# Patient Record
Sex: Female | Born: 1970 | Race: Black or African American | Hispanic: No | Smoking: Former smoker
Health system: Southern US, Community
[De-identification: ages and names within clinical notes are randomized; demographics above are authoritative.]

## PROBLEM LIST (undated history)

## (undated) DIAGNOSIS — I499 Cardiac arrhythmia, unspecified: Secondary | ICD-10-CM

## (undated) DIAGNOSIS — Z1509 Genetic susceptibility to other malignant neoplasm: Secondary | ICD-10-CM

## (undated) DIAGNOSIS — F32A Depression, unspecified: Secondary | ICD-10-CM

## (undated) DIAGNOSIS — D509 Iron deficiency anemia, unspecified: Secondary | ICD-10-CM

## (undated) DIAGNOSIS — Z973 Presence of spectacles and contact lenses: Secondary | ICD-10-CM

## (undated) DIAGNOSIS — R0602 Shortness of breath: Secondary | ICD-10-CM

## (undated) DIAGNOSIS — R21 Rash and other nonspecific skin eruption: Secondary | ICD-10-CM

## (undated) DIAGNOSIS — Z9889 Other specified postprocedural states: Secondary | ICD-10-CM

## (undated) DIAGNOSIS — Z8619 Personal history of other infectious and parasitic diseases: Secondary | ICD-10-CM

## (undated) DIAGNOSIS — Z9221 Personal history of antineoplastic chemotherapy: Secondary | ICD-10-CM

## (undated) DIAGNOSIS — Z9289 Personal history of other medical treatment: Secondary | ICD-10-CM

## (undated) DIAGNOSIS — F329 Major depressive disorder, single episode, unspecified: Secondary | ICD-10-CM

## (undated) DIAGNOSIS — R079 Chest pain, unspecified: Secondary | ICD-10-CM

## (undated) DIAGNOSIS — K219 Gastro-esophageal reflux disease without esophagitis: Secondary | ICD-10-CM

## (undated) DIAGNOSIS — N809 Endometriosis, unspecified: Secondary | ICD-10-CM

## (undated) DIAGNOSIS — I82409 Acute embolism and thrombosis of unspecified deep veins of unspecified lower extremity: Secondary | ICD-10-CM

## (undated) DIAGNOSIS — D219 Benign neoplasm of connective and other soft tissue, unspecified: Secondary | ICD-10-CM

## (undated) DIAGNOSIS — C50919 Malignant neoplasm of unspecified site of unspecified female breast: Secondary | ICD-10-CM

## (undated) DIAGNOSIS — D649 Anemia, unspecified: Secondary | ICD-10-CM

## (undated) DIAGNOSIS — I10 Essential (primary) hypertension: Secondary | ICD-10-CM

## (undated) DIAGNOSIS — Z1501 Genetic susceptibility to malignant neoplasm of breast: Secondary | ICD-10-CM

## (undated) DIAGNOSIS — R112 Nausea with vomiting, unspecified: Secondary | ICD-10-CM

## (undated) HISTORY — PX: CARDIAC CATHETERIZATION: SHX172

## (undated) HISTORY — DX: Malignant neoplasm of unspecified site of unspecified female breast: C50.919

## (undated) HISTORY — DX: Genetic susceptibility to other malignant neoplasm: Z15.09

## (undated) HISTORY — DX: Iron deficiency anemia, unspecified: D50.9

## (undated) HISTORY — PX: TUBAL LIGATION: SHX77

## (undated) HISTORY — DX: Essential (primary) hypertension: I10

## (undated) HISTORY — DX: Genetic susceptibility to malignant neoplasm of breast: Z15.01

## (undated) HISTORY — PX: MASTECTOMY: SHX3

---

## 2008-12-03 HISTORY — PX: CERVICAL POLYPECTOMY: SHX88

## 2012-06-09 ENCOUNTER — Emergency Department (HOSPITAL_COMMUNITY)
Admission: EM | Admit: 2012-06-09 | Discharge: 2012-06-09 | Disposition: A | Discharge status: Home / Self Care | Payer: Self-pay | Attending: Emergency Medicine | Admitting: Emergency Medicine

## 2012-06-09 ENCOUNTER — Encounter (HOSPITAL_COMMUNITY): Payer: Self-pay | Admitting: *Deleted

## 2012-06-09 ENCOUNTER — Emergency Department (HOSPITAL_COMMUNITY): Payer: Self-pay

## 2012-06-09 DIAGNOSIS — Z111 Encounter for screening for respiratory tuberculosis: Secondary | ICD-10-CM | POA: Insufficient documentation

## 2012-06-09 NOTE — ED Notes (Signed)
She had had a pos tb test and her job in [place] told her to come here for a chest xray

## 2012-06-09 NOTE — ED Provider Notes (Signed)
History     CSN: [id number]  Arrival date & time 06/09/12  2158   First MD Initiated Contact with Patient 06/09/12 2337      Chief Complaint  Patient presents with  . needs chest xray     (Consider location/radiation/quality/duration/timing/severity/associated sxs/prior treatment) HPI Comments: Patient had a screening PPD placed at her job is read today as positive.  It is minimally red non-raised.  She has had no cough, no night sweats.  Has not traveled outside of the country, but they insisted she come to the emergency room tonight for chest x-ray to rule out tuberculosis  The history is provided by the patient.    History reviewed. No pertinent past medical history.  History reviewed. No pertinent past surgical history.  No family history on file.  History  Substance Use Topics  . Smoking status: Never Smoker   . Smokeless tobacco: Not on file  . Alcohol Use: No    OB History    Grav Para Term Preterm Abortions TAB SAB Ect Mult Living                  Review of Systems  Constitutional: Negative for fever and chills.  Respiratory: Negative for cough and shortness of breath.   Neurological: Negative for dizziness.    Allergies  Review of patient's allergies indicates no known allergies.  Home Medications  No current outpatient prescriptions on file.  BP 118/84  Pulse 70  Temp 98.1 F (36.7 C) (Oral)  Resp 18  SpO2 100%  LMP 05/28/2012  Physical Exam  Constitutional: She appears well-developed and well-nourished.  HENT:  Head: Normocephalic.  Eyes: Pupils are equal, round, and reactive to light.  Neck: Normal range of motion.  Cardiovascular: Normal rate.   Pulmonary/Chest: Effort normal.  Musculoskeletal: Normal range of motion.  Neurological: She is alert.  Skin: Skin is warm.       Minimally P., 1 cm, circular nonraised area at the site of the PPD test    ED Course  Procedures (including critical care time)   Labs Reviewed  PREGNANCY,  URINE  POCT PREGNANCY, URINE   Dg Chest 1 View  06/09/2012  *RADIOLOGY REPORT*  Clinical Data: Positive TB test  CHEST - 1 VIEW  Comparison: None.  Findings: Upper normal heart size.  Clear lungs.  No pleural effusion.  IMPRESSION: No active cardiopulmonary disease.  Original Report Authenticated By: Donavan Burnet, M.D.     1. Screening for tuberculosis       MDM   Chest x-ray to rule out tuberculosis with a minimally positive PPD per her employer        Arman Filter, NP 06/09/12 2342  Arman Filter, NP 06/09/12 2342

## 2012-06-10 NOTE — ED Provider Notes (Signed)
Medical screening examination/treatment/procedure(s) were performed by non-physician practitioner and as supervising physician I was immediately available for consultation/collaboration.    Evalina Tabak D Francisca Langenderfer, MD 06/10/12 0730 

## 2012-09-11 ENCOUNTER — Emergency Department (HOSPITAL_BASED_OUTPATIENT_CLINIC_OR_DEPARTMENT_OTHER)
Admission: EM | Admit: 2012-09-11 | Discharge: 2012-09-11 | Disposition: A | Discharge status: Home / Self Care | Payer: Self-pay | Attending: Emergency Medicine | Admitting: Emergency Medicine

## 2012-09-11 ENCOUNTER — Encounter (HOSPITAL_BASED_OUTPATIENT_CLINIC_OR_DEPARTMENT_OTHER): Payer: Self-pay | Admitting: *Deleted

## 2012-09-11 DIAGNOSIS — L02419 Cutaneous abscess of limb, unspecified: Secondary | ICD-10-CM | POA: Insufficient documentation

## 2012-09-11 DIAGNOSIS — L0291 Cutaneous abscess, unspecified: Secondary | ICD-10-CM

## 2012-09-11 MED ORDER — HYDROCODONE-ACETAMINOPHEN 5-325 MG PO TABS: 2.0000 | ORAL_TABLET | ORAL | Status: AC | PRN

## 2012-09-11 MED ORDER — DOXYCYCLINE HYCLATE 100 MG PO CAPS: 100.0000 mg | ORAL_CAPSULE | Freq: Two times a day (BID) | ORAL | Status: DC

## 2012-09-11 NOTE — ED Provider Notes (Signed)
Medical screening examination/treatment/procedure(s) were performed by non-physician practitioner and as supervising physician I was immediately available for consultation/collaboration.   Glynn Octave, MD 09/11/12 [phone]

## 2012-09-11 NOTE — ED Notes (Signed)
Abscess to her right upper anterior leg x 2 days. Red, swollen, painful and hot to touch.

## 2012-09-11 NOTE — ED Provider Notes (Signed)
History     CSN: [id number]  Arrival date & time 09/11/12  1825   First MD Initiated Contact with Patient 09/11/12 2024      Chief Complaint  Patient presents with  . Abscess    (Consider location/radiation/quality/duration/timing/severity/associated sxs/prior treatment) Patient is a 41 y.o. female presenting with rash. The history is provided by the patient. No language interpreter was used.  Rash  This is a new problem. The problem has not changed since onset.The rash is present on the right upper leg. The pain is moderate.  Pt complains of a red area to right upper leg.    History reviewed. No pertinent past medical history.  History reviewed. No pertinent past surgical history.  No family history on file.  History  Substance Use Topics  . Smoking status: Never Smoker   . Smokeless tobacco: Not on file  . Alcohol Use: No    OB History    Grav Para Term Preterm Abortions TAB SAB Ect Mult Living                  Review of Systems  Skin: Positive for rash.  All other systems reviewed and are negative.    Allergies  Review of patient's allergies indicates no known allergies.  Home Medications  No current outpatient prescriptions on file.  BP 146/79  Pulse 78  Temp 98.8 F (37.1 C) (Oral)  Resp 16  SpO2 99%  Physical Exam  Nursing note and vitals reviewed. Constitutional: She is oriented to person, place, and time. She appears well-developed and well-nourished.  Musculoskeletal: She exhibits tenderness.       3cm erythematous area right leg,  nonfluctuant  Neurological: She is alert and oriented to person, place, and time. She has normal reflexes.  Skin: There is erythema.  Psychiatric: She has a normal mood and affect.    ED Course  Procedures (including critical care time)  Labs Reviewed - No data to display No results found.   1. Abscess       MDM  Doxy cycline and hydrocodone        Lonia Skinner [place], [place] 09/11/12 2222

## 2013-12-20 ENCOUNTER — Emergency Department (HOSPITAL_BASED_OUTPATIENT_CLINIC_OR_DEPARTMENT_OTHER)
Admission: EM | Admit: 2013-12-20 | Discharge: 2013-12-20 | Disposition: A | Discharge status: Home / Self Care | Payer: Medicaid Other | Attending: Emergency Medicine | Admitting: Emergency Medicine

## 2013-12-20 ENCOUNTER — Encounter (HOSPITAL_BASED_OUTPATIENT_CLINIC_OR_DEPARTMENT_OTHER): Payer: Self-pay | Admitting: Emergency Medicine

## 2013-12-20 DIAGNOSIS — N6320 Unspecified lump in the left breast, unspecified quadrant: Secondary | ICD-10-CM

## 2013-12-20 DIAGNOSIS — N63 Unspecified lump in unspecified breast: Secondary | ICD-10-CM | POA: Insufficient documentation

## 2013-12-20 MED ORDER — OXYCODONE-ACETAMINOPHEN 5-325 MG PO TABS: 1.0000 | ORAL_TABLET | ORAL | Status: DC | PRN

## 2013-12-20 MED ORDER — CEPHALEXIN 500 MG PO CAPS: 500.0000 mg | ORAL_CAPSULE | Freq: Four times a day (QID) | ORAL | Status: DC

## 2013-12-20 NOTE — ED Notes (Addendum)
Patient here with left breast pain x 1 day. Reports that she can feel lump inside her breast. On assessment no redness or firmness noted

## 2013-12-20 NOTE — ED Provider Notes (Signed)
CSN: [id number]     Arrival date & time 12/20/13  1538 History   First MD Initiated Contact with Patient 12/20/13 1712     Chief Complaint  Patient presents with  . Breast Pain   (Consider location/radiation/quality/duration/timing/severity/associated sxs/prior Treatment) The history is provided by the patient.   Tamara [name] is a 43 y.o. female who presents to the ED with left breast pain that started last night. She felt the pain and then felt a knot. She also complains of pain in the left axilla. She has never had a mammogram. Patient's mother with hx of breast CA. Patient has no health problems. She denies fever or chills or any other problems.  History reviewed. No pertinent past medical history. Past Surgical History  Procedure Laterality Date  . Palups removed      2010   No family history on file. History  Substance Use Topics  . Smoking status: Never Smoker   . Smokeless tobacco: Not on file  . Alcohol Use: No   OB History   Grav Para Term Preterm Abortions TAB SAB Ect Mult Living                 Review of Systems Negative except as stated in HPI.   Allergies  Review of patient's allergies indicates no known allergies.  Home Medications   Current Outpatient Rx  Name  Route  Sig  Dispense  Refill  . cephALEXin (KEFLEX) 500 MG capsule   Oral   Take 1 capsule (500 mg total) by mouth 4 (four) times daily.   28 capsule   0   . oxyCODONE-acetaminophen (ROXICET) 5-325 MG per tablet   Oral   Take 1 tablet by mouth every 4 (four) hours as needed for severe pain.   16 tablet   0    BP 151/59  Pulse 86  Temp(Src) 99.4 F (37.4 C) (Oral)  Resp 18  Wt 195 lb (88.451 kg)  SpO2 100% Physical Exam  Nursing note and vitals reviewed. Constitutional: She is oriented to person, place, and time. She appears well-developed and well-nourished. No distress.  HENT:  Head: Normocephalic and atraumatic.  Eyes: EOM are normal.  Neck: Neck supple.  Cardiovascular:  Normal rate.   Pulmonary/Chest: Effort normal. Left breast exhibits mass and tenderness. Left breast exhibits no inverted nipple, no nipple discharge and no skin change.    4 cm x 2 cm tender, firm mass palpated in the left breast. Tenderness of left axilla. No erythema noted.  Abdominal: Soft. There is no tenderness.  Musculoskeletal: Normal range of motion.  Neurological: She is alert and oriented to person, place, and time. No cranial nerve deficit.  Skin: Skin is warm and dry.  Psychiatric: She has a normal mood and affect. Her behavior is normal.    ED Course  Procedures  MDM  43 y.o. female with left breast mass, axilla tenderness. Will have her call [facility] tomorrow to be seen in the acute care clinic and she will call [facility] for mammogram. Discussed with the patient clinical findings and plan of care and all questioned fully answered. Will start antibiotics in the event that the area is an abscess. She will return if any problems arise.    Medication List         cephALEXin 500 MG capsule  Commonly known as:  KEFLEX  Take 1 capsule (500 mg total) by mouth 4 (four) times daily.     oxyCODONE-acetaminophen 5-325 MG  per tablet  Commonly known as:  ROXICET  Take 1 tablet by mouth every 4 (four) hours as needed for severe pain.           Ashley Murrain, NP 12/21/13 1728

## 2013-12-20 NOTE — Discharge Instructions (Signed)
Call [facility] tomorrow. Tell them you were seen in the ED and need to come to their work in clinic. Take the medications as directed. Do not take the narcotic if you are driving as it will make you sleepy. Return here as needed.

## 2013-12-21 ENCOUNTER — Ambulatory Visit: Payer: Medicaid Other | Attending: Internal Medicine | Admitting: Internal Medicine

## 2013-12-21 ENCOUNTER — Encounter: Payer: Self-pay | Admitting: Internal Medicine

## 2013-12-21 VITALS — BP 148/84 | HR 85 | Temp 98.9°F | Resp 14 | Ht 65.0 in | Wt 195.6 lb

## 2013-12-21 DIAGNOSIS — N63 Unspecified lump in unspecified breast: Secondary | ICD-10-CM

## 2013-12-21 LAB — CBC WITH DIFFERENTIAL/PLATELET
BASOS ABS: 0 10*3/uL (ref 0.0–0.1)
BASOS PCT: 0 % (ref 0–1)
Eosinophils Absolute: 0.1 10*3/uL (ref 0.0–0.7)
Eosinophils Relative: 1 % (ref 0–5)
HEMATOCRIT: 27.5 % — AB (ref 36.0–46.0)
Hemoglobin: 8 g/dL — ABNORMAL LOW (ref 12.0–15.0)
LYMPHS PCT: 27 % (ref 12–46)
Lymphs Abs: 2.2 10*3/uL (ref 0.7–4.0)
MCH: 17.9 pg — ABNORMAL LOW (ref 26.0–34.0)
MCHC: 29.1 g/dL — AB (ref 30.0–36.0)
MCV: 61.7 fL — ABNORMAL LOW (ref 78.0–100.0)
Monocytes Absolute: 0.6 10*3/uL (ref 0.1–1.0)
Monocytes Relative: 7 % (ref 3–12)
NEUTROS ABS: 5.1 10*3/uL (ref 1.7–7.7)
NEUTROS PCT: 65 % (ref 43–77)
PLATELETS: 425 10*3/uL — AB (ref 150–400)
RBC: 4.46 MIL/uL (ref 3.87–5.11)
RDW: 19 % — AB (ref 11.5–15.5)
WBC: 8 10*3/uL (ref 4.0–10.5)

## 2013-12-21 NOTE — Progress Notes (Signed)
Patient ID: Tamara [name], female   DOB: 02-12-1971, 43 y.o.   MRN: [id number]   CC:  HPI: 43 year old female presents with a lump in the outer quadrant of her left breast. She has noted this for the last 2 months. States that it is painful, with pain radiating into the left axilla. She has a strong positive family history of breast cancer   No Known Allergies History reviewed. No pertinent past medical history. Current Outpatient Prescriptions on File Prior to Visit  Medication Sig Dispense Refill  . cephALEXin (KEFLEX) 500 MG capsule Take 1 capsule (500 mg total) by mouth 4 (four) times daily.  28 capsule  0  . oxyCODONE-acetaminophen (ROXICET) 5-325 MG per tablet Take 1 tablet by mouth every 4 (four) hours as needed for severe pain.  16 tablet  0   No current facility-administered medications on file prior to visit.   History reviewed. No pertinent family history. History   Social History  . Marital Status: Single    Spouse Name: N/A    Number of Children: N/A  . Years of Education: N/A   Occupational History  . Not on file.   Social History Main Topics  . Smoking status: Never Smoker   . Smokeless tobacco: Not on file  . Alcohol Use: No  . Drug Use: No  . Sexual Activity: Not on file   Other Topics Concern  . Not on file   Social History Narrative  . No narrative on file    Review of Systems  Constitutional: Negative for fever, chills, diaphoresis, activity change, appetite change and fatigue.  HENT: Negative for ear pain, nosebleeds, congestion, facial swelling, rhinorrhea, neck pain, neck stiffness and ear discharge.   Eyes: Negative for pain, discharge, redness, itching and visual disturbance.  Respiratory: Negative for cough, choking, chest tightness, shortness of breath, wheezing and stridor.   Cardiovascular: Negative for chest pain, palpitations and leg swelling.  Gastrointestinal: Negative for abdominal distention.  Genitourinary: Negative for dysuria,  urgency, frequency, hematuria, flank pain, decreased urine volume, difficulty urinating and dyspareunia.  Musculoskeletal: Negative for back pain, joint swelling, arthralgias and gait problem.  Neurological: Negative for dizziness, tremors, seizures, syncope, facial asymmetry, speech difficulty, weakness, light-headedness, numbness and headaches.  Hematological: Negative for adenopathy. Does not bruise/bleed easily.  Psychiatric/Behavioral: Negative for hallucinations, behavioral problems, confusion, dysphoric mood, decreased concentration and agitation.    Objective:   Filed Vitals:   12/21/13 1148  BP: 148/84  Pulse: 85  Temp: 98.9 F (37.2 C)  Resp: 14    Physical Exam  Constitutional: Appears well-developed and well-nourished. No distress.  HENT: Normocephalic. External right and left ear normal. Oropharynx is clear and moist.  Eyes: Conjunctivae and EOM are normal. PERRLA, no scleral icterus.  Neck: Normal ROM. Neck supple. No JVD. No tracheal deviation. No thyromegaly.  CVS: RRR, S1/S2 +, no murmurs, no gallops, no carotid bruit.  Pulmonary: Effort and breath sounds normal, no stridor, rhonchi, wheezes, rales.  Abdominal: Soft. BS +,  no distension, tenderness, rebound or guarding.  Musculoskeletal: Normal range of motion. No edema and no tenderness.  Lymphadenopathy: No lymphadenopathy noted, cervical, inguinal. Neuro: Alert. Normal reflexes, muscle tone coordination. No cranial nerve deficit. Skin: Skin is warm and dry. No rash noted. Not diaphoretic. No erythema. No pallor.  Psychiatric: Normal mood and affect. Behavior, judgment, thought content normal.   No results found for this basename: WBC, HGB, HCT, MCV, PLT   No results found for this basename: CREATININE, BUN, NA, K,  CL, CO2    No results found for this basename: HGBA1C   Lipid Panel  No results found for this basename: chol, trig, hdl, cholhdl, vldl, ldlcalc       Assessment and plan:   There are no  active problems to display for this patient.  Breast lump In the patient for a stat mammogram, surgical consultation CBC to rule out abscess Follow up in 2 weeks      The patient was given clear instructions to go to ER or return to medical center if symptoms don't improve, worsen or new problems develop. The patient verbalized understanding. The patient was told to call to get any lab results if not heard anything in the next week.

## 2013-12-21 NOTE — Progress Notes (Signed)
Pt is here to establish care and a breast exam. May have a lump on Lf breast. Wants to be checked out. Slight pain under Lf armpit and side of Lt breast.

## 2013-12-22 MED ORDER — FERROUS SULFATE 325 (65 FE) MG PO TABS: 325.0000 mg | ORAL_TABLET | Freq: Two times a day (BID) | ORAL | Status: DC

## 2013-12-22 NOTE — Addendum Note (Signed)
Addended by: Allyson Sabal MD, Ascencion Dike on: 12/22/2013 11:27 AM   Modules accepted: Orders

## 2013-12-22 NOTE — Addendum Note (Signed)
Addended by: Allyson Sabal MD, Ascencion Dike on: 12/22/2013 11:26 AM   Modules accepted: Orders

## 2013-12-23 ENCOUNTER — Other Ambulatory Visit: Payer: Self-pay | Admitting: Internal Medicine

## 2013-12-23 ENCOUNTER — Telehealth: Payer: Self-pay | Admitting: *Deleted

## 2013-12-23 DIAGNOSIS — N63 Unspecified lump in unspecified breast: Secondary | ICD-10-CM

## 2013-12-23 DIAGNOSIS — N644 Mastodynia: Secondary | ICD-10-CM

## 2013-12-23 NOTE — Telephone Encounter (Signed)
Pt is scheduled for lab work 12/24/13 at 9:15am.

## 2013-12-23 NOTE — Telephone Encounter (Signed)
Message copied by Roberts Bon, [place] R on Wed Dec 23, 2013  9:34 AM ------      Message from: Allyson Sabal MD, [facility]      Created: Tue Dec 22, 2013 11:28 AM       Notify patient of the patient is fairly anemic, she needs to return for lab work he immediately, lab work has been scheduled.      4 anemia the patient should start taking ferrous sulfate which has been called into clinic pharmacy ------

## 2013-12-24 ENCOUNTER — Ambulatory Visit: Payer: Medicaid Other | Attending: Internal Medicine

## 2013-12-24 DIAGNOSIS — R7989 Other specified abnormal findings of blood chemistry: Secondary | ICD-10-CM

## 2013-12-24 LAB — ANEMIA PANEL
ABS RETIC: 65.4 10*3/uL (ref 19.0–186.0)
FOLATE: 13.4 ng/mL
Iron: <10 ug/dL — ABNORMAL LOW (ref 42–145)
RBC.: 4.36 MIL/uL (ref 3.87–5.11)
RETIC CT PCT: 1.5 % (ref 0.4–2.3)
UIBC: 428 ug/dL — ABNORMAL HIGH (ref 125–400)
Vitamin B-12: 478 pg/mL (ref 211–911)

## 2013-12-26 ENCOUNTER — Telehealth: Payer: Self-pay | Admitting: Emergency Medicine

## 2013-12-26 NOTE — Telephone Encounter (Signed)
Left message for pt to call clinic for test results

## 2013-12-26 NOTE — Telephone Encounter (Signed)
Message copied by Ricci Barker on Sat Dec 26, 2013 10:05 AM ------      Message from: Tresa Garter      Created: Fri Dec 25, 2013  4:52 PM       Please inform patient that her anemia panel shows significant iron deficiency. She will need to be on iron supplement.      Please call in prescription for patient: Ferrous sulfate 325 mg tablet by mouth 2 times a day 180 tablets with 3 refills ------

## 2013-12-29 NOTE — ED Provider Notes (Signed)
History/physical exam/procedure(s) were performed by non-physician practitioner and as supervising physician I was immediately available for consultation/collaboration. I have reviewed all notes and am in agreement with care and plan.   Shaune Pollack, MD 12/29/13 [phone]

## 2013-12-30 ENCOUNTER — Other Ambulatory Visit: Payer: Self-pay

## 2014-01-04 ENCOUNTER — Other Ambulatory Visit (HOSPITAL_COMMUNITY): Payer: Self-pay | Admitting: *Deleted

## 2014-01-04 DIAGNOSIS — N6320 Unspecified lump in the left breast, unspecified quadrant: Secondary | ICD-10-CM

## 2014-01-05 ENCOUNTER — Ambulatory Visit (HOSPITAL_COMMUNITY): Payer: Self-pay

## 2014-01-08 ENCOUNTER — Other Ambulatory Visit: Payer: Self-pay

## 2014-01-12 ENCOUNTER — Encounter (HOSPITAL_COMMUNITY): Payer: Self-pay

## 2014-01-12 ENCOUNTER — Ambulatory Visit (HOSPITAL_COMMUNITY)
Admission: RE | Admit: 2014-01-12 | Discharge: 2014-01-12 | Disposition: A | Discharge status: Home / Self Care | Payer: Medicaid Other | Source: Ambulatory Visit | Attending: Obstetrics and Gynecology | Admitting: Obstetrics and Gynecology

## 2014-01-12 VITALS — BP 140/100 | Temp 98.6°F | Ht 65.5 in | Wt 192.9 lb

## 2014-01-12 DIAGNOSIS — Z1239 Encounter for other screening for malignant neoplasm of breast: Secondary | ICD-10-CM

## 2014-01-12 DIAGNOSIS — N6321 Unspecified lump in the left breast, upper outer quadrant: Secondary | ICD-10-CM | POA: Insufficient documentation

## 2014-01-12 HISTORY — DX: Endometriosis, unspecified: N80.9

## 2014-01-12 NOTE — Patient Instructions (Signed)
Taught Jacqulynn Shappell how to perform BSE and gave educational materials to take home. Patient did not need a Pap smear today due to last Pap smear was in August 2014 per patient. Let her know [facility] will cover Pap smears every 3 years unless has a history of abnormal Pap smears. Referred patient to the [facility] for diagnostic mammogram. Appointment scheduled for Monday, January 18, 2014 at 1415. Patient aware of appointment and will be there. Chellsea Beckers verbalized understanding.  Avaiah Stempel, Arvil Chaco, RN 1:56 PM

## 2014-01-12 NOTE — Progress Notes (Signed)
Complaints of left breast lump x several months that is painful at times. Patient rated pain at a 3 out of 10.  Pap Smear:  Pap smear not completed today. Last Pap smear was August 2014 at [place] and normal per patient. Per patient has no history of an abnormal Pap smear. No Pap smear results in EPIC.  Physical exam: Breasts Breasts symmetrical. No skin abnormalities bilateral breasts. No nipple retraction bilateral breasts. No nipple discharge bilateral breasts. No lymphadenopathy. No lumps palpated right breast. Palpated a lump within the left breast at 2 o'clock 4 cm from the areola. No complaints of pain or tenderness on exam. Referred patient to the [facility] for diagnostic mammogram. Appointment scheduled for Monday, January 18, 2014 at 1415.   Pelvic/Bimanual No Pap smear completed today since last Pap smear was August 2014 per patient. Pap smear not indicated per [facility] guidelines.

## 2014-01-18 ENCOUNTER — Ambulatory Visit
Admission: RE | Admit: 2014-01-18 | Discharge: 2014-01-18 | Disposition: A | Discharge status: Home / Self Care | Payer: Medicaid Other | Source: Ambulatory Visit | Attending: Obstetrics and Gynecology | Admitting: Obstetrics and Gynecology

## 2014-01-18 ENCOUNTER — Other Ambulatory Visit (HOSPITAL_COMMUNITY): Payer: Self-pay | Admitting: Obstetrics and Gynecology

## 2014-01-18 DIAGNOSIS — N6320 Unspecified lump in the left breast, unspecified quadrant: Secondary | ICD-10-CM

## 2014-01-19 ENCOUNTER — Ambulatory Visit: Payer: Self-pay | Admitting: Internal Medicine

## 2014-01-20 ENCOUNTER — Other Ambulatory Visit: Payer: Self-pay

## 2014-01-25 ENCOUNTER — Telehealth: Payer: Self-pay | Admitting: Emergency Medicine

## 2014-01-25 ENCOUNTER — Telehealth: Payer: Self-pay | Admitting: Internal Medicine

## 2014-01-25 NOTE — Telephone Encounter (Signed)
Spoke with pt regarding mammogram results. Pt was informed of finding at time of visit but had some concerns regarding left axillary lymph node. Pt informed to wait until U/S with biopsy imaging

## 2014-01-25 NOTE — Telephone Encounter (Signed)
Pt is calling today to get some advice about the results of her breast exam; pt is worried and would like to speak to a physician about her exam; please advise on pt cell

## 2014-01-28 ENCOUNTER — Other Ambulatory Visit: Payer: Self-pay

## 2014-01-28 ENCOUNTER — Telehealth: Payer: Self-pay

## 2014-01-28 NOTE — Telephone Encounter (Signed)
Called the patient to see if she would be able to make the appointment the morning or if needed to reschedule due to weather. Per patient she would be able to come in the morning. Explained were to come and what would occur.

## 2014-01-29 ENCOUNTER — Ambulatory Visit (HOSPITAL_BASED_OUTPATIENT_CLINIC_OR_DEPARTMENT_OTHER): Payer: Medicaid Other

## 2014-01-29 ENCOUNTER — Other Ambulatory Visit (HOSPITAL_COMMUNITY): Payer: Self-pay | Admitting: Obstetrics and Gynecology

## 2014-01-29 ENCOUNTER — Ambulatory Visit
Admission: RE | Admit: 2014-01-29 | Discharge: 2014-01-29 | Disposition: A | Discharge status: Home / Self Care | Payer: Medicaid Other | Source: Ambulatory Visit | Attending: Obstetrics and Gynecology | Admitting: Obstetrics and Gynecology

## 2014-01-29 ENCOUNTER — Other Ambulatory Visit: Payer: Medicaid Other

## 2014-01-29 ENCOUNTER — Ambulatory Visit
Admission: RE | Admit: 2014-01-29 | Discharge: 2014-01-29 | Disposition: A | Discharge status: Home / Self Care | Payer: No Typology Code available for payment source | Source: Ambulatory Visit | Attending: Obstetrics and Gynecology | Admitting: Obstetrics and Gynecology

## 2014-01-29 ENCOUNTER — Telehealth: Payer: Self-pay | Admitting: Internal Medicine

## 2014-01-29 ENCOUNTER — Other Ambulatory Visit: Payer: Self-pay

## 2014-01-29 ENCOUNTER — Telehealth: Payer: Self-pay

## 2014-01-29 VITALS — BP 150/108 | HR 63 | Temp 98.7°F | Resp 20 | Ht 64.0 in | Wt 193.9 lb

## 2014-01-29 DIAGNOSIS — N6320 Unspecified lump in the left breast, unspecified quadrant: Secondary | ICD-10-CM

## 2014-01-29 DIAGNOSIS — Z0000 Encounter for general adult medical examination without abnormal findings: Secondary | ICD-10-CM

## 2014-01-29 LAB — GLUCOSE (CC13): GLUCOSE: 90 mg/dL (ref 70–140)

## 2014-01-29 LAB — LIPID PANEL
Cholesterol: 165 mg/dL (ref 0–200)
HDL: 65 mg/dL (ref >39)
LDL Cholesterol: 87 mg/dL (ref 0–99)
TRIGLYCERIDES: 65 mg/dL (ref <150)
Total CHOL/HDL Ratio: 2.5 Ratio
VLDL: 13 mg/dL (ref 0–40)

## 2014-01-29 LAB — HEMOGLOBIN A1C
Hgb A1c MFr Bld: 5 % (ref <5.7)
Mean Plasma Glucose: 97 mg/dL (ref <117)

## 2014-01-29 NOTE — Patient Instructions (Signed)
Discussed health assessment with patient. Informed patient that she would need to be followed up for blood pressure. She will be called with results of lab work and we will then discussed any further follow up the patient needs. Patient will implement behavior modifications to help lower BP.  Patient voiced interest in working on Nutrition and Activity. WIll f/u at [facility] for Bp and [facility] for Biopsy.Patient verbalized understanding.

## 2014-01-29 NOTE — Telephone Encounter (Signed)
Called patient to inform her that she had an appointment at the [facility] today(2/27) at 2:30 pm for her guided biopsy. She stated that she was glad and would be there.

## 2014-01-29 NOTE — Progress Notes (Signed)
Patient is a new patient to the [facility] program and is currently a [facility] patient effective 01/12/2014.   Clinical Measurements: Patient is 5 ft. 4 inches, weight 193.9 lbs, waist circumference 32 inches, and hip circumference 47 inches.   Medical History: Patient has no history of high cholesterol. Patient does not have a history of hypertension or diabetes. Per patient no diagnosed history of coronary heart disease, heart attack, heart failure, stroke/TIA, vascular disease or congenital heart defects.   Blood Pressure, Self-measurement: Patient states has no reason to check Blood pressure. 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  Nutrition Assessment: Patient stated that she has plenty of fruits in the house but she may eat it once a week.The children eat fruit every day. Patient states she eats  2 servings of vegetables a day. Patient stated that loves string beans. She does not eat 2 or more ounces of whole grains daily. Patient doesn't eat two or more servings of fish weekly.  Patient states she does drink more than 36 ounces or 450 calories of beverages with added sugars weekly. Patient stated she does not watch her salt intake.   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 Per patient does not go to mall because does most of her shopping on line. Patient stated that can not climb stairs or inclines without getting short of breath. This has been going on for the past 2 years.  Smoking Status: Patient states that she quit smoking about 4 to 5 years ago. Per patient was not a heavy smoker. Per patient is not around people who smoke in closed in areas.  Quality of Life Assessment: In assessing patient's quality of life she stated that out of the past 30 days that she has felt her health is not good all of them. Patient also stated that in the past 30 days that her mental  health is not good including stress, depression and problems with emotions for 30 days. Patients states that is stress do to lump in breast. The biopsy has been postpone twice due to weather. Patient did state that out of the past 30 days she felt her physical or mental health had not kept her from doing her usual activities including self-care, work or recreation. Patient voiced a lot of concern of being anxious and stressed over biopsy having been cancelled twice dur to weather.  Plan: Lab work will be done today including a lipid panel, blood glucose, and Hgb A1C. Will call lab results when they are finished. Patient will returned for Health Coaching and BP check. Will set up appointment for Health Coaching on 02/01/14.Will work on behavior modifications that we discussed to lower BP. Will work on methods to reduce stress that we discussed. Will schedule for an appointment at [facility] concerning blood pressure.  Will call if has any questions or concerns

## 2014-01-29 NOTE — Telephone Encounter (Signed)
Called pt and left a voicemail for pt to call and schedule an appointment with [facility], concerning her BP

## 2014-02-01 ENCOUNTER — Telehealth: Payer: Self-pay

## 2014-02-01 DIAGNOSIS — C50919 Malignant neoplasm of unspecified site of unspecified female breast: Secondary | ICD-10-CM

## 2014-02-01 HISTORY — DX: Malignant neoplasm of unspecified site of unspecified female breast: C50.919

## 2014-02-01 NOTE — Telephone Encounter (Signed)
Called and left message for her to return phone call so that I could give lab results to her.

## 2014-02-01 NOTE — Telephone Encounter (Signed)
Tamara [name] called back. Informed her that her results were normal. Told her that [facility] was trying to reach concerning an appointment about her Blood Pressure.  PLAN: Call back about Health Coaching for nutrition and activity that patient stated wanted to do.

## 2014-02-02 ENCOUNTER — Other Ambulatory Visit (INDEPENDENT_AMBULATORY_CARE_PROVIDER_SITE_OTHER): Payer: Self-pay | Admitting: Surgery

## 2014-02-02 ENCOUNTER — Telehealth: Payer: Self-pay | Admitting: *Deleted

## 2014-02-02 DIAGNOSIS — C50412 Malignant neoplasm of upper-outer quadrant of left female breast: Secondary | ICD-10-CM | POA: Insufficient documentation

## 2014-02-02 DIAGNOSIS — C50919 Malignant neoplasm of unspecified site of unspecified female breast: Secondary | ICD-10-CM

## 2014-02-02 DIAGNOSIS — Z171 Estrogen receptor negative status [ER-]: Secondary | ICD-10-CM

## 2014-02-02 NOTE — Telephone Encounter (Signed)
Confirmed [facility] for 02/10/14 at 8am .  Instructions and contact information given.

## 2014-02-04 ENCOUNTER — Ambulatory Visit: Payer: Medicaid Other | Attending: Internal Medicine

## 2014-02-04 MED ORDER — HYDROCHLOROTHIAZIDE 25 MG PO TABS: 25.0000 mg | ORAL_TABLET | Freq: Every day | ORAL | Status: DC

## 2014-02-04 NOTE — Patient Instructions (Signed)
Pt instructed to take new prescribed HCTZ 25 mg daily and return in 1 mnth for repeat bp

## 2014-02-07 ENCOUNTER — Ambulatory Visit
Admission: RE | Admit: 2014-02-07 | Discharge: 2014-02-07 | Disposition: A | Discharge status: Home / Self Care | Payer: No Typology Code available for payment source | Source: Ambulatory Visit | Attending: Surgery | Admitting: Surgery

## 2014-02-07 DIAGNOSIS — C50919 Malignant neoplasm of unspecified site of unspecified female breast: Secondary | ICD-10-CM

## 2014-02-07 MED ORDER — GADOBENATE DIMEGLUMINE 529 MG/ML IV SOLN
18.0000 mL | Freq: Once | INTRAVENOUS | Status: AC | PRN
  Administered 2014-02-07: 18 mL via INTRAVENOUS

## 2014-02-08 ENCOUNTER — Other Ambulatory Visit (INDEPENDENT_AMBULATORY_CARE_PROVIDER_SITE_OTHER): Payer: Self-pay | Admitting: Surgery

## 2014-02-08 DIAGNOSIS — R928 Other abnormal and inconclusive findings on diagnostic imaging of breast: Secondary | ICD-10-CM

## 2014-02-10 ENCOUNTER — Ambulatory Visit (HOSPITAL_BASED_OUTPATIENT_CLINIC_OR_DEPARTMENT_OTHER): Payer: Medicaid Other | Admitting: Oncology

## 2014-02-10 ENCOUNTER — Ambulatory Visit (HOSPITAL_BASED_OUTPATIENT_CLINIC_OR_DEPARTMENT_OTHER): Payer: Medicaid Other | Admitting: Surgery

## 2014-02-10 ENCOUNTER — Encounter (INDEPENDENT_AMBULATORY_CARE_PROVIDER_SITE_OTHER): Payer: Self-pay

## 2014-02-10 ENCOUNTER — Encounter: Payer: Self-pay | Admitting: Oncology

## 2014-02-10 ENCOUNTER — Ambulatory Visit: Payer: Medicaid Other | Attending: Surgery | Admitting: Physical Therapy

## 2014-02-10 ENCOUNTER — Encounter: Payer: Self-pay | Admitting: Radiation Oncology

## 2014-02-10 ENCOUNTER — Ambulatory Visit: Payer: Medicaid Other

## 2014-02-10 ENCOUNTER — Ambulatory Visit
Admission: RE | Admit: 2014-02-10 | Discharge: 2014-02-10 | Disposition: A | Discharge status: Home / Self Care | Payer: PRIVATE HEALTH INSURANCE | Source: Ambulatory Visit | Attending: Radiation Oncology | Admitting: Radiation Oncology

## 2014-02-10 ENCOUNTER — Other Ambulatory Visit (HOSPITAL_BASED_OUTPATIENT_CLINIC_OR_DEPARTMENT_OTHER): Payer: Medicaid Other

## 2014-02-10 ENCOUNTER — Encounter (INDEPENDENT_AMBULATORY_CARE_PROVIDER_SITE_OTHER): Payer: Self-pay | Admitting: Surgery

## 2014-02-10 VITALS — BP 134/84 | HR 80 | Temp 98.5°F | Resp 18 | Ht 64.0 in | Wt 194.3 lb

## 2014-02-10 DIAGNOSIS — Z803 Family history of malignant neoplasm of breast: Secondary | ICD-10-CM

## 2014-02-10 DIAGNOSIS — C50419 Malignant neoplasm of upper-outer quadrant of unspecified female breast: Secondary | ICD-10-CM

## 2014-02-10 DIAGNOSIS — C50412 Malignant neoplasm of upper-outer quadrant of left female breast: Secondary | ICD-10-CM

## 2014-02-10 DIAGNOSIS — R293 Abnormal posture: Secondary | ICD-10-CM | POA: Insufficient documentation

## 2014-02-10 DIAGNOSIS — C50919 Malignant neoplasm of unspecified site of unspecified female breast: Secondary | ICD-10-CM | POA: Insufficient documentation

## 2014-02-10 DIAGNOSIS — Z171 Estrogen receptor negative status [ER-]: Secondary | ICD-10-CM

## 2014-02-10 DIAGNOSIS — C773 Secondary and unspecified malignant neoplasm of axilla and upper limb lymph nodes: Secondary | ICD-10-CM

## 2014-02-10 DIAGNOSIS — IMO0001 Reserved for inherently not codable concepts without codable children: Secondary | ICD-10-CM | POA: Insufficient documentation

## 2014-02-10 LAB — CBC WITH DIFFERENTIAL/PLATELET
BASO%: 1.7 % (ref 0.0–2.0)
Basophils Absolute: 0.2 10*3/uL — ABNORMAL HIGH (ref 0.0–0.1)
EOS%: 1.1 % (ref 0.0–7.0)
Eosinophils Absolute: 0.1 10*3/uL (ref 0.0–0.5)
HCT: 27.7 % — ABNORMAL LOW (ref 34.8–46.6)
HGB: 8.3 g/dL — ABNORMAL LOW (ref 11.6–15.9)
LYMPH%: 23.9 % (ref 14.0–49.7)
MCH: 18.1 pg — AB (ref 25.1–34.0)
MCHC: 29.8 g/dL — AB (ref 31.5–36.0)
MCV: 60.6 fL — AB (ref 79.5–101.0)
MONO#: 0.9 10*3/uL (ref 0.1–0.9)
MONO%: 9.8 % (ref 0.0–14.0)
NEUT#: 5.9 10*3/uL (ref 1.5–6.5)
NEUT%: 63.5 % (ref 38.4–76.8)
PLATELETS: 487 10*3/uL — AB (ref 145–400)
RBC: 4.57 10*6/uL (ref 3.70–5.45)
RDW: 19.8 % — ABNORMAL HIGH (ref 11.2–14.5)
WBC: 9.2 10*3/uL (ref 3.9–10.3)
lymph#: 2.2 10*3/uL (ref 0.9–3.3)

## 2014-02-10 LAB — COMPREHENSIVE METABOLIC PANEL (CC13)
ALT: 11 U/L (ref 0–55)
AST: 17 U/L (ref 5–34)
Albumin: 4 g/dL (ref 3.5–5.0)
Alkaline Phosphatase: 65 U/L (ref 40–150)
Anion Gap: 10 mEq/L (ref 3–11)
BILIRUBIN TOTAL: 0.23 mg/dL (ref 0.20–1.20)
BUN: 20.5 mg/dL (ref 7.0–26.0)
CO2: 22 mEq/L (ref 22–29)
CREATININE: 0.9 mg/dL (ref 0.6–1.1)
Calcium: 9.5 mg/dL (ref 8.4–10.4)
Chloride: 106 mEq/L (ref 98–109)
Glucose: 90 mg/dl (ref 70–140)
Potassium: 4 mEq/L (ref 3.5–5.1)
Sodium: 137 mEq/L (ref 136–145)
TOTAL PROTEIN: 7.9 g/dL (ref 6.4–8.3)

## 2014-02-10 NOTE — Patient Instructions (Signed)
Implanted Port Insertion An implanted port is a central line that has a round shape and is placed under the skin. It is used as a long-term IV access for:   Medicines, such as chemotherapy.   Fluids.   Liquid nutrition, such as total parenteral nutrition (TPN).   Blood samples.  LET YOUR HEALTH CARE PROVIDER KNOW ABOUT:  Allergies to food or medicine.   Medicines taken, including vitamins, herbs, eye drops, creams, and over-the-counter medicines.   Any allergies to heparin.  Use of steroids (by mouth or creams).   Previous problems with anesthetics or numbing medicines.   History of bleeding problems or blood clots.   Previous surgery.   Other health problems, including diabetes and kidney problems.   Possibility of pregnancy, if this applies. RISKS AND COMPLICATIONS  Damage to the blood vessel, bruising, or bleeding at the puncture site.   Infection.  Blood clot in the vessel that the port is in.  Breakdown of the skin over your port.  Very rarely a person may develop a condition called a pneumothorax, a collection of air in the chest that may cause one of the lungs to collapse. The placement of these catheters with the appropriate imaging guidance significantly decreases the risk of a pneumothorax.  BEFORE THE PROCEDURE   Your health care provider may want you to have blood tests. These tests can help tell how well your kidneys and liver are working. They can also show how well your blood clots.   If you take blood thinners (anticoagulant medicines), ask your health care provider when you should stop taking them.   Make arrangements for someone to drive you home. This is necessary if you have been sedated for your procedure.  PROCEDURE  Port insertion usually takes about 30 45 minutes.   An IV needle will be inserted in your arm. Medicine for pain and medicine to help relax you (sedative) will flow directly into your body through this needle.    You will lie on an exam table, and you will be connected to monitors to keep track of your heart rate, blood pressure, and breathing throughout the procedure.  An oxygen monitoring device may be attached to your finger. Oxygen will be given.   Everything is kept as germ free (sterile) as possible during the procedure. The skin near the point of the incision will be cleaned with antiseptic, and the area will be draped with sterile towels. The skin and deeper tissues over the port area will be made numb with a local anesthetic.  Two small cuts (incisions) will be made in the skin to insert the port. One will be made in the neck to obtain access to the vein where the catheter will lie.   Because the port reservoir is placed under the skin, a small skin incision is made in the upper chest, and a small pocket for the port is made under the skin. The catheter connected to the port tunnels to a large central vein in the chest. A small, raised area remains on your body at the site of the reservoir when the procedure is complete.  The port placement is done under imaging guidance to ensure the proper placement.  The reservoir has a silicone covering that can be punctured with a special needle.   The port is flushed with normal saline, and blood is drawn to make sure it is working properly.  There is nothing remaining outside the skin when the procedure is finished.     Incisions are held together by stitches, surgical glue, or a special tape.  AFTER THE PROCEDURE  You will stay in a recovery area until the anesthesia has worn off. Your blood pressure and pulse will be checked.  A final chest X-ray is taken to check the placement of the port and that there is no injury to your lung.  If there are no problems, you should be able to go home after the procedure.  Document Released: 09/09/2013 Document Reviewed: 07/27/2013 [employer] Patient Information 2014 [employer]  

## 2014-02-10 NOTE — Progress Notes (Signed)
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 female who is seen out of the courtesy of Dr. Erroll Luna as part of the multidisciplinary breast clinic. Patient presented recently with palpable mass in the upper-outer aspect of her left breast. She sought out medical attention. A digital diagnostic bilateral mammogram and ultrasound was performed which revealed an ill-defined mass located within the upper-outer quadrant of the left breast. On ultrasound a hypoechoic mass was noted in the left breast, 2:00 position 7 cm from the nipple. This measured 3.2 x 2.6 x 2.0 cm. Results noted to be a level I left axillary lymph node with focal cortical thickening and increased vascularity  corresponding to a palpable lymph node. This measured 2.1 cm in size.   the patient proceeded to undergo needle core biopsy of the lesion within the 2:00 position of the left breast, 7 cm from the nipple. 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 A third biopsy was performed of a left axillary lymph node which showed metastatic mammary carcinoma. The tumors showed estrogen and progesterone receptors to be negative. Ki- 67 was elevated at 100%. The. There was HER-2/neu amplification at 4.65. With this information the patient is now seen for evaluation.Marland Kitchen  PREVIOUS RADIATION THERAPY: No  PAST  MEDICAL HISTORY:  has a past medical history of Endometriosis and Hypertension.    PAST SURGICAL HISTORY: Past Surgical History  Procedure Laterality Date  . Palups removed      2010  . Cervical polypectomy    . Cesarean section      one previous  . Tubal ligation      FAMILY HISTORY: family history includes Breast cancer in her mother; Diabetes in her father.  SOCIAL HISTORY:  reports that she has never smoked. She has never used smokeless tobacco. She reports that she does not drink alcohol or use illicit drugs.  ALLERGIES: Review of patient's allergies indicates no known allergies.  MEDICATIONS:  Current Outpatient Prescriptions  Medication Sig Dispense Refill  . cephALEXin (KEFLEX) 500 MG capsule Take 1 capsule (500 mg total) by mouth 4 (four) times daily.  28 capsule  0  . ferrous sulfate 325 (65 FE) MG tablet Take 1 tablet (325 mg total) by mouth 2 (two) times daily with a meal.  60 tablet  3  . hydrochlorothiazide (HYDRODIURIL) 25 MG tablet Take 1 tablet (25 mg total) by mouth daily.  30 tablet  2  . oxyCODONE-acetaminophen (ROXICET) 5-325 MG per tablet Take 1 tablet by mouth every 4 (four) hours as needed for severe pain.  16 tablet  0   No current facility-administered medications for this encounter.    REVIEW OF SYSTEMS:  A 15 point review of systems is documented in the electronic medical record. This was obtained by the nursing staff. However, I reviewed this with the patient to discuss relevant findings and make appropriate changes.  The patient  palpated the area of concern on self examination. She denied any nipple discharge or bleeding prior to diagnosis.   PHYSICAL EXAM:  Vitals - 1 value per visit 5/62/5638  SYSTOLIC 937  DIASTOLIC 84  Pulse 80  Temperature 98.5  Respirations 18  Weight (lb) 194.3  Height 5' 4"   BMI 33.34  In general is a very pleasant young female in no acute distress. She is accompanied by her husband on evaluation today. Examination of  the neck and supraclavicular region reveals no evidence of adenopathy. The axillary areas are free of adenopathy. Examination of the right breast reveals no mass or nipple discharge. Examination of the left breast reveals some bruising in the upper-outer quadrant. There is a palpable mass in the 2:00 position estimated to be approximately 3.5 x 4 cm in size. No nipple discharge or bleeding is noted.  No nipple retraction.   ECOG = 0  0 - Asymptomatic (Fully active, able to carry on all predisease activities without restriction)   LABORATORY DATA:  Lab Results  Component Value Date   WBC 9.2 02/10/2014   HGB 8.3* 02/10/2014   HCT 27.7* 02/10/2014   MCV 60.6* 02/10/2014   PLT 487* 02/10/2014   Lab Results  Component Value Date   NA 137 02/10/2014   K 4.0 02/10/2014   CO2 22 02/10/2014   Lab Results  Component Value Date   ALT 11 02/10/2014   AST 17 02/10/2014   ALKPHOS 65 02/10/2014   BILITOT 0.23 02/10/2014     RADIOGRAPHY: Mr Breast Bilateral W Wo Contrast  02/08/2014   CLINICAL DATA:  Patient recently underwent ultrasound-guided core biopsies of masses in the left breast at 2 o'clock 7 cm from the nipple and at 2 o'clock 9 cm from the nipple. The patient also had a left axillary lymph node biopsied. 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 The lymph node was positive for metastatic mammary carcinoma.  EXAM: BILATERAL BREAST MRI WITH AND WITHOUT CONTRAST  LABS:  None.  TECHNIQUE: Multiplanar, multisequence MR images of both breasts were obtained prior to and following the intravenous administration of 29m of MultiHance.  THREE-DIMENSIONAL MR IMAGE RENDERING ON INDEPENDENT WORKSTATION:  Three-dimensional MR images were rendered by post-processing of the original MR data on an independent workstation. The three-dimensional MR images were interpreted, and findings are reported in the following complete MRI report for this study. Three  dimensional images were evaluated at the independent DynaCad workstation  COMPARISON:  Previous exams  FINDINGS: Breast composition: c:  Heterogeneous fibroglandular tissue  Background parenchymal enhancement: Moderate  Right breast: In the anterior third of the 9 o'clock subareolar region of the right breast there is an enhancing irregular 6 x 3 x 6 mm mass.  Left breast: There is an irregular enhancing mass in the posterior/ middle third of the upper-outer quadrant of the left breast measuring 2.8 x 2.5 x 3.1 cm. There an irregular enhancing mass in the posterior third of the upper-outer quadrant of the left breast measuring 1.3 x 0.8 x 0.8 cm. The smaller mass is located 2 cm superior and posterior to the larger mass. Both masses span an area of 5 cm. Signal void artifacts are seen in both masses from the biopsy clips.  Lymph nodes: In the right axilla several level 1 lymph nodes have slightly thickened cortices.  In the left axilla there are pathologically enlarged level 1 lymph nodes with the largest measuring 2.4  cm.  Ancillary findings:  None.  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  Enhancing mass in the 9 o'clock subareolar region of the right breast as well as mildly prominent right axillary adenopathy. Further evaluation with ultrasound is recommended. If the right subareolar mass is not seen sonographically MR guided core biopsy would be suggested.  RECOMMENDATION: Ultrasound of the right breast and right axilla is recommended. The patient will be recalled for the additional imaging.  Treatment planning of the patient's known left breast cancers and axillary metastasis is recommended.  BI-RADS CATEGORY  4: Suspicious abnormality - biopsy should be considered.   Electronically Signed   By: Lillia Mountain M.D.   On:  02/08/2014 11:48   Mm Digital Diagnostic Bilat  01/18/2014   CLINICAL DATA:  Patient notes a palpable left breast mass. There is a family history of breast cancer in her mother at age 39.  EXAM: DIGITAL DIAGNOSTIC  bilateral MAMMOGRAM WITH CAD  ULTRASOUND left BREAST  COMPARISON:  None  ACR Breast Density Category c: The breast tissue is heterogeneously dense, which may obscure small masses.  FINDINGS: There is an ill-defined mass located within the upper outer quadrant left breast. There is no distortion or worrisome calcification within either breast.  Mammographic images were processed with CAD.  On physical exam, there is a firm, mobile palpable mass located within the left breast 2 o'clock position 7 cm from the nipple which by physical examination measures approximately 3 cm in size. In addition, there is a palpable low left axillary (level 1) lymph node present which measures approximately 2 cm size.  Ultrasound is performed, showing an irregularly marginated hypoechoic mass with increased vascularity located within the left breast 2 o'clock position 7 cm from the nipple corresponding to the palpable and mammographic finding. This measures 3.2 x 2.6 x 2.0 cm in size. This is worrisome for invasive mammary carcinoma.  In addition, there is a low (level 1) left axillary lymph node present with focal cortical thickening and increased vascularity corresponding to the palpable lymph node. This measures 2.1 cm in size and is worrisome for a possible metastatic lymph node.  I discussed ultrasound-guided core biopsy of the left breast mass and abnormal appearing left axillary lymph node with the patient. This is scheduled for 01/20/2014.  IMPRESSION: 1. 3.2 cm irregular worrisome mass located within the left breast at 2 o'clock position 7 cm from the nipple corresponding to the mammographic and palpable finding. 2. Abnormal appearing level 1 left axillary lymph node.  RECOMMENDATION: Ultrasound-guided core biopsy  of the palpable left breast mass and abnormal appearing left axillary lymph node.  I have discussed the findings and recommendations with the patient. Results were also provided in writing at the conclusion of the visit. If applicable, a reminder letter will be sent to the patient regarding the next appointment.  BI-RADS CATEGORY  5: Highly suggestive of malignancy - appropriate action should be taken.   Electronically Signed   By: Luberta Robertson M.D.   On: 01/18/2014 15:11   Mm Digital Diagnostic Unilat L  01/29/2014   CLINICAL DATA:  Two biopsies were performed of suspicious masses in the left breast at 2 o'clock position today.  EXAM: POST-BIOPSY CLIP PLACEMENT LEFT DIAGNOSTIC MAMMOGRAM  COMPARISON:  Previous exams.  FINDINGS: Films are performed following ultrasound guided biopsy of an  approximately 3 cm mass left breast at 2 o'clock position 7 cm from the nipple and a smaller, 0.8 cm suspicious mass at 2 o'clock position approximately 9-10 cm from the nipple. A ribbon shaped biopsy clip is satisfactorily positioned within the larger palpable mass at 2 o'clock position. A coil shaped biopsy clip is satisfactorily positioned in the smaller mass at 2 o'clock position approximately 9-10 cm from the nipple. Both of these clips appear satisfactorily positioned on the post biopsy clip mammogram.  IMPRESSION: Satisfactory position of 2 biopsy clips in the suspicious masses in the 2 o'clock position of the left breast, as described above.  Final Assessment: Post Procedure Mammograms for Marker Placement   Electronically Signed   By: Curlene Dolphin M.D.   On: 01/29/2014 17:09   US Breast Ltd Uni Left Inc Axilla  01/18/2014   CLINICAL DATA:  Patient notes a palpable left breast mass. There is a family history of breast cancer in her mother at age 75.  EXAM: DIGITAL DIAGNOSTIC  bilateral MAMMOGRAM WITH CAD  ULTRASOUND left BREAST  COMPARISON:  None  ACR Breast Density Category c: The breast tissue is heterogeneously  dense, which may obscure small masses.  FINDINGS: There is an ill-defined mass located within the upper outer quadrant left breast. There is no distortion or worrisome calcification within either breast.  Mammographic images were processed with CAD.  On physical exam, there is a firm, mobile palpable mass located within the left breast 2 o'clock position 7 cm from the nipple which by physical examination measures approximately 3 cm in size. In addition, there is a palpable low left axillary (level 1) lymph node present which measures approximately 2 cm size.  Ultrasound is performed, showing an irregularly marginated hypoechoic mass with increased vascularity located within the left breast 2 o'clock position 7 cm from the nipple corresponding to the palpable and mammographic finding. This measures 3.2 x 2.6 x 2.0 cm in size. This is worrisome for invasive mammary carcinoma.  In addition, there is a low (level 1) left axillary lymph node present with focal cortical thickening and increased vascularity corresponding to the palpable lymph node. This measures 2.1 cm in size and is worrisome for a possible metastatic lymph node.  I discussed ultrasound-guided core biopsy of the left breast mass and abnormal appearing left axillary lymph node with the patient. This is scheduled for 01/20/2014.  IMPRESSION: 1. 3.2 cm irregular worrisome mass located within the left breast at 2 o'clock position 7 cm from the nipple corresponding to the mammographic and palpable finding. 2. Abnormal appearing level 1 left axillary lymph node.  RECOMMENDATION: Ultrasound-guided core biopsy of the palpable left breast mass and abnormal appearing left axillary lymph node.  I have discussed the findings and recommendations with the patient. Results were also provided in writing at the conclusion of the visit. If applicable, a reminder letter will be sent to the patient regarding the next appointment.  BI-RADS CATEGORY  5: Highly suggestive of  malignancy - appropriate action should be taken.   Electronically Signed   By: Luberta Robertson M.D.   On: 01/18/2014 15:11   [place] Lt Breast Bx W Loc Dev 1st Lesion Img Bx Spec US Guide  02/01/2014   ADDENDUM REPORT: 02/01/2014 15:56  ADDENDUM: Histologic evaluation demonstrates invasive ductal carcinoma and ductal carcinoma in situ involving the dominant mass at 2 o'clock 7 cm from the left nipple. This is concordant with the imaging findings. Results were discussed with the patient by telephone  at her request. She reports no complications from the procedure. Patient will be scheduled for evaluation in the [facility] on 02/10/2014.   Electronically Signed   By: Ulyess Blossom M.D.   On: 02/01/2014 15:56   02/01/2014   CLINICAL DATA:  43 year old patient with suspicious palpable mass left breast 2 o'clock position approximately 7 cm from the nipple.  EXAM: ULTRASOUND GUIDED LEFT BREAST CORE NEEDLE BIOPSY WITH VACUUM ASSIST  COMPARISON:  Previous exams.  PROCEDURE: I met with the patient and we discussed the procedure of ultrasound-guided biopsy, including benefits and alternatives. We discussed the high likelihood of a successful procedure. We discussed the risks of the procedure including infection, bleeding, tissue injury, clip migration, and inadequate sampling. Informed written consent was given. The usual time-out protocol was performed immediately prior to the procedure.  Using sterile technique and 2% Lidocaine as local anesthetic, under direct ultrasound visualization, a 12 gauge vacuum-assisted device was used to perform biopsy of a dominant palpable 2.8 cm mass at 2 o'clock position 7 cm from the nipple using a lateral to medial approach. At the conclusion of the procedure, a ribbon-shaped tissue marker clip was deployed into the biopsy cavity. Follow-up 2-view mammogram was performed and dictated separately.  IMPRESSION: Ultrasound-guided biopsy of palpable left breast  mass. No apparent complications.  Electronically Signed: By: Curlene Dolphin M.D. On: 01/29/2014 17:00   [place] Lt Breast Bx W Loc Dev Ea Add Lesion Img Bx Spec US Guide  02/01/2014   ADDENDUM REPORT: 02/01/2014 15:59  ADDENDUM: Histologic evaluation of the biopsied mass at 2 o'clock 9-10 cm from the left nipple demonstrates invasive ductal carcinoma and ductal carcinoma in situ. This is concordant with the imaging findings. Results were discussed with the patient by telephone at her request. She reports no complications from the procedure. Patient is scheduled to be evaluated in the [facility] on 02/10/2014.   Electronically Signed   By: Ulyess Blossom M.D.   On: 02/01/2014 15:59   02/01/2014   CLINICAL DATA:  43 year old patient recently discuss that 43 year old patient with suspicious left breast mass at 2 o'clock position approximately 7 cm from the nipple and suspicious left axillary lymph node. She presented for biopsies of both of these areas today. In preparation for the biopsy, a second hypoechoic suspicious mass (measuring 8 x 8 x 8 mm) is seen on ultrasound today in the left breast at 2 o'clock position approximately 9 cm to 10 from the nipple.  EXAM: ULTRASOUND GUIDED LEFT BREAST CORE NEEDLE BIOPSY WITH VACUUM ASSIST  COMPARISON:  Previous exams.  PROCEDURE: I met with the patient and we discussed the procedure of ultrasound-guided biopsy, including benefits and alternatives. We discussed the high likelihood of a successful procedure. We discussed the risks of the procedure including infection, bleeding, tissue injury, clip migration, and inadequate sampling. Informed written consent was given. The usual time-out protocol was performed immediately prior to the procedure.  Using sterile technique and 2% Lidocaine as local anesthetic, under direct ultrasound visualization, a 12 gauge vacuum-assisted device was used to perform biopsy of an 8 x 8 x 8 mm hypoechoic irregular  mass at 2 o'clock position left breast approximately 9-10 cm from the nipple using a lateral to medial approach, through the same incision used for the biopsy for the larger, palpable mass at 2 o'clock position left breast. At the conclusion of the procedure, a coil tissue marker clip was deployed into the biopsy cavity. Follow-up 2-view  mammogram was performed and dictated separately.  IMPRESSION: Ultrasound-guided biopsy of a second left breast mass (measuring 8 x 8 x 8 mm) at 2 o'clock position, 9-10 cm from the nipple. No apparent complications.  Electronically Signed: By: Curlene Dolphin M.D. On: 01/29/2014 17:06   [place] Lt Breast Bx W Loc Dev Ea Add Lesion Img Bx Spec US Guide  02/01/2014   ADDENDUM REPORT: 02/01/2014 15:57  ADDENDUM: Histologic evaluation demonstrates metastatic carcinoma involving the biopsied left axillary lymph node. This is concordant with the imaging findings. Results were discussed with the patient by telephone at her request. She reports no complications from the procedure. Patient will be scheduled for evaluation in the [facility] on 02/10/2014.   Electronically Signed   By: Ulyess Blossom M.D.   On: 02/01/2014 15:57   02/01/2014   CLINICAL DATA:  Suspicious Level 1 left axillary lymph node. Biopsy was recommended.  EXAM: ULTRASOUND GUIDED LEFT BREAST CORE NEEDLE BIOPSY  COMPARISON:  Previous exams.  FINDINGS: I met with the patient and we discussed the procedure of ultrasound-guided biopsy, including benefits and alternatives. We discussed the high likelihood of a successful procedure. We discussed the risks of the procedure, including infection, bleeding, tissue injury, and inadequate sampling. Informed written consent was given. The usual time-out protocol was performed immediately prior to the procedure.  Using sterile technique and 2% Lidocaine as local anesthetic, under direct ultrasound visualization, a 14 gauge spring-loaded device was used to  perform biopsy of a suspicious level 1 left axillary lymph node, with a hypoechoic thickened cortex using a lateral to medial approach.  IMPRESSION: Ultrasound guided biopsy of a left axillary lymph node. No apparent complications.  Electronically Signed: By: Curlene Dolphin M.D. On: 01/29/2014 17:10      IMPRESSION: Clinical stage II multifocal invasive ductal carcinoma of the left breast. Patient would appear to be a candidate for breast conservation therapy and she desires this treatment approach. With  family history of breast cancer and age of  the patient, she will proceed with genetic evaluation. With the questionable lesion in the right breast she will proceed with ultrasound. The patient would appear to be a good candidate for neoadjuvant treatment today with tumor shrinkage resulting in a better cosmetic outcome. This will also allow for completion of genetic studies so that a more informed decision about appropriate surgery can be made.  PLAN: Patient will be seen in the postoperative setting for further evaluation as to radiation therapy recommendations.  I spent 40 minutes minutes face to face with the patient and more than 50% of that time was spent in counseling and/or coordination of care.   Total time spent in the encounter was 65 minutes.    ------------------------------------------------  -----------------------------------  Blair Promise, PhD, MD

## 2014-02-10 NOTE — Progress Notes (Signed)
Checked in new pt that's in the [facility] program.  Pt is working with Christine's team to get approved for Medicaid.

## 2014-02-10 NOTE — Progress Notes (Signed)
Patient ID: Tamara [name], female   DOB: 1971-11-27, 43 y.o.   MRN: [id number]  No chief complaint on file.   HPI Tamara [name] is a 43 y.o. female.  Pt sent at request of Dr Curlene Dolphin for left breast cancer.  Two foci of cancer notes and positIve LN bx IDC 3.2 CM AND 8 MM respectively in different quadrants.  HPI  Past Medical History  Diagnosis Date  . Endometriosis   . Hypertension     Past Surgical History  Procedure Laterality Date  . Palups removed      2010  . Cervical polypectomy    . Cesarean section      one previous  . Tubal ligation      Family History  Problem Relation Age of Onset  . Breast cancer Mother   . Diabetes Father     Social History History  Substance Use Topics  . Smoking status: Never Smoker   . Smokeless tobacco: Never Used  . Alcohol Use: No    No Known Allergies  Current Outpatient Prescriptions  Medication Sig Dispense Refill  . cephALEXin (KEFLEX) 500 MG capsule Take 1 capsule (500 mg total) by mouth 4 (four) times daily.  28 capsule  0  . ferrous sulfate 325 (65 FE) MG tablet Take 1 tablet (325 mg total) by mouth 2 (two) times daily with a meal.  60 tablet  3  . hydrochlorothiazide (HYDRODIURIL) 25 MG tablet Take 1 tablet (25 mg total) by mouth daily.  30 tablet  2  . oxyCODONE-acetaminophen (ROXICET) 5-325 MG per tablet Take 1 tablet by mouth every 4 (four) hours as needed for severe pain.  16 tablet  0   No current facility-administered medications for this visit.    Review of Systems Review of Systems  Constitutional: Negative for fever, chills and unexpected weight change.  HENT: Negative for congestion, hearing loss, sore throat, trouble swallowing and voice change.   Eyes: Negative for visual disturbance.  Respiratory: Negative for cough and wheezing.   Cardiovascular: Negative for chest pain, palpitations and leg swelling.  Gastrointestinal: Negative for nausea, vomiting, abdominal pain, diarrhea, constipation, blood in  stool, abdominal distention and anal bleeding.  Genitourinary: Negative for hematuria, vaginal bleeding and difficulty urinating.  Musculoskeletal: Negative for arthralgias.  Skin: Negative for rash and wound.  Neurological: Negative for seizures, syncope and headaches.  Hematological: Negative for adenopathy. Does not bruise/bleed easily.  Psychiatric/Behavioral: Negative for confusion.    Last menstrual period 01/02/2014.  Physical Exam Physical Exam  Constitutional: She is oriented to person, place, and time. She appears well-developed and well-nourished.  HENT:  Head: Normocephalic and atraumatic.  Eyes: EOM are normal. Pupils are equal, round, and reactive to light.  Neck: Normal range of motion. Neck supple.  Cardiovascular: Normal rate and regular rhythm.   Pulmonary/Chest: Effort normal. Right breast exhibits no inverted nipple, no mass, no nipple discharge, no skin change and no tenderness. Left breast exhibits mass and tenderness. Left breast exhibits no inverted nipple, no nipple discharge and no skin change. Breasts are symmetrical.    Musculoskeletal: Normal range of motion.  Lymphadenopathy:    She has no cervical adenopathy.    She has no axillary adenopathy.  Neurological: She is alert and oriented to person, place, and time.  Skin: Skin is warm and dry.  Psychiatric: She has a normal mood and affect. Her behavior is normal. Judgment and thought content normal.    Data Reviewed CLINICAL DATA: Patient recently underwent ultrasound-guided  core  biopsies of masses in the left breast at 2 o'clock 7 cm from the  nipple and at 2 o'clock 9 cm from the nipple. The patient also had a  left axillary lymph node biopsied. 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 The lymph node was positive for metastatic mammary  carcinoma.  EXAM:  BILATERAL BREAST MRI WITH AND WITHOUT CONTRAST  LABS: None.  TECHNIQUE:    Multiplanar, multisequence MR images of both breasts were obtained  prior to and following the intravenous administration of 80ml of  MultiHance.  THREE-DIMENSIONAL MR IMAGE RENDERING ON INDEPENDENT WORKSTATION:  Three-dimensional MR images were rendered by post-processing of the  original MR data on an independent workstation. The  three-dimensional MR images were interpreted, and findings are  reported in the following complete MRI report for this study. Three  dimensional images were evaluated at the independent DynaCad  workstation  COMPARISON: Previous exams  FINDINGS:  Breast composition: c: Heterogeneous fibroglandular tissue  Background parenchymal enhancement: Moderate  Right breast: In the anterior third of the 9 o'clock subareolar  region of the right breast there is an enhancing irregular 6 x 3 x 6  mm mass.  Left breast: There is an irregular enhancing mass in the posterior/  middle third of the upper-outer quadrant of the left breast  measuring 2.8 x 2.5 x 3.1 cm. There an irregular enhancing mass in  the posterior third of the upper-outer quadrant of the left breast  measuring 1.3 x 0.8 x 0.8 cm. The smaller mass is located 2 cm  superior and posterior to the larger mass. Both masses span an area  of 5 cm. Signal void artifacts are seen in both masses from the  biopsy clips.  Lymph nodes: In the right axilla several level 1 lymph nodes have  slightly thickened cortices.  In the left axilla there are pathologically enlarged level 1 lymph  nodes with the largest measuring 2.4 cm.  Ancillary findings: None.  IMPRESSION:  Two enhancing masses in the upper-outer quadrant of the left breast  with enlarged axillary adenopathy corresponding with the patient's  known areas of invasive ductal carcinoma and DCIS as well as  axillary metastasis.  Enhancing mass in the 9 o'clock subareolar region of the right  breast as well as mildly prominent right axillary adenopathy.   Further evaluation with ultrasound is recommended. If the right  subareolar mass is not seen sonographically MR guided core biopsy  would be suggested.  RECOMMENDATION:  Ultrasound of the right breast and right axilla is recommended. The  patient will be recalled for the additional imaging.  Treatment planning of the patient's known left breast cancers and  axillary metastasis is recommended.  BI-RADS CATEGORY 4: Suspicious abnormality - biopsy should be ER-PR- her 2 neu positive  Assessment    Multicentric left breast cancer and positive LN axilla.  May be able to conserve breast.     Plan    Neoadjuvant chemotherapy.  Pt needs port and have discussed this with her.  Risk of PTX,  Hemothorax,  Organ injury,  Catheter migration and replacement discussed,The procedure has been discussed with the patient.  Alternative therapies have been discussed with the patient.  Operative risks include bleeding,  Infection,  Organ injury,  Nerve injury,  Blood vessel injury,  DVT,  Pulmonary embolism,  Death,  And possible reoperation.    .  The patient understands and agrees to  proceed.       Tamara [name] A. 02/10/2014, 11:12 AM

## 2014-02-11 ENCOUNTER — Ambulatory Visit (HOSPITAL_BASED_OUTPATIENT_CLINIC_OR_DEPARTMENT_OTHER): Payer: Medicaid Other | Admitting: Genetic Counselor

## 2014-02-11 ENCOUNTER — Other Ambulatory Visit: Payer: Medicaid Other

## 2014-02-11 ENCOUNTER — Encounter: Payer: Self-pay | Admitting: Genetic Counselor

## 2014-02-11 DIAGNOSIS — Z800 Family history of malignant neoplasm of digestive organs: Secondary | ICD-10-CM

## 2014-02-11 DIAGNOSIS — IMO0002 Reserved for concepts with insufficient information to code with codable children: Secondary | ICD-10-CM

## 2014-02-11 DIAGNOSIS — Z803 Family history of malignant neoplasm of breast: Secondary | ICD-10-CM

## 2014-02-11 DIAGNOSIS — C50412 Malignant neoplasm of upper-outer quadrant of left female breast: Secondary | ICD-10-CM

## 2014-02-11 DIAGNOSIS — Z8049 Family history of malignant neoplasm of other genital organs: Secondary | ICD-10-CM

## 2014-02-11 DIAGNOSIS — C50419 Malignant neoplasm of upper-outer quadrant of unspecified female breast: Secondary | ICD-10-CM

## 2014-02-11 NOTE — Progress Notes (Signed)
Dr.  Marcy Panning requested a consultation for genetic counseling and risk assessment for Tamara [name], a 43 y.o. female, for discussion of her personal history of breast cancer and family history of breast, uterine and pancreatic cancer.  She presents to clinic today to discuss the possibility of a genetic predisposition to cancer, and to further clarify her risks, as well as her family members' risks for cancer.   HISTORY OF PRESENT ILLNESS: In March 2015, at the age of 61, Tamara [name] was diagnosed with invasive ductal carcinoma of the breast. This will be treated with chemotherapy, surgery and radiation.  The tumor is ER-/PR-/Her2+.  She reports a diagnosis of endometriosis at age 12.  She reports that her mother had genetic testing and was negative.   Past Medical History  Diagnosis Date  . Endometriosis   . Hypertension   . Breast cancer 02/01/14    ER-/PR-/Her2+    Past Surgical History  Procedure Laterality Date  . Palups removed      2010  . Cervical polypectomy    . Cesarean section      one previous  . Tubal ligation      History   Social History  . Marital Status: Single    Spouse Name: N/A    Number of Children: 5  . Years of Education: N/A   Social History Main Topics  . Smoking status: Former Smoker -- 0.25 packs/day for 15 years  . Smokeless tobacco: Former Systems developer    Quit date: 02/11/2009  . Alcohol Use: No  . Drug Use: No  . Sexual Activity: Not Currently    Birth Control/ Protection: Surgical   Other Topics Concern  . None   Social History Narrative  . None    REPRODUCTIVE HISTORY AND PERSONAL RISK ASSESSMENT FACTORS: Menarche was at age 35.   premenopausal Uterus Intact: yes Ovaries Intact: yes [id number], first live birth at age 42  She has not previously undergone treatment for infertility.   Oral Contraceptive use: 0 years   She has not used HRT in the past.    FAMILY HISTORY:  We obtained a detailed, 4-generation family history.   Significant diagnoses are listed below: Family History  Problem Relation Age of Onset  . Breast cancer Mother 72  . Diabetes Father   . Pancreatic cancer Father 37  . Uterine cancer Maternal Aunt     dx in her 86s-50s  . Breast cancer Paternal Aunt     dx in her 54s  . Stroke Maternal Grandfather   . Breast cancer Paternal Aunt     dx in her 60s-50s  . Breast cancer Cousin     paternal cousin dx in her 63s-40s   Patient's maternal ancestors are of Caucasian, [place] and [place] descent, and paternal ancestors are of African American descent. There is no reported Ashkenazi Jewish ancestry. There is no known consanguinity.  GENETIC COUNSELING ASSESSMENT: Tamara [name] is a 43 y.o. female with a personal history of breast cancer and family history of breast, uterine, and pancreatic cancer which somewhat suggestive of a hereditary cancer syndrome and predisposition to cancer. We, therefore, discussed and recommended the following at today's visit.   DISCUSSION: We reviewed the characteristics, features and inheritance patterns of hereditary cancer syndromes. We also discussed genetic testing, including the appropriate family members to test, the process of testing, insurance coverage and turn-around-time for results. We reviewed hereditary cancer syndromes that involve the cancers in her family including BRCA, PALB2 and  PTEN mutations.  The patient does not have any insurance, and is waiting to apply for Medicaid.  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  We will apply for [facility]'s financial assistance program.  PLAN: After considering the risks, benefits, and limitations, Tamara [name] provided informed consent to pursue genetic testing and the blood sample will be sent to [facility] for analysis of the [facility]. We discussed the  implications of a positive, negative and/ or variant of uncertain significance genetic test result. Results should be available within approximately 3 weeks' time, at which point they will be disclosed by telephone to Tamara [name], as will any additional recommendations warranted by these results. Tamara [name] will receive a summary of her genetic counseling visit and a copy of her results once available. This information will also be available in Epic. We encouraged Tamara [name] to remain in contact with cancer genetics annually so that we can continuously update the family history and inform her of any changes in cancer genetics and testing that may be of benefit for her family. Tamara [name]'s questions were answered to her satisfaction today. Our contact information was provided should additional questions or concerns arise.  The patient was seen for a total of 60 minutes, greater than 50% of which was spent face-to-face counseling.  This note will also be sent to the referring provider via the electronic medical record. The patient will be supplied with a summary of this genetic counseling discussion as well as educational information on the discussed hereditary cancer syndromes following the conclusion of their visit.   Patient was discussed with Dr. Marcy Panning.   _______________________________________________________________________ For Office Staff:  Number of people involved in session: 1 Was an Intern/ student involved with case: no

## 2014-02-12 ENCOUNTER — Ambulatory Visit
Admission: RE | Admit: 2014-02-12 | Discharge: 2014-02-12 | Disposition: A | Discharge status: Home / Self Care | Payer: Medicaid Other | Source: Ambulatory Visit | Attending: Surgery | Admitting: Surgery

## 2014-02-12 DIAGNOSIS — R928 Other abnormal and inconclusive findings on diagnostic imaging of breast: Secondary | ICD-10-CM

## 2014-02-14 ENCOUNTER — Encounter: Payer: Self-pay | Admitting: Oncology

## 2014-02-14 MED ORDER — LIDOCAINE-PRILOCAINE 2.5-2.5 % EX CREA: TOPICAL_CREAM | CUTANEOUS | Status: DC | PRN

## 2014-02-14 NOTE — Progress Notes (Signed)
Tamara [name] [id number] 1971-05-23 43 y.o. 02/14/2014 9:41 PM  CC Dr. Gery Pray Dr. Erroll Luna  REASON FOR CONSULTATION:  43 year old female went diagnosis of left breast cancer. Patient is seen in the multidisciplinary breast clinic for discussion of treatment options.  STAGE:   Breast cancer of upper-outer quadrant of left female breast   Primary site: Breast (Left)   Staging method: AJCC 7th Edition   Clinical: Stage IIB (T2, N1, cM0)   Summary: Stage IIB (T2, N1, cM0)  REFERRING PHYSICIAN: Dr. Marcello Moores Cornett  HISTORY OF PRESENT ILLNESS:  Tamara [name] is a 43 y.o. female.  Otherwise very healthy female who palpated a left breast mass in the left upper quadrant. She went on to have a mammogram performed that showed at the 2:00 position 3.2 cm mass 7 cm from the nipple at the 2:00 position 8 mm mass approximately 9 cm from the mid nipple was noted. She was also noted to have a positive lymph node. The area between the 2 masses measuring approximately 4 cm. Patient went on to have a biopsy of both of these masses as well as the lymph node. 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 Lymph node was positive for metastatic disease. On MRI she was noted to have 2.8 and 1.3 cm masses lymph node was also positive. Also noted in the right breast was a 6 mm nodule. She is due to get an ultrasound-guided biopsy on the 13th of this. Patient's case was discussed at the multidisciplinary breast conference she is now seen in the clinic for discussion of treatment options. She does have a family history significant for mom with breast cancer at the age of 61. She is otherwise without any complaints. She is accompanied by her husband.   Past Medical History: Past Medical History  Diagnosis Date  . Endometriosis   . Hypertension   . Breast cancer 02/01/14    ER-/PR-/Her2+     Past Surgical History: Past Surgical History  Procedure Laterality Date  . Palups removed      2010  . Cervical polypectomy    . Cesarean section      one previous  . Tubal ligation      Family History: Family History  Problem Relation Age of Onset  . Breast cancer Mother 62  . Diabetes Father   . Pancreatic cancer Father 67  . Uterine cancer Maternal Aunt     dx in her 70s-50s  . Breast cancer Paternal Aunt     dx in her 55s  . Stroke Maternal Grandfather   . Breast cancer Paternal Aunt     dx in her 58s-50s  . Breast cancer Cousin     paternal cousin dx in her 57s-40s    Social History History  Substance Use Topics  . Smoking status: Former Smoker -- 0.25 packs/day for 15 years  . Smokeless tobacco: Former Systems developer    Quit date: 02/11/2009  . Alcohol Use: No    Allergies: No Known Allergies  Current Medications: Current Outpatient Prescriptions  Medication Sig Dispense Refill  . hydrochlorothiazide (HYDRODIURIL) 25 MG tablet Take 1 tablet (25 mg total) by mouth daily.  30 tablet  2  . cephALEXin (KEFLEX) 500 MG capsule Take 1 capsule (500 mg total) by mouth 4 (four) times daily.  28 capsule  0  . ferrous sulfate 325 (65 FE) MG tablet Take  1 tablet (325 mg total) by mouth 2 (two) times daily with a meal.  60 tablet  3  . oxyCODONE-acetaminophen (ROXICET) 5-325 MG per tablet Take 1 tablet by mouth every 4 (four) hours as needed for severe pain.  16 tablet  0   No current facility-administered medications for this visit.    OB/GYN History: Menarche at age 25 last menstrual period was October 25. First live birth at 74 she is G5 P5 she is currently not on any birth control pills  Fertility Discussion: Patient has completed her family Prior History of Cancer: No  Health Maintenance:  Colonoscopy no Bone Density no Last PAP smear 2013  ECOG PERFORMANCE STATUS: 0 - Asymptomatic  Genetic Counseling/testing: yes  REVIEW OF SYSTEMS:  Comprehensive 14  point review of systems was obtained and it is scant separately into the electronic medical record  PHYSICAL EXAMINATION: Blood pressure 134/84, pulse 80, temperature 98.5 F (36.9 C), temperature source Oral, resp. rate 18, height _0  (1.626 m), weight 194 lb 4.8 oz (88.134 kg), last menstrual period 01/02/2014.  General:  well-nourished in no acute distress.  Eyes:  no scleral icterus.  ENT:  There were no oropharyngeal lesions.  Neck was without thyromegaly.  Lymphatics:  Negative cervical, supraclavicular or axillary adenopathy.  Respiratory: lungs were clear bilaterally without wheezing or crackles.  Cardiovascular:  Regular rate and rhythm, S1/S2, without murmur, rub or gallop.  There was no pedal edema.  GI:  abdomen was soft, flat, nontender, nondistended, without organomegaly.  Muscoloskeletal:  no spinal tenderness of palpation of vertebral spine.  Skin exam was without echymosis, petichae.  Neuro exam was nonfocal.  Patient was able to get on and off exam table without assistance.  Gait was normal.  Patient was alerted and oriented.  Attention was good.   Language was appropriate.  Mood was normal without depression.  Speech was not pressured.  Thought content was not tangential.   Breasts: right breast normal without mass, skin or nipple changes or axillary nodes, left breast normal without mass, skin or nipple changes or axillary nodes, abnormal mass palpable  In the left breast.   STUDIES/RESULTS: Mr Breast Bilateral W Wo Contrast  02/08/2014   CLINICAL DATA:  Patient recently underwent ultrasound-guided core biopsies of masses in the left breast at 2 o'clock 7 cm from the nipple and at 2 o'clock 9 cm from the nipple. The patient also had a left axillary lymph node biopsied. 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 The lymph node was positive for metastatic mammary carcinoma.  EXAM: BILATERAL BREAST MRI WITH AND WITHOUT  CONTRAST  LABS:  None.  TECHNIQUE: Multiplanar, multisequence MR images of both breasts were obtained prior to and following the intravenous administration of 57m of MultiHance.  THREE-DIMENSIONAL MR IMAGE RENDERING ON INDEPENDENT WORKSTATION:  Three-dimensional MR images were rendered by post-processing of the original MR data on an independent workstation. The three-dimensional MR images were interpreted, and findings are reported in the following complete MRI report for this study. Three dimensional images were evaluated at the independent DynaCad workstation  COMPARISON:  Previous exams  FINDINGS: Breast composition: c:  Heterogeneous fibroglandular tissue  Background parenchymal enhancement: Moderate  Right breast: In the anterior third of the 9 o'clock subareolar region of the right breast there is an enhancing irregular 6 x 3 x 6 mm mass.  Left breast: There is an irregular enhancing mass in the posterior/ middle third  of the upper-outer quadrant of the left breast measuring 2.8 x 2.5 x 3.1 cm. There an irregular enhancing mass in the posterior third of the upper-outer quadrant of the left breast measuring 1.3 x 0.8 x 0.8 cm. The smaller mass is located 2 cm superior and posterior to the larger mass. Both masses span an area of 5 cm. Signal void artifacts are seen in both masses from the biopsy clips.  Lymph nodes: In the right axilla several level 1 lymph nodes have slightly thickened cortices.  In the left axilla there are pathologically enlarged level 1 lymph nodes with the largest measuring 2.4 cm.  Ancillary findings:  None.  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  Enhancing mass in the 9 o'clock subareolar region of the right breast as well as mildly prominent right axillary adenopathy. Further evaluation with  ultrasound is recommended. If the right subareolar mass is not seen sonographically MR guided core biopsy would be suggested.  RECOMMENDATION: Ultrasound of the right breast and right axilla is recommended. The patient will be recalled for the additional imaging.  Treatment planning of the patient's known left breast cancers and axillary metastasis is recommended.  BI-RADS CATEGORY  4: Suspicious abnormality - biopsy should be considered.   Electronically Signed   By: Lillia Mountain M.D.   On: 02/08/2014 11:48   Mm Digital Diagnostic Bilat  01/18/2014   CLINICAL DATA:  Patient notes a palpable left breast mass. There is a family history of breast cancer in her mother at age 51.  EXAM: DIGITAL DIAGNOSTIC  bilateral MAMMOGRAM WITH CAD  ULTRASOUND left BREAST  COMPARISON:  None  ACR Breast Density Category c: The breast tissue is heterogeneously dense, which may obscure small masses.  FINDINGS: There is an ill-defined mass located within the upper outer quadrant left breast. There is no distortion or worrisome calcification within either breast.  Mammographic images were processed with CAD.  On physical exam, there is a firm, mobile palpable mass located within the left breast 2 o'clock position 7 cm from the nipple which by physical examination measures approximately 3 cm in size. In addition, there is a palpable low left axillary (level 1) lymph node present which measures approximately 2 cm size.  Ultrasound is performed, showing an irregularly marginated hypoechoic mass with increased vascularity located within the left breast 2 o'clock position 7 cm from the nipple corresponding to the palpable and mammographic finding. This measures 3.2 x 2.6 x 2.0 cm in size. This is worrisome for invasive mammary carcinoma.  In addition, there is a low (level 1) left axillary lymph node present with focal cortical thickening and increased vascularity corresponding to the palpable lymph node. This measures 2.1 cm in size and is  worrisome for a possible metastatic lymph node.  I discussed ultrasound-guided core biopsy of the left breast mass and abnormal appearing left axillary lymph node with the patient. This is scheduled for 01/20/2014.  IMPRESSION: 1. 3.2 cm irregular worrisome mass located within the left breast at 2 o'clock position 7 cm from the nipple corresponding to the mammographic and palpable finding. 2. Abnormal appearing level 1 left axillary lymph node.  RECOMMENDATION: Ultrasound-guided core biopsy of the palpable left breast mass and abnormal appearing left axillary lymph node.  I have discussed the findings and recommendations with the patient. Results were also provided in  writing at the conclusion of the visit. If applicable, a reminder letter will be sent to the patient regarding the next appointment.  BI-RADS CATEGORY  5: Highly suggestive of malignancy - appropriate action should be taken.   Electronically Signed   By: Luberta Robertson M.D.   On: 01/18/2014 15:11   Mm Digital Diagnostic Unilat L  01/29/2014   CLINICAL DATA:  Two biopsies were performed of suspicious masses in the left breast at 2 o'clock position today.  EXAM: POST-BIOPSY CLIP PLACEMENT LEFT DIAGNOSTIC MAMMOGRAM  COMPARISON:  Previous exams.  FINDINGS: Films are performed following ultrasound guided biopsy of an approximately 3 cm mass left breast at 2 o'clock position 7 cm from the nipple and a smaller, 0.8 cm suspicious mass at 2 o'clock position approximately 9-10 cm from the nipple. A ribbon shaped biopsy clip is satisfactorily positioned within the larger palpable mass at 2 o'clock position. A coil shaped biopsy clip is satisfactorily positioned in the smaller mass at 2 o'clock position approximately 9-10 cm from the nipple. Both of these clips appear satisfactorily positioned on the post biopsy clip mammogram.  IMPRESSION: Satisfactory position of 2 biopsy clips in the suspicious masses in the 2 o'clock position of the left breast, as  described above.  Final Assessment: Post Procedure Mammograms for Marker Placement   Electronically Signed   By: Curlene Dolphin M.D.   On: 01/29/2014 17:09   US Breast Ltd Uni Left Inc Axilla  01/18/2014   CLINICAL DATA:  Patient notes a palpable left breast mass. There is a family history of breast cancer in her mother at age 57.  EXAM: DIGITAL DIAGNOSTIC  bilateral MAMMOGRAM WITH CAD  ULTRASOUND left BREAST  COMPARISON:  None  ACR Breast Density Category c: The breast tissue is heterogeneously dense, which may obscure small masses.  FINDINGS: There is an ill-defined mass located within the upper outer quadrant left breast. There is no distortion or worrisome calcification within either breast.  Mammographic images were processed with CAD.  On physical exam, there is a firm, mobile palpable mass located within the left breast 2 o'clock position 7 cm from the nipple which by physical examination measures approximately 3 cm in size. In addition, there is a palpable low left axillary (level 1) lymph node present which measures approximately 2 cm size.  Ultrasound is performed, showing an irregularly marginated hypoechoic mass with increased vascularity located within the left breast 2 o'clock position 7 cm from the nipple corresponding to the palpable and mammographic finding. This measures 3.2 x 2.6 x 2.0 cm in size. This is worrisome for invasive mammary carcinoma.  In addition, there is a low (level 1) left axillary lymph node present with focal cortical thickening and increased vascularity corresponding to the palpable lymph node. This measures 2.1 cm in size and is worrisome for a possible metastatic lymph node.  I discussed ultrasound-guided core biopsy of the left breast mass and abnormal appearing left axillary lymph node with the patient. This is scheduled for 01/20/2014.  IMPRESSION: 1. 3.2 cm irregular worrisome mass located within the left breast at 2 o'clock position 7 cm from the nipple corresponding  to the mammographic and palpable finding. 2. Abnormal appearing level 1 left axillary lymph node.  RECOMMENDATION: Ultrasound-guided core biopsy of the palpable left breast mass and abnormal appearing left axillary lymph node.  I have discussed the findings and recommendations with the patient. Results were also provided in writing at the conclusion of the visit. If applicable, a reminder  letter will be sent to the patient regarding the next appointment.  BI-RADS CATEGORY  5: Highly suggestive of malignancy - appropriate action should be taken.   Electronically Signed   By: Luberta Robertson M.D.   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 A left axillary lymph node was biopsied and showed metastatic disease. Patient had a recent MRI which showed a mass in the 9 o'clock region of the right breast and possible right axillary adenopathy. Further evaluation with ultrasound was recommended.  EXAM: ULTRASOUND OF THE RIGHT BREAST  COMPARISON:  With priors.  FINDINGS: On physical exam,I do not palpate a mass in the right breast.  Ultrasound is performed, showing no sonographic correlate to the mass seen with MR imaging in the 9 o'clock region of the right breast. Right axillary lymph nodes show normal fatty hila with no abnormal axillary adenopathy.  IMPRESSION: Suspicious right breast seen with MR imaging.  RECOMMENDATION: MR guided core biopsy of the right breast is recommended and will be scheduled at the patient's convenience.  I have discussed the findings and recommendations with the patient. Results were also provided in writing at the conclusion of the visit. If applicable, a reminder letter will be sent to the patient regarding the next appointment.  BI-RADS CATEGORY  4: Suspicious  abnormality - biopsy should be considered.   Electronically Signed   By: Lillia Mountain M.D.   On: 02/12/2014 10:29   [place] Lt Breast Bx W Loc Dev 1st Lesion Img Bx Spec US Guide  02/01/2014   ADDENDUM REPORT: 02/01/2014 15:56  ADDENDUM: Histologic evaluation demonstrates invasive ductal carcinoma and ductal carcinoma in situ involving the dominant mass at 2 o'clock 7 cm from the left nipple. This is concordant with the imaging findings. Results were discussed with the patient by telephone at her request. She reports no complications from the procedure. Patient will be scheduled for evaluation in the [facility] on 02/10/2014.   Electronically Signed   By: Ulyess Blossom M.D.   On: 02/01/2014 15:56   02/01/2014   CLINICAL DATA:  43 year old patient with suspicious palpable mass left breast 2 o'clock position approximately 7 cm from the nipple.  EXAM: ULTRASOUND GUIDED LEFT BREAST CORE NEEDLE BIOPSY WITH VACUUM ASSIST  COMPARISON:  Previous exams.  PROCEDURE: I met with the patient and we discussed the procedure of ultrasound-guided biopsy, including benefits and alternatives. We discussed the high likelihood of a successful procedure. We discussed the risks of the procedure including infection, bleeding, tissue injury, clip migration, and inadequate sampling. Informed written consent was given. The usual time-out protocol was performed immediately prior to the procedure.  Using sterile technique and 2% Lidocaine as local anesthetic, under direct ultrasound visualization, a 12 gauge vacuum-assisted device was used to perform biopsy of a dominant palpable 2.8 cm mass at 2 o'clock position 7 cm from the nipple using a lateral to medial approach. At the conclusion of the procedure, a ribbon-shaped tissue marker clip was deployed into the biopsy cavity. Follow-up 2-view mammogram was performed and dictated separately.  IMPRESSION: Ultrasound-guided biopsy of palpable left breast mass. No  apparent complications.  Electronically Signed: By: Curlene Dolphin M.D. On: 01/29/2014 17:00   [place] Lt Breast  Bx W Loc Dev Ea Add Lesion Img Bx Spec US Guide  02/01/2014   ADDENDUM REPORT: 02/01/2014 15:59  ADDENDUM: Histologic evaluation of the biopsied mass at 2 o'clock 9-10 cm from the left nipple demonstrates invasive ductal carcinoma and ductal carcinoma in situ. This is concordant with the imaging findings. Results were discussed with the patient by telephone at her request. She reports no complications from the procedure. Patient is scheduled to be evaluated in the [facility] on 02/10/2014.   Electronically Signed   By: Ulyess Blossom M.D.   On: 02/01/2014 15:59   02/01/2014   CLINICAL DATA:  43 year old patient recently discuss that 43 year old patient with suspicious left breast mass at 2 o'clock position approximately 7 cm from the nipple and suspicious left axillary lymph node. She presented for biopsies of both of these areas today. In preparation for the biopsy, a second hypoechoic suspicious mass (measuring 8 x 8 x 8 mm) is seen on ultrasound today in the left breast at 2 o'clock position approximately 9 cm to 10 from the nipple.  EXAM: ULTRASOUND GUIDED LEFT BREAST CORE NEEDLE BIOPSY WITH VACUUM ASSIST  COMPARISON:  Previous exams.  PROCEDURE: I met with the patient and we discussed the procedure of ultrasound-guided biopsy, including benefits and alternatives. We discussed the high likelihood of a successful procedure. We discussed the risks of the procedure including infection, bleeding, tissue injury, clip migration, and inadequate sampling. Informed written consent was given. The usual time-out protocol was performed immediately prior to the procedure.  Using sterile technique and 2% Lidocaine as local anesthetic, under direct ultrasound visualization, a 12 gauge vacuum-assisted device was used to perform biopsy of an 8 x 8 x 8 mm hypoechoic irregular mass at 2  o'clock position left breast approximately 9-10 cm from the nipple using a lateral to medial approach, through the same incision used for the biopsy for the larger, palpable mass at 2 o'clock position left breast. At the conclusion of the procedure, a coil tissue marker clip was deployed into the biopsy cavity. Follow-up 2-view mammogram was performed and dictated separately.  IMPRESSION: Ultrasound-guided biopsy of a second left breast mass (measuring 8 x 8 x 8 mm) at 2 o'clock position, 9-10 cm from the nipple. No apparent complications.  Electronically Signed: By: Curlene Dolphin M.D. On: 01/29/2014 17:06   [place] Lt Breast Bx W Loc Dev Ea Add Lesion Img Bx Spec US Guide  02/01/2014   ADDENDUM REPORT: 02/01/2014 15:57  ADDENDUM: Histologic evaluation demonstrates metastatic carcinoma involving the biopsied left axillary lymph node. This is concordant with the imaging findings. Results were discussed with the patient by telephone at her request. She reports no complications from the procedure. Patient will be scheduled for evaluation in the [facility] on 02/10/2014.   Electronically Signed   By: Ulyess Blossom M.D.   On: 02/01/2014 15:57   02/01/2014   CLINICAL DATA:  Suspicious Level 1 left axillary lymph node. Biopsy was recommended.  EXAM: ULTRASOUND GUIDED LEFT BREAST CORE NEEDLE BIOPSY  COMPARISON:  Previous exams.  FINDINGS: I met with the patient and we discussed the procedure of ultrasound-guided biopsy, including benefits and alternatives. We discussed the high likelihood of a successful procedure. We discussed the risks of the procedure, including infection, bleeding, tissue injury, and inadequate sampling. Informed written consent was given. The usual time-out protocol was performed immediately prior to the procedure.  Using sterile technique and 2% Lidocaine as local anesthetic, under direct  ultrasound visualization, a 14 gauge spring-loaded device was used to perform  biopsy of a suspicious level 1 left axillary lymph node, with a hypoechoic thickened cortex using a lateral to medial approach.  IMPRESSION: Ultrasound guided biopsy of a left axillary lymph node. No apparent complications.  Electronically Signed: By: Curlene Dolphin M.D. On: 01/29/2014 17:10     LABS:    Chemistry      Component Value Date/Time   NA 137 02/10/2014 0812   K 4.0 02/10/2014 0812   CO2 22 02/10/2014 0812   BUN 20.5 02/10/2014 0812   CREATININE 0.9 02/10/2014 0812      Component Value Date/Time   CALCIUM 9.5 02/10/2014 0812   ALKPHOS 65 02/10/2014 0812   AST 17 02/10/2014 0812   ALT 11 02/10/2014 0812   BILITOT 0.23 02/10/2014 0812      Lab Results  Component Value Date   WBC 9.2 02/10/2014   HGB 8.3* 02/10/2014   HCT 27.7* 02/10/2014   MCV 60.6* 02/10/2014   PLT 487* 02/10/2014       ASSESSMENT/PLAN    43 year old female with  #1 stage II (T2 N1) invasive ductal carcinoma of the left breast found on self breast examination and mammogram. Patient has had biopsies performed the tumor was invasive ductal carcinoma grade 2 through 3, ER negative PR negative HER-2/neu positive with an elevated proliferation marker Ki-67. On MRI she was found to have another mass in the right breast which will be biopsied under ultrasound guidance on Friday the 13th.  #2 We spent the better part of today's hour-long appointment discussing the biology of breast cancer in general, and the specifics of the patient's tumor in particular.Patient and I went over her pathology in detail today. We discussed the significance of the HER-2/neu receptor as well as the estrogen and progesterone receptors. We discussed her treatment options. Patient is a candidate for chemotherapy along with anti-HER-2 therapy such as Herceptin/perjeta to be given neoadjuvantly in her case.we discussed the rationale as well as risks and benefits of chemotherapy as well as anti-HER-2 therapy. We discussed the neoadjuvant antiHer2  approach to treatment with chemotherapy and anti-HER-2 therapy. We discussed treatment with chemotherapy consisting of Taxotere and carboplatinum/Herceptin/perjeta to be given every 3 weeks for a total of 6 cycles. Once patient completes chemotherapy she will receive Herceptin every 3 weeks to finish out 1 year of Herceptin.   #3 we discussed genetic counseling and testing due to patient's family history of breast cancer in her mother and patient young age . We discussed the rationale for testing as well as implications of testing to her as well as her offspring spent other family members.   #4 we discussed the need for proceeding with a Port-A-Cath placement. We also discussed chemotherapy education class, echocardiogram to monitor patient's heart function throughout the HER-2 therapy. We also discussed role of cardiology in monitoring her heart function   #5 we discussed staging scans to determine any distant systemic disease. We will plan to do PET/CT   #6 I will plan on seeing the patient back after she has had her port placed, chemotherapy class, and echocardiogram.     Thank you so much for allowing me to participate in the care of Tamara [name]. I will continue to follow up the patient with you and assist in her care.  All questions were answered. The patient knows to call the clinic with any problems, questions or concerns. We can certainly see the patient much sooner  if necessary.  I spent 55 minutes counseling the patient face to face. The total time spent in the appointment was 60 minutes.  Marcy Panning, MD Medical/Oncology [facility] [phone] (beeper) [phone] (Office)

## 2014-02-15 ENCOUNTER — Telehealth: Payer: Self-pay | Admitting: Oncology

## 2014-02-15 ENCOUNTER — Other Ambulatory Visit (INDEPENDENT_AMBULATORY_CARE_PROVIDER_SITE_OTHER): Payer: Self-pay | Admitting: Surgery

## 2014-02-15 ENCOUNTER — Other Ambulatory Visit: Payer: Self-pay | Admitting: *Deleted

## 2014-02-15 ENCOUNTER — Encounter: Payer: Self-pay | Admitting: *Deleted

## 2014-02-15 DIAGNOSIS — R928 Other abnormal and inconclusive findings on diagnostic imaging of breast: Secondary | ICD-10-CM

## 2014-02-15 DIAGNOSIS — C50919 Malignant neoplasm of unspecified site of unspecified female breast: Secondary | ICD-10-CM

## 2014-02-15 NOTE — Telephone Encounter (Signed)
, °

## 2014-02-15 NOTE — Progress Notes (Signed)
[place] Psychosocial Distress Screening Clinical Social Work  Patient completed distress screening protocol, and scored a 2 on the Psychosocial Distress Thermometer which indicates mild distress. Clinical Social Worker met with pt and significant other in [facility] to assess for distress and other psychosocial needs.  Pt stated she felt comfortable with her treatment plan and did not express any concerns at this time.  CSW informed pt of the support team and support services at [facility].  Pt expressed interest in support programs and was agreeable to an [place] referral.  CSW encouraged pt to call with any additional questions or concerns.    Johnnye Lana, MSW, [place] Worker [facility] [phone]

## 2014-02-16 ENCOUNTER — Telehealth: Payer: Self-pay | Admitting: *Deleted

## 2014-02-16 ENCOUNTER — Other Ambulatory Visit (HOSPITAL_COMMUNITY): Payer: Self-pay | Admitting: Surgery

## 2014-02-16 ENCOUNTER — Other Ambulatory Visit: Payer: Self-pay | Admitting: Oncology

## 2014-02-16 ENCOUNTER — Encounter (HOSPITAL_COMMUNITY): Payer: Self-pay | Admitting: Pharmacy Technician

## 2014-02-16 ENCOUNTER — Encounter: Payer: Self-pay | Admitting: *Deleted

## 2014-02-16 ENCOUNTER — Other Ambulatory Visit: Payer: Medicaid Other

## 2014-02-16 DIAGNOSIS — C50412 Malignant neoplasm of upper-outer quadrant of left female breast: Secondary | ICD-10-CM

## 2014-02-16 MED ORDER — LORAZEPAM 0.5 MG PO TABS: 0.5000 mg | ORAL_TABLET | Freq: Four times a day (QID) | ORAL | Status: DC | PRN

## 2014-02-16 MED ORDER — DEXAMETHASONE 4 MG PO TABS: 8.0000 mg | ORAL_TABLET | Freq: Two times a day (BID) | ORAL | Status: DC

## 2014-02-16 MED ORDER — ONDANSETRON HCL 8 MG PO TABS: 8.0000 mg | ORAL_TABLET | Freq: Two times a day (BID) | ORAL | Status: DC

## 2014-02-16 MED ORDER — PROCHLORPERAZINE MALEATE 10 MG PO TABS: 10.0000 mg | ORAL_TABLET | Freq: Four times a day (QID) | ORAL | Status: DC | PRN

## 2014-02-16 NOTE — [facility] Oncology Navigator Note (Signed)
Spoke with patient concerning [place] on 02/10/14.  Patient denies questions or concerns regarding diagnosis or treatment care plan.  Patient reports that she attended her chemo education class this morning and had her questions answered there.  She also received a copy of her appointment schedule.  We confirmed her appointments for biopsy, port placement, CT/PET, ECHO and f/u appointment with Dr. Humphrey Rolls.  I encouraged her to call with any needs.  Patient verbalized understanding.

## 2014-02-16 NOTE — Telephone Encounter (Signed)
Per staff message and POF I have scheduled appts.  JMW  

## 2014-02-18 ENCOUNTER — Encounter (HOSPITAL_COMMUNITY)
Admission: RE | Admit: 2014-02-18 | Discharge: 2014-02-18 | Disposition: A | Discharge status: Home / Self Care | Payer: Medicaid Other | Source: Ambulatory Visit | Attending: Surgery | Admitting: Surgery

## 2014-02-18 ENCOUNTER — Ambulatory Visit (HOSPITAL_COMMUNITY)
Admission: RE | Admit: 2014-02-18 | Discharge: 2014-02-18 | Disposition: A | Discharge status: Home / Self Care | Payer: Medicaid Other | Source: Ambulatory Visit | Attending: Surgery | Admitting: Surgery

## 2014-02-18 ENCOUNTER — Encounter: Payer: Self-pay | Admitting: Oncology

## 2014-02-18 ENCOUNTER — Encounter (HOSPITAL_BASED_OUTPATIENT_CLINIC_OR_DEPARTMENT_OTHER): Payer: Self-pay | Admitting: *Deleted

## 2014-02-18 ENCOUNTER — Encounter (HOSPITAL_COMMUNITY): Payer: Self-pay

## 2014-02-18 DIAGNOSIS — Z01812 Encounter for preprocedural laboratory examination: Secondary | ICD-10-CM | POA: Insufficient documentation

## 2014-02-18 DIAGNOSIS — Z01818 Encounter for other preprocedural examination: Secondary | ICD-10-CM | POA: Insufficient documentation

## 2014-02-18 DIAGNOSIS — Z01810 Encounter for preprocedural cardiovascular examination: Secondary | ICD-10-CM | POA: Insufficient documentation

## 2014-02-18 HISTORY — DX: Rash and other nonspecific skin eruption: R21

## 2014-02-18 HISTORY — DX: Anemia, unspecified: D64.9

## 2014-02-18 LAB — BASIC METABOLIC PANEL
BUN: 13 mg/dL (ref 6–23)
CALCIUM: 9.4 mg/dL (ref 8.4–10.5)
CO2: 26 mEq/L (ref 19–32)
Chloride: 99 mEq/L (ref 96–112)
Creatinine, Ser: 0.87 mg/dL (ref 0.50–1.10)
GFR, EST NON AFRICAN AMERICAN: 82 mL/min — AB (ref >90)
GLUCOSE: 78 mg/dL (ref 70–99)
POTASSIUM: 4.2 meq/L (ref 3.7–5.3)
Sodium: 139 mEq/L (ref 137–147)

## 2014-02-18 LAB — CBC
HCT: 29.3 % — ABNORMAL LOW (ref 36.0–46.0)
HEMOGLOBIN: 8.4 g/dL — AB (ref 12.0–15.0)
MCH: 18.1 pg — ABNORMAL LOW (ref 26.0–34.0)
MCHC: 28.7 g/dL — AB (ref 30.0–36.0)
MCV: 63 fL — ABNORMAL LOW (ref 78.0–100.0)
Platelets: 503 10*3/uL — ABNORMAL HIGH (ref 150–400)
RBC: 4.65 MIL/uL (ref 3.87–5.11)
RDW: 19 % — ABNORMAL HIGH (ref 11.5–15.5)
WBC: 7.9 10*3/uL (ref 4.0–10.5)

## 2014-02-18 LAB — HCG, SERUM, QUALITATIVE: Preg, Serum: NEGATIVE

## 2014-02-18 NOTE — Progress Notes (Signed)
Pt went to [facility] today for preop-labs ekg-surgery r/s here-all preop reviewed

## 2014-02-18 NOTE — Patient Instructions (Addendum)
Emie Sommerfeld  02/18/2014                           YOUR PROCEDURE IS SCHEDULED ON:  02/23/14               PLEASE REPORT TO SHORT STAY CENTER AT : 8:30 am               CALL THIS NUMBER IF ANY PROBLEMS THE DAY OF SURGERY :               [phone]                                REMEMBER:   Do not eat food or drink liquids AFTER MIDNIGHT   May have clear liquids UNTIL 6 HOURS BEFORE SURGERY (5:00)               Take these medicines the morning of surgery with A SIP OF WATER: none   Do not wear jewelry, make-up   Do not wear lotions, powders, or perfumes.   Do not shave legs or underarms 12 hrs. before surgery (men may shave face)  Do not bring valuables to the hospital.  Contacts, dentures or bridgework may not be worn into surgery.  Leave suitcase in the car. After surgery it may be brought to your room.  For patients admitted to the hospital more than one night, checkout time is            11:00 AM                                                       The day of discharge.   Patients discharged the day of surgery will not be allowed to drive home.            If going home same day of surgery, must have someone stay with you              FIRST 24 hrs at home and arrange for some one to drive you              home from hospital.    Special Instructions             Please read over the following fact sheets that you were given:               1. [place]                2. DISCONTINUE ASPIRIN AND HERBAL MEDS 5 DAYS PREOP                                                X_____________________________________________________________________        Failure to follow these instructions may result in cancellation of your surgery

## 2014-02-18 NOTE — Progress Notes (Signed)
DSS per [facility] the case for patient has been closed and she needs to call them. I will call the patient to advise to call

## 2014-02-19 ENCOUNTER — Encounter (HOSPITAL_COMMUNITY)
Admission: RE | Admit: 2014-02-19 | Discharge: 2014-02-19 | Disposition: A | Discharge status: Home / Self Care | Payer: Medicaid Other | Source: Ambulatory Visit | Attending: Oncology | Admitting: Oncology

## 2014-02-19 ENCOUNTER — Encounter (HOSPITAL_COMMUNITY): Payer: Self-pay

## 2014-02-19 ENCOUNTER — Ambulatory Visit (HOSPITAL_BASED_OUTPATIENT_CLINIC_OR_DEPARTMENT_OTHER)
Admission: RE | Admit: 2014-02-19 | Discharge: 2014-02-19 | Disposition: A | Discharge status: Home / Self Care | Payer: Medicaid Other | Source: Ambulatory Visit | Attending: Internal Medicine | Admitting: Internal Medicine

## 2014-02-19 ENCOUNTER — Ambulatory Visit (HOSPITAL_COMMUNITY)
Admission: RE | Admit: 2014-02-19 | Discharge: 2014-02-19 | Disposition: A | Discharge status: Home / Self Care | Payer: Medicaid Other | Source: Ambulatory Visit | Attending: Internal Medicine | Admitting: Internal Medicine

## 2014-02-19 ENCOUNTER — Ambulatory Visit (HOSPITAL_COMMUNITY)
Admission: RE | Admit: 2014-02-19 | Discharge: 2014-02-19 | Disposition: A | Discharge status: Home / Self Care | Payer: Medicaid Other | Source: Ambulatory Visit | Attending: Oncology | Admitting: Oncology

## 2014-02-19 VITALS — BP 106/92 | HR 71 | Ht 65.0 in | Wt 190.0 lb

## 2014-02-19 DIAGNOSIS — R599 Enlarged lymph nodes, unspecified: Secondary | ICD-10-CM | POA: Insufficient documentation

## 2014-02-19 DIAGNOSIS — Z800 Family history of malignant neoplasm of digestive organs: Secondary | ICD-10-CM | POA: Insufficient documentation

## 2014-02-19 DIAGNOSIS — C50419 Malignant neoplasm of upper-outer quadrant of unspecified female breast: Secondary | ICD-10-CM

## 2014-02-19 DIAGNOSIS — C50919 Malignant neoplasm of unspecified site of unspecified female breast: Secondary | ICD-10-CM

## 2014-02-19 DIAGNOSIS — C50412 Malignant neoplasm of upper-outer quadrant of left female breast: Secondary | ICD-10-CM

## 2014-02-19 DIAGNOSIS — I10 Essential (primary) hypertension: Secondary | ICD-10-CM | POA: Insufficient documentation

## 2014-02-19 DIAGNOSIS — Z79899 Other long term (current) drug therapy: Secondary | ICD-10-CM | POA: Insufficient documentation

## 2014-02-19 DIAGNOSIS — D259 Leiomyoma of uterus, unspecified: Secondary | ICD-10-CM | POA: Insufficient documentation

## 2014-02-19 DIAGNOSIS — R911 Solitary pulmonary nodule: Secondary | ICD-10-CM | POA: Insufficient documentation

## 2014-02-19 DIAGNOSIS — Z803 Family history of malignant neoplasm of breast: Secondary | ICD-10-CM | POA: Insufficient documentation

## 2014-02-19 DIAGNOSIS — Z87891 Personal history of nicotine dependence: Secondary | ICD-10-CM | POA: Insufficient documentation

## 2014-02-19 DIAGNOSIS — I509 Heart failure, unspecified: Secondary | ICD-10-CM | POA: Insufficient documentation

## 2014-02-19 DIAGNOSIS — Z171 Estrogen receptor negative status [ER-]: Secondary | ICD-10-CM | POA: Insufficient documentation

## 2014-02-19 DIAGNOSIS — I517 Cardiomegaly: Secondary | ICD-10-CM

## 2014-02-19 DIAGNOSIS — D649 Anemia, unspecified: Secondary | ICD-10-CM | POA: Insufficient documentation

## 2014-02-19 DIAGNOSIS — Z01818 Encounter for other preprocedural examination: Secondary | ICD-10-CM | POA: Insufficient documentation

## 2014-02-19 DIAGNOSIS — C773 Secondary and unspecified malignant neoplasm of axilla and upper limb lymph nodes: Secondary | ICD-10-CM | POA: Insufficient documentation

## 2014-02-19 DIAGNOSIS — Z01810 Encounter for preprocedural cardiovascular examination: Secondary | ICD-10-CM | POA: Insufficient documentation

## 2014-02-19 LAB — GLUCOSE, CAPILLARY: Glucose-Capillary: 86 mg/dL (ref 70–99)

## 2014-02-19 MED ORDER — FLUDEOXYGLUCOSE F - 18 (FDG) INJECTION
9.3000 | Freq: Once | INTRAVENOUS | Status: AC | PRN
  Administered 2014-02-19: 9.3 via INTRAVENOUS

## 2014-02-19 MED ORDER — IOHEXOL 300 MG/ML  SOLN
100.0000 mL | Freq: Once | INTRAMUSCULAR | Status: AC | PRN
  Administered 2014-02-19: 100 mL via INTRAVENOUS

## 2014-02-19 NOTE — Progress Notes (Signed)
Patient ID: Tamara [name], female   DOB: [birth year], 43 y.o.   MRN: [id number] Referring Physician: Dr. Humphrey Rolls Primary Care: [place] Primary Cardiologist: N/A  HPI: 43 yo with a history of HTN and chronic anemia. She was recently diagnosed with L breast cancer. 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 Lymph node was positive for metastatic disease.  Will be treated with Taxotere and carboplatinum/Herceptin/perjeta to be given every 3 weeks for a total of 6 cycles. Once patient completes chemotherapy she will receive Herceptin every 3 weeks to finish out 1 year of Herceptin  ECHO 02/19/2014: EF 45-50%, lat s ' 9.75, GS -16.7%  SH: Does not smoke or drink FH: Mother diagnosed breast CA at age 22, living        2 paternal aunts - breast cancer (1 deceased and 1 living)        Father- Deceased at age 46 of cancer not sure what kind        - Has 41 yo, 41 yo, 41 yo, 41 yo and 41 yo (3 boys and 2 girls)  Review of Systems: [y] = yes, [ ] = no   General: Weight gain [ N]; Weight loss [ ]; Anorexia [ ]; Fatigue [ ]; Fever [ ]; Chills [ ]; Weakness [ ]  Cardiac: Chest pain/pressure Aqua.Slicker ]; Resting SOB Aqua.Slicker ]; Exertional SOB [ N]; Cardell Peach ]; Pedal Edema Aqua.Slicker ]; Palpitations [ ]; Syncope [ ]; Presyncope [ ]; Paroxysmal nocturnal dyspnea[ ]  Pulmonary: Cough [ ]; Wheezing[ ]; Hemoptysis[ ]; Sputum [ ]; Snoring [ ]  GI: Vomiting[ ]; Dysphagia[ ]; Melena[ ]; Hematochezia [ ]; Heartburn[ ]; Abdominal pain [ ]; Constipation [ ]; Diarrhea [ ]; BRBPR [ ]  GU: Hematuria[ ]; Dysuria [ ]; Nocturia[ ]  Vascular: Pain in legs with walking [ ]; Pain in feet with lying flat [ ]; Non-healing sores [ ]; Stroke [ ]; TIA [ ]; Slurred speech [ ];  Neuro: Headaches[ ]; Vertigo[ ]; Seizures[ ]; Paresthesias[ ];Blurred vision [ ]; Diplopia [ ]; Vision changes [ ]  Ortho/Skin: Arthritis [ ]; Joint  pain Aqua.Slicker ]; Muscle pain [ ]; Joint swelling [ ]; Back Pain [ ]; Rash [ ]  Psych: Depression[ N]; Anxiety[ ]  Heme: Bleeding problems [ ]; Clotting disorders [ ]; Anemia [ ]  Endocrine: Diabetes [ ]; Thyroid dysfunction[ ]   Past Medical History  Diagnosis Date  . Endometriosis   . Hypertension   . 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 Rash     fine rash on abd  . Anemia   . Wears glasses     Current Outpatient Prescriptions  Medication Sig Dispense Refill  . dexamethasone (DECADRON) 4 MG tablet Take 2 tablets (8 mg total) by mouth 2 (two) times daily. Start the day before Taxotere. Then again the day after chemo for 3 days.  30 tablet  1  . hydrochlorothiazide (HYDRODIURIL) 25 MG tablet Take 25 mg by mouth every morning.      . lidocaine-prilocaine (EMLA) cream Apply topically as needed.  30 g  6  . LORazepam (ATIVAN) 0.5 MG tablet Take 1 tablet (0.5 mg total) by mouth every 6 (six) hours as needed (Nausea or vomiting).  30 tablet  0  . ondansetron (ZOFRAN) 8 MG tablet Take 1 tablet (8 mg total) by mouth  2 (two) times daily. Start the day after chemo for 3 days. Then take as needed for nausea or vomiting.  30 tablet  1  . prochlorperazine (COMPAZINE) 10 MG tablet Take 1 tablet (10 mg total) by mouth every 6 (six) hours as needed (Nausea or vomiting).  30 tablet  1   No current facility-administered medications for this encounter.    No Known Allergies  History   Social History  . Marital Status: Legally Separated    Spouse Name: N/A    Number of Children: 5  . Years of Education: N/A   Occupational History  . Not on file.   Social History Main Topics  . Smoking status: Former Smoker -- 0.25 packs/day for 15 years    Quit date: 02/18/2009  . Smokeless tobacco: Former Systems developer  . Alcohol Use: Yes     Comment: occasional  . Drug Use: No  . Sexual Activity: Not Currently    Birth Control/ Protection: Surgical   Other Topics Concern  . Not on file   Social History  Narrative  . No narrative on file    Family History  Problem Relation Age of Onset  . Breast cancer Mother 83  . Diabetes Father   . Pancreatic cancer Father 71  . Uterine cancer Maternal Aunt     dx in her 72s-50s  . Breast cancer Paternal Aunt     dx in her 41s  . Stroke Maternal Grandfather   . Breast cancer Paternal Aunt     dx in her 71s-50s  . Breast cancer Cousin     paternal cousin dx in her 44s-40s     Filed Vitals:   02/19/14 1029  BP: 106/92  Pulse: 71  Height: 5' 5" (1.651 m)  Weight: 190 lb (86.183 kg)  SpO2: 100%   PHYSICAL EXAM: General:  Well appearing. No respiratory difficulty HEENT: normal Neck: supple. no JVD. Carotids 2+ bilat; no bruits. No lymphadenopathy or thryomegaly appreciated. Cor: PMI nondisplaced. Regular rate & rhythm. No rubs, gallops or murmurs. Lungs: clear Abdomen: soft, nontender, nondistended. No hepatosplenomegaly. No bruits or masses. Good bowel sounds. Extremities: no cyanosis, clubbing, rash, edema Neuro: alert & oriented x 3, cranial nerves grossly intact. moves all 4 extremities w/o difficulty. Affect pleasant.  ASSESSMENT & PLAN:  1) Breast Cancer L: HER-2/neu positive with a proliferation marker Ki-67 79%. - First visit to the HF clinic. Discussed the role of the Heart Failure Clinic and that we will be monitoring her ECHOs every 3 months. We talked about the risks and that there is about 10% of ppl that their heart is affected from Herceptin and perjeta. Discussed symptoms of HF and to call the office if she notices any SOB, orthopnea, or weight gain.  - Dr. Haroldine Laws reviewed ECHO and EF 45-50%, lateral s' 9.75 and GS= -16.7% - Continue Herceptin/perjeta and repeat ECHO in 3 months. 2) HTN - Newly diagnosed. Stable, continue HCTZ.   Junie Bame B NP-C 11:21 AM  Patient seen and examined with Junie Bame, NP. We discussed all aspects of the encounter. I agree with the assessment and plan as stated above. Echo  reviewed personally. Baseline EF on low normal end ~50% (prior to chemo). All other parameters in normal range. Can proceed with chemo/herceptin/perjeta. Will follow EF closely. Discussed role of [facility] at length.   Benay Spice 10:38 PM

## 2014-02-19 NOTE — Progress Notes (Signed)
  Echocardiogram 2D Echocardiogram has been performed.  [place], [place] 02/19/2014, 10:22 AM

## 2014-02-19 NOTE — Patient Instructions (Signed)
Will repeat ECHO in 3 months.  Call any issues.  Do the following things EVERYDAY: 1) Weigh yourself in the morning before breakfast. Write it down and keep it in a log. 2) Take your medicines as prescribed 3) Eat low salt foods-Limit salt (sodium) to 2000 mg per day.  4) Stay as active as you can everyday 5) Limit all fluids for the day to less than 2 liters

## 2014-02-22 ENCOUNTER — Encounter: Payer: Self-pay | Admitting: Genetic Counselor

## 2014-02-22 ENCOUNTER — Ambulatory Visit
Admission: RE | Admit: 2014-02-22 | Discharge: 2014-02-22 | Disposition: A | Discharge status: Home / Self Care | Payer: Medicaid Other | Source: Ambulatory Visit | Attending: Surgery | Admitting: Surgery

## 2014-02-22 DIAGNOSIS — Z1509 Genetic susceptibility to other malignant neoplasm: Secondary | ICD-10-CM

## 2014-02-22 DIAGNOSIS — Z1501 Genetic susceptibility to malignant neoplasm of breast: Secondary | ICD-10-CM | POA: Insufficient documentation

## 2014-02-22 DIAGNOSIS — R928 Other abnormal and inconclusive findings on diagnostic imaging of breast: Secondary | ICD-10-CM

## 2014-02-22 MED ORDER — GADOBENATE DIMEGLUMINE 529 MG/ML IV SOLN
18.0000 mL | Freq: Once | INTRAVENOUS | Status: AC | PRN
  Administered 2014-02-22: 18 mL via INTRAVENOUS

## 2014-02-23 ENCOUNTER — Telehealth: Payer: Self-pay | Admitting: *Deleted

## 2014-02-23 ENCOUNTER — Ambulatory Visit (HOSPITAL_BASED_OUTPATIENT_CLINIC_OR_DEPARTMENT_OTHER): Payer: Medicaid Other | Admitting: Anesthesiology

## 2014-02-23 ENCOUNTER — Encounter (HOSPITAL_BASED_OUTPATIENT_CLINIC_OR_DEPARTMENT_OTHER): Payer: Self-pay | Admitting: *Deleted

## 2014-02-23 ENCOUNTER — Ambulatory Visit (HOSPITAL_COMMUNITY): Admission: RE | Admit: 2014-02-23 | Payer: PRIVATE HEALTH INSURANCE | Source: Ambulatory Visit | Admitting: Surgery

## 2014-02-23 ENCOUNTER — Encounter (HOSPITAL_BASED_OUTPATIENT_CLINIC_OR_DEPARTMENT_OTHER)
Admission: RE | Disposition: A | Discharge status: Home / Self Care | Payer: Self-pay | Source: Ambulatory Visit | Attending: Surgery

## 2014-02-23 ENCOUNTER — Encounter (HOSPITAL_BASED_OUTPATIENT_CLINIC_OR_DEPARTMENT_OTHER): Payer: Medicaid Other | Admitting: Anesthesiology

## 2014-02-23 ENCOUNTER — Ambulatory Visit (HOSPITAL_COMMUNITY): Payer: Medicaid Other

## 2014-02-23 ENCOUNTER — Telehealth: Payer: Self-pay | Admitting: Genetic Counselor

## 2014-02-23 ENCOUNTER — Ambulatory Visit (HOSPITAL_BASED_OUTPATIENT_CLINIC_OR_DEPARTMENT_OTHER)
Admission: RE | Admit: 2014-02-23 | Discharge: 2014-02-23 | Disposition: A | Discharge status: Home / Self Care | Payer: Medicaid Other | Source: Ambulatory Visit | Attending: Surgery | Admitting: Surgery

## 2014-02-23 ENCOUNTER — Encounter (HOSPITAL_COMMUNITY): Admission: RE | Payer: Self-pay | Source: Ambulatory Visit

## 2014-02-23 DIAGNOSIS — Z1509 Genetic susceptibility to other malignant neoplasm: Secondary | ICD-10-CM

## 2014-02-23 DIAGNOSIS — N6321 Unspecified lump in the left breast, upper outer quadrant: Secondary | ICD-10-CM

## 2014-02-23 DIAGNOSIS — C50919 Malignant neoplasm of unspecified site of unspecified female breast: Secondary | ICD-10-CM

## 2014-02-23 DIAGNOSIS — C50412 Malignant neoplasm of upper-outer quadrant of left female breast: Secondary | ICD-10-CM

## 2014-02-23 DIAGNOSIS — Z1501 Genetic susceptibility to malignant neoplasm of breast: Secondary | ICD-10-CM

## 2014-02-23 DIAGNOSIS — N809 Endometriosis, unspecified: Secondary | ICD-10-CM | POA: Insufficient documentation

## 2014-02-23 DIAGNOSIS — D649 Anemia, unspecified: Secondary | ICD-10-CM | POA: Insufficient documentation

## 2014-02-23 DIAGNOSIS — Z87891 Personal history of nicotine dependence: Secondary | ICD-10-CM | POA: Insufficient documentation

## 2014-02-23 DIAGNOSIS — C50419 Malignant neoplasm of upper-outer quadrant of unspecified female breast: Secondary | ICD-10-CM | POA: Insufficient documentation

## 2014-02-23 DIAGNOSIS — I10 Essential (primary) hypertension: Secondary | ICD-10-CM | POA: Insufficient documentation

## 2014-02-23 HISTORY — PX: PORTACATH PLACEMENT: SHX2246

## 2014-02-23 HISTORY — DX: Presence of spectacles and contact lenses: Z97.3

## 2014-02-23 LAB — POCT HEMOGLOBIN-HEMACUE: HEMOGLOBIN: 7.8 g/dL — AB (ref 12.0–15.0)

## 2014-02-23 SURGERY — INSERTION, TUNNELED CENTRAL VENOUS DEVICE, WITH PORT
Anesthesia: General

## 2014-02-23 SURGERY — INSERTION, TUNNELED CENTRAL VENOUS DEVICE, WITH PORT
Anesthesia: General | Site: Chest | Laterality: Right

## 2014-02-23 MED ORDER — HEPARIN SOD (PORK) LOCK FLUSH 100 UNIT/ML IV SOLN
INTRAVENOUS | Status: DC | PRN
  Administered 2014-02-23: 400 [IU] via INTRAVENOUS

## 2014-02-23 MED ORDER — HYDROMORPHONE HCL PF 1 MG/ML IJ SOLN
INTRAMUSCULAR | Status: AC
  Filled 2014-02-23: qty 1

## 2014-02-23 MED ORDER — CEFAZOLIN SODIUM-DEXTROSE 2-3 GM-% IV SOLR
2.0000 g | INTRAVENOUS | Status: AC
  Administered 2014-02-23: 2 g via INTRAVENOUS

## 2014-02-23 MED ORDER — HEPARIN SOD (PORK) LOCK FLUSH 100 UNIT/ML IV SOLN
INTRAVENOUS | Status: AC
Start: 2014-02-23 — End: 2014-02-23
  Filled 2014-02-23: qty 5

## 2014-02-23 MED ORDER — OXYCODONE HCL 5 MG PO TABS
ORAL_TABLET | ORAL | Status: AC
  Filled 2014-02-23: qty 1

## 2014-02-23 MED ORDER — BUPIVACAINE-EPINEPHRINE PF 0.25-1:200000 % IJ SOLN
INTRAMUSCULAR | Status: AC
  Filled 2014-02-23: qty 30

## 2014-02-23 MED ORDER — MIDAZOLAM HCL 5 MG/5ML IJ SOLN
INTRAMUSCULAR | Status: DC | PRN
  Administered 2014-02-23: 2 mg via INTRAVENOUS

## 2014-02-23 MED ORDER — MIDAZOLAM HCL 2 MG/2ML IJ SOLN
INTRAMUSCULAR | Status: AC
  Filled 2014-02-23: qty 2

## 2014-02-23 MED ORDER — CHLORHEXIDINE GLUCONATE 4 % EX LIQD: 1.0000 "application " | Freq: Once | CUTANEOUS | Status: DC

## 2014-02-23 MED ORDER — HEPARIN (PORCINE) IN NACL 2-0.9 UNIT/ML-% IJ SOLN
INTRAMUSCULAR | Status: DC | PRN
  Administered 2014-02-23: 500 mL via INTRAVENOUS

## 2014-02-23 MED ORDER — OXYCODONE HCL 5 MG PO TABS
5.0000 mg | ORAL_TABLET | Freq: Once | ORAL | Status: AC | PRN
  Administered 2014-02-23: 5 mg via ORAL

## 2014-02-23 MED ORDER — FENTANYL CITRATE 0.05 MG/ML IJ SOLN
INTRAMUSCULAR | Status: AC
  Filled 2014-02-23: qty 2

## 2014-02-23 MED ORDER — HYDROMORPHONE HCL PF 1 MG/ML IJ SOLN
0.2500 mg | INTRAMUSCULAR | Status: DC | PRN
  Administered 2014-02-23 (×2): 0.5 mg via INTRAVENOUS

## 2014-02-23 MED ORDER — OXYCODONE HCL 5 MG/5ML PO SOLN: 5.0000 mg | Freq: Once | ORAL | Status: AC | PRN

## 2014-02-23 MED ORDER — ONDANSETRON HCL 4 MG/2ML IJ SOLN
INTRAMUSCULAR | Status: DC | PRN
Start: 2014-02-23 — End: 2014-02-23
  Administered 2014-02-23: 4 mg via INTRAVENOUS

## 2014-02-23 MED ORDER — BUPIVACAINE-EPINEPHRINE 0.25% -1:200000 IJ SOLN
INTRAMUSCULAR | Status: DC | PRN
  Administered 2014-02-23: 8 mL

## 2014-02-23 MED ORDER — DEXAMETHASONE SODIUM PHOSPHATE 4 MG/ML IJ SOLN
INTRAMUSCULAR | Status: DC | PRN
  Administered 2014-02-23: 10 mg via INTRAVENOUS

## 2014-02-23 MED ORDER — OXYCODONE-ACETAMINOPHEN 5-325 MG PO TABS: 1.0000 | ORAL_TABLET | ORAL | Status: DC | PRN

## 2014-02-23 MED ORDER — LACTATED RINGERS IV SOLN
INTRAVENOUS | Status: DC
  Administered 2014-02-23 (×2): via INTRAVENOUS

## 2014-02-23 MED ORDER — HEPARIN (PORCINE) IN NACL 2-0.9 UNIT/ML-% IJ SOLN
INTRAMUSCULAR | Status: AC
  Filled 2014-02-23: qty 500

## 2014-02-23 MED ORDER — FENTANYL CITRATE 0.05 MG/ML IJ SOLN
INTRAMUSCULAR | Status: DC | PRN
  Administered 2014-02-23: 50 ug via INTRAVENOUS
  Administered 2014-02-23: 100 ug via INTRAVENOUS
  Administered 2014-02-23: 50 ug via INTRAVENOUS

## 2014-02-23 MED ORDER — CEFAZOLIN SODIUM-DEXTROSE 2-3 GM-% IV SOLR
INTRAVENOUS | Status: AC
  Filled 2014-02-23: qty 50

## 2014-02-23 MED ORDER — PROPOFOL 10 MG/ML IV BOLUS
INTRAVENOUS | Status: DC | PRN
  Administered 2014-02-23: 200 mg via INTRAVENOUS

## 2014-02-23 MED ORDER — PROMETHAZINE HCL 25 MG/ML IJ SOLN: 6.2500 mg | INTRAMUSCULAR | Status: DC | PRN

## 2014-02-23 MED ORDER — LIDOCAINE HCL (CARDIAC) 20 MG/ML IV SOLN
INTRAVENOUS | Status: DC | PRN
  Administered 2014-02-23: 100 mg via INTRAVENOUS

## 2014-02-23 SURGICAL SUPPLY — 60 items
BAG DECANTER FOR FLEXI CONT (MISCELLANEOUS) ×3 IMPLANT
BENZOIN TINCTURE PRP APPL 2/3 (GAUZE/BANDAGES/DRESSINGS) IMPLANT
BLADE SURG 11 STRL SS (BLADE) ×3 IMPLANT
BLADE SURG 15 STRL LF DISP TIS (BLADE) ×1 IMPLANT
BLADE SURG 15 STRL SS (BLADE) ×2
CANISTER SUCT 1200ML W/VALVE (MISCELLANEOUS) IMPLANT
CHLORAPREP W/TINT 26ML (MISCELLANEOUS) ×3 IMPLANT
CLEANER CAUTERY TIP 5X5 PAD (MISCELLANEOUS) ×1 IMPLANT
CLOSURE WOUND 1/2 X4 (GAUZE/BANDAGES/DRESSINGS)
COVER MAYO STAND STRL (DRAPES) ×3 IMPLANT
COVER TABLE BACK 60X90 (DRAPES) ×3 IMPLANT
DECANTER SPIKE VIAL GLASS SM (MISCELLANEOUS) IMPLANT
DERMABOND ADVANCED (GAUZE/BANDAGES/DRESSINGS) ×2
DERMABOND ADVANCED .7 DNX12 (GAUZE/BANDAGES/DRESSINGS) ×1 IMPLANT
DRAPE C-ARM 42X72 X-RAY (DRAPES) ×3 IMPLANT
DRAPE LAPAROSCOPIC ABDOMINAL (DRAPES) ×3 IMPLANT
DRAPE UTILITY XL STRL (DRAPES) ×3 IMPLANT
DRSG TEGADERM 2-3/8X2-3/4 SM (GAUZE/BANDAGES/DRESSINGS) IMPLANT
ELECT REM PT RETURN 9FT ADLT (ELECTROSURGICAL) ×3
ELECTRODE REM PT RTRN 9FT ADLT (ELECTROSURGICAL) ×1 IMPLANT
GLOVE BIOGEL M 7.0 STRL (GLOVE) ×6 IMPLANT
GLOVE BIOGEL PI IND STRL 7.5 (GLOVE) ×3 IMPLANT
GLOVE BIOGEL PI IND STRL 8 (GLOVE) ×1 IMPLANT
GLOVE BIOGEL PI INDICATOR 7.5 (GLOVE) ×6
GLOVE BIOGEL PI INDICATOR 8 (GLOVE) ×2
GLOVE ECLIPSE 6.5 STRL STRAW (GLOVE) ×3 IMPLANT
GLOVE ECLIPSE 8.0 STRL XLNG CF (GLOVE) ×3 IMPLANT
GOWN STRL REUS W/ TWL LRG LVL3 (GOWN DISPOSABLE) ×4 IMPLANT
GOWN STRL REUS W/TWL LRG LVL3 (GOWN DISPOSABLE) ×8
IV KIT MINILOC 20X1 SAFETY (NEEDLE) IMPLANT
KIT PORT POWER 8FR ISP CVUE (Catheter) ×6 IMPLANT
KIT PROBE COVER (MISCELLANEOUS) ×3 IMPLANT
NDL SAFETY ECLIPSE 18X1.5 (NEEDLE) IMPLANT
NEEDLE HYPO 18GX1.5 SHARP (NEEDLE)
NEEDLE HYPO 22GX1.5 SAFETY (NEEDLE) IMPLANT
NEEDLE HYPO 25X1 1.5 SAFETY (NEEDLE) ×3 IMPLANT
NEEDLE SPNL 22GX3.5 QUINCKE BK (NEEDLE) IMPLANT
PACK BASIN DAY SURGERY FS (CUSTOM PROCEDURE TRAY) ×3 IMPLANT
PAD CLEANER CAUTERY TIP 5X5 (MISCELLANEOUS) ×2
PENCIL BUTTON HOLSTER BLD 10FT (ELECTRODE) ×3 IMPLANT
SET SHEATH INTRODUCER 10FR (MISCELLANEOUS) IMPLANT
SHEATH COOK PEEL AWAY SET 9F (SHEATH) IMPLANT
SLEEVE SCD COMPRESS KNEE MED (MISCELLANEOUS) ×3 IMPLANT
SPONGE GAUZE 4X4 12PLY STER LF (GAUZE/BANDAGES/DRESSINGS) IMPLANT
SPONGE LAP 4X18 X RAY DECT (DISPOSABLE) IMPLANT
STRIP CLOSURE SKIN 1/2X4 (GAUZE/BANDAGES/DRESSINGS) IMPLANT
SUT MON AB 4-0 PC3 18 (SUTURE) ×3 IMPLANT
SUT PROLENE 2 0 CT2 30 (SUTURE) IMPLANT
SUT PROLENE 2 0 SH DA (SUTURE) ×3 IMPLANT
SUT SILK 2 0 TIES 17X18 (SUTURE)
SUT SILK 2-0 18XBRD TIE BLK (SUTURE) IMPLANT
SUT VIC AB 3-0 SH 27 (SUTURE) ×2
SUT VIC AB 3-0 SH 27X BRD (SUTURE) ×1 IMPLANT
SYR 5ML LUER SLIP (SYRINGE) ×3 IMPLANT
SYR CONTROL 10ML LL (SYRINGE) ×3 IMPLANT
TOWEL OR 17X24 6PK STRL BLUE (TOWEL DISPOSABLE) ×3 IMPLANT
TOWEL OR NON WOVEN STRL DISP B (DISPOSABLE) IMPLANT
TUBE CONNECTING 20'X1/4 (TUBING)
TUBE CONNECTING 20X1/4 (TUBING) IMPLANT
YANKAUER SUCT BULB TIP NO VENT (SUCTIONS) IMPLANT

## 2014-02-23 NOTE — Brief Op Note (Signed)
02/23/2014  12:21 PM  PATIENT:  Tamara [name]  42 y.o. female  PRE-OPERATIVE DIAGNOSIS:  breast cancer  POST-OPERATIVE DIAGNOSIS:  breast cancer  PROCEDURE:  Procedure(s): INSERTION PORT-A-CATH (Right)  SURGEON:  Surgeon(s) and Role:    * Errick Salts A. Jenasis Straley, MD - Primary     ASSISTANTS: none   ANESTHESIA:   local and general  EBL:  Total I/O In: 1000 [I.V.:1000] Out: -   BLOOD ADMINISTERED:none  DRAINS: none   LOCAL MEDICATIONS USED:  BUPIVICAINE   SPECIMEN:  No Specimen  DISPOSITION OF SPECIMEN:  N/A  COUNTS:  YES  TOURNIQUET:  * No tourniquets in log *  DICTATION: .Other Dictation: Dictation Number  [id number]  PLAN OF CARE: Discharge to home after PACU  PATIENT DISPOSITION:  PACU - hemodynamically stable.   Delay start of Pharmacological VTE agent (>24hrs) due to surgical blood loss or risk of bleeding: not applicable

## 2014-02-23 NOTE — Anesthesia Preprocedure Evaluation (Addendum)
Anesthesia Evaluation  Patient identified by MRN, date of birth, ID band Patient awake    Reviewed: Allergy & Precautions, H&P , NPO status , Patient's Chart, lab work & pertinent test results  Airway Mallampati: II TM Distance: >3 FB Neck ROM: full    Dental  (+) Teeth Intact, Dental Advidsory Given   Pulmonary neg pulmonary ROS, former smoker,  breath sounds clear to auscultation        Cardiovascular hypertension, negative cardio ROS  Rhythm:regular Rate:Normal     Neuro/Psych negative neurological ROS  negative psych ROS   GI/Hepatic negative GI ROS, Neg liver ROS,   Endo/Other  negative endocrine ROS  Renal/GU negative Renal ROS     Musculoskeletal   Abdominal   Peds  Hematology  (+) anemia ,   Anesthesia Other Findings   Reproductive/Obstetrics negative OB ROS                          Anesthesia Physical Anesthesia Plan  ASA: II  Anesthesia Plan: General LMA   Post-op Pain Management:    Induction:   Airway Management Planned:   Additional Equipment:   Intra-op Plan:   Post-operative Plan:   Informed Consent: I have reviewed the patients History and Physical, chart, labs and discussed the procedure including the risks, benefits and alternatives for the proposed anesthesia with the patient or authorized representative who has indicated his/her understanding and acceptance.   Dental Advisory Given  Plan Discussed with: Anesthesiologist, CRNA and Surgeon  Anesthesia Plan Comments:         Anesthesia Quick Evaluation

## 2014-02-23 NOTE — Telephone Encounter (Signed)
Left message that we had her test results and to please call back.

## 2014-02-23 NOTE — Interval H&P Note (Signed)
History and Physical Interval Note:  02/23/2014 10:38 AM  Tamara [name]  has presented today for surgery, with the diagnosis of breast cancer  The various methods of treatment have been discussed with the patient and family. After consideration of risks, benefits and other options for treatment, the patient has consented to  Procedure(s): INSERTION PORT-A-CATH (N/A) as a surgical intervention .  [name] history has been reviewed, patient examined, no change in status, stable for surgery.  I have reviewed the patient's chart and labs.  Questions were answered to the patient's satisfaction.     Tamara [name] A.

## 2014-02-23 NOTE — Telephone Encounter (Signed)
Message copied by Amelia Jo I on Tue Feb 23, 2014  1:00 PM ------      Message from: Deatra Robinson      Created: Mon Feb 22, 2014  8:08 AM       Scans: no evidence of distant cancer ------

## 2014-02-23 NOTE — Discharge Instructions (Signed)
Implanted Port Home Guide °An implanted port is a type of central line that is placed under the skin. Central lines are used to provide IV access when treatment or nutrition needs to be given through a person's veins. Implanted ports are used for long-term IV access. An implanted port may be placed because:  °· You need IV medicine that would be irritating to the small veins in your hands or arms.   °· You need long-term IV medicines, such as antibiotics.   °· You need IV nutrition for a long period.   °· You need frequent blood draws for lab tests.   °· You need dialysis.   °Implanted ports are usually placed in the chest area, but they can also be placed in the upper arm, the abdomen, or the leg. An implanted port has two main parts:  °· Reservoir. The reservoir is round and will appear as a small, raised area under your skin. The reservoir is the part where a needle is inserted to give medicines or draw blood.   °· Catheter. The catheter is a thin, flexible tube that extends from the reservoir. The catheter is placed into a large vein. Medicine that is inserted into the reservoir goes into the catheter and then into the vein.   °HOW WILL I CARE FOR MY INCISION SITE? °Do not get the incision site wet. Bathe or shower as directed by your health care provider.  °HOW IS MY PORT ACCESSED? °Special steps must be taken to access the port:  °· Before the port is accessed, a numbing cream can be placed on the skin. This helps numb the skin over the port site.   °· Your health care provider uses a sterile technique to access the port. °· Your health care provider must put on a mask and sterile gloves. °· The skin over your port is cleaned carefully with an antiseptic and allowed to dry. °· The port is gently pinched between sterile gloves, and a needle is inserted into the port. °· Only "non-coring" port needles should be used to access the port. Once the port is accessed, a blood return should be checked. This helps  ensure that the port is in the vein and is not clogged.   °· If your port needs to remain accessed for a constant infusion, a clear (transparent) bandage will be placed over the needle site. The bandage and needle will need to be changed every week, or as directed by your health care provider.   °· Keep the bandage covering the needle clean and dry. Do not get it wet. Follow your health care provider's instructions on how to take a shower or bath while the port is accessed.   °· If your port does not need to stay accessed, no bandage is needed over the port.   °WHAT IS FLUSHING? °Flushing helps keep the port from getting clogged. Follow your health care provider's instructions on how and when to flush the port. Ports are usually flushed with saline solution or a medicine called heparin. The need for flushing will depend on how the port is used.  °· If the port is used for intermittent medicines or blood draws, the port will need to be flushed:   °· After medicines have been given.   °· After blood has been drawn.   °· As part of routine maintenance.   °· If a constant infusion is running, the port may not need to be flushed.   °HOW LONG WILL MY PORT STAY IMPLANTED? °The port can stay in for as long as your health care   provider thinks it is needed. When it is time for the port to come out, surgery will be done to remove it. The procedure is similar to the one performed when the port was put in.  °WHEN SHOULD I SEEK IMMEDIATE MEDICAL CARE? °When you have an implanted port, you should seek immediate medical care if:  °· You notice a bad smell coming from the incision site.   °· You have swelling, redness, or drainage at the incision site.   °· You have more swelling or pain at the port site or the surrounding area.   °· You have a fever that is not controlled with medicine. °Document Released: 11/19/2005 Document Revised: 09/09/2013 Document Reviewed: 07/27/2013 °Exit[employer]® Patient Information ©2014 [employer]  [employer]. ° °Post Anesthesia Home Care Instructions ° °Activity: °Get plenty of rest for the remainder of the day. A responsible adult should stay with you for 24 hours following the procedure.  °For the next 24 hours, DO NOT: °-Drive a car °-Operate machinery °-Drink alcoholic beverages °-Take any medication unless instructed by your physician °-Make any legal decisions or sign important papers. ° °Meals: °Start with liquid foods such as gelatin or soup. Progress to regular foods as tolerated. Avoid greasy, spicy, heavy foods. If nausea and/or vomiting occur, drink only clear liquids until the nausea and/or vomiting subsides. Call your physician if vomiting continues. ° °Special Instructions/Symptoms: °Your throat may feel dry or sore from the anesthesia or the breathing tube placed in your throat during surgery. If this causes discomfort, gargle with warm salt water. The discomfort should disappear within 24 hours. ° °

## 2014-02-23 NOTE — Transfer of Care (Signed)
Immediate Anesthesia Transfer of Care Note  Patient: Tamara [name]  Procedure(s) Performed: Procedure(s): INSERTION PORT-A-CATH (Right)  Patient Location: PACU  Anesthesia Type:General  Level of Consciousness: awake  Airway & Oxygen Therapy: Patient Spontanous Breathing and Patient connected to face mask oxygen  Post-op Assessment: Report given to PACU RN and Post -op Vital signs reviewed and stable  Post vital signs: Reviewed and stable  Complications: No apparent anesthesia complications

## 2014-02-23 NOTE — Anesthesia Postprocedure Evaluation (Signed)
  Anesthesia Post-op Note  Patient: Tamara [name]  Procedure(s) Performed: Procedure(s): INSERTION PORT-A-CATH (Right)  Patient Location: PACU  Anesthesia Type:General  Level of Consciousness: awake and alert   Airway and Oxygen Therapy: Patient Spontanous Breathing  Post-op Pain: mild  Post-op Assessment: Post-op Vital signs reviewed, Patient's Cardiovascular Status Stable and Respiratory Function Stable  Post-op Vital Signs: Reviewed  Filed Vitals:   02/23/14 1330  BP: 118/70  Pulse: 69  Temp:   Resp: 20    Complications: No apparent anesthesia complications

## 2014-02-23 NOTE — H&P (View-Only) (Signed)
Patient ID: Tamara [name], female   DOB: 1971-11-27, 43 y.o.   MRN: [id number]  No chief complaint on file.   HPI Tamara [name] is a 43 y.o. female.  Pt sent at request of Dr Curlene Dolphin for left breast cancer.  Two foci of cancer notes and positIve LN bx IDC 3.2 CM AND 8 MM respectively in different quadrants.  HPI  Past Medical History  Diagnosis Date  . Endometriosis   . Hypertension     Past Surgical History  Procedure Laterality Date  . Palups removed      2010  . Cervical polypectomy    . Cesarean section      one previous  . Tubal ligation      Family History  Problem Relation Age of Onset  . Breast cancer Mother   . Diabetes Father     Social History History  Substance Use Topics  . Smoking status: Never Smoker   . Smokeless tobacco: Never Used  . Alcohol Use: No    No Known Allergies  Current Outpatient Prescriptions  Medication Sig Dispense Refill  . cephALEXin (KEFLEX) 500 MG capsule Take 1 capsule (500 mg total) by mouth 4 (four) times daily.  28 capsule  0  . ferrous sulfate 325 (65 FE) MG tablet Take 1 tablet (325 mg total) by mouth 2 (two) times daily with a meal.  60 tablet  3  . hydrochlorothiazide (HYDRODIURIL) 25 MG tablet Take 1 tablet (25 mg total) by mouth daily.  30 tablet  2  . oxyCODONE-acetaminophen (ROXICET) 5-325 MG per tablet Take 1 tablet by mouth every 4 (four) hours as needed for severe pain.  16 tablet  0   No current facility-administered medications for this visit.    Review of Systems Review of Systems  Constitutional: Negative for fever, chills and unexpected weight change.  HENT: Negative for congestion, hearing loss, sore throat, trouble swallowing and voice change.   Eyes: Negative for visual disturbance.  Respiratory: Negative for cough and wheezing.   Cardiovascular: Negative for chest pain, palpitations and leg swelling.  Gastrointestinal: Negative for nausea, vomiting, abdominal pain, diarrhea, constipation, blood in  stool, abdominal distention and anal bleeding.  Genitourinary: Negative for hematuria, vaginal bleeding and difficulty urinating.  Musculoskeletal: Negative for arthralgias.  Skin: Negative for rash and wound.  Neurological: Negative for seizures, syncope and headaches.  Hematological: Negative for adenopathy. Does not bruise/bleed easily.  Psychiatric/Behavioral: Negative for confusion.    Last menstrual period 01/02/2014.  Physical Exam Physical Exam  Constitutional: She is oriented to person, place, and time. She appears well-developed and well-nourished.  HENT:  Head: Normocephalic and atraumatic.  Eyes: EOM are normal. Pupils are equal, round, and reactive to light.  Neck: Normal range of motion. Neck supple.  Cardiovascular: Normal rate and regular rhythm.   Pulmonary/Chest: Effort normal. Right breast exhibits no inverted nipple, no mass, no nipple discharge, no skin change and no tenderness. Left breast exhibits mass and tenderness. Left breast exhibits no inverted nipple, no nipple discharge and no skin change. Breasts are symmetrical.    Musculoskeletal: Normal range of motion.  Lymphadenopathy:    She has no cervical adenopathy.    She has no axillary adenopathy.  Neurological: She is alert and oriented to person, place, and time.  Skin: Skin is warm and dry.  Psychiatric: She has a normal mood and affect. Her behavior is normal. Judgment and thought content normal.    Data Reviewed CLINICAL DATA: Patient recently underwent ultrasound-guided  core  biopsies of masses in the left breast at 2 o'clock 7 cm from the  nipple and at 2 o'clock 9 cm from the nipple. The patient also had a  left axillary lymph node biopsied. 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 The lymph node was positive for metastatic mammary  carcinoma.  EXAM:  BILATERAL BREAST MRI WITH AND WITHOUT CONTRAST  LABS: None.  TECHNIQUE:    Multiplanar, multisequence MR images of both breasts were obtained  prior to and following the intravenous administration of 5ml of  MultiHance.  THREE-DIMENSIONAL MR IMAGE RENDERING ON INDEPENDENT WORKSTATION:  Three-dimensional MR images were rendered by post-processing of the  original MR data on an independent workstation. The  three-dimensional MR images were interpreted, and findings are  reported in the following complete MRI report for this study. Three  dimensional images were evaluated at the independent [employer]  workstation  COMPARISON: Previous exams  FINDINGS:  Breast composition: c: Heterogeneous fibroglandular tissue  Background parenchymal enhancement: Moderate  Right breast: In the anterior third of the 9 o'clock subareolar  region of the right breast there is an enhancing irregular 6 x 3 x 6  mm mass.  Left breast: There is an irregular enhancing mass in the posterior/  middle third of the upper-outer quadrant of the left breast  measuring 2.8 x 2.5 x 3.1 cm. There an irregular enhancing mass in  the posterior third of the upper-outer quadrant of the left breast  measuring 1.3 x 0.8 x 0.8 cm. The smaller mass is located 2 cm  superior and posterior to the larger mass. Both masses span an area  of 5 cm. Signal void artifacts are seen in both masses from the  biopsy clips.  Lymph nodes: In the right axilla several level 1 lymph nodes have  slightly thickened cortices.  In the left axilla there are pathologically enlarged level 1 lymph  nodes with the largest measuring 2.4 cm.  Ancillary findings: None.  IMPRESSION:  Two enhancing masses in the upper-outer quadrant of the left breast  with enlarged axillary adenopathy corresponding with the patient's  known areas of invasive ductal carcinoma and DCIS as well as  axillary metastasis.  Enhancing mass in the 9 o'clock subareolar region of the right  breast as well as mildly prominent right axillary adenopathy.   Further evaluation with ultrasound is recommended. If the right  subareolar mass is not seen sonographically MR guided core biopsy  would be suggested.  RECOMMENDATION:  Ultrasound of the right breast and right axilla is recommended. The  patient will be recalled for the additional imaging.  Treatment planning of the patient's known left breast cancers and  axillary metastasis is recommended.  BI-RADS CATEGORY 4: Suspicious abnormality - biopsy should be ER-PR- her 2 neu positive  Assessment    Multicentric left breast cancer and positive LN axilla.  May be able to conserve breast.     Plan    Neoadjuvant chemotherapy.  Pt needs port and have discussed this with her.  Risk of PTX,  Hemothorax,  Organ injury,  Catheter migration and replacement discussed,The procedure has been discussed with the patient.  Alternative therapies have been discussed with the patient.  Operative risks include bleeding,  Infection,  Organ injury,  Nerve injury,  Blood vessel injury,  DVT,  Pulmonary embolism,  Death,  And possible reoperation.    .  The patient understands and agrees to  proceed.       Ski Polich A. 02/10/2014, 11:12 AM

## 2014-02-24 ENCOUNTER — Encounter (HOSPITAL_BASED_OUTPATIENT_CLINIC_OR_DEPARTMENT_OTHER): Payer: Self-pay | Admitting: Surgery

## 2014-02-25 ENCOUNTER — Encounter: Payer: Self-pay | Admitting: Oncology

## 2014-02-25 ENCOUNTER — Ambulatory Visit (HOSPITAL_BASED_OUTPATIENT_CLINIC_OR_DEPARTMENT_OTHER): Payer: Medicaid Other | Admitting: Oncology

## 2014-02-25 ENCOUNTER — Telehealth: Payer: Self-pay | Admitting: *Deleted

## 2014-02-25 ENCOUNTER — Other Ambulatory Visit (HOSPITAL_BASED_OUTPATIENT_CLINIC_OR_DEPARTMENT_OTHER): Payer: Medicaid Other

## 2014-02-25 ENCOUNTER — Telehealth: Payer: Self-pay

## 2014-02-25 ENCOUNTER — Telehealth: Payer: Self-pay | Admitting: Oncology

## 2014-02-25 ENCOUNTER — Ambulatory Visit (HOSPITAL_BASED_OUTPATIENT_CLINIC_OR_DEPARTMENT_OTHER): Payer: Medicaid Other

## 2014-02-25 VITALS — BP 120/80 | HR 62 | Temp 98.1°F | Resp 16

## 2014-02-25 VITALS — BP 120/77 | HR 74 | Temp 98.6°F | Resp 18 | Ht 65.0 in | Wt 192.4 lb

## 2014-02-25 DIAGNOSIS — C50412 Malignant neoplasm of upper-outer quadrant of left female breast: Secondary | ICD-10-CM

## 2014-02-25 DIAGNOSIS — C50419 Malignant neoplasm of upper-outer quadrant of unspecified female breast: Secondary | ICD-10-CM

## 2014-02-25 DIAGNOSIS — Z5111 Encounter for antineoplastic chemotherapy: Secondary | ICD-10-CM

## 2014-02-25 DIAGNOSIS — Z5112 Encounter for antineoplastic immunotherapy: Secondary | ICD-10-CM

## 2014-02-25 DIAGNOSIS — D509 Iron deficiency anemia, unspecified: Secondary | ICD-10-CM

## 2014-02-25 DIAGNOSIS — Z1501 Genetic susceptibility to malignant neoplasm of breast: Secondary | ICD-10-CM

## 2014-02-25 DIAGNOSIS — Z170 Estrogen receptor positive status [ER+]: Secondary | ICD-10-CM

## 2014-02-25 DIAGNOSIS — Z1509 Genetic susceptibility to other malignant neoplasm: Principal | ICD-10-CM

## 2014-02-25 HISTORY — DX: Iron deficiency anemia, unspecified: D50.9

## 2014-02-25 LAB — COMPREHENSIVE METABOLIC PANEL (CC13)
ALK PHOS: 59 U/L (ref 40–150)
ALT: 11 U/L (ref 0–55)
AST: 14 U/L (ref 5–34)
Albumin: 3.6 g/dL (ref 3.5–5.0)
Anion Gap: 10 mEq/L (ref 3–11)
BUN: 13.1 mg/dL (ref 7.0–26.0)
CO2: 21 mEq/L — ABNORMAL LOW (ref 22–29)
Calcium: 9.3 mg/dL (ref 8.4–10.4)
Chloride: 111 mEq/L — ABNORMAL HIGH (ref 98–109)
Creatinine: 0.8 mg/dL (ref 0.6–1.1)
GLUCOSE: 98 mg/dL (ref 70–140)
Potassium: 3.5 mEq/L (ref 3.5–5.1)
Sodium: 142 mEq/L (ref 136–145)
Total Protein: 7.1 g/dL (ref 6.4–8.3)

## 2014-02-25 LAB — CBC WITH DIFFERENTIAL/PLATELET
BASO%: 0.3 % (ref 0.0–2.0)
BASOS ABS: 0 10*3/uL (ref 0.0–0.1)
EOS%: 0.1 % (ref 0.0–7.0)
Eosinophils Absolute: 0 10*3/uL (ref 0.0–0.5)
HCT: 25.6 % — ABNORMAL LOW (ref 34.8–46.6)
HGB: 7.3 g/dL — ABNORMAL LOW (ref 11.6–15.9)
LYMPH%: 24.8 % (ref 14.0–49.7)
MCH: 18 pg — AB (ref 25.1–34.0)
MCHC: 28.5 g/dL — ABNORMAL LOW (ref 31.5–36.0)
MCV: 63.1 fL — AB (ref 79.5–101.0)
MONO#: 1.2 10*3/uL — ABNORMAL HIGH (ref 0.1–0.9)
MONO%: 10.3 % (ref 0.0–14.0)
NEUT#: 7.4 10*3/uL — ABNORMAL HIGH (ref 1.5–6.5)
NEUT%: 64.5 % (ref 38.4–76.8)
NRBC: 0 % (ref 0–0)
Platelets: 515 10*3/uL — ABNORMAL HIGH (ref 145–400)
RBC: 4.06 10*6/uL (ref 3.70–5.45)
RDW: 19.5 % — AB (ref 11.2–14.5)
WBC: 11.4 10*3/uL — ABNORMAL HIGH (ref 3.9–10.3)
lymph#: 2.8 10*3/uL (ref 0.9–3.3)

## 2014-02-25 MED ORDER — TRASTUZUMAB CHEMO INJECTION 440 MG
8.0000 mg/kg | Freq: Once | INTRAVENOUS | Status: AC
  Administered 2014-02-25: 714 mg via INTRAVENOUS
  Filled 2014-02-25: qty 34

## 2014-02-25 MED ORDER — SODIUM CHLORIDE 0.9 % IV SOLN
Freq: Once | INTRAVENOUS | Status: AC
  Administered 2014-02-25: 12:00:00 via INTRAVENOUS

## 2014-02-25 MED ORDER — DIPHENHYDRAMINE HCL 25 MG PO CAPS
50.0000 mg | ORAL_CAPSULE | Freq: Once | ORAL | Status: AC
  Administered 2014-02-25: 50 mg via ORAL

## 2014-02-25 MED ORDER — SODIUM CHLORIDE 0.9 % IV SOLN
840.0000 mg | Freq: Once | INTRAVENOUS | Status: AC
  Administered 2014-02-25: 840 mg via INTRAVENOUS
  Filled 2014-02-25: qty 28

## 2014-02-25 MED ORDER — DEXAMETHASONE SODIUM PHOSPHATE 20 MG/5ML IJ SOLN
20.0000 mg | Freq: Once | INTRAMUSCULAR | Status: AC
  Administered 2014-02-25: 20 mg via INTRAVENOUS

## 2014-02-25 MED ORDER — SODIUM CHLORIDE 0.9 % IV SOLN
75.0000 mg/m2 | Freq: Once | INTRAVENOUS | Status: AC
  Administered 2014-02-25: 150 mg via INTRAVENOUS
  Filled 2014-02-25: qty 15

## 2014-02-25 MED ORDER — ACETAMINOPHEN 325 MG PO TABS
ORAL_TABLET | ORAL | Status: AC
  Filled 2014-02-25: qty 2

## 2014-02-25 MED ORDER — ONDANSETRON 16 MG/50ML IVPB (CHCC)
16.0000 mg | Freq: Once | INTRAVENOUS | Status: AC
  Administered 2014-02-25: 16 mg via INTRAVENOUS

## 2014-02-25 MED ORDER — ONDANSETRON 16 MG/50ML IVPB (CHCC)
INTRAVENOUS | Status: AC
  Filled 2014-02-25: qty 16

## 2014-02-25 MED ORDER — ACETAMINOPHEN 325 MG PO TABS
650.0000 mg | ORAL_TABLET | Freq: Once | ORAL | Status: AC
  Administered 2014-02-25: 650 mg via ORAL

## 2014-02-25 MED ORDER — HEPARIN SOD (PORK) LOCK FLUSH 100 UNIT/ML IV SOLN
500.0000 [IU] | Freq: Once | INTRAVENOUS | Status: AC | PRN
  Administered 2014-02-25: 500 [IU]
  Filled 2014-02-25: qty 5

## 2014-02-25 MED ORDER — DIPHENHYDRAMINE HCL 25 MG PO CAPS
ORAL_CAPSULE | ORAL | Status: AC
  Filled 2014-02-25: qty 2

## 2014-02-25 MED ORDER — CARBOPLATIN CHEMO INJECTION 600 MG/60ML
843.6000 mg | Freq: Once | INTRAVENOUS | Status: AC
  Administered 2014-02-25: 840 mg via INTRAVENOUS
  Filled 2014-02-25: qty 84

## 2014-02-25 MED ORDER — DEXAMETHASONE SODIUM PHOSPHATE 20 MG/5ML IJ SOLN
INTRAMUSCULAR | Status: AC
  Filled 2014-02-25: qty 5

## 2014-02-25 MED ORDER — SODIUM CHLORIDE 0.9 % IJ SOLN
10.0000 mL | INTRAMUSCULAR | Status: DC | PRN
  Administered 2014-02-25: 10 mL
  Filled 2014-02-25: qty 10

## 2014-02-25 NOTE — Patient Instructions (Signed)
[facility] Discharge Instructions for Patients Receiving Chemotherapy  Today you received the following chemotherapy agents herceptin/perjeta/taxotere/carboplatin  To help prevent nausea and vomiting after your treatment, we encourage you to take your nausea medication as directed.     If you develop nausea and vomiting that is not controlled by your nausea medication, call the clinic.   BELOW ARE SYMPTOMS THAT SHOULD BE REPORTED IMMEDIATELY:  *FEVER GREATER THAN 100.5 F  *CHILLS WITH OR WITHOUT FEVER  NAUSEA AND VOMITING THAT IS NOT CONTROLLED WITH YOUR NAUSEA MEDICATION  *UNUSUAL SHORTNESS OF BREATH  *UNUSUAL BRUISING OR BLEEDING  TENDERNESS IN MOUTH AND THROAT WITH OR WITHOUT PRESENCE OF ULCERS  *URINARY PROBLEMS  *BOWEL PROBLEMS  UNUSUAL RASH Items with * indicate a potential emergency and should be followed up as soon as possible.  Feel free to call the clinic you have any questions or concerns. The clinic phone number is [phone].  Trastuzumab injection for infusion What is this medicine? TRASTUZUMAB (tras TOO zoo mab) is a monoclonal antibody. It targets a protein called HER2. This protein is found in some stomach and breast cancers. This medicine can stop cancer cell growth. This medicine may be used with other cancer treatments. This medicine may be used for other purposes; ask your health care provider or pharmacist if you have questions. COMMON BRAND NAME(S): Herceptin What should I tell my health care provider before I take this medicine? They need to know if you have any of these conditions: -heart disease -heart failure -infection (especially a virus infection such as chickenpox, cold sores, or herpes) -lung or breathing disease, like asthma -recent or ongoing radiation therapy -an unusual or allergic reaction to trastuzumab, benzyl alcohol, or other medications, foods, dyes, or preservatives -pregnant or trying to get  pregnant -breast-feeding How should I use this medicine? This drug is given as an infusion into a vein. It is administered in a hospital or clinic by a specially trained health care professional. Talk to your pediatrician regarding the use of this medicine in children. This medicine is not approved for use in children. Overdosage: If you think you have taken too much of this medicine contact a poison control center or emergency room at once. NOTE: This medicine is only for you. Do not share this medicine with others. What if I miss a dose? It is important not to miss a dose. Call your doctor or health care professional if you are unable to keep an appointment. What may interact with this medicine? -cyclophosphamide -doxorubicin -warfarin This list may not describe all possible interactions. Give your health care provider a list of all the medicines, herbs, non-prescription drugs, or dietary supplements you use. Also tell them if you smoke, drink alcohol, or use illegal drugs. Some items may interact with your medicine. What should I watch for while using this medicine? Visit your doctor for checks on your progress. Report any side effects. Continue your course of treatment even though you feel ill unless your doctor tells you to stop. Call your doctor or health care professional for advice if you get a fever, chills or sore throat, or other symptoms of a cold or flu. Do not treat yourself. Try to avoid being around people who are sick. You may experience fever, chills and shaking during your first infusion. These effects are usually mild and can be treated with other medicines. Report any side effects during the infusion to your health care professional. Fever and chills usually do not happen  with later infusions. What side effects may I notice from receiving this medicine? Side effects that you should report to your doctor or other health care professional as soon as possible: -breathing  difficulties -chest pain or palpitations -cough -dizziness or fainting -fever or chills, sore throat -skin rash, itching or hives -swelling of the legs or ankles -unusually weak or tired Side effects that usually do not require medical attention (report to your doctor or other health care professional if they continue or are bothersome): -loss of appetite -headache -muscle aches -nausea This list may not describe all possible side effects. Call your doctor for medical advice about side effects. You may report side effects to FDA at 1-800-FDA-1088. Where should I keep my medicine? This drug is given in a hospital or clinic and will not be stored at home. NOTE: This sheet is a summary. It may not cover all possible information. If you have questions about this medicine, talk to your doctor, pharmacist, or health care provider.  2014, [employer] Standard. (2009-09-23 13:43:15)  Pertuzumab injection What is this medicine? PERTUZUMAB (per TOOZ ue mab) is a monoclonal antibody that targets a protein called HER2. HER2 is found in some breast cancers. This medicine can stop cancer cell growth. This medicine is used with other cancer treatments. This medicine may be used for other purposes; ask your health care provider or pharmacist if you have questions. COMMON BRAND NAME(S): PERJETA What should I tell my health care provider before I take this medicine? They need to know if you have any of these conditions: -heart disease -heart failure -high blood pressure -history of irregular heart beat -recent or ongoing radiation therapy -an unusual or allergic reaction to pertuzumab, other medicines, foods, dyes, or preservatives -pregnant or trying to get pregnant -breast-feeding How should I use this medicine? This medicine is for infusion into a vein. It is given by a health care professional in a hospital or clinic setting. Talk to your pediatrician regarding the use of this medicine in  children. Special care may be needed. Overdosage: If you think you've taken too much of this medicine contact a poison control center or emergency room at once. Overdosage: If you think you have taken too much of this medicine contact a poison control center or emergency room at once. NOTE: This medicine is only for you. Do not share this medicine with others. What if I miss a dose? It is important not to miss your dose. Call your doctor or health care professional if you are unable to keep an appointment. What may interact with this medicine? Interactions are not expected. Give your health care provider a list of all the medicines, herbs, non-prescription drugs, or dietary supplements you use. Also tell them if you smoke, drink alcohol, or use illegal drugs. Some items may interact with your medicine. This list may not describe all possible interactions. Give your health care provider a list of all the medicines, herbs, non-prescription drugs, or dietary supplements you use. Also tell them if you smoke, drink alcohol, or use illegal drugs. Some items may interact with your medicine. What should I watch for while using this medicine? Your condition will be monitored carefully while you are receiving this medicine. Report any side effects. Continue your course of treatment even though you feel ill unless your doctor tells you to stop. Do not become pregnant while taking this medicine. Women should inform their doctor if they wish to become pregnant or think they might be pregnant. There is  a potential for serious side effects to an unborn child. Talk to your health care professional or pharmacist for more information. Do not breast-feed an infant while taking this medicine. Call your doctor or health care professional for advice if you get a fever, chills or sore throat, or other symptoms of a cold or flu. Do not treat yourself. Try to avoid being around people who are sick. You may experience fever,  chills, and headache during the infusion. Report any side effects during the infusion to your health care professional. What side effects may I notice from receiving this medicine? Side effects that you should report to your doctor or health care professional as soon as possible: -breathing problems -chest pain or palpitations -dizziness -feeling faint or lightheaded -fever or chills -skin rash, itching or hives -sore throat -swelling of the face, lips, or tongue -swelling of the legs or ankles -unusually weak or tired  Side effects that usually do not require medical attention (Report these to your doctor or health care professional if they continue or are bothersome.): -diarrhea -hair loss -nausea, vomiting -tiredness This list may not describe all possible side effects. Call your doctor for medical advice about side effects. You may report side effects to FDA at 1-800-FDA-1088. Where should I keep my medicine? This drug is given in a hospital or clinic and will not be stored at home. NOTE: This sheet is a summary. It may not cover all possible information. If you have questions about this medicine, talk to your doctor, pharmacist, or health care provider.  2014, Else[employer]/Gold Standard. (2012-09-17 16:54:15)  Docetaxel injection What is this medicine? DOCETAXEL (doe se TAX el) is a chemotherapy drug. It targets fast dividing cells, like cancer cells, and causes these cells to die. This medicine is used to treat many types of cancers like breast cancer, certain stomach cancers, head and neck cancer, lung cancer, and prostate cancer. This medicine may be used for other purposes; ask your health care provider or pharmacist if you have questions. COMMON BRAND NAME(S): Docefrez , Taxotere What should I tell my health care provider before I take this medicine? They need to know if you have any of these conditions: -infection (especially a virus infection such as chickenpox, cold sores, or  herpes) -liver disease -low blood counts, like low white cell, platelet, or red cell counts -an unusual or allergic reaction to docetaxel, polysorbate 80, other chemotherapy agents, other medicines, foods, dyes, or preservatives -pregnant or trying to get pregnant -breast-feeding How should I use this medicine? This drug is given as an infusion into a vein. It is administered in a hospital or clinic by a specially trained health care professional. Talk to your pediatrician regarding the use of this medicine in children. Special care may be needed. Overdosage: If you think you have taken too much of this medicine contact a poison control center or emergency room at once. NOTE: This medicine is only for you. Do not share this medicine with others. What if I miss a dose? It is important not to miss your dose. Call your doctor or health care professional if you are unable to keep an appointment. What may interact with this medicine? -cyclosporine -erythromycin -ketoconazole -medicines to increase blood counts like filgrastim, pegfilgrastim, sargramostim -vaccines Talk to your doctor or health care professional before taking any of these medicines: -acetaminophen -aspirin -ibuprofen -ketoprofen -naproxen This list may not describe all possible interactions. Give your health care provider a list of all the medicines, herbs, non-prescription  drugs, or dietary supplements you use. Also tell them if you smoke, drink alcohol, or use illegal drugs. Some items may interact with your medicine. What should I watch for while using this medicine? Your condition will be monitored carefully while you are receiving this medicine. You will need important blood work done while you are taking this medicine. This drug may make you feel generally unwell. This is not uncommon, as chemotherapy can affect healthy cells as well as cancer cells. Report any side effects. Continue your course of treatment even though  you feel ill unless your doctor tells you to stop. In some cases, you may be given additional medicines to help with side effects. Follow all directions for their use. Call your doctor or health care professional for advice if you get a fever, chills or sore throat, or other symptoms of a cold or flu. Do not treat yourself. This drug decreases your body's ability to fight infections. Try to avoid being around people who are sick. This medicine may increase your risk to bruise or bleed. Call your doctor or health care professional if you notice any unusual bleeding. Be careful brushing and flossing your teeth or using a toothpick because you may get an infection or bleed more easily. If you have any dental work done, tell your dentist you are receiving this medicine. Avoid taking products that contain aspirin, acetaminophen, ibuprofen, naproxen, or ketoprofen unless instructed by your doctor. These medicines may hide a fever. Do not become pregnant while taking this medicine. Women should inform their doctor if they wish to become pregnant or think they might be pregnant. There is a potential for serious side effects to an unborn child. Talk to your health care professional or pharmacist for more information. Do not breast-feed an infant while taking this medicine. What side effects may I notice from receiving this medicine? Side effects that you should report to your doctor or health care professional as soon as possible: -allergic reactions like skin rash, itching or hives, swelling of the face, lips, or tongue -low blood counts - This drug may decrease the number of white blood cells, red blood cells and platelets. You may be at increased risk for infections and bleeding. -signs of infection - fever or chills, cough, sore throat, pain or difficulty passing urine -signs of decreased platelets or bleeding - bruising, pinpoint red spots on the skin, black, tarry stools, nosebleeds -signs of decreased red  blood cells - unusually weak or tired, fainting spells, lightheadedness -breathing problems -fast or irregular heartbeat -low blood pressure -mouth sores -nausea and vomiting -pain, swelling, redness or irritation at the injection site -pain, tingling, numbness in the hands or feet -swelling of the ankle, feet, hands -weight gain Side effects that usually do not require medical attention (report to your prescriber or health care professional if they continue or are bothersome): -bone pain -complete hair loss including hair on your head, underarms, pubic hair, eyebrows, and eyelashes -diarrhea -excessive tearing -changes in the color of fingernails -loosening of the fingernails -nausea -muscle pain -red flush to skin -sweating -weak or tired This list may not describe all possible side effects. Call your doctor for medical advice about side effects. You may report side effects to FDA at 1-800-FDA-1088. Where should I keep my medicine? This drug is given in a hospital or clinic and will not be stored at home. NOTE: This sheet is a summary. It may not cover all possible information. If you have questions about this medicine,  talk to your doctor, pharmacist, or health care provider.  2014, [employer] Standard. (2008-11-01 11:52:10)  Carboplatin injection What is this medicine? CARBOPLATIN (KAR boe pla tin) is a chemotherapy drug. It targets fast dividing cells, like cancer cells, and causes these cells to die. This medicine is used to treat ovarian cancer and many other cancers. This medicine may be used for other purposes; ask your health care provider or pharmacist if you have questions. COMMON BRAND NAME(S): Paraplatin What should I tell my health care provider before I take this medicine? They need to know if you have any of these conditions: -blood disorders -hearing problems -kidney disease -recent or ongoing radiation therapy -an unusual or allergic reaction to  carboplatin, cisplatin, other chemotherapy, other medicines, foods, dyes, or preservatives -pregnant or trying to get pregnant -breast-feeding How should I use this medicine? This drug is usually given as an infusion into a vein. It is administered in a hospital or clinic by a specially trained health care professional. Talk to your pediatrician regarding the use of this medicine in children. Special care may be needed. Overdosage: If you think you have taken too much of this medicine contact a poison control center or emergency room at once. NOTE: This medicine is only for you. Do not share this medicine with others. What if I miss a dose? It is important not to miss a dose. Call your doctor or health care professional if you are unable to keep an appointment. What may interact with this medicine? -medicines for seizures -medicines to increase blood counts like filgrastim, pegfilgrastim, sargramostim -some antibiotics like amikacin, gentamicin, neomycin, streptomycin, tobramycin -vaccines Talk to your doctor or health care professional before taking any of these medicines: -acetaminophen -aspirin -ibuprofen -ketoprofen -naproxen This list may not describe all possible interactions. Give your health care provider a list of all the medicines, herbs, non-prescription drugs, or dietary supplements you use. Also tell them if you smoke, drink alcohol, or use illegal drugs. Some items may interact with your medicine. What should I watch for while using this medicine? Your condition will be monitored carefully while you are receiving this medicine. You will need important blood work done while you are taking this medicine. This drug may make you feel generally unwell. This is not uncommon, as chemotherapy can affect healthy cells as well as cancer cells. Report any side effects. Continue your course of treatment even though you feel ill unless your doctor tells you to stop. In some cases, you may  be given additional medicines to help with side effects. Follow all directions for their use. Call your doctor or health care professional for advice if you get a fever, chills or sore throat, or other symptoms of a cold or flu. Do not treat yourself. This drug decreases your body's ability to fight infections. Try to avoid being around people who are sick. This medicine may increase your risk to bruise or bleed. Call your doctor or health care professional if you notice any unusual bleeding. Be careful brushing and flossing your teeth or using a toothpick because you may get an infection or bleed more easily. If you have any dental work done, tell your dentist you are receiving this medicine. Avoid taking products that contain aspirin, acetaminophen, ibuprofen, naproxen, or ketoprofen unless instructed by your doctor. These medicines may hide a fever. Do not become pregnant while taking this medicine. Women should inform their doctor if they wish to become pregnant or think they might be pregnant. There is  a potential for serious side effects to an unborn child. Talk to your health care professional or pharmacist for more information. Do not breast-feed an infant while taking this medicine. What side effects may I notice from receiving this medicine? Side effects that you should report to your doctor or health care professional as soon as possible: -allergic reactions like skin rash, itching or hives, swelling of the face, lips, or tongue -signs of infection - fever or chills, cough, sore throat, pain or difficulty passing urine -signs of decreased platelets or bleeding - bruising, pinpoint red spots on the skin, black, tarry stools, nosebleeds -signs of decreased red blood cells - unusually weak or tired, fainting spells, lightheadedness -breathing problems -changes in hearing -changes in vision -chest pain -high blood pressure -low blood counts - This drug may decrease the number of white blood  cells, red blood cells and platelets. You may be at increased risk for infections and bleeding. -nausea and vomiting -pain, swelling, redness or irritation at the injection site -pain, tingling, numbness in the hands or feet -problems with balance, talking, walking -trouble passing urine or change in the amount of urine Side effects that usually do not require medical attention (report to your doctor or health care professional if they continue or are bothersome): -hair loss -loss of appetite -metallic taste in the mouth or changes in taste This list may not describe all possible side effects. Call your doctor for medical advice about side effects. You may report side effects to FDA at 1-800-FDA-1088. Where should I keep my medicine? This drug is given in a hospital or clinic and will not be stored at home. NOTE: This sheet is a summary. It may not cover all possible information. If you have questions about this medicine, talk to your doctor, pharmacist, or health care provider.  2014, [employer] Standard. (2008-02-24 14:38:05)

## 2014-02-25 NOTE — Progress Notes (Signed)
[facility] OFFICE PROGRESS NOTE  Patient Care Team: Provider Default, MD as PCP - General Dr. Gery Pray  Dr. Erroll Luna  DIAGNOSIS: 43 year old female with diagnosis of left breast cancer.   STAGE:  Breast cancer of upper-outer quadrant of left female breast  Primary site: Breast (Left)  Staging method: AJCC 7th Edition  Clinical: Stage IIB (T2, N1, cM0)  Summary: Stage IIB (T2, N1, cM0)   SUMMARY OF ONCOLOGIC HISTORY: #1 patient palpated a left breast mass in the upper outer quadrant. Mammogram showed a 3.2 cm mass in the 2:00 position 7 cm from the nipple. There was also noted to be an 8 mm mass 8 cm from the nipple in the ipsilateral breast. She was also noted to have positive lymph nodes. She had a biopsy of both masses performed as well as the lymph node. 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 MRI confirmed 2.8 and 1.3 cm mass is as well as lymph node. On the right he was noted to have a 6 mm nodule. This has been biopsied and is negative.  #2 staging scans: PET scan  1. Hypermetabolic left breast mass consistent with known neoplasm.  There are also FDG positive left axillary lymph nodes.  2. Benign-appearing brown fat activity and muscular activity in the  neck and chest but no findings for metastatic disease involving the  neck, chest, abdomen or pelvis.  3. No findings for metastatic bone disease.  4. Moderate FDG activity in the endometrial canal is likely due to  secretory phase of ovulation or menses. No mass is seen.  5. Uterine fibroids  CT SCAN: 1. 3 cm left breast mass corresponding to the patient's known  neoplasm. Enlarged left axillary lymph nodes are positive on the PET  scan.  2. No CT findings for metastatic disease involving the chest,  abdomen or pelvis and no evidence of  osseous metastatic disease.  3. Mildly enlarged fibroid uterus.  #3 genetic testing: Patient is a BRCA1 mutation carrier  #4 echocardiogram:  02/19/2014: Left ventricular ejection fraction 45-50%. Patient was seen by cardiology and they have cleared her for chemotherapy with anti-HER-2-based regimen  #5 neoadjuvant chemotherapy: Patient recommended Taxotere carboplatinum Herceptin and perjeta every 3 weeks for a total of 6 cycles.   INTERVAL HISTORY: Tamara [name] 43 y.o. female returns for Followup visit prior to her chemotherapy. Clinically she seems to be doing well. Patient and I discussed extensively today her genetic testing results. We discussed the implications of this to her personally as well as her family. Overall she is feeling well. She is ready to get her chemotherapy started. She denies any headaches double vision blurring of vision fevers chills night sweats no shortness of breath no chest pains palpitations. Patient is anemic. She has had anemia lifelong. Today her hemoglobin was 7.3. We discussed this as well there may be a need for blood transfusion eventually. Certainly we could try to give her IV iron to see if she will have an improvement in her anemia. Remainder of the 10 point review of systems is negative and as below  I have reviewed the past medical history, past surgical history, social history and family history with the patient and they are unchanged from previous note.  ALLERGIES:  has No Known Allergies.  MEDICATIONS:  Current Outpatient Prescriptions  Medication Sig Dispense Refill  . dexamethasone (  DECADRON) 4 MG tablet Take 2 tablets (8 mg total) by mouth 2 (two) times daily. Start the day before Taxotere. Then again the day after chemo for 3 days.  30 tablet  1  . hydrochlorothiazide (HYDRODIURIL) 25 MG tablet Take 25 mg by mouth every morning.      . lidocaine-prilocaine (EMLA) cream Apply topically as needed.  30 g  6  . LORazepam (ATIVAN) 0.5 MG tablet  Take 1 tablet (0.5 mg total) by mouth every 6 (six) hours as needed (Nausea or vomiting).  30 tablet  0  . ondansetron (ZOFRAN) 8 MG tablet Take 1 tablet (8 mg total) by mouth 2 (two) times daily. Start the day after chemo for 3 days. Then take as needed for nausea or vomiting.  30 tablet  1  . oxyCODONE-acetaminophen (ROXICET) 5-325 MG per tablet Take 1 tablet by mouth every 4 (four) hours as needed.  30 tablet  0  . prochlorperazine (COMPAZINE) 10 MG tablet Take 1 tablet (10 mg total) by mouth every 6 (six) hours as needed (Nausea or vomiting).  30 tablet  1   No current facility-administered medications for this visit.    REVIEW OF SYSTEMS:   Constitutional: Denies fevers, chills or abnormal weight loss Eyes: Denies blurriness of vision Ears, nose, mouth, throat, and face: Denies mucositis or sore throat Respiratory: Denies cough, dyspnea or wheezes Cardiovascular: Denies palpitation, chest discomfort or lower extremity swelling Gastrointestinal:  Denies nausea, heartburn or change in bowel habits Skin: Denies abnormal skin rashes Lymphatics: Denies new lymphadenopathy or easy bruising Neurological:Denies numbness, tingling or new weaknesses Behavioral/Psych: Mood is stable, no new changes  All other systems were reviewed with the patient and are negative.  PHYSICAL EXAMINATION: ECOG PERFORMANCE STATUS: 0 - Asymptomatic  Filed Vitals:   02/25/14 0941  BP: 120/77  Pulse: 74  Temp: 98.6 F (37 C)  Resp: 18   Filed Weights   02/25/14 0941  Weight: 192 lb 6.4 oz (87.272 kg)    GENERAL:alert, no distress and comfortable SKIN: skin color, texture, turgor are normal, no rashes or significant lesions EYES: normal, Conjunctiva are pink and non-injected, sclera clear OROPHARYNX:no exudate, no erythema and lips, buccal mucosa, and tongue normal  NECK: supple, thyroid normal size, non-tender, without nodularity LYMPH:  no palpable lymphadenopathy in the cervical, axillary or  inguinal LUNGS: clear to auscultation and percussion with normal breathing effort HEART: regular rate & rhythm and no murmurs and no lower extremity edema ABDOMEN:abdomen soft, non-tender and normal bowel sounds Musculoskeletal:no cyanosis of digits and no clubbing  NEURO: alert & oriented x 3 with fluent speech, no focal motor/sensory deficits Breast examination: Palpable mass in the left breast upper outer quadrant measuring about 2-3 cm, right breast no masses or nipple discharge   LABORATORY DATA:  I have reviewed the data as listed    Component Value Date/Time   NA 139 02/18/2014 0910   NA 137 02/10/2014 0812   K 4.2 02/18/2014 0910   K 4.0 02/10/2014 0812   CL 99 02/18/2014 0910   CO2 26 02/18/2014 0910   CO2 22 02/10/2014 0812   GLUCOSE 78 02/18/2014 0910   GLUCOSE 90 02/10/2014 0812   BUN 13 02/18/2014 0910   BUN 20.5 02/10/2014 0812   CREATININE 0.87 02/18/2014 0910   CREATININE 0.9 02/10/2014 0812   CALCIUM 9.4 02/18/2014 0910   CALCIUM 9.5 02/10/2014 0812   PROT 7.9 02/10/2014 0812   ALBUMIN 4.0 02/10/2014 0812   AST 17 02/10/2014 7893  ALT 11 02/10/2014 0812   ALKPHOS 65 02/10/2014 0812   BILITOT 0.23 02/10/2014 0812   GFRNONAA 82* 02/18/2014 0910   GFRAA >90 02/18/2014 0910    No results found for this basename: SPEP, UPEP,  kappa and lambda light chains    Lab Results  Component Value Date   WBC 11.4* 02/25/2014   NEUTROABS 7.4* 02/25/2014   HGB 7.3* 02/25/2014   HCT 25.6* 02/25/2014   MCV 63.1* 02/25/2014   PLT 515* 02/25/2014      Chemistry      Component Value Date/Time   NA 139 02/18/2014 0910   NA 137 02/10/2014 0812   K 4.2 02/18/2014 0910   K 4.0 02/10/2014 0812   CL 99 02/18/2014 0910   CO2 26 02/18/2014 0910   CO2 22 02/10/2014 0812   BUN 13 02/18/2014 0910   BUN 20.5 02/10/2014 0812   CREATININE 0.87 02/18/2014 0910   CREATININE 0.9 02/10/2014 0812      Component Value Date/Time   CALCIUM 9.4 02/18/2014 0910   CALCIUM 9.5 02/10/2014 0812   ALKPHOS 65 02/10/2014 0812    AST 17 02/10/2014 0812   ALT 11 02/10/2014 0812   BILITOT 0.23 02/10/2014 0812       RADIOGRAPHIC STUDIES: I have personally reviewed the radiological images as listed and agreed with the findings in the report. Dg Chest Port 1 View  02/23/2014   CLINICAL DATA:  Power poor placement.  EXAM: PORTABLE CHEST - 1 VIEW  COMPARISON:  CT CHEST W/CM dated 02/19/2014; DG CHEST 2 VIEW dated 02/18/2014  FINDINGS: A PowerPort noted with tip projected over the cavoatrial junction. Mediastinum and hilar structures are normal. Mild cardiomegaly and pulmonary vascular prominence noted. No focal infiltrate. No pleural effusion or pneumothorax. No acute bony abnormality .  IMPRESSION: 1. Power port noted in good anatomic position. 2. Cardiomegaly with mild pulmonary vascular prominence. No pulmonary edema or focal pulmonary infiltrate.   Electronically Signed   By: Marcello Moores  Register   On: 02/23/2014 13:11   Dg Fluoro Guide Cv Line-no Report  02/23/2014   CLINICAL DATA: port insertion   FLOURO GUIDE CV LINE  Fluoroscopy was utilized by the requesting physician.  No radiographic  interpretation.       ASSESSMENT & PLAN:  43 year old female with  #1 stage II (T2 N1) invasive ductal carcinoma of the left breast found on self breast examination and mammogram. Patient has had biopsies performed the tumor was invasive ductal carcinoma grade 2 through 3, ER negative PR negative HER-2/neu positive with an elevated proliferation marker Ki-67. She has had a biopsy of the right breast performed that was negative for malignancy. Patient was recommended neoadjuvant chemotherapy consisting of TCH/.P. she has had her chemotherapy class, echocardiogram, Port-A-Cath placement, she has all of her anti-emetics. She will proceed with cycle 1 day 1 of TCH/P. She understands risks benefits side effects complications of the treatment.  #2 genetic testing: Patient is mutation carrier for BRCA1 gene, we discussed implications including  possibility of having bilateral mastectomies and bilateral salpingo-oophorectomies. She would like to be seen by a plastic surgeon and I have referred her to Dr. Theodoro Kos. Eventually we will refer her to a gynecologist.  #3 anemia: Patient has a lifelong history of being anemic, iron deficient. She has not been taking her oral iron of late. I have recommended giving her infusional iron tomorrow when she comes in for her Neulasta injection. Certainly if she becomes symptomatic from her anemia she may  need blood transfusions. We will decide this on an as-needed basis.  #4 followup: Patient will be seen back in one week for chemotherapy toxicity check, provider visit, and blood work. She is instructed to call us with any problems questions or concerns.   No orders of the defined types were placed in this encounter.   All questions were answered. The patient knows to call the clinic with any problems, questions or concerns. No barriers to learning was detected. I spent 30 minutes counseling the patient face to face. The total time spent in the appointment was 40 minutes and more than 50% was on counseling and review of test results and coordination of care.     Marcy Panning, MD 02/25/2014 10:29 AM

## 2014-02-25 NOTE — Telephone Encounter (Signed)
Per note below, let patient know while she was in the office for follow up visit, that her PET scan showed no evidence of distant cancer.  Pt voiced understanding.

## 2014-02-25 NOTE — Telephone Encounter (Signed)
Per staff phone call and POF I have schedueld appts.  JMW  

## 2014-02-25 NOTE — Progress Notes (Signed)
Ok to treat (Hgb 7.3) per Dr. Humphrey Rolls.  Pt to receive feraheme tomorrow.

## 2014-02-25 NOTE — Telephone Encounter (Signed)
, °

## 2014-02-25 NOTE — Patient Instructions (Signed)
Proceed with chemotherapy today.  IV iron for severe anemia.  We will refer you to Dr. Theodoro Kos to discuss reconstructive surgery.  We discussed your genetic test. You do carry the BRCA1 gene mutation. We discussed implications of this to your family as well as yourself.

## 2014-02-25 NOTE — Telephone Encounter (Signed)
Message copied by Prentiss Bells on Thu Feb 25, 2014  9:47 AM ------      Message from: Deatra Robinson      Created: Mon Feb 22, 2014  8:08 AM       Scans: no evidence of distant cancer ------

## 2014-02-26 ENCOUNTER — Telehealth: Payer: Self-pay | Admitting: *Deleted

## 2014-02-26 ENCOUNTER — Ambulatory Visit (HOSPITAL_BASED_OUTPATIENT_CLINIC_OR_DEPARTMENT_OTHER): Payer: Medicaid Other

## 2014-02-26 ENCOUNTER — Ambulatory Visit: Payer: Self-pay

## 2014-02-26 VITALS — BP 131/73 | HR 57 | Temp 97.2°F | Resp 16

## 2014-02-26 DIAGNOSIS — Z5189 Encounter for other specified aftercare: Secondary | ICD-10-CM

## 2014-02-26 DIAGNOSIS — C50419 Malignant neoplasm of upper-outer quadrant of unspecified female breast: Secondary | ICD-10-CM

## 2014-02-26 DIAGNOSIS — C50412 Malignant neoplasm of upper-outer quadrant of left female breast: Secondary | ICD-10-CM

## 2014-02-26 DIAGNOSIS — D509 Iron deficiency anemia, unspecified: Secondary | ICD-10-CM

## 2014-02-26 MED ORDER — PEGFILGRASTIM INJECTION 6 MG/0.6ML
6.0000 mg | Freq: Once | SUBCUTANEOUS | Status: AC
  Administered 2014-02-26: 6 mg via SUBCUTANEOUS
  Filled 2014-02-26: qty 0.6

## 2014-02-26 MED ORDER — SODIUM CHLORIDE 0.9 % IV SOLN
1020.0000 mg | Freq: Once | INTRAVENOUS | Status: AC
  Administered 2014-02-26: 1020 mg via INTRAVENOUS
  Filled 2014-02-26: qty 34

## 2014-02-26 MED ORDER — SODIUM CHLORIDE 0.9 % IJ SOLN
10.0000 mL | INTRAMUSCULAR | Status: DC | PRN
  Administered 2014-02-26: 10 mL
  Filled 2014-02-26: qty 10

## 2014-02-26 MED ORDER — SODIUM CHLORIDE 0.9 % IV SOLN
Freq: Once | INTRAVENOUS | Status: AC
  Administered 2014-02-26: 09:00:00 via INTRAVENOUS

## 2014-02-26 MED ORDER — HEPARIN SOD (PORK) LOCK FLUSH 100 UNIT/ML IV SOLN
500.0000 [IU] | Freq: Once | INTRAVENOUS | Status: AC | PRN
  Administered 2014-02-26: 500 [IU]
  Filled 2014-02-26: qty 5

## 2014-02-26 NOTE — Telephone Encounter (Signed)
Patient in chemo area today for IV Iron. She states she feels ok, however, she has had 2 loose stools since yesterday. No nausea, vomiting or fever. Drinking and eating well, encouraged to take Immodium, 2 tabs with each loose stool, maximum of 8 tabs. If this does not solve her diarrhea, she needs to call us back, we may need to give her something stronger and set her up for IVF. Patient verbalized understanding.

## 2014-02-26 NOTE — Telephone Encounter (Signed)
Message copied by Verlon Setting on Fri Feb 26, 2014  4:19 PM ------      Message from: Arty Baumgartner      Created: Thu Feb 25, 2014  4:40 PM      Regarding: First time chemo follow up       First time herceptin/perjeta/taxotere/carboplatin.  [phone]  Dr Humphrey Rolls ------

## 2014-02-26 NOTE — Progress Notes (Signed)
Pt had first dose TCH/perjeta yesterday 02/25/14. At home last evening she reports 2 episodes of diarrhea and nausea. She was still able to eat dinner last night and breakfast today without emesis. Pt took anti-emetic at home this morning and nausea has resolved. She has not had diarrhea yet today but feels like she could at any time. Pt also states when she previously took oral iron she would experience diarrhea. Per discussion with Dr. Laurelyn Sickle desk RN, pt instructed to begin imodium if diarrhea occurs today, 2 per each loose stool and not to exceed 8 in 24 hours. Pt encouraged to increase PO intake and to call [facility] over the weekend if she cannot keep fluids in. Pt verbalizes understanding.   Pt tolerated first dose feraheme and 30 minute monitor without issues or complaints. VSS, Neulasta given per orders, and patient discharged in stable condition.

## 2014-02-26 NOTE — Op Note (Signed)
Tamara [name], Tamara [name]                ACCOUNT NO.:  [id number]  MEDICAL RECORD NO.:  [id number]  LOCATION:                                 FACILITY:  PHYSICIAN:  Maurine Mowbray A. Ibeth Fahmy, M.D.DATE OF BIRTH:  1971/02/01  DATE OF PROCEDURE:  02/23/2014 DATE OF DISCHARGE:  02/23/2014                              OPERATIVE REPORT   PREOPERATIVE DIAGNOSES:  Poor venous access and stage IIB breast cancer in need of chemotherapy.  POSTOPERATIVE DIAGNOSES:  Poor venous access and stage IIB breast cancer in need of chemotherapy.  PROCEDURE: 1. Attempted placement of right subclavian Port-A-Cath 2. Placement of right internal jugular 8-French clear view Port-A-Cath     with fluoroscopic guidance and ultrasound-guidance.  SURGEON:  Marcello Moores A. Milliana Reddoch, M.D.  ANESTHESIA:  LMA with 0.25% Sensorcaine, local.  ESTIMATED BLOOD LOSS:  50 mL.  IV FLUIDS:  Approximately 600 mL of saline.  DRAINS:  None.  INDICATIONS FOR PROCEDURE:  The patient is a pleasant 43 year old female with breast cancer.  She requires neoadjuvant chemotherapy and presents for vascular access for that.  Risk, benefits, and alternatives of the Port-A-Cath were discussed.  Risk of bleeding, infection, pneumothorax, hemothorax, injury to the neck structures, injury to mediastinal structures, cardiac perforation, cardiac tamponade, and other operations it may be necessary to correct these were discussed.  Alternatives to Port-A-Cath were discussed.  She wished to proceed.  DESCRIPTION OF PROCEDURE:  The patient was met in the holding area. Questions were answered.  She was taken back to the operating room where she was placed supine on the operating room table.  Her upper chest and neck regions were prepped and draped in sterile fashion after LMA anesthesia was initiated.  Time-out was done.  She was placed in Trendelenburg.  The right subclavian vein was easily accessed by a needle and the return of dark nonpulsatile blood was  noted.  Wire was led through this with fluoroscopic guidance, and tracked down the superior vena cava into the right ventricle.  The incision was made below that, and a pocket was created bluntly with my finger.  A stab incision was made at the wire exit site.  We then placed the patient back in Trendelenburg at this point.  The clear view 8-French port was brought on the field.  It was attached and flushed.  It was tunneled from the lower incision to the wire exit site.  It was trimmed to approximately 15 cm from the skin.  She has placed back in Trendelenburg with fluoroscopic guidance.  The dilator introducer complex was advanced.  The portion, we could not get it to make the turn at the junction of the right subclavian vein to the superior vena cava.  It was going across the innominate.  After multiple attempts, I felt that a better approach would be for right internal jugular with a straight shot down.  I did try to pull the introducer back and PD catheter under fluoroscopic guidance down it.  After multiple attempts, it tracked across innominate vein.  I then placed a wire through the catheter and tried to pull the catheter back and advanced a wire down the superior vena cava and  could not be set at this point in time.  At this point, we abandoned the right subclavian approach and decided the ultrasound to locate the right internal jugular vein.  Using ultrasound guidance the right internal jugular vein was easily identified.  A needle was placed in it and dark nonpulsatile blood drew back.  Wire was fed through easily.  Of note, we used the new Port-A- Cath at this point of case.  We then brought the port onto the field and attached to it and tunneled if from the lower incision to the neck incision we made.  With the patient in Trendelenburg, I was unable to advance the dilator introducer complex easily.  We pulled out the wire and the introducer and the dilator and the introducer  in place in the neck.  We then put the tip of the catheter through this and peeled away the sheath without difficulty.  Under fluoroscopic guidance, the tip was in the junction of the SVC and right atrium.  I drew back dark nonpulsatile blood.  I flushed easily.  We secured the port to the chest wall with 2-0 Prolene.  We then closed all 3 incisions with 3-0 Vicryl, and 4-0 Monocryl.  Dermabond was applied to each.  We then put 5 mL of 100 units/mL of heparinized flush into the port  at this point in time. Dermabond used for skin incisions.  All final counts of sponge, needle, and instruments found to be correct at this portion of the case.  The patient was awoke in satisfactory condition.  All final counts were found to be correct.  EBL 50 mL.  Chest x-ray will be obtained in the recovery room.  No obvious pneumothorax or hemothorax by fluoroscopic views.     Tamara [name], M.D.   ______________________________ Tamara [name], M.D.    TAC/MEDQ  D:  02/23/2014  T:  02/23/2014  Job:  [id number]

## 2014-02-26 NOTE — Patient Instructions (Signed)
Pegfilgrastim injection What is this medicine? PEGFILGRASTIM (peg fil GRA stim) helps the body make more white blood cells. It is used to prevent infection in people with low amounts of white blood cells following cancer treatment. This medicine may be used for other purposes; ask your health care provider or pharmacist if you have questions. COMMON BRAND NAME(S): Neulasta What should I tell my health care provider before I take this medicine? They need to know if you have any of these conditions: -sickle cell disease -an unusual or allergic reaction to pegfilgrastim, filgrastim, E.coli protein, other medicines, foods, dyes, or preservatives -pregnant or trying to get pregnant -breast-feeding How should I use this medicine? This medicine is for injection under the skin. It is usually given by a health care professional in a hospital or clinic setting. If you get this medicine at home, you will be taught how to prepare and give this medicine. Do not shake this medicine. Use exactly as directed. Take your medicine at regular intervals. Do not take your medicine more often than directed. It is important that you put your used needles and syringes in a special sharps container. Do not put them in a trash can. If you do not have a sharps container, call your pharmacist or healthcare provider to get one. Talk to your pediatrician regarding the use of this medicine in children. While this drug may be prescribed for children who weigh more than 45 kg for selected conditions, precautions do apply Overdosage: If you think you have taken too much of this medicine contact a poison control center or emergency room at once. NOTE: This medicine is only for you. Do not share this medicine with others. What if I miss a dose? If you miss a dose, take it as soon as you can. If it is almost time for your next dose, take only that dose. Do not take double or extra doses. What may interact with this  medicine? -lithium -medicines for growth therapy This list may not describe all possible interactions. Give your health care provider a list of all the medicines, herbs, non-prescription drugs, or dietary supplements you use. Also tell them if you smoke, drink alcohol, or use illegal drugs. Some items may interact with your medicine. What should I watch for while using this medicine? Visit your doctor for regular check ups. You will need important blood work done while you are taking this medicine. What side effects may I notice from receiving this medicine? Side effects that you should report to your doctor or health care professional as soon as possible: -allergic reactions like skin rash, itching or hives, swelling of the face, lips, or tongue -breathing problems -fever -pain, redness, or swelling where injected -shoulder pain -stomach or side pain Side effects that usually do not require medical attention (report to your doctor or health care professional if they continue or are bothersome): -aches, pains -headache -loss of appetite -nausea, vomiting -unusually tired This list may not describe all possible side effects. Call your doctor for medical advice about side effects. You may report side effects to FDA at 1-800-FDA-1088. Where should I keep my medicine? Keep out of the reach of children. Store in a refrigerator between 2 and 8 degrees C (36 and 46 degrees F). Do not freeze. Keep in carton to protect from light. Throw away this medicine if it is left out of the refrigerator for more than 48 hours. Throw away any unused medicine after the expiration date. NOTE: This sheet is  a summary. It may not cover all possible information. If you have questions about this medicine, talk to your doctor, pharmacist, or health care provider.  2014, Elsevier/Gold Standard. (2008-06-21 15:41:44) Ferumoxytol injection What is this medicine? FERUMOXYTOL is an iron complex. Iron is used to make  healthy red blood cells, which carry oxygen and nutrients throughout the body. This medicine is used to treat iron deficiency anemia in people with chronic kidney disease. This medicine may be used for other purposes; ask your health care provider or pharmacist if you have questions. COMMON BRAND NAME(S): Feraheme  What should I tell my health care provider before I take this medicine? They need to know if you have any of these conditions: -anemia not caused by low iron levels -high levels of iron in the blood -magnetic resonance imaging (MRI) test scheduled -an unusual or allergic reaction to iron, other medicines, foods, dyes, or preservatives -pregnant or trying to get pregnant -breast-feeding How should I use this medicine? This medicine is for injection into a vein. It is given by a health care professional in a hospital or clinic setting. Talk to your pediatrician regarding the use of this medicine in children. Special care may be needed. Overdosage: If you think you've taken too much of this medicine contact a poison control center or emergency room at once. Overdosage: If you think you have taken too much of this medicine contact a poison control center or emergency room at once. NOTE: This medicine is only for you. Do not share this medicine with others. What if I miss a dose? It is important not to miss your dose. Call your doctor or health care professional if you are unable to keep an appointment. What may interact with this medicine? This medicine may interact with the following medications: -other iron products This list may not describe all possible interactions. Give your health care provider a list of all the medicines, herbs, non-prescription drugs, or dietary supplements you use. Also tell them if you smoke, drink alcohol, or use illegal drugs. Some items may interact with your medicine. What should I watch for while using this medicine? Visit your doctor or healthcare  professional regularly. Tell your doctor or healthcare professional if your symptoms do not start to get better or if they get worse. You may need blood work done while you are taking this medicine. You may need to follow a special diet. Talk to your doctor. Foods that contain iron include: whole grains/cereals, dried fruits, beans, or peas, leafy green vegetables, and organ meats (liver, kidney). What side effects may I notice from receiving this medicine? Side effects that you should report to your doctor or health care professional as soon as possible: -allergic reactions like skin rash, itching or hives, swelling of the face, lips, or tongue -breathing problems -changes in blood pressure -feeling faint or lightheaded, falls -fever or chills -flushing, sweating, or hot feelings -swelling of the ankles or feet Side effects that usually do not require medical attention (Report these to your doctor or health care professional if they continue or are bothersome.): -diarrhea -headache -nausea, vomiting -stomach pain This list may not describe all possible side effects. Call your doctor for medical advice about side effects. You may report side effects to FDA at 1-800-FDA-1088. Where should I keep my medicine? This drug is given in a hospital or clinic and will not be stored at home. NOTE: This sheet is a summary. It may not cover all possible information. If you have  questions about this medicine, talk to your doctor, pharmacist, or health care provider.  2014, Elsevier/Gold Standard. (2012-07-04 15:23:36)

## 2014-03-01 ENCOUNTER — Telehealth: Payer: Self-pay | Admitting: Genetic Counselor

## 2014-03-01 ENCOUNTER — Encounter: Payer: Self-pay | Admitting: *Deleted

## 2014-03-01 ENCOUNTER — Telehealth: Payer: Self-pay | Admitting: *Deleted

## 2014-03-01 NOTE — Telephone Encounter (Signed)
The plan you have outlined is good.  Please follow up with the patient in the next 24 hours to see how she is doing.

## 2014-03-01 NOTE — Telephone Encounter (Signed)
   Provider input needed: Nausea, constipation   Reason for call: "I am still nauseated despite taking the medicines and vomited twice"   Gastrointestinal: positive for constipation, nausea and vomiting   ALLERGIES:  has No Known Allergies.  Patient last received chemotherapy/ treatment on 02-25-2014 1st carboplatin, herceptin, perjetta, taxotere  Patient was last seen in the office on 02/25/2014  Next appt is 03/04/2014  Is patient having fevers greater than 100.5?  no, Reports temperature = 100.1 on Friday and Saturday.  Not able to check at this time due to being at dental appointment with her daughter.   Is patient having uncontrolled pain, or new pain? yes, "Pain to back feels like I've been stabbed five times since injection on Friday.  script for oxycodone has not been picked up yet."    Is patient having new back pain that changes with position (worsens or eases when laying down?)  no   Is patient able to eat and drink? yes, "small portions twice a day and I drink two, 16.9 oz bottled waters.    Is patient able to pass stool without difficulty?   yes, "two diarrhea stools on Thursday's treatment day and no bowel movements since."     Is patient having uncontrolled nausea?  yes, "I just can't sem to shake the nausea and the lorazepam makes me feel more nauseated."    patient calls 03/01/2014 with complaint of  Gastrointestinal: positive for constipation, nausea, vomiting and has taken zofran for three days as ordered.  has not tried compazine and  the lorazepam makes her more nauseated.  denies use of laxitives.  Ate cereal and banana today and emesis looked like what she ate for breakfast.   Summary Based on the above information advised patient to  Try compazine thirty minutes before meals and it is okay to try all three medicines as ordered to relieve nausea if needed.  Increase water intake, try a laxative as constipation can make you nauseated, this nurse will notify providers  and contact her at [phone] with any orders or further instructions.   Winston-Spruiell, Jerol Rufener  03/01/2014, 10:21 AM   Background Info  Tamara [name]   DOB: [birth year]   MR#: [id number]   CSN#   [id number] 03/01/2014

## 2014-03-01 NOTE — Telephone Encounter (Signed)
Revealed that a BRCA1 mutation was found.  She will come in for genetic counseling on 4/1 at 1 PM.

## 2014-03-01 NOTE — Telephone Encounter (Signed)
Called patient who reports she has taken the Compazine and "I feel way better than I did this morning.  I still feel nausea but not as bad."  Will call tomorrow to check on her.

## 2014-03-02 NOTE — Telephone Encounter (Signed)
Called patient who reports her "nausea is greatly improved.  I also have had three bowels movements which really relieved me".  Thanked me for calling and she is doing well.  Denies any further questions, symptoms or complaints.

## 2014-03-03 ENCOUNTER — Encounter: Payer: Medicaid Other | Admitting: Genetic Counselor

## 2014-03-04 ENCOUNTER — Ambulatory Visit (HOSPITAL_BASED_OUTPATIENT_CLINIC_OR_DEPARTMENT_OTHER): Payer: Medicaid Other | Admitting: Adult Health

## 2014-03-04 ENCOUNTER — Ambulatory Visit: Payer: Medicaid Other

## 2014-03-04 ENCOUNTER — Telehealth: Payer: Self-pay | Admitting: Adult Health

## 2014-03-04 ENCOUNTER — Other Ambulatory Visit (HOSPITAL_BASED_OUTPATIENT_CLINIC_OR_DEPARTMENT_OTHER): Payer: Medicaid Other

## 2014-03-04 ENCOUNTER — Ambulatory Visit (HOSPITAL_COMMUNITY)
Admission: RE | Admit: 2014-03-04 | Discharge: 2014-03-04 | Disposition: A | Discharge status: Home / Self Care | Payer: Medicaid Other | Source: Ambulatory Visit | Attending: Oncology | Admitting: Oncology

## 2014-03-04 ENCOUNTER — Encounter: Payer: Self-pay | Admitting: Adult Health

## 2014-03-04 VITALS — BP 126/80 | HR 92 | Temp 98.3°F | Resp 18 | Ht 65.0 in | Wt 195.6 lb

## 2014-03-04 DIAGNOSIS — R110 Nausea: Secondary | ICD-10-CM

## 2014-03-04 DIAGNOSIS — R112 Nausea with vomiting, unspecified: Secondary | ICD-10-CM

## 2014-03-04 DIAGNOSIS — D509 Iron deficiency anemia, unspecified: Secondary | ICD-10-CM | POA: Insufficient documentation

## 2014-03-04 DIAGNOSIS — C50412 Malignant neoplasm of upper-outer quadrant of left female breast: Secondary | ICD-10-CM

## 2014-03-04 DIAGNOSIS — J029 Acute pharyngitis, unspecified: Secondary | ICD-10-CM

## 2014-03-04 DIAGNOSIS — D630 Anemia in neoplastic disease: Secondary | ICD-10-CM | POA: Insufficient documentation

## 2014-03-04 DIAGNOSIS — C773 Secondary and unspecified malignant neoplasm of axilla and upper limb lymph nodes: Secondary | ICD-10-CM

## 2014-03-04 DIAGNOSIS — D649 Anemia, unspecified: Secondary | ICD-10-CM

## 2014-03-04 DIAGNOSIS — R42 Dizziness and giddiness: Secondary | ICD-10-CM

## 2014-03-04 DIAGNOSIS — C50419 Malignant neoplasm of upper-outer quadrant of unspecified female breast: Secondary | ICD-10-CM

## 2014-03-04 DIAGNOSIS — Z170 Estrogen receptor positive status [ER+]: Secondary | ICD-10-CM

## 2014-03-04 DIAGNOSIS — R0989 Other specified symptoms and signs involving the circulatory and respiratory systems: Secondary | ICD-10-CM

## 2014-03-04 DIAGNOSIS — R5381 Other malaise: Secondary | ICD-10-CM

## 2014-03-04 DIAGNOSIS — E860 Dehydration: Secondary | ICD-10-CM

## 2014-03-04 DIAGNOSIS — R5383 Other fatigue: Secondary | ICD-10-CM

## 2014-03-04 DIAGNOSIS — R0609 Other forms of dyspnea: Secondary | ICD-10-CM

## 2014-03-04 LAB — CBC WITH DIFFERENTIAL/PLATELET
BASO%: 0.5 % (ref 0.0–2.0)
Basophils Absolute: 0.2 10*3/uL — ABNORMAL HIGH (ref 0.0–0.1)
EOS ABS: 0.1 10*3/uL (ref 0.0–0.5)
EOS%: 0.3 % (ref 0.0–7.0)
HEMATOCRIT: 24 % — AB (ref 34.8–46.6)
LYMPH%: 13.7 % — ABNORMAL LOW (ref 14.0–49.7)
MCH: 18 pg — ABNORMAL LOW (ref 25.1–34.0)
MCV: 61.7 fL — ABNORMAL LOW (ref 79.5–101.0)
MONO#: 1.7 10*3/uL — AB (ref 0.1–0.9)
MONO%: 5.3 % (ref 0.0–14.0)
NEUT#: 25.6 10*3/uL — ABNORMAL HIGH (ref 1.5–6.5)
NEUT%: 80.2 % — AB (ref 38.4–76.8)
PLATELETS: 438 10*3/uL — AB (ref 145–400)
RBC: 3.89 10*6/uL (ref 3.70–5.45)
RDW: 19.9 % — ABNORMAL HIGH (ref 11.2–14.5)
WBC: 31.9 10*3/uL — AB (ref 3.9–10.3)
lymph#: 4.4 10*3/uL — ABNORMAL HIGH (ref 0.9–3.3)

## 2014-03-04 LAB — COMPREHENSIVE METABOLIC PANEL (CC13)
ALT: 29 U/L (ref 0–55)
ANION GAP: 10 meq/L (ref 3–11)
AST: 25 U/L (ref 5–34)
Albumin: 3.2 g/dL — ABNORMAL LOW (ref 3.5–5.0)
Alkaline Phosphatase: 141 U/L (ref 40–150)
BUN: 12 mg/dL (ref 7.0–26.0)
CALCIUM: 9.3 mg/dL (ref 8.4–10.4)
CO2: 24 meq/L (ref 22–29)
CREATININE: 0.8 mg/dL (ref 0.6–1.1)
Chloride: 107 mEq/L (ref 98–109)
GLUCOSE: 108 mg/dL (ref 70–140)
Potassium: 4.7 mEq/L (ref 3.5–5.1)
Sodium: 141 mEq/L (ref 136–145)
Total Bilirubin: <0.2 mg/dL (ref 0.20–1.20)
Total Protein: 6.3 g/dL — ABNORMAL LOW (ref 6.4–8.3)

## 2014-03-04 LAB — ABO/RH: ABO/RH(D): O NEG

## 2014-03-04 MED ORDER — SODIUM CHLORIDE 0.9 % IV SOLN: INTRAVENOUS | Status: DC

## 2014-03-04 MED ORDER — ONDANSETRON 16 MG/50ML IVPB (CHCC): 16.0000 mg | Freq: Once | INTRAVENOUS | Status: DC

## 2014-03-04 MED ORDER — FERROUS SULFATE 325 (65 FE) MG PO TBEC: 325.0000 mg | DELAYED_RELEASE_TABLET | Freq: Three times a day (TID) | ORAL | Status: DC

## 2014-03-04 NOTE — Patient Instructions (Signed)
Blood Transfusion Information WHAT IS A BLOOD TRANSFUSION? A transfusion is the replacement of blood or some of its parts. Blood is made up of multiple cells which provide different functions.  Red blood cells carry oxygen and are used for blood loss replacement.  White blood cells fight against infection.  Platelets control bleeding.  Plasma helps clot blood.  Other blood products are available for specialized needs, such as hemophilia or other clotting disorders. BEFORE THE TRANSFUSION  Who gives blood for transfusions?   You may be able to donate blood to be used at a later date on yourself (autologous donation).  Relatives can be asked to donate blood. This is generally not any safer than if you have received blood from a stranger. The same precautions are taken to ensure safety when a relative's blood is donated.  Healthy volunteers who are fully evaluated to make sure their blood is safe. This is blood bank blood. Transfusion therapy is the safest it has ever been in the practice of medicine. Before blood is taken from a donor, a complete history is taken to make sure that person has no history of diseases nor engages in risky social behavior (examples are intravenous drug use or sexual activity with multiple partners). The donor's travel history is screened to minimize risk of transmitting infections, such as malaria. The donated blood is tested for signs of infectious diseases, such as HIV and hepatitis. The blood is then tested to be sure it is compatible with you in order to minimize the chance of a transfusion reaction. If you or a relative donates blood, this is often done in anticipation of surgery and is not appropriate for emergency situations. It takes many days to process the donated blood. RISKS AND COMPLICATIONS Although transfusion therapy is very safe and saves many lives, the main dangers of transfusion include:   Getting an infectious disease.  Developing a  transfusion reaction. This is an allergic reaction to something in the blood you were given. Every precaution is taken to prevent this. The decision to have a blood transfusion has been considered carefully by your caregiver before blood is given. Blood is not given unless the benefits outweigh the risks. AFTER THE TRANSFUSION  Right after receiving a blood transfusion, you will usually feel much better and more energetic. This is especially true if your red blood cells have gotten low (anemic). The transfusion raises the level of the red blood cells which carry oxygen, and this usually causes an energy increase.  The nurse administering the transfusion will monitor you carefully for complications. HOME CARE INSTRUCTIONS  No special instructions are needed after a transfusion. You may find your energy is better. Speak with your caregiver about any limitations on activity for underlying diseases you may have. SEEK MEDICAL CARE IF:   Your condition is not improving after your transfusion.  You develop redness or irritation at the intravenous (IV) site. SEEK IMMEDIATE MEDICAL CARE IF:  Any of the following symptoms occur over the next 12 hours:  Shaking chills.  You have a temperature by mouth above 102 F (38.9 C), not controlled by medicine.  Chest, back, or muscle pain.  People around you feel you are not acting correctly or are confused.  Shortness of breath or difficulty breathing.  Dizziness and fainting.  You get a rash or develop hives.  You have a decrease in urine output.  Your urine turns a dark color or changes to pink, red, or brown. Any of the following   symptoms occur over the next 10 days:  You have a temperature by mouth above 102 F (38.9 C), not controlled by medicine.  Shortness of breath.  Weakness after normal activity.  The white part of the eye turns yellow (jaundice).  You have a decrease in the amount of urine or are urinating less often.  Your  urine turns a dark color or changes to pink, red, or brown. Document Released: 11/16/2000 Document Revised: 02/11/2012 Document Reviewed: 07/05/2008 [facility] Patient Information 2014 [place]. Iron-Rich Diet An iron-rich diet contains foods that are good sources of iron. Iron is an important mineral that helps your body produce hemoglobin. Hemoglobin is a protein in red blood cells that carries oxygen to the body's tissues. Sometimes, the iron level in your blood can be low. This may be caused by:  A lack of iron in your diet.  Blood loss.  Times of growth, such as during pregnancy or during a child's growth and development. Low levels of iron can cause a decrease in the number of red blood cells. This can result in iron deficiency anemia. Iron deficiency anemia symptoms include:  Tiredness.  Weakness.  Irritability.  Increased chance of infection. Here are some recommendations for daily iron intake:  Males older than 41 years of age need 8 mg of iron per day.  Women ages 53 to 73 need 18 mg of iron per day.  Pregnant women need 27 mg of iron per day, and women who are over 21 years of age and breastfeeding need 9 mg of iron per day.  Women over the age of 110 need 8 mg of iron per day. SOURCES OF IRON There are 2 types of iron that are found in food: heme iron and nonheme iron. Heme iron is absorbed by the body better than nonheme iron. Heme iron is found in meat, poultry, and fish. Nonheme iron is found in grains, beans, and vegetables. Heme Iron Sources Food / Iron (mg)  Chicken liver, 3 oz (85 g)/ 10 mg  Beef liver, 3 oz (85 g)/ 5.5 mg  Oysters, 3 oz (85 g)/ 8 mg  Beef, 3 oz (85 g)/ 2 to 3 mg  Shrimp, 3 oz (85 g)/ 2.8 mg  [place], 3 oz (85 g)/ 2 mg  Chicken, 3 oz (85 g) / 1 mg  Fish (tuna, halibut), 3 oz (85 g)/ 1 mg  Pork, 3 oz (85 g)/ 0.9 mg Nonheme Iron Sources Food / Iron (mg)  Ready-to-eat breakfast cereal, iron-fortified / 3.9 to 7 mg  Tofu,   cup / 3.4 mg  Kidney beans,  cup / 2.6 mg  Baked potato with skin / 2.7 mg  Asparagus,  cup / 2.2 mg  Avocado / 2 mg  Dried peaches,  cup / 1.6 mg  Raisins,  cup / 1.5 mg  Soy milk, 1 cup / 1.5 mg  Whole-wheat bread, 1 slice / 1.2 mg  Spinach, 1 cup / 0.8 mg  Broccoli,  cup / 0.6 mg IRON ABSORPTION Certain foods can decrease the body's absorption of iron. Try to avoid these foods and beverages while eating meals with iron-containing foods:  Coffee.  Tea.  Fiber.  Soy. Foods containing vitamin C can help increase the amount of iron your body absorbs from iron sources, especially from nonheme sources. Eat foods with vitamin C along with iron-containing foods to increase your iron absorption. Foods that are high in vitamin C include many fruits and vegetables. Some good sources are:  Fresh  orange juice.  Oranges.  Strawberries.  Mangoes.  Grapefruit.  Red bell peppers.  Green bell peppers.  Broccoli.  Potatoes with skin.  Tomato juice. Document Released: 07/03/2005 Document Revised: 02/11/2012 Document Reviewed: 05/10/2011 [facility] Patient Information 2014 [place], [place].

## 2014-03-04 NOTE — Progress Notes (Signed)
Hematology and Oncology Follow Up Visit  Tamara [name] [id number] [birth year] 43 y.o. 03/05/2014 9:10 AM     Principle Diagnosis:Tamara [name] 43 y.o. 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     Prior Therapy:  #1 The patient palpated a left breast mass in the upper outer quadrant. Mammogram showed a 3.2 cm mass in the 2:00 position 7 cm from the nipple. There was also noted to be an 8 mm mass 8 cm from the nipple in the ipsilateral breast. She was also noted to have positive lymph nodes. She had a biopsy of both masses performed as well as the lymph node. 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 MRI confirmed 2.8 and 1.3 cm mass is as well as lymph node. On the right he was noted to have a 6 mm nodule. This has been biopsied and is negative.   #2 staging scans: PET scan  1. Hypermetabolic left breast mass consistent with known neoplasm.  There are also FDG positive left axillary lymph nodes.  2. Benign-appearing brown fat activity and muscular activity in the  neck and chest but no findings for metastatic disease involving the  neck, chest, abdomen or pelvis.  3. No findings for metastatic bone disease.  4. Moderate FDG activity in the endometrial canal is likely due to  secretory phase of ovulation or menses. No mass is seen.  5. Uterine fibroids  CT SCAN:  1. 3 cm left breast mass corresponding to the patient's known  neoplasm. Enlarged left axillary lymph nodes are positive on the PET  scan.  2. No CT findings for metastatic disease involving the chest,  abdomen or pelvis and no evidence of osseous metastatic disease.  3. Mildly enlarged fibroid uterus.   #3 genetic testing: Patient is a BRCA1 mutation carrier   #4 echocardiogram:  02/19/2014: Left ventricular ejection  fraction 45-50%. Patient was seen by cardiology and they have cleared her for chemotherapy with anti-HER-2-based regimen   #5 neoadjuvant chemotherapy: Patient recommended Taxotere carboplatinum Herceptin and perjeta every 3 weeks for a total of 6 cycles. She started this therapy on 02/25/14.   Current therapy:  Taxotere/Carboplatin/Herceptin/Perjeta cycle 1 day 8  Interim History: Tamara [name] 43 y.o. 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  She is receiving Taxotere/Carboplatin/Herceptin/Perjeta in the neoadjuvant setting.  She is here for f/u after receiving this therapy last week.  She received Neulasta on day 2.  She is doing moderately well today.  She is experiencing significant nausea.  She vomited twice on Monday, but hasn't since.  After having a bowel movement she did feel slightly better.  Her appetite is decreased, but she is eating.  Her fluid intake is not very good right now.  She is drinking 3 glasses of water a day.  Approximately 32 ounces of water per day.  She is anemic today.  She is dizzy.  She received IV iron last week.  She has increased DOE and fatigue.  She has a sore throat.  She denies any nasal congestion or post nasal drip, she denies any mouth sores or ulcerations.  Otherwise, she denies fevers, chills, constipation, diarrhea, numbness, tingling, fingernail/toenail changes, or any other concerns.  Otherwise, a 10 point ROS is neg.    Medications:  Current Outpatient Prescriptions  Medication Sig Dispense Refill  .  hydrochlorothiazide (HYDRODIURIL) 25 MG tablet Take 25 mg by mouth every morning.      . lidocaine-prilocaine (EMLA) cream Apply topically as needed.  30 g  6  . LORazepam (ATIVAN) 0.5 MG tablet Take 1 tablet (0.5 mg total) by mouth every 6 (six) hours as needed (Nausea or vomiting).  30 tablet  0  . ondansetron (ZOFRAN) 8 MG tablet Take 1 tablet (8 mg total) by mouth 2 (two) times daily. Start the day after chemo  for 3 days. Then take as needed for nausea or vomiting.  30 tablet  1  . oxyCODONE-acetaminophen (ROXICET) 5-325 MG per tablet Take 1 tablet by mouth every 4 (four) hours as needed.  30 tablet  0  . prochlorperazine (COMPAZINE) 10 MG tablet Take 1 tablet (10 mg total) by mouth every 6 (six) hours as needed (Nausea or vomiting).  30 tablet  1  . dexamethasone (DECADRON) 4 MG tablet Take 2 tablets (8 mg total) by mouth 2 (two) times daily. Start the day before Taxotere. Then again the day after chemo for 3 days.  30 tablet  1  . ferrous sulfate 325 (65 FE) MG EC tablet Take 1 tablet (325 mg total) by mouth 3 (three) times daily with meals.  90 tablet  3   No current facility-administered medications for this visit.     Allergies: No Known Allergies  Medical History: Past Medical History  Diagnosis Date  . Endometriosis   . Hypertension   . Rash     fine rash on abd  . Anemia   . Wears glasses   . 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 Iron deficiency anemia, unspecified 02/25/2014    Surgical History:  Past Surgical History  Procedure Laterality Date  . Cervical polypectomy  2010  . Cesarean section      one previous  . Tubal ligation    . Portacath placement Right 02/23/2014    Procedure: INSERTION PORT-A-CATH;  Surgeon: Joyice Faster. Cornett, MD;  Location: [facility];  Service: General;  Laterality: Right;     Review of Systems: A 10 point review of systems was conducted and is otherwise negative except for what is noted above.     Physical Exam: Blood pressure 126/80, pulse 92, temperature 98.3 F (36.8 C), temperature source Oral, resp. rate 18, height 5' 5"  (1.651 m), weight 195 lb 9.6 oz (88.724 kg), last menstrual period 02/19/2014. GENERAL: Patient is a well appearing female in no acute distress HEENT:  Sclerae anicteric.  Oropharynx clear and moist. No ulcerations or evidence of oropharyngeal candidiasis. Neck is supple.  NODES:  No cervical,  supraclavicular, or axillary lymphadenopathy palpated.  BREAST EXAM: small mass in left breast LUNGS:  Clear to auscultation bilaterally.  No wheezes or rhonchi. HEART:  Regular rate and rhythm. No murmur appreciated. ABDOMEN:  Soft, nontender.  Positive, normoactive bowel sounds. No organomegaly palpated. MSK:  No focal spinal tenderness to palpation. Full range of motion bilaterally in the upper extremities. EXTREMITIES:  No peripheral edema.   SKIN:  Clear with no obvious rashes or skin changes. No nail dyscrasia. NEURO:  Nonfocal. Well oriented.  Appropriate affect. ECOG PERFORMANCE STATUS: 1 - Symptomatic but completely ambulatory   Lab Results: Lab Results  Component Value Date   WBC 31.9* 03/04/2014   HGB 7.0 Repeated and Verified* 03/04/2014   HCT 24.0* 03/04/2014   MCV 61.7* 03/04/2014   PLT 438* 03/04/2014     Chemistry  Component Value Date/Time   NA 141 03/04/2014 0810   NA 139 02/18/2014 0910   K 4.7 03/04/2014 0810   K 4.2 02/18/2014 0910   CL 99 02/18/2014 0910   CO2 24 03/04/2014 0810   CO2 26 02/18/2014 0910   BUN 12.0 03/04/2014 0810   BUN 13 02/18/2014 0910   CREATININE 0.8 03/04/2014 0810   CREATININE 0.87 02/18/2014 0910      Component Value Date/Time   CALCIUM 9.3 03/04/2014 0810   CALCIUM 9.4 02/18/2014 0910   ALKPHOS 141 03/04/2014 0810   AST 25 03/04/2014 0810   ALT 29 03/04/2014 0810   BILITOT <0.20 03/04/2014 0810     Assessment and Plan: Tamara [name] 43 y.o. female with  1. Stage II HER-2/neu positive invasive ductal carcinoma of the left breast:  Patient is s/p her first cycle of Taxotere/Carboplatin/Herceptin/Perjeta.  She tolerated it relatively well.  She is subsequently nauseated and dehydrated from treatment.  Her CBC is stable.  I reviewed this with her in detail.  A CMP is pending.  2.  Nausea/Vomiting.  The patient will continue taking her prescribed anti-emetics for nausea.  She will return tomorrow for IV nausea medication.  3.  Dehydration: I encouraged the  patient to increase her PO intake.  She couldn't stay today for IV fluids, she will receive some tomorrow.    4.  Iron deficiency anemia.  Patient is s/p IV feraheme last week. Her hemoglobin is down to 7 and she is overtly symptomatic.  I counseled her on receiving a blood transfusion.  She is agreeable and will return tomorrow for 2 units of PRBCs.    The patient will return tomorrow for 2 units of PRBCs, IV hydration, and IV anti-emetics.  She will return in 2 weeks for labs, evaluation, and cycle 2 of Taxotere/Carboplatin/Herceptin/Perjeta.  She knows to call us in the interim should she have any questions or concerns.  We can certainly see her sooner if needed.   I spent 25 minutes counseling the patient face to face.  The total time spent in the appointment was 30 minutes.  Minette Headland, [place] [phone] 03/05/2014 9:10 AM

## 2014-03-05 ENCOUNTER — Ambulatory Visit (HOSPITAL_BASED_OUTPATIENT_CLINIC_OR_DEPARTMENT_OTHER): Payer: Medicaid Other

## 2014-03-05 ENCOUNTER — Telehealth: Payer: Self-pay | Admitting: *Deleted

## 2014-03-05 VITALS — BP 142/90 | HR 80 | Temp 98.2°F | Resp 20

## 2014-03-05 DIAGNOSIS — D509 Iron deficiency anemia, unspecified: Secondary | ICD-10-CM

## 2014-03-05 DIAGNOSIS — D649 Anemia, unspecified: Secondary | ICD-10-CM

## 2014-03-05 LAB — PREPARE RBC (CROSSMATCH)

## 2014-03-05 MED ORDER — ACETAMINOPHEN 325 MG PO TABS
650.0000 mg | ORAL_TABLET | Freq: Once | ORAL | Status: AC
  Administered 2014-03-05: 650 mg via ORAL

## 2014-03-05 MED ORDER — HEPARIN SOD (PORK) LOCK FLUSH 100 UNIT/ML IV SOLN
500.0000 [IU] | Freq: Every day | INTRAVENOUS | Status: DC | PRN
  Filled 2014-03-05: qty 5

## 2014-03-05 MED ORDER — DIPHENHYDRAMINE HCL 25 MG PO CAPS
25.0000 mg | ORAL_CAPSULE | Freq: Once | ORAL | Status: AC
  Administered 2014-03-05: 25 mg via ORAL

## 2014-03-05 MED ORDER — DIPHENHYDRAMINE HCL 25 MG PO CAPS
ORAL_CAPSULE | ORAL | Status: AC
  Filled 2014-03-05: qty 1

## 2014-03-05 MED ORDER — ACETAMINOPHEN 325 MG PO TABS
ORAL_TABLET | ORAL | Status: AC
  Filled 2014-03-05: qty 2

## 2014-03-05 MED ORDER — SODIUM CHLORIDE 0.9 % IJ SOLN
10.0000 mL | INTRAMUSCULAR | Status: DC | PRN
  Filled 2014-03-05: qty 10

## 2014-03-05 MED ORDER — SODIUM CHLORIDE 0.9 % IV SOLN
250.0000 mL | Freq: Once | INTRAVENOUS | Status: AC
  Administered 2014-03-05: 250 mL via INTRAVENOUS

## 2014-03-05 NOTE — Telephone Encounter (Signed)
Per 4/3 POF I have scheduled appt

## 2014-03-05 NOTE — Telephone Encounter (Signed)
Left message for pt to return my call so I can schedule a genetic appt.  

## 2014-03-05 NOTE — Patient Instructions (Signed)
Blood Transfusion Information WHAT IS A BLOOD TRANSFUSION? A transfusion is the replacement of blood or some of its parts. Blood is made up of multiple cells which provide different functions.  Red blood cells carry oxygen and are used for blood loss replacement.  White blood cells fight against infection.  Platelets control bleeding.  Plasma helps clot blood.  Other blood products are available for specialized needs, such as hemophilia or other clotting disorders. BEFORE THE TRANSFUSION  Who gives blood for transfusions?   You may be able to donate blood to be used at a later date on yourself (autologous donation).  Relatives can be asked to donate blood. This is generally not any safer than if you have received blood from a stranger. The same precautions are taken to ensure safety when a relative's blood is donated.  Healthy volunteers who are fully evaluated to make sure their blood is safe. This is blood bank blood. Transfusion therapy is the safest it has ever been in the practice of medicine. Before blood is taken from a donor, a complete history is taken to make sure that person has no history of diseases nor engages in risky social behavior (examples are intravenous drug use or sexual activity with multiple partners). The donor's travel history is screened to minimize risk of transmitting infections, such as malaria. The donated blood is tested for signs of infectious diseases, such as HIV and hepatitis. The blood is then tested to be sure it is compatible with you in order to minimize the chance of a transfusion reaction. If you or a relative donates blood, this is often done in anticipation of surgery and is not appropriate for emergency situations. It takes many days to process the donated blood. RISKS AND COMPLICATIONS Although transfusion therapy is very safe and saves many lives, the main dangers of transfusion include:   Getting an infectious disease.  Developing a  transfusion reaction. This is an allergic reaction to something in the blood you were given. Every precaution is taken to prevent this. The decision to have a blood transfusion has been considered carefully by your caregiver before blood is given. Blood is not given unless the benefits outweigh the risks. AFTER THE TRANSFUSION  Right after receiving a blood transfusion, you will usually feel much better and more energetic. This is especially true if your red blood cells have gotten low (anemic). The transfusion raises the level of the red blood cells which carry oxygen, and this usually causes an energy increase.  The nurse administering the transfusion will monitor you carefully for complications. HOME CARE INSTRUCTIONS  No special instructions are needed after a transfusion. You may find your energy is better. Speak with your caregiver about any limitations on activity for underlying diseases you may have. SEEK MEDICAL CARE IF:   Your condition is not improving after your transfusion.  You develop redness or irritation at the intravenous (IV) site. SEEK IMMEDIATE MEDICAL CARE IF:  Any of the following symptoms occur over the next 12 hours:  Shaking chills.  You have a temperature by mouth above 102 F (38.9 C), not controlled by medicine.  Chest, back, or muscle pain.  People around you feel you are not acting correctly or are confused.  Shortness of breath or difficulty breathing.  Dizziness and fainting.  You get a rash or develop hives.  You have a decrease in urine output.  Your urine turns a dark color or changes to pink, red, or brown. Any of the following   symptoms occur over the next 10 days:  You have a temperature by mouth above 102 F (38.9 C), not controlled by medicine.  Shortness of breath.  Weakness after normal activity.  The white part of the eye turns yellow (jaundice).  You have a decrease in the amount of urine or are urinating less often.  Your  urine turns a dark color or changes to pink, red, or brown. Document Released: 11/16/2000 Document Revised: 02/11/2012 Document Reviewed: 07/05/2008 ExitCare Patient Information 2014 [employer].  

## 2014-03-05 NOTE — Progress Notes (Signed)
Pt here for blood transfusion.  Became quite upset for not knowing to apply Emla cream to portacath.  Offered for ice glove applying to port prior to accessing.  Pt refused and wished to have peripheral IV stick in right arm today for blood transfusion. Pt wished to receive IVF tomorrow 03/06/14.  OK with Ria Comment, NP to give IVF 03/06/14 instead of today since pt receiving 2 units of blood today.  Instructed pt to apply Emla cream to portacath prior to procedure in the am.

## 2014-03-06 ENCOUNTER — Ambulatory Visit (HOSPITAL_BASED_OUTPATIENT_CLINIC_OR_DEPARTMENT_OTHER): Payer: Medicaid Other

## 2014-03-06 VITALS — BP 124/84 | HR 72 | Temp 98.2°F

## 2014-03-06 DIAGNOSIS — D649 Anemia, unspecified: Secondary | ICD-10-CM

## 2014-03-06 DIAGNOSIS — C773 Secondary and unspecified malignant neoplasm of axilla and upper limb lymph nodes: Secondary | ICD-10-CM

## 2014-03-06 DIAGNOSIS — R110 Nausea: Secondary | ICD-10-CM

## 2014-03-06 DIAGNOSIS — C50419 Malignant neoplasm of upper-outer quadrant of unspecified female breast: Secondary | ICD-10-CM

## 2014-03-06 MED ORDER — ONDANSETRON 16 MG/50ML IVPB (CHCC)
16.0000 mg | Freq: Once | INTRAVENOUS | Status: AC
  Administered 2014-03-06: 16 mg via INTRAVENOUS

## 2014-03-06 MED ORDER — HEPARIN SOD (PORK) LOCK FLUSH 100 UNIT/ML IV SOLN
500.0000 [IU] | Freq: Once | INTRAVENOUS | Status: AC
  Administered 2014-03-06: 500 [IU] via INTRAVENOUS
  Filled 2014-03-06: qty 5

## 2014-03-06 MED ORDER — SODIUM CHLORIDE 0.9 % IV SOLN
INTRAVENOUS | Status: DC
  Administered 2014-03-06: 09:00:00 via INTRAVENOUS

## 2014-03-06 MED ORDER — SODIUM CHLORIDE 0.9 % IJ SOLN
10.0000 mL | INTRAMUSCULAR | Status: DC | PRN
  Administered 2014-03-06: 10 mL via INTRAVENOUS
  Filled 2014-03-06: qty 10

## 2014-03-06 NOTE — Patient Instructions (Signed)
Dehydration, Adult Dehydration is when you lose more fluids from the body than you take in. Vital organs like the kidneys, brain, and heart cannot function without a proper amount of fluids and salt. Any loss of fluids from the body can cause dehydration.  CAUSES   Vomiting.  Diarrhea.  Excessive sweating.  Excessive urine output.  Fever. SYMPTOMS  Mild dehydration  Thirst.  Dry lips.  Slightly dry mouth. Moderate dehydration  Very dry mouth.  Sunken eyes.  Skin does not bounce back quickly when lightly pinched and released.  Dark urine and decreased urine production.  Decreased tear production.  Headache. Severe dehydration  Very dry mouth.  Extreme thirst.  Rapid, weak pulse (more than 100 beats per minute at rest).  Cold hands and feet.  Not able to sweat in spite of heat and temperature.  Rapid breathing.  Blue lips.  Confusion and lethargy.  Difficulty being awakened.  Minimal urine production.  No tears. DIAGNOSIS  Your caregiver will diagnose dehydration based on your symptoms and your exam. Blood and urine tests will help confirm the diagnosis. The diagnostic evaluation should also identify the cause of dehydration. TREATMENT  Treatment of mild or moderate dehydration can often be done at home by increasing the amount of fluids that you drink. It is best to drink small amounts of fluid more often. Drinking too much at one time can make vomiting worse. Refer to the home care instructions below. Severe dehydration needs to be treated at the hospital where you will probably be given intravenous (IV) fluids that contain water and electrolytes. HOME CARE INSTRUCTIONS   Ask your caregiver about specific rehydration instructions.  Drink enough fluids to keep your urine clear or pale yellow.  Drink small amounts frequently if you have nausea and vomiting.  Eat as you normally do.  Avoid:  Foods or drinks high in sugar.  Carbonated  drinks.  Juice.  Extremely hot or cold fluids.  Drinks with caffeine.  Fatty, greasy foods.  Alcohol.  Tobacco.  Overeating.  Gelatin desserts.  Wash your hands well to avoid spreading bacteria and viruses.  Only take over-the-counter or prescription medicines for pain, discomfort, or fever as directed by your caregiver.  Ask your caregiver if you should continue all prescribed and over-the-counter medicines.  Keep all follow-up appointments with your caregiver. SEEK MEDICAL CARE IF:  You have abdominal pain and it increases or stays in one area (localizes).  You have a rash, stiff neck, or severe headache.  You are irritable, sleepy, or difficult to awaken.  You are weak, dizzy, or extremely thirsty. SEEK IMMEDIATE MEDICAL CARE IF:   You are unable to keep fluids down or you get worse despite treatment.  You have frequent episodes of vomiting or diarrhea.  You have blood or green matter (bile) in your vomit.  You have blood in your stool or your stool looks black and tarry.  You have not urinated in 6 to 8 hours, or you have only urinated a small amount of very dark urine.  You have a fever.  You faint. MAKE SURE YOU:   Understand these instructions.  Will watch your condition.  Will get help right away if you are not doing well or get worse. Document Released: 11/19/2005 Document Revised: 02/11/2012 Document Reviewed: 07/09/2011 ExitCare Patient Information 2014 [employer].  

## 2014-03-08 ENCOUNTER — Ambulatory Visit: Payer: Medicaid Other | Attending: Internal Medicine

## 2014-03-08 ENCOUNTER — Other Ambulatory Visit: Payer: Self-pay

## 2014-03-08 ENCOUNTER — Emergency Department (HOSPITAL_COMMUNITY): Payer: Medicaid Other

## 2014-03-08 ENCOUNTER — Emergency Department (HOSPITAL_COMMUNITY)
Admission: EM | Admit: 2014-03-08 | Discharge: 2014-03-08 | Disposition: A | Discharge status: Home / Self Care | Payer: Medicaid Other | Attending: Emergency Medicine | Admitting: Emergency Medicine

## 2014-03-08 ENCOUNTER — Telehealth: Payer: Self-pay | Admitting: *Deleted

## 2014-03-08 ENCOUNTER — Encounter (HOSPITAL_COMMUNITY): Payer: Self-pay | Admitting: Emergency Medicine

## 2014-03-08 DIAGNOSIS — M542 Cervicalgia: Secondary | ICD-10-CM | POA: Insufficient documentation

## 2014-03-08 DIAGNOSIS — D509 Iron deficiency anemia, unspecified: Secondary | ICD-10-CM | POA: Insufficient documentation

## 2014-03-08 DIAGNOSIS — Z87891 Personal history of nicotine dependence: Secondary | ICD-10-CM | POA: Insufficient documentation

## 2014-03-08 DIAGNOSIS — Z853 Personal history of malignant neoplasm of breast: Secondary | ICD-10-CM | POA: Insufficient documentation

## 2014-03-08 DIAGNOSIS — IMO0002 Reserved for concepts with insufficient information to code with codable children: Secondary | ICD-10-CM | POA: Insufficient documentation

## 2014-03-08 DIAGNOSIS — G8928 Other chronic postprocedural pain: Secondary | ICD-10-CM | POA: Insufficient documentation

## 2014-03-08 DIAGNOSIS — I10 Essential (primary) hypertension: Secondary | ICD-10-CM | POA: Insufficient documentation

## 2014-03-08 DIAGNOSIS — Z9089 Acquired absence of other organs: Secondary | ICD-10-CM | POA: Insufficient documentation

## 2014-03-08 DIAGNOSIS — Z8742 Personal history of other diseases of the female genital tract: Secondary | ICD-10-CM | POA: Insufficient documentation

## 2014-03-08 LAB — TYPE AND SCREEN
ABO/RH(D): O NEG
Antibody Screen: NEGATIVE
Unit division: 0
Unit division: 0

## 2014-03-08 MED ORDER — IBUPROFEN 400 MG PO TABS
600.0000 mg | ORAL_TABLET | Freq: Once | ORAL | Status: AC
  Administered 2014-03-08: 600 mg via ORAL
  Filled 2014-03-08 (×2): qty 1

## 2014-03-08 MED ORDER — OXYCODONE-ACETAMINOPHEN 5-325 MG PO TABS
2.0000 | ORAL_TABLET | Freq: Once | ORAL | Status: AC
  Administered 2014-03-08: 2 via ORAL
  Filled 2014-03-08: qty 2

## 2014-03-08 NOTE — ED Provider Notes (Signed)
CSN: [id number]     Arrival date & time 03/08/14  1031 History   First MD Initiated Contact with Patient 03/08/14 1048     Chief Complaint  Patient presents with  . Vascular Access Problem     (Consider location/radiation/quality/duration/timing/severity/associated sxs/prior Treatment) HPI  42yF with R neck pain at site of recent port placement. Procedure done 3/24. Initially attempted R subclavian but device kept tracking across innominate and converted to R IJ. Has been doing well and device has been subsequently accessed w/o incident. Yesterday was at church and reports being quite animated (shouting and moving around a lot) during service. Discomfort noted shortly later and has increased. No fever or chills. No drainage. No other acute complaints.   Past Medical History  Diagnosis Date  . Endometriosis   . Hypertension   . Rash     fine rash on abd  . Anemia   . Wears glasses   . 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 Iron deficiency anemia, unspecified 02/25/2014   Past Surgical History  Procedure Laterality Date  . Cervical polypectomy  2010  . Cesarean section      one previous  . Tubal ligation    . Portacath placement Right 02/23/2014    Procedure: INSERTION PORT-A-CATH;  Surgeon: Joyice Faster. Cornett, MD;  Location: [facility];  Service: General;  Laterality: Right;   Family History  Problem Relation Age of Onset  . Breast cancer Mother 25  . Diabetes Father   . Pancreatic cancer Father 43  . Uterine cancer Maternal Aunt     dx in her 21s-50s  . Breast cancer Paternal Aunt     dx in her 13s  . Stroke Maternal Grandfather   . Breast cancer Paternal Aunt     dx in her 24s-50s  . Breast cancer Cousin     paternal cousin dx in her 68s-40s   History  Substance Use Topics  . Smoking status: Former Smoker -- 0.25 packs/day for 15 years    Quit date: 02/18/2009  . Smokeless tobacco: Former Systems developer  . Alcohol Use: Yes     Comment: occasional   OB  History   Grav Para Term Preterm Abortions TAB SAB Ect Mult Living   5 5 5       5      Review of Systems  All systems reviewed and negative, other than as noted in HPI.   Allergies  Review of patient's allergies indicates no known allergies.  Home Medications   Current Outpatient Rx  Name  Route  Sig  Dispense  Refill  . dexamethasone (DECADRON) 4 MG tablet   Oral   Take 2 tablets (8 mg total) by mouth 2 (two) times daily. Start the day before Taxotere. Then again the day after chemo for 3 days.   30 tablet   1   . ferrous sulfate 325 (65 FE) MG EC tablet   Oral   Take 1 tablet (325 mg total) by mouth 3 (three) times daily with meals.   90 tablet   3   . lidocaine-prilocaine (EMLA) cream   Topical   Apply topically as needed.   30 g   6   . LORazepam (ATIVAN) 0.5 MG tablet   Oral   Take 1 tablet (0.5 mg total) by mouth every 6 (six) hours as needed (Nausea or vomiting).   30 tablet   0   . ondansetron (ZOFRAN) 8 MG tablet   Oral  Take 1 tablet (8 mg total) by mouth 2 (two) times daily. Start the day after chemo for 3 days. Then take as needed for nausea or vomiting.   30 tablet   1   . oxyCODONE-acetaminophen (PERCOCET/ROXICET) 5-325 MG per tablet   Oral   Take by mouth every 4 (four) hours as needed for severe pain.         Marland Kitchen prochlorperazine (COMPAZINE) 10 MG tablet   Oral   Take 1 tablet (10 mg total) by mouth every 6 (six) hours as needed (Nausea or vomiting).   30 tablet   1    BP 129/89  Pulse 76  Temp(Src) 98 F (36.7 C) (Oral)  Resp 20  Ht 5' 5"  (1.651 m)  Wt 195 lb (88.451 kg)  BMI 32.45 kg/m2  SpO2 97%  LMP 02/19/2014 Physical Exam  Nursing note and vitals reviewed. Constitutional: She appears well-developed and well-nourished. No distress.  HENT:  Head: Normocephalic.  R neck and R chest sites appearing to being healing w/o complication.  Some localized tenderness. No concerning skin changes or drainage.   Eyes: Conjunctivae are  normal. Right eye exhibits no discharge. Left eye exhibits no discharge.  Neck: Neck supple.  Cardiovascular: Normal rate, regular rhythm and normal heart sounds.  Exam reveals no gallop and no friction rub.   No murmur heard. Pulmonary/Chest: Effort normal and breath sounds normal. No respiratory distress.  Abdominal: Soft. She exhibits no distension. There is no tenderness.  Musculoskeletal: She exhibits no edema and no tenderness.  Neurological: She is alert.  Skin: Skin is warm and dry.  Psychiatric: She has a normal mood and affect. Her behavior is normal. Thought content normal.    ED Course  Procedures (including critical care time) Labs Review Labs Reviewed - No data to display Imaging Review Dg Chest 2 View  03/08/2014   CLINICAL DATA:  Port-A-Cath placement  EXAM: CHEST  2 VIEW  COMPARISON:  02/23/2014  FINDINGS: Cardiomediastinal silhouette is stable. Right IJ Port-A-Cath with tip in distal SVC. No pneumothorax. No acute infiltrate or pulmonary edema. Bony thorax is unremarkable.  IMPRESSION: No active cardiopulmonary disease.  Right IJ Port-A-Cath in place.   Electronically Signed   By: Lahoma Crocker M.D.   On: 03/08/2014 12:43     EKG Interpretation None      MDM   Final diagnoses:  Neck pain on right side    42yF with pain at site of recently placed R IJ portacath. My suspicion is that pt "over dit it" recently post-op from procedure. Externally the site looks appropriate with timing of recent procedure.  Incisions appear to be healing well. Placement looks fine on XR. Pt reports has appointment with plastic surgery tomorrow. Can be reassessed then. Additionally needs to make appointment with [facility] if having persistent issues. Emergent return precautions discussed.     Virgel Manifold, MD 03/08/14 [phone]

## 2014-03-08 NOTE — ED Notes (Signed)
Patient states area at port-a-cath site on the right upper chest is tender to touch and swollen.  Last accessed on Saturday and was given IV fluids and Zofran.

## 2014-03-08 NOTE — ED Notes (Signed)
Patient transported to X-ray 

## 2014-03-08 NOTE — ED Notes (Signed)
Pt had a portacath placed on 02/23/14 but noticed last night after going to church the site started swelling. Has not had any problems with it since the placement.

## 2014-03-08 NOTE — Telephone Encounter (Signed)
Left message for a return phone call to schedule patient to discuss genetic results with genetic counselor.  Awaiting patient response.

## 2014-03-09 ENCOUNTER — Encounter (INDEPENDENT_AMBULATORY_CARE_PROVIDER_SITE_OTHER): Payer: Self-pay | Admitting: General Surgery

## 2014-03-09 ENCOUNTER — Telehealth: Payer: Self-pay | Admitting: Oncology

## 2014-03-09 ENCOUNTER — Other Ambulatory Visit: Payer: Self-pay

## 2014-03-09 ENCOUNTER — Ambulatory Visit (INDEPENDENT_AMBULATORY_CARE_PROVIDER_SITE_OTHER): Payer: Medicaid Other | Admitting: General Surgery

## 2014-03-09 VITALS — BP 141/98 | HR 84 | Temp 97.2°F | Resp 16 | Ht 65.0 in | Wt 196.0 lb

## 2014-03-09 DIAGNOSIS — T80212A Local infection due to central venous catheter, initial encounter: Secondary | ICD-10-CM | POA: Insufficient documentation

## 2014-03-09 MED ORDER — DOXYCYCLINE HYCLATE 100 MG PO TABS: 100.0000 mg | ORAL_TABLET | Freq: Two times a day (BID) | ORAL | Status: DC

## 2014-03-09 NOTE — Assessment & Plan Note (Signed)
Patient does appear to have a mild infection at the incision of the Port-A-Cath. It is tender all along the path of the catheter tubing.  We will try one week of oral antibiotics.  She will followup with Dr. Brantley Stage next week.

## 2014-03-09 NOTE — Progress Notes (Signed)
HISTORY: Patient is a 43 year old female with breast cancer who is getting chemotherapy for breast cancer. She got her first chemotherapy around a week or 2 ago. The next one is due on the 16th of this month. At church services this weekend, she felt significant pain at the Port-A-Cath site. It has also started to have some swelling. She denies fevers or chills.    EXAM: General:  Alert and oriented.  Incision:  There is no significant cellulitis other than right at the incision. The incision is red.  There is no drainage.  The port site is swollen and tender. There is no fluctuance at the incision.      ASSESSMENT AND PLAN:   Local infection due to port-a-cath Patient does appear to have a mild infection at the incision of the Port-A-Cath. It is tender all along the path of the catheter tubing.  We will try one week of oral antibiotics.  She will followup with Dr. Brantley Stage next week.      Milus Height, MD Surgical Oncology, [facility]  Angelica Chessman, MD No ref. provider found

## 2014-03-09 NOTE — Progress Notes (Signed)
Report rcvd from [facility] Dr. Migdalia Dk dtd 03/09/14.  Provided to [place].

## 2014-03-09 NOTE — Progress Notes (Signed)
Per [place] - move pt to 12:15 appt on 4/16.  LC out of office in am.

## 2014-03-09 NOTE — Patient Instructions (Signed)
Take antibiotics twice daily.    Follow up in 1 week with Dr. Brantley Stage or myself.

## 2014-03-09 NOTE — Telephone Encounter (Signed)
S/w the pt and she is aware of her appt time on 03/18/2014.

## 2014-03-10 ENCOUNTER — Telehealth: Payer: Self-pay | Admitting: *Deleted

## 2014-03-10 NOTE — Telephone Encounter (Signed)
I have adjusted 4/16 appt

## 2014-03-11 ENCOUNTER — Encounter: Payer: Self-pay | Admitting: *Deleted

## 2014-03-11 NOTE — Progress Notes (Unsigned)
Today pt was provided with a letter for her mother Mat Carne to be excused from work when she comes to care for her daughter through treatment every three weeks thru the month of July. Signed by Charlestine Massed, NP.

## 2014-03-12 ENCOUNTER — Telehealth: Payer: Self-pay | Admitting: *Deleted

## 2014-03-12 NOTE — Telephone Encounter (Signed)
Called and spoke with patient and confirmed genetic appointment for 04/14/14 at 11am.

## 2014-03-15 ENCOUNTER — Ambulatory Visit (INDEPENDENT_AMBULATORY_CARE_PROVIDER_SITE_OTHER): Payer: Medicaid Other | Admitting: Surgery

## 2014-03-15 ENCOUNTER — Encounter (INDEPENDENT_AMBULATORY_CARE_PROVIDER_SITE_OTHER): Payer: Self-pay | Admitting: Surgery

## 2014-03-15 VITALS — BP 124/86 | HR 78 | Temp 97.9°F | Resp 16 | Wt 192.6 lb

## 2014-03-15 DIAGNOSIS — T80212A Local infection due to central venous catheter, initial encounter: Secondary | ICD-10-CM

## 2014-03-15 NOTE — Patient Instructions (Signed)
Keep neosporin over incision.  Looks well at this point but will need to follow closely.

## 2014-03-15 NOTE — Progress Notes (Signed)
Patient returns after Port-A-Cath placement weeks ago. She was  seen last week due to  redness and swelling around the port site. He has improved on antibiotics. Her pain is much better.  Exam: Port site intact with minimal separation medial incision of the millimeter. No redness or hematoma. No redness along catheter site.  Impression: Stage II breast cancer status post insertion of Port-A-Cath superficial infection treated with antibiotics   Plan: Improved. May begin chemotherapy this week. Return in 3 weeks. Instructions for close monitoring given the patient if she has excessive redness, drainage, fever or chills during therapy.

## 2014-03-18 ENCOUNTER — Encounter: Payer: Self-pay | Admitting: Oncology

## 2014-03-18 ENCOUNTER — Other Ambulatory Visit: Payer: Self-pay

## 2014-03-18 ENCOUNTER — Other Ambulatory Visit (HOSPITAL_BASED_OUTPATIENT_CLINIC_OR_DEPARTMENT_OTHER): Payer: Medicaid Other

## 2014-03-18 ENCOUNTER — Ambulatory Visit (HOSPITAL_BASED_OUTPATIENT_CLINIC_OR_DEPARTMENT_OTHER): Payer: Medicaid Other

## 2014-03-18 ENCOUNTER — Ambulatory Visit: Payer: Medicaid Other | Admitting: Adult Health

## 2014-03-18 ENCOUNTER — Ambulatory Visit (HOSPITAL_BASED_OUTPATIENT_CLINIC_OR_DEPARTMENT_OTHER): Payer: Medicaid Other | Admitting: Adult Health

## 2014-03-18 VITALS — BP 138/79 | HR 67 | Temp 98.0°F | Resp 18 | Ht 65.0 in | Wt 193.8 lb

## 2014-03-18 DIAGNOSIS — C773 Secondary and unspecified malignant neoplasm of axilla and upper limb lymph nodes: Secondary | ICD-10-CM

## 2014-03-18 DIAGNOSIS — C50412 Malignant neoplasm of upper-outer quadrant of left female breast: Secondary | ICD-10-CM

## 2014-03-18 DIAGNOSIS — Z170 Estrogen receptor positive status [ER+]: Secondary | ICD-10-CM

## 2014-03-18 DIAGNOSIS — C50419 Malignant neoplasm of upper-outer quadrant of unspecified female breast: Secondary | ICD-10-CM

## 2014-03-18 DIAGNOSIS — Z5111 Encounter for antineoplastic chemotherapy: Secondary | ICD-10-CM

## 2014-03-18 DIAGNOSIS — Z5112 Encounter for antineoplastic immunotherapy: Secondary | ICD-10-CM

## 2014-03-18 DIAGNOSIS — D509 Iron deficiency anemia, unspecified: Secondary | ICD-10-CM

## 2014-03-18 LAB — CBC WITH DIFFERENTIAL/PLATELET
BASO%: 0.5 % (ref 0.0–2.0)
Basophils Absolute: 0.1 10*3/uL (ref 0.0–0.1)
EOS ABS: 0 10*3/uL (ref 0.0–0.5)
EOS%: 0.1 % (ref 0.0–7.0)
HEMATOCRIT: 33.7 % — AB (ref 34.8–46.6)
HGB: 10.5 g/dL — ABNORMAL LOW (ref 11.6–15.9)
LYMPH%: 14.1 % (ref 14.0–49.7)
MCH: 22.9 pg — ABNORMAL LOW (ref 25.1–34.0)
MCHC: 31.1 g/dL — AB (ref 31.5–36.0)
MCV: 73.8 fL — ABNORMAL LOW (ref 79.5–101.0)
MONO#: 0.9 10*3/uL (ref 0.1–0.9)
MONO%: 6.7 % (ref 0.0–14.0)
NEUT%: 78.6 % — AB (ref 38.4–76.8)
NEUTROS ABS: 10.4 10*3/uL — AB (ref 1.5–6.5)
Platelets: 374 10*3/uL (ref 145–400)
RBC: 4.56 10*6/uL (ref 3.70–5.45)
RDW: 34.8 % — ABNORMAL HIGH (ref 11.2–14.5)
WBC: 13.3 10*3/uL — ABNORMAL HIGH (ref 3.9–10.3)
lymph#: 1.9 10*3/uL (ref 0.9–3.3)

## 2014-03-18 LAB — COMPREHENSIVE METABOLIC PANEL (CC13)
ALBUMIN: 3.9 g/dL (ref 3.5–5.0)
ALK PHOS: 77 U/L (ref 40–150)
ALT: 18 U/L (ref 0–55)
AST: 15 U/L (ref 5–34)
Anion Gap: 9 mEq/L (ref 3–11)
BUN: 13.9 mg/dL (ref 7.0–26.0)
CO2: 25 mEq/L (ref 22–29)
CREATININE: 0.8 mg/dL (ref 0.6–1.1)
Calcium: 10 mg/dL (ref 8.4–10.4)
Chloride: 107 mEq/L (ref 98–109)
Glucose: 99 mg/dl (ref 70–140)
POTASSIUM: 3.7 meq/L (ref 3.5–5.1)
Sodium: 140 mEq/L (ref 136–145)
Total Bilirubin: 0.35 mg/dL (ref 0.20–1.20)
Total Protein: 7.4 g/dL (ref 6.4–8.3)

## 2014-03-18 LAB — IRON AND TIBC CHCC
%SAT: 42 % (ref 21–57)
Iron: 120 ug/dL (ref 41–142)
TIBC: 290 ug/dL (ref 236–444)
UIBC: 170 ug/dL (ref 120–384)

## 2014-03-18 LAB — FERRITIN CHCC: Ferritin: 348 ng/ml — ABNORMAL HIGH (ref 9–269)

## 2014-03-18 MED ORDER — ONDANSETRON 16 MG/50ML IVPB (CHCC)
16.0000 mg | Freq: Once | INTRAVENOUS | Status: AC
  Administered 2014-03-18: 16 mg via INTRAVENOUS

## 2014-03-18 MED ORDER — DOCETAXEL CHEMO INJECTION 160 MG/16ML: 75.0000 mg/m2 | Freq: Once | INTRAVENOUS | Status: DC

## 2014-03-18 MED ORDER — HEPARIN SOD (PORK) LOCK FLUSH 100 UNIT/ML IV SOLN
500.0000 [IU] | Freq: Once | INTRAVENOUS | Status: AC | PRN
  Administered 2014-03-18: 500 [IU]
  Filled 2014-03-18: qty 5

## 2014-03-18 MED ORDER — DEXAMETHASONE SODIUM PHOSPHATE 20 MG/5ML IJ SOLN
20.0000 mg | Freq: Once | INTRAMUSCULAR | Status: AC
  Administered 2014-03-18: 20 mg via INTRAVENOUS

## 2014-03-18 MED ORDER — PERTUZUMAB CHEMO INJECTION 420 MG/14ML
420.0000 mg | Freq: Once | INTRAVENOUS | Status: AC
  Administered 2014-03-18: 420 mg via INTRAVENOUS
  Filled 2014-03-18: qty 14

## 2014-03-18 MED ORDER — SODIUM CHLORIDE 0.9 % IV SOLN
843.6000 mg | Freq: Once | INTRAVENOUS | Status: AC
  Administered 2014-03-18: 840 mg via INTRAVENOUS
  Filled 2014-03-18: qty 84

## 2014-03-18 MED ORDER — TRASTUZUMAB CHEMO INJECTION 440 MG
6.0000 mg/kg | Freq: Once | INTRAVENOUS | Status: AC
  Administered 2014-03-18: 525 mg via INTRAVENOUS
  Filled 2014-03-18: qty 25

## 2014-03-18 MED ORDER — DEXAMETHASONE SODIUM PHOSPHATE 20 MG/5ML IJ SOLN
INTRAMUSCULAR | Status: AC
  Filled 2014-03-18: qty 5

## 2014-03-18 MED ORDER — SODIUM CHLORIDE 0.9 % IV SOLN
Freq: Once | INTRAVENOUS | Status: AC
  Administered 2014-03-18: 14:00:00 via INTRAVENOUS

## 2014-03-18 MED ORDER — DIPHENHYDRAMINE HCL 25 MG PO CAPS
50.0000 mg | ORAL_CAPSULE | Freq: Once | ORAL | Status: AC
  Administered 2014-03-18: 50 mg via ORAL

## 2014-03-18 MED ORDER — ONDANSETRON 16 MG/50ML IVPB (CHCC)
INTRAVENOUS | Status: AC
  Filled 2014-03-18: qty 16

## 2014-03-18 MED ORDER — SODIUM CHLORIDE 0.9 % IV SOLN
75.0000 mg/m2 | Freq: Once | INTRAVENOUS | Status: AC
  Administered 2014-03-18: 150 mg via INTRAVENOUS
  Filled 2014-03-18: qty 15

## 2014-03-18 MED ORDER — SODIUM CHLORIDE 0.9 % IJ SOLN
10.0000 mL | INTRAMUSCULAR | Status: DC | PRN
  Administered 2014-03-18: 10 mL
  Filled 2014-03-18: qty 10

## 2014-03-18 MED ORDER — ACETAMINOPHEN 325 MG PO TABS
650.0000 mg | ORAL_TABLET | Freq: Once | ORAL | Status: AC
  Administered 2014-03-18: 650 mg via ORAL

## 2014-03-18 MED ORDER — ACETAMINOPHEN 325 MG PO TABS
ORAL_TABLET | ORAL | Status: AC
  Filled 2014-03-18: qty 2

## 2014-03-18 MED ORDER — DIPHENHYDRAMINE HCL 25 MG PO CAPS
ORAL_CAPSULE | ORAL | Status: AC
  Filled 2014-03-18: qty 2

## 2014-03-18 NOTE — Patient Instructions (Signed)
[facility] Discharge Instructions for Patients Receiving Chemotherapy  Today you received the following chemotherapy agents : Herceptin, Perjeta, Taxotere & Carboplatin  To help prevent nausea and vomiting after your treatment, we encourage you to take your nausea medications as directed:   Decadron 8 mg twice daily X 3 days (start tomorrow)  Zofran 8 mg twice daily X 3 days (start tomorrow), then every 12 hours as needed  Ativan 0.5 mg every 6 hours as needed  Compazine 10 mg every 6 hours as needed   If you develop nausea and vomiting that is not controlled by your nausea medication, call the clinic.   Remember to pick up your iron supplement tomorrow from [facility] and take twice daily.  BELOW ARE SYMPTOMS THAT SHOULD BE REPORTED IMMEDIATELY:  *FEVER GREATER THAN 100.5 F  *CHILLS WITH OR WITHOUT FEVER  NAUSEA AND VOMITING THAT IS NOT CONTROLLED WITH YOUR NAUSEA MEDICATION  *UNUSUAL SHORTNESS OF BREATH  *UNUSUAL BRUISING OR BLEEDING  TENDERNESS IN MOUTH AND THROAT WITH OR WITHOUT PRESENCE OF ULCERS  *URINARY PROBLEMS  *BOWEL PROBLEMS  UNUSUAL RASH Items with * indicate a potential emergency and should be followed up as soon as possible.  Feel free to call the clinic should you have any questions or concerns. The clinic phone number is [phone].  It has been a pleasure to serve you today!

## 2014-03-18 NOTE — Progress Notes (Signed)
Hematology and Oncology Follow Up Visit  Tamara [name] [id number] March 29, 1971 43 y.o. 03/20/2014 9:31 AM     Principle Diagnosis:Tamara [name] 43 y.o. 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     Prior Therapy:  #1 The patient palpated a left breast mass in the upper outer quadrant. Mammogram showed a 3.2 cm mass in the 2:00 position 7 cm from the nipple. There was also noted to be an 8 mm mass 8 cm from the nipple in the ipsilateral breast. She was also noted to have positive lymph nodes. She had a biopsy of both masses performed as well as the lymph node. 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 MRI confirmed 2.8 and 1.3 cm mass is as well as lymph node. On the right he was noted to have a 6 mm nodule. This has been biopsied and is negative.   #2 staging scans: PET scan  1. Hypermetabolic left breast mass consistent with known neoplasm.  There are also FDG positive left axillary lymph nodes.  2. Benign-appearing brown fat activity and muscular activity in the  neck and chest but no findings for metastatic disease involving the  neck, chest, abdomen or pelvis.  3. No findings for metastatic bone disease.  4. Moderate FDG activity in the endometrial canal is likely due to  secretory phase of ovulation or menses. No mass is seen.  5. Uterine fibroids  CT SCAN:  1. 3 cm left breast mass corresponding to the patient's known  neoplasm. Enlarged left axillary lymph nodes are positive on the PET  scan.  2. No CT findings for metastatic disease involving the chest,  abdomen or pelvis and no evidence of osseous metastatic disease.  3. Mildly enlarged fibroid uterus.   #3 genetic testing: Patient is a BRCA1 mutation carrier   #4 echocardiogram:  02/19/2014: Left ventricular ejection  fraction 45-50%. Patient was seen by cardiology and they have cleared her for chemotherapy with anti-HER-2-based regimen   #5 neoadjuvant chemotherapy: Patient recommended Taxotere carboplatinum Herceptin and perjeta every 3 weeks for a total of 6 cycles. She started this therapy on 02/25/14.   Current therapy:  Neoadjuvant Taxotere, Carboplatin, Herceptin and Perjeta cycle 2 day 1  Interim History: Tamara [name] 43 y.o. 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  She is doing well today.  She did undergo a blood transfusion due to significant anemia and tolerated it well.  She is feeling much improved since then.  She also had a port infection and was prescribed Doxycycline x 1 week.  She took all of the antibiotics and was re-evaluated by Dr. Brantley Stage on 03/15/14 and was cleared to proceed with chemotherapy today.  Otherwise, she is doing well and a 10 point ROS is negative.   Medications:  Current Outpatient Prescriptions  Medication Sig Dispense Refill  . dexamethasone (DECADRON) 4 MG tablet Take 2 tablets (8 mg total) by mouth 2 (two) times daily. Start the day before Taxotere. Then again the day after chemo for 3 days.  30 tablet  1  . ferrous sulfate 325 (65 FE) MG EC tablet Take 1 tablet (325 mg total) by mouth 3 (three) times daily with meals.  90 tablet  3  . lidocaine-prilocaine (EMLA) cream Apply topically as needed.  30 g  6  . LORazepam (ATIVAN)  0.5 MG tablet Take 1 tablet (0.5 mg total) by mouth every 6 (six) hours as needed (Nausea or vomiting).  30 tablet  0  . ondansetron (ZOFRAN) 8 MG tablet Take 1 tablet (8 mg total) by mouth 2 (two) times daily. Start the day after chemo for 3 days. Then take as needed for nausea or vomiting.  30 tablet  1  . oxyCODONE-acetaminophen (PERCOCET/ROXICET) 5-325 MG per tablet Take by mouth every 4 (four) hours as needed for severe pain.      Marland Kitchen prochlorperazine (COMPAZINE) 10 MG tablet Take 1 tablet (10 mg  total) by mouth every 6 (six) hours as needed (Nausea or vomiting).  30 tablet  1   No current facility-administered medications for this visit.     Allergies: No Known Allergies  Medical History: Past Medical History  Diagnosis Date  . Endometriosis   . Hypertension   . Rash     fine rash on abd  . Anemia   . Wears glasses   . 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 Iron deficiency anemia, unspecified 02/25/2014    Surgical History:  Past Surgical History  Procedure Laterality Date  . Cervical polypectomy  2010  . Cesarean section      one previous  . Tubal ligation    . Portacath placement Right 02/23/2014    Procedure: INSERTION PORT-A-CATH;  Surgeon: Joyice Faster. Cornett, MD;  Location: [place];  Service: General;  Laterality: Right;     Review of Systems: A 10 point review of systems was conducted and is otherwise negative except for what is noted above.     Physical Exam: Blood pressure 138/79, pulse 67, temperature 98 F (36.7 C), temperature source Oral, resp. rate 18, height 5' 5"  (1.651 m), weight 193 lb 12.8 oz (87.907 kg), last menstrual period 02/19/2014. GENERAL: Patient is a well appearing female in no acute distress HEENT:  Sclerae anicteric.  Oropharynx clear and moist. No ulcerations or evidence of oropharyngeal candidiasis. Neck is supple.  NODES:  No cervical, supraclavicular, or axillary lymphadenopathy palpated.  BREAST EXAM:  Deferred. LUNGS:  Clear to auscultation bilaterally.  No wheezes or rhonchi. HEART:  Regular rate and rhythm. No murmur appreciated. ABDOMEN:  Soft, nontender.  Positive, normoactive bowel sounds. No organomegaly palpated. MSK:  No focal spinal tenderness to palpation. Full range of motion bilaterally in the upper extremities. EXTREMITIES:  No peripheral edema.   SKIN:  Clear with no obvious rashes or skin changes. No nail dyscrasia. NEURO:  Nonfocal. Well oriented.  Appropriate affect. ECOG PERFORMANCE  STATUS: 1 - Symptomatic but completely ambulatory   Lab Results: Lab Results  Component Value Date   WBC 13.3* 03/18/2014   HGB 10.5* 03/18/2014   HCT 33.7* 03/18/2014   MCV 73.8* 03/18/2014   PLT 374 03/18/2014     Chemistry      Component Value Date/Time   NA 140 03/18/2014 1200   NA 139 02/18/2014 0910   K 3.7 03/18/2014 1200   K 4.2 02/18/2014 0910   CL 99 02/18/2014 0910   CO2 25 03/18/2014 1200   CO2 26 02/18/2014 0910   BUN 13.9 03/18/2014 1200   BUN 13 02/18/2014 0910   CREATININE 0.8 03/18/2014 1200   CREATININE 0.87 02/18/2014 0910      Component Value Date/Time   CALCIUM 10.0 03/18/2014 1200   CALCIUM 9.4 02/18/2014 0910   ALKPHOS 77 03/18/2014 1200   AST 15 03/18/2014 1200   ALT 18  03/18/2014 1200   BILITOT 0.35 03/18/2014 1200      Assessment and Plan: Lynnda Wiersma 43 y.o. female with  1. Stage II HER-2/neu positivie invasive ductal carcinoma of the left breast.  She is currently undergoing neoadjuvant chemotherapy with Taxotere, Carboplatin, Herceptin and Perjeta.  Her labs are stable, I will reviewed this with her in detail.  She will proceed with cycle 2 day 1 of chemotherapy today.    2. Recent port infection.  Patient seen by Dr. Barry Dienes following an ED visit on 03/08/14 for port pain.  She had a port infection and was prescribed antibiotics.  She had f/u with Dr. Brantley Stage on 03/15/14 and her port infection had improved and she was cleared to proceed with chemotherapy.   3. Cardiac.  Patient did undergo an echocardiogram on 02/19/14 that demonstrated a LVEF of 45-50%.  She was seen by Dr. Haroldine Laws on 02/19/14 and was cleared to proceed with Herceptin/Perjeta treatment.  A 3 month f/u echocardiogram was recommended.    4. Iron deficiency anemia.  Patient is s/p parenteral iron on 02/26/14 and a blood transfusion on 03/05/14.  The patient will return in one week for labs and evaluation for chemotoxicities.  She knows to call us in the interim for any questions or concerns.  We can  certainly see her sooner if needed.  I spent 25 minutes counseling the patient face to face.  The total time spent in the appointment was 30 minutes.  Minette Headland, [place] [phone] 03/20/2014 9:31 AM

## 2014-03-19 ENCOUNTER — Ambulatory Visit (HOSPITAL_BASED_OUTPATIENT_CLINIC_OR_DEPARTMENT_OTHER): Payer: Medicaid Other

## 2014-03-19 ENCOUNTER — Telehealth: Payer: Self-pay | Admitting: *Deleted

## 2014-03-19 VITALS — BP 134/63 | HR 71 | Temp 98.0°F

## 2014-03-19 DIAGNOSIS — C773 Secondary and unspecified malignant neoplasm of axilla and upper limb lymph nodes: Secondary | ICD-10-CM

## 2014-03-19 DIAGNOSIS — C50419 Malignant neoplasm of upper-outer quadrant of unspecified female breast: Secondary | ICD-10-CM

## 2014-03-19 DIAGNOSIS — Z5189 Encounter for other specified aftercare: Secondary | ICD-10-CM

## 2014-03-19 DIAGNOSIS — C50412 Malignant neoplasm of upper-outer quadrant of left female breast: Secondary | ICD-10-CM

## 2014-03-19 MED ORDER — PEGFILGRASTIM INJECTION 6 MG/0.6ML
6.0000 mg | Freq: Once | SUBCUTANEOUS | Status: AC
  Administered 2014-03-19: 6 mg via SUBCUTANEOUS
  Filled 2014-03-19: qty 0.6

## 2014-03-19 NOTE — Telephone Encounter (Signed)
Message copied by Harmon Pier on Fri Mar 19, 2014  4:28 PM ------      Message from: Minette Headland      Created: Fri Mar 19, 2014  5:56 AM       Please call patient with iron levels.  They are much improved!            LC       ----- Message -----         From: [facility]         Sent: 03/18/2014   1:25 PM           To: Minette Headland, NP                   ------

## 2014-03-19 NOTE — Telephone Encounter (Signed)
Called to inform pt of iron level. Pt was very pleased with her numbers. Pt also informed me she has picked up Rx of ferrous sulfate from pharmacy today. Pt verbalized understanding. Message to be forwarded to Charlestine Massed, NP.

## 2014-03-20 ENCOUNTER — Encounter: Payer: Self-pay | Admitting: Adult Health

## 2014-03-25 ENCOUNTER — Encounter (INDEPENDENT_AMBULATORY_CARE_PROVIDER_SITE_OTHER): Payer: Self-pay | Admitting: General Surgery

## 2014-03-25 ENCOUNTER — Other Ambulatory Visit (HOSPITAL_BASED_OUTPATIENT_CLINIC_OR_DEPARTMENT_OTHER): Payer: Medicaid Other

## 2014-03-25 ENCOUNTER — Telehealth: Payer: Self-pay | Admitting: Oncology

## 2014-03-25 ENCOUNTER — Ambulatory Visit (HOSPITAL_BASED_OUTPATIENT_CLINIC_OR_DEPARTMENT_OTHER): Payer: Medicaid Other | Admitting: Adult Health

## 2014-03-25 ENCOUNTER — Ambulatory Visit (INDEPENDENT_AMBULATORY_CARE_PROVIDER_SITE_OTHER): Payer: Medicaid Other | Admitting: General Surgery

## 2014-03-25 ENCOUNTER — Encounter: Payer: Self-pay | Admitting: Adult Health

## 2014-03-25 VITALS — BP 132/90 | HR 78 | Temp 97.6°F | Resp 14 | Ht 65.0 in | Wt 198.0 lb

## 2014-03-25 VITALS — BP 128/75 | HR 92 | Temp 98.5°F | Resp 18 | Ht 65.0 in | Wt 196.3 lb

## 2014-03-25 DIAGNOSIS — T80219A Unspecified infection due to central venous catheter, initial encounter: Secondary | ICD-10-CM

## 2014-03-25 DIAGNOSIS — T80212A Local infection due to central venous catheter, initial encounter: Secondary | ICD-10-CM

## 2014-03-25 DIAGNOSIS — C50412 Malignant neoplasm of upper-outer quadrant of left female breast: Secondary | ICD-10-CM

## 2014-03-25 DIAGNOSIS — C773 Secondary and unspecified malignant neoplasm of axilla and upper limb lymph nodes: Secondary | ICD-10-CM

## 2014-03-25 DIAGNOSIS — T80218A Other infection due to central venous catheter, initial encounter: Secondary | ICD-10-CM

## 2014-03-25 DIAGNOSIS — C50419 Malignant neoplasm of upper-outer quadrant of unspecified female breast: Secondary | ICD-10-CM

## 2014-03-25 DIAGNOSIS — R110 Nausea: Secondary | ICD-10-CM

## 2014-03-25 LAB — COMPREHENSIVE METABOLIC PANEL (CC13)
ALK PHOS: 98 U/L (ref 40–150)
ALT: 17 U/L (ref 0–55)
AST: 15 U/L (ref 5–34)
Albumin: 3.5 g/dL (ref 3.5–5.0)
Anion Gap: 8 mEq/L (ref 3–11)
BUN: 15.8 mg/dL (ref 7.0–26.0)
CO2: 28 mEq/L (ref 22–29)
Calcium: 9.5 mg/dL (ref 8.4–10.4)
Chloride: 106 mEq/L (ref 98–109)
Creatinine: 0.9 mg/dL (ref 0.6–1.1)
GLUCOSE: 99 mg/dL (ref 70–140)
Potassium: 3.9 mEq/L (ref 3.5–5.1)
Sodium: 142 mEq/L (ref 136–145)
Total Bilirubin: <0.2 mg/dL (ref 0.20–1.20)
Total Protein: 6.4 g/dL (ref 6.4–8.3)

## 2014-03-25 LAB — CBC WITH DIFFERENTIAL/PLATELET
BASO%: 0.3 % (ref 0.0–2.0)
BASOS ABS: 0 10*3/uL (ref 0.0–0.1)
EOS%: 1.1 % (ref 0.0–7.0)
Eosinophils Absolute: 0.1 10*3/uL (ref 0.0–0.5)
HCT: 30.4 % — ABNORMAL LOW (ref 34.8–46.6)
HEMOGLOBIN: 9.6 g/dL — AB (ref 11.6–15.9)
LYMPH%: 21.9 % (ref 14.0–49.7)
MCH: 23.9 pg — ABNORMAL LOW (ref 25.1–34.0)
MCHC: 31.4 g/dL — ABNORMAL LOW (ref 31.5–36.0)
MCV: 75.9 fL — AB (ref 79.5–101.0)
MONO#: 0.6 10*3/uL (ref 0.1–0.9)
MONO%: 5.9 % (ref 0.0–14.0)
NEUT%: 70.8 % (ref 38.4–76.8)
NEUTROS ABS: 7.1 10*3/uL — AB (ref 1.5–6.5)
PLATELETS: 157 10*3/uL (ref 145–400)
RBC: 4.01 10*6/uL (ref 3.70–5.45)
RDW: 34.4 % — ABNORMAL HIGH (ref 11.2–14.5)
WBC: 10 10*3/uL (ref 3.9–10.3)
lymph#: 2.2 10*3/uL (ref 0.9–3.3)

## 2014-03-25 MED ORDER — PROMETHAZINE HCL 25 MG PO TABS: 25.0000 mg | ORAL_TABLET | Freq: Four times a day (QID) | ORAL | Status: DC | PRN

## 2014-03-25 MED ORDER — DOXYCYCLINE HYCLATE 100 MG PO TABS: 100.0000 mg | ORAL_TABLET | Freq: Two times a day (BID) | ORAL | Status: DC

## 2014-03-25 NOTE — Patient Instructions (Signed)
Begin doxycycline today as prescribed

## 2014-03-25 NOTE — Progress Notes (Signed)
Subjective:     Patient ID: Tamara [name], female   DOB: [birth year], 43 y.o.   MRN: [id number]  HPI  She is a patient of Dr. Brantley Stage. She is undergoing chemotherapy for breast cancer. She has had some issues with her Port-A-Cath. She developed some redness after dancing at church. She was prescribed doxycycline today by oncology which he has not filled that yet. She comes to urgent office. Review of Systems     Objective:   Physical Exam Port-A-Cath site has mild erythema and some chronic skin color changes. There is no drainage. No fluctuance is present.    Assessment:     Mild site infection of Port-A-Cath    Plan:     Doxycycline as prescribed. She has an appointment to see Dr. Brantley Stage on May 4. She will followup then and depending on how she is doing, hopefully will be able to resume chemotherapy on May 7.

## 2014-03-25 NOTE — Progress Notes (Signed)
Hematology and Oncology Follow Up Visit  Tamara [name] [id number] 08-May-1971 43 y.o. 03/25/2014 9:46 AM     Principle Diagnosis:Tamara [name] 43 y.o. 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     Prior Therapy:  #1 The patient palpated a left breast mass in the upper outer quadrant. Mammogram showed a 3.2 cm mass in the 2:00 position 7 cm from the nipple. There was also noted to be an 8 mm mass 8 cm from the nipple in the ipsilateral breast. She was also noted to have positive lymph nodes. She had a biopsy of both masses performed as well as the lymph node. 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 MRI confirmed 2.8 and 1.3 cm mass is as well as lymph node. On the right he was noted to have a 6 mm nodule. This has been biopsied and is negative.   #2 staging scans: PET scan  1. Hypermetabolic left breast mass consistent with known neoplasm.  There are also FDG positive left axillary lymph nodes.  2. Benign-appearing brown fat activity and muscular activity in the  neck and chest but no findings for metastatic disease involving the  neck, chest, abdomen or pelvis.  3. No findings for metastatic bone disease.  4. Moderate FDG activity in the endometrial canal is likely due to  secretory phase of ovulation or menses. No mass is seen.  5. Uterine fibroids  CT SCAN:  1. 3 cm left breast mass corresponding to the patient's known  neoplasm. Enlarged left axillary lymph nodes are positive on the PET  scan.  2. No CT findings for metastatic disease involving the chest,  abdomen or pelvis and no evidence of osseous metastatic disease.  3. Mildly enlarged fibroid uterus.   #3 genetic testing: Patient is a BRCA1 mutation carrier   #4 echocardiogram:  02/19/2014: Left ventricular ejection  fraction 45-50%. Patient was seen by cardiology and they have cleared her for chemotherapy with anti-HER-2-based regimen   #5 neoadjuvant chemotherapy: Patient recommended Taxotere carboplatinum Herceptin and perjeta every 3 weeks for a total of 6 cycles. She started this therapy on 02/25/14   Current therapy:  Neoadjuvant Taxotere, Carboplatin, Herceptin, Perjeta cycle 2 day 8   Interim History: Tamara [name] 43 y.o. female with stage II, HER-2/neu positive, invasive ductal carcinoma of the left breast.  She is here for evaluation after receiving cycle 2 of 6 planned treatments of Taxotere, Carboplatin, Herceptin, and Perjeta.  She receives this treatment on day 1 of a 21 day cycle with Neulasta support given on day 2.    She is cycle 2 day 8 of treatment.  She is feeling nauseated today.  This has been going on for one week.  She is taking Zofran and Compazine and continues to feel queezy.  She wants to know if there is anything else she can take.  She was at church and dancing, and noticed that her port became red, tender, warm, and swollen.  She tells me that it is not as bad as it previously was.  She denies fever, chills, vomiting, constipation, diarrhea, numbness/tingling, nail changes, skin changes, mouth pain/ulcerations, or any further concerns.    Medications:  Current Outpatient Prescriptions  Medication Sig Dispense Refill  . ferrous sulfate 325 (65 FE) MG EC tablet Take 1 tablet (325 mg total) by mouth 3 (three)  times daily with meals.  90 tablet  3  . lidocaine-prilocaine (EMLA) cream Apply topically as needed.  30 g  6  . LORazepam (ATIVAN) 0.5 MG tablet Take 1 tablet (0.5 mg total) by mouth every 6 (six) hours as needed (Nausea or vomiting).  30 tablet  0  . ondansetron (ZOFRAN) 8 MG tablet Take 1 tablet (8 mg total) by mouth 2 (two) times daily. Start the day after chemo for 3 days. Then take as needed for nausea or vomiting.  30 tablet  1  . oxyCODONE-acetaminophen  (PERCOCET/ROXICET) 5-325 MG per tablet Take by mouth every 4 (four) hours as needed for severe pain.      Marland Kitchen prochlorperazine (COMPAZINE) 10 MG tablet Take 1 tablet (10 mg total) by mouth every 6 (six) hours as needed (Nausea or vomiting).  30 tablet  1  . dexamethasone (DECADRON) 4 MG tablet Take 2 tablets (8 mg total) by mouth 2 (two) times daily. Start the day before Taxotere. Then again the day after chemo for 3 days.  30 tablet  1   No current facility-administered medications for this visit.     Allergies: No Known Allergies  Medical History: Past Medical History  Diagnosis Date  . Endometriosis   . Hypertension   . Rash     fine rash on abd  . Anemia   . Wears glasses   . 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 Iron deficiency anemia, unspecified 02/25/2014    Surgical History:  Past Surgical History  Procedure Laterality Date  . Cervical polypectomy  2010  . Cesarean section      one previous  . Tubal ligation    . Portacath placement Right 02/23/2014    Procedure: INSERTION PORT-A-CATH;  Surgeon: Joyice Faster. Cornett, MD;  Location: [place];  Service: General;  Laterality: Right;     Review of Systems: A 10 point review of systems was conducted and is otherwise negative except for what is noted above.     Physical Exam: Blood pressure 128/75, pulse 92, temperature 98.5 F (36.9 C), temperature source Oral, resp. rate 18, height _0  (1.651 m), weight 196 lb 4.8 oz (89.041 kg). GENERAL: Patient is a well appearing female in no acute distress HEENT:  Sclerae anicteric.  Oropharynx clear and moist. No ulcerations or evidence of oropharyngeal candidiasis. Neck is supple.  NODES:  No cervical, supraclavicular, or axillary lymphadenopathy palpated.  BREAST EXAM:  Left breast mass about 2cm, soft LUNGS:  Clear to auscultation bilaterally.  No wheezes or rhonchi. HEART:  Regular rate and rhythm. No murmur appreciated. ABDOMEN:  Soft, nontender.   Positive, normoactive bowel sounds. No organomegaly palpated. MSK:  No focal spinal tenderness to palpation. Full range of motion bilaterally in the upper extremities. EXTREMITIES:  No peripheral edema.   SKIN:  Clear with no obvious rashes or skin changes. No nail dyscrasia. Right chest wall port is erythematous, tender, warm and slightly swollen.   NEURO:  Nonfocal. Well oriented.  Appropriate affect. ECOG PERFORMANCE STATUS: 1 - Symptomatic but completely ambulatory   Lab Results: Lab Results  Component Value Date   WBC 10.0 03/25/2014   HGB 9.6* 03/25/2014   HCT 30.4* 03/25/2014   MCV 75.9* 03/25/2014   PLT 157 03/25/2014     Chemistry      Component Value Date/Time   NA 140 03/18/2014 1200   NA 139 02/18/2014 0910   K 3.7 03/18/2014 1200   K 4.2 02/18/2014  0910   CL 99 02/18/2014 0910   CO2 25 03/18/2014 1200   CO2 26 02/18/2014 0910   BUN 13.9 03/18/2014 1200   BUN 13 02/18/2014 0910   CREATININE 0.8 03/18/2014 1200   CREATININE 0.87 02/18/2014 0910      Component Value Date/Time   CALCIUM 10.0 03/18/2014 1200   CALCIUM 9.4 02/18/2014 0910   ALKPHOS 77 03/18/2014 1200   AST 15 03/18/2014 1200   ALT 18 03/18/2014 1200   BILITOT 0.35 03/18/2014 1200      Assessment and Plan: Tamara [name] 43 y.o. female with  1. Stage II HER-2/neu positive invasive ductal carcinoma of the left breast.  Please see prior history above.  Patient has received her second cycle of Taxotere, Carboplatin, Herceptin, Perjeta.  She tolerated it moderately well.  Her labs are stable.  I reviewed them with her in detail.    2. Nausea:  The patient has been nauseated.  She has not been taking Zofran if needed since the third day following treatment, she has only been taking Compazine PRN.  I recommended she take Zofran every 8 hours PRN. I also prescribed Phenergan PRN for her nausea.  3. Port infection. [name] port appears infected again.  I prescribed Doxycycline BID and we called [facility] and got her an  appointment for evaluation by surgery today.    The patient will return in 2 weeks for labs, evaluation, and cycle 3 of Taxotere, Carboplatin, Herceptin, and Perjeta.  She knows to call us in the interim for any questions or concerns.  We can certainly see her sooner if needed.  I spent 25 minutes counseling the patient face to face.  The total time spent in the appointment was 30 minutes.  Minette Headland, [place] [phone] 03/25/2014 9:46 AM

## 2014-03-25 NOTE — Patient Instructions (Signed)
Doxycycline tablets or capsules What is this medicine? DOXYCYCLINE (dox i SYE kleen) is a tetracycline antibiotic. It kills certain bacteria or stops their growth. It is used to treat many kinds of infections, like dental, skin, respiratory, and urinary tract infections. It also treats acne, Lyme disease, malaria, and certain sexually transmitted infections. This medicine may be used for other purposes; ask your health care provider or pharmacist if you have questions. COMMON BRAND NAME(S): Adoxa CK, Adoxa Pak, Adoxa TT, Adoxa, Alodox, Avidoxy, Doxal, Monodox, Morgidox 1x Kit, Morgidox 1x, Morgidox 2x , Morgidox 2x Kit, Ocudox , Vibra-Tabs, Vibramycin What should I tell my health care provider before I take this medicine? They need to know if you have any of these conditions: -liver disease -long exposure to sunlight like working outdoors -stomach problems like colitis -an unusual or allergic reaction to doxycycline, tetracycline antibiotics, other medicines, foods, dyes, or preservatives -pregnant or trying to get pregnant -breast-feeding How should I use this medicine? Take this medicine by mouth with a full glass of water. Follow the directions on the prescription label. It is best to take this medicine without food, but if it upsets your stomach take it with food. Take your medicine at regular intervals. Do not take your medicine more often than directed. Take all of your medicine as directed even if you think you are better. Do not skip doses or stop your medicine early. Talk to your pediatrician regarding the use of this medicine in children. Special care may be needed. While this drug may be prescribed for children as young as 37 years old for selected conditions, precautions do apply. Overdosage: If you think you have taken too much of this medicine contact a poison control center or emergency room at once. NOTE: This medicine is only for you. Do not share this medicine with others. What if  I miss a dose? If you miss a dose, take it as soon as you can. If it is almost time for your next dose, take only that dose. Do not take double or extra doses. What may interact with this medicine? -antacids -barbiturates -birth control pills -bismuth subsalicylate -carbamazepine -methoxyflurane -other antibiotics -phenytoin -vitamins that contain iron -warfarin This list may not describe all possible interactions. Give your health care provider a list of all the medicines, herbs, non-prescription drugs, or dietary supplements you use. Also tell them if you smoke, drink alcohol, or use illegal drugs. Some items may interact with your medicine. What should I watch for while using this medicine? Tell your doctor or health care professional if your symptoms do not improve. Do not treat diarrhea with over the counter products. Contact your doctor if you have diarrhea that lasts more than 2 days or if it is severe and watery. Do not take this medicine just before going to bed. It may not dissolve properly when you lay down and can cause pain in your throat. Drink plenty of fluids while taking this medicine to also help reduce irritation in your throat. This medicine can make you more sensitive to the sun. Keep out of the sun. If you cannot avoid being in the sun, wear protective clothing and use sunscreen. Do not use sun lamps or tanning beds/booths. Birth control pills may not work properly while you are taking this medicine. Talk to your doctor about using an extra method of birth control. If you are being treated for a sexually transmitted infection, avoid sexual contact until you have finished your treatment. Your sexual partner  may also need treatment. Avoid antacids, aluminum, calcium, magnesium, and iron products for 4 hours before and 2 hours after taking a dose of this medicine. If you are using this medicine to prevent malaria, you should still protect yourself from contact with mosquitos.  Stay in screened-in areas, use mosquito nets, keep your body covered, and use an insect repellent. What side effects may I notice from receiving this medicine? Side effects that you should report to your doctor or health care professional as soon as possible: -allergic reactions like skin rash, itching or hives, swelling of the face, lips, or tongue -difficulty breathing -fever -itching in the rectal or genital area -pain on swallowing -redness, blistering, peeling or loosening of the skin, including inside the mouth -severe stomach pain or cramps -unusual bleeding or bruising -unusually weak or tired -yellowing of the eyes or skin Side effects that usually do not require medical attention (report to your doctor or health care professional if they continue or are bothersome): -diarrhea -loss of appetite -nausea, vomiting This list may not describe all possible side effects. Call your doctor for medical advice about side effects. You may report side effects to FDA at 1-800-FDA-1088. Where should I keep my medicine? Keep out of the reach of children. Store at room temperature, below 30 degrees C (86 degrees F). Protect from light. Keep container tightly closed. Throw away any unused medicine after the expiration date. Taking this medicine after the expiration date can make you seriously ill. NOTE: This sheet is a summary. It may not cover all possible information. If you have questions about this medicine, talk to your doctor, pharmacist, or health care provider.  2014, Elsevier/Gold Standard. (2008-03-09 16:53:02) Promethazine tablets What is this medicine? PROMETHAZINE (proe METH a zeen) is an antihistamine. It is used to treat allergic reactions and to treat or prevent nausea and vomiting from illness or motion sickness. It is also used to make you sleep before surgery, and to help treat pain or nausea after surgery. This medicine may be used for other purposes; ask your health care  provider or pharmacist if you have questions. COMMON BRAND NAME(S): Phenergan What should I tell my health care provider before I take this medicine? They need to know if you have any of these conditions: -glaucoma -high blood pressure or heart disease -kidney disease -liver disease -lung or breathing disease, like asthma -prostate trouble -pain or difficulty passing urine -seizures -an unusual or allergic reaction to promethazine or phenothiazines, other medicines, foods, dyes, or preservatives -pregnant or trying to get pregnant -breast-feeding How should I use this medicine? Take this medicine by mouth with a glass of water. Follow the directions on the prescription label. Take your doses at regular intervals. Do not take your medicine more often than directed. Talk to your pediatrician regarding the use of this medicine in children. Special care may be needed. This medicine should not be given to infants and children younger than 9 years old. Overdosage: If you think you have taken too much of this medicine contact a poison control center or emergency room at once. NOTE: This medicine is only for you. Do not share this medicine with others. What if I miss a dose? If you miss a dose, take it as soon as you can. If it is almost time for your next dose, take only that dose. Do not take double or extra doses. What may interact with this medicine? Do not take this medicine with any of the following medications: -cisapride -  dofetilide -dronedarone -MAOIs like Carbex, Eldepryl, Marplan, Nardil, Parnate -pimozide -quinidine, including dextromethorphan; quinidine -thioridazine -ziprasidone  This medicine may also interact with the following medications: -certain medicines for depression, anxiety, or psychotic disturbances -certain medicines for anxiety or sleep -certain medicines for seizures like carbamazepine, phenobarbital, phenytoin -certain medicines for movement abnormalities as  in Parkinson's disease, or for gastrointestinal problems -epinephrine -medicines for allergies or colds -muscle relaxants -narcotic medicines for pain -other medicines that prolong the QT interval (cause an abnormal heart rhythm) -tramadol -trimethobenzamide This list may not describe all possible interactions. Give your health care provider a list of all the medicines, herbs, non-prescription drugs, or dietary supplements you use. Also tell them if you smoke, drink alcohol, or use illegal drugs. Some items may interact with your medicine. What should I watch for while using this medicine? Tell your doctor or health care professional if your symptoms do not start to get better in 1 to 2 days. You may get drowsy or dizzy. Do not drive, use machinery, or do anything that needs mental alertness until you know how this medicine affects you. To reduce the risk of dizzy or fainting spells, do not stand or sit up quickly, especially if you are an older patient. Alcohol may increase dizziness and drowsiness. Avoid alcoholic drinks. Your mouth may get dry. Chewing sugarless gum or sucking hard candy, and drinking plenty of water may help. Contact your doctor if the problem does not go away or is severe. This medicine may cause dry eyes and blurred vision. If you wear contact lenses you may feel some discomfort. Lubricating drops may help. See your eye doctor if the problem does not go away or is severe. This medicine can make you more sensitive to the sun. Keep out of the sun. If you cannot avoid being in the sun, wear protective clothing and use sunscreen. Do not use sun lamps or tanning beds/booths. If you are diabetic, check your blood-sugar levels regularly. What side effects may I notice from receiving this medicine? Side effects that you should report to your doctor or health care professional as soon as possible: -blurred vision -irregular heartbeat, palpitations or chest pain -muscle or facial  twitches -pain or difficulty passing urine -seizures -skin rash -slowed or shallow breathing -unusual bleeding or bruising -yellowing of the eyes or skin Side effects that usually do not require medical attention (report to your doctor or health care professional if they continue or are bothersome): -headache -nightmares, agitation, nervousness, excitability, not able to sleep (these are more likely in children) -stuffy nose This list may not describe all possible side effects. Call your doctor for medical advice about side effects. You may report side effects to FDA at 1-800-FDA-1088. Where should I keep my medicine? Keep out of the reach of children. Store at room temperature, between 20 and 25 degrees C (68 and 77 degrees F). Protect from light. Throw away any unused medicine after the expiration date. NOTE: This sheet is a summary. It may not cover all possible information. If you have questions about this medicine, talk to your doctor, pharmacist, or health care provider.  2014, Elsevier/Gold Standard. (2013-07-21 15:04:46) Dehydration, Adult Dehydration is when you lose more fluids from the body than you take in. Vital organs like the kidneys, brain, and heart cannot function without a proper amount of fluids and salt. Any loss of fluids from the body can cause dehydration.  CAUSES   Vomiting.  Diarrhea.  Excessive sweating.  Excessive urine output.  Fever. SYMPTOMS  Mild dehydration  Thirst.  Dry lips.  Slightly dry mouth. Moderate dehydration  Very dry mouth.  Sunken eyes.  Skin does not bounce back quickly when lightly pinched and released.  Dark urine and decreased urine production.  Decreased tear production.  Headache. Severe dehydration  Very dry mouth.  Extreme thirst.  Rapid, weak pulse (more than 100 beats per minute at rest).  Cold hands and feet.  Not able to sweat in spite of heat and temperature.  Rapid breathing.  Blue  lips.  Confusion and lethargy.  Difficulty being awakened.  Minimal urine production.  No tears. DIAGNOSIS  Your caregiver will diagnose dehydration based on your symptoms and your exam. Blood and urine tests will help confirm the diagnosis. The diagnostic evaluation should also identify the cause of dehydration. TREATMENT  Treatment of mild or moderate dehydration can often be done at home by increasing the amount of fluids that you drink. It is best to drink small amounts of fluid more often. Drinking too much at one time can make vomiting worse. Refer to the home care instructions below. Severe dehydration needs to be treated at the hospital where you will probably be given intravenous (IV) fluids that contain water and electrolytes. HOME CARE INSTRUCTIONS   Ask your caregiver about specific rehydration instructions.  Drink enough fluids to keep your urine clear or pale yellow.  Drink small amounts frequently if you have nausea and vomiting.  Eat as you normally do.  Avoid:  Foods or drinks high in sugar.  Carbonated drinks.  Juice.  Extremely hot or cold fluids.  Drinks with caffeine.  Fatty, greasy foods.  Alcohol.  Tobacco.  Overeating.  Gelatin desserts.  Wash your hands well to avoid spreading bacteria and viruses.  Only take over-the-counter or prescription medicines for pain, discomfort, or fever as directed by your caregiver.  Ask your caregiver if you should continue all prescribed and over-the-counter medicines.  Keep all follow-up appointments with your caregiver. SEEK MEDICAL CARE IF:  You have abdominal pain and it increases or stays in one area (localizes).  You have a rash, stiff neck, or severe headache.  You are irritable, sleepy, or difficult to awaken.  You are weak, dizzy, or extremely thirsty. SEEK IMMEDIATE MEDICAL CARE IF:   You are unable to keep fluids down or you get worse despite treatment.  You have frequent episodes of  vomiting or diarrhea.  You have blood or green matter (bile) in your vomit.  You have blood in your stool or your stool looks black and tarry.  You have not urinated in 6 to 8 hours, or you have only urinated a small amount of very dark urine.  You have a fever.  You faint. MAKE SURE YOU:   Understand these instructions.  Will watch your condition.  Will get help right away if you are not doing well or get worse. Document Released: 11/19/2005 Document Revised: 02/11/2012 Document Reviewed: 07/09/2011 [facility] Patient Information 2014 [place], [place].

## 2014-03-25 NOTE — Telephone Encounter (Signed)
gv pt appt schedule for may thru july °

## 2014-04-05 ENCOUNTER — Encounter (INDEPENDENT_AMBULATORY_CARE_PROVIDER_SITE_OTHER): Payer: Medicaid Other | Admitting: General Surgery

## 2014-04-05 ENCOUNTER — Encounter (INDEPENDENT_AMBULATORY_CARE_PROVIDER_SITE_OTHER): Payer: Self-pay | Admitting: Surgery

## 2014-04-05 ENCOUNTER — Ambulatory Visit (INDEPENDENT_AMBULATORY_CARE_PROVIDER_SITE_OTHER): Payer: Medicaid Other | Admitting: Surgery

## 2014-04-05 VITALS — BP 126/80 | HR 80 | Temp 98.4°F | Ht 65.0 in | Wt 187.0 lb

## 2014-04-05 DIAGNOSIS — T80212A Local infection due to central venous catheter, initial encounter: Secondary | ICD-10-CM

## 2014-04-05 MED ORDER — DOXYCYCLINE HYCLATE 50 MG PO CAPS: 100.0000 mg | ORAL_CAPSULE | Freq: Two times a day (BID) | ORAL | Status: DC

## 2014-04-05 NOTE — Progress Notes (Signed)
Patient returns after Port-A-Cath placement weeks ago. She was  seen last week due to  redness and swelling around the port site. Shee has improved on antibiotics. Her pain is much better.  Exam: Port site intact with minimal separation medial incision of the millimeter. No redness or hematoma. No redness along catheter site. Wound is healing slowly.   Impression: Stage II breast cancer status post insertion of Port-A-Cath superficial infection treated with antibiotics   Plan: Improved. Port not ready  For use but should be in 3 weeks.  May take treatment peripherally this week and I will recheck in 2 weeks.  Keep her on abx for 2 more weeks.

## 2014-04-05 NOTE — Patient Instructions (Signed)
Take doxycycline for 2 more weeks.  Return 2 weeks.  May be able to use in three weeks but should use vein in arm this week.

## 2014-04-06 ENCOUNTER — Encounter: Payer: Self-pay | Admitting: *Deleted

## 2014-04-08 ENCOUNTER — Other Ambulatory Visit (HOSPITAL_BASED_OUTPATIENT_CLINIC_OR_DEPARTMENT_OTHER): Payer: Medicaid Other

## 2014-04-08 ENCOUNTER — Ambulatory Visit (HOSPITAL_BASED_OUTPATIENT_CLINIC_OR_DEPARTMENT_OTHER): Payer: Medicaid Other

## 2014-04-08 ENCOUNTER — Other Ambulatory Visit: Payer: Self-pay | Admitting: Oncology

## 2014-04-08 ENCOUNTER — Ambulatory Visit (HOSPITAL_BASED_OUTPATIENT_CLINIC_OR_DEPARTMENT_OTHER): Payer: Medicaid Other | Admitting: Adult Health

## 2014-04-08 ENCOUNTER — Encounter: Payer: Self-pay | Admitting: Adult Health

## 2014-04-08 VITALS — BP 143/88 | HR 88 | Temp 98.8°F | Resp 18 | Ht 65.0 in | Wt 186.1 lb

## 2014-04-08 DIAGNOSIS — C50419 Malignant neoplasm of upper-outer quadrant of unspecified female breast: Secondary | ICD-10-CM

## 2014-04-08 DIAGNOSIS — C773 Secondary and unspecified malignant neoplasm of axilla and upper limb lymph nodes: Secondary | ICD-10-CM

## 2014-04-08 DIAGNOSIS — C50412 Malignant neoplasm of upper-outer quadrant of left female breast: Secondary | ICD-10-CM

## 2014-04-08 DIAGNOSIS — R110 Nausea: Secondary | ICD-10-CM

## 2014-04-08 DIAGNOSIS — Z5112 Encounter for antineoplastic immunotherapy: Secondary | ICD-10-CM

## 2014-04-08 DIAGNOSIS — Z5111 Encounter for antineoplastic chemotherapy: Secondary | ICD-10-CM

## 2014-04-08 LAB — COMPREHENSIVE METABOLIC PANEL (CC13)
ALT: 20 U/L (ref 0–55)
ANION GAP: 11 meq/L (ref 3–11)
AST: 13 U/L (ref 5–34)
Albumin: 3.4 g/dL — ABNORMAL LOW (ref 3.5–5.0)
Alkaline Phosphatase: 80 U/L (ref 40–150)
BUN: 15 mg/dL (ref 7.0–26.0)
CALCIUM: 10.3 mg/dL (ref 8.4–10.4)
CHLORIDE: 110 meq/L — AB (ref 98–109)
CO2: 21 mEq/L — ABNORMAL LOW (ref 22–29)
Creatinine: 0.9 mg/dL (ref 0.6–1.1)
Glucose: 129 mg/dl (ref 70–140)
Potassium: 3.9 mEq/L (ref 3.5–5.1)
SODIUM: 143 meq/L (ref 136–145)
TOTAL PROTEIN: 7.6 g/dL (ref 6.4–8.3)
Total Bilirubin: 0.21 mg/dL (ref 0.20–1.20)

## 2014-04-08 LAB — CBC WITH DIFFERENTIAL/PLATELET
BASO%: 0 % (ref 0.0–2.0)
Basophils Absolute: 0 10*3/uL (ref 0.0–0.1)
EOS%: 0 % (ref 0.0–7.0)
Eosinophils Absolute: 0 10*3/uL (ref 0.0–0.5)
HCT: 29.8 % — ABNORMAL LOW (ref 34.8–46.6)
HEMOGLOBIN: 9.4 g/dL — AB (ref 11.6–15.9)
LYMPH#: 1.4 10*3/uL (ref 0.9–3.3)
LYMPH%: 22.5 % (ref 14.0–49.7)
MCH: 25.2 pg (ref 25.1–34.0)
MCHC: 31.5 g/dL (ref 31.5–36.0)
MCV: 79.9 fL (ref 79.5–101.0)
MONO#: 0.3 10*3/uL (ref 0.1–0.9)
MONO%: 5.6 % (ref 0.0–14.0)
NEUT#: 4.4 10*3/uL (ref 1.5–6.5)
NEUT%: 71.9 % (ref 38.4–76.8)
Platelets: 407 10*3/uL — ABNORMAL HIGH (ref 145–400)
RBC: 3.73 10*6/uL (ref 3.70–5.45)
WBC: 6.1 10*3/uL (ref 3.9–10.3)

## 2014-04-08 MED ORDER — DOCETAXEL CHEMO INJECTION 160 MG/16ML
75.0000 mg/m2 | Freq: Once | INTRAVENOUS | Status: AC
  Administered 2014-04-08: 150 mg via INTRAVENOUS
  Filled 2014-04-08: qty 15

## 2014-04-08 MED ORDER — SODIUM CHLORIDE 0.9 % IV SOLN
150.0000 mg | Freq: Once | INTRAVENOUS | Status: AC
  Administered 2014-04-08: 150 mg via INTRAVENOUS
  Filled 2014-04-08: qty 5

## 2014-04-08 MED ORDER — SODIUM CHLORIDE 0.9 % IV SOLN
6.0000 mg/kg | Freq: Once | INTRAVENOUS | Status: AC
  Administered 2014-04-08: 525 mg via INTRAVENOUS
  Filled 2014-04-08: qty 25

## 2014-04-08 MED ORDER — DEXAMETHASONE SODIUM PHOSPHATE 20 MG/5ML IJ SOLN
12.0000 mg | Freq: Once | INTRAMUSCULAR | Status: AC
  Administered 2014-04-08: 12 mg via INTRAVENOUS

## 2014-04-08 MED ORDER — PALONOSETRON HCL INJECTION 0.25 MG/5ML
INTRAVENOUS | Status: AC
  Filled 2014-04-08: qty 5

## 2014-04-08 MED ORDER — ACETAMINOPHEN 325 MG PO TABS
650.0000 mg | ORAL_TABLET | Freq: Once | ORAL | Status: AC
  Administered 2014-04-08: 650 mg via ORAL

## 2014-04-08 MED ORDER — SODIUM CHLORIDE 0.9 % IV SOLN
420.0000 mg | Freq: Once | INTRAVENOUS | Status: AC
  Administered 2014-04-08: 420 mg via INTRAVENOUS
  Filled 2014-04-08: qty 14

## 2014-04-08 MED ORDER — DEXAMETHASONE SODIUM PHOSPHATE 20 MG/5ML IJ SOLN
INTRAMUSCULAR | Status: AC
  Filled 2014-04-08: qty 5

## 2014-04-08 MED ORDER — SODIUM CHLORIDE 0.9 % IV SOLN
Freq: Once | INTRAVENOUS | Status: AC
  Administered 2014-04-08: 11:00:00 via INTRAVENOUS

## 2014-04-08 MED ORDER — PALONOSETRON HCL INJECTION 0.25 MG/5ML
0.2500 mg | Freq: Once | INTRAVENOUS | Status: AC
  Administered 2014-04-08: 0.25 mg via INTRAVENOUS

## 2014-04-08 MED ORDER — DIPHENHYDRAMINE HCL 25 MG PO CAPS
ORAL_CAPSULE | ORAL | Status: AC
  Filled 2014-04-08: qty 2

## 2014-04-08 MED ORDER — ACETAMINOPHEN 325 MG PO TABS
ORAL_TABLET | ORAL | Status: AC
  Filled 2014-04-08: qty 2

## 2014-04-08 MED ORDER — SODIUM CHLORIDE 0.9 % IV SOLN
843.6000 mg | Freq: Once | INTRAVENOUS | Status: AC
  Administered 2014-04-08: 840 mg via INTRAVENOUS
  Filled 2014-04-08: qty 84

## 2014-04-08 MED ORDER — DIPHENHYDRAMINE HCL 25 MG PO CAPS
50.0000 mg | ORAL_CAPSULE | Freq: Once | ORAL | Status: AC
  Administered 2014-04-08: 50 mg via ORAL

## 2014-04-08 NOTE — Progress Notes (Signed)
Hematology and Oncology Follow Up Visit  [name] [id number] 01-Dec-1971 43 y.o. 04/08/2014 10:06 AM     Principle Diagnosis:Tamara [name] 43 y.o. 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     Prior Therapy:  #1 The patient palpated a left breast mass in the upper outer quadrant. Mammogram showed a 3.2 cm mass in the 2:00 position 7 cm from the nipple. There was also noted to be an 8 mm mass 8 cm from the nipple in the ipsilateral breast. She was also noted to have positive lymph nodes. She had a biopsy of both masses performed as well as the lymph node. 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 MRI confirmed 2.8 and 1.3 cm mass is as well as lymph node. On the right he was noted to have a 6 mm nodule. This has been biopsied and is negative.   #2 staging scans: PET scan  1. Hypermetabolic left breast mass consistent with known neoplasm.  There are also FDG positive left axillary lymph nodes.  2. Benign-appearing brown fat activity and muscular activity in the  neck and chest but no findings for metastatic disease involving the  neck, chest, abdomen or pelvis.  3. No findings for metastatic bone disease.  4. Moderate FDG activity in the endometrial canal is likely due to  secretory phase of ovulation or menses. No mass is seen.  5. Uterine fibroids  CT SCAN:  1. 3 cm left breast mass corresponding to the patient's known  neoplasm. Enlarged left axillary lymph nodes are positive on the PET  scan.  2. No CT findings for metastatic disease involving the chest,  abdomen or pelvis and no evidence of osseous metastatic disease.  3. Mildly enlarged fibroid uterus.   #3 genetic testing: Patient is a BRCA1 mutation carrier   #4 echocardiogram:  02/19/2014: Left ventricular ejection  fraction 45-50%. Patient was seen by cardiology and they have cleared her for chemotherapy with anti-HER-2-based regimen   #5 neoadjuvant chemotherapy: Patient recommended Taxotere carboplatinum Herceptin and perjeta every 3 weeks for a total of 6 cycles. She started this therapy on 02/25/14.   Current therapy:  Neoadjuvant Taxotere, Carboplatin, Herceptin, Perjeta cycle 3 day 1   Interim History: Tamara [name] 43 y.o. female with stage II, HER-2/neu positive, invasive ductal carcinoma of the left breast.  She is here for evaluation prior to receiving cycle 3 of 6 planned treatments of Taxotere, Carboplatin, Herceptin, and Perjeta.  She receives this treatment on day 1 of a 21 day cycle with Neulasta support given on day 2.    She is cycle 3 day 1 of treatment.  She was seen last a couple of weeks ago and was having difficulty with her port separating.  She was evaluated by Dr. Brantley Stage who prescribed Doxycycline and recommended she not use the port yet.  She will f/u with Dr. Brantley Stage on May, 26.  She is doing well today.  She did have the experience of feeling very hot and didn't have a thermometer.  She had a low grade fever of 99.9 and had caught a cold from her son.  She does have some nausea following treatment and it is so severe that she has difficulty doing activities she enjoys.  She is requesting a change in her nausea medication.  The phenergan she took after her last  appointment has helped.  She is improving and only has mild sniffles since then.  Otherwise she denies fevers, chills, nausea, vomiting, constipation, diarrhea, numbness/tingling, nail changes, mucositis or any further concerns.     Medications:  Current Outpatient Prescriptions  Medication Sig Dispense Refill  . dexamethasone (DECADRON) 4 MG tablet Take 2 tablets (8 mg total) by mouth 2 (two) times daily. Start the day before Taxotere. Then again the day after chemo for 3 days.  30 tablet  1  . doxycycline (VIBRAMYCIN) 50 MG  capsule Take 2 capsules (100 mg total) by mouth 2 (two) times daily.  56 capsule  0  . ferrous sulfate 325 (65 FE) MG EC tablet Take 1 tablet (325 mg total) by mouth 3 (three) times daily with meals.  90 tablet  3  . lidocaine-prilocaine (EMLA) cream Apply topically as needed.  30 g  6  . LORazepam (ATIVAN) 0.5 MG tablet Take 1 tablet (0.5 mg total) by mouth every 6 (six) hours as needed (Nausea or vomiting).  30 tablet  0  . ondansetron (ZOFRAN) 8 MG tablet Take 1 tablet (8 mg total) by mouth 2 (two) times daily. Start the day after chemo for 3 days. Then take as needed for nausea or vomiting.  30 tablet  1  . oxyCODONE-acetaminophen (PERCOCET/ROXICET) 5-325 MG per tablet Take by mouth every 4 (four) hours as needed for severe pain.      Marland Kitchen prochlorperazine (COMPAZINE) 10 MG tablet Take 1 tablet (10 mg total) by mouth every 6 (six) hours as needed (Nausea or vomiting).  30 tablet  1  . promethazine (PHENERGAN) 25 MG tablet Take 1 tablet (25 mg total) by mouth every 6 (six) hours as needed for nausea or vomiting.  30 tablet  0   No current facility-administered medications for this visit.     Allergies: No Known Allergies  Medical History: Past Medical History  Diagnosis Date  . Endometriosis   . Hypertension   . Rash     fine rash on abd  . Anemia   . Wears glasses   . 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 Iron deficiency anemia, unspecified 02/25/2014    Surgical History:  Past Surgical History  Procedure Laterality Date  . Cervical polypectomy  2010  . Cesarean section      one previous  . Tubal ligation    . Portacath placement Right 02/23/2014    Procedure: INSERTION PORT-A-CATH;  Surgeon: Joyice Faster. Cornett, MD;  Location: [facility];  Service: General;  Laterality: Right;     Review of Systems: A 10 point review of systems was conducted and is otherwise negative except for what is noted above.     Physical Exam: Blood pressure 143/88, pulse 88,  temperature 98.8 F (37.1 C), temperature source Oral, resp. rate 18, height 5' 5"  (1.651 m), weight 186 lb 1.6 oz (84.414 kg). GENERAL: Patient is a well appearing female in no acute distress HEENT:  Sclerae anicteric.  Oropharynx clear and moist. No ulcerations or evidence of oropharyngeal candidiasis. Neck is supple.  NODES:  No cervical, supraclavicular, or axillary lymphadenopathy palpated.  BREAST EXAM:  Left breast mass about 1cm, soft LUNGS:  Clear to auscultation bilaterally.  No wheezes or rhonchi. HEART:  Regular rate and rhythm. No murmur appreciated. ABDOMEN:  Soft, nontender.  Positive, normoactive bowel sounds. No organomegaly palpated. MSK:  No focal spinal tenderness to palpation. Full range of motion bilaterally in the upper extremities. EXTREMITIES:  No peripheral edema.   SKIN:  Clear with no obvious rashes or skin changes. No nail dyscrasia. Right chest wall port is erythematous, tender, warm and slightly swollen.   NEURO:  Nonfocal. Well oriented.  Appropriate affect. ECOG PERFORMANCE STATUS: 1 - Symptomatic but completely ambulatory   Lab Results: Lab Results  Component Value Date   WBC 6.1 04/08/2014   HGB 9.4* 04/08/2014   HCT 29.8* 04/08/2014   MCV 79.9 04/08/2014   PLT 407* 04/08/2014     Chemistry      Component Value Date/Time   NA 143 04/08/2014 0846   NA 139 02/18/2014 0910   K 3.9 04/08/2014 0846   K 4.2 02/18/2014 0910   CL 99 02/18/2014 0910   CO2 21* 04/08/2014 0846   CO2 26 02/18/2014 0910   BUN 15.0 04/08/2014 0846   BUN 13 02/18/2014 0910   CREATININE 0.9 04/08/2014 0846   CREATININE 0.87 02/18/2014 0910      Component Value Date/Time   CALCIUM 10.3 04/08/2014 0846   CALCIUM 9.4 02/18/2014 0910   ALKPHOS 80 04/08/2014 0846   AST 13 04/08/2014 0846   ALT 20 04/08/2014 0846   BILITOT 0.21 04/08/2014 0846      Assessment and Plan: [name] 43 y.o. female with  1. Stage II HER-2/neu positive invasive ductal carcinoma of the left breast.  Please see prior history  above.  Her labs are stable today.  I reviewed them with her in detail.  She will proceed with chemotherapy today.    2. Nausea:  Due to the severity and impact on her quality of life, I ordered Fosaprepitant and Aloxi as premedications for her chemotherapy regimen today.    3. Port infection. [name] port skin separation is being managed by Dr. Brantley Stage.  She is taking Doxycycline BID per his direction and he has recommended we not use the port for the time being.  She has f/u with him on 04/27/14.    The patient will return tomorrow for Neulasta and in one week for labs and evaluation of chemotoxicities.  She knows to call us in the interim for any questions or concerns.  We can certainly see her sooner if needed.  I spent 25 minutes counseling the patient face to face.  The total time spent in the appointment was 30 minutes.  Minette Headland, [place] [phone] 04/08/2014 10:06 AM

## 2014-04-08 NOTE — Progress Notes (Signed)
Dr. Jana Hakim called to sign orders for this patient who was seen by Ria Comment, NP today. Per MD, OK to treat but patient will need echo before next treatment. MD to ask NP to set this up.

## 2014-04-08 NOTE — Patient Instructions (Signed)
Take compazine or phenergan every 6 hours as needed for nausea for the first three days following chemotherapy.  Then take Zofran twice a day for nausea.         Palonosetron Injection What is this medicine? PALONOSETRON (pal oh NOE se tron) is used to prevent nausea and vomiting caused by chemotherapy. It also helps prevent delayed nausea and vomiting that may occur a few days after your treatment. This medicine may be used for other purposes; ask your health care provider or pharmacist if you have questions. COMMON BRAND NAME(S): Aloxi What should I tell my health care provider before I take this medicine? They need to know if you have any of these conditions: -irregular heart rhythm -an unusual or allergic reaction to palonosetron, dolasetron, granisetron, ondansetron, other medicines, foods, dyes, or preservatives -pregnant or trying to get pregnant -breast-feeding How should I use this medicine? This medicine is for infusion into a vein. It is given by a health care professional in a hospital or clinic setting. Talk to your pediatrician regarding the use of this medicine in children. While this drug may be prescribed for children as young as 1 month for selected conditions, precautions do apply. Overdosage: If you think you have taken too much of this medicine contact a poison control center or emergency room at once. NOTE: This medicine is only for you. Do not share this medicine with others. What if I miss a dose? This does not apply. What may interact with this medicine? Do not take this medicine with any of the following medications: -certain medicines for fungal infections like fluconazole, itraconazole, ketoconazole, posaconazole, voriconazole -cisapride -dofetlide -dronedarone -droperidol -pimozide -thioridazine -ziprasidone  This medicine may also interact with the following medications: -certain medicines for depression, anxiety, or psychotic  disturbances -fentanyl -linezolid -MAOIs like Carbex, Eldepryl, Marplan, Nardil, and Parnate -methylene blue (injected into a vein) -other medicines that prolong the QT interval (cause an abnormal heart rhythm) -tramadol This list may not describe all possible interactions. Give your health care provider a list of all the medicines, herbs, non-prescription drugs, or dietary supplements you use. Also tell them if you smoke, drink alcohol, or use illegal drugs. Some items may interact with your medicine. What should I watch for while using this medicine? Your condition will be monitored carefully while you are receiving this medicine. What side effects may I notice from receiving this medicine? Side effects that you should report to your doctor or health care professional as soon as possible: -allergic reactions like skin rash, itching or hives, swelling of the face, lips, or tongue -breathing problems -confusion -dizziness -fast or irregular heartbeat -feeling faint or lightheaded, falls -fever and chills -loss of balance or coordination -seizures -sweating -swelling of the hands and feet -tightness in the chest -tremors -unusually weak or tired Side effects that usually do not require medical attention (report to your doctor or health care professional if they continue or are bothersome): -constipation or diarrhea -headache This list may not describe all possible side effects. Call your doctor for medical advice about side effects. You may report side effects to FDA at 1-800-FDA-1088. Where should I keep my medicine? This drug is given in a hospital or clinic and will not be stored at home. NOTE: This sheet is a summary. It may not cover all possible information. If you have questions about this medicine, talk to your doctor, pharmacist, or health care provider.  2014, Elsevier/Gold Standard. (2013-08-26 16:29:13) Fosaprepitant injection What is this medicine? FOSAPREPITANT (  fos  ap RE pi tant) is used together with other medicines to prevent nausea and vomiting caused by cancer treatment (chemotherapy). This medicine may be used for other purposes; ask your health care provider or pharmacist if you have questions. COMMON BRAND NAME(S): Emend What should I tell my health care provider before I take this medicine? They need to know if you have any of these conditions: -liver disease -an unusual or allergic reaction to fosaprepitant, aprepitant, medicines, foods, dyes, or preservatives -pregnant or trying to get pregnant -breast-feeding How should I use this medicine? This medicine is for injection into a vein. It is given by a health care professional in a hospital or clinic setting. Talk to your pediatrician regarding the use of this medicine in children. Special care may be needed. Overdosage: If you think you have taken too much of this medicine contact a poison control center or emergency room at once. NOTE: This medicine is only for you. Do not share this medicine with others. What if I miss a dose? This does not apply. What may interact with this medicine? Do not take this medicine with any of these medicines: -cisapride -pimozide -ranolazine This medicine may also interact with the following medications: -diltiazem -female hormones, like estrogens or progestins and birth control pills -medicines for fungal infections like ketoconazole and itraconazole -medicines for HIV -medicines for seizures or to control epilepsy like carbamazepine or phenytoin -medicines used for sleep or anxiety disorders like alprazolam, diazepam, or midazolam -nefazodone -paroxetine -rifampin -some chemotherapy medications like etoposide, ifosfamide, vinblastine, vincristine -some antibiotics like clarithromycin, erythromycin, troleandomycin -steroid medicines like dexamethasone or methylprednisolone -tolbutamide -warfarin This list may not describe all possible interactions.  Give your health care provider a list of all the medicines, herbs, non-prescription drugs, or dietary supplements you use. Also tell them if you smoke, drink alcohol, or use illegal drugs. Some items may interact with your medicine. What should I watch for while using this medicine? Do not take this medicine if you already have nausea and vomiting. Ask your health care provider what to do if you already have nausea. Birth control pills may not work properly while you are taking this medicine. Talk to your doctor about using an extra method of birth control. This medicine should not be used continuously for a long time. Visit your doctor or health care professional for regular check-ups. This medicine may change your liver function blood test results. What side effects may I notice from receiving this medicine? Side effects that you should report to your doctor or health care professional as soon as possible: -allergic reactions like skin rash, itching or hives, swelling of the face, lips, or tongue -breathing problems -changes in heart rhythm -high or low blood pressure -pain, redness, or irritation at site where injected -rectal bleeding -serious dizziness or disorientation, confusion -sharp or severe stomach pain -sharp pain in your leg Side effects that usually do not require medical attention (report to your doctor or health care professional if they continue or are bothersome): -constipation or diarrhea -hair loss -headache -hiccups -loss of appetite -nausea -upset stomach -tiredness This list may not describe all possible side effects. Call your doctor for medical advice about side effects. You may report side effects to FDA at 1-800-FDA-1088. Where should I keep my medicine? This drug is given in a hospital or clinic and will not be stored at home. NOTE: This sheet is a summary. It may not cover all possible information. If you have questions about this  medicine, talk to your  doctor, pharmacist, or health care provider.  2014, Elsevier/Gold Standard. (2009-11-01 12:46:13)

## 2014-04-08 NOTE — Patient Instructions (Addendum)
[facility] Discharge Instructions for Patients Receiving Chemotherapy  Today you received the following chemotherapy agents: Herceptin, Perjeta, Taxotere, Carboplatin   To help prevent nausea and vomiting after your treatment, we encourage you to take your nausea medication as prescribed. You received Decadron, Emend, and Aloxi in the infusion room. These meds are to help control your delayed nausea. Take Compazine and Phenergan as needed for nausea, and begin taking zofran in 3 days as needed for nausea.    If you develop nausea and vomiting that is not controlled by your nausea medication, call the clinic.   BELOW ARE SYMPTOMS THAT SHOULD BE REPORTED IMMEDIATELY:  *FEVER GREATER THAN 100.5 F  *CHILLS WITH OR WITHOUT FEVER  NAUSEA AND VOMITING THAT IS NOT CONTROLLED WITH YOUR NAUSEA MEDICATION  *UNUSUAL SHORTNESS OF BREATH  *UNUSUAL BRUISING OR BLEEDING  TENDERNESS IN MOUTH AND THROAT WITH OR WITHOUT PRESENCE OF ULCERS  *URINARY PROBLEMS  *BOWEL PROBLEMS  UNUSUAL RASH Items with * indicate a potential emergency and should be followed up as soon as possible.  Feel free to call the clinic you have any questions or concerns. The clinic phone number is [phone].

## 2014-04-09 ENCOUNTER — Ambulatory Visit (HOSPITAL_BASED_OUTPATIENT_CLINIC_OR_DEPARTMENT_OTHER): Payer: Medicaid Other

## 2014-04-09 VITALS — BP 120/69 | HR 75 | Temp 98.0°F

## 2014-04-09 DIAGNOSIS — Z5189 Encounter for other specified aftercare: Secondary | ICD-10-CM

## 2014-04-09 DIAGNOSIS — C773 Secondary and unspecified malignant neoplasm of axilla and upper limb lymph nodes: Secondary | ICD-10-CM

## 2014-04-09 DIAGNOSIS — C50419 Malignant neoplasm of upper-outer quadrant of unspecified female breast: Secondary | ICD-10-CM

## 2014-04-09 DIAGNOSIS — C50412 Malignant neoplasm of upper-outer quadrant of left female breast: Secondary | ICD-10-CM

## 2014-04-09 MED ORDER — PEGFILGRASTIM INJECTION 6 MG/0.6ML
6.0000 mg | Freq: Once | SUBCUTANEOUS | Status: AC
  Administered 2014-04-09: 6 mg via SUBCUTANEOUS
  Filled 2014-04-09: qty 0.6

## 2014-04-13 ENCOUNTER — Emergency Department (HOSPITAL_COMMUNITY): Payer: Medicaid Other

## 2014-04-13 ENCOUNTER — Telehealth: Payer: Self-pay | Admitting: Adult Health

## 2014-04-13 ENCOUNTER — Emergency Department (HOSPITAL_COMMUNITY)
Admission: EM | Admit: 2014-04-13 | Discharge: 2014-04-13 | Disposition: A | Discharge status: Home / Self Care | Payer: Medicaid Other | Attending: Emergency Medicine | Admitting: Emergency Medicine

## 2014-04-13 ENCOUNTER — Telehealth: Payer: Self-pay | Admitting: *Deleted

## 2014-04-13 ENCOUNTER — Encounter (HOSPITAL_COMMUNITY): Payer: Self-pay | Admitting: Emergency Medicine

## 2014-04-13 DIAGNOSIS — J159 Unspecified bacterial pneumonia: Secondary | ICD-10-CM | POA: Insufficient documentation

## 2014-04-13 DIAGNOSIS — IMO0002 Reserved for concepts with insufficient information to code with codable children: Secondary | ICD-10-CM | POA: Insufficient documentation

## 2014-04-13 DIAGNOSIS — J189 Pneumonia, unspecified organism: Secondary | ICD-10-CM

## 2014-04-13 DIAGNOSIS — Z789 Other specified health status: Secondary | ICD-10-CM | POA: Insufficient documentation

## 2014-04-13 DIAGNOSIS — Z3202 Encounter for pregnancy test, result negative: Secondary | ICD-10-CM | POA: Insufficient documentation

## 2014-04-13 DIAGNOSIS — R002 Palpitations: Secondary | ICD-10-CM | POA: Insufficient documentation

## 2014-04-13 DIAGNOSIS — Z8742 Personal history of other diseases of the female genital tract: Secondary | ICD-10-CM | POA: Insufficient documentation

## 2014-04-13 DIAGNOSIS — Z87891 Personal history of nicotine dependence: Secondary | ICD-10-CM | POA: Insufficient documentation

## 2014-04-13 DIAGNOSIS — Z79899 Other long term (current) drug therapy: Secondary | ICD-10-CM | POA: Insufficient documentation

## 2014-04-13 DIAGNOSIS — Z853 Personal history of malignant neoplasm of breast: Secondary | ICD-10-CM | POA: Insufficient documentation

## 2014-04-13 DIAGNOSIS — I10 Essential (primary) hypertension: Secondary | ICD-10-CM | POA: Insufficient documentation

## 2014-04-13 DIAGNOSIS — R110 Nausea: Secondary | ICD-10-CM | POA: Insufficient documentation

## 2014-04-13 DIAGNOSIS — D509 Iron deficiency anemia, unspecified: Secondary | ICD-10-CM | POA: Insufficient documentation

## 2014-04-13 DIAGNOSIS — Z792 Long term (current) use of antibiotics: Secondary | ICD-10-CM | POA: Insufficient documentation

## 2014-04-13 LAB — COMPREHENSIVE METABOLIC PANEL
ALT: 17 U/L (ref 0–35)
AST: 16 U/L (ref 0–37)
Albumin: 3.1 g/dL — ABNORMAL LOW (ref 3.5–5.2)
Alkaline Phosphatase: 147 U/L — ABNORMAL HIGH (ref 39–117)
BUN: 21 mg/dL (ref 6–23)
CO2: 28 mEq/L (ref 19–32)
Calcium: 9.1 mg/dL (ref 8.4–10.5)
Chloride: 103 mEq/L (ref 96–112)
Creatinine, Ser: 0.88 mg/dL (ref 0.50–1.10)
GFR calc Af Amer: >90 mL/min (ref >90)
GFR calc non Af Amer: 81 mL/min — ABNORMAL LOW (ref >90)
Glucose, Bld: 107 mg/dL — ABNORMAL HIGH (ref 70–99)
Potassium: 4.1 mEq/L (ref 3.7–5.3)
Sodium: 142 mEq/L (ref 137–147)
Total Bilirubin: <0.2 mg/dL — ABNORMAL LOW (ref 0.3–1.2)
Total Protein: 6.3 g/dL (ref 6.0–8.3)

## 2014-04-13 LAB — CBC WITH DIFFERENTIAL/PLATELET
Basophils Absolute: 0 10*3/uL (ref 0.0–0.1)
Basophils Relative: 0 % (ref 0–1)
Eosinophils Absolute: 0 10*3/uL (ref 0.0–0.7)
Eosinophils Relative: 0 % (ref 0–5)
HCT: 30.2 % — ABNORMAL LOW (ref 36.0–46.0)
Hemoglobin: 9.8 g/dL — ABNORMAL LOW (ref 12.0–15.0)
Lymphocytes Relative: 27 % (ref 12–46)
Lymphs Abs: 3.6 10*3/uL (ref 0.7–4.0)
MCH: 26.6 pg (ref 26.0–34.0)
MCHC: 32.5 g/dL (ref 30.0–36.0)
MCV: 82.1 fL (ref 78.0–100.0)
Monocytes Absolute: 0.8 10*3/uL (ref 0.1–1.0)
Monocytes Relative: 6 % (ref 3–12)
Neutro Abs: 8.9 10*3/uL — ABNORMAL HIGH (ref 1.7–7.7)
Neutrophils Relative %: 67 % (ref 43–77)
Platelets: 358 10*3/uL (ref 150–400)
RBC: 3.68 MIL/uL — ABNORMAL LOW (ref 3.87–5.11)
WBC: 13.3 10*3/uL — ABNORMAL HIGH (ref 4.0–10.5)

## 2014-04-13 LAB — URINALYSIS, ROUTINE W REFLEX MICROSCOPIC
Bilirubin Urine: NEGATIVE
Glucose, UA: NEGATIVE mg/dL
Ketones, ur: NEGATIVE mg/dL
Nitrite: NEGATIVE
Protein, ur: NEGATIVE mg/dL
Specific Gravity, Urine: 1.016 (ref 1.005–1.030)
Urobilinogen, UA: 1 mg/dL (ref 0.0–1.0)
pH: 8.5 — ABNORMAL HIGH (ref 5.0–8.0)

## 2014-04-13 LAB — PREGNANCY, URINE: Preg Test, Ur: NEGATIVE

## 2014-04-13 LAB — URINE MICROSCOPIC-ADD ON

## 2014-04-13 MED ORDER — SODIUM CHLORIDE 0.9 % IV BOLUS (SEPSIS)
1000.0000 mL | Freq: Once | INTRAVENOUS | Status: AC
  Administered 2014-04-13: 1000 mL via INTRAVENOUS

## 2014-04-13 MED ORDER — LEVOFLOXACIN 750 MG PO TABS: 750.0000 mg | ORAL_TABLET | Freq: Every day | ORAL | Status: DC

## 2014-04-13 MED ORDER — IOHEXOL 350 MG/ML SOLN
100.0000 mL | Freq: Once | INTRAVENOUS | Status: AC | PRN
  Administered 2014-04-13: 100 mL via INTRAVENOUS

## 2014-04-13 MED ORDER — PROMETHAZINE HCL 25 MG/ML IJ SOLN
12.5000 mg | Freq: Once | INTRAMUSCULAR | Status: AC
  Administered 2014-04-13: 12.5 mg via INTRAVENOUS
  Filled 2014-04-13: qty 1

## 2014-04-13 MED ORDER — LEVOFLOXACIN IN D5W 750 MG/150ML IV SOLN
750.0000 mg | Freq: Once | INTRAVENOUS | Status: AC
  Administered 2014-04-13: 750 mg via INTRAVENOUS
  Filled 2014-04-13: qty 150

## 2014-04-13 NOTE — Discharge Instructions (Signed)
REturn here as needed. Follow up with your doctors. Increase your fluid intake.

## 2014-04-13 NOTE — ED Notes (Signed)
Pt 100% on RA while ambulating in hallway

## 2014-04-13 NOTE — ED Provider Notes (Signed)
CSN: [id number]     Arrival date & time 04/13/14  1447 History   First MD Initiated Contact with Patient 04/13/14 1512     Chief Complaint  Patient presents with  . Shortness of Breath  . Palpitations  . Nausea     (Consider location/radiation/quality/duration/timing/severity/associated sxs/prior Treatment) HPI Patient presents to the emergency department with cough, shortness of breath, and palpitations.  The patient, states, that she's had palpitations since she started the chemotherapy.  Patient, states, that she has had some mild discomfort in her chest at times, but not consistently.  The patient, states, that she has not had any dizziness, headache, blurred vision, back pain, neck pain, fever, abdominal pain, nausea, vomiting, diarrhea, dysuria, sore throat, rash, or syncope.  The patient, states nothing seems to make her condition, better or worse.  Patient, states she only took her prescribed medications prior to arrival Past Medical History  Diagnosis Date  . Endometriosis   . Hypertension   . Rash     fine rash on abd  . Anemia   . Wears glasses   . 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 Iron deficiency anemia, unspecified 02/25/2014   Past Surgical History  Procedure Laterality Date  . Cervical polypectomy  2010  . Cesarean section      one previous  . Tubal ligation    . Portacath placement Right 02/23/2014    Procedure: INSERTION PORT-A-CATH;  Surgeon: Joyice Faster. Cornett, MD;  Location: [facility];  Service: General;  Laterality: Right;   Family History  Problem Relation Age of Onset  . Breast cancer Mother 80  . Diabetes Father   . Pancreatic cancer Father 37  . Uterine cancer Maternal Aunt     dx in her 69s-50s  . Breast cancer Paternal Aunt     dx in her 75s  . Stroke Maternal Grandfather   . Breast cancer Paternal Aunt     dx in her 55s-50s  . Breast cancer Cousin     paternal cousin dx in her 50s-40s   History  Substance Use Topics   . Smoking status: Former Smoker -- 0.25 packs/day for 15 years    Quit date: 02/18/2009  . Smokeless tobacco: Former Systems developer  . Alcohol Use: Yes     Comment: occasional   OB History   Grav Para Term Preterm Abortions TAB SAB Ect Mult Living   5 5 5       5      Review of Systems  All other systems negative except as documented in the HPI. All pertinent positives and negatives as reviewed in the HPI.  Allergies  Review of patient's allergies indicates no known allergies.  Home Medications   Prior to Admission medications   Medication Sig Start Date End Date Taking? Authorizing Provider  dexamethasone (DECADRON) 4 MG tablet Take 2 tablets (8 mg total) by mouth 2 (two) times daily. Start the day before Taxotere. Then again the day after chemo for 3 days. 02/16/14  Yes Deatra Robinson, MD  doxycycline (VIBRAMYCIN) 50 MG capsule Take 2 capsules (100 mg total) by mouth 2 (two) times daily. 04/05/14  Yes Thomas A. Cornett, MD  ferrous sulfate 325 (65 FE) MG EC tablet Take 1 tablet (325 mg total) by mouth 3 (three) times daily with meals. 03/04/14  Yes Minette Headland, NP  lidocaine-prilocaine (EMLA) cream Apply 1 application topically as needed (For port-a-cath.).   Yes Historical Provider, MD  loratadine (CLARITIN)  10 MG tablet Take 10 mg by mouth daily as needed (For inflammation after receiving her injections during chemo.).    Yes Historical Provider, MD  LORazepam (ATIVAN) 0.5 MG tablet Take 1 tablet (0.5 mg total) by mouth every 6 (six) hours as needed (Nausea or vomiting). 02/16/14  Yes Deatra Robinson, MD  ondansetron (ZOFRAN) 8 MG tablet Take 1 tablet (8 mg total) by mouth 2 (two) times daily. Start the day after chemo for 3 days. Then take as needed for nausea or vomiting. 02/16/14  Yes Deatra Robinson, MD  oxyCODONE-acetaminophen (PERCOCET/ROXICET) 5-325 MG per tablet Take 1 tablet by mouth every 4 (four) hours as needed for severe pain.    Yes Historical Provider, MD  PRESCRIPTION  MEDICATION She receives her chemo treatments at the [facility]. Her oncologist is Dr. Humphrey Rolls. She received her last chemo treatments of Taxotere 149m Injection, Carboplatin 8477m Herceptin 52512mnd Perjeta 420m65m 04/08/14. She also received Neulasta 6mg 51mection on 04/09/14.   Yes Historical Provider, MD  prochlorperazine (COMPAZINE) 10 MG tablet Take 1 tablet (10 mg total) by mouth every 6 (six) hours as needed (Nausea or vomiting). 02/16/14  Yes KalsoDeatra Robinson promethazine (PHENERGAN) 25 MG tablet Take 1 tablet (25 mg total) by mouth every 6 (six) hours as needed for nausea or vomiting. 03/25/14  Yes LindsMinette Headland  BP 133/57  Pulse 55  Temp(Src) 98.4 F (36.9 C)  Resp 16  SpO2 99%  LMP 04/04/2014 Physical Exam  Nursing note and vitals reviewed. Constitutional: She is oriented to person, place, and time. She appears well-developed and well-nourished. No distress.  HENT:  Head: Normocephalic and atraumatic.  Mouth/Throat: Oropharynx is clear and moist.  Eyes: Pupils are equal, round, and reactive to light.  Neck: Normal range of motion. Neck supple.  Cardiovascular: Normal rate, regular rhythm and normal heart sounds.  Exam reveals no gallop and no friction rub.   No murmur heard. Pulmonary/Chest: Effort normal and breath sounds normal. No respiratory distress.  Neurological: She is alert and oriented to person, place, and time. She exhibits normal muscle tone. Coordination normal.  Skin: Skin is warm and dry. No rash noted. No erythema.    ED Course  Procedures (including critical care time) Labs Review Labs Reviewed  CBC WITH DIFFERENTIAL - Abnormal; Notable for the following:    WBC 13.3 (*)    RBC 3.68 (*)    Hemoglobin 9.8 (*)    HCT 30.2 (*)    Neutro Abs 8.9 (*)    All other components within normal limits  COMPREHENSIVE METABOLIC PANEL - Abnormal; Notable for the following:    Glucose, Bld 107 (*)    Albumin 3.1 (*)    Alkaline  Phosphatase 147 (*)    Total Bilirubin <0.2 (*)    GFR calc non Af Amer 81 (*)    All other components within normal limits  URINALYSIS, ROUTINE W REFLEX MICROSCOPIC - Abnormal; Notable for the following:    APPearance CLOUDY (*)    pH 8.5 (*)    Hgb urine dipstick LARGE (*)    Leukocytes, UA MODERATE (*)    All other components within normal limits  URINE MICROSCOPIC-ADD ON - Abnormal; Notable for the following:    Squamous Epithelial / LPF FEW (*)    Bacteria, UA FEW (*)    All other components within normal limits  URINE CULTURE  PREGNANCY, URINE    Imaging Review Dg  Chest 2 View  04/13/2014   CLINICAL DATA:  SOB  EXAM: CHEST  2 VIEW  COMPARISON:  DG CHEST 2 VIEW dated 03/08/2014  FINDINGS: Low lung volumes. Left-sided port catheter tip superior vena caval right atrial junction. Lungs are clear. No acute osseus abnormalities.  IMPRESSION: No active cardiopulmonary disease.   Electronically Signed   By: Margaree Mackintosh M.D.   On: 04/13/2014 16:08   Ct Angio Chest Pe W/cm &/or Wo Cm  04/13/2014   CLINICAL DATA:  Shortness of breath, tachycardia, history of breast cancer  EXAM: CT ANGIOGRAPHY CHEST WITH CONTRAST  TECHNIQUE: Multidetector CT imaging of the chest was performed using the standard protocol during bolus administration of intravenous contrast. Multiplanar CT image reconstructions and MIPs were obtained to evaluate the vascular anatomy.  CONTRAST:  113m OMNIPAQUE IOHEXOL 350 MG/ML SOLN  COMPARISON:  CT chest dated 02/19/2014  FINDINGS: No evidence of pulmonary embolism.  Mild branching patchy opacity at the right lung base (series 11/ images 60 and 63), suspicious for very mild pneumonia. 9 x 4 mm left lower lobe nodule (series 11/ image 40), previously 7 x 4 mm, although not favored to reflect a metastasis due to the appearance/configuration. No pleural effusion or pneumothorax.  The heart is mildly enlarged.  No pericardial effusion.  Right chest port terminating at the cavoatrial  junction.  Near-complete resolution of prior lateral left breast mass and left axillary lymphadenopathy. Residual 6 mm short axis left axillary node (series 4/ image 20).  Review of the MIP images confirms the above findings.  IMPRESSION: No evidence of pulmonary embolism.  Mild patchy opacity at the right lung base, suspicious for very mild pneumonia.  Near-complete resolution of prior lateral left breast mass and left axillary lymphadenopathy.  9 x 4 mm left lower lobe nodule, possibly mildly increased, although not favored to reflect a metastasis due to the appearance/configuration. Attention on follow-up is suggested.   Electronically Signed   By: SJulian HyM.D.   On: 04/13/2014 18:10     Patient is feeling improved following IV fluids.  She was given IV Levaquin due to the fact that she has a suspicious mild area of infiltrate on her chest CT the patient was ambulated with pulse oximetry in place, and she maintained her oxygen saturations in the upper 90s.  The patient will be advised followup with her primary care Dr. told to return here as needed.  To be treated on an outpatient basis with Levaquin.   CBrent General PA-C 04/13/14 2024

## 2014-04-13 NOTE — Telephone Encounter (Signed)
Received voice message from patient stating,"it's hard for me to breathe, and I'm really nauseated." This RN spoke with patient and advised patient to hang up with me and call 911. Patient's daughter is with her. Patient verbalized understanding of calling 911.

## 2014-04-13 NOTE — Telephone Encounter (Signed)
, °

## 2014-04-13 NOTE — ED Notes (Addendum)
Pt states she has felt short of breath since 6AM this morning. Pt states began feeling heart palpitations yesterday and continues to feel them today. Pt has hx of breast cancer, diagnosed in March of this year. Pt is on chemo, which she gets every 3 weeks, the last time was on Thursday. Pt states she was told the chemo she takes can affect her heart and has an echocardiogram scheduled. Pt has infected port, is on doxycycline, which she states she has not taken today. Pt states she also feels nausea, denies emesis. Pt denies pain.

## 2014-04-13 NOTE — ED Notes (Signed)
Bed: XN17 Expected date:  Expected time:  Means of arrival:  Comments: EMS cancer pt

## 2014-04-14 ENCOUNTER — Ambulatory Visit (HOSPITAL_BASED_OUTPATIENT_CLINIC_OR_DEPARTMENT_OTHER): Payer: Medicaid Other | Admitting: Genetic Counselor

## 2014-04-14 ENCOUNTER — Encounter: Payer: Self-pay | Admitting: Genetic Counselor

## 2014-04-14 DIAGNOSIS — Z1509 Genetic susceptibility to other malignant neoplasm: Principal | ICD-10-CM

## 2014-04-14 DIAGNOSIS — C50919 Malignant neoplasm of unspecified site of unspecified female breast: Secondary | ICD-10-CM

## 2014-04-14 DIAGNOSIS — Z803 Family history of malignant neoplasm of breast: Secondary | ICD-10-CM

## 2014-04-14 DIAGNOSIS — Z800 Family history of malignant neoplasm of digestive organs: Secondary | ICD-10-CM

## 2014-04-14 DIAGNOSIS — Z809 Family history of malignant neoplasm, unspecified: Secondary | ICD-10-CM

## 2014-04-14 DIAGNOSIS — Z1501 Genetic susceptibility to malignant neoplasm of breast: Secondary | ICD-10-CM

## 2014-04-14 LAB — URINE CULTURE
Colony Count: NO GROWTH
Culture: NO GROWTH

## 2014-04-14 NOTE — Progress Notes (Signed)
Patient Name: Tamara [name] Patient Age: 43 y.o. Encounter Date: 04/14/2014  Referring Physician: Angelica Chessman, MD [address] [place], [place] [address]  Primary Care Provider: Angelica Chessman, MD   Tamara [name], a 43 y.o. female, is being seen at the [facility] due to a personal and family history of breast cancer and a pathogenic BRCA1 mutation.  She presents to clinic today with her uncle, Tamara [name], to discuss the implications of this mutation for her and her family. Tamara [name] had previously seen Tamara [name] for genetic counseling. Please refer to her note on 02/11/14.  HISTORY OF PRESENT ILLNESS: Tamara [name] was diagnosed with breast cancer (IDC) at the age of 77. The tumor was ER/PR negative and HER2 positive. She is currently receiving neoadjuvant chemotherapy. She is planning on having bilateral mastecomies as well as risk-reducing bilateral salpingo-oophorectomy.  She does not have any other history of cancer.  Tamara [name] underwent genetic testing with the [facility] panel and was found to have a pathogenic mutation in the BRCA1 gene called c.190T>G (p.Cys64Gly).  Past Medical History  Diagnosis Date  . Endometriosis   . Hypertension   . Rash     fine rash on abd  . Anemia   . Wears glasses   . 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 Iron deficiency anemia, unspecified 02/25/2014  . BRCA1 positive     c.190T>G (p.Cys64Gly) @ [facility]     Past Surgical History  Procedure Laterality Date  . Cervical polypectomy  2010  . Cesarean section      one previous  . Tubal ligation    . Portacath placement Right 02/23/2014    Procedure: INSERTION PORT-A-CATH;  Surgeon: Joyice Faster. Cornett, MD;  Location: [facility];  Service: General;  Laterality: Right;    History   Social History  . Marital Status: Legally Separated    Spouse Name: N/A    Number of Children: 5  . Years of Education: N/A   Social History Main Topics  . Smoking  status: Former Smoker -- 0.25 packs/day for 15 years    Quit date: 02/18/2009  . Smokeless tobacco: Former Systems developer  . Alcohol Use: Yes     Comment: occasional  . Drug Use: No  . Sexual Activity: Not Currently    Birth Control/ Protection: Surgical   Other Topics Concern  . Not on file   Social History Narrative  . No narrative on file     FAMILY HISTORY:   During the visit, we updated and corrected her family history and a Tamara 4-generation pedigree was obtained. Significant diagnoses include the following:  Family History  Problem Relation Age of Onset  . Breast cancer Mother 64    currently 88  . Diabetes Father   . Pancreatic cancer Father 58  . Breast cancer Paternal Aunt 44    currently 23; BRCA1 positive  . Stroke Maternal Grandfather   . Cancer Paternal Aunt     unk. primary; deceased 42s  . Breast cancer Cousin     daughter of unaffected paternal aunt; dx in her 60s    Additionally, Tamara [name] has 3 sons (ages 59, 68, and 16) and 2 daughters (ages 87 and 21). She has no full siblings. She has a maternal half-brother. Tamara [name] reports that her paternal aunt who had breast cancer had genetic testing many years ago and has the BRCA1 mutation. She states that this aunt did not share this information with  her until after Tamara [name]'s diagnosis.  Tamara [name] ancestry is African American. There is no known Jewish ancestry and no consanguinity.  ASSESSMENT AND PLAN: Tamara [name] is a 43 y.o. female with a personal and family history of breast cancer and a pathogenic BRCA1 mutation in her and her paternal aunt. We reviewed the characteristics, features and inheritance patterns of Hereditary Breast/Ovarian Cancer syndrome including risk-reduction and screening options for breast cancer. We discussed that there are no effective screenings for ovarian cancer and she is recommended to undergo BSO. Tamara [name] stated she is interested in bilateral mastectomies and BSO and will speak to her  physicians regarding these surgeries.  We also discussed genetic testing in her family and the importance of sharing results with her children and all paternal relatives, including men. She understood each of her sons and daughters has a 50% chance of having inherited this BRCA1 mutation. We discussed the appropriate age of when genetic testing is recommended. Tamara [name] was given information for the [employer] of [employer] ([employer]) in order to locate genetic counselors in other cities. She was given information for [employer] to find accurate, consumer-friendly information and support online.  We encouraged Tamara [name] to remain in contact with Cancer Genetics annually so that we can update the family history and inform her of any changes in cancer genetics and testing that may be of benefit for this family. Ms.  [name] questions were answered to her satisfaction today.   Thank you for the referral and allowing [place] to share in the care of your patient.   The patient was seen for a total of 30 minutes, greater than 50% of which was spent face-to-face counseling. This patient was discussed with the referring provider who agrees with the above.

## 2014-04-15 ENCOUNTER — Ambulatory Visit (HOSPITAL_BASED_OUTPATIENT_CLINIC_OR_DEPARTMENT_OTHER): Payer: Medicaid Other | Admitting: Adult Health

## 2014-04-15 ENCOUNTER — Ambulatory Visit (HOSPITAL_BASED_OUTPATIENT_CLINIC_OR_DEPARTMENT_OTHER): Payer: Medicaid Other

## 2014-04-15 ENCOUNTER — Encounter: Payer: Self-pay | Admitting: Adult Health

## 2014-04-15 ENCOUNTER — Other Ambulatory Visit (HOSPITAL_BASED_OUTPATIENT_CLINIC_OR_DEPARTMENT_OTHER): Payer: Medicaid Other

## 2014-04-15 VITALS — BP 119/82 | HR 101 | Temp 98.6°F | Resp 18 | Ht 65.0 in | Wt 195.8 lb

## 2014-04-15 DIAGNOSIS — R110 Nausea: Secondary | ICD-10-CM | POA: Diagnosis not present

## 2014-04-15 DIAGNOSIS — E860 Dehydration: Secondary | ICD-10-CM

## 2014-04-15 DIAGNOSIS — C50419 Malignant neoplasm of upper-outer quadrant of unspecified female breast: Secondary | ICD-10-CM

## 2014-04-15 DIAGNOSIS — C50412 Malignant neoplasm of upper-outer quadrant of left female breast: Secondary | ICD-10-CM

## 2014-04-15 DIAGNOSIS — C773 Secondary and unspecified malignant neoplasm of axilla and upper limb lymph nodes: Secondary | ICD-10-CM | POA: Diagnosis not present

## 2014-04-15 LAB — COMPREHENSIVE METABOLIC PANEL (CC13)
ALBUMIN: 3.3 g/dL — AB (ref 3.5–5.0)
ALT: 27 U/L (ref 0–55)
ANION GAP: 14 meq/L — AB (ref 3–11)
AST: 17 U/L (ref 5–34)
Alkaline Phosphatase: 114 U/L (ref 40–150)
BUN: 14.6 mg/dL (ref 7.0–26.0)
CO2: 23 meq/L (ref 22–29)
Calcium: 9.4 mg/dL (ref 8.4–10.4)
Chloride: 102 mEq/L (ref 98–109)
Creatinine: 0.9 mg/dL (ref 0.6–1.1)
GLUCOSE: 104 mg/dL (ref 70–140)
POTASSIUM: 3.6 meq/L (ref 3.5–5.1)
Sodium: 140 mEq/L (ref 136–145)
TOTAL PROTEIN: 6.5 g/dL (ref 6.4–8.3)

## 2014-04-15 LAB — CBC WITH DIFFERENTIAL/PLATELET
BASO%: 0.2 % (ref 0.0–2.0)
BASOS ABS: 0 10*3/uL (ref 0.0–0.1)
EOS ABS: 0 10*3/uL (ref 0.0–0.5)
EOS%: 0.2 % (ref 0.0–7.0)
HCT: 29.9 % — ABNORMAL LOW (ref 34.8–46.6)
HEMOGLOBIN: 9.8 g/dL — AB (ref 11.6–15.9)
LYMPH%: 19.1 % (ref 14.0–49.7)
MCH: 26.5 pg (ref 25.1–34.0)
MCHC: 32.9 g/dL (ref 31.5–36.0)
MCV: 80.5 fL (ref 79.5–101.0)
MONO#: 1.5 10*3/uL — ABNORMAL HIGH (ref 0.1–0.9)
MONO%: 9.1 % (ref 0.0–14.0)
NEUT%: 71.4 % (ref 38.4–76.8)
NEUTROS ABS: 11.9 10*3/uL — AB (ref 1.5–6.5)
PLATELETS: 333 10*3/uL (ref 145–400)
RBC: 3.71 10*6/uL (ref 3.70–5.45)
RDW: 34.4 % — ABNORMAL HIGH (ref 11.2–14.5)
WBC: 16.6 10*3/uL — ABNORMAL HIGH (ref 3.9–10.3)
lymph#: 3.2 10*3/uL (ref 0.9–3.3)

## 2014-04-15 MED ORDER — ONDANSETRON 16 MG/50ML IVPB (CHCC)
INTRAVENOUS | Status: AC
  Filled 2014-04-15: qty 16

## 2014-04-15 MED ORDER — SODIUM CHLORIDE 0.9 % IV SOLN
Freq: Once | INTRAVENOUS | Status: AC
  Administered 2014-04-15: 15:00:00 via INTRAVENOUS

## 2014-04-15 MED ORDER — ONDANSETRON 16 MG/50ML IVPB (CHCC)
16.0000 mg | Freq: Once | INTRAVENOUS | Status: AC
  Administered 2014-04-15: 16 mg via INTRAVENOUS

## 2014-04-15 NOTE — Progress Notes (Signed)
Hematology and Oncology Follow Up Visit  Tamara [name] [id number] Jul 01, 1971 43 y.o. 04/17/2014 7:57 AM     Principle Diagnosis:Tamara [name] 43 y.o. 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     Prior Therapy:  #1 The patient palpated a left breast mass in the upper outer quadrant. Mammogram showed a 3.2 cm mass in the 2:00 position 7 cm from the nipple. There was also noted to be an 8 mm mass 8 cm from the nipple in the ipsilateral breast. She was also noted to have positive lymph nodes. She had a biopsy of both masses performed as well as the lymph node. 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 MRI confirmed 2.8 and 1.3 cm mass is as well as lymph node. On the right he was noted to have a 6 mm nodule. This has been biopsied and is negative.   #2 staging scans: PET scan  1. Hypermetabolic left breast mass consistent with known neoplasm.  There are also FDG positive left axillary lymph nodes.  2. Benign-appearing brown fat activity and muscular activity in the  neck and chest but no findings for metastatic disease involving the  neck, chest, abdomen or pelvis.  3. No findings for metastatic bone disease.  4. Moderate FDG activity in the endometrial canal is likely due to  secretory phase of ovulation or menses. No mass is seen.  5. Uterine fibroids  CT SCAN:  1. 3 cm left breast mass corresponding to the patient's known  neoplasm. Enlarged left axillary lymph nodes are positive on the PET  scan.  2. No CT findings for metastatic disease involving the chest,  abdomen or pelvis and no evidence of osseous metastatic disease.  3. Mildly enlarged fibroid uterus.   #3 genetic testing: Patient is a BRCA1 mutation carrier   #4 echocardiogram:  02/19/2014: Left ventricular ejection  fraction 45-50%. Patient was seen by cardiology and they have cleared her for chemotherapy with anti-HER-2-based regimen   #5 neoadjuvant chemotherapy: Patient recommended Taxotere carboplatinum Herceptin and perjeta every 3 weeks for a total of 6 cycles. She started this therapy on 02/25/14.   Current therapy:  Neoadjuvant Taxotere, Carboplatin, Herceptin, Perjeta cycle 3 day 8   Interim History: Tamara [name] 43 y.o. female with stage II, HER-2/neu positive, invasive ductal carcinoma of the left breast.  She is here for evaluation after receiving cycle 3 of 6 planned treatments of Taxotere, Carboplatin, Herceptin, and Perjeta.  She receives this treatment on day 1 of a 21 day cycle with Neulasta support given on day 2.    She is cycle 3 day 8 of treatment.  She is doing moderately well today. Her nausea was much improved with the change in her premedications to fosaprepitant and aloxi.  She still is struggling these past couple of days to take in fluids and food.  She is requesting IV hydration and IV anti-emetics.  She was recently seen in the ER for shortness of breath and chest pain.  A CT scan was consistent with pneumonia.  She was prescribed Levaquin and has been taking this as prescribed.  Her chest pain and shortness of breath are much improved.  Her port is improving and she is performing care to the site as directed by Dr. Brantley Stage.  She otherwise is doing well and denies fevers, chills, vomiting, constipation, diarrhea,  numbness/tingling, mouth pain, skin changes, or any further concerns.   Medications:  Current Outpatient Prescriptions  Medication Sig Dispense Refill  . doxycycline (VIBRAMYCIN) 50 MG capsule Take 2 capsules (100 mg total) by mouth 2 (two) times daily.  56 capsule  0  . ferrous sulfate 325 (65 FE) MG EC tablet Take 1 tablet (325 mg total) by mouth 3 (three) times daily with meals.  90 tablet  3  . levofloxacin (LEVAQUIN) 750 MG tablet Take 1 tablet (750 mg total) by mouth  daily.  7 tablet  0  . ondansetron (ZOFRAN) 8 MG tablet Take 1 tablet (8 mg total) by mouth 2 (two) times daily. Start the day after chemo for 3 days. Then take as needed for nausea or vomiting.  30 tablet  1  . PRESCRIPTION MEDICATION She receives her chemo treatments at the [facility]. Her oncologist is Dr. Humphrey Rolls. She received her last chemo treatments of Taxotere 133m Injection, Carboplatin 8437m Herceptin 52588mnd Perjeta 420m56m 04/08/14. She also received Neulasta 6mg 81mection on 04/09/14.      . proMarland Kitchenethazine (PHENERGAN) 25 MG tablet Take 1 tablet (25 mg total) by mouth every 6 (six) hours as needed for nausea or vomiting.  30 tablet  0  . dexamethasone (DECADRON) 4 MG tablet Take 2 tablets (8 mg total) by mouth 2 (two) times daily. Start the day before Taxotere. Then again the day after chemo for 3 days.  30 tablet  1  . lidocaine-prilocaine (EMLA) cream Apply 1 application topically as needed (For port-a-cath.).      . lorMarland Kitchentadine (CLARITIN) 10 MG tablet Take 10 mg by mouth daily as needed (For inflammation after receiving her injections during chemo.).       . LORMarland Kitchenzepam (ATIVAN) 0.5 MG tablet Take 1 tablet (0.5 mg total) by mouth every 6 (six) hours as needed (Nausea or vomiting).  30 tablet  0  . oxyCODONE-acetaminophen (PERCOCET/ROXICET) 5-325 MG per tablet Take 1 tablet by mouth every 4 (four) hours as needed for severe pain.       . proMarland Kitchenhlorperazine (COMPAZINE) 10 MG tablet Take 1 tablet (10 mg total) by mouth every 6 (six) hours as needed (Nausea or vomiting).  30 tablet  1   No current facility-administered medications for this visit.     Allergies: No Known Allergies  Medical History: Past Medical History  Diagnosis Date  . Endometriosis   . Hypertension   . Rash     fine rash on abd  . Anemia   . Wears glasses   . 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 Iron deficiency anemia, unspecified 02/25/2014  . BRCA1 positive     c.190T>G (p.Cys64Gly) @  [facility]     Surgical History:  Past Surgical History  Procedure Laterality Date  . Cervical polypectomy  2010  . Cesarean section      one previous  . Tubal ligation    . Portacath placement Right 02/23/2014    Procedure: INSERTION PORT-A-CATH;  Surgeon: ThomaJoyice Fasternett, MD;  [facility];  Laterality: Right;     Review of Systems: A 10 point review of systems was conducted and is otherwise negative except for what is noted above.     Physical Exam: Blood pressure 119/82, pulse 101, temperature 98.6 F (37 C), temperature source Oral, resp. rate 18, height _0  (1.651 m), weight 195 lb 12.8 oz (88.814 kg), last menstrual period 04/04/2014. GENERAL:  Patient is a well appearing female in no acute distress HEENT:  Sclerae anicteric.  Oropharynx clear and moist. No ulcerations or evidence of oropharyngeal candidiasis. Neck is supple.  NODES:  No cervical, supraclavicular, or axillary lymphadenopathy palpated.  BREAST EXAM:  Left breast mass about 1cm, soft LUNGS:  Clear to auscultation bilaterally.  No wheezes or rhonchi. HEART:  Regular rate and rhythm. No murmur appreciated. ABDOMEN:  Soft, nontender.  Positive, normoactive bowel sounds. No organomegaly palpated. MSK:  No focal spinal tenderness to palpation. Full range of motion bilaterally in the upper extremities. EXTREMITIES:  No peripheral edema.   SKIN:  Clear with no obvious rashes or skin changes. No nail dyscrasia. Right chest wall port is erythematous, tender, warm and slightly swollen.   NEURO:  Nonfocal. Well oriented.  Appropriate affect. ECOG PERFORMANCE STATUS: 1 - Symptomatic but completely ambulatory   Lab Results: Lab Results  Component Value Date   WBC 16.6* 04/15/2014   HGB 9.8* 04/15/2014   HCT 29.9* 04/15/2014   MCV 80.5 04/15/2014   PLT 333 04/15/2014     Chemistry      Component Value Date/Time   NA 140 04/15/2014 0901   NA 142 04/13/2014 1630   K 3.6  04/15/2014 0901   K 4.1 04/13/2014 1630   CL 103 04/13/2014 1630   CO2 23 04/15/2014 0901   CO2 28 04/13/2014 1630   BUN 14.6 04/15/2014 0901   BUN 21 04/13/2014 1630   CREATININE 0.9 04/15/2014 0901   CREATININE 0.88 04/13/2014 1630      Component Value Date/Time   CALCIUM 9.4 04/15/2014 0901   CALCIUM 9.1 04/13/2014 1630   ALKPHOS 114 04/15/2014 0901   ALKPHOS 147* 04/13/2014 1630   AST 17 04/15/2014 0901   AST 16 04/13/2014 1630   ALT 27 04/15/2014 0901   ALT 17 04/13/2014 1630   BILITOT <0.20 04/15/2014 0901   BILITOT <0.2* 04/13/2014 1630      Assessment and Plan: Tamara [name] 43 y.o. female with  1. Stage II HER-2/neu positive invasive ductal carcinoma of the left breast.  Please see prior history above.  2. Nausea and dehydration:  [name] nausea was improved with the addition of the Fosaprepitant and Aloxi, however, after going to the ED and being diagnosed with pneumonia and started on Levaquin, she has had an increase in nausea and has not been drinking a lot of fluids.  She will return this afternoon for IV hydration and Zofran.    3. Port infection. [name] port skin separation is being managed by Dr. Brantley Stage.  She is taking Doxycycline BID per his direction and he has recommended we not use the port for the time being.  She has f/u with him on 04/27/14.    4.  Pneumonia.  Patient went to the ED this past Tuesday with shortness of breath and chest pain.  CT scan was consistent with mild pneumonia.  She was given one dose of IV Levaquin and received IV fluids.  She was sent home with a prescription of PO Levaquin and has been taking it daily.  She is tolerating the antibiotic well.    5. Cardiac:  02/19/2014: Left ventricular ejection fraction 45-50%. Patient was seen by cardiology and they have cleared her for chemotherapy with anti-HER-2-based regimen   The patient will return this afternoon for IV fluids and in two weeks on 04/29/14 for labs evaluation and cycle 4 of  neoadjuvant treatment.  .  She knows to call  us in the interim for any questions or concerns.  We can certainly see her sooner if needed.  I spent 25 minutes counseling the patient face to face.  The total time spent in the appointment was 30 minutes.  Minette Headland, [place] [phone] 04/17/2014 7:57 AM

## 2014-04-15 NOTE — Patient Instructions (Signed)
Dehydration, Adult Dehydration is when you lose more fluids from the body than you take in. Vital organs like the kidneys, brain, and heart cannot function without a proper amount of fluids and salt. Any loss of fluids from the body can cause dehydration.  CAUSES   Vomiting.  Diarrhea.  Excessive sweating.  Excessive urine output.  Fever. SYMPTOMS  Mild dehydration  Thirst.  Dry lips.  Slightly dry mouth. Moderate dehydration  Very dry mouth.  Sunken eyes.  Skin does not bounce back quickly when lightly pinched and released.  Dark urine and decreased urine production.  Decreased tear production.  Headache. Severe dehydration  Very dry mouth.  Extreme thirst.  Rapid, weak pulse (more than 100 beats per minute at rest).  Cold hands and feet.  Not able to sweat in spite of heat and temperature.  Rapid breathing.  Blue lips.  Confusion and lethargy.  Difficulty being awakened.  Minimal urine production.  No tears. DIAGNOSIS  Your caregiver will diagnose dehydration based on your symptoms and your exam. Blood and urine tests will help confirm the diagnosis. The diagnostic evaluation should also identify the cause of dehydration. TREATMENT  Treatment of mild or moderate dehydration can often be done at home by increasing the amount of fluids that you drink. It is best to drink small amounts of fluid more often. Drinking too much at one time can make vomiting worse. Refer to the home care instructions below. Severe dehydration needs to be treated at the hospital where you will probably be given intravenous (IV) fluids that contain water and electrolytes. HOME CARE INSTRUCTIONS   Ask your caregiver about specific rehydration instructions.  Drink enough fluids to keep your urine clear or pale yellow.  Drink small amounts frequently if you have nausea and vomiting.  Eat as you normally do.  Avoid:  Foods or drinks high in sugar.  Carbonated  drinks.  Juice.  Extremely hot or cold fluids.  Drinks with caffeine.  Fatty, greasy foods.  Alcohol.  Tobacco.  Overeating.  Gelatin desserts.  Wash your hands well to avoid spreading bacteria and viruses.  Only take over-the-counter or prescription medicines for pain, discomfort, or fever as directed by your caregiver.  Ask your caregiver if you should continue all prescribed and over-the-counter medicines.  Keep all follow-up appointments with your caregiver. SEEK MEDICAL CARE IF:  You have abdominal pain and it increases or stays in one area (localizes).  You have a rash, stiff neck, or severe headache.  You are irritable, sleepy, or difficult to awaken.  You are weak, dizzy, or extremely thirsty. SEEK IMMEDIATE MEDICAL CARE IF:   You are unable to keep fluids down or you get worse despite treatment.  You have frequent episodes of vomiting or diarrhea.  You have blood or green matter (bile) in your vomit.  You have blood in your stool or your stool looks black and tarry.  You have not urinated in 6 to 8 hours, or you have only urinated a small amount of very dark urine.  You have a fever.  You faint. MAKE SURE YOU:   Understand these instructions.  Will watch your condition.  Will get help right away if you are not doing well or get worse. Document Released: 11/19/2005 Document Revised: 02/11/2012 Document Reviewed: 07/09/2011 ExitCare Patient Information 2014 [employer].  

## 2014-04-16 NOTE — ED Provider Notes (Signed)
Medical screening examination/treatment/procedure(s) were performed by non-physician practitioner and as supervising physician I was immediately available for consultation/collaboration.   EKG Interpretation   Date/Time:  Tuesday Apr 13 2014 14:56:01 EDT Ventricular Rate:  95 PR Interval:  125 QRS Duration: 89 QT Interval:  362 QTC Calculation: 455 R Axis:   -6 Text Interpretation:  Sinus rhythm Borderline T wave abnormalities ED  PHYSICIAN INTERPRETATION AVAILABLE IN [facility] Confirmed by TEST,  Record ([id number]) on 04/15/2014 7:20:07 AM        Houston Siren III, MD 04/16/14 [id number]

## 2014-04-22 ENCOUNTER — Telehealth: Payer: Self-pay | Admitting: *Deleted

## 2014-04-22 NOTE — Telephone Encounter (Signed)
   Provider input needed: NURSING INTERVENTION.   Reason for call: BRUISE ON THE TOP OF LEFT HAND.  INTEGUMENT OF SKIN OF LEFT HAND   ALLERGIES:  has No Known Allergies.  Patient last received chemotherapy/ treatment on 04/08/14  Patient was last seen in the office on 04/15/14  Next appt is 04/29/14   Is patient having fevers greater than 100.5?  no   Is patient having uncontrolled pain, or new pain? no   Is patient having new back pain that changes with position (worsens or eases when laying down?)  no   Is patient able to eat and drink? yes    Is patient able to pass stool without difficulty?   no     Is patient having uncontrolled nausea?  no    patient calls 04/22/2014 with complaint of  INTEGUMENT OF SKIN   Summary Based on the above information advised patient to [name] TO LEFT HAND. SHE MAY TAKE TYLENOL FOR MINOR DISCOMFORT. PT. WILL CALL IF TOMORROW IF NO IMPROVEMENT.   [name]  04/22/2014, 10:53 AM   Background Info  Tamara [name]   DOB: [birth year]   MR#: [id number]   CSN#   [id number] 04/22/2014

## 2014-04-27 ENCOUNTER — Encounter (INDEPENDENT_AMBULATORY_CARE_PROVIDER_SITE_OTHER): Payer: Self-pay | Admitting: Surgery

## 2014-04-27 ENCOUNTER — Ambulatory Visit (INDEPENDENT_AMBULATORY_CARE_PROVIDER_SITE_OTHER): Payer: Medicaid Other | Admitting: Surgery

## 2014-04-27 VITALS — BP 140/94 | HR 80 | Temp 98.4°F | Resp 16 | Ht 65.0 in | Wt 194.6 lb

## 2014-04-27 DIAGNOSIS — T80212A Local infection due to central venous catheter, initial encounter: Secondary | ICD-10-CM

## 2014-04-27 NOTE — Patient Instructions (Signed)
Ok to use port.  Return 1 month.  Look to do surgery in august.

## 2014-04-27 NOTE — Progress Notes (Signed)
Patient returns after Port-A-Cath placement 10 weeks ago . She has been managed on ABX for local infection which has improved..     Exam: Port site intact with no further  separation. No redness or hematoma. No redness along catheter site.   Impression: Stage II breast cancer status post insertion of Port-A-Cath superficial infection treated with antibiotics   Plan: Improved. Port ready   To use.  Continue neosporin. Return 1 month.

## 2014-04-28 ENCOUNTER — Other Ambulatory Visit: Payer: Self-pay

## 2014-04-29 ENCOUNTER — Telehealth: Payer: Self-pay | Admitting: Adult Health

## 2014-04-29 ENCOUNTER — Ambulatory Visit (HOSPITAL_BASED_OUTPATIENT_CLINIC_OR_DEPARTMENT_OTHER): Payer: Medicaid Other | Admitting: Adult Health

## 2014-04-29 ENCOUNTER — Ambulatory Visit: Payer: Medicaid Other

## 2014-04-29 ENCOUNTER — Encounter: Payer: Self-pay | Admitting: Adult Health

## 2014-04-29 ENCOUNTER — Other Ambulatory Visit (HOSPITAL_BASED_OUTPATIENT_CLINIC_OR_DEPARTMENT_OTHER): Payer: Medicaid Other

## 2014-04-29 VITALS — BP 144/92 | HR 96 | Temp 98.3°F | Resp 18 | Ht 65.0 in | Wt 194.1 lb

## 2014-04-29 DIAGNOSIS — C50419 Malignant neoplasm of upper-outer quadrant of unspecified female breast: Secondary | ICD-10-CM

## 2014-04-29 DIAGNOSIS — C50412 Malignant neoplasm of upper-outer quadrant of left female breast: Secondary | ICD-10-CM

## 2014-04-29 LAB — CBC WITH DIFFERENTIAL/PLATELET
BASO%: 1.2 % (ref 0.0–2.0)
Basophils Absolute: 0.1 10*3/uL (ref 0.0–0.1)
EOS%: 0 % (ref 0.0–7.0)
Eosinophils Absolute: 0 10*3/uL (ref 0.0–0.5)
HCT: 30.3 % — ABNORMAL LOW (ref 34.8–46.6)
HGB: 10 g/dL — ABNORMAL LOW (ref 11.6–15.9)
LYMPH#: 1 10*3/uL (ref 0.9–3.3)
LYMPH%: 13.6 % — ABNORMAL LOW (ref 14.0–49.7)
MCH: 28.4 pg (ref 25.1–34.0)
MCHC: 32.8 g/dL (ref 31.5–36.0)
MCV: 86.3 fL (ref 79.5–101.0)
MONO#: 0.2 10*3/uL (ref 0.1–0.9)
MONO%: 3.3 % (ref 0.0–14.0)
NEUT#: 6.1 10*3/uL (ref 1.5–6.5)
NEUT%: 81.9 % — ABNORMAL HIGH (ref 38.4–76.8)
Platelets: 149 10*3/uL (ref 145–400)
RBC: 3.51 10*6/uL — ABNORMAL LOW (ref 3.70–5.45)
RDW: 34.1 % — ABNORMAL HIGH (ref 11.2–14.5)
WBC: 7.5 10*3/uL (ref 3.9–10.3)

## 2014-04-29 LAB — COMPREHENSIVE METABOLIC PANEL (CC13)
ALT: 17 U/L (ref 0–55)
ANION GAP: 12 meq/L — AB (ref 3–11)
AST: 15 U/L (ref 5–34)
Albumin: 3.9 g/dL (ref 3.5–5.0)
Alkaline Phosphatase: 71 U/L (ref 40–150)
BUN: 20 mg/dL (ref 7.0–26.0)
CALCIUM: 9.7 mg/dL (ref 8.4–10.4)
CHLORIDE: 107 meq/L (ref 98–109)
CO2: 21 meq/L — AB (ref 22–29)
Creatinine: 0.9 mg/dL (ref 0.6–1.1)
Glucose: 133 mg/dl (ref 70–140)
POTASSIUM: 4 meq/L (ref 3.5–5.1)
Sodium: 140 mEq/L (ref 136–145)
TOTAL PROTEIN: 7.2 g/dL (ref 6.4–8.3)
Total Bilirubin: 0.33 mg/dL (ref 0.20–1.20)

## 2014-04-29 NOTE — Telephone Encounter (Signed)
per pof to sch appt-cancel pt chemo-resume in 1week-sent emailt o MW to sch chemo for 6/4 after NP appt-sch MRI-gave pt copy of sch-adv will call with chemo time on 5/29-

## 2014-04-29 NOTE — Progress Notes (Signed)
Hematology and Oncology Follow Up Visit  Tamara [name] [id number] 03-23-1971 43 y.o. 05/02/2014 10:59 PM     Principal Diagnosis:Tamara [name] 43 y.o. 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     Prior Therapy:  #1 The patient palpated a left breast mass in the upper outer quadrant. Mammogram showed a 3.2 cm mass in the 2:00 position 7 cm from the nipple. There was also noted to be an 8 mm mass 8 cm from the nipple in the ipsilateral breast. She was also noted to have positive lymph nodes. She had a biopsy of both masses performed as well as the lymph node. 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 MRI confirmed 2.8 and 1.3 cm mass is as well as lymph node. On the right he was noted to have a 6 mm nodule. This has been biopsied and is negative.   #2 staging scans: PET scan  1. Hypermetabolic left breast mass consistent with known neoplasm.  There are also FDG positive left axillary lymph nodes.  2. Benign-appearing brown fat activity and muscular activity in the  neck and chest but no findings for metastatic disease involving the  neck, chest, abdomen or pelvis.  3. No findings for metastatic bone disease.  4. Moderate FDG activity in the endometrial canal is likely due to  secretory phase of ovulation or menses. No mass is seen.  5. Uterine fibroids  CT SCAN:  1. 3 cm left breast mass corresponding to the patient's known  neoplasm. Enlarged left axillary lymph nodes are positive on the PET  scan.  2. No CT findings for metastatic disease involving the chest,  abdomen or pelvis and no evidence of osseous metastatic disease.  3. Mildly enlarged fibroid uterus.   #3 genetic testing: Patient is a BRCA1 mutation carrier   #4 echocardiogram:  02/19/2014: Left ventricular ejection  fraction 45-50%. Patient was seen by cardiology and they have cleared her for chemotherapy with anti-HER-2-based regimen   #5 neoadjuvant chemotherapy: Patient recommended Taxotere carboplatinum Herceptin and perjeta every 3 weeks for a total of 6 cycles. She started this therapy on 02/25/14.   Current therapy:  Neoadjuvant Taxotere, Carboplatin, Herceptin, Perjeta cycle 4 day 1   Interim History: Tamara [name] 43 y.o. female with stage II, HER-2/neu positive, invasive ductal carcinoma of the left breast.  She is here for evaluation prior to receiving cycle 4 of 6 planned treatments of Taxotere, Carboplatin, Herceptin, and Perjeta.  She receives this treatment on day 1 of a 21 day cycle with Neulasta support given on day 2.    She is cycle 4 day 1.  She is doing well today.  She has had two loose bowel movements today.  She ate pancakes for dinner last night and a chicken fajita omelette today that may have contributed to it.  She is noticing some darkening at the base of her thumb nail beds.  She denies fevers, chills, nausea, vomiting, constipation, numbness/tingling, mouth sores, or any further concerns.    Medications:  Current Outpatient Prescriptions  Medication Sig Dispense Refill  . dexamethasone (DECADRON) 4 MG tablet Take 2 tablets (8 mg total) by mouth 2 (two) times daily. Start the day before Taxotere. Then again the day after chemo for 3 days.  30 tablet  1  . ferrous sulfate 325 (65 FE) MG EC tablet  Take 1 tablet (325 mg total) by mouth 3 (three) times daily with meals.  90 tablet  3  . lidocaine-prilocaine (EMLA) cream Apply 1 application topically as needed (For port-a-cath.).      Marland Kitchen loratadine (CLARITIN) 10 MG tablet Take 10 mg by mouth daily as needed (For inflammation after receiving her injections during chemo.).       Marland Kitchen PRESCRIPTION MEDICATION She receives her chemo treatments at the [facility]. Her oncologist is Dr. Humphrey Rolls. She received her last chemo  treatments of Taxotere 139m Injection, Carboplatin 845m Herceptin 52533mnd Perjeta 420m40m 04/08/14. She also received Neulasta 6mg 75mection on 04/09/14.      . LORMarland Kitchenzepam (ATIVAN) 0.5 MG tablet Take 1 tablet (0.5 mg total) by mouth every 6 (six) hours as needed (Nausea or vomiting).  30 tablet  0  . ondansetron (ZOFRAN) 8 MG tablet Take 1 tablet (8 mg total) by mouth 2 (two) times daily. Start the day after chemo for 3 days. Then take as needed for nausea or vomiting.  30 tablet  1  . oxyCODONE-acetaminophen (PERCOCET/ROXICET) 5-325 MG per tablet Take 1 tablet by mouth every 4 (four) hours as needed for severe pain.       . proMarland Kitchenhlorperazine (COMPAZINE) 10 MG tablet Take 1 tablet (10 mg total) by mouth every 6 (six) hours as needed (Nausea or vomiting).  30 tablet  1  . promethazine (PHENERGAN) 25 MG tablet Take 1 tablet (25 mg total) by mouth every 6 (six) hours as needed for nausea or vomiting.  30 tablet  0   No current facility-administered medications for this visit.     Allergies: No Known Allergies  Medical History: Past Medical History  Diagnosis Date  . Endometriosis   . Hypertension   . Rash     fine rash on abd  . Anemia   . Wears glasses   . 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 Iron deficiency anemia, unspecified 02/25/2014  . BRCA1 positive     c.190T>G (p.Cys64Gly) @ [facility]     Surgical History:  Past Surgical History  Procedure Laterality Date  . Cervical polypectomy  2010  . Cesarean section      one previous  . Tubal ligation    . Portacath placement Right 02/23/2014    Procedure: INSERTION PORT-A-CATH;  Surgeon: ThomaJoyice Fasternett, MD;  [facility];  Laterality: Right;     Review of Systems: A 10 point review of systems was conducted and is otherwise negative except for what is noted above.    Physical Exam: Blood pressure 144/92, pulse 96, temperature 98.3 F (36.8 C), temperature source Oral, resp. rate 18,  height 5' 5"  (1.651 m), weight 194 lb 1.6 oz (88.043 kg), last menstrual period 04/04/2014. GENERAL: Patient is a well appearing female in no acute distress HEENT:  Sclerae anicteric.  Oropharynx clear and moist. No ulcerations or evidence of oropharyngeal candidiasis. Neck is supple.  NODES:  No cervical, supraclavicular, or axillary lymphadenopathy palpated.  BREAST EXAM:  Left breast mass about 0.5cm, soft LUNGS:  Clear to auscultation bilaterally.  No wheezes or rhonchi. HEART:  Regular rate and rhythm. No murmur appreciated. ABDOMEN:  Soft, nontender.  Positive, normoactive bowel sounds. No organomegaly palpated. MSK:  No focal spinal tenderness to palpation. Full range of motion bilaterally in the upper extremities. EXTREMITIES:  No peripheral edema.   SKIN:  Clear with no obvious rashes or skin changes.  No nail dyscrasia. Right chest wall port is erythematous, tender, warm and slightly swollen.   NEURO:  Nonfocal. Well oriented.  Appropriate affect. ECOG PERFORMANCE STATUS: 1 - Symptomatic but completely ambulatory   Lab Results: Lab Results  Component Value Date   WBC 7.5 04/29/2014   HGB 10.0* 04/29/2014   HCT 30.3* 04/29/2014   MCV 86.3 04/29/2014   PLT 149 04/29/2014     Chemistry      Component Value Date/Time   NA 140 04/29/2014 1059   NA 142 04/13/2014 1630   K 4.0 04/29/2014 1059   K 4.1 04/13/2014 1630   CL 103 04/13/2014 1630   CO2 21* 04/29/2014 1059   CO2 28 04/13/2014 1630   BUN 20.0 04/29/2014 1059   BUN 21 04/13/2014 1630   CREATININE 0.9 04/29/2014 1059   CREATININE 0.88 04/13/2014 1630      Component Value Date/Time   CALCIUM 9.7 04/29/2014 1059   CALCIUM 9.1 04/13/2014 1630   ALKPHOS 71 04/29/2014 1059   ALKPHOS 147* 04/13/2014 1630   AST 15 04/29/2014 1059   AST 16 04/13/2014 1630   ALT 17 04/29/2014 1059   ALT 17 04/13/2014 1630   BILITOT 0.33 04/29/2014 1059   BILITOT <0.2* 04/13/2014 1630      Assessment and Plan: [name] 43 y.o. female with  1. Stage II  HER-2/neu positive invasive ductal carcinoma of the left breast.  Please see prior history above.  Her labs are stable today.    2. Nausea and dehydration:  [name] nausea was improved with the addition of the Fosaprepitant and Aloxi, however, after going to the ED and being diagnosed with pneumonia and started on Levaquin, she has had an increase in nausea and has not been drinking a lot of fluids.  She will return this afternoon for IV hydration and Zofran.    3. Port infection. [name] port skin separation is being managed by Dr. Brantley Stage.  She took all antibiotics as prescribed and was cleared to use her port today.    5. Cardiac:  02/19/2014: Left ventricular ejection fraction 45-50%. Patient was seen by cardiology and they have cleared her for chemotherapy with anti-HER-2-based regimen.  Her LVEF was decreased and she has not yet followed up with cardio-onc as requested a couple of weeks ago.  Due to this we will hold her treatment today and await echo and evaluation by cardio-onc.    The patient will return in one week for labs, evaluation and possible treatment.  She knows to call us in the interim for any questions or concerns.  We can certainly see her sooner if needed.  I spent 25 minutes counseling the patient face to face.  The total time spent in the appointment was 30 minutes.  Minette Headland, [place] [phone] 05/02/2014 10:59 PM

## 2014-04-30 ENCOUNTER — Telehealth: Payer: Self-pay | Admitting: *Deleted

## 2014-04-30 ENCOUNTER — Ambulatory Visit: Payer: Medicaid Other

## 2014-04-30 NOTE — Telephone Encounter (Signed)
I have tried to schedule appts per POF. Unclear of treatment dates, sent message to APP

## 2014-05-04 ENCOUNTER — Telehealth: Payer: Self-pay | Admitting: *Deleted

## 2014-05-04 NOTE — Telephone Encounter (Signed)
Per staff message I have adjusted appts and called patient

## 2014-05-05 ENCOUNTER — Encounter (HOSPITAL_COMMUNITY): Payer: Self-pay | Admitting: Cardiology

## 2014-05-05 ENCOUNTER — Encounter: Payer: Self-pay | Admitting: Oncology

## 2014-05-05 ENCOUNTER — Ambulatory Visit (HOSPITAL_BASED_OUTPATIENT_CLINIC_OR_DEPARTMENT_OTHER)
Admission: RE | Admit: 2014-05-05 | Discharge: 2014-05-05 | Disposition: A | Discharge status: Home / Self Care | Payer: Medicaid Other | Source: Ambulatory Visit | Attending: Internal Medicine | Admitting: Internal Medicine

## 2014-05-05 ENCOUNTER — Ambulatory Visit (HOSPITAL_COMMUNITY)
Admission: RE | Admit: 2014-05-05 | Discharge: 2014-05-05 | Disposition: A | Discharge status: Home / Self Care | Payer: Medicaid Other | Source: Ambulatory Visit | Attending: Internal Medicine | Admitting: Internal Medicine

## 2014-05-05 ENCOUNTER — Telehealth: Payer: Self-pay | Admitting: Internal Medicine

## 2014-05-05 VITALS — BP 134/86 | HR 82 | Wt 198.5 lb

## 2014-05-05 DIAGNOSIS — I369 Nonrheumatic tricuspid valve disorder, unspecified: Secondary | ICD-10-CM

## 2014-05-05 DIAGNOSIS — Z1501 Genetic susceptibility to malignant neoplasm of breast: Secondary | ICD-10-CM | POA: Diagnosis not present

## 2014-05-05 DIAGNOSIS — C50412 Malignant neoplasm of upper-outer quadrant of left female breast: Secondary | ICD-10-CM

## 2014-05-05 DIAGNOSIS — Z1509 Genetic susceptibility to other malignant neoplasm: Principal | ICD-10-CM

## 2014-05-05 DIAGNOSIS — R002 Palpitations: Secondary | ICD-10-CM

## 2014-05-05 DIAGNOSIS — I509 Heart failure, unspecified: Secondary | ICD-10-CM | POA: Insufficient documentation

## 2014-05-05 DIAGNOSIS — C50419 Malignant neoplasm of upper-outer quadrant of unspecified female breast: Secondary | ICD-10-CM | POA: Diagnosis not present

## 2014-05-05 DIAGNOSIS — I10 Essential (primary) hypertension: Secondary | ICD-10-CM | POA: Diagnosis not present

## 2014-05-05 MED ORDER — CARVEDILOL 3.125 MG PO TABS: 3.1250 mg | ORAL_TABLET | Freq: Two times a day (BID) | ORAL | Status: DC

## 2014-05-05 NOTE — Addendum Note (Signed)
Encounter addended by: Kerry Dory, CMA on: 05/05/2014 10:33 AM<BR>     Documentation filed: Visit Diagnoses, Patient Instructions Section, Orders

## 2014-05-05 NOTE — Progress Notes (Signed)
Put husband's fmla form on nurse's desk. °

## 2014-05-05 NOTE — Progress Notes (Signed)
Put husband's fmla form in registration desk. °

## 2014-05-05 NOTE — Progress Notes (Signed)
Patient ID: Tamara [name], female   DOB: 04/01/1971, 42 y.o.   MRN: [id number] Referring Physician: Dr. Khan Primary Care: [facility] Primary Cardiologist: N/A  HPI: 42 yo with a history of HTN and chronic anemia. She was recently diagnosed with L breast cancer. 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 Lymph node was positive for metastatic disease.  Will be treated with Taxotere and carboplatinum/Herceptin/perjeta to be given every 3 weeks for a total of 6 cycles. Once patient completes chemotherapy she will receive Herceptin every 3 weeks to finish out 1 year of Herceptin  She returns for f/u. Baseline EF was noted to be a little low but other parameters were normal so we proceeded with Herceptin/perjeta. Has gotten 3 rounds to date. Occasional palpitations and exertional dyspnea but stable. No edema.   ECHO 02/19/2014: EF 45-50%, lat s ' 9.75, GS -16.7% (Baseline prior to chemo) ECHO 05/05/14 : EF 45% lat s' 11.5 cm/sec GLS - 17.8%'  SH: Does not smoke or drink FH: Mother diagnosed breast CA at age 57, living        2 paternal aunts - breast cancer (1 deceased and 1 living)        Father- Deceased at age 55 of cancer not sure what kind        - Has 41 yo, 41 yo, 41 yo, 41 yo and 41 yo (3 boys and 2 girls)  Review of Systems: [y] = yes, [ ] = no   General: Weight gain [ ]; Weight loss [ ]; Anorexia [ ]; Fatigue [ ]; Fever [ ]; Chills [ ]; Weakness [ ]  Cardiac: Chest pain/pressure [N ]; Resting SOB [N ]; Exertional SOB [ N]; Orthopnea [N ]; Pedal Edema [N ]; Palpitations [y ]; Syncope [ ]; Presyncope [ ]; Paroxysmal nocturnal dyspnea[ ]  Pulmonary: Cough [ ]; Wheezing[ ]; Hemoptysis[ ]; Sputum [ ]; Snoring [ ]  GI: Vomiting[ ]; Dysphagia[ ]; Melena[ ]; Hematochezia [ ]; Heartburn[ ]; Abdominal pain [ ]; Constipation [ ]; Diarrhea [ ]; BRBPR [ ]  GU: Hematuria[ ];  Dysuria [ ]; Nocturia[ ]  Vascular: Pain in legs with walking [ ]; Pain in feet with lying flat [ ]; Non-healing sores [ ]; Stroke [ ]; TIA [ ]; Slurred speech [ ];  Neuro: Headaches[ ]; Vertigo[ ]; Seizures[ ]; Paresthesias[ ];Blurred vision [ ]; Diplopia [ ]; Vision changes [ ]  Ortho/Skin: Arthritis [ ]; Joint pain [N ]; Muscle pain [ ]; Joint swelling [ ]; Back Pain [ ]; Rash [ ]  Psych: Depression[ N]; Anxiety[ ]  Heme: Bleeding problems [ ]; Clotting disorders [ ]; Anemia [ ]  Endocrine: Diabetes [ ]; Thyroid dysfunction[ ]   Past Medical History  Diagnosis Date  . Endometriosis   . Hypertension   . Rash     fine rash on abd  . Anemia   . Wears glasses   . 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 Iron deficiency anemia, unspecified 02/25/2014  . BRCA1 positive     c.190T>G (p.Cys64Gly) @ [facility]     Current Outpatient Prescriptions  Medication Sig Dispense Refill  . dexamethasone (DECADRON) 4 MG tablet Take 2 tablets (8 mg total) by mouth 2 (two) times daily. Start the day before Taxotere. Then again the day after chemo for 3 days.  30 tablet  1  .   ferrous sulfate 325 (65 FE) MG EC tablet Take 1 tablet (325 mg total) by mouth 3 (three) times daily with meals.  90 tablet  3  . lidocaine-prilocaine (EMLA) cream Apply 1 application topically as needed (For port-a-cath.).      . loratadine (CLARITIN) 10 MG tablet Take 10 mg by mouth daily as needed (For inflammation after receiving her injections during chemo.).       . LORazepam (ATIVAN) 0.5 MG tablet Take 1 tablet (0.5 mg total) by mouth every 6 (six) hours as needed (Nausea or vomiting).  30 tablet  0  . ondansetron (ZOFRAN) 8 MG tablet Take 1 tablet (8 mg total) by mouth 2 (two) times daily. Start the day after chemo for 3 days. Then take as needed for nausea or vomiting.  30 tablet  1  . oxyCODONE-acetaminophen (PERCOCET/ROXICET) 5-325 MG per tablet Take 1 tablet by mouth every 4 (four) hours as needed for severe pain.       .  PRESCRIPTION MEDICATION She receives her chemo treatments at the [facility]. Her oncologist is Dr. Khan. She received her last chemo treatments of Taxotere 150mg Injection, Carboplatin 840mg, Herceptin 525mg and Perjeta 420mg on 04/08/14. She also received Neulasta 6mg Injection on 04/09/14.      . prochlorperazine (COMPAZINE) 10 MG tablet Take 1 tablet (10 mg total) by mouth every 6 (six) hours as needed (Nausea or vomiting).  30 tablet  1  . promethazine (PHENERGAN) 25 MG tablet Take 1 tablet (25 mg total) by mouth every 6 (six) hours as needed for nausea or vomiting.  30 tablet  0   No current facility-administered medications for this encounter.    No Known Allergies  History   Social History  . Marital Status: Legally Separated    Spouse Name: N/A    Number of Children: 5  . Years of Education: N/A   Occupational History  . Not on file.   Social History Main Topics  . Smoking status: Former Smoker -- 0.25 packs/day for 15 years    Quit date: 02/18/2009  . Smokeless tobacco: Former User  . Alcohol Use: Yes     Comment: occasional  . Drug Use: No  . Sexual Activity: Not Currently    Birth Control/ Protection: Surgical   Other Topics Concern  . Not on file   Social History Narrative  . No narrative on file    Family History  Problem Relation Age of Onset  . Breast cancer Mother 56    currently 59  . Diabetes Father   . Pancreatic cancer Father 55  . Breast cancer Paternal Aunt 48    currently 60; BRCA1 positive  . Stroke Maternal Grandfather   . Cancer Paternal Aunt     unk. primary; deceased 70s  . Breast cancer Cousin     daughter of unaffected paternal aunt; dx in her 40s     Filed Vitals:   05/05/14 0942  BP: 134/86  Pulse: 82  Weight: 198 lb 8 oz (90.039 kg)  SpO2: 98%   PHYSICAL EXAM: General:  Well appearing. No respiratory difficulty HEENT: normal Neck: supple. no JVD. Carotids 2+ bilat; no bruits. No lymphadenopathy or  thryomegaly appreciated. Cor: PMI nondisplaced. Regular rate & rhythm. No rubs, gallops or murmurs. Lungs: clear Abdomen: soft, nontender, nondistended. No hepatosplenomegaly. No bruits or masses. Good bowel sounds. Extremities: no cyanosis, clubbing, rash, edema Neuro: alert & oriented x 3, cranial nerves grossly intact. moves all 4   extremities w/o difficulty. Affect pleasant.  ASSESSMENT & PLAN:  1) Breast Cancer L: HER-2/neu positive with a proliferation marker Ki-67 79%.   2) HTN - Newly diagnosed. Stable, continue HCTZ.  3) Palpitations. Occasional likely PVCs/PACS  I have reviewed echo personally. EF remains on the low side of normal or minimally depressed but other parameters look good. These are all stable and precede the initiation of chemotherapy. I do not think she has Herceptin toxicity at this point and she can continue therapy. Given her EF, HTN and palpitations will start low-dose carvedilol for cardioprotection. We will follow very closely during chemo with echo q 70month for now.   DJolaine ArtistMD 10:18 AM

## 2014-05-05 NOTE — Telephone Encounter (Signed)
Pt has come in today to ask if she can receive documentation about her treatment for chemo; please f/u with pt about the details of the letter @[phone]

## 2014-05-05 NOTE — Patient Instructions (Signed)
START Coreg 3.125 mg twice a day  Your physician recommends that you schedule a follow-up appointment in: 2 months  Your physician has requested that you have an echocardiogram. Echocardiography is a painless test that uses sound waves to create images of your heart. It provides your doctor with information about the size and shape of your heart and how well your heart's chambers and valves are working. This procedure takes approximately one hour. There are no restrictions for this procedure.

## 2014-05-05 NOTE — Progress Notes (Signed)
Echo Lab  2D Echocardiogram completed.  [place], RDCS 05/05/2014 9:38 AM

## 2014-05-06 ENCOUNTER — Telehealth: Payer: Self-pay | Admitting: *Deleted

## 2014-05-06 ENCOUNTER — Ambulatory Visit (HOSPITAL_BASED_OUTPATIENT_CLINIC_OR_DEPARTMENT_OTHER): Payer: Medicaid Other

## 2014-05-06 ENCOUNTER — Telehealth: Payer: Self-pay | Admitting: Adult Health

## 2014-05-06 ENCOUNTER — Ambulatory Visit (HOSPITAL_BASED_OUTPATIENT_CLINIC_OR_DEPARTMENT_OTHER): Payer: Medicaid Other | Admitting: Adult Health

## 2014-05-06 ENCOUNTER — Other Ambulatory Visit: Payer: Self-pay | Admitting: Oncology

## 2014-05-06 ENCOUNTER — Other Ambulatory Visit: Payer: Self-pay | Admitting: *Deleted

## 2014-05-06 ENCOUNTER — Other Ambulatory Visit (HOSPITAL_BASED_OUTPATIENT_CLINIC_OR_DEPARTMENT_OTHER): Payer: Medicaid Other

## 2014-05-06 ENCOUNTER — Encounter: Payer: Self-pay | Admitting: Adult Health

## 2014-05-06 VITALS — BP 152/92 | HR 80 | Temp 98.3°F | Resp 18 | Ht 65.0 in | Wt 195.5 lb

## 2014-05-06 DIAGNOSIS — Z5112 Encounter for antineoplastic immunotherapy: Secondary | ICD-10-CM

## 2014-05-06 DIAGNOSIS — Z5111 Encounter for antineoplastic chemotherapy: Secondary | ICD-10-CM

## 2014-05-06 DIAGNOSIS — C50419 Malignant neoplasm of upper-outer quadrant of unspecified female breast: Secondary | ICD-10-CM

## 2014-05-06 DIAGNOSIS — C50412 Malignant neoplasm of upper-outer quadrant of left female breast: Secondary | ICD-10-CM

## 2014-05-06 DIAGNOSIS — C773 Secondary and unspecified malignant neoplasm of axilla and upper limb lymph nodes: Secondary | ICD-10-CM

## 2014-05-06 DIAGNOSIS — E860 Dehydration: Secondary | ICD-10-CM

## 2014-05-06 DIAGNOSIS — R110 Nausea: Secondary | ICD-10-CM

## 2014-05-06 DIAGNOSIS — Z170 Estrogen receptor positive status [ER+]: Secondary | ICD-10-CM

## 2014-05-06 LAB — COMPREHENSIVE METABOLIC PANEL (CC13)
ALT: 13 U/L (ref 0–55)
AST: 12 U/L (ref 5–34)
Albumin: 3.7 g/dL (ref 3.5–5.0)
Alkaline Phosphatase: 67 U/L (ref 40–150)
Anion Gap: 13 mEq/L — ABNORMAL HIGH (ref 3–11)
BILIRUBIN TOTAL: 0.27 mg/dL (ref 0.20–1.20)
BUN: 12.1 mg/dL (ref 7.0–26.0)
CO2: 20 mEq/L — ABNORMAL LOW (ref 22–29)
CREATININE: 0.8 mg/dL (ref 0.6–1.1)
Calcium: 9.6 mg/dL (ref 8.4–10.4)
Chloride: 108 mEq/L (ref 98–109)
Glucose: 140 mg/dl (ref 70–140)
Potassium: 3.5 mEq/L (ref 3.5–5.1)
Sodium: 142 mEq/L (ref 136–145)
Total Protein: 7 g/dL (ref 6.4–8.3)

## 2014-05-06 LAB — CBC WITH DIFFERENTIAL/PLATELET
BASO%: 0.5 % (ref 0.0–2.0)
Basophils Absolute: 0 10*3/uL (ref 0.0–0.1)
EOS%: 0.1 % (ref 0.0–7.0)
Eosinophils Absolute: 0 10*3/uL (ref 0.0–0.5)
HCT: 32.5 % — ABNORMAL LOW (ref 34.8–46.6)
HGB: 10.7 g/dL — ABNORMAL LOW (ref 11.6–15.9)
LYMPH#: 1.3 10*3/uL (ref 0.9–3.3)
LYMPH%: 14.6 % (ref 14.0–49.7)
MCH: 29.4 pg (ref 25.1–34.0)
MCHC: 33 g/dL (ref 31.5–36.0)
MCV: 88.9 fL (ref 79.5–101.0)
MONO#: 0.4 10*3/uL (ref 0.1–0.9)
MONO%: 4.9 % (ref 0.0–14.0)
NEUT#: 7.1 10*3/uL — ABNORMAL HIGH (ref 1.5–6.5)
NEUT%: 79.9 % — ABNORMAL HIGH (ref 38.4–76.8)
Platelets: 139 10*3/uL — ABNORMAL LOW (ref 145–400)
RBC: 3.66 10*6/uL — ABNORMAL LOW (ref 3.70–5.45)
RDW: 31.2 % — ABNORMAL HIGH (ref 11.2–14.5)
WBC: 8.9 10*3/uL (ref 3.9–10.3)

## 2014-05-06 MED ORDER — FOSAPREPITANT DIMEGLUMINE INJECTION 150 MG
150.0000 mg | Freq: Once | INTRAVENOUS | Status: AC
  Administered 2014-05-06: 150 mg via INTRAVENOUS
  Filled 2014-05-06: qty 5

## 2014-05-06 MED ORDER — DIPHENHYDRAMINE HCL 25 MG PO CAPS
50.0000 mg | ORAL_CAPSULE | Freq: Once | ORAL | Status: AC
  Administered 2014-05-06: 50 mg via ORAL

## 2014-05-06 MED ORDER — PALONOSETRON HCL INJECTION 0.25 MG/5ML
0.2500 mg | Freq: Once | INTRAVENOUS | Status: AC
  Administered 2014-05-06: 0.25 mg via INTRAVENOUS

## 2014-05-06 MED ORDER — ACETAMINOPHEN 325 MG PO TABS
650.0000 mg | ORAL_TABLET | Freq: Once | ORAL | Status: AC
  Administered 2014-05-06: 650 mg via ORAL

## 2014-05-06 MED ORDER — HEPARIN SOD (PORK) LOCK FLUSH 100 UNIT/ML IV SOLN
500.0000 [IU] | Freq: Once | INTRAVENOUS | Status: DC | PRN
  Filled 2014-05-06: qty 5

## 2014-05-06 MED ORDER — SODIUM CHLORIDE 0.9 % IV SOLN: Freq: Once | INTRAVENOUS | Status: DC

## 2014-05-06 MED ORDER — DEXAMETHASONE SODIUM PHOSPHATE 20 MG/5ML IJ SOLN
12.0000 mg | Freq: Once | INTRAMUSCULAR | Status: AC
  Administered 2014-05-06: 12 mg via INTRAVENOUS

## 2014-05-06 MED ORDER — SODIUM CHLORIDE 0.9 % IV SOLN
6.0000 mg/kg | Freq: Once | INTRAVENOUS | Status: AC
  Administered 2014-05-06: 525 mg via INTRAVENOUS
  Filled 2014-05-06: qty 25

## 2014-05-06 MED ORDER — DIPHENHYDRAMINE HCL 25 MG PO CAPS
ORAL_CAPSULE | ORAL | Status: AC
  Filled 2014-05-06: qty 2

## 2014-05-06 MED ORDER — HEPARIN SOD (PORK) LOCK FLUSH 100 UNIT/ML IV SOLN
500.0000 [IU] | Freq: Once | INTRAVENOUS | Status: AC | PRN
  Administered 2014-05-06: 500 [IU]
  Filled 2014-05-06: qty 5

## 2014-05-06 MED ORDER — SODIUM CHLORIDE 0.9 % IJ SOLN
10.0000 mL | INTRAMUSCULAR | Status: DC | PRN
  Filled 2014-05-06: qty 10

## 2014-05-06 MED ORDER — SODIUM CHLORIDE 0.9 % IV SOLN
843.6000 mg | Freq: Once | INTRAVENOUS | Status: AC
  Administered 2014-05-06: 840 mg via INTRAVENOUS
  Filled 2014-05-06: qty 84

## 2014-05-06 MED ORDER — SODIUM CHLORIDE 0.9 % IV SOLN
Freq: Once | INTRAVENOUS | Status: AC
  Administered 2014-05-06: 11:00:00 via INTRAVENOUS

## 2014-05-06 MED ORDER — ACETAMINOPHEN 325 MG PO TABS
ORAL_TABLET | ORAL | Status: AC
  Filled 2014-05-06: qty 2

## 2014-05-06 MED ORDER — DOCETAXEL CHEMO INJECTION 160 MG/16ML
75.0000 mg/m2 | Freq: Once | INTRAVENOUS | Status: AC
  Administered 2014-05-06: 150 mg via INTRAVENOUS
  Filled 2014-05-06: qty 15

## 2014-05-06 MED ORDER — DEXAMETHASONE SODIUM PHOSPHATE 20 MG/5ML IJ SOLN
INTRAMUSCULAR | Status: AC
  Filled 2014-05-06: qty 5

## 2014-05-06 MED ORDER — SODIUM CHLORIDE 0.9 % IV SOLN
Freq: Once | INTRAVENOUS | Status: AC
  Administered 2014-05-06: 15:00:00 via INTRAVENOUS

## 2014-05-06 MED ORDER — SODIUM CHLORIDE 0.9 % IV SOLN
420.0000 mg | Freq: Once | INTRAVENOUS | Status: AC
  Administered 2014-05-06: 420 mg via INTRAVENOUS
  Filled 2014-05-06: qty 14

## 2014-05-06 MED ORDER — SODIUM CHLORIDE 0.9 % IJ SOLN
10.0000 mL | INTRAMUSCULAR | Status: DC | PRN
  Administered 2014-05-06: 10 mL
  Filled 2014-05-06: qty 10

## 2014-05-06 NOTE — Telephone Encounter (Signed)
, °

## 2014-05-06 NOTE — Telephone Encounter (Signed)
Per staff message and POF I have scheduled appts.  JMW  

## 2014-05-06 NOTE — Progress Notes (Signed)
Hematology and Oncology Follow Up Visit  Tamara [name] [id number] Aug 22, 1971 43 y.o. 05/10/2014 2:55 PM     Principal Diagnosis:Tamara [name] 43 y.o. 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     Prior Therapy:  #1 The patient palpated a left breast mass in the upper outer quadrant. Mammogram showed a 3.2 cm mass in the 2:00 position 7 cm from the nipple. There was also noted to be an 8 mm mass 8 cm from the nipple in the ipsilateral breast. She was also noted to have positive lymph nodes. She had a biopsy of both masses performed as well as the lymph node. 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 MRI confirmed 2.8 and 1.3 cm mass is as well as lymph node. On the right he was noted to have a 6 mm nodule. This has been biopsied and is negative.   #2 staging scans: PET scan  1. Hypermetabolic left breast mass consistent with known neoplasm.  There are also FDG positive left axillary lymph nodes.  2. Benign-appearing brown fat activity and muscular activity in the  neck and chest but no findings for metastatic disease involving the  neck, chest, abdomen or pelvis.  3. No findings for metastatic bone disease.  4. Moderate FDG activity in the endometrial canal is likely due to  secretory phase of ovulation or menses. No mass is seen.  5. Uterine fibroids  CT SCAN:  1. 3 cm left breast mass corresponding to the patient's known  neoplasm. Enlarged left axillary lymph nodes are positive on the PET  scan.  2. No CT findings for metastatic disease involving the chest,  abdomen or pelvis and no evidence of osseous metastatic disease.  3. Mildly enlarged fibroid uterus.   #3 genetic testing: Patient is a BRCA1 mutation carrier   #4 echocardiogram:  02/19/2014: Left ventricular ejection  fraction 45-50%. Patient was seen by cardiology and they have cleared her for chemotherapy with anti-HER-2-based regimen   #5 neoadjuvant chemotherapy: Patient recommended Taxotere carboplatinum Herceptin and perjeta every 3 weeks for a total of 6 cycles. She started this therapy on 02/25/14.   Current therapy:  Neoadjuvant Taxotere, Carboplatin, Herceptin, Perjeta cycle 4 day 1   Interim History: Tamara [name] 43 y.o. female with stage II, HER-2/neu positive, invasive ductal carcinoma of the left breast.  She is here for evaluation prior to receiving cycle 4 of 6 planned treatments of Taxotere, Carboplatin, Herceptin, and Perjeta.  She receives this treatment on day 1 of a 21 day cycle with Neulasta support given on day 2.    She is cycle 4 day 1.  She is doing well today.  We did hold her treatment last week to await a cardiology appointment and evaluation.  She was cleared to continue treatment.  She is doing well today.  She denies fevers, chills, nausea, vomiting, constipation, diarrhea, numbness, or any further concerns.   Medications:  Current Outpatient Prescriptions  Medication Sig Dispense Refill  . carvedilol (COREG) 3.125 MG tablet Take 1 tablet (3.125 mg total) by mouth 2 (two) times daily with a meal.  60 tablet  6  . dexamethasone (DECADRON) 4 MG tablet Take 2 tablets (8 mg total) by mouth 2 (two) times daily. Start the day before Taxotere. Then again the day after chemo for 3 days.  30 tablet  1  .  ferrous sulfate 325 (65 FE) MG EC tablet Take 1 tablet (325 mg total) by mouth 3 (three) times daily with meals.  90 tablet  3  . lidocaine-prilocaine (EMLA) cream Apply 1 application topically as needed (For port-a-cath.).      Marland Kitchen PRESCRIPTION MEDICATION She receives her chemo treatments at the [facility]. Her oncologist is Dr. Humphrey Rolls. She received her last chemo treatments of Taxotere 161m Injection, Carboplatin 8485m Herceptin 52525mnd Perjeta 420m18m 04/08/14.  She also received Neulasta 6mg 73mection on 04/09/14.      . lorMarland Kitchentadine (CLARITIN) 10 MG tablet Take 10 mg by mouth daily as needed (For inflammation after receiving her injections during chemo.).       . LORMarland Kitchenzepam (ATIVAN) 0.5 MG tablet Take 1 tablet (0.5 mg total) by mouth every 6 (six) hours as needed (Nausea or vomiting).  30 tablet  0  . ondansetron (ZOFRAN) 8 MG tablet Take 1 tablet (8 mg total) by mouth 2 (two) times daily. Start the day after chemo for 3 days. Then take as needed for nausea or vomiting.  30 tablet  1  . oxyCODONE-acetaminophen (PERCOCET/ROXICET) 5-325 MG per tablet Take 1 tablet by mouth every 4 (four) hours as needed for severe pain.       . proMarland Kitchenhlorperazine (COMPAZINE) 10 MG tablet Take 1 tablet (10 mg total) by mouth every 6 (six) hours as needed (Nausea or vomiting).  30 tablet  1  . promethazine (PHENERGAN) 25 MG tablet Take 1 tablet (25 mg total) by mouth every 6 (six) hours as needed for nausea or vomiting.  30 tablet  0   No current facility-administered medications for this visit.     Allergies: No Known Allergies  Medical History: Past Medical History  Diagnosis Date  . Endometriosis   . Hypertension   . Rash     fine rash on abd  . Anemia   . Wears glasses   . 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 Iron deficiency anemia, unspecified 02/25/2014  . BRCA1 positive     c.190T>G (p.Cys64Gly) @ [facility]     Surgical History:  Past Surgical History  Procedure Laterality Date  . Cervical polypectomy  2010  . Cesarean section      one previous  . Tubal ligation    . Portacath placement Right 02/23/2014    Procedure: INSERTION PORT-A-CATH;  Surgeon: ThomaJoyice Fasternett, MD;  [facility];  Laterality: Right;     Review of Systems: A 10 point review of systems was conducted and is otherwise negative except for what is noted above.    Physical Exam: Blood pressure 152/92, pulse 80, temperature 98.3 F (36.8  C), temperature source Oral, resp. rate 18, height 5' 5"  (1.651 m), weight 195 lb 8 oz (88.678 kg), last menstrual period 04/04/2014. GENERAL: Patient is a well appearing female in no acute distress HEENT:  Sclerae anicteric.  Oropharynx clear and moist. No ulcerations or evidence of oropharyngeal candidiasis. Neck is supple.  NODES:  No cervical, supraclavicular, or axillary lymphadenopathy palpated.  BREAST EXAM:  Left breast mass about 0.5cm, soft LUNGS:  Clear to auscultation bilaterally.  No wheezes or rhonchi. HEART:  Regular rate and rhythm. No murmur appreciated. ABDOMEN:  Soft, nontender.  Positive, normoactive bowel sounds. No organomegaly palpated. MSK:  No focal spinal tenderness to palpation. Full range of motion bilaterally in the upper extremities. EXTREMITIES:  No peripheral edema.   SKIN:  Clear with no obvious rashes or skin changes. No nail dyscrasia. Right chest wall port is erythematous, tender, warm and slightly swollen.   NEURO:  Nonfocal. Well oriented.  Appropriate affect. ECOG PERFORMANCE STATUS: 1 - Symptomatic but completely ambulatory   Lab Results: Lab Results  Component Value Date   WBC 8.9 05/06/2014   HGB 10.7* 05/06/2014   HCT 32.5* 05/06/2014   MCV 88.9 05/06/2014   PLT 139* 05/06/2014     Chemistry      Component Value Date/Time   NA 142 05/06/2014 0951   NA 142 04/13/2014 1630   K 3.5 05/06/2014 0951   K 4.1 04/13/2014 1630   CL 103 04/13/2014 1630   CO2 20* 05/06/2014 0951   CO2 28 04/13/2014 1630   BUN 12.1 05/06/2014 0951   BUN 21 04/13/2014 1630   CREATININE 0.8 05/06/2014 0951   CREATININE 0.88 04/13/2014 1630      Component Value Date/Time   CALCIUM 9.6 05/06/2014 0951   CALCIUM 9.1 04/13/2014 1630   ALKPHOS 67 05/06/2014 0951   ALKPHOS 147* 04/13/2014 1630   AST 12 05/06/2014 0951   AST 16 04/13/2014 1630   ALT 13 05/06/2014 0951   ALT 17 04/13/2014 1630   BILITOT 0.27 05/06/2014 0951   BILITOT <0.2* 04/13/2014 1630      Assessment and Plan: [name] 43  y.o. female with  1. Stage II HER-2/neu positive invasive ductal carcinoma of the left breast.  Please see prior history above.  Her labs are stable today.  She will proceed with treatment today.    2. Nausea and dehydration: She will continue to receive Emend, Aloxi, and dexamethasone before chemotherapy.  She occasionally requires IV hydration and anti-emetics the week following her treatment.    3. Port infection. This has healed.  She has been cleared to use her port by Dr. Brantley Stage.   5. Cardiac:  05/05/2014: Left ventricular ejection fraction 45%. Patient was seen by cardiology and they have cleared her for chemotherapy with anti-HER-2-based regimen.  Her LVEF was decreased and she has not yet followed up with cardio-onc as requested a couple of weeks ago.  Due to this we will hold her treatment today and await echo and evaluation by cardio-onc.    The patient will return in one week for labs and evaluation of chemotoxicities.  She knows to call us in the interim for any questions or concerns.  We can certainly see her sooner if needed.  I spent 25 minutes counseling the patient face to face.  The total time spent in the appointment was 30 minutes.  Minette Headland, [place] [phone] 05/10/2014 2:55 PM

## 2014-05-07 ENCOUNTER — Ambulatory Visit (HOSPITAL_BASED_OUTPATIENT_CLINIC_OR_DEPARTMENT_OTHER): Payer: Medicaid Other

## 2014-05-07 VITALS — BP 118/66 | HR 73 | Temp 98.3°F

## 2014-05-07 DIAGNOSIS — Z5189 Encounter for other specified aftercare: Secondary | ICD-10-CM

## 2014-05-07 DIAGNOSIS — C50419 Malignant neoplasm of upper-outer quadrant of unspecified female breast: Secondary | ICD-10-CM

## 2014-05-07 DIAGNOSIS — C773 Secondary and unspecified malignant neoplasm of axilla and upper limb lymph nodes: Secondary | ICD-10-CM

## 2014-05-07 DIAGNOSIS — C50412 Malignant neoplasm of upper-outer quadrant of left female breast: Secondary | ICD-10-CM

## 2014-05-07 MED ORDER — PEGFILGRASTIM INJECTION 6 MG/0.6ML
6.0000 mg | Freq: Once | SUBCUTANEOUS | Status: AC
  Administered 2014-05-07: 6 mg via SUBCUTANEOUS
  Filled 2014-05-07: qty 0.6

## 2014-05-09 ENCOUNTER — Other Ambulatory Visit: Payer: Self-pay | Admitting: Oncology

## 2014-05-11 ENCOUNTER — Telehealth: Payer: Self-pay

## 2014-05-11 ENCOUNTER — Emergency Department (HOSPITAL_COMMUNITY): Payer: Medicaid Other

## 2014-05-11 ENCOUNTER — Emergency Department (HOSPITAL_COMMUNITY)
Admission: EM | Admit: 2014-05-11 | Discharge: 2014-05-11 | Disposition: A | Discharge status: Home / Self Care | Payer: Medicaid Other | Attending: Emergency Medicine | Admitting: Emergency Medicine

## 2014-05-11 ENCOUNTER — Encounter (HOSPITAL_COMMUNITY): Payer: Self-pay | Admitting: Emergency Medicine

## 2014-05-11 DIAGNOSIS — Z862 Personal history of diseases of the blood and blood-forming organs and certain disorders involving the immune mechanism: Secondary | ICD-10-CM | POA: Insufficient documentation

## 2014-05-11 DIAGNOSIS — R079 Chest pain, unspecified: Secondary | ICD-10-CM

## 2014-05-11 DIAGNOSIS — R0602 Shortness of breath: Secondary | ICD-10-CM | POA: Insufficient documentation

## 2014-05-11 DIAGNOSIS — Z79899 Other long term (current) drug therapy: Secondary | ICD-10-CM | POA: Insufficient documentation

## 2014-05-11 DIAGNOSIS — I10 Essential (primary) hypertension: Secondary | ICD-10-CM | POA: Insufficient documentation

## 2014-05-11 DIAGNOSIS — R0789 Other chest pain: Secondary | ICD-10-CM | POA: Insufficient documentation

## 2014-05-11 DIAGNOSIS — Z87891 Personal history of nicotine dependence: Secondary | ICD-10-CM | POA: Insufficient documentation

## 2014-05-11 LAB — PRO B NATRIURETIC PEPTIDE: Pro B Natriuretic peptide (BNP): 173.9 pg/mL — ABNORMAL HIGH (ref 0–125)

## 2014-05-11 LAB — TROPONIN I: Troponin I: <0.3 ng/mL (ref <0.30)

## 2014-05-11 LAB — BASIC METABOLIC PANEL
BUN: 27 mg/dL — AB (ref 6–23)
CALCIUM: 9.3 mg/dL (ref 8.4–10.5)
CO2: 27 meq/L (ref 19–32)
CREATININE: 0.83 mg/dL (ref 0.50–1.10)
Chloride: 99 mEq/L (ref 96–112)
GFR calc Af Amer: >90 mL/min (ref >90)
GFR calc non Af Amer: 87 mL/min — ABNORMAL LOW (ref >90)
Glucose, Bld: 99 mg/dL (ref 70–99)
Potassium: 3.5 mEq/L — ABNORMAL LOW (ref 3.7–5.3)
Sodium: 140 mEq/L (ref 137–147)

## 2014-05-11 LAB — CBC
HEMATOCRIT: 33.3 % — AB (ref 36.0–46.0)
HEMOGLOBIN: 10.6 g/dL — AB (ref 12.0–15.0)
MCH: 29.3 pg (ref 26.0–34.0)
MCHC: 31.8 g/dL (ref 30.0–36.0)
MCV: 92 fL (ref 78.0–100.0)
Platelets: 266 10*3/uL (ref 150–400)
RBC: 3.62 MIL/uL — ABNORMAL LOW (ref 3.87–5.11)
RDW: 23.1 % — ABNORMAL HIGH (ref 11.5–15.5)
WBC: 9.2 10*3/uL (ref 4.0–10.5)

## 2014-05-11 LAB — I-STAT TROPONIN, ED: TROPONIN I, POC: 0.02 ng/mL (ref 0.00–0.08)

## 2014-05-11 MED ORDER — POTASSIUM CHLORIDE 20 MEQ/15ML (10%) PO LIQD
40.0000 meq | Freq: Once | ORAL | Status: AC
  Administered 2014-05-11: 40 meq via ORAL
  Filled 2014-05-11: qty 30

## 2014-05-11 MED ORDER — SODIUM CHLORIDE 0.9 % IV SOLN
Freq: Once | INTRAVENOUS | Status: AC
  Administered 2014-05-11: 15:00:00 via INTRAVENOUS

## 2014-05-11 MED ORDER — KETOROLAC TROMETHAMINE 30 MG/ML IJ SOLN
30.0000 mg | Freq: Once | INTRAMUSCULAR | Status: AC
  Administered 2014-05-11: 30 mg via INTRAVENOUS
  Filled 2014-05-11: qty 1

## 2014-05-11 MED ORDER — IOHEXOL 350 MG/ML SOLN
100.0000 mL | Freq: Once | INTRAVENOUS | Status: AC | PRN
  Administered 2014-05-11: 100 mL via INTRAVENOUS

## 2014-05-11 MED ORDER — HEPARIN SOD (PORK) LOCK FLUSH 100 UNIT/ML IV SOLN
500.0000 [IU] | Freq: Once | INTRAVENOUS | Status: AC
  Administered 2014-05-11: 500 [IU]
  Filled 2014-05-11 (×2): qty 5

## 2014-05-11 MED ORDER — IBUPROFEN 600 MG PO TABS: 600.0000 mg | ORAL_TABLET | Freq: Four times a day (QID) | ORAL | Status: DC | PRN

## 2014-05-11 NOTE — Telephone Encounter (Signed)
Returned pt call - chest pain since Saturday 7/10 - "tightness in chest, feels like something stuck" constant, shortness of breath.   Per LC, pt to go to emergency department.  Pt voiced understanding.

## 2014-05-11 NOTE — ED Provider Notes (Signed)
CSN: [id number]     Arrival date & time 05/11/14  1250 History   First MD Initiated Contact with Patient 05/11/14 1349     Chief Complaint  Patient presents with  . Chest Pain  . Shortness of Breath     (Consider location/radiation/quality/duration/timing/severity/associated sxs/prior Treatment) HPI  This is a 43 year old female with a history of hypertension and current breast cancer who presents with chest pain. Patient reports onset of chest pain since Saturday. She reports that it is sharp and anterior.  It is nonradiating. It is always there. It is not worse with deep breathing. Patient reports that at baseline "my breathing is fast."  She denies any recent fevers or cough. She denies any lower extremity swelling. Patient rates pain at 7/10.  Past Medical History  Diagnosis Date  . Endometriosis   . Hypertension   . Rash     fine rash on abd  . Anemia   . Wears glasses   . 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 Iron deficiency anemia, unspecified 02/25/2014  . BRCA1 positive     c.190T>G (p.Cys64Gly) @ [facility]    Past Surgical History  Procedure Laterality Date  . Cervical polypectomy  2010  . Cesarean section      one previous  . Tubal ligation    . Portacath placement Right 02/23/2014    Procedure: INSERTION PORT-A-CATH;  Surgeon: Joyice Faster. Cornett, MD;  Location: [facility];  Service: General;  Laterality: Right;   Family History  Problem Relation Age of Onset  . Breast cancer Mother 60    currently 55  . Diabetes Father   . Pancreatic cancer Father 63  . Breast cancer Paternal Aunt 8    currently 27; BRCA1 positive  . Stroke Maternal Grandfather   . Cancer Paternal Aunt     unk. primary; deceased 49s  . Breast cancer Cousin     daughter of unaffected paternal aunt; dx in her 38s   History  Substance Use Topics  . Smoking status: Former Smoker -- 0.25 packs/day for 15 years    Quit date: 02/18/2009  . Smokeless tobacco: Former Systems developer  .  Alcohol Use: Yes     Comment: occasional   OB History   Grav Para Term Preterm Abortions TAB SAB Ect Mult Living   5 5 5       5      Review of Systems  Constitutional: Negative for fever.  Respiratory: Positive for chest tightness. Negative for cough and shortness of breath.   Cardiovascular: Positive for chest pain. Negative for leg swelling.  Gastrointestinal: Negative for nausea, vomiting and abdominal pain.  Genitourinary: Negative for dysuria.  Musculoskeletal: Negative for back pain.  Skin: Negative for wound.  Neurological: Negative for headaches.  Psychiatric/Behavioral: Negative for confusion.  All other systems reviewed and are negative.     Allergies  Review of patient's allergies indicates no known allergies.  Home Medications   Prior to Admission medications   Medication Sig Start Date End Date Taking? Authorizing Provider  carvedilol (COREG) 3.125 MG tablet Take 1 tablet (3.125 mg total) by mouth 2 (two) times daily with a meal. 05/05/14  Yes Jolaine Artist, MD  dexamethasone (DECADRON) 4 MG tablet Take 2 tablets (8 mg total) by mouth 2 (two) times daily. Start the day before Taxotere. Then again the day after chemo for 3 days. 02/16/14  Yes Deatra Robinson, MD  ferrous sulfate 325 (65 FE) MG EC tablet  Take 1 tablet (325 mg total) by mouth 3 (three) times daily with meals. 03/04/14  Yes Minette Headland, NP  lidocaine-prilocaine (EMLA) cream Apply 1 application topically as needed (For port-a-cath.).   Yes Historical Provider, MD  loratadine (CLARITIN) 10 MG tablet Take 10 mg by mouth daily as needed (For inflammation after receiving her injections during chemo.).    Yes Historical Provider, MD  LORazepam (ATIVAN) 0.5 MG tablet Take 1 tablet (0.5 mg total) by mouth every 6 (six) hours as needed (Nausea or vomiting). 02/16/14  Yes Deatra Robinson, MD  ondansetron (ZOFRAN) 8 MG tablet Take 1 tablet (8 mg total) by mouth 2 (two) times daily. Start the day after chemo for  3 days. Then take as needed for nausea or vomiting. 02/16/14  Yes Deatra Robinson, MD  oxyCODONE-acetaminophen (PERCOCET/ROXICET) 5-325 MG per tablet Take 1 tablet by mouth every 4 (four) hours as needed for severe pain.    Yes Historical Provider, MD  prochlorperazine (COMPAZINE) 10 MG tablet Take 1 tablet (10 mg total) by mouth every 6 (six) hours as needed (Nausea or vomiting). 02/16/14  Yes Deatra Robinson, MD  promethazine (PHENERGAN) 25 MG tablet Take 1 tablet (25 mg total) by mouth every 6 (six) hours as needed for nausea or vomiting. 03/25/14  Yes Minette Headland, NP  ibuprofen (ADVIL,MOTRIN) 600 MG tablet Take 1 tablet (600 mg total) by mouth every 6 (six) hours as needed. 05/11/14   Merryl Hacker, MD  PRESCRIPTION MEDICATION She receives her chemo treatments at the [facility]. Her oncologist is Dr. Humphrey Rolls. She received her last chemo treatments of Taxotere 162m Injection, Carboplatin 8459m Herceptin 52552mnd Perjeta 420m89m 04/08/14. She also received Neulasta 6mg 73mection on 04/09/14.    Historical Provider, MD   BP 109/93  Pulse 78  Temp(Src) 98.6 F (37 C) (Oral)  Resp 29  SpO2 100%  LMP 02/19/2014 Physical Exam  Nursing note and vitals reviewed. Constitutional: She is oriented to person, place, and time. No distress.  HENT:  Head: Normocephalic and atraumatic.  Alopecia  Eyes: Pupils are equal, round, and reactive to light.  Neck: Neck supple.  Cardiovascular: Normal rate, regular rhythm and normal heart sounds.   No murmur heard. Pulmonary/Chest: Effort normal and breath sounds normal. No respiratory distress. She has no wheezes. She exhibits tenderness.  Abdominal: Soft. Bowel sounds are normal. There is no tenderness. There is no rebound.  Musculoskeletal: She exhibits no edema.  Neurological: She is alert and oriented to person, place, and time.  Skin: Skin is warm and dry.  Psychiatric: She has a normal mood and affect.    ED Course   Procedures (including critical care time) Labs Review Labs Reviewed  CBC - Abnormal; Notable for the following:    RBC 3.62 (*)    Hemoglobin 10.6 (*)    HCT 33.3 (*)    RDW 23.1 (*)    All other components within normal limits  BASIC METABOLIC PANEL - Abnormal; Notable for the following:    Potassium 3.5 (*)    BUN 27 (*)    GFR calc non Af Amer 87 (*)    All other components within normal limits  PRO B NATRIURETIC PEPTIDE - Abnormal; Notable for the following:    Pro B Natriuretic peptide (BNP) 173.9 (*)    All other components within normal limits  TROPONIN I  I-STAT TROPOININ, ED    Imaging Review Dg Chest 2 View  05/11/2014   CLINICAL DATA:  Mid chest pain for 4 days, history of breast malignancy.  EXAM: CHEST  2 VIEW  COMPARISON:  PA and lateral chest x-ray of Apr 13, 2014.  FINDINGS: The lungs are adequately inflated. There is no focal infiltrate. The heart is top-normal in size but stable. The pulmonary vascularity is not engorged. The Port-A-Cath appliance is unchanged. There is no pleural effusion. The bony thorax is unremarkable.  IMPRESSION: There is no acute cardiopulmonary disease.   Electronically Signed   By: David  Martinique   On: 05/11/2014 13:57   Ct Angio Chest W/cm &/or Wo Cm  05/11/2014   CLINICAL DATA:  Shortness of breath and chest pain  EXAM: CT ANGIOGRAPHY CHEST WITH CONTRAST  TECHNIQUE: Multidetector CT imaging of the chest was performed using the standard protocol during bolus administration of intravenous contrast. Multiplanar CT image reconstructions and MIPs were obtained to evaluate the vascular anatomy.  CONTRAST:  139m OMNIPAQUE IOHEXOL 350 MG/ML SOLN  COMPARISON:  Chest radiograph May 11, 2012 and chest CT Apr 13, 2014  FINDINGS: There is no demonstrable pulmonary embolus. There is no thoracic aortic aneurysm or dissection.  There has been slight partial clearing of infiltrate from the posterior right base compared to most recent prior CT examination. Some  residual opacity remains in the posterior segment right lower lobe. The previously noted nodular opacity posterior left base currently measures 6 x 3 mm, smaller than on the previous study. It is seen on axial slice 39, series 7. There is no new nodular opacity or airspace consolidation apparent. There is a minimal bullous disease in the right lower lobe.  Central catheter tip is in the right atrium. There is no appreciable pneumothorax.  There is no appreciable thoracic adenopathy. The pericardium is slightly thickened inferiorly, a stable finding. Heart is slightly enlarged.  Visualized upper abdominal structures appear unremarkable. There are no blastic or lytic bone lesions.  Review of the MIP images confirms the above findings.  IMPRESSION: No demonstrable pulmonary embolus.  The area of previously noted infiltrate in the posterior right base has shown mild partial but incomplete clearing. There is still residual consolidation in a small portion of the posterior segment of the right lower lobe.  The nodular opacity in the posterior left base has become slightly smaller.  No new areas of infiltrate or nodularity identified.  No adenopathy.  Heart is prominent but stable. No appreciable pericardial effusion. Slight inferior pericardial thickening is stable.   Electronically Signed   By: WLowella GripM.D.   On: 05/11/2014 15:28     EKG Interpretation Ventricular bigeminy noted, HR 101      MDM   Final diagnoses:  Chest pain    Patient presents with CP.  Onset on Saturday.  Somewhat atypical for ACS.  No infectious symptoms.  INcreased risk for PE given cancer history.  Does have some reproducible pain on exam. EKG with bigeminy.  Troponin, CXR and BNP reassuring.  CTA chest neg for PE.  WIll obtain delta trop.  If reassuring, will treat with NSAIDs for chest wall pain.    CMerryl Hacker MD 05/13/14 [phone]

## 2014-05-11 NOTE — ED Notes (Signed)
Initial Contact - pt resting on stretcher with family at bedside.  Pt reports feeling "a cold stuck in my chest", reports 7/10 midsternal CP x3 days.  Denies radiation, not reproducible.  Pt reports intermittent palpitations, PVCs and bigeminy noted on monitor at times.  Pt reports "that's normal with the chemo".  Pt reports +SOB, "but that's normal with my anemia".  Speaking full/clear sentences, rr even/un-lab, lsctab.  Pt reports currently receiving chemo, last tx last week, for Breast CA.  Skin PWD.  MAEI.  NAD.

## 2014-05-11 NOTE — ED Notes (Signed)
Pt has breast CA, pt has been having SOB and mid chest pain since Friday. Oncologist wanted pt to have CT taken of chest. Pt currently undergoing chemo. denies n/v/d

## 2014-05-11 NOTE — ED Notes (Signed)
Pt to CT

## 2014-05-11 NOTE — Discharge Instructions (Signed)
Chest Pain (Nonspecific) It is often hard to give a specific diagnosis for the cause of chest pain. There is always a chance that your pain could be related to something serious, such as a heart attack or a blood clot in the lungs. You need to follow up with your caregiver for further evaluation.  Your EKG and heart tests are reassuring. There is no evidence of blood clot on your CT scan. Given that you were pain is reproducible with touch, suspect musculoskeletal etiology.  He will be given anti-inflammatories. If he has new or worsening pain you should be immediately reevaluated CAUSES   Heartburn.  Pneumonia or bronchitis.  Anxiety or stress.  Inflammation around your heart (pericarditis) or lung (pleuritis or pleurisy).  A blood clot in the lung.  A collapsed lung (pneumothorax). It can develop suddenly on its own (spontaneous pneumothorax) or from injury (trauma) to the chest.  Shingles infection (herpes zoster virus). The chest wall is composed of bones, muscles, and cartilage. Any of these can be the source of the pain.  The bones can be bruised by injury.  The muscles or cartilage can be strained by coughing or overwork.  The cartilage can be affected by inflammation and become sore (costochondritis). DIAGNOSIS  Lab tests or other studies, such as X-rays, electrocardiography, stress testing, or cardiac imaging, may be needed to find the cause of your pain.  TREATMENT   Treatment depends on what may be causing your chest pain. Treatment may include:  Acid blockers for heartburn.  Anti-inflammatory medicine.  Pain medicine for inflammatory conditions.  Antibiotics if an infection is present.  You may be advised to change lifestyle habits. This includes stopping smoking and avoiding alcohol, caffeine, and chocolate.  You may be advised to keep your head raised (elevated) when sleeping. This reduces the chance of acid going backward from your stomach into your  esophagus.  Most of the time, nonspecific chest pain will improve within 2 to 3 days with rest and mild pain medicine. HOME CARE INSTRUCTIONS   If antibiotics were prescribed, take your antibiotics as directed. Finish them even if you start to feel better.  For the next few days, avoid physical activities that bring on chest pain. Continue physical activities as directed.  Do not smoke.  Avoid drinking alcohol.  Only take over-the-counter or prescription medicine for pain, discomfort, or fever as directed by your caregiver.  Follow your caregiver's suggestions for further testing if your chest pain does not go away.  Keep any follow-up appointments you made. If you do not go to an appointment, you could develop lasting (chronic) problems with pain. If there is any problem keeping an appointment, you must call to reschedule. SEEK MEDICAL CARE IF:   You think you are having problems from the medicine you are taking. Read your medicine instructions carefully.  Your chest pain does not go away, even after treatment.  You develop a rash with blisters on your chest. SEEK IMMEDIATE MEDICAL CARE IF:   You have increased chest pain or pain that spreads to your arm, neck, jaw, back, or abdomen.  You develop shortness of breath, an increasing cough, or you are coughing up blood.  You have severe back or abdominal pain, feel nauseous, or vomit.  You develop severe weakness, fainting, or chills.  You have a fever. THIS IS AN EMERGENCY. Do not wait to see if the pain will go away. Get medical help at once. Call your local emergency services (911 in [place]  Do not drive yourself to the hospital. MAKE SURE YOU:   Understand these instructions.  Will watch your condition.  Will get help right away if you are not doing well or get worse. Document Released: 08/29/2005 Document Revised: 02/11/2012 Document Reviewed: 06/24/2008 [facility] Patient Information 2014 [place].

## 2014-05-12 NOTE — ED Provider Notes (Signed)
  Physical Exam  BP 109/93  Pulse 78  Temp(Src) 98.6 F (37 C) (Oral)  Resp 29  SpO2 100%  LMP 02/19/2014  Physical Exam  ED Course  Procedures  MDM 2nd troponin negative. Discharge home      Jasper Riling. Alvino Chapel, MD 05/12/14 [phone]

## 2014-05-12 NOTE — Telephone Encounter (Signed)
Left message for pt to f/u with the oncology to receive the documentation's she is requesting.

## 2014-05-14 ENCOUNTER — Encounter: Payer: Self-pay | Admitting: Oncology

## 2014-05-14 ENCOUNTER — Other Ambulatory Visit (HOSPITAL_BASED_OUTPATIENT_CLINIC_OR_DEPARTMENT_OTHER): Payer: Medicaid Other

## 2014-05-14 ENCOUNTER — Ambulatory Visit (HOSPITAL_BASED_OUTPATIENT_CLINIC_OR_DEPARTMENT_OTHER): Payer: Medicaid Other | Admitting: Oncology

## 2014-05-14 VITALS — BP 127/79 | HR 111 | Temp 100.3°F | Resp 18 | Ht 65.0 in | Wt 198.1 lb

## 2014-05-14 DIAGNOSIS — C50412 Malignant neoplasm of upper-outer quadrant of left female breast: Secondary | ICD-10-CM

## 2014-05-14 DIAGNOSIS — C773 Secondary and unspecified malignant neoplasm of axilla and upper limb lymph nodes: Secondary | ICD-10-CM

## 2014-05-14 DIAGNOSIS — Z1501 Genetic susceptibility to malignant neoplasm of breast: Secondary | ICD-10-CM

## 2014-05-14 DIAGNOSIS — C50419 Malignant neoplasm of upper-outer quadrant of unspecified female breast: Secondary | ICD-10-CM

## 2014-05-14 DIAGNOSIS — Z171 Estrogen receptor negative status [ER-]: Secondary | ICD-10-CM

## 2014-05-14 DIAGNOSIS — R7989 Other specified abnormal findings of blood chemistry: Secondary | ICD-10-CM

## 2014-05-14 DIAGNOSIS — R079 Chest pain, unspecified: Secondary | ICD-10-CM

## 2014-05-14 LAB — CBC WITH DIFFERENTIAL/PLATELET
BASO%: 0.3 % (ref 0.0–2.0)
Basophils Absolute: 0 10*3/uL (ref 0.0–0.1)
EOS ABS: 0 10*3/uL (ref 0.0–0.5)
EOS%: 0.1 % (ref 0.0–7.0)
HEMATOCRIT: 30.4 % — AB (ref 34.8–46.6)
HGB: 10.1 g/dL — ABNORMAL LOW (ref 11.6–15.9)
LYMPH%: 14 % (ref 14.0–49.7)
MCH: 30.5 pg (ref 25.1–34.0)
MCHC: 33.3 g/dL (ref 31.5–36.0)
MCV: 91.8 fL (ref 79.5–101.0)
MONO#: 1.4 10*3/uL — AB (ref 0.1–0.9)
MONO%: 10.7 % (ref 0.0–14.0)
NEUT#: 10 10*3/uL — ABNORMAL HIGH (ref 1.5–6.5)
NEUT%: 74.9 % (ref 38.4–76.8)
Platelets: 369 10*3/uL (ref 145–400)
RBC: 3.31 10*6/uL — AB (ref 3.70–5.45)
RDW: 26.6 % — ABNORMAL HIGH (ref 11.2–14.5)
WBC: 13.3 10*3/uL — AB (ref 3.9–10.3)
lymph#: 1.9 10*3/uL (ref 0.9–3.3)

## 2014-05-14 LAB — COMPREHENSIVE METABOLIC PANEL (CC13)
ALBUMIN: 3.2 g/dL — AB (ref 3.5–5.0)
ALT: 145 U/L — ABNORMAL HIGH (ref 0–55)
ANION GAP: 6 meq/L (ref 3–11)
AST: 59 U/L — ABNORMAL HIGH (ref 5–34)
Alkaline Phosphatase: 91 U/L (ref 40–150)
BILIRUBIN TOTAL: 0.22 mg/dL (ref 0.20–1.20)
BUN: 10.5 mg/dL (ref 7.0–26.0)
CO2: 29 meq/L (ref 22–29)
Calcium: 9.2 mg/dL (ref 8.4–10.4)
Chloride: 102 mEq/L (ref 98–109)
Creatinine: 0.9 mg/dL (ref 0.6–1.1)
Glucose: 99 mg/dl (ref 70–140)
Potassium: 3.9 mEq/L (ref 3.5–5.1)
SODIUM: 137 meq/L (ref 136–145)
TOTAL PROTEIN: 6.3 g/dL — AB (ref 6.4–8.3)

## 2014-05-14 MED ORDER — OXYCODONE HCL 5 MG PO CAPS: ORAL_CAPSULE | ORAL | Status: DC

## 2014-05-14 MED ORDER — PANTOPRAZOLE SODIUM 40 MG PO TBEC: 40.0000 mg | DELAYED_RELEASE_TABLET | Freq: Every day | ORAL | Status: DC

## 2014-05-14 NOTE — Progress Notes (Signed)
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 She is receiving neoadjuvant carboplatin/ taxotere with herceptin and pertuzumab, due cycle 5 of 6 on 05-27-14. She is BRCA 1 positive.  INTERVAL HISTORY Cycle 4 chemotherapy on 05-06-14 was tolerated better initially than prior cycles; she had neulasta on 05-07-14. That cycle was delayed x 1 week for cardiology evaluation. She was evaluated in ED on 05-11-14 for sharp sternal chest pain which began 05-08-14. In ED she was afebrile with O2 sat 100%, troponins negative x2, EKG with bigeminy but no concern for pericarditis by interpretation; CT angio chest negative for PE, improvement in left base nodular opacity,no pericardial effusion and no bone lesions identified.She has not filled prescription for ibuprofen 600 mg q 6 hr from ED. The chest pain has continued since she was seen in ED, described as continuous and sharp, less severe when she is lying down, not associated with any difficulty or pain on swallowing, no change in slight NP cough that she has had for perhaps a few weeks. She may have some GERD.  She has no other pain, no fever. Taste is unpleasant from chemo and po intake is limited, tho she ate part of [place] and cheese sandwich and is drinking gingerale and water. PAC access was difficult in ED, with bruising.  Course has also been complicated by superficial infection at [facility], resolved by 04-27-14.     ONCOLOGIC HISTORY #1 patient palpated a left breast mass in the upper outer quadrant. Mammogram at [facility] 01-18-14 showed a 3.2 cm mass in the 2:00 position 7 cm from the nipple. There was also noted to be an 8 mm mass 8 cm from the nipple in the ipsilateral breast. She was also noted to have positive lymph  nodes. She had a biopsy of both masses performed as well as the lymph node. 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 MRI confirmed 2.8 and 1.3 cm mass is as well as lymph node. On the right he was noted to have a 6 mm nodule. This has been biopsied and is negative.  #2 staging scans: PET scan  1. Hypermetabolic left breast mass consistent with known neoplasm.  There are also FDG positive left axillary lymph nodes.  2. Benign-appearing brown fat activity and muscular activity in the  neck and chest but no findings for metastatic disease involving the  neck, chest, abdomen or pelvis.  3. No findings for metastatic bone disease.  4. Moderate FDG activity in the endometrial canal is likely due to  secretory phase of ovulation or menses. No mass is seen.  5. Uterine fibroids  CT SCAN:  1. 3 cm left breast mass corresponding to the patient's known  neoplasm. Enlarged left axillary lymph nodes are positive on the PET  scan.  2. No CT findings for metastatic disease involving the chest,  abdomen or pelvis and no evidence of osseous metastatic disease.  3. Mildly enlarged fibroid uterus.  #3 genetic testing: Patient is a BRCA1 mutation carrier  #4 echocardiogram:  02/19/2014: Left ventricular ejection fraction 45-50%. Patient was seen by cardiology and they have cleared her for chemotherapy with anti-HER-2-based regimen  #5 neoadjuvant  chemotherapy: Patient recommended Taxotere carboplatinum Herceptin and perjeta every 3 weeks for a total of 6 cycles.Cycle 1 began 02-25-14.    Review of systems as above, also: Denies orthostatic symptoms. PAC not painful. No abdominal pain. No LE swelling. No increased lacrimation. No significant nail changes. Remainder of 10 point Review of Systems negative.  Objective:  Vital signs in last 24  hours:  BP 127/79  Pulse 111  Temp(Src) 100.3 F (37.9 C) (Oral)  Resp 18  Ht 5' 5"  (1.651 m)  Wt 198 lb 1.6 oz (89.858 kg)  BMI 32.97 kg/m2  LMP 02/19/2014 Husband very supportive. Alert, oriented and appropriate. Ambulatory without assistance. Looks moderately uncomfortable lying on exam table, respirations not labored RA, no cough during visit.  Alopecia   HEENT:PERRL, sclerae not icteric. Oral mucosa moist without lesions, posterior pharynx clear.  Neck supple. No JVD.  Lymphatics:no cervical,suraclavicular, axillary adenopathy Resp: clear to auscultation bilaterally and normal percussion bilaterally Cardio: regular rate and rhythm, does not seem to be bigeminy now. No gallop. Clear heart sounds. Slightly tender along sternum. GI: soft, nontender including epigastrium, not obviously distended, no mass or organomegaly. Few bowel sounds.  Musculoskeletal/ Extremities: LE without pitting edema, cords, tenderness. Back not tender. Neuro: no peripheral neuropathy. Otherwise nonfocal Skin otherwise without rash, ecchymosis, petechiae Patient did not undress for breast exam. Portacath- surrounding ecchymosis ~ 4 cm diameter, no frank erythema or heat, not tender  Lab Results:  Results for orders placed in visit on 05/14/14  CBC WITH DIFFERENTIAL      Result Value Ref Range   WBC 13.3 (*) 3.9 - 10.3 10e3/uL   NEUT# 10.0 (*) 1.5 - 6.5 10e3/uL   HGB 10.1 (*) 11.6 - 15.9 g/dL   HCT 30.4 (*) 34.8 - 46.6 %   Platelets 369  145 - 400 10e3/uL   MCV 91.8  79.5 - 101.0 fL   MCH 30.5  25.1 - 34.0 pg   MCHC 33.3  31.5 - 36.0 g/dL   RBC 3.31 (*) 3.70 - 5.45 10e6/uL   RDW 26.6 (*) 11.2 - 14.5 %   lymph# 1.9  0.9 - 3.3 10e3/uL   MONO# 1.4 (*) 0.1 - 0.9 10e3/uL   Eosinophils Absolute 0.0  0.0 - 0.5 10e3/uL   Basophils Absolute 0.0  0.0 - 0.1 10e3/uL   NEUT% 74.9  38.4 - 76.8 %   LYMPH% 14.0  14.0 - 49.7 %   MONO% 10.7  0.0 - 14.0 %   EOS% 0.1  0.0 - 7.0 %   BASO% 0.3  0.0 - 2.0 %   COMPREHENSIVE METABOLIC PANEL (ID78)      Result Value Ref Range   Sodium 137  136 - 145 mEq/L   Potassium 3.9  3.5 - 5.1 mEq/L   Chloride 102  98 - 109 mEq/L   CO2 29  22 - 29 mEq/L   Glucose 99  70 - 140 mg/dl   BUN 10.5  7.0 - 26.0 mg/dL   Creatinine 0.9  0.6 - 1.1 mg/dL   Total Bilirubin 0.22  0.20 - 1.20 mg/dL   Alkaline Phosphatase 91  40 - 150 U/L   AST 59 (*) 5 - 34 U/L   ALT 145 (*) 0 - 55 U/L   Total Protein 6.3 (*) 6.4 - 8.3 g/dL   Albumin 3.2 (*) 3.5 - 5.0 g/dL   Calcium 9.2  8.4 - 10.4 mg/dL   Anion Gap 6  3 - 11 mEq/L   CMET resulted  after visit. Elevated LFTs are new, etiology not clear.  Studies/Results:  CT ANGIOGRAPHY CHEST WITH CONTRAST 05-11-14  COMPARISON: Chest radiograph May 11, 2012 and chest CT Apr 13, 2014  FINDINGS:  There is no demonstrable pulmonary embolus. There is no thoracic  aortic aneurysm or dissection.  There has been slight partial clearing of infiltrate from the  posterior right base compared to most recent prior CT examination.  Some residual opacity remains in the posterior segment right lower  lobe. The previously noted nodular opacity posterior left base  currently measures 6 x 3 mm, smaller than on the previous study. It  is seen on axial slice 39, series 7. There is no new nodular opacity  or airspace consolidation apparent. There is a minimal bullous  disease in the right lower lobe.  Central catheter tip is in the right atrium. There is no appreciable  pneumothorax.  There is no appreciable thoracic adenopathy. The pericardium is  slightly thickened inferiorly, a stable finding. Heart is slightly  enlarged.  Visualized upper abdominal structures appear unremarkable. There are  no blastic or lytic bone lesions.  Review of the MIP images confirms the above findings.  IMPRESSION:  No demonstrable pulmonary embolus.  The area of previously noted infiltrate in the posterior right base  has shown mild partial but incomplete  clearing. There is still  residual consolidation in a small portion of the posterior segment  of the right lower lobe.  The nodular opacity in the posterior left base has become slightly  smaller.  No new areas of infiltrate or nodularity identified. No adenopathy.  Heart is prominent but stable. No appreciable pericardial effusion.  Slight inferior pericardial thickening is stable.  Medications: I have reviewed the patient's current medications.  DISCUSSION: I have offered IVF today, which she declines. Etiology of the chest pain is not clear: doubt GI but will start protonix due to possible gastritis from decadron etc.Doubt pericarditis with no associated EKG findings. No evidence of sternal lesion or central chest pathology on CT angio chest 05-11-14. Possibly costochondritis. Possibly related to neulasta, tho prolonged and different than prior neulasta effects if so. In addition to protonix, she will try the ibuprofen from ED (take this with food), and prescription given for prn oxyIR 5 mg q 4-6 hr prn # 12.   Etiology of elevated LFTs available after visit also not clear.  Assessment/Plan: 1.T2N1cM0 invasive ductal carcinoma of left breast, ER PR negative, HER2 positive, with neoadjuvant taxotere carboplatin + herceptin perjeta in process. She is due cycle 5 of 6 on 05-27-14. She has appointment for lab and provider day of treatment. 2.central chest pain: troponins and CT angio chest negative in ED on 05-11-14. See discussion and plan above. RN to follow up by phone early next week. 3.new elevation in LFTs, unclear etiology. Will need to recheck before next chemo. 4.BRCA 1 positive 5.PAC in: extensive bruising with access in ED. Previous superficial infection resolved.    Patient and husband know to call if symptoms worse or do not improve prior to next scheduled visit. Time spent 30 min including >50% counseling and coordination of care   Benz Vandenberghe P, MD   05/14/2014, 9:07  PM

## 2014-05-18 ENCOUNTER — Telehealth: Payer: Self-pay | Admitting: *Deleted

## 2014-05-18 NOTE — Telephone Encounter (Signed)
Message copied by Verlon Setting on Tue May 18, 2014  1:17 PM ------      Message from: Gordy Levan      Created: Sat May 15, 2014 11:23 PM       Please could someone check on this lady by phone on Mon or Tues 6-15 or 6-16 re chest pain; also new elevated LFTs when I saw her on 6-12.        She was to try protonix, ibuprofen and oxyIR -- details in my office note.      If still symptoms, someone needs to see her this week, and repeat CMET if so.            Thanks      Lennis       ------

## 2014-05-18 NOTE — Telephone Encounter (Signed)
Attempted to call and speak with patient to follow up visit on 05/14/2014. Unable to reach patient, left detailed message to call us back if she was still having the same symptoms and if she was, we would like to see her back in the office for further assessment. Will wait to hear from patient.

## 2014-05-19 ENCOUNTER — Telehealth: Payer: Self-pay

## 2014-05-19 ENCOUNTER — Emergency Department (HOSPITAL_COMMUNITY): Payer: Medicaid Other

## 2014-05-19 ENCOUNTER — Encounter (HOSPITAL_COMMUNITY): Payer: Self-pay | Admitting: Emergency Medicine

## 2014-05-19 ENCOUNTER — Inpatient Hospital Stay (HOSPITAL_COMMUNITY)
Admission: EM | Admit: 2014-05-19 | Discharge: 2014-05-30 | DRG: 871 | Disposition: A | Discharge status: Home Health | Payer: Medicaid Other | Attending: Internal Medicine | Admitting: Internal Medicine

## 2014-05-19 DIAGNOSIS — Z6833 Body mass index (BMI) 33.0-33.9, adult: Secondary | ICD-10-CM

## 2014-05-19 DIAGNOSIS — Z171 Estrogen receptor negative status [ER-]: Secondary | ICD-10-CM

## 2014-05-19 DIAGNOSIS — D649 Anemia, unspecified: Secondary | ICD-10-CM

## 2014-05-19 DIAGNOSIS — T80218A Other infection due to central venous catheter, initial encounter: Secondary | ICD-10-CM | POA: Diagnosis present

## 2014-05-19 DIAGNOSIS — D6481 Anemia due to antineoplastic chemotherapy: Secondary | ICD-10-CM | POA: Diagnosis present

## 2014-05-19 DIAGNOSIS — I82401 Acute embolism and thrombosis of unspecified deep veins of right lower extremity: Secondary | ICD-10-CM

## 2014-05-19 DIAGNOSIS — M25511 Pain in right shoulder: Secondary | ICD-10-CM

## 2014-05-19 DIAGNOSIS — M545 Low back pain, unspecified: Secondary | ICD-10-CM

## 2014-05-19 DIAGNOSIS — I82C19 Acute embolism and thrombosis of unspecified internal jugular vein: Secondary | ICD-10-CM | POA: Diagnosis present

## 2014-05-19 DIAGNOSIS — Y849 Medical procedure, unspecified as the cause of abnormal reaction of the patient, or of later complication, without mention of misadventure at the time of the procedure: Secondary | ICD-10-CM | POA: Diagnosis present

## 2014-05-19 DIAGNOSIS — Z1501 Genetic susceptibility to malignant neoplasm of breast: Secondary | ICD-10-CM

## 2014-05-19 DIAGNOSIS — Z87891 Personal history of nicotine dependence: Secondary | ICD-10-CM

## 2014-05-19 DIAGNOSIS — M542 Cervicalgia: Secondary | ICD-10-CM

## 2014-05-19 DIAGNOSIS — I82629 Acute embolism and thrombosis of deep veins of unspecified upper extremity: Secondary | ICD-10-CM | POA: Diagnosis present

## 2014-05-19 DIAGNOSIS — I82409 Acute embolism and thrombosis of unspecified deep veins of unspecified lower extremity: Secondary | ICD-10-CM

## 2014-05-19 DIAGNOSIS — D62 Acute posthemorrhagic anemia: Secondary | ICD-10-CM

## 2014-05-19 DIAGNOSIS — T80212A Local infection due to central venous catheter, initial encounter: Secondary | ICD-10-CM

## 2014-05-19 DIAGNOSIS — A4101 Sepsis due to Methicillin susceptible Staphylococcus aureus: Secondary | ICD-10-CM

## 2014-05-19 DIAGNOSIS — Z800 Family history of malignant neoplasm of digestive organs: Secondary | ICD-10-CM

## 2014-05-19 DIAGNOSIS — I82B19 Acute embolism and thrombosis of unspecified subclavian vein: Secondary | ICD-10-CM | POA: Diagnosis present

## 2014-05-19 DIAGNOSIS — D259 Leiomyoma of uterus, unspecified: Secondary | ICD-10-CM | POA: Diagnosis present

## 2014-05-19 DIAGNOSIS — N179 Acute kidney failure, unspecified: Secondary | ICD-10-CM

## 2014-05-19 DIAGNOSIS — J189 Pneumonia, unspecified organism: Secondary | ICD-10-CM

## 2014-05-19 DIAGNOSIS — C50919 Malignant neoplasm of unspecified site of unspecified female breast: Secondary | ICD-10-CM | POA: Diagnosis present

## 2014-05-19 DIAGNOSIS — T80211S Bloodstream infection due to central venous catheter, sequela: Secondary | ICD-10-CM

## 2014-05-19 DIAGNOSIS — C50419 Malignant neoplasm of upper-outer quadrant of unspecified female breast: Secondary | ICD-10-CM

## 2014-05-19 DIAGNOSIS — T451X5A Adverse effect of antineoplastic and immunosuppressive drugs, initial encounter: Secondary | ICD-10-CM | POA: Diagnosis present

## 2014-05-19 DIAGNOSIS — J9819 Other pulmonary collapse: Secondary | ICD-10-CM | POA: Diagnosis present

## 2014-05-19 DIAGNOSIS — A419 Sepsis, unspecified organism: Secondary | ICD-10-CM

## 2014-05-19 DIAGNOSIS — Z803 Family history of malignant neoplasm of breast: Secondary | ICD-10-CM

## 2014-05-19 DIAGNOSIS — Z1509 Genetic susceptibility to other malignant neoplasm: Secondary | ICD-10-CM

## 2014-05-19 DIAGNOSIS — D638 Anemia in other chronic diseases classified elsewhere: Secondary | ICD-10-CM | POA: Diagnosis present

## 2014-05-19 DIAGNOSIS — A412 Sepsis due to unspecified staphylococcus: Secondary | ICD-10-CM

## 2014-05-19 DIAGNOSIS — T80212D Local infection due to central venous catheter, subsequent encounter: Secondary | ICD-10-CM

## 2014-05-19 DIAGNOSIS — C50412 Malignant neoplasm of upper-outer quadrant of left female breast: Secondary | ICD-10-CM

## 2014-05-19 DIAGNOSIS — R000 Tachycardia, unspecified: Secondary | ICD-10-CM

## 2014-05-19 DIAGNOSIS — I10 Essential (primary) hypertension: Secondary | ICD-10-CM

## 2014-05-19 DIAGNOSIS — T827XXA Infection and inflammatory reaction due to other cardiac and vascular devices, implants and grafts, initial encounter: Secondary | ICD-10-CM

## 2014-05-19 DIAGNOSIS — I498 Other specified cardiac arrhythmias: Secondary | ICD-10-CM | POA: Diagnosis present

## 2014-05-19 DIAGNOSIS — K219 Gastro-esophageal reflux disease without esophagitis: Secondary | ICD-10-CM | POA: Diagnosis present

## 2014-05-19 DIAGNOSIS — T80211A Bloodstream infection due to central venous catheter, initial encounter: Secondary | ICD-10-CM | POA: Diagnosis present

## 2014-05-19 DIAGNOSIS — D509 Iron deficiency anemia, unspecified: Secondary | ICD-10-CM

## 2014-05-19 DIAGNOSIS — A409 Streptococcal sepsis, unspecified: Secondary | ICD-10-CM

## 2014-05-19 LAB — CBC WITH DIFFERENTIAL/PLATELET
Basophils Absolute: 0 10*3/uL (ref 0.0–0.1)
Basophils Relative: 0 % (ref 0–1)
EOS PCT: 0 % (ref 0–5)
Eosinophils Absolute: 0 10*3/uL (ref 0.0–0.7)
HEMATOCRIT: 26.4 % — AB (ref 36.0–46.0)
Hemoglobin: 8.8 g/dL — ABNORMAL LOW (ref 12.0–15.0)
LYMPHS ABS: 1.4 10*3/uL (ref 0.7–4.0)
LYMPHS PCT: 5 % — AB (ref 12–46)
MCH: 30.8 pg (ref 26.0–34.0)
MCHC: 33.3 g/dL (ref 30.0–36.0)
MCV: 92.3 fL (ref 78.0–100.0)
Monocytes Absolute: 2 10*3/uL — ABNORMAL HIGH (ref 0.1–1.0)
Monocytes Relative: 7 % (ref 3–12)
Neutro Abs: 24.9 10*3/uL — ABNORMAL HIGH (ref 1.7–7.7)
Neutrophils Relative %: 88 % — ABNORMAL HIGH (ref 43–77)
Platelets: 257 10*3/uL (ref 150–400)
RBC: 2.86 MIL/uL — ABNORMAL LOW (ref 3.87–5.11)
RDW: 21 % — ABNORMAL HIGH (ref 11.5–15.5)
WBC: 28.3 10*3/uL — AB (ref 4.0–10.5)

## 2014-05-19 LAB — HEPATIC FUNCTION PANEL
ALT: 84 U/L — ABNORMAL HIGH (ref 0–35)
AST: 30 U/L (ref 0–37)
Albumin: 3.2 g/dL — ABNORMAL LOW (ref 3.5–5.2)
Alkaline Phosphatase: 166 U/L — ABNORMAL HIGH (ref 39–117)
Bilirubin, Direct: 0.3 mg/dL (ref 0.0–0.3)
Indirect Bilirubin: 0.5 mg/dL (ref 0.3–0.9)
TOTAL PROTEIN: 7.7 g/dL (ref 6.0–8.3)
Total Bilirubin: 0.8 mg/dL (ref 0.3–1.2)

## 2014-05-19 LAB — URINALYSIS, ROUTINE W REFLEX MICROSCOPIC
Glucose, UA: NEGATIVE mg/dL
KETONES UR: 15 mg/dL — AB
NITRITE: NEGATIVE
PH: 5.5 (ref 5.0–8.0)
PROTEIN: 100 mg/dL — AB
Specific Gravity, Urine: 1.022 (ref 1.005–1.030)
UROBILINOGEN UA: 1 mg/dL (ref 0.0–1.0)

## 2014-05-19 LAB — URINE MICROSCOPIC-ADD ON

## 2014-05-19 LAB — BASIC METABOLIC PANEL
BUN: 14 mg/dL (ref 6–23)
CHLORIDE: 95 meq/L — AB (ref 96–112)
CO2: 24 mEq/L (ref 19–32)
Calcium: 9.7 mg/dL (ref 8.4–10.5)
Creatinine, Ser: 1.4 mg/dL — ABNORMAL HIGH (ref 0.50–1.10)
GFR calc Af Amer: 54 mL/min — ABNORMAL LOW (ref >90)
GFR calc non Af Amer: 46 mL/min — ABNORMAL LOW (ref >90)
GLUCOSE: 112 mg/dL — AB (ref 70–99)
POTASSIUM: 4 meq/L (ref 3.7–5.3)
Sodium: 138 mEq/L (ref 137–147)

## 2014-05-19 LAB — LIPASE, BLOOD: LIPASE: 39 U/L (ref 11–59)

## 2014-05-19 LAB — MRSA PCR SCREENING: MRSA BY PCR: NEGATIVE

## 2014-05-19 LAB — PREGNANCY, URINE: Preg Test, Ur: NEGATIVE

## 2014-05-19 LAB — I-STAT CG4 LACTIC ACID, ED: Lactic Acid, Venous: 1.68 mmol/L (ref 0.5–2.2)

## 2014-05-19 MED ORDER — ACETAMINOPHEN 325 MG PO TABS
650.0000 mg | ORAL_TABLET | Freq: Four times a day (QID) | ORAL | Status: DC | PRN
  Administered 2014-05-20 – 2014-05-28 (×8): 650 mg via ORAL
  Filled 2014-05-19 (×8): qty 2

## 2014-05-19 MED ORDER — PANTOPRAZOLE SODIUM 40 MG PO TBEC
40.0000 mg | DELAYED_RELEASE_TABLET | Freq: Every day | ORAL | Status: DC
  Administered 2014-05-19 – 2014-05-30 (×12): 40 mg via ORAL
  Filled 2014-05-19 (×12): qty 1

## 2014-05-19 MED ORDER — DEXTROSE 5 % IV SOLN
1.0000 g | Freq: Three times a day (TID) | INTRAVENOUS | Status: DC
  Administered 2014-05-20 – 2014-05-21 (×5): 1 g via INTRAVENOUS
  Filled 2014-05-19 (×6): qty 1

## 2014-05-19 MED ORDER — VANCOMYCIN HCL IN DEXTROSE 1-5 GM/200ML-% IV SOLN
1000.0000 mg | Freq: Once | INTRAVENOUS | Status: AC
  Administered 2014-05-19: 1000 mg via INTRAVENOUS
  Filled 2014-05-19: qty 200

## 2014-05-19 MED ORDER — SODIUM CHLORIDE 0.9 % IV SOLN
INTRAVENOUS | Status: DC
  Administered 2014-05-19: via INTRAVENOUS
  Administered 2014-05-20: 1000 mL via INTRAVENOUS
  Administered 2014-05-21: 22:00:00 via INTRAVENOUS
  Administered 2014-05-22: 999 mL via INTRAVENOUS
  Administered 2014-05-23 (×2): via INTRAVENOUS

## 2014-05-19 MED ORDER — SODIUM CHLORIDE 0.9 % IV SOLN: 1000.0000 mL | Freq: Once | INTRAVENOUS | Status: AC

## 2014-05-19 MED ORDER — LORAZEPAM 0.5 MG PO TABS
0.5000 mg | ORAL_TABLET | Freq: Four times a day (QID) | ORAL | Status: DC | PRN
  Administered 2014-05-20: 0.5 mg via ORAL
  Filled 2014-05-19: qty 1

## 2014-05-19 MED ORDER — HYDROMORPHONE HCL PF 1 MG/ML IJ SOLN
0.5000 mg | INTRAMUSCULAR | Status: DC | PRN
  Administered 2014-05-25 – 2014-05-29 (×7): 1 mg via INTRAVENOUS
  Filled 2014-05-19 (×7): qty 1

## 2014-05-19 MED ORDER — SODIUM CHLORIDE 0.9 % IV SOLN
1000.0000 mL | INTRAVENOUS | Status: DC
  Administered 2014-05-19: 1000 mL via INTRAVENOUS

## 2014-05-19 MED ORDER — CARVEDILOL 3.125 MG PO TABS
3.1250 mg | ORAL_TABLET | Freq: Two times a day (BID) | ORAL | Status: DC
  Administered 2014-05-20 – 2014-05-25 (×12): 3.125 mg via ORAL
  Filled 2014-05-19 (×14): qty 1

## 2014-05-19 MED ORDER — LORATADINE 10 MG PO TABS
10.0000 mg | ORAL_TABLET | Freq: Every day | ORAL | Status: DC | PRN
  Administered 2014-05-25: 10 mg via ORAL
  Filled 2014-05-19: qty 1

## 2014-05-19 MED ORDER — ACETAMINOPHEN 500 MG PO TABS
1000.0000 mg | ORAL_TABLET | Freq: Once | ORAL | Status: AC
  Administered 2014-05-19: 1000 mg via ORAL
  Filled 2014-05-19: qty 2

## 2014-05-19 MED ORDER — FERROUS SULFATE 325 (65 FE) MG PO TBEC: 325.0000 mg | DELAYED_RELEASE_TABLET | Freq: Three times a day (TID) | ORAL | Status: DC

## 2014-05-19 MED ORDER — ENOXAPARIN SODIUM 40 MG/0.4ML ~~LOC~~ SOLN
40.0000 mg | Freq: Every day | SUBCUTANEOUS | Status: DC
  Administered 2014-05-19 – 2014-05-21 (×3): 40 mg via SUBCUTANEOUS
  Filled 2014-05-19 (×5): qty 0.4

## 2014-05-19 MED ORDER — FERROUS SULFATE 325 (65 FE) MG PO TABS
325.0000 mg | ORAL_TABLET | Freq: Three times a day (TID) | ORAL | Status: DC
  Administered 2014-05-20 – 2014-05-28 (×25): 325 mg via ORAL
  Filled 2014-05-19 (×29): qty 1

## 2014-05-19 MED ORDER — ONDANSETRON HCL 4 MG/2ML IJ SOLN
4.0000 mg | Freq: Once | INTRAMUSCULAR | Status: AC
  Administered 2014-05-19: 4 mg via INTRAVENOUS
  Filled 2014-05-19: qty 2

## 2014-05-19 MED ORDER — OXYCODONE HCL 5 MG PO TABS
5.0000 mg | ORAL_TABLET | ORAL | Status: DC | PRN
  Administered 2014-05-21: 5 mg via ORAL
  Filled 2014-05-19 (×2): qty 1

## 2014-05-19 MED ORDER — ALUM & MAG HYDROXIDE-SIMETH 200-200-20 MG/5ML PO SUSP: 30.0000 mL | Freq: Four times a day (QID) | ORAL | Status: DC | PRN

## 2014-05-19 MED ORDER — VANCOMYCIN HCL IN DEXTROSE 1-5 GM/200ML-% IV SOLN
1000.0000 mg | Freq: Three times a day (TID) | INTRAVENOUS | Status: DC
  Administered 2014-05-20 – 2014-05-21 (×5): 1000 mg via INTRAVENOUS
  Filled 2014-05-19 (×7): qty 200

## 2014-05-19 MED ORDER — ONDANSETRON HCL 4 MG PO TABS: 4.0000 mg | ORAL_TABLET | Freq: Four times a day (QID) | ORAL | Status: DC | PRN

## 2014-05-19 MED ORDER — SODIUM CHLORIDE 0.9 % IV BOLUS (SEPSIS)
30.0000 mL/kg | Freq: Once | INTRAVENOUS | Status: AC
  Administered 2014-05-19: 2694 mL via INTRAVENOUS

## 2014-05-19 MED ORDER — ONDANSETRON HCL 4 MG/2ML IJ SOLN
4.0000 mg | Freq: Four times a day (QID) | INTRAMUSCULAR | Status: DC | PRN
  Administered 2014-05-19 – 2014-05-21 (×2): 4 mg via INTRAVENOUS
  Filled 2014-05-19 (×2): qty 2

## 2014-05-19 MED ORDER — SODIUM CHLORIDE 0.9 % IV SOLN: 1000.0000 mL | INTRAVENOUS | Status: DC

## 2014-05-19 MED ORDER — ACETAMINOPHEN 650 MG RE SUPP: 650.0000 mg | Freq: Four times a day (QID) | RECTAL | Status: DC | PRN

## 2014-05-19 MED ORDER — DEXTROSE 5 % IV SOLN
2.0000 g | Freq: Once | INTRAVENOUS | Status: AC
  Administered 2014-05-19: 2 g via INTRAVENOUS
  Filled 2014-05-19: qty 2

## 2014-05-19 MED ORDER — KETOROLAC TROMETHAMINE 15 MG/ML IJ SOLN
15.0000 mg | Freq: Once | INTRAMUSCULAR | Status: AC
  Administered 2014-05-19: 15 mg via INTRAVENOUS
  Filled 2014-05-19: qty 1

## 2014-05-19 NOTE — ED Notes (Addendum)
Pt reports having cough and cold symptoms for several days. N/v yesterday and reports chest pain that is severe through to her back when she coughs. Also feels back pain when she takes a deep breath. ekg done at triage, airway intact. Currently getting chemo for breast cancer. Was seen at [facility] on 6/9 for same and had chest ct scan which was negative.

## 2014-05-19 NOTE — H&P (Signed)
[facility] History and Physical  Tamara [name] PVV:[id number] DOB: July 01, 1971 DOA: 05/19/2014  Referring physician: EDP PCP: Angelica Chessman, MD  Specialists:   Chief Complaint:  Fevers Chills and Cough  HPI: Tamara [name] is a 43 y.o. female with a history of BRCA-1, ER-/PR-/Her2+ Breast Cancer diagnosed 02/2104 currently on chemotherapy and her last chemotherapy treatment was on 05/13/2014 who presents to the ED with complaints of fevers and chills for the past 2 days and coughing and chest discomfort x 1 week.  She has also been having chest discomfort x 1 week and had a CTA of the Chest performed as an outpatient which was negative for a Pulmonary embolism.   She had a temperature to 102 today.     Review of Systems:  Constitutional: No Weight Loss, No Weight Gain, Night Sweats, +Fevers, +Chills, Fatigue, +Generalized Weakness HEENT: No Headaches, Difficulty Swallowing,Tooth/Dental Problems,Sore Throat,  No Sneezing, Rhinitis, Ear Ache, Nasal Congestion, or Post Nasal Drip,  Cardio-vascular:  No Chest pain, Orthopnea, PND, Edema in lower extremities, Anasarca, Dizziness, Palpitations  Resp: No Dyspnea, No DOE, +Productive Cough, No Hemoptysis, No Wheezing.    GI: No Heartburn, Indigestion, Abdominal Pain, Nausea, Vomiting, Diarrhea, Change in Bowel Habits,  Loss of Appetite  GU: No Dysuria, Change in Color of Urine, No Urgency or Frequency.  No flank pain.  Musculoskeletal: No Joint Pain or Swelling.  No Decreased Range of Motion. No Back Pain.  Neurologic: No Syncope, No Seizures, Muscle Weakness, Paresthesia, Vision Disturbance or Loss, No Diplopia, No Vertigo, No Difficulty Walking,  Skin: No Rash or Lesions. Psych: No Change in Mood or Affect. No Depression or Anxiety. No Memory loss. No Confusion or Hallucinations   Past Medical History  Diagnosis Date  . Endometriosis   . Hypertension   . Rash     fine rash on abd  . Anemia   . Wears glasses   . Breast cancer 02/01/14     ER-/PR-/Her2+  . Iron deficiency anemia, unspecified 02/25/2014  . BRCA1 positive     c.190T>G (p.Cys64Gly) @ [facility]       Past Surgical History  Procedure Laterality Date  . Cervical polypectomy  2010  . Cesarean section      one previous  . Tubal ligation    . Portacath placement Right 02/23/2014    Procedure: INSERTION PORT-A-CATH;  Surgeon: Joyice Faster. Cornett, MD;  Location: [place];  Service: General;  Laterality: Right;      Prior to Admission medications   Medication Sig Start Date End Date Taking? Authorizing Provider  carvedilol (COREG) 3.125 MG tablet Take 1 tablet (3.125 mg total) by mouth 2 (two) times daily with a meal. 05/05/14  Yes Jolaine Artist, MD  dexamethasone (DECADRON) 4 MG tablet Take 2 tablets (8 mg total) by mouth 2 (two) times daily. Start the day before Taxotere. Then again the day after chemo for 3 days. 02/16/14  Yes Deatra Robinson, MD  ferrous sulfate 325 (65 FE) MG EC tablet Take 1 tablet (325 mg total) by mouth 3 (three) times daily with meals. 03/04/14  Yes Minette Headland, NP  ibuprofen (ADVIL,MOTRIN) 600 MG tablet Take 1 tablet (600 mg total) by mouth every 6 (six) hours as needed. 05/11/14  Yes Merryl Hacker, MD  lidocaine-prilocaine (EMLA) cream Apply 1 application topically as needed (For port-a-cath.).   Yes Historical Provider, MD  loratadine (CLARITIN) 10 MG tablet Take 10 mg by mouth daily as needed (For inflammation after receiving  her injections during chemo.).    Yes Historical Provider, MD  LORazepam (ATIVAN) 0.5 MG tablet Take 1 tablet (0.5 mg total) by mouth every 6 (six) hours as needed (Nausea or vomiting). 02/16/14  Yes Deatra Robinson, MD  ondansetron (ZOFRAN) 8 MG tablet Take 1 tablet (8 mg total) by mouth 2 (two) times daily. Start the day after chemo for 3 days. Then take as needed for nausea or vomiting. 02/16/14  Yes Deatra Robinson, MD  oxycodone (OXY-IR) 5 MG capsule Take 1 tab every 4-6 hours as needed for  pain 05/14/14  Yes Lennis Marion Downer, MD  oxyCODONE-acetaminophen (PERCOCET/ROXICET) 5-325 MG per tablet Take 1 tablet by mouth every 4 (four) hours as needed for severe pain.    Yes Historical Provider, MD  pantoprazole (PROTONIX) 40 MG tablet Take 1 tablet (40 mg total) by mouth daily. For stomach irritation. 05/14/14  Yes Lennis Marion Downer, MD  prochlorperazine (COMPAZINE) 10 MG tablet Take 1 tablet (10 mg total) by mouth every 6 (six) hours as needed (Nausea or vomiting). 02/16/14  Yes Deatra Robinson, MD  promethazine (PHENERGAN) 25 MG tablet Take 1 tablet (25 mg total) by mouth every 6 (six) hours as needed for nausea or vomiting. 03/25/14  Yes Minette Headland, NP  [place] She receives her chemo treatments at the [facility]. Her oncologist is Dr. Humphrey Rolls. She received her last chemo treatments of Taxotere 127m Injection, Carboplatin 8449m Herceptin 52565mnd Perjeta 420m5m 04/08/14. She also received Neulasta 6mg 106mection on 04/09/14.    Historical Provider, MD     No Known Allergies   Social History:  reports that she quit smoking about 5 years ago. She has quit using smokeless tobacco. She reports that she drinks alcohol. She reports that she does not use illicit drugs.     Family History  Problem Relation Age of Onset  . Breast cancer Mother 56   75urrently 59  .56iabetes Father   . Pancreatic cancer Father 55  .15reast cancer Paternal Aunt 48   51urrently 60; B58A1 positive  . Stroke Maternal Grandfather   . Cancer Paternal Aunt     unk. primary; deceased 70s  56sreast cancer Cousin     daughter of unaffected paternal aunt; dx in her 40s  24s Physical Exam:  GEN:  Pleasant Well Nourished and Well Developed 42 y.59 African American female  examined  and in no acute distress; cooperative with exam Filed Vitals:   05/19/14 2046 05/19/14 2100 05/19/14 2115 05/19/14 2151  BP:  113/58 118/55 114/62  Pulse:  103 102 95  Temp: 102.3 F (39.1  C)   99.5 F (37.5 C)  TempSrc:    Oral  Resp:  _0 Height:    _1  (1.651 m)  Weight:    89.6 kg (197 lb 8.5 oz)  SpO2:  94% 97% 97%   Blood pressure 114/62, pulse 95, temperature 99.5 F (37.5 C), temperature source Oral, resp. rate 31, height _2  (1.651 m), weight 89.6 kg (197 lb 8.5 oz), last menstrual period 02/19/2014, SpO2 97.00%. PSYCH: She is alert and oriented x4; does not appear anxious does not appear depressed; affect is normal HEENT: Normocephalic and Atraumatic, Mucous membranes pink; PERRLA; EOM intact; Fundi:  Benign;  No scleral icterus, Nares: Patent, Oropharynx: Clear, Fair Dentition, Neck:  FROM, no cervical lymphadenopathy nor thyromegaly or carotid bruit; no JVD; Breasts:: Not examined  CHEST WALL: No tenderness CHEST: Normal respiration, clear to auscultation bilaterally HEART: Regular rate and rhythm; no murmurs rubs or gallops BACK: No kyphosis or scoliosis; no CVA tenderness ABDOMEN: Positive Bowel Sounds, soft non-tender; no masses, no organomegaly. Rectal Exam: Not done EXTREMITIES: No cyanosis, clubbing or edema; no ulcerations. Genitalia: not examined PULSES: 2+ and symmetric SKIN: Normal hydration no rash or ulceration CNS:  Alert and Oriented X 4, No Focal Deficits.   Vascular: pulses palpable throughout    Labs on Admission:  Basic Metabolic Panel:  Recent Labs Lab 05/14/14 1449 05/19/14 1858  NA 137 138  K 3.9 4.0  CL  --  95*  CO2 29 24  GLUCOSE 99 112*  BUN 10.5 14  CREATININE 0.9 1.40*  CALCIUM 9.2 9.7   Liver Function Tests:  Recent Labs Lab 05/14/14 1449 05/19/14 1858  AST 59* 30  ALT 145* 84*  ALKPHOS 91 166*  BILITOT 0.22 0.8  PROT 6.3* 7.7  ALBUMIN 3.2* 3.2*    Recent Labs Lab 05/19/14 1858  LIPASE 39   No results found for this basename: AMMONIA,  in the last 168 hours CBC:  Recent Labs Lab 05/14/14 1449 05/19/14 1858  WBC 13.3* 28.3*  NEUTROABS 10.0* 24.9*  HGB 10.1* 8.8*  HCT 30.4* 26.4*   MCV 91.8 92.3  PLT 369 257   Cardiac Enzymes: No results found for this basename: CKTOTAL, CKMB, CKMBINDEX, TROPONINI,  in the last 168 hours  BNP (last 3 results)  Recent Labs  05/11/14 1318  PROBNP 173.9*   CBG: No results found for this basename: GLUCAP,  in the last 168 hours  Radiological Exams on Admission: Dg Chest 2 View  05/19/2014   CLINICAL DATA:  Chest pain.  Cough.  EXAM: CHEST  2 VIEW  COMPARISON:  CT chest 05/11/2014  FINDINGS: Power port catheter noted with tip projected over right atrium. Minimal subsegmental atelectasis/infiltrate right lung base as noted on recent CT of 05/11/2014. Stable cardiomegaly with normal pulmonary vascularity. No pleural effusion or pneumothorax. No acute bony abnormality .  IMPRESSION: Minimal subsegmental atelectasis and/or infiltrate right lung bases as noted on recent CT of 05/11/2014. No other acute or focal abnormalities.   Electronically Signed   By: Marcello Moores  Register   On: 05/19/2014 16:17      Assessment/Plan:   43 y.o. female with  Principal Problem:   Sepsis Active Problems:   HCAP (healthcare-associated pneumonia)   Sinus tachycardia   Breast cancer of upper-outer quadrant of left female breast   HTN (hypertension)   Iron deficiency anemia, unspecified   AKI (acute kidney injury)     1.   Sepsis/HCAP-   IV Vancomycin and Cefepime, and Albuterol nebs, O2 PRN, and IVFs.     2.   Sinus Tachycardia- due to #1, IVFs, and continue Beta Blocker Rx as BP tolerates.    3.   AKI-   IVFs, Monitor BUN/Cr.     4.   HTN-   Monitor BPs,  On Carvedilol, and   5.   Iron deficiency Anemia-  On Iron Rx.     6.   DVT Prophylaxis with Lovenox.        Code Status:    FULL CODE Family Communication:   Husband at Bedside  Disposition Plan:         Inpatient to Stepdown Bed  Time spent:  [facility] Pager [phone]  If 7PM-7AM, please contact night-coverage [email or web address] Password  [facility] 05/19/2014,  9:52 PM

## 2014-05-19 NOTE — Telephone Encounter (Signed)
Called to follow up about blood pressure and whether had gone back to [facility]. Patient stated is starting chemotherapy. Will not work on nutrition at present time due to therapy. [employer] Program will follow up after therapy if does not still have medicaid.

## 2014-05-19 NOTE — ED Provider Notes (Signed)
CSN: [id number]     Arrival date & time 05/19/14  1447 History   First MD Initiated Contact with Patient 05/19/14 1828     Chief Complaint  Patient presents with  . URI  . Cough  . Chest Pain     (Consider location/radiation/quality/duration/timing/severity/associated sxs/prior Treatment) HPI  Tamara [name] is a 43 y.o. female past medical history significant for breast cancer, on her fifth round of chemotherapy (on 6/4) complaining of worsening cough, turning productive 4 days ago and then she started having nausea vomiting diarrhea and fever yesterday. Patient also reports a worsening chest pain she says it is substernal and radiates to the back, this is different than the chest pain we saw her for approximately 10 days ago. Patient denies abdominal pain, dysuria, hematuria, normal vaginal discharge, rash, headache, cervicalgia. States that she gets either Neulasta or Neupogen shots today after her chemotherapy the last one was given on June 5.  Past Medical History  Diagnosis Date  . Endometriosis   . Hypertension   . Rash     fine rash on abd  . Anemia   . Wears glasses   . 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 Iron deficiency anemia, unspecified 02/25/2014  . BRCA1 positive     c.190T>G (p.Cys64Gly) @ [facility]    Past Surgical History  Procedure Laterality Date  . Cervical polypectomy  2010  . Cesarean section      one previous  . Tubal ligation    . Portacath placement Right 02/23/2014    Procedure: INSERTION PORT-A-CATH;  Surgeon: Joyice Faster. Cornett, MD;  Location: [place];  Service: General;  Laterality: Right;   Family History  Problem Relation Age of Onset  . Breast cancer Mother 64    currently 8  . Diabetes Father   . Pancreatic cancer Father 78  . Breast cancer Paternal Aunt 32    currently 42; BRCA1 positive  . Stroke Maternal Grandfather   . Cancer Paternal Aunt     unk. primary; deceased 40s  . Breast cancer Cousin     daughter of  unaffected paternal aunt; dx in her 45s   History  Substance Use Topics  . Smoking status: Former Smoker -- 0.25 packs/day for 15 years    Quit date: 02/18/2009  . Smokeless tobacco: Former Systems developer  . Alcohol Use: Yes     Comment: occasional   OB History   Grav Para Term Preterm Abortions TAB SAB Ect Mult Living   _0 Review of Systems  10 systems reviewed and found to be negative, except as noted in the HPI.   Allergies  Review of patient's allergies indicates no known allergies.  Home Medications   Prior to Admission medications   Medication Sig Start Date End Date Taking? Authorizing Provider  carvedilol (COREG) 3.125 MG tablet Take 1 tablet (3.125 mg total) by mouth 2 (two) times daily with a meal. 05/05/14  Yes Jolaine Artist, MD  dexamethasone (DECADRON) 4 MG tablet Take 2 tablets (8 mg total) by mouth 2 (two) times daily. Start the day before Taxotere. Then again the day after chemo for 3 days. 02/16/14  Yes Deatra Robinson, MD  ferrous sulfate 325 (65 FE) MG EC tablet Take 1 tablet (325 mg total) by mouth 3 (three) times daily with meals. 03/04/14  Yes Minette Headland, NP  ibuprofen (ADVIL,MOTRIN) 600 MG tablet Take 1  tablet (600 mg total) by mouth every 6 (six) hours as needed. 05/11/14  Yes Merryl Hacker, MD  lidocaine-prilocaine (EMLA) cream Apply 1 application topically as needed (For port-a-cath.).   Yes Historical Provider, MD  loratadine (CLARITIN) 10 MG tablet Take 10 mg by mouth daily as needed (For inflammation after receiving her injections during chemo.).    Yes Historical Provider, MD  LORazepam (ATIVAN) 0.5 MG tablet Take 1 tablet (0.5 mg total) by mouth every 6 (six) hours as needed (Nausea or vomiting). 02/16/14  Yes Deatra Robinson, MD  ondansetron (ZOFRAN) 8 MG tablet Take 1 tablet (8 mg total) by mouth 2 (two) times daily. Start the day after chemo for 3 days. Then take as needed for nausea or vomiting. 02/16/14  Yes Deatra Robinson, MD   oxycodone (OXY-IR) 5 MG capsule Take 1 tab every 4-6 hours as needed for pain 05/14/14  Yes Lennis Marion Downer, MD  oxyCODONE-acetaminophen (PERCOCET/ROXICET) 5-325 MG per tablet Take 1 tablet by mouth every 4 (four) hours as needed for severe pain.    Yes Historical Provider, MD  pantoprazole (PROTONIX) 40 MG tablet Take 1 tablet (40 mg total) by mouth daily. For stomach irritation. 05/14/14  Yes Lennis Marion Downer, MD  prochlorperazine (COMPAZINE) 10 MG tablet Take 1 tablet (10 mg total) by mouth every 6 (six) hours as needed (Nausea or vomiting). 02/16/14  Yes Deatra Robinson, MD  promethazine (PHENERGAN) 25 MG tablet Take 1 tablet (25 mg total) by mouth every 6 (six) hours as needed for nausea or vomiting. 03/25/14  Yes Minette Headland, NP  [place] She receives her chemo treatments at the [facility]. Her oncologist is Dr. Humphrey Rolls. She received her last chemo treatments of Taxotere 16m Injection, Carboplatin 84109m Herceptin 5253mnd Perjeta 420m27m 04/08/14. She also received Neulasta 6mg 83mection on 04/09/14.    Historical Provider, MD   BP 106/64  Pulse 125  Temp(Src) 102.5 F (39.2 C) (Oral)  Resp 29  Ht _0  (1.651 m)  Wt 198 lb (89.812 kg)  BMI 32.95 kg/m2  SpO2 100%  LMP 02/19/2014 Physical Exam  Nursing note and vitals reviewed. Constitutional: She is oriented to person, place, and time. She appears well-developed and well-nourished. No distress.  HENT:  Head: Normocephalic.  Mouth/Throat: Oropharynx is clear and moist.  Eyes: Conjunctivae and EOM are normal. Pupils are equal, round, and reactive to light.  Neck: Normal range of motion.  Cardiovascular: Regular rhythm and intact distal pulses.   Tachycardia  Pulmonary/Chest: Effort normal and breath sounds normal. No stridor. No respiratory distress. She has no wheezes. She has no rales. She exhibits no tenderness.  Tachypnea  Abdominal: Soft. Bowel sounds are normal. She exhibits no  distension and no mass. There is no tenderness. There is no rebound and no guarding.  Musculoskeletal: Normal range of motion. She exhibits no tenderness.  Neurological: She is alert and oriented to person, place, and time.  Psychiatric: She has a normal mood and affect.    ED Course  Procedures (including critical care time) Labs Review Labs Reviewed  CBC WITH DIFFERENTIAL - Abnormal; Notable for the following:    WBC 28.3 (*)    RBC 2.86 (*)    Hemoglobin 8.8 (*)    HCT 26.4 (*)    RDW 21.0 (*)    Neutrophils Relative % 88 (*)    Lymphocytes Relative 5 (*)    Neutro Abs 24.9 (*)    Monocytes  Absolute 2.0 (*)    All other components within normal limits  HEPATIC FUNCTION PANEL - Abnormal; Notable for the following:    Albumin 3.2 (*)    ALT 84 (*)    Alkaline Phosphatase 166 (*)    All other components within normal limits  BASIC METABOLIC PANEL - Abnormal; Notable for the following:    Chloride 95 (*)    Glucose, Bld 112 (*)    Creatinine, Ser 1.40 (*)    GFR calc non Af Amer 46 (*)    GFR calc Af Amer 54 (*)    All other components within normal limits  CULTURE, BLOOD (ROUTINE X 2)  CULTURE, BLOOD (ROUTINE X 2)  URINE CULTURE  CULTURE, EXPECTORATED SPUTUM-ASSESSMENT  LIPASE, BLOOD  PREGNANCY, URINE  URINALYSIS, ROUTINE W REFLEX MICROSCOPIC  I-STAT CG4 LACTIC ACID, ED    Imaging Review Dg Chest 2 View  05/19/2014   CLINICAL DATA:  Chest pain.  Cough.  EXAM: CHEST  2 VIEW  COMPARISON:  CT chest 05/11/2014  FINDINGS: Power port catheter noted with tip projected over right atrium. Minimal subsegmental atelectasis/infiltrate right lung base as noted on recent CT of 05/11/2014. Stable cardiomegaly with normal pulmonary vascularity. No pleural effusion or pneumothorax. No acute bony abnormality .  IMPRESSION: Minimal subsegmental atelectasis and/or infiltrate right lung bases as noted on recent CT of 05/11/2014. No other acute or focal abnormalities.   Electronically Signed    By: Marcello Moores  Register   On: 05/19/2014 16:17     EKG Interpretation   Date/Time:  Wednesday May 19 2014 14:57:37 EDT Ventricular Rate:  106 PR Interval:  128 QRS Duration: 88 QT Interval:  328 QTC Calculation: 435 R Axis:   46 Text Interpretation:  Sinus tachycardia Otherwise normal ECG bigeminy has  resolved Confirmed by GOLDSTON  MD, SCOTT ([id number]) on 05/19/2014 6:50:56 PM      MDM   Final diagnoses:  Sepsis  Breast cancer of upper-outer quadrant of left female breast    Filed Vitals:   05/19/14 1503 05/19/14 1856  BP: 106/64   Pulse: 106 125  Temp: 98.4 F (36.9 C) 102.5 F (39.2 C)  TempSrc: Oral Oral  Resp: 18 29  Height: _0  (1.651 m)   Weight: 198 lb (89.812 kg)   SpO2: 97% 100%    Medications  sodium chloride 0.9 % bolus 2,694 mL (2,694 mLs Intravenous New Bag/Given 05/19/14 1919)    Followed by  0.9 %  sodium chloride infusion (not administered)  vancomycin (VANCOCIN) IVPB 1000 mg/200 mL premix (1,000 mg Intravenous New Bag/Given 05/19/14 1937)  0.9 %  sodium chloride infusion (not administered)    Followed by  0.9 %  sodium chloride infusion (not administered)    Followed by  0.9 %  sodium chloride infusion (not administered)  ceFEPIme (MAXIPIME) 1 g in dextrose 5 % 50 mL IVPB (not administered)  vancomycin (VANCOCIN) IVPB 1000 mg/200 mL premix (not administered)  ceFEPIme (MAXIPIME) 2 g in dextrose 5 % 50 mL IVPB (2 g Intravenous New Bag/Given 05/19/14 1937)  ondansetron (ZOFRAN) injection 4 mg (4 mg Intravenous Given 05/19/14 1938)  ketorolac (TORADOL) 15 MG/ML injection 15 mg (15 mg Intravenous Given 05/19/14 1938)    Tamara [name] is a 43 y.o. female presenting with productive cough, nausea vomiting diarrhea and fever to 102.7. Patient is actively undergoing chemotherapy for breast cancer, she was seen and evaluated after her last chemotherapy and had a CT to rule out PE which was negative. Asian  chest x-ray today shows a staple possible infiltrate on  the right side. Considering her history of cough becoming productive new-onset fever at initiated a HCAP protocol with vancomycin and cefepime. Significant leukocytosis of 28.3. She was seen in ED last week her white blood cell count was normal and she has not had a Neulasta or Neupogen shot in the meantime. Patient also has associated symptoms of nausea vomiting and diarrhea however she has no abdominal pain,  Doubt intra-abdominal/GU source.   Blood work and urinalysis pending. Dr Regenia Skeeter will call for admission.       Monico Blitz, PA-C 05/19/14 2030

## 2014-05-19 NOTE — Progress Notes (Addendum)
ANTIBIOTIC CONSULT NOTE - INITIAL  Pharmacy Consult for vancomycin and cefepime Indication: HCAP  No Known Allergies  Patient Measurements: Height: 5' 5"  (165.1 cm) Weight: 198 lb (89.812 kg) IBW/kg (Calculated) : 57   Vital Signs: Temp: 102.5 F (39.2 C) (06/17 1856) Temp src: Oral (06/17 1856) BP: 106/64 mmHg (06/17 1503) Pulse Rate: 125 (06/17 1856) Intake/Output from previous day:   Intake/Output from this shift:    Labs: No results found for this basename: WBC, HGB, PLT, LABCREA, CREATININE,  in the last 72 hours Estimated Creatinine Clearance: 92 ml/min (by C-G formula based on Cr of 0.9). No results found for this basename: VANCOTROUGH, VANCOPEAK, VANCORANDOM, GENTTROUGH, GENTPEAK, GENTRANDOM, TOBRATROUGH, TOBRAPEAK, TOBRARND, AMIKACINPEAK, AMIKACINTROU, AMIKACIN,  in the last 72 hours   Microbiology: No results found for this or any previous visit (from the past 720 hour(s)).  Medical History: Past Medical History  Diagnosis Date  . Endometriosis   . Hypertension   . Rash     fine rash on abd  . Anemia   . Wears glasses   . 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 Iron deficiency anemia, unspecified 02/25/2014  . BRCA1 positive     c.190T>G (p.Cys64Gly) @ [facility]     Assessment: 19 YOF currently undergoing chemotherapy for breast cancer, seen in the ED with cough and cold symptoms for several days. Was seen at [facility] on 6/9 for similar complaint and CT chest was negative at that time.  SCr on 6/12 was 0.9 and est CrCl is ~1m/min. CBC and blood cultures to be drawn. Last WBC from 6/12 was 13.3 Has vancomycin 1g IV x1 and cefepime 2g IV x1 ordered to be given now  Goal of Therapy:  Vancomycin trough level 15-20 mcg/ml  Plan:  1. Cefepime 1g IV q8h starting tomorrow at 0600 2. Vancomycin 10019mIV q8h starting tomorrow at 0400 3. Follow c/s, clinical progression, LOT, renal function, [place]. Dayven Linsley, PharmD, BCPS Clinical  Pharmacist Pager: [phone]/17/2015 7:09 PM

## 2014-05-19 NOTE — ED Notes (Signed)
First contact with patient. MD informed of pt to activate code sepsis. IV start x2 with bolus started.

## 2014-05-20 ENCOUNTER — Ambulatory Visit: Payer: Medicaid Other

## 2014-05-20 ENCOUNTER — Other Ambulatory Visit: Payer: Medicaid Other

## 2014-05-20 DIAGNOSIS — D62 Acute posthemorrhagic anemia: Secondary | ICD-10-CM

## 2014-05-20 DIAGNOSIS — N179 Acute kidney failure, unspecified: Secondary | ICD-10-CM

## 2014-05-20 LAB — BASIC METABOLIC PANEL
BUN: 12 mg/dL (ref 6–23)
CO2: 17 mEq/L — ABNORMAL LOW (ref 19–32)
CREATININE: 1.11 mg/dL — AB (ref 0.50–1.10)
Calcium: 8 mg/dL — ABNORMAL LOW (ref 8.4–10.5)
Chloride: 103 mEq/L (ref 96–112)
GFR calc non Af Amer: 61 mL/min — ABNORMAL LOW (ref >90)
GFR, EST AFRICAN AMERICAN: 71 mL/min — AB (ref >90)
Glucose, Bld: 144 mg/dL — ABNORMAL HIGH (ref 70–99)
Potassium: 3.6 mEq/L — ABNORMAL LOW (ref 3.7–5.3)
Sodium: 137 mEq/L (ref 137–147)

## 2014-05-20 LAB — ABO/RH: ABO/RH(D): O NEG

## 2014-05-20 LAB — CBC
HCT: 20.1 % — ABNORMAL LOW (ref 36.0–46.0)
Hemoglobin: 6.6 g/dL — CL (ref 12.0–15.0)
MCH: 30.4 pg (ref 26.0–34.0)
MCHC: 32.8 g/dL (ref 30.0–36.0)
MCV: 92.6 fL (ref 78.0–100.0)
Platelets: 188 10*3/uL (ref 150–400)
RBC: 2.17 MIL/uL — ABNORMAL LOW (ref 3.87–5.11)
RDW: 20.7 % — AB (ref 11.5–15.5)
WBC: 22.6 10*3/uL — ABNORMAL HIGH (ref 4.0–10.5)

## 2014-05-20 LAB — RETICULOCYTES
RBC.: 2.88 MIL/uL — AB (ref 3.87–5.11)
RETIC COUNT ABSOLUTE: 20.2 10*3/uL (ref 19.0–186.0)
RETIC CT PCT: 0.7 % (ref 0.4–3.1)

## 2014-05-20 LAB — PREPARE RBC (CROSSMATCH)

## 2014-05-20 LAB — LACTATE DEHYDROGENASE: LDH: 384 U/L — AB (ref 94–250)

## 2014-05-20 LAB — URINE CULTURE
CULTURE: NO GROWTH
Colony Count: NO GROWTH

## 2014-05-20 LAB — HEMOGLOBIN AND HEMATOCRIT, BLOOD
HCT: 20.2 % — ABNORMAL LOW (ref 36.0–46.0)
HEMOGLOBIN: 6.8 g/dL — AB (ref 12.0–15.0)

## 2014-05-20 MED ORDER — IBUPROFEN 800 MG PO TABS
800.0000 mg | ORAL_TABLET | Freq: Once | ORAL | Status: AC
  Administered 2014-05-20: 800 mg via ORAL
  Filled 2014-05-20: qty 1

## 2014-05-20 MED ORDER — SODIUM CHLORIDE 0.9 % IV BOLUS (SEPSIS)
500.0000 mL | Freq: Once | INTRAVENOUS | Status: AC
  Administered 2014-05-20: 500 mL via INTRAVENOUS

## 2014-05-20 MED ORDER — ACETAMINOPHEN 500 MG PO TABS
500.0000 mg | ORAL_TABLET | Freq: Once | ORAL | Status: AC
  Administered 2014-05-20: 500 mg via ORAL
  Filled 2014-05-20: qty 1

## 2014-05-20 MED ORDER — PROMETHAZINE HCL 25 MG/ML IJ SOLN
12.5000 mg | Freq: Once | INTRAMUSCULAR | Status: AC
Start: 2014-05-20 — End: 2014-05-20
  Administered 2014-05-20: 12.5 mg via INTRAVENOUS
  Filled 2014-05-20: qty 1

## 2014-05-20 NOTE — Progress Notes (Signed)
IV team came and was successful at placing an IV to pt's left wrist area. Pt tolerated well. Dr. Sherral Hammers ordered a cooling blanket but none are kept on floor and nurse ordered one but did not receive so ice packs were being used at axillary point on pt. Pt tolerating well. Pt's temperature down to 100.3 axillary and pt is asymptomatic of high temperature. Pt has husband at bedside. Attempted to send a stool sample for occult blood as pt had diarrhea one time but card did not process due to ineffective placement of stool on card and will resend another when pt has another stool.

## 2014-05-20 NOTE — Progress Notes (Signed)
[place] TEAM 1 - Stepdown/ICU TEAM Progress Note  Tamara [name] ERX:[id number] DOB: 1971/06/10 DOA: 05/19/2014 PCP: Angelica Chessman, MD  Admit HPI / Brief Narrative: Tamara [name] is a 43 y.o. 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 She has also been having chest discomfort x 1 week and had a CTA of the Chest performed as an outpatient which was negative for a Pulmonary embolism. She had a temperature to 102 today.    HPI/Subjective: 6/18 CP has eased up, (-) SOB, (+) Fatigue  Assessment/Plan:  Sepsis/HCAP -Blood cultures x2 positive for gram-positive cocci awaiting speciation and sensitivities -Continue IV Vancomycin and Cefepime, and Albuterol nebs, O2 PRN, and IVFs.   Sinus Tachycardia -Resolved -Continue Coreg 3.125 mg  BID   AKI -Continue normal saline 100 ml/hr, patient's BUN/Cr. Improving  HTN -Patient study within normal limits   Iron deficiency Anemia/acute blood loss anemia vs  Destruction 2dary Chemo? -6/18 transfuse 2 unit PRBC Occult Blood  -Anemia workup -Patient's baseline hemoglobin approximately 10 last taken on 6/9 currently at 6.6. - Continue Iron Rx.   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  -Although oncology unlikely to continue chemotherapy while patient is septic will contact them in a.m. as does not appear to have been informed of patient's admission  Code Status: FULL Family Communication: no family present at time of exam Disposition Plan: Resolution sepsis    Consultants: NA  Procedure/Significant Events: 6/17 CXR;Minimal subsegmental atelectasis and/or infiltrate right lung  bases;noted recent CT 05/11/2014.      Culture 6/17 BLOOD ARM LEFT x 2; POSITIVE GRAM  COCCI IN CLUSTERS 6/17 Urine pending    Antibiotics: Cefepime 6/18>> Vacomycin6/18>>    DVT prophylaxis: Mode   Devices None   LINES / TUBES:  6/17 22 ga right hand 6/17 ga left antecubital    Continuous Infusions: . sodium chloride    . sodium chloride 1,000 mL (05/19/14 2104)  . sodium chloride 100 mL/hr at 05/19/14 2336    Objective: VITAL SIGNS: Temp: 99.9 F (37.7 C) (06/18 0416) Temp src: Oral (06/18 0416) BP: 107/48 mmHg (06/18 0600) Pulse Rate: 94 (06/18 0600) SPO2;97 % on RA FIO2:   Intake/Output Summary (Last 24 hours) at 05/20/14 0748 Last data filed at 05/20/14 0700  Gross per 24 hour  Intake   4270 ml  Output      0 ml  Net   4270 ml     Exam: General: A/O x 4, NAD, No acute respiratory distress, (+) Fatigue,  Lungs: Clear to auscultation bilaterally without wheezes or crackles Cardiovascular: Regular rate and rhythm without murmur gallop or rub normal S1 and S2, Indwelling Port Rt Chest Wall w/healed incision Abdomen: Nontender, nondistended, soft, bowel sounds positive, no rebound, no ascites, no appreciable mass Extremities: No significant cyanosis, clubbing, or edema bilateral lower extremities  Data Reviewed: Basic Metabolic Panel:  Recent Labs Lab 05/14/14 1449 05/19/14 1858 05/20/14 0359  NA 137 138 137  K 3.9 4.0 3.6*  CL  --  95* 103  CO2 29 24 17*  GLUCOSE 99 112* 144*  BUN 10.5 14 12   CREATININE 0.9 1.40* 1.11*  CALCIUM 9.2 9.7 8.0*  Liver Function Tests:  Recent Labs Lab 05/14/14 1449 05/19/14 1858  AST 59* 30  ALT 145* 84*  ALKPHOS 91 166*  BILITOT 0.22 0.8  PROT 6.3* 7.7  ALBUMIN 3.2* 3.2*    Recent Labs Lab 05/19/14 1858  LIPASE 39   No results found for this basename: AMMONIA,  in the last 168 hours CBC:  Recent Labs Lab 05/14/14 1449 05/19/14 1858 05/20/14 0359  WBC 13.3* 28.3* 22.6*  NEUTROABS  10.0* 24.9*  --   HGB 10.1* 8.8* 6.6*  HCT 30.4* 26.4* 20.1*  MCV 91.8 92.3 92.6  PLT 369 257 188   Cardiac Enzymes: No results found for this basename: CKTOTAL, CKMB, CKMBINDEX, TROPONINI,  in the last 168 hours BNP (last 3 results)  Recent Labs  05/11/14 1318  PROBNP 173.9*   CBG: No results found for this basename: GLUCAP,  in the last 168 hours  Recent Results (from the past 240 hour(s))  MRSA PCR SCREENING     Status: None   Collection Time    05/19/14  9:51 PM      Result Value Ref Range Status   MRSA by PCR NEGATIVE  NEGATIVE Final   Comment:            The GeneXpert MRSA Assay (FDA     approved for NASAL specimens     only), is one component of a     comprehensive MRSA colonization     surveillance program. It is not     intended to diagnose MRSA     infection nor to guide or     monitor treatment for     MRSA infections.     Studies:  Recent x-ray studies have been reviewed in detail by the Attending Physician  Scheduled Meds:  Scheduled Meds: . carvedilol  3.125 mg Oral BID WC  . ceFEPime (MAXIPIME) IV  1 g Intravenous 3 times per day  . enoxaparin (LOVENOX) injection  40 mg Subcutaneous QHS  . ferrous sulfate  325 mg Oral TID WC  . pantoprazole  40 mg Oral Daily  . vancomycin  1,000 mg Intravenous Q8H    Time spent on care of this patient: 40 mins   Allie Bossier , MD   [facility] Office  [phone] Pager [phone]  On-Call/Text Page:      [email or web address]      password [email or web address]  If 7PM-7AM, please contact night-coverage [email or web address] Password [email or web address] 05/20/2014, 7:48 AM   LOS: 1 day

## 2014-05-20 NOTE — Progress Notes (Signed)
Pt has been afebrile since after 7 am this morning and until now. Pt received one unit of blood by morning RN Megan and pt had no reaction. Took over care after 11 am and pt still afebrile. Pt has been diagnosed as septic and has been running a fever. Pt spiked a temperature in middle of night last night and received tylenol and motrin. Nurse started 2nd unit of blood at 1330 pm and pt afebrile with no reaction. At 1556 pt spiked a temperature of 101. Called and reported to Dr. Sherral Hammers and asked if blood should be stopped. Dr. Sherral Hammers said to keep blood running as Pt is septic and pt has been spiking a fever prior to receiving blood and is positive for gram positive cocci in clusters in blood culture. Pt was asymptomatic when fever spiked. Received orders to give tylenol first and see if temperature would come down. It did not. Temperature increased to 101.7. Gave Motrin PO per Dr. Sherral Hammers orders. Placed Ice packs at axillary point on pt.

## 2014-05-20 NOTE — Progress Notes (Signed)
CRITICAL VALUE ALERT  Critical value received: HgB 6.6   Date of notification:  05/20/14  Time of notification:  0425  Critical value read back: yes  Nurse who received alert: M.Forest Gleason, RN  MD notified (1st page):  Lamar Blinks, NP  Time of first page:    MD notified (2nd page):  Time of second page:  Responding MD:    New orders received.  M.Forest Gleason, RN

## 2014-05-20 NOTE — Progress Notes (Signed)
Pt has a temp of 99.9 oral. She is shaking uncontrollably and HR sustaining 140's-150's.  NP notified. New orders received.    M.Forest Gleason, RN

## 2014-05-20 NOTE — Progress Notes (Signed)
Called and notified Dr. Sherral Hammers that Pt still has a temperature above 101 after receiving both tylenol and motrin. Pt states she has some chills but no other symptoms. Pt's iv no longer working in [facility] of rt arm. Pt has one in right hand. Nurse stated to patient she needed to attempt to place an iv as pt is septic and has a high temperature and needs to have two functioning iv's and pt stated no at first and nurse explained that two iv's are necessary due to an emergent situation and she has fluids, antibiotics and receiving blood. Pt agreed to letting nurse attempt an iv. Nurse attempted a 20 G IV to rt wrist and had blood return but vein blew and infiltrated. Nurse stated she would have IV team come and place and pt insisted she wanted port accessed and refused another attempt for an IV. Nurse called Dr. Sherral Hammers and asked if we could have IV team access port and Dr. Sherral Hammers stated no as pt is septic and to explain again to pt. Nurse explained to pt and stated what Dr. Sherral Hammers said to tell pt that her port could become infected and she needed to let IV team place an IV. Nurse explained this to pt and pt agreed.

## 2014-05-20 NOTE — Progress Notes (Signed)
CRITICAL VALUE ALERT  Critical value received:  Gram + for cocci in clusters  Date of notification:  05/20/14  Time of notification:  6381  Critical value read back:  Nurse who received alert:  Tonette Bihari RN  MD notified (1st page):  Dr. Sherral Hammers  Time of first page:  1340  MD notified (2nd page):  Time of second page:  Responding MD:  Dr. Sherral Hammers  Time MD responded:  1345

## 2014-05-21 ENCOUNTER — Inpatient Hospital Stay (HOSPITAL_COMMUNITY): Payer: Medicaid Other | Admitting: Anesthesiology

## 2014-05-21 ENCOUNTER — Encounter (HOSPITAL_COMMUNITY): Payer: Medicaid Other | Admitting: Anesthesiology

## 2014-05-21 ENCOUNTER — Ambulatory Visit: Payer: Medicaid Other

## 2014-05-21 ENCOUNTER — Encounter (HOSPITAL_COMMUNITY)
Admission: EM | Disposition: A | Discharge status: Home Health | Payer: Self-pay | Source: Home / Self Care | Attending: Internal Medicine

## 2014-05-21 DIAGNOSIS — R059 Cough, unspecified: Secondary | ICD-10-CM

## 2014-05-21 DIAGNOSIS — R079 Chest pain, unspecified: Secondary | ICD-10-CM

## 2014-05-21 DIAGNOSIS — N179 Acute kidney failure, unspecified: Secondary | ICD-10-CM | POA: Diagnosis not present

## 2014-05-21 DIAGNOSIS — A412 Sepsis due to unspecified staphylococcus: Secondary | ICD-10-CM | POA: Diagnosis present

## 2014-05-21 DIAGNOSIS — T80218A Other infection due to central venous catheter, initial encounter: Secondary | ICD-10-CM | POA: Diagnosis not present

## 2014-05-21 DIAGNOSIS — A4901 Methicillin susceptible Staphylococcus aureus infection, unspecified site: Secondary | ICD-10-CM

## 2014-05-21 DIAGNOSIS — R7881 Bacteremia: Secondary | ICD-10-CM

## 2014-05-21 DIAGNOSIS — T827XXA Infection and inflammatory reaction due to other cardiac and vascular devices, implants and grafts, initial encounter: Secondary | ICD-10-CM

## 2014-05-21 DIAGNOSIS — D72829 Elevated white blood cell count, unspecified: Secondary | ICD-10-CM

## 2014-05-21 DIAGNOSIS — T451X5A Adverse effect of antineoplastic and immunosuppressive drugs, initial encounter: Secondary | ICD-10-CM

## 2014-05-21 DIAGNOSIS — C50919 Malignant neoplasm of unspecified site of unspecified female breast: Secondary | ICD-10-CM

## 2014-05-21 DIAGNOSIS — T82898A Other specified complication of vascular prosthetic devices, implants and grafts, initial encounter: Secondary | ICD-10-CM

## 2014-05-21 DIAGNOSIS — A4101 Sepsis due to Methicillin susceptible Staphylococcus aureus: Secondary | ICD-10-CM | POA: Diagnosis not present

## 2014-05-21 DIAGNOSIS — D6481 Anemia due to antineoplastic chemotherapy: Secondary | ICD-10-CM

## 2014-05-21 DIAGNOSIS — C773 Secondary and unspecified malignant neoplasm of axilla and upper limb lymph nodes: Secondary | ICD-10-CM

## 2014-05-21 DIAGNOSIS — Z452 Encounter for adjustment and management of vascular access device: Secondary | ICD-10-CM

## 2014-05-21 DIAGNOSIS — M79609 Pain in unspecified limb: Secondary | ICD-10-CM

## 2014-05-21 DIAGNOSIS — R05 Cough: Secondary | ICD-10-CM

## 2014-05-21 DIAGNOSIS — R0602 Shortness of breath: Secondary | ICD-10-CM

## 2014-05-21 DIAGNOSIS — J189 Pneumonia, unspecified organism: Secondary | ICD-10-CM | POA: Diagnosis not present

## 2014-05-21 HISTORY — PX: PORT-A-CATH REMOVAL: SHX5289

## 2014-05-21 LAB — CBC WITH DIFFERENTIAL/PLATELET
Basophils Absolute: 0 10*3/uL (ref 0.0–0.1)
Basophils Relative: 0 % (ref 0–1)
Eosinophils Absolute: 0 10*3/uL (ref 0.0–0.7)
Eosinophils Relative: 0 % (ref 0–5)
HCT: 25.6 % — ABNORMAL LOW (ref 36.0–46.0)
Hemoglobin: 8.7 g/dL — ABNORMAL LOW (ref 12.0–15.0)
LYMPHS ABS: 1.5 10*3/uL (ref 0.7–4.0)
Lymphocytes Relative: 9 % — ABNORMAL LOW (ref 12–46)
MCH: 30.5 pg (ref 26.0–34.0)
MCHC: 34 g/dL (ref 30.0–36.0)
MCV: 89.8 fL (ref 78.0–100.0)
Monocytes Absolute: 1.2 10*3/uL — ABNORMAL HIGH (ref 0.1–1.0)
Monocytes Relative: 7 % (ref 3–12)
NEUTROS PCT: 83 % — AB (ref 43–77)
Neutro Abs: 13.3 10*3/uL — ABNORMAL HIGH (ref 1.7–7.7)
PLATELETS: 180 10*3/uL (ref 150–400)
RBC: 2.85 MIL/uL — AB (ref 3.87–5.11)
RDW: 20 % — ABNORMAL HIGH (ref 11.5–15.5)
WBC: 16 10*3/uL — AB (ref 4.0–10.5)

## 2014-05-21 LAB — TYPE AND SCREEN
ABO/RH(D): O NEG
Antibody Screen: NEGATIVE
Unit division: 0
Unit division: 0

## 2014-05-21 LAB — HAPTOGLOBIN: Haptoglobin: 429 mg/dL — ABNORMAL HIGH (ref 45–215)

## 2014-05-21 LAB — COMPREHENSIVE METABOLIC PANEL
ALT: 66 U/L — ABNORMAL HIGH (ref 0–35)
AST: 33 U/L (ref 0–37)
Albumin: 2.4 g/dL — ABNORMAL LOW (ref 3.5–5.2)
Alkaline Phosphatase: 171 U/L — ABNORMAL HIGH (ref 39–117)
BUN: 7 mg/dL (ref 6–23)
CALCIUM: 8.7 mg/dL (ref 8.4–10.5)
CO2: 19 mEq/L (ref 19–32)
Chloride: 104 mEq/L (ref 96–112)
Creatinine, Ser: 0.81 mg/dL (ref 0.50–1.10)
GFR calc Af Amer: >90 mL/min (ref >90)
GFR, EST NON AFRICAN AMERICAN: 90 mL/min — AB (ref >90)
GLUCOSE: 102 mg/dL — AB (ref 70–99)
Potassium: 3.6 mEq/L — ABNORMAL LOW (ref 3.7–5.3)
Sodium: 139 mEq/L (ref 137–147)
TOTAL PROTEIN: 6.3 g/dL (ref 6.0–8.3)
Total Bilirubin: 0.4 mg/dL (ref 0.3–1.2)

## 2014-05-21 LAB — MAGNESIUM: Magnesium: 2.2 mg/dL (ref 1.5–2.5)

## 2014-05-21 LAB — VITAMIN B12

## 2014-05-21 LAB — IRON AND TIBC
Iron: 54 ug/dL (ref 42–135)
Saturation Ratios: 30 % (ref 20–55)
TIBC: 178 ug/dL — ABNORMAL LOW (ref 250–470)
UIBC: 124 ug/dL — ABNORMAL LOW (ref 125–400)

## 2014-05-21 LAB — FOLATE: FOLATE: 13.6 ng/mL

## 2014-05-21 LAB — FERRITIN: Ferritin: 1418 ng/mL — ABNORMAL HIGH (ref 10–291)

## 2014-05-21 LAB — VANCOMYCIN, TROUGH: Vancomycin Tr: 12.5 ug/mL (ref 10.0–20.0)

## 2014-05-21 SURGERY — REMOVAL PORT-A-CATH
Anesthesia: General | Site: Chest

## 2014-05-21 MED ORDER — PROMETHAZINE HCL 25 MG/ML IJ SOLN: 6.2500 mg | INTRAMUSCULAR | Status: DC | PRN

## 2014-05-21 MED ORDER — FENTANYL CITRATE 0.05 MG/ML IJ SOLN: 25.0000 ug | INTRAMUSCULAR | Status: DC | PRN

## 2014-05-21 MED ORDER — FENTANYL CITRATE 0.05 MG/ML IJ SOLN
INTRAMUSCULAR | Status: DC | PRN
  Administered 2014-05-21 (×2): 50 ug via INTRAVENOUS
  Administered 2014-05-21: 100 ug via INTRAVENOUS
  Administered 2014-05-21 (×6): 50 ug via INTRAVENOUS

## 2014-05-21 MED ORDER — PROPOFOL 10 MG/ML IV BOLUS
INTRAVENOUS | Status: AC
  Filled 2014-05-21: qty 20

## 2014-05-21 MED ORDER — FENTANYL CITRATE 0.05 MG/ML IJ SOLN
INTRAMUSCULAR | Status: AC
  Filled 2014-05-21: qty 5

## 2014-05-21 MED ORDER — PHENYLEPHRINE 40 MCG/ML (10ML) SYRINGE FOR IV PUSH (FOR BLOOD PRESSURE SUPPORT)
PREFILLED_SYRINGE | INTRAVENOUS | Status: AC
  Filled 2014-05-21: qty 10

## 2014-05-21 MED ORDER — ROCURONIUM BROMIDE 50 MG/5ML IV SOLN
INTRAVENOUS | Status: AC
  Filled 2014-05-21: qty 1

## 2014-05-21 MED ORDER — SUCCINYLCHOLINE CHLORIDE 20 MG/ML IJ SOLN
INTRAMUSCULAR | Status: DC | PRN
Start: 2014-05-21 — End: 2014-05-21
  Administered 2014-05-21: 60 mg via INTRAVENOUS

## 2014-05-21 MED ORDER — SODIUM CHLORIDE 0.9 % IJ SOLN: 3.0000 mL | INTRAMUSCULAR | Status: DC | PRN

## 2014-05-21 MED ORDER — ALBUTEROL SULFATE HFA 108 (90 BASE) MCG/ACT IN AERS
INHALATION_SPRAY | RESPIRATORY_TRACT | Status: DC | PRN
  Administered 2014-05-21 (×3): 2 via RESPIRATORY_TRACT

## 2014-05-21 MED ORDER — OXYCODONE HCL 5 MG PO TABS
5.0000 mg | ORAL_TABLET | ORAL | Status: DC | PRN
  Administered 2014-05-21 – 2014-05-23 (×5): 10 mg via ORAL
  Administered 2014-05-26: 5 mg via ORAL
  Administered 2014-05-27 – 2014-05-28 (×2): 10 mg via ORAL
  Filled 2014-05-21 (×7): qty 2
  Filled 2014-05-21: qty 1
  Filled 2014-05-21: qty 2

## 2014-05-21 MED ORDER — OXYCODONE HCL 5 MG/5ML PO SOLN: 5.0000 mg | Freq: Once | ORAL | Status: DC | PRN

## 2014-05-21 MED ORDER — ACETAMINOPHEN 160 MG/5ML PO SOLN: 325.0000 mg | ORAL | Status: DC | PRN

## 2014-05-21 MED ORDER — VANCOMYCIN HCL 10 G IV SOLR
1250.0000 mg | Freq: Three times a day (TID) | INTRAVENOUS | Status: DC
  Administered 2014-05-21 – 2014-05-22 (×2): 1250 mg via INTRAVENOUS
  Filled 2014-05-21 (×5): qty 1250

## 2014-05-21 MED ORDER — NAFCILLIN SODIUM 2 G IJ SOLR: 2.0000 g | Freq: Four times a day (QID) | INTRAMUSCULAR | Status: DC

## 2014-05-21 MED ORDER — NAFCILLIN SODIUM 2 G IJ SOLR
2.0000 g | INTRAVENOUS | Status: DC
  Administered 2014-05-21 – 2014-05-26 (×28): 2 g via INTRAVENOUS
  Filled 2014-05-21 (×36): qty 2000

## 2014-05-21 MED ORDER — LIDOCAINE HCL (CARDIAC) 20 MG/ML IV SOLN
INTRAVENOUS | Status: DC | PRN
  Administered 2014-05-21: 80 mg via INTRAVENOUS

## 2014-05-21 MED ORDER — NAFCILLIN SODIUM 2 G IJ SOLR
2.0000 g | Freq: Four times a day (QID) | INTRAVENOUS | Status: DC
  Filled 2014-05-21 (×2): qty 2000

## 2014-05-21 MED ORDER — NAFCILLIN SODIUM 2 G IJ SOLR
2.0000 g | Freq: Four times a day (QID) | INTRAMUSCULAR | Status: DC
  Filled 2014-05-21 (×4): qty 2000

## 2014-05-21 MED ORDER — VITAMIN C 500 MG PO TABS
500.0000 mg | ORAL_TABLET | Freq: Three times a day (TID) | ORAL | Status: DC
  Administered 2014-05-21 – 2014-05-28 (×21): 500 mg via ORAL
  Filled 2014-05-21 (×23): qty 1

## 2014-05-21 MED ORDER — SUCCINYLCHOLINE CHLORIDE 20 MG/ML IJ SOLN
INTRAMUSCULAR | Status: AC
  Filled 2014-05-21: qty 1

## 2014-05-21 MED ORDER — MIDAZOLAM HCL 2 MG/2ML IJ SOLN
INTRAMUSCULAR | Status: AC
  Filled 2014-05-21: qty 2

## 2014-05-21 MED ORDER — BUPIVACAINE-EPINEPHRINE 0.5% -1:200000 IJ SOLN
INTRAMUSCULAR | Status: DC | PRN
  Administered 2014-05-21: 20 mL

## 2014-05-21 MED ORDER — OXYCODONE HCL 5 MG PO TABS: 5.0000 mg | ORAL_TABLET | Freq: Once | ORAL | Status: DC | PRN

## 2014-05-21 MED ORDER — LACTATED RINGERS IV SOLN
INTRAVENOUS | Status: DC | PRN
  Administered 2014-05-21: 19:00:00 via INTRAVENOUS

## 2014-05-21 MED ORDER — ACETAMINOPHEN 325 MG PO TABS: 325.0000 mg | ORAL_TABLET | ORAL | Status: DC | PRN

## 2014-05-21 MED ORDER — BUPIVACAINE-EPINEPHRINE (PF) 0.5% -1:200000 IJ SOLN
INTRAMUSCULAR | Status: AC
  Filled 2014-05-21: qty 30

## 2014-05-21 MED ORDER — HEPARIN SOD (PORK) LOCK FLUSH 100 UNIT/ML IV SOLN
250.0000 [IU] | INTRAVENOUS | Status: DC | PRN
  Filled 2014-05-21: qty 3

## 2014-05-21 MED ORDER — PROPOFOL 10 MG/ML IV BOLUS
INTRAVENOUS | Status: DC | PRN
  Administered 2014-05-21: 170 mg via INTRAVENOUS

## 2014-05-21 SURGICAL SUPPLY — 35 items
CHLORAPREP W/TINT 10.5 ML (MISCELLANEOUS) ×3 IMPLANT
COVER SURGICAL LIGHT HANDLE (MISCELLANEOUS) ×3 IMPLANT
DECANTER SPIKE VIAL GLASS SM (MISCELLANEOUS) IMPLANT
DERMABOND ADVANCED (GAUZE/BANDAGES/DRESSINGS)
DERMABOND ADVANCED .7 DNX12 (GAUZE/BANDAGES/DRESSINGS) IMPLANT
DRAPE PED LAPAROTOMY (DRAPES) ×3 IMPLANT
DRAPE UTILITY 15X26 W/TAPE STR (DRAPE) ×6 IMPLANT
ELECT CAUTERY BLADE 6.4 (BLADE) ×3 IMPLANT
ELECT REM PT RETURN 9FT ADLT (ELECTROSURGICAL) ×3
ELECTRODE REM PT RTRN 9FT ADLT (ELECTROSURGICAL) ×1 IMPLANT
GAUZE PACKING IODOFORM 1/2 (PACKING) ×3 IMPLANT
GAUZE SPONGE 4X4 16PLY XRAY LF (GAUZE/BANDAGES/DRESSINGS) ×3 IMPLANT
GLOVE BIO SURGEON STRL SZ8 (GLOVE) ×3 IMPLANT
GLOVE BIOGEL PI IND STRL 8 (GLOVE) ×1 IMPLANT
GLOVE BIOGEL PI INDICATOR 8 (GLOVE) ×2
GOWN STRL REUS W/ TWL LRG LVL3 (GOWN DISPOSABLE) ×1 IMPLANT
GOWN STRL REUS W/ TWL XL LVL3 (GOWN DISPOSABLE) ×1 IMPLANT
GOWN STRL REUS W/TWL LRG LVL3 (GOWN DISPOSABLE) ×2
GOWN STRL REUS W/TWL XL LVL3 (GOWN DISPOSABLE) ×2
KIT BASIN OR (CUSTOM PROCEDURE TRAY) ×3 IMPLANT
KIT ROOM TURNOVER OR (KITS) ×3 IMPLANT
NEEDLE 22X1 1/2 (OR ONLY) (NEEDLE) ×3 IMPLANT
NS IRRIG 1000ML POUR BTL (IV SOLUTION) ×3 IMPLANT
PACK SURGICAL SETUP 50X90 (CUSTOM PROCEDURE TRAY) ×3 IMPLANT
PAD ARMBOARD 7.5X6 YLW CONV (MISCELLANEOUS) ×3 IMPLANT
PENCIL BUTTON HOLSTER BLD 10FT (ELECTRODE) ×3 IMPLANT
SPONGE GAUZE 4X4 12PLY STER LF (GAUZE/BANDAGES/DRESSINGS) ×3 IMPLANT
SUT MON AB 4-0 PC3 18 (SUTURE) ×3 IMPLANT
SUT VIC AB 3-0 SH 27 (SUTURE)
SUT VIC AB 3-0 SH 27XBRD (SUTURE) IMPLANT
SYR CONTROL 10ML LL (SYRINGE) ×3 IMPLANT
TAPE CLOTH SURG 4X10 WHT LF (GAUZE/BANDAGES/DRESSINGS) ×3 IMPLANT
TOWEL OR 17X24 6PK STRL BLUE (TOWEL DISPOSABLE) ×3 IMPLANT
TOWEL OR 17X26 10 PK STRL BLUE (TOWEL DISPOSABLE) ×3 IMPLANT
WATER STERILE IRR 1000ML POUR (IV SOLUTION) IMPLANT

## 2014-05-21 NOTE — ED Provider Notes (Signed)
Medical screening examination/treatment/procedure(s) were conducted as a shared visit with non-physician practitioner(s) and myself.  I personally evaluated the patient during the encounter.   EKG Interpretation   Date/Time:  Wednesday May 19 2014 14:57:37 EDT Ventricular Rate:  106 PR Interval:  128 QRS Duration: 88 QT Interval:  328 QTC Calculation: 435 R Axis:   46 Text Interpretation:  Sinus tachycardia Otherwise normal ECG bigeminy has  resolved Confirmed by GOLDSTON  MD, SCOTT ([id number]) on 05/19/2014 6:50:56 PM       Patient with breast cancer on chemotherapy, with continued cough and chest pain. She is also now with fever and tachycardia. Blood pressure is stable. Given antipyretics, fluids, and broad antibiotics to cover for urine and/or respiratory source.  Ephraim Hamburger, MD 05/21/14 [phone]

## 2014-05-21 NOTE — Op Note (Signed)
05/19/2014 - 05/21/2014  7:49 PM  PATIENT:  Tamara [name]  43 y.o. female  PRE-OPERATIVE DIAGNOSIS:  infected port a cath  POST-OPERATIVE DIAGNOSIS:  infected port a cath  PROCEDURE:  Procedure(s): REMOVAL PORT-A-CATH  SURGEON:  Surgeon(s): Zenovia Jarred, MD  ASSISTANTS: none   ANESTHESIA:   local and general  EBL:     BLOOD ADMINISTERED:none  DRAINS: none   SPECIMEN:  No Specimen  DISPOSITION OF SPECIMEN:  N/A  COUNTS:  YES  DICTATION: .Dragon Dictation Patient is brought for emergent removal of infected Port-A-Cath. She is bacteremic with Staphylococcus. She was identified in the preop holding area. Informed consent was obtained. She is on IV antibiotic protocol. She was brought to the operating room and general endotracheal anesthesia was a Company secretary by the anesthesia staff. Her right chest was prepped and draped in sterile fashion. We did time out procedure. Local anesthetic was injected. Incision was made along her previous scar. We entered a pocket of cloudy fluid. This was sent for culture. The catheter portion was removed from the subclavian vein without difficulty. Pressure was held over the Clavicle for several minutes. The remainder of the port came out easily from the subcutaneous tissues and was removed in its entirety. The wound was copiously irrigated and good hemostasis was obtained. We continued will pressure under the clavicle achieving good hemostasis. The wound was then partly closed medially and laterally with an interrupted 4-0 Monocryl on each side. It was then packed with quarter-inch iodoform gauze. Bulky gauze dressing was applied. She tolerated the procedure well without apparent complication. All counts were correct. She was taken recovery in stable condition.  PATIENT DISPOSITION:  PACU - hemodynamically stable.   Delay start of Pharmacological VTE agent (>24hrs) due to surgical blood loss or risk of bleeding:  no  Georganna Skeans, MD, MPH,  FACS Pager: [phone]  6/19/20157:49 PM

## 2014-05-21 NOTE — Anesthesia Preprocedure Evaluation (Addendum)
Anesthesia Evaluation  Patient identified by MRN, date of birth, ID band Patient awake    Reviewed: Allergy & Precautions, H&P , NPO status , Patient's Chart, lab work & pertinent test results  Airway Mallampati: II TM Distance: >3 FB Neck ROM: Full    Dental  (+) Teeth Intact,    Pulmonary shortness of breath, neg sleep apnea, neg COPDformer smoker,  breath sounds clear to auscultation        Cardiovascular hypertension, Pt. on home beta blockers and Pt. on medications - angina+CHF - Past MI - dysrhythmias - Valvular Problems/MurmursRhythm:Regular     Neuro/Psych negative neurological ROS  negative psych ROS   GI/Hepatic Neg liver ROS, GERD-  Medicated and Controlled,  Endo/Other  neg diabetesMorbid obesity  Renal/GU Renal InsufficiencyRenal disease     Musculoskeletal   Abdominal   Peds  Hematology  (+) anemia ,   Anesthesia Other Findings   Reproductive/Obstetrics                         Anesthesia Physical Anesthesia Plan  ASA: III and emergent  Anesthesia Plan: General   Post-op Pain Management:    Induction: Intravenous and Rapid sequence  Airway Management Planned: Oral ETT  Additional Equipment: None  Intra-op Plan:   Post-operative Plan: Extubation in OR  Informed Consent: I have reviewed the patients History and Physical, chart, labs and discussed the procedure including the risks, benefits and alternatives for the proposed anesthesia with the patient or authorized representative who has indicated his/her understanding and acceptance.   Dental advisory given  Plan Discussed with: CRNA and Surgeon  Anesthesia Plan Comments:        Anesthesia Quick Evaluation

## 2014-05-21 NOTE — Anesthesia Postprocedure Evaluation (Signed)
  Anesthesia Post-op Note  Patient: Tamara [name]  Procedure(s) Performed: Procedure(s): REMOVAL PORT-A-CATH (N/A)  Patient Location: PACU  Anesthesia Type:General  Level of Consciousness: awake and alert   Airway and Oxygen Therapy: Patient Spontanous Breathing and Patient connected to nasal cannula oxygen  Post-op Pain: mild  Post-op Assessment: Post-op Vital signs reviewed, Patient's Cardiovascular Status Stable, Respiratory Function Stable, Patent Airway, No signs of Nausea or vomiting and Pain level controlled  Post-op Vital Signs: Reviewed and stable  Last Vitals:  Filed Vitals:   05/21/14 2036  BP: 134/79  Pulse: 87  Temp:   Resp: 25    Complications: No apparent anesthesia complications

## 2014-05-21 NOTE — Consult Note (Signed)
[facility] day 3 Chemotherapy: carboplatin, taxotere with herceptin and pertuzumab cycle 4 given 05-13-14.   Patient Care Team: Angelica Chessman, MD as PCP - General (Internal Medicine)  CHIEF COMPLAINTS/PURPOSE OF CONSULTATION:  Gram positive sepsis, possible PAC infection.  On neoadjuvant chemotherapy for breast cancer.   HISTORY OF PRESENTING ILLNESS:   Tamara [name] 43 y.o. female with a history of BRCA-1, ER-/PR-/Her2+ Breast Cancer diagnosed 02/20/14 currently on neoadjuvant chemotherapy, most recent  treatment on 05/13/2014, admitted on 6/18 with 2 day history of fever up to 102 Fahrenheit, shortness of breath with deep inspiration, productive cough, and right chest pain for about one week prior, rapid heart rate and generalized weakness. She also complains or right neck tenderness, above the port.  Patient was previously treated for what was thought to be a superficial infection of the [facility], which was felt resolved as of 04/27/14 per EMR information.  She was seen in emergency department on 05-11-14 for central chest pain, at which time a CT angio was negative for pulmonary emboli and no evidence of cardiac etiology for the chest pain. Patient was seen for scheduled visit at [facility] on 05-14-14, which was the first time that this MD had seen her, complaining of continued chest discomfort, not febrile and PAC reportedly bruised from ED access but not painful. Etiology of the chest symptoms were not clear either in ED or at office visit. Labs 05-14-14 returned with new elevation in LFTs, however our office was unable to reach her by phone to follow up afterwards. Patient tells me now that she continued to feel badly after visit on 05-14-14, and developed some fever over several days prior to admission. She has more pain now at [facility] and especially right neck, NP cough with SOB, some nausea, new diarrhea past 3 days tho none today.  During this admission, a  CXR of the chest on 6/18 showed suspected infiltrate in the right lung bases, which was not acute, without any other abnormalities. There was no pleural effusion or pneumothorax . Cultures were obtained from the right port, which was positive for gram positive cocci in clusters.she is currently on Maxipime IV 3 times a day, IV fluids,with close monitoring.   MD note: Antibiotics changed by Infectious Disease after consult earlier today. Per my conversation with Dr Sherral Hammers this AM, I believe that the positive blood cultures were drawn peripherally. The PAC is not accessed presently.  In addition, she was noted to have new severe anemia with a hemoglobin of 6.8, requiring 2 units of blood on admission as the patient was symptomatic. Today, her hemoglobin has responded to the transfusion, at 8.7. Moreover, the patient had leukocytosis, with a white count of 22. She received Neulasta 6 mg on 05/07/2014, tho likely that this leukocytosis is primarily due to infection now. Today her white count is 16 with ANC of 13.3 Her platelets are stable at 180,000.      SUMMARY OF ONCOLOGIC HISTORY: #1 patient palpated a left breast mass in the upper outer quadrant. Mammogram at [facility] 01-18-14 showed a 3.2 cm mass in the 2:00 position 7 cm from the nipple. There was also noted to be an 8 mm mass 8 cm from the nipple in the ipsilateral breast. She was also noted to have positive lymph nodes. She had a biopsy of both masses performed as well as the lymph node. The pathology revealed invasive ductal carcinoma intermediate to high-grade ER negative PR negative HER-2/neu positive  with a proliferation marker Ki-67 79% lymph node was positive for metastatic disease. MRI confirmed 2.8 and 1.3 cm mass is as well as lymph node. On the right he was noted to have a 6 mm nodule. This has been biopsied and is negative.  #2 staging scans: PET scan  1. Hypermetabolic left breast mass consistent with known neoplasm.  There are also  FDG positive left axillary lymph nodes.  2. Benign-appearing brown fat activity and muscular activity in the neck and chest but no findings for metastatic disease involving the neck, chest, abdomen or pelvis.  3. No findings for metastatic bone disease.  4. Moderate FDG activity in the endometrial canal is likely due to secretory phase of ovulation or menses. No mass is seen.  5. Uterine fibroids  CT SCAN: 1. 3 cm left breast mass corresponding to the patient's known neoplasm. Enlarged left axillary lymph nodes are positive on the PET scan.                    2. No CT findings for metastatic disease involving the chest, abdomen or pelvis and no evidence of osseous metastatic disease.                    3. Mildly enlarged fibroid uterus.  #3 genetic testing: Patient is a BRCA1 mutation carrier  #4 echocardiogram: 02/19/2014: Left ventricular ejection fraction 45-50%. Patient was seen by cardiology and they have cleared her for chemotherapy with anti-HER-2-based regimen  #5 neoadjuvant chemotherapy: Patient recommended Taxotere carboplatinum Herceptin and perjeta every 3 weeks for a total of 6 cycles.Cycle 1 began 02-25-14. Last chemotherapy given on 05/06/2014, that is day 1 cycle 4 (she is now Day 16 C4). She was due for C5 on 6/18 but had to be placed on hold due to hospitalization.   MEDICAL HISTORY:  Past Medical History  Diagnosis Date  . Endometriosis   . Hypertension   . Rash     fine rash on abd  . Anemia   . Wears glasses   . 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 Iron deficiency anemia, unspecified 02/25/2014  . BRCA1 positive     c.190T>G (p.Cys64Gly) @ [facility]     SURGICAL HISTORY: Past Surgical History  Procedure Laterality Date  . Cervical polypectomy  2010  . Cesarean section      one previous  . Tubal ligation    . Portacath placement Right 02/23/2014    Procedure: INSERTION PORT-A-CATH;  Surgeon: Joyice Faster. Cornett, MD;  Location: [facility];  Service:  General;  Laterality: Right;   Scheduled Meds: . carvedilol  3.125 mg Oral BID WC  . ceFEPime (MAXIPIME) IV  1 g Intravenous 3 times per day  . enoxaparin (LOVENOX) injection  40 mg Subcutaneous QHS  . ferrous sulfate  325 mg Oral TID WC  . pantoprazole  40 mg Oral Daily  . vancomycin  1,000 mg Intravenous Q8H   Continuous Infusions: . sodium chloride    . sodium chloride 1,000 mL (05/19/14 2104)  . sodium chloride 1,000 mL (05/20/14 2053)   PRN Meds:.acetaminophen, acetaminophen, alum & mag hydroxide-simeth, HYDROmorphone (DILAUDID) injection, loratadine, LORazepam, ondansetron (ZOFRAN) IV, ondansetron, oxyCODONE   SOCIAL HISTORY: History   Social History  . Marital Status: Legally Separated    Spouse Name: N/A    Number of Children: 5  . Years of Education: N/A   Occupational History  . Not on file.   Social History  Main Topics  . Smoking status: Former Smoker -- 0.25 packs/day for 15 years    Quit date: 02/18/2009  . Smokeless tobacco: Former Systems developer  . Alcohol Use: Yes     Comment: occasional  . Drug Use: No  . Sexual Activity: Not Currently    Birth Control/ Protection: Surgical   Other Topics Concern  . Not on file   Social History Narrative  . No narrative on file    FAMILY HISTORY: Family History  Problem Relation Age of Onset  . Breast cancer Mother 41    currently 8  . Diabetes Father   . Pancreatic cancer Father 60  . Breast cancer Paternal Aunt 66    currently 73; BRCA1 positive  . Stroke Maternal Grandfather   . Cancer Paternal Aunt     unk. primary; deceased 35s  . Breast cancer Cousin     daughter of unaffected paternal aunt; dx in her 35s    ALLERGIES:  has No Known Allergies.  MEDICATIONS:  Scheduled Meds: . carvedilol  3.125 mg Oral BID WC  . ceFEPime (MAXIPIME) IV  1 g Intravenous 3 times per day  . enoxaparin (LOVENOX) injection  40 mg Subcutaneous QHS  . ferrous sulfate  325 mg Oral TID WC  . pantoprazole  40 mg Oral Daily  .  vancomycin  1,000 mg Intravenous Q8H   Continuous Infusions: . sodium chloride    . sodium chloride 1,000 mL (05/19/14 2104)  . sodium chloride 1,000 mL (05/20/14 2053)   PRN Meds:.acetaminophen, acetaminophen, alum & mag hydroxide-simeth, HYDROmorphone (DILAUDID) injection, loratadine, LORazepam, ondansetron (ZOFRAN) IV, ondansetron, oxyCODONE  REVIEW OF SYSTEMS:   Denies orthostatic symptoms. PAC not painful. No abdominal pain. No LE swelling. No significant nail changes.taste changed due to chemo. Rest of the review of systems is negative New diarrhea x 3 days. Nausea not controlled with present antiemetics. No appetite, has not taken pos today. No bleeding. More SOB.  PHYSICAL EXAMINATION: ECOG PERFORMANCE STATUS: 1   Filed Vitals:   05/21/14 0800  BP: 127/84  Pulse: 82  Temp: 98.6 F (37 C)  Resp: 39   Filed Weights   05/19/14 1503 05/19/14 2151  Weight: 198 lb (89.812 kg) 197 lb 8.5 oz (89.6 kg)    GENERAL:alert, no distress and comfortable SKIN: skin color, texture, turgor are normal, no rashes or significant lesions EYES: normal, conjunctiva are pink and non-injected, sclera clear OROPHARYNX:no exudate, no erythema and lips, buccal mucosa, and tongue normal  NECK: supple, thyroid normal size, right neck area very tender to palpation LYMPH:  no palpable lymphadenopathy in the cervical, axillary or inguinal LUNGS:  Mild bibasilar crackles, no wheezes. normal breathing effort HEART: regular rate & rhythm and no murmurs and no lower extremity edema ABDOMEN:abdomen soft, non-tender and normal bowel sounds Musculoskeletal:no cyanosis of digits and no clubbing  PSYCH: alert & oriented x 3 with fluent speech NEURO: no focal motor/sensory deficits  right Port A Cath area highly tender to palpation  MD exam 4496: looks acutely ill, rouses to voice and responds appropriately. Pale, not clearly icteric, oral mucosa not dry. PERRL. Right neck very tender. PAC also more tender,  discolored tho not frankly erythematous, is not accessed. Respirations shallow, slight dry cough. Lungs poor inspiration, no wheezes or rales. Heart tachy, regular, no gallop. Peripheral IV infusing med at 100/ hr. Abdomen soft, quiet, not tender, not clearly distended. LE warm, no edema or cords. Moves all extremities easily.    LABORATORY DATA:  I  have reviewed the data as listed Lab Results  Component Value Date   WBC 16.0* 05/21/2014   HGB 8.7* 05/21/2014   HCT 25.6* 05/21/2014   MCV 89.8 05/21/2014   PLT 180 05/21/2014   Lab Results  Component Value Date   NA 139 05/21/2014   K 3.6* 05/21/2014   CL 104 05/21/2014   CO2 19 05/21/2014    RADIOGRAPHIC STUDIES:  Dg Chest 2 View  05/19/2014   CLINICAL DATA:  Chest pain.  Cough.  EXAM: CHEST  2 VIEW  COMPARISON:  CT chest 05/11/2014  FINDINGS: Power port catheter noted with tip projected over right atrium. Minimal subsegmental atelectasis/infiltrate right lung base as noted on recent CT of 05/11/2014. Stable cardiomegaly with normal pulmonary vascularity. No pleural effusion or pneumothorax. No acute bony abnormality .  IMPRESSION: Minimal subsegmental atelectasis and/or infiltrate right lung bases as noted on recent CT of 05/11/2014. No other acute or focal abnormalities.   Electronically Signed   By: Marcello Moores  Register   On: 05/19/2014 16:17   Dg Chest 2 View  05/11/2014   CLINICAL DATA:  Mid chest pain for 4 days, history of breast malignancy.  EXAM: CHEST  2 VIEW  COMPARISON:  PA and lateral chest x-ray of Apr 13, 2014.  FINDINGS: The lungs are adequately inflated. There is no focal infiltrate. The heart is top-normal in size but stable. The pulmonary vascularity is not engorged. The Port-A-Cath appliance is unchanged. There is no pleural effusion. The bony thorax is unremarkable.  IMPRESSION: There is no acute cardiopulmonary disease.   Electronically Signed   By: David  Martinique   On: 05/11/2014 13:57   Ct Angio Chest W/cm &/or Wo Cm  05/11/2014    CLINICAL DATA:  Shortness of breath and chest pain  EXAM: CT ANGIOGRAPHY CHEST WITH CONTRAST  TECHNIQUE: Multidetector CT imaging of the chest was performed using the standard protocol during bolus administration of intravenous contrast. Multiplanar CT image reconstructions and MIPs were obtained to evaluate the vascular anatomy.  CONTRAST:  122m OMNIPAQUE IOHEXOL 350 MG/ML SOLN  COMPARISON:  Chest radiograph May 11, 2012 and chest CT Apr 13, 2014  FINDINGS: There is no demonstrable pulmonary embolus. There is no thoracic aortic aneurysm or dissection.  There has been slight partial clearing of infiltrate from the posterior right base compared to most recent prior CT examination. Some residual opacity remains in the posterior segment right lower lobe. The previously noted nodular opacity posterior left base currently measures 6 x 3 mm, smaller than on the previous study. It is seen on axial slice 39, series 7. There is no new nodular opacity or airspace consolidation apparent. There is a minimal bullous disease in the right lower lobe.  Central catheter tip is in the right atrium. There is no appreciable pneumothorax.  There is no appreciable thoracic adenopathy. The pericardium is slightly thickened inferiorly, a stable finding. Heart is slightly enlarged.  Visualized upper abdominal structures appear unremarkable. There are no blastic or lytic bone lesions.  Review of the MIP images confirms the above findings.  IMPRESSION: No demonstrable pulmonary embolus.  The area of previously noted infiltrate in the posterior right base has shown mild partial but incomplete clearing. There is still residual consolidation in a small portion of the posterior segment of the right lower lobe.  The nodular opacity in the posterior left base has become slightly smaller.  No new areas of infiltrate or nodularity identified.  No adenopathy.  Heart is prominent but stable. No  appreciable pericardial effusion. Slight inferior  pericardial thickening is stable.   Electronically Signed   By: Lowella Grip M.D.   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 She is due cycle 5 on 05-27-14. She has appointment for lab and provider day of treatment.  She is day 16 C4 post chemo (6/4) with Neulasta on day 2 (6/5)  2. Sepsis in the setting of recurrent, infected port:  Cultures positive for Staph infection, sensitivities pending. Antibiotics per ID.  Agree with ID recommendation for imaging for back pain; may need additional imaging of chest also. .  MD note: Needs PAC removed as soon as possible. Dr Grandville Silos on call for surgery to see now.  3. Recent new elevation in LFTs likely related to infection.   4.BRCA 1 positive    5. Leukocytosis In the setting of sepsis. I doubt neulasta 6-5 or steroids with last chemo contributing now.  6. Symptomatic anemia In the setting of recent chemotherapy, and acute infection-sepsis. The patient received 2 units of blood on 05/20/2014 with good response, from 6.8 to 8.7 today She is on oral iron/feraheme as outpatient, last dose on 02/26/14.  Hemoccult pending.  7. DVT prophylaxis Lovenox per Pharmacy Neck pain is being evaluated by ultrasound to rule out clot/DVT. Doppler seems to have been of RUE only; I do not find [place] of neck.   8. Full Code  All other medical issues as per admitting team.     Rondel Jumbo, PA-C @T @ 8:54 AM   This MD notified of admission and discussed case with hospitalist this AM. Exam and note by APP earlier today above. Patient seen now by this MD, looks much worse than when I had seen her for first time last week; APP consult note edited by MD now. I have spoken with Dr Georganna Skeans by phone and in person now. He is taking patient to OR to remove PAC immediately.  Our service will follow  with you. Please call between medical oncology rounds if needed   [phone]. Thank you L.Marko Plume, MD

## 2014-05-21 NOTE — Progress Notes (Signed)
[facility] TEAM 1 - Stepdown/ICU TEAM Progress Note  Ziaire Bieser FHL:[id number] DOB: [birth year] DOA: 05/19/2014 PCP: Angelica Chessman, MD  Admit HPI / Brief Narrative: Tamara [name] is a 43 y.o. 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 She has also been having chest discomfort x 1 week and had a CTA of the Chest performed as an outpatient which was negative for a Pulmonary embolism. She had a temperature to 102 today.    HPI/Subjective: 6/19 CP has eased up, (+) mild SOB improved from 6/18, (+) Fatigue but improved from 6/18  Assessment/Plan:  Sepsis/HCAP -Blood cultures x2 positive for Staphylococcus, awaiting sensitivities -Continue IV Vancomycin and Cefepime, and Albuterol nebs, O2 PRN, and IVFs.  -Spoke with Dr. Evlyn Clines (oncology), spoke about my concern that patient's port may be the cause of her current sepsis. Will wait oncology's decision on removing port, continue current antibiotic therapy  Sinus Tachycardia -Resolved -Continue Coreg 3.125 mg  BID   AKI -Continue normal saline 100 ml/hr, patient's BUN/Cr. Normalized  HTN -Within AHA guidelines continue Coreg 3.111m BID  Iron deficiency Anemia/acute blood loss anemia vs  Destruction 2dary Chemo? -6/18 transfuse 2 unit PRBC Occult Blood  -Anemia workup; appears patient has mixed anemia  -Patient's baseline hemoglobin approximately 10 last taken on 6/9 currently at 6.6. - Continue Iron Rx. 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  -Will await oncology decision on removing Port-A-Cath, continuing chemotherapy   Neck/right shoulder  pain -Patient extremely tender along lateral aspect of right neck just superior to, bone. Unfortunately this is also the side where patient's port resides concern of possible  Thrombus. Obtain ultrasound     Code Status: FULL Family Communication: no family present at time of exam Disposition Plan: Resolution sepsis; possible Port-A-Cath removal?    Consultants: Dr. LEvlyn Clines(oncology),    Procedure/Significant Events: 6/17 CXR;Minimal subsegmental atelectasis and/or infiltrate right lung bases;noted recent CT 05/11/2014.      Culture 6/17 BLOOD ARM LEFT x 2; POSITIVE GRAM  COCCI IN CLUSTERS 6/17 Urine pending    Antibiotics: Cefepime 6/18>> Vacomycin6/18>>    DVT prophylaxis: Lovenox   Devices Right chest wall Port-A-Cath   LINES / TUBES:  6/17 22 ga right hand 6/17 ga left antecubital    Continuous Infusions: . sodium chloride    . sodium chloride 1,000 mL (05/19/14 2104)  . sodium chloride 1,000 mL (05/20/14 2053)    Objective: VITAL SIGNS: Temp: 98.6 F (37 C) (06/19 0800) Temp src: Oral (06/19 0800) BP: 127/84 mmHg (06/19 0800) Pulse Rate: 82 (06/19 0800) SPO2;96 % on RA FIO2:   Intake/Output Summary (Last 24 hours) at 05/21/14 1104 Last data filed at 05/21/14 0900  Gross per 24 hour  Intake 3882.67 ml  Output   1000 ml  Net 2882.67 ml     Exam: General: A/O x 4, NAD, No acute respiratory distress, (+) Fatigue, extremely tender to palpation just superior to right collar bone extending to just under right ear, possible palpated cord. Lungs: Clear to auscultation bilaterally mild bibasilar crackles  Cardiovascular: Regular rate and rhythm without murmur gallop or rub normal S1 and  S2, Indwelling Port Rt Chest Wall w/healed incision (exquisitely tender to palpation) Abdomen: Nontender, nondistended, soft, bowel sounds positive, no rebound, no ascites, no appreciable mass Extremities: No significant cyanosis, clubbing, or edema  bilateral lower extremities  Data Reviewed: Basic Metabolic Panel:  Recent Labs Lab 05/14/14 1449 05/19/14 1858 05/20/14 0359 05/21/14 0546  NA 137 138 137 139  K 3.9 4.0 3.6* 3.6*  CL  --  95* 103 104  CO2 29 24 17* 19  GLUCOSE 99 112* 144* 102*  BUN 10.5 14 12 7   CREATININE 0.9 1.40* 1.11* 0.81  CALCIUM 9.2 9.7 8.0* 8.7  MG  --   --   --  2.2   Liver Function Tests:  Recent Labs Lab 05/14/14 1449 05/19/14 1858 05/21/14 0546  AST 59* 30 33  ALT 145* 84* 66*  ALKPHOS 91 166* 171*  BILITOT 0.22 0.8 0.4  PROT 6.3* 7.7 6.3  ALBUMIN 3.2* 3.2* 2.4*    Recent Labs Lab 05/19/14 1858  LIPASE 39   No results found for this basename: AMMONIA,  in the last 168 hours CBC:  Recent Labs Lab 05/14/14 1449 05/19/14 1858 05/20/14 0359 05/20/14 0947 05/21/14 0812  WBC 13.3* 28.3* 22.6*  --  16.0*  NEUTROABS 10.0* 24.9*  --   --  13.3*  HGB 10.1* 8.8* 6.6* 6.8* 8.7*  HCT 30.4* 26.4* 20.1* 20.2* 25.6*  MCV 91.8 92.3 92.6  --  89.8  PLT 369 257 188  --  180   Cardiac Enzymes: No results found for this basename: CKTOTAL, CKMB, CKMBINDEX, TROPONINI,  in the last 168 hours BNP (last 3 results)  Recent Labs  05/11/14 1318  PROBNP 173.9*   CBG: No results found for this basename: GLUCAP,  in the last 168 hours  Recent Results (from the past 240 hour(s))  CULTURE, BLOOD (ROUTINE X 2)     Status: None   Collection Time    05/19/14  6:55 PM      Result Value Ref Range Status   Specimen Description BLOOD ARM LEFT   Final   Special Requests BOTTLES DRAWN AEROBIC AND ANAEROBIC 5CC   Final   Culture  Setup Time     Final   Value: 05/20/2014 01:19     Performed at [facility]   Culture     Final   Value: STAPHYLOCOCCUS AUREUS     Note: RIFAMPIN AND GENTAMICIN SHOULD NOT BE USED AS SINGLE DRUGS FOR TREATMENT OF STAPH INFECTIONS.     Note: Gram Stain Report Called to,Read Back By and Verified With: DARA A@1 :15PM ON 05/20/14 BY DANTS     Performed at [facility]   Report Status PENDING   Incomplete  CULTURE, BLOOD (ROUTINE X 2)     Status: None   Collection Time    05/19/14  7:27 PM      Result Value Ref Range Status   Specimen Description BLOOD ARM LEFT   Final   Special Requests BOTTLES DRAWN AEROBIC AND ANAEROBIC 10CC   Final   Culture  Setup Time     Final   Value: 05/20/2014 01:19     Performed at [facility]   Culture     Final   Value: STAPHYLOCOCCUS AUREUS     Note: Gram Stain Report Called to,Read Back By and Verified With: DARA A@1 :15PM ON 05/20/14 BY DANTS     Performed at [facility]   Report Status PENDING   Incomplete  URINE CULTURE  Status: None   Collection Time    05/19/14  8:01 PM      Result Value Ref Range Status   Specimen Description URINE, RANDOM   Final   Special Requests NONE   Final   Culture  Setup Time     Final   Value: 05/19/2014 20:34     Performed at [place]     Final   Value: NO GROWTH     Performed at [facility]   Culture     Final   Value: NO GROWTH     Performed at [facility]   Report Status 05/20/2014 FINAL   Final  MRSA PCR SCREENING     Status: None   Collection Time    05/19/14  9:51 PM      Result Value Ref Range Status   MRSA by PCR NEGATIVE  NEGATIVE Final   Comment:            The GeneXpert MRSA Assay (FDA     approved for NASAL specimens     only), is one component of a     comprehensive MRSA colonization     surveillance program. It is not     intended to diagnose MRSA     infection nor to guide or     monitor treatment for     MRSA infections.     Studies:  Recent x-ray studies have been reviewed in detail by the Attending Physician  Scheduled Meds:  Scheduled Meds: . carvedilol  3.125 mg Oral BID WC  . ceFEPime (MAXIPIME) IV  1 g Intravenous 3 times per day  . enoxaparin (LOVENOX) injection  40 mg Subcutaneous QHS  . ferrous sulfate  325 mg Oral TID WC  . pantoprazole  40 mg Oral Daily  .  vancomycin  1,000 mg Intravenous Q8H  . vitamin C  500 mg Oral TID    Time spent on care of this patient: 40 mins   Allie Bossier , MD   [facility] Office  [phone] Pager - [phone]  On-Call/Text Page:      [email or web address]      password [email or web address]  If 7PM-7AM, please contact night-coverage [email or web address] Password [email or web address] 05/21/2014, 11:04 AM   LOS: 2 days

## 2014-05-21 NOTE — Transfer of Care (Signed)
Immediate Anesthesia Transfer of Care Note  Patient: Tamara [name]  Procedure(s) Performed: Procedure(s): REMOVAL PORT-A-CATH (N/A)  Patient Location: PACU  Anesthesia Type:General  Level of Consciousness: responds to stimulation  Airway & Oxygen Therapy: Patient Spontanous Breathing and Patient connected to face mask oxygen  Post-op Assessment: Report given to PACU RN and Post -op Vital signs reviewed and stable  Post vital signs: Reviewed and stable  Complications: No apparent anesthesia complications

## 2014-05-21 NOTE — Consult Note (Signed)
Reason for Consult:infected port-a-cath, bacteremia Referring Physician: Evlyn Clines Tamara [name] is an 43 y.o. female.  HPI: Patient underwent Port-A-Cath placement in March of this year by Dr. Erroll Luna. She is undergoing neoadjuvant therapy for breast cancer. She developed a superficial infection at the port site but this seemed to resolve with antibiotic treatment. She was admitted With fever and pain at the Port-A-Cath site. She has leukocytosis. Blood cultures have shown Staph bacteremia. I was asked to see her to remove her port. She complains of chills at times, also having pain at the port site.   Past Medical History  Diagnosis Date  . Endometriosis   . Hypertension   . Rash     fine rash on abd  . Anemia   . Wears glasses   . 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 Iron deficiency anemia, unspecified 02/25/2014  . BRCA1 positive     c.190T>G (p.Cys64Gly) @ [facility]     Past Surgical History  Procedure Laterality Date  . Cervical polypectomy  2010  . Cesarean section      one previous  . Tubal ligation    . Portacath placement Right 02/23/2014    Procedure: INSERTION PORT-A-CATH;  Surgeon: Joyice Faster. Cornett, MD;  Location: [facility];  Service: General;  Laterality: Right;    Family History  Problem Relation Age of Onset  . Breast cancer Mother 43    currently 72  . Diabetes Father   . Pancreatic cancer Father 74  . Breast cancer Paternal Aunt 53    currently 64; BRCA1 positive  . Stroke Maternal Grandfather   . Cancer Paternal Aunt     unk. primary; deceased 37s  . Breast cancer Cousin     daughter of unaffected paternal aunt; dx in her 30s    Social History:  reports that she quit smoking about 5 years ago. She has quit using smokeless tobacco. She reports that she drinks alcohol. She reports that she does not use illicit drugs.  Allergies: No Known Allergies  Medications:  Scheduled: . carvedilol  3.125 mg Oral BID WC  . enoxaparin  (LOVENOX) injection  40 mg Subcutaneous QHS  . ferrous sulfate  325 mg Oral TID WC  . nafcillin IV  2 g Intravenous 6 times per day  . pantoprazole  40 mg Oral Daily  . vancomycin  1,000 mg Intravenous Q8H  . vitamin C  500 mg Oral TID   Continuous: . sodium chloride    . sodium chloride 1,000 mL (05/19/14 2104)  . sodium chloride 1,000 mL (05/20/14 2053)   XVQ:[id number], acetaminophen, alum & mag hydroxide-simeth, HYDROmorphone (DILAUDID) injection, loratadine, LORazepam, ondansetron (ZOFRAN) IV, ondansetron, oxyCODONE  Results for orders placed during the hospital encounter of 05/19/14 (from the past 48 hour(s))  CULTURE, BLOOD (ROUTINE X 2)     Status: None   Collection Time    05/19/14  6:55 PM      Result Value Ref Range   Specimen Description BLOOD ARM LEFT     Special Requests BOTTLES DRAWN AEROBIC AND ANAEROBIC 5CC     Culture  Setup Time       Value: 05/20/2014 01:19     Performed at [facility]   Culture       Value: STAPHYLOCOCCUS AUREUS     Note: RIFAMPIN AND GENTAMICIN SHOULD NOT BE USED AS SINGLE DRUGS FOR TREATMENT OF STAPH INFECTIONS.     Note: Gram Stain Report Called to,Read Back  By and Verified With: DARA A@1 :15PM ON 05/20/14 BY DANTS     Performed at [facility]   Report Status PENDING    CBC WITH DIFFERENTIAL     Status: Abnormal   Collection Time    05/19/14  6:58 PM      Result Value Ref Range   WBC 28.3 (*) 4.0 - 10.5 K/uL   RBC 2.86 (*) 3.87 - 5.11 MIL/uL   Hemoglobin 8.8 (*) 12.0 - 15.0 g/dL   HCT 26.4 (*) 36.0 - 46.0 %   MCV 92.3  78.0 - 100.0 fL   MCH 30.8  26.0 - 34.0 pg   MCHC 33.3  30.0 - 36.0 g/dL   RDW 21.0 (*) 11.5 - 15.5 %   Platelets 257  150 - 400 K/uL   Neutrophils Relative % 88 (*) 43 - 77 %   Lymphocytes Relative 5 (*) 12 - 46 %   Monocytes Relative 7  3 - 12 %   Eosinophils Relative 0  0 - 5 %   Basophils Relative 0  0 - 1 %   Neutro Abs 24.9 (*) 1.7 - 7.7 K/uL   Lymphs Abs 1.4  0.7 - 4.0 K/uL   Monocytes  Absolute 2.0 (*) 0.1 - 1.0 K/uL   Eosinophils Absolute 0.0  0.0 - 0.7 K/uL   Basophils Absolute 0.0  0.0 - 0.1 K/uL   RBC Morphology POLYCHROMASIA PRESENT     WBC Morphology ATYPICAL LYMPHOCYTES     Comment: TOXIC GRANULATION   Smear Review LARGE PLATELETS PRESENT    LIPASE, BLOOD     Status: None   Collection Time    05/19/14  6:58 PM      Result Value Ref Range   Lipase 39  11 - 59 U/L  HEPATIC FUNCTION PANEL     Status: Abnormal   Collection Time    05/19/14  6:58 PM      Result Value Ref Range   Total Protein 7.7  6.0 - 8.3 g/dL   Albumin 3.2 (*) 3.5 - 5.2 g/dL   AST 30  0 - 37 U/L   ALT 84 (*) 0 - 35 U/L   Alkaline Phosphatase 166 (*) 39 - 117 U/L   Total Bilirubin 0.8  0.3 - 1.2 mg/dL   Bilirubin, Direct 0.3  0.0 - 0.3 mg/dL   Indirect Bilirubin 0.5  0.3 - 0.9 mg/dL  BASIC METABOLIC PANEL     Status: Abnormal   Collection Time    05/19/14  6:58 PM      Result Value Ref Range   Sodium 138  137 - 147 mEq/L   Potassium 4.0  3.7 - 5.3 mEq/L   Chloride 95 (*) 96 - 112 mEq/L   CO2 24  19 - 32 mEq/L   Glucose, Bld 112 (*) 70 - 99 mg/dL   BUN 14  6 - 23 mg/dL   Creatinine, Ser 1.40 (*) 0.50 - 1.10 mg/dL   Calcium 9.7  8.4 - 10.5 mg/dL   GFR calc non Af Amer 46 (*) >90 mL/min   GFR calc Af Amer 54 (*) >90 mL/min   Comment: (NOTE)     The eGFR has been calculated using the CKD EPI equation.     This calculation has not been validated in all clinical situations.     eGFR's persistently <90 mL/min signify possible Chronic Kidney     Disease.  CULTURE, BLOOD (ROUTINE X 2)     Status: None  Collection Time    05/19/14  7:27 PM      Result Value Ref Range   Specimen Description BLOOD ARM LEFT     Special Requests BOTTLES DRAWN AEROBIC AND ANAEROBIC 10CC     Culture  Setup Time       Value: 05/20/2014 01:19     Performed at [facility]   Culture       Value: STAPHYLOCOCCUS AUREUS     Note: Gram Stain Report Called to,Read Back By and Verified With: DARA A@1 :15PM  ON 05/20/14 BY DANTS     Performed at [facility]   Report Status PENDING    I-STAT CG4 LACTIC ACID, ED     Status: None   Collection Time    05/19/14  7:37 PM      Result Value Ref Range   Lactic Acid, Venous 1.68  0.5 - 2.2 mmol/L  URINALYSIS, ROUTINE W REFLEX MICROSCOPIC     Status: Abnormal   Collection Time    05/19/14  8:01 PM      Result Value Ref Range   Color, Urine AMBER (*) YELLOW   Comment: BIOCHEMICALS MAY BE AFFECTED BY COLOR   APPearance HAZY (*) CLEAR   Specific Gravity, Urine 1.022  1.005 - 1.030   pH 5.5  5.0 - 8.0   Glucose, UA NEGATIVE  NEGATIVE mg/dL   Hgb urine dipstick SMALL (*) NEGATIVE   Bilirubin Urine SMALL (*) NEGATIVE   Ketones, ur 15 (*) NEGATIVE mg/dL   Protein, ur 100 (*) NEGATIVE mg/dL   Urobilinogen, UA 1.0  0.0 - 1.0 mg/dL   Nitrite NEGATIVE  NEGATIVE   Leukocytes, UA MODERATE (*) NEGATIVE  URINE CULTURE     Status: None   Collection Time    05/19/14  8:01 PM      Result Value Ref Range   Specimen Description URINE, RANDOM     Special Requests NONE     Culture  Setup Time       Value: 05/19/2014 20:34     Performed at [place]       Value: NO GROWTH     Performed at [facility]   Culture       Value: NO GROWTH     Performed at [facility]   Report Status 05/20/2014 FINAL    PREGNANCY, URINE     Status: None   Collection Time    05/19/14  8:01 PM      Result Value Ref Range   Preg Test, Ur NEGATIVE  NEGATIVE   Comment:            THE SENSITIVITY OF THIS     METHODOLOGY IS >20 mIU/mL.  URINE MICROSCOPIC-ADD ON     Status: Abnormal   Collection Time    05/19/14  8:01 PM      Result Value Ref Range   Squamous Epithelial / LPF FEW (*) RARE   WBC, UA TOO NUMEROUS TO COUNT  <3 WBC/hpf   RBC / HPF 0-2  <3 RBC/hpf   Bacteria, UA MANY (*) RARE   Casts GRANULAR CAST (*) NEGATIVE   Urine-Other MUCOUS PRESENT    MRSA PCR SCREENING     Status: None   Collection Time    05/19/14  9:51  PM      Result Value Ref Range   MRSA by PCR NEGATIVE  NEGATIVE   Comment:  The GeneXpert MRSA Assay (FDA     approved for NASAL specimens     only), is one component of a     comprehensive MRSA colonization     surveillance program. It is not     intended to diagnose MRSA     infection nor to guide or     monitor treatment for     MRSA infections.  BASIC METABOLIC PANEL     Status: Abnormal   Collection Time    05/20/14  3:59 AM      Result Value Ref Range   Sodium 137  137 - 147 mEq/L   Potassium 3.6 (*) 3.7 - 5.3 mEq/L   Chloride 103  96 - 112 mEq/L   Comment: DELTA CHECK NOTED   CO2 17 (*) 19 - 32 mEq/L   Glucose, Bld 144 (*) 70 - 99 mg/dL   BUN 12  6 - 23 mg/dL   Creatinine, Ser 1.11 (*) 0.50 - 1.10 mg/dL   Calcium 8.0 (*) 8.4 - 10.5 mg/dL   GFR calc non Af Amer 61 (*) >90 mL/min   GFR calc Af Amer 71 (*) >90 mL/min   Comment: (NOTE)     The eGFR has been calculated using the CKD EPI equation.     This calculation has not been validated in all clinical situations.     eGFR's persistently <90 mL/min signify possible Chronic Kidney     Disease.  CBC     Status: Abnormal   Collection Time    05/20/14  3:59 AM      Result Value Ref Range   WBC 22.6 (*) 4.0 - 10.5 K/uL   RBC 2.17 (*) 3.87 - 5.11 MIL/uL   Hemoglobin 6.6 (*) 12.0 - 15.0 g/dL   Comment: REPEATED TO VERIFY     CRITICAL RESULT CALLED TO, READ BACK BY AND VERIFIED WITH:     M.PIEL RN 0421 05/20/14 E.GADDY   HCT 20.1 (*) 36.0 - 46.0 %   MCV 92.6  78.0 - 100.0 fL   MCH 30.4  26.0 - 34.0 pg   MCHC 32.8  30.0 - 36.0 g/dL   RDW 20.7 (*) 11.5 - 15.5 %   Platelets 188  150 - 400 K/uL   Comment: REPEATED TO VERIFY     DELTA CHECK NOTED  PREPARE RBC (CROSSMATCH)     Status: None   Collection Time    05/20/14  7:05 AM      Result Value Ref Range   Order Confirmation ORDER PROCESSED BY BLOOD BANK    TYPE AND SCREEN     Status: None   Collection Time    05/20/14  7:05 AM      Result Value Ref Range     ABO/RH(D) O NEG     Antibody Screen NEG     Sample Expiration 05/23/2014     Unit Number [id number]     Blood Component Type RBC LR PHER1     Unit division 00     Status of Unit ISSUED,FINAL     Transfusion Status OK TO TRANSFUSE     Crossmatch Result Compatible     Unit Number [id number]     Blood Component Type RED CELLS,LR     Unit division 00     Status of Unit ISSUED,FINAL     Transfusion Status OK TO TRANSFUSE     Crossmatch Result Compatible    ABO/RH     Status: None   Collection  Time    05/20/14  7:05 AM      Result Value Ref Range   ABO/RH(D) O NEG    HEMOGLOBIN AND HEMATOCRIT, BLOOD     Status: Abnormal   Collection Time    05/20/14  9:47 AM      Result Value Ref Range   Hemoglobin 6.8 (*) 12.0 - 15.0 g/dL   Comment: CRITICAL VALUE NOTED.  VALUE IS CONSISTENT WITH PREVIOUSLY REPORTED AND CALLED VALUE.   HCT 20.2 (*) 36.0 - 46.0 %  PREPARE RBC (CROSSMATCH)     Status: None   Collection Time    05/20/14 10:00 AM      Result Value Ref Range   Order Confirmation ORDER PROCESSED BY BLOOD BANK    VITAMIN B12     Status: Abnormal   Collection Time    05/20/14  7:09 PM      Result Value Ref Range   Vitamin B-12 >2000 (*) 211 - 911 pg/mL   Comment: Performed at [facility]     Status: None   Collection Time    05/20/14  7:09 PM      Result Value Ref Range   Folate 13.6     Comment: (NOTE)     Reference Ranges            Deficient:       0.4 - 3.3 ng/mL            Indeterminate:   3.4 - 5.4 ng/mL            Normal:              > 5.4 ng/mL     Performed at [place] TIBC     Status: Abnormal   Collection Time    05/20/14  7:09 PM      Result Value Ref Range   Iron 54  42 - 135 ug/dL   TIBC 178 (*) 250 - 470 ug/dL   Saturation Ratios 30  20 - 55 %   UIBC 124 (*) 125 - 400 ug/dL   Comment: Performed at [facility]     Status: Abnormal   Collection Time    05/20/14  7:09 PM      Result Value  Ref Range   Ferritin 1418 (*) 10 - 291 ng/mL   Comment: Performed at [place]     Status: Abnormal   Collection Time    05/20/14  7:09 PM      Result Value Ref Range   Retic Ct Pct 0.7  0.4 - 3.1 %   RBC. 2.88 (*) 3.87 - 5.11 MIL/uL   Retic Count, Manual 20.2  19.0 - 186.0 K/uL  HAPTOGLOBIN     Status: Abnormal   Collection Time    05/20/14  7:09 PM      Result Value Ref Range   Haptoglobin 429 (*) 45 - 215 mg/dL   Comment: Result confirmed by automatic dilution.     Performed at [facility] DEHYDROGENASE     Status: Abnormal   Collection Time    05/20/14  7:09 PM      Result Value Ref Range   LDH 384 (*) 94 - 250 U/L  COMPREHENSIVE METABOLIC PANEL     Status: Abnormal   Collection Time    05/21/14  5:46 AM      Result Value Ref Range  Sodium 139  137 - 147 mEq/L   Potassium 3.6 (*) 3.7 - 5.3 mEq/L   Chloride 104  96 - 112 mEq/L   CO2 19  19 - 32 mEq/L   Glucose, Bld 102 (*) 70 - 99 mg/dL   BUN 7  6 - 23 mg/dL   Creatinine, Ser 0.81  0.50 - 1.10 mg/dL   Calcium 8.7  8.4 - 10.5 mg/dL   Total Protein 6.3  6.0 - 8.3 g/dL   Albumin 2.4 (*) 3.5 - 5.2 g/dL   AST 33  0 - 37 U/L   ALT 66 (*) 0 - 35 U/L   Alkaline Phosphatase 171 (*) 39 - 117 U/L   Total Bilirubin 0.4  0.3 - 1.2 mg/dL   GFR calc non Af Amer 90 (*) >90 mL/min   GFR calc Af Amer >90  >90 mL/min   Comment: (NOTE)     The eGFR has been calculated using the CKD EPI equation.     This calculation has not been validated in all clinical situations.     eGFR's persistently <90 mL/min signify possible Chronic Kidney     Disease.  MAGNESIUM     Status: None   Collection Time    05/21/14  5:46 AM      Result Value Ref Range   Magnesium 2.2  1.5 - 2.5 mg/dL  CBC WITH DIFFERENTIAL     Status: Abnormal   Collection Time    05/21/14  8:12 AM      Result Value Ref Range   WBC 16.0 (*) 4.0 - 10.5 K/uL   RBC 2.85 (*) 3.87 - 5.11 MIL/uL   Hemoglobin 8.7 (*) 12.0 - 15.0 g/dL     Comment: DELTA CHECK NOTED     POST TRANSFUSION SPECIMEN   HCT 25.6 (*) 36.0 - 46.0 %   MCV 89.8  78.0 - 100.0 fL   MCH 30.5  26.0 - 34.0 pg   MCHC 34.0  30.0 - 36.0 g/dL   RDW 20.0 (*) 11.5 - 15.5 %   Platelets 180  150 - 400 K/uL   Neutrophils Relative % 83 (*) 43 - 77 %   Neutro Abs 13.3 (*) 1.7 - 7.7 K/uL   Lymphocytes Relative 9 (*) 12 - 46 %   Lymphs Abs 1.5  0.7 - 4.0 K/uL   Monocytes Relative 7  3 - 12 %   Monocytes Absolute 1.2 (*) 0.1 - 1.0 K/uL   Eosinophils Relative 0  0 - 5 %   Eosinophils Absolute 0.0  0.0 - 0.7 K/uL   Basophils Relative 0  0 - 1 %   Basophils Absolute 0.0  0.0 - 0.1 K/uL    No results found.  Review of Systems  Unable to perform ROS: acuity of condition   Blood pressure 155/84, pulse 74, temperature 99.3 F (37.4 C), temperature source Oral, resp. rate 27, height 5' 5"  (1.651 m), weight 197 lb 8.5 oz (89.6 kg), last menstrual period 02/19/2014, SpO2 100.00%. Physical Exam  Constitutional: She appears well-developed and well-nourished.  HENT:  Head: Normocephalic.  Neck: Neck supple. No tracheal deviation present.  Cardiovascular: Normal rate and normal heart sounds.   Respiratory: Effort normal. No stridor. No respiratory distress. She has no wheezes.    Port-A-Cath site right upper chest with localized tenderness and no drainage  GI: Soft. She exhibits no distension. There is no tenderness.  Musculoskeletal: Normal range of motion.  Neurological: She is alert.  Psychiatric: She  has a normal mood and affect.    Assessment/Plan: Infected Port-A-Cath with bacteremia. She appears ill. Will proceed emergently to the operating room for Port-A-Cath removal. Procedure, risks, and benefits were discussed in detail with the patient. She is agreeable.  Ashar Lewinski E 05/21/2014, 6:28 PM

## 2014-05-21 NOTE — Progress Notes (Signed)
   Will provide formal recs shortly. Preliminary recs are listed below     [place] [facility] Staphylococcus aureus bacteremia   Staphylococcus aureus bacteremia (SAB) is associated with a high rate of complications and mortality.  Specific aspects of clinical management are critical to optimizing the outcome of patients with SAB.  Therefore, the [facility] Management Team [facility]) has initiated an intervention aimed at improving the management of SAB at [facility].  To do so, Infectious Diseases physicians are providing an evidence-based consult for the management of all patients with SAB.     Yes No Comments  Perform follow-up blood cultures (even if the patient is afebrile) to ensure clearance of bacteremia [x]  []  Will order blood cultures today  Remove vascular catheter and obtain follow-up blood cultures after the removal of the catheter []  [x]  Recommend to remove port. exceedling difficult to cure staph aureus bacteermia with retained port  Perform echocardiography to evaluate for endocarditis (transthoracic ECHO is 40-50% sensitive, TEE is > 90% sensitive) []  [x]  Please get TEE to rule out endocarditis       Ensure source control []  []  Have all abscesses been drained effectively? Have deep seeded infections (septic joints or osteomyelitis) had appropriate surgical debridement?  Investigate for "metastatic" sites of infection []  []  Does the patient have ANY symptom or physical exam finding that would suggest a deeper infection (back or neck pain that may be suggestive of vertebral osteomyelitis or epidural abscess, muscle pain that could be a symptom of pyomyositis)?  Keep in mind that for deep seeded infections MRI imaging with contrast is preferred rather than other often insensitive tests such as plain x-rays, especially early in a patient's presentation.  Change antibiotic therapy to ____nafcillin plus vancomycin. D/c cefepime______ []  []  Beta-lactam  antibiotics are preferred for MSSA due to higher cure rates.   If on Vancomycin, goal trough should be 15 - 20 mcg/mL  Estimated duration of IV antibiotic therapy:  6 wk until we can exclude endocarditis []  []  Consult case management for probably prolonged outpatient IV antibiotic therapy

## 2014-05-21 NOTE — Progress Notes (Signed)
ANTIBIOTIC CONSULT NOTE - INITIAL  Pharmacy Consult for vancomycin Indication: Staph bacteremia  No Known Allergies  Patient Measurements: Height: _0  (165.1 cm) Weight: 197 lb 8.5 oz (89.6 kg) IBW/kg (Calculated) : 57   Vital Signs: Temp: 98.9 F (37.2 C) (06/19 2040) Temp src: Oral (06/19 1613) BP: 134/79 mmHg (06/19 2036) Pulse Rate: 87 (06/19 2036) Intake/Output from previous day: 06/18 0701 - 06/19 0700 In: 4040.2 [P.O.:1000; I.V.:1611.7; Blood:678.5; IV [id number]:793] Out: 1000 [Urine:1000] Intake/Output from this shift: Total I/O In: 700 [I.V.:700] Out: -   Labs:  Recent Labs  05/19/14 1858 05/20/14 0359 05/20/14 0947 05/21/14 0546 05/21/14 0812  WBC 28.3* 22.6*  --   --  16.0*  HGB 8.8* 6.6* 6.8*  --  8.7*  PLT 257 188  --   --  180  CREATININE 1.40* 1.11*  --  0.81  --    Estimated Creatinine Clearance: 102 ml/min (by C-G formula based on Cr of 0.81).  Recent Labs  05/21/14 2056  [place] 12.5     Microbiology: Recent Results (from the past 720 hour(s))  CULTURE, BLOOD (ROUTINE X 2)     Status: None   Collection Time    05/19/14  6:55 PM      Result Value Ref Range Status   Specimen Description BLOOD ARM LEFT   Final   Special Requests BOTTLES DRAWN AEROBIC AND ANAEROBIC 5CC   Final   Culture  Setup Time     Final   Value: 05/20/2014 01:19     Performed at [facility]   Culture     Final   Value: STAPHYLOCOCCUS AUREUS     Note: RIFAMPIN AND GENTAMICIN SHOULD NOT BE USED AS SINGLE DRUGS FOR TREATMENT OF STAPH INFECTIONS.     Note: Gram Stain Report Called to,Read Back By and Verified With: DARA A_1 :15PM ON 05/20/14 BY DANTS     Performed at [facility]   Report Status PENDING   Incomplete  CULTURE, BLOOD (ROUTINE X 2)     Status: None   Collection Time    05/19/14  7:27 PM      Result Value Ref Range Status   Specimen Description BLOOD ARM LEFT   Final   Special Requests BOTTLES DRAWN AEROBIC AND ANAEROBIC 10CC    Final   Culture  Setup Time     Final   Value: 05/20/2014 01:19     Performed at [facility]   Culture     Final   Value: STAPHYLOCOCCUS AUREUS     Note: Gram Stain Report Called to,Read Back By and Verified With: DARA A_2 :15PM ON 05/20/14 BY DANTS     Performed at [facility]   Report Status PENDING   Incomplete  URINE CULTURE     Status: None   Collection Time    05/19/14  8:01 PM      Result Value Ref Range Status   Specimen Description URINE, RANDOM   Final   Special Requests NONE   Final   Culture  Setup Time     Final   Value: 05/19/2014 20:34     Performed at [place]     Final   Value: NO GROWTH     Performed at [facility]   Culture     Final   Value: NO GROWTH     Performed at [facility]   Report Status 05/20/2014 FINAL   Final  MRSA  PCR SCREENING     Status: None   Collection Time    05/19/14  9:51 PM      Result Value Ref Range Status   MRSA by PCR NEGATIVE  NEGATIVE Final   Comment:            The GeneXpert MRSA Assay (FDA     approved for NASAL specimens     only), is one component of a     comprehensive MRSA colonization     surveillance program. It is not     intended to diagnose MRSA     infection nor to guide or     monitor treatment for     MRSA infections.    Medical History: Past Medical History  Diagnosis Date  . Endometriosis   . Hypertension   . Rash     fine rash on abd  . Anemia   . Wears glasses   . 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 Iron deficiency anemia, unspecified 02/25/2014  . BRCA1 positive     c.190T>G (p.Cys64Gly) @ [facility]     Assessment: 62 YOF currently undergoing chemotherapy for breast cancer, seen in the ED with cough and cold symptoms for several days. Blood cx are positive for staph bacteremia. She currently on both vanc and nafcillin right now since sens are not back yet. Porta cath was removed this PM. Trough came back at 12.7 this PM. Sens should  be back tomorrow.   Goal of Therapy:  Vancomycin trough level 15-20 mcg/ml  Plan:   Increase vanc to 1.25g IV q8 F/u with sens before the next trough

## 2014-05-21 NOTE — Consult Note (Signed)
  [facility]  Total days of antibiotics 3        Day 3 cefepime        Day 3 vanco               Reason for Consult: SAB    Referring Physician: woods  Principal Problem:   Sepsis Active Problems:   Breast cancer of upper-outer quadrant of left female breast   HTN (hypertension)   Iron deficiency anemia, unspecified   HCAP (healthcare-associated pneumonia)   Sinus tachycardia   AKI (acute kidney injury)   Acute blood loss anemia   Staphylococcal sepsis    HPI: Tamara [name] is a 43 y.o. female with history of BRCA-1, ER-/PR-/Her2+ Breast Cancer diagnosed 02/2104 currently receiving neoadjuvent chemotherapy of Taxotere carboplatinum Herceptin and perjeta every 3 weeks for a total of 6 cycles.Cycle 1 began 02-25-14.  She was due for C5 on 6/18 but had to be placed on hold due to hospitalization.  she was admitted on 05/19/14 with fever, marked leukocytosis of 28K left shift, tenderness about her port site. She states that she had fevers and chills for the past 2 days and coughing and chest discomfort x 1 week. She has also been having chest discomfort x 1 week and had a CTA of the Chest performed as an outpatient which was negative for a Pulmonary embolism. She had a temperature to 102-101 for the first 2 days of admission. She was empirically started on vancomycin adn cefepime and infectious work up thus far identified staph aureus bacteremia but sensitivities are pending. She states that she had taken antibiotics twice for her Port-A-Cath having a superficial infection back in late may due to extensive bruising.  She also reports having diarrhea as well as back pain.    Past Medical History  Diagnosis Date  . Endometriosis   . Hypertension   . Rash     fine rash on abd  . Anemia   . Wears glasses   . 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 Iron deficiency anemia, unspecified 02/25/2014  . BRCA1 positive     c.190T>G (p.Cys64Gly) @ [facility]      Allergies: No Known Allergies   MEDICATIONS: . carvedilol  3.125 mg Oral BID WC  . ceFEPime (MAXIPIME) IV  1 g Intravenous 3 times per day  . enoxaparin (LOVENOX) injection  40 mg Subcutaneous QHS  . ferrous sulfate  325 mg Oral TID WC  . nafcillin IV  2 g Intravenous 4 times per day  . pantoprazole  40 mg Oral Daily  . vancomycin  1,000 mg Intravenous Q8H  . vitamin C  500 mg Oral TID    History  Substance Use Topics  . Smoking status: Former Smoker -- 0.25 packs/day for 15 years    Quit date: 02/18/2009  . Smokeless tobacco: Former User  . Alcohol Use: Yes     Comment: occasional    Family History  Problem Relation Age of Onset  . Breast cancer Mother 56    currently 59  . Diabetes Father   . Pancreatic cancer Father 55  . Breast cancer Paternal Aunt 48    currently 60; BRCA1 positive  . Stroke Maternal Grandfather   . Cancer Paternal Aunt     unk. primary; deceased 70s  . Breast cancer Cousin     daughter of unaffected paternal aunt; dx in her 40s     Review of Systems  Constitutional: positive   for fever, chills, diaphoresis, activity change, appetite change, fatigue and unexpected weight change.  HENT: Negative for congestion, sore throat, rhinorrhea, sneezing, trouble swallowing and sinus pressure.  Eyes: Negative for photophobia and visual disturbance.  Respiratory: Negative for cough, chest tightness, shortness of breath, wheezing and stridor.  Cardiovascular: Negative for chest pain, palpitations and leg swelling.  Gastrointestinal: positive for diarrhea. for nausea, vomiting, abdominal pain, diarrhea, constipation, blood in stool, abdominal distention and anal bleeding.  Genitourinary: Negative for dysuria, hematuria, flank pain and difficulty urinating.  Musculoskeletal: Negative for back pain. myalgias,joint swelling, arthralgias and gait problem.  Skin: Negative for color change, pallor, rash and wound.  Neurological: Negative for dizziness, tremors,  weakness and light-headedness.  Hematological: Negative for adenopathy. Does not bruise/bleed easily.  Psychiatric/Behavioral: Negative for behavioral problems, confusion, sleep disturbance, dysphoric mood, decreased concentration and agitation.    OBJECTIVE: Temp:  [97.9 F (36.6 C)-101.7 F (38.7 C)] 98.3 F (36.8 C) (06/19 1200) Pulse Rate:  [74-102] 74 (06/19 1200) Resp:  [12-39] 27 (06/19 1200) BP: (86-155)/(41-103) 155/84 mmHg (06/19 1200) SpO2:  [96 %-100 %] 100 % (06/19 1200)  Constitutional:  oriented to person, place, and time. appears well-developed and well-nourished. Looks uncomfortable. HENT:  Mouth/Throat: Oropharynx is clear and moist. No oropharyngeal exudate.  Cardiovascular: Normal rate, regular rhythm and normal heart sounds. Exam reveals no gallop and no friction rub.  No murmur heard.  Pulmonary/Chest: Effort normal and breath sounds normal. No respiratory distress.  has no wheezes.  Abdominal: Soft. Bowel sounds are normal.  exhibits no distension. There is no tenderness.  Lymphadenopathy: no cervical adenopathy.  Neurological: alert and oriented to person, place, and time.  Skin: Skin is warm and dry. No rash noted. No erythema. No stigmata for endocarditis Psychiatric: a normal mood and affect.  behavior is normal.   LABS: Results for orders placed during the hospital encounter of 05/19/14 (from the past 48 hour(s))  CULTURE, BLOOD (ROUTINE X 2)     Status: None   Collection Time    05/19/14  6:55 PM      Result Value Ref Range   Specimen Description BLOOD ARM LEFT     Special Requests BOTTLES DRAWN AEROBIC AND ANAEROBIC 5CC     Culture  Setup Time       Value: 05/20/2014 01:19     Performed at [facility]   Culture       Value: STAPHYLOCOCCUS AUREUS     Note: RIFAMPIN AND GENTAMICIN SHOULD NOT BE USED AS SINGLE DRUGS FOR TREATMENT OF STAPH INFECTIONS.     Note: Gram Stain Report Called to,Read Back By and Verified With: DARA A@1:15PM ON  05/20/14 BY DANTS     Performed at [facility]   Report Status PENDING    CBC WITH DIFFERENTIAL     Status: Abnormal   Collection Time    05/19/14  6:58 PM      Result Value Ref Range   WBC 28.3 (*) 4.0 - 10.5 K/uL   RBC 2.86 (*) 3.87 - 5.11 MIL/uL   Hemoglobin 8.8 (*) 12.0 - 15.0 g/dL   HCT 26.4 (*) 36.0 - 46.0 %   MCV 92.3  78.0 - 100.0 fL   MCH 30.8  26.0 - 34.0 pg   MCHC 33.3  30.0 - 36.0 g/dL   RDW 21.0 (*) 11.5 - 15.5 %   Platelets 257  150 - 400 K/uL   Neutrophils Relative % 88 (*) 43 - 77 %     Lymphocytes Relative 5 (*) 12 - 46 %   Monocytes Relative 7  3 - 12 %   Eosinophils Relative 0  0 - 5 %   Basophils Relative 0  0 - 1 %   Neutro Abs 24.9 (*) 1.7 - 7.7 K/uL   Lymphs Abs 1.4  0.7 - 4.0 K/uL   Monocytes Absolute 2.0 (*) 0.1 - 1.0 K/uL   Eosinophils Absolute 0.0  0.0 - 0.7 K/uL   Basophils Absolute 0.0  0.0 - 0.1 K/uL   RBC Morphology POLYCHROMASIA PRESENT     WBC Morphology ATYPICAL LYMPHOCYTES     Comment: TOXIC GRANULATION   Smear Review LARGE PLATELETS PRESENT    LIPASE, BLOOD     Status: None   Collection Time    05/19/14  6:58 PM      Result Value Ref Range   Lipase 39  11 - 59 U/L  HEPATIC FUNCTION PANEL     Status: Abnormal   Collection Time    05/19/14  6:58 PM      Result Value Ref Range   Total Protein 7.7  6.0 - 8.3 g/dL   Albumin 3.2 (*) 3.5 - 5.2 g/dL   AST 30  0 - 37 U/L   ALT 84 (*) 0 - 35 U/L   Alkaline Phosphatase 166 (*) 39 - 117 U/L   Total Bilirubin 0.8  0.3 - 1.2 mg/dL   Bilirubin, Direct 0.3  0.0 - 0.3 mg/dL   Indirect Bilirubin 0.5  0.3 - 0.9 mg/dL  BASIC METABOLIC PANEL     Status: Abnormal   Collection Time    05/19/14  6:58 PM      Result Value Ref Range   Sodium 138  137 - 147 mEq/L   Potassium 4.0  3.7 - 5.3 mEq/L   Chloride 95 (*) 96 - 112 mEq/L   CO2 24  19 - 32 mEq/L   Glucose, Bld 112 (*) 70 - 99 mg/dL   BUN 14  6 - 23 mg/dL   Creatinine, Ser 1.40 (*) 0.50 - 1.10 mg/dL   Calcium 9.7  8.4 - 10.5 mg/dL   GFR  calc non Af Amer 46 (*) >90 mL/min   GFR calc Af Amer 54 (*) >90 mL/min   Comment: (NOTE)     The eGFR has been calculated using the CKD EPI equation.     This calculation has not been validated in all clinical situations.     eGFR's persistently <90 mL/min signify possible Chronic Kidney     Disease.  CULTURE, BLOOD (ROUTINE X 2)     Status: None   Collection Time    05/19/14  7:27 PM      Result Value Ref Range   Specimen Description BLOOD ARM LEFT     Special Requests BOTTLES DRAWN AEROBIC AND ANAEROBIC 10CC     Culture  Setup Time       Value: 05/20/2014 01:19     Performed at [facility]   Culture       Value: STAPHYLOCOCCUS AUREUS     Note: Gram Stain Report Called to,Read Back By and Verified With: DARA A@1:15PM ON 05/20/14 BY DANTS     Performed at [facility]   Report Status PENDING    I-STAT CG4 LACTIC ACID, ED     Status: None   Collection Time    05/19/14  7:37 PM      Result Value Ref Range   Lactic Acid,   Venous 1.68  0.5 - 2.2 mmol/L  URINALYSIS, ROUTINE W REFLEX MICROSCOPIC     Status: Abnormal   Collection Time    05/19/14  8:01 PM      Result Value Ref Range   Color, Urine AMBER (*) YELLOW   Comment: BIOCHEMICALS MAY BE AFFECTED BY COLOR   APPearance HAZY (*) CLEAR   Specific Gravity, Urine 1.022  1.005 - 1.030   pH 5.5  5.0 - 8.0   Glucose, UA NEGATIVE  NEGATIVE mg/dL   Hgb urine dipstick SMALL (*) NEGATIVE   Bilirubin Urine SMALL (*) NEGATIVE   Ketones, ur 15 (*) NEGATIVE mg/dL   Protein, ur 100 (*) NEGATIVE mg/dL   Urobilinogen, UA 1.0  0.0 - 1.0 mg/dL   Nitrite NEGATIVE  NEGATIVE   Leukocytes, UA MODERATE (*) NEGATIVE  URINE CULTURE     Status: None   Collection Time    05/19/14  8:01 PM      Result Value Ref Range   Specimen Description URINE, RANDOM     Special Requests NONE     Culture  Setup Time       Value: 05/19/2014 20:34     Performed at [facility]   Colony Count       Value: NO GROWTH     Performed at  [facility]   Culture       Value: NO GROWTH     Performed at [facility]   Report Status 05/20/2014 FINAL    PREGNANCY, URINE     Status: None   Collection Time    05/19/14  8:01 PM      Result Value Ref Range   Preg Test, Ur NEGATIVE  NEGATIVE   Comment:            THE SENSITIVITY OF THIS     METHODOLOGY IS >20 mIU/mL.  URINE MICROSCOPIC-ADD ON     Status: Abnormal   Collection Time    05/19/14  8:01 PM      Result Value Ref Range   Squamous Epithelial / LPF FEW (*) RARE   WBC, UA TOO NUMEROUS TO COUNT  <3 WBC/hpf   RBC / HPF 0-2  <3 RBC/hpf   Bacteria, UA MANY (*) RARE   Casts GRANULAR CAST (*) NEGATIVE   Urine-Other MUCOUS PRESENT    MRSA PCR SCREENING     Status: None   Collection Time    05/19/14  9:51 PM      Result Value Ref Range   MRSA by PCR NEGATIVE  NEGATIVE   Comment:            The GeneXpert MRSA Assay (FDA     approved for NASAL specimens     only), is one component of a     comprehensive MRSA colonization     surveillance program. It is not     intended to diagnose MRSA     infection nor to guide or     monitor treatment for     MRSA infections.  BASIC METABOLIC PANEL     Status: Abnormal   Collection Time    05/20/14  3:59 AM      Result Value Ref Range   Sodium 137  137 - 147 mEq/L   Potassium 3.6 (*) 3.7 - 5.3 mEq/L   Chloride 103  96 - 112 mEq/L   Comment: DELTA CHECK NOTED   CO2 17 (*) 19 - 32 mEq/L   Glucose,   Bld 144 (*) 70 - 99 mg/dL   BUN 12  6 - 23 mg/dL   Creatinine, Ser 1.11 (*) 0.50 - 1.10 mg/dL   Calcium 8.0 (*) 8.4 - 10.5 mg/dL   GFR calc non Af Amer 61 (*) >90 mL/min   GFR calc Af Amer 71 (*) >90 mL/min   Comment: (NOTE)     The eGFR has been calculated using the CKD EPI equation.     This calculation has not been validated in all clinical situations.     eGFR's persistently <90 mL/min signify possible Chronic Kidney     Disease.  CBC     Status: Abnormal   Collection Time    05/20/14  3:59 AM      Result  Value Ref Range   WBC 22.6 (*) 4.0 - 10.5 K/uL   RBC 2.17 (*) 3.87 - 5.11 MIL/uL   Hemoglobin 6.6 (*) 12.0 - 15.0 g/dL   Comment: REPEATED TO VERIFY     CRITICAL RESULT CALLED TO, READ BACK BY AND VERIFIED WITH:     M.PIEL RN 0421 05/20/14 E.GADDY   HCT 20.1 (*) 36.0 - 46.0 %   MCV 92.6  78.0 - 100.0 fL   MCH 30.4  26.0 - 34.0 pg   MCHC 32.8  30.0 - 36.0 g/dL   RDW 20.7 (*) 11.5 - 15.5 %   Platelets 188  150 - 400 K/uL   Comment: REPEATED TO VERIFY     DELTA CHECK NOTED  PREPARE RBC (CROSSMATCH)     Status: None   Collection Time    05/20/14  7:05 AM      Result Value Ref Range   Order Confirmation ORDER PROCESSED BY BLOOD BANK    TYPE AND SCREEN     Status: None   Collection Time    05/20/14  7:05 AM      Result Value Ref Range   ABO/RH(D) O NEG     Antibody Screen NEG     Sample Expiration 05/23/2014     Unit Number [id number]     Blood Component Type RBC LR PHER1     Unit division 00     Status of Unit ISSUED,FINAL     Transfusion Status OK TO TRANSFUSE     Crossmatch Result Compatible     Unit Number [id number]     Blood Component Type RED CELLS,LR     Unit division 00     Status of Unit ISSUED,FINAL     Transfusion Status OK TO TRANSFUSE     Crossmatch Result Compatible    ABO/RH     Status: None   Collection Time    05/20/14  7:05 AM      Result Value Ref Range   ABO/RH(D) O NEG    HEMOGLOBIN AND HEMATOCRIT, BLOOD     Status: Abnormal   Collection Time    05/20/14  9:47 AM      Result Value Ref Range   Hemoglobin 6.8 (*) 12.0 - 15.0 g/dL   Comment: CRITICAL VALUE NOTED.  VALUE IS CONSISTENT WITH PREVIOUSLY REPORTED AND CALLED VALUE.   HCT 20.2 (*) 36.0 - 46.0 %  PREPARE RBC (CROSSMATCH)     Status: None   Collection Time    05/20/14 10:00 AM      Result Value Ref Range   Order Confirmation ORDER PROCESSED BY BLOOD BANK    VITAMIN B12     Status: Abnormal   Collection Time  05/20/14  7:09 PM      Result Value Ref Range   Vitamin B-12 >2000 (*)  211 - 911 pg/mL   Comment: Performed at [facility]  FOLATE     Status: None   Collection Time    05/20/14  7:09 PM      Result Value Ref Range   Folate 13.6     Comment: (NOTE)     Reference Ranges            Deficient:       0.4 - 3.3 ng/mL            Indeterminate:   3.4 - 5.4 ng/mL            Normal:              > 5.4 ng/mL     Performed at [facility]  IRON AND TIBC     Status: Abnormal   Collection Time    05/20/14  7:09 PM      Result Value Ref Range   Iron 54  42 - 135 ug/dL   TIBC 178 (*) 250 - 470 ug/dL   Saturation Ratios 30  20 - 55 %   UIBC 124 (*) 125 - 400 ug/dL   Comment: Performed at [facility]  FERRITIN     Status: Abnormal   Collection Time    05/20/14  7:09 PM      Result Value Ref Range   Ferritin 1418 (*) 10 - 291 ng/mL   Comment: Performed at [facility]  RETICULOCYTES     Status: Abnormal   Collection Time    05/20/14  7:09 PM      Result Value Ref Range   Retic Ct Pct 0.7  0.4 - 3.1 %   RBC. 2.88 (*) 3.87 - 5.11 MIL/uL   Retic Count, Manual 20.2  19.0 - 186.0 K/uL  HAPTOGLOBIN     Status: Abnormal   Collection Time    05/20/14  7:09 PM      Result Value Ref Range   Haptoglobin 429 (*) 45 - 215 mg/dL   Comment: Result confirmed by automatic dilution.     Performed at [facility]  LACTATE DEHYDROGENASE     Status: Abnormal   Collection Time    05/20/14  7:09 PM      Result Value Ref Range   LDH 384 (*) 94 - 250 U/L  COMPREHENSIVE METABOLIC PANEL     Status: Abnormal   Collection Time    05/21/14  5:46 AM      Result Value Ref Range   Sodium 139  137 - 147 mEq/L   Potassium 3.6 (*) 3.7 - 5.3 mEq/L   Chloride 104  96 - 112 mEq/L   CO2 19  19 - 32 mEq/L   Glucose, Bld 102 (*) 70 - 99 mg/dL   BUN 7  6 - 23 mg/dL   Creatinine, Ser 0.81  0.50 - 1.10 mg/dL   Calcium 8.7  8.4 - 10.5 mg/dL   Total Protein 6.3  6.0 - 8.3 g/dL   Albumin 2.4 (*) 3.5 - 5.2 g/dL   AST 33  0 - 37 U/L   ALT 66 (*) 0 -  35 U/L   Alkaline Phosphatase 171 (*) 39 - 117 U/L   Total Bilirubin 0.4  0.3 - 1.2 mg/dL   GFR calc non Af Amer 90 (*) >90 mL/min   GFR   calc Af Amer >90  >90 mL/min   Comment: (NOTE)     The eGFR has been calculated using the CKD EPI equation.     This calculation has not been validated in all clinical situations.     eGFR's persistently <90 mL/min signify possible Chronic Kidney     Disease.  MAGNESIUM     Status: None   Collection Time    05/21/14  5:46 AM      Result Value Ref Range   Magnesium 2.2  1.5 - 2.5 mg/dL  CBC WITH DIFFERENTIAL     Status: Abnormal   Collection Time    05/21/14  8:12 AM      Result Value Ref Range   WBC 16.0 (*) 4.0 - 10.5 K/uL   RBC 2.85 (*) 3.87 - 5.11 MIL/uL   Hemoglobin 8.7 (*) 12.0 - 15.0 g/dL   Comment: DELTA CHECK NOTED     POST TRANSFUSION SPECIMEN   HCT 25.6 (*) 36.0 - 46.0 %   MCV 89.8  78.0 - 100.0 fL   MCH 30.5  26.0 - 34.0 pg   MCHC 34.0  30.0 - 36.0 g/dL   RDW 20.0 (*) 11.5 - 15.5 %   Platelets 180  150 - 400 K/uL   Neutrophils Relative % 83 (*) 43 - 77 %   Neutro Abs 13.3 (*) 1.7 - 7.7 K/uL   Lymphocytes Relative 9 (*) 12 - 46 %   Lymphs Abs 1.5  0.7 - 4.0 K/uL   Monocytes Relative 7  3 - 12 %   Monocytes Absolute 1.2 (*) 0.1 - 1.0 K/uL   Eosinophils Relative 0  0 - 5 %   Eosinophils Absolute 0.0  0.0 - 0.7 K/uL   Basophils Relative 0  0 - 1 %   Basophils Absolute 0.0  0.0 - 0.1 K/uL    MICRO: 6/17 blood cx staph aureus 6/17 urine cx ngtd IMAGING: Dg Chest 2 View  05/19/2014   CLINICAL DATA:  Chest pain.  Cough.  EXAM: CHEST  2 VIEW  COMPARISON:  CT chest 05/11/2014  FINDINGS: Power port catheter noted with tip projected over right atrium. Minimal subsegmental atelectasis/infiltrate right lung base as noted on recent CT of 05/11/2014. Stable cardiomegaly with normal pulmonary vascularity. No pleural effusion or pneumothorax. No acute bony abnormality .  IMPRESSION: Minimal subsegmental atelectasis and/or infiltrate right  lung bases as noted on recent CT of 05/11/2014. No other acute or focal abnormalities.   Electronically Signed   By: Marcello Moores  Register   On: 05/19/2014 16:17    Assessment/Plan:  43yo F with breast cancer undergoing neoadjuvent chemo who presents is found to have staph aureus bacteremia and uses subcut port for chemotherapy. Previously treated with oral antibiotics for superficial infection  - staph aureus sensitivity still pending, recommned to treat with nafcillin and vancomycin for the time being until sensitivities return. Can d/c cefepime - recommend to repeat culture - recommend to have port a cath removed since it is exceedingly difficult to cure SAB without nidus removed - would recommend to get TEE to see if evidence of endocarditis - please repeat blood cx today or tomorrow to document clearance of bacteremia - length of antibiotic course will depend on TEE course, complicated SAB tx for 4-6 wk - would recommend getting mri of lumbar thoracic spine to look for abscess due to bacteremia since she is complaining of new onset back pain - would check for cdifficile since she has had numerous stools  and recent antibiotic use  Cynthia B. Snider MD MPH [facility] [phone]    

## 2014-05-21 NOTE — Progress Notes (Signed)
*  Preliminary Results* Right upper extremity venous duplex completed. Right upper extremity is positive for deep vein thrombosis involving the right internal jugular and subclavian veins.  Preliminary results discussed with Junie Panning, RN.  05/21/2014 3:58 PM  Maudry Mayhew, RVT, RDCS, RDMS

## 2014-05-22 ENCOUNTER — Inpatient Hospital Stay (HOSPITAL_COMMUNITY): Payer: Medicaid Other

## 2014-05-22 DIAGNOSIS — R509 Fever, unspecified: Secondary | ICD-10-CM

## 2014-05-22 DIAGNOSIS — I82409 Acute embolism and thrombosis of unspecified deep veins of unspecified lower extremity: Secondary | ICD-10-CM | POA: Diagnosis present

## 2014-05-22 DIAGNOSIS — M549 Dorsalgia, unspecified: Secondary | ICD-10-CM

## 2014-05-22 DIAGNOSIS — E46 Unspecified protein-calorie malnutrition: Secondary | ICD-10-CM

## 2014-05-22 DIAGNOSIS — R197 Diarrhea, unspecified: Secondary | ICD-10-CM

## 2014-05-22 LAB — CBC WITH DIFFERENTIAL/PLATELET
BASOS ABS: 0 10*3/uL (ref 0.0–0.1)
BASOS PCT: 0 % (ref 0–1)
Eosinophils Absolute: 0 10*3/uL (ref 0.0–0.7)
Eosinophils Relative: 0 % (ref 0–5)
HEMATOCRIT: 24.4 % — AB (ref 36.0–46.0)
Hemoglobin: 8.1 g/dL — ABNORMAL LOW (ref 12.0–15.0)
Lymphocytes Relative: 16 % (ref 12–46)
Lymphs Abs: 2.2 10*3/uL (ref 0.7–4.0)
MCH: 30.6 pg (ref 26.0–34.0)
MCHC: 33.2 g/dL (ref 30.0–36.0)
MCV: 92.1 fL (ref 78.0–100.0)
Monocytes Absolute: 1.3 10*3/uL — ABNORMAL HIGH (ref 0.1–1.0)
Monocytes Relative: 9 % (ref 3–12)
NEUTROS ABS: 10.4 10*3/uL — AB (ref 1.7–7.7)
Neutrophils Relative %: 75 % (ref 43–77)
Platelets: 197 10*3/uL (ref 150–400)
RBC: 2.65 MIL/uL — ABNORMAL LOW (ref 3.87–5.11)
RDW: 19.9 % — AB (ref 11.5–15.5)
WBC: 13.9 10*3/uL — ABNORMAL HIGH (ref 4.0–10.5)

## 2014-05-22 LAB — COMPREHENSIVE METABOLIC PANEL
ALK PHOS: 114 U/L (ref 39–117)
ALT: 45 U/L — AB (ref 0–35)
AST: 16 U/L (ref 0–37)
Albumin: 2.1 g/dL — ABNORMAL LOW (ref 3.5–5.2)
BILIRUBIN TOTAL: 0.6 mg/dL (ref 0.3–1.2)
BUN: 5 mg/dL — ABNORMAL LOW (ref 6–23)
CHLORIDE: 103 meq/L (ref 96–112)
CO2: 20 meq/L (ref 19–32)
Calcium: 8.4 mg/dL (ref 8.4–10.5)
Creatinine, Ser: 0.89 mg/dL (ref 0.50–1.10)
GFR calc non Af Amer: 80 mL/min — ABNORMAL LOW (ref >90)
GLUCOSE: 102 mg/dL — AB (ref 70–99)
POTASSIUM: 3.6 meq/L — AB (ref 3.7–5.3)
SODIUM: 140 meq/L (ref 137–147)
TOTAL PROTEIN: 5.9 g/dL — AB (ref 6.0–8.3)

## 2014-05-22 LAB — MAGNESIUM: Magnesium: 1.9 mg/dL (ref 1.5–2.5)

## 2014-05-22 LAB — CULTURE, BLOOD (ROUTINE X 2)

## 2014-05-22 LAB — CLOSTRIDIUM DIFFICILE BY PCR: Toxigenic C. Difficile by PCR: NEGATIVE

## 2014-05-22 MED ORDER — ONDANSETRON HCL 4 MG PO TABS
8.0000 mg | ORAL_TABLET | Freq: Three times a day (TID) | ORAL | Status: DC | PRN
  Administered 2014-05-23 – 2014-05-27 (×2): 8 mg via ORAL
  Filled 2014-05-22 (×3): qty 2

## 2014-05-22 MED ORDER — HEPARIN (PORCINE) IN NACL 100-0.45 UNIT/ML-% IJ SOLN
1300.0000 [IU]/h | INTRAMUSCULAR | Status: DC
  Administered 2014-05-22: 1300 [IU]/h via INTRAVENOUS
  Filled 2014-05-22 (×2): qty 250

## 2014-05-22 MED ORDER — HEPARIN (PORCINE) IN NACL 100-0.45 UNIT/ML-% IJ SOLN
2200.0000 [IU]/h | INTRAMUSCULAR | Status: DC
  Administered 2014-05-22: 1300 [IU]/h via INTRAVENOUS
  Administered 2014-05-23: 1600 [IU]/h via INTRAVENOUS
  Administered 2014-05-24 (×3): 2400 [IU]/h via INTRAVENOUS
  Administered 2014-05-26: 2100 [IU]/h via INTRAVENOUS
  Administered 2014-05-26: 2200 [IU]/h via INTRAVENOUS
  Filled 2014-05-22 (×12): qty 250

## 2014-05-22 MED ORDER — ENSURE COMPLETE PO LIQD
237.0000 mL | Freq: Two times a day (BID) | ORAL | Status: DC
  Administered 2014-05-22 – 2014-05-25 (×6): 237 mL via ORAL

## 2014-05-22 MED ORDER — HEPARIN BOLUS VIA INFUSION
3900.0000 [IU] | Freq: Once | INTRAVENOUS | Status: AC
  Administered 2014-05-22: 3900 [IU] via INTRAVENOUS
  Filled 2014-05-22: qty 3900

## 2014-05-22 MED ORDER — GADOBENATE DIMEGLUMINE 529 MG/ML IV SOLN
20.0000 mL | Freq: Once | INTRAVENOUS | Status: AC | PRN
  Administered 2014-05-22: 20 mL via INTRAVENOUS

## 2014-05-22 MED ORDER — ONDANSETRON 8 MG/NS 50 ML IVPB
8.0000 mg | Freq: Three times a day (TID) | INTRAVENOUS | Status: DC | PRN
  Administered 2014-05-22 – 2014-05-23 (×2): 8 mg via INTRAVENOUS
  Filled 2014-05-22 (×3): qty 8

## 2014-05-22 NOTE — Progress Notes (Signed)
[facility] TEAM 1 - Stepdown/ICU TEAM Progress Note  Maeli Spacek SHF:[id number] DOB: July 26, 1971 DOA: 05/19/2014 PCP: Angelica Chessman, MD  Admit HPI / Brief Narrative: Tamara [name] is a 43 y.o. 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 She has also been having chest discomfort x 1 week and had a CTA of the Chest performed as an outpatient which was negative for a Pulmonary embolism. She had a temperature to 102 today.    HPI/Subjective: 6/20 CP has eased up, however does have pain around site of Port-A-Cath removal, (-)SOB   Assessment/Plan:  Sepsis/HCAP -Blood cultures x2 positive for Staphylococcus, awaiting sensitivities -Continue IV Vancomycin and Cefepime, and Albuterol nebs, O2 PRN, and IVFs.  -6/20 after speaking with Dr. Evlyn Clines (oncology) on 6/19,Dr. Evlyn Clines (oncology) agree that Port-A-Cath would need to be removed in order to clear sepsis. Patient S./P. Port-A-Cath removal on 6/19 -Continue vancomycin+ nafcillin per infectious disease -6/20 repeat blood culture x2 check for clearance of bacteria; once clear of bacteria will place CBC vs PICC line -Ordered transesophageal echocardiogram rule out vegetation -Ordered MRI L-spine rule out abscess in immunocompromised patient with sepsis  DVT right internal jugular and subclavian veins. -Counseled patient would start her on heparin at 2000 today without bolus -Monitor closely for bleeding/hematoma right chest wall under surgical site  Sinus Tachycardia -Resolved -Continue Coreg 3.125 mg  BID   AKI -Continue normal saline 100 ml/hr, patient's BUN/Cr. Normalized  HTN -Within AHA guidelines  continue Coreg 3.165m BID  Iron deficiency Anemia/acute blood loss anemia vs  Destruction 2dary Chemo? -6/18 transfuse 2 unit PRBC Occult Blood  -Anemia workup; appears patient has mixed anemia  -Patient's baseline hemoglobin approximately 10 last taken on 6/9 currently at 6.6. - Continue Iron Rx. 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  -6/19 S./P. removal of Port-A-Cath -  Counseled patient and husband not most likely oncology would not restart chemotherapy until infection was resolved. -Counseled that after obtaining blood cultures clear organisms would place a CBC or PICC line in order to complete antibiotics and facilitate chemotherapy.  Neck/right shoulder pain -Positive for DVT -See DVT Right IJ/subclavian  L-spine pain -MRI pending, rule out abscess   Code Status: FULL Family Communication: Husband present  Disposition Plan: Resolution sepsis;    Consultants: Dr. LEvlyn Clines(oncology), Dr. CCarlyle Basques(infectious disease) Dr. EGreer Pickerel(surgery)   Procedure/Significant Events: 6/17 CXR;Minimal subsegmental atelectasis and/or infiltrate right lung bases;noted recent CT 05/11/2014.    6/19 S./P. Port-A-Cath removal secondary to sepsis 6/19 Doppler right upper extremity;positive for deep vein thrombosis involving the right internal jugular and subclavian veins.     Culture 6/17 BLOOD ARM LEFT x 2; POSITIVE GRAM  COCCI IN CLUSTERS, MSSA  6/17 Urine negative (final)   6/19 blood Port-A-Cath NTD  6/20 blood pending   Antibiotics: Cefepime 6/18>> stopped 6/19 Vacomycin 6/18>>  Nafcillin 6/19>>  DVT prophylaxis: Lovenox   Devices Right chest wall Port-A-Cath   LINES / TUBES:  6/17 22 ga right hand 6/17 ga left antecubital    Continuous Infusions: . sodium chloride    . sodium chloride 1,000 mL (05/19/14  2104)  . sodium chloride 100 mL/hr at 05/21/14 2130     Objective: VITAL SIGNS: Temp: 98.7 F (37.1 C) (06/20 0747) Temp src: Oral (06/20 0747) BP: 139/86 mmHg (06/20 0800) Pulse Rate: 78 (06/20 1000) SPO2;96 % on RA FIO2:   Intake/Output Summary (Last 24 hours) at 05/22/14 1356 Last data filed at 05/22/14 0915  Gross per 24 hour  Intake   3750 ml  Output      0 ml  Net   3750 ml     Exam: General: A/O x 4, NAD, No acute respiratory distress, extremely tender to palpation just superior to right collar bone extending to just under right ear, and right chest wall around the incision site where the Port-A-Cath removed . Lungs: Clear to auscultation bilaterally mild bibasilar crackles  Cardiovascular: Regular rate and rhythm without murmur gallop or rub normal S1 and S2, Abdomen: Nontender, nondistended, soft, bowel sounds positive, no rebound, no ascites, no appreciable mass Extremities: No significant cyanosis, clubbing, or edema bilateral lower extremities  Data Reviewed: Basic Metabolic Panel:  Recent Labs Lab 05/19/14 1858 05/20/14 0359 05/21/14 0546 05/22/14 0322  NA 138 137 139 140  K 4.0 3.6* 3.6* 3.6*  CL 95* 103 104 103  CO2 24 17* 19 20  GLUCOSE 112* 144* 102* 102*  BUN 14 12 7  5*  CREATININE 1.40* 1.11* 0.81 0.89  CALCIUM 9.7 8.0* 8.7 8.4  MG  --   --  2.2 1.9   Liver Function Tests:  Recent Labs Lab 05/19/14 1858 05/21/14 0546 05/22/14 0322  AST 30 33 16  ALT 84* 66* 45*  ALKPHOS 166* 171* 114  BILITOT 0.8 0.4 0.6  PROT 7.7 6.3 5.9*  ALBUMIN 3.2* 2.4* 2.1*    Recent Labs Lab 05/19/14 1858  LIPASE 39   No results found for this basename: AMMONIA,  in the last 168 hours CBC:  Recent Labs Lab 05/19/14 1858 05/20/14 0359 05/20/14 0947 05/21/14 0812 05/22/14 0322  WBC 28.3* 22.6*  --  16.0* 13.9*  NEUTROABS 24.9*  --   --  13.3* 10.4*  HGB 8.8* 6.6* 6.8* 8.7* 8.1*  HCT 26.4* 20.1* 20.2* 25.6* 24.4*  MCV 92.3 92.6  --  89.8 92.1  PLT 257 188  --  180 197   Cardiac Enzymes: No  results found for this basename: CKTOTAL, CKMB, CKMBINDEX, TROPONINI,  in the last 168 hours BNP (last 3 results)  Recent Labs  05/11/14 1318  PROBNP 173.9*   CBG: No results found for this basename: GLUCAP,  in the last 168 hours  Recent Results (from the past 240 hour(s))  CULTURE, BLOOD (ROUTINE X 2)     Status: None   Collection Time    05/19/14  6:55 PM      Result Value Ref Range Status   Specimen Description BLOOD ARM LEFT   Final   Special Requests BOTTLES DRAWN AEROBIC AND ANAEROBIC 5CC   Final   Culture  Setup Time     Final   Value: 05/20/2014 01:19     Performed at [facility]   Culture     Final   Value: STAPHYLOCOCCUS AUREUS     Note: RIFAMPIN AND GENTAMICIN SHOULD NOT BE USED AS SINGLE DRUGS FOR TREATMENT OF STAPH INFECTIONS. This organism is presumed to be Clindamycin resistant based on detection of inducible Clindamycin resistance.     Note: Gram Stain Report Called to,Read Back By and Verified With: DARA A@1 :15PM ON 05/20/14 BY DANTS  Performed at [facility]   Report Status 05/22/2014 FINAL   Final   Organism ID, Bacteria STAPHYLOCOCCUS AUREUS   Final  CULTURE, BLOOD (ROUTINE X 2)     Status: None   Collection Time    05/19/14  7:27 PM      Result Value Ref Range Status   Specimen Description BLOOD ARM LEFT   Final   Special Requests BOTTLES DRAWN AEROBIC AND ANAEROBIC 10CC   Final   Culture  Setup Time     Final   Value: 05/20/2014 01:19     Performed at [facility]   Culture     Final   Value: STAPHYLOCOCCUS AUREUS     Note: SUSCEPTIBILITIES PERFORMED ON PREVIOUS CULTURE WITHIN THE LAST 5 DAYS.     Note: Gram Stain Report Called to,Read Back By and Verified With: DARA A@1 :15PM ON 05/20/14 BY DANTS     Performed at [facility]   Report Status 05/22/2014 FINAL   Final  URINE CULTURE     Status: None   Collection Time    05/19/14  8:01 PM      Result Value Ref Range Status   Specimen Description URINE, RANDOM    Final   Special Requests NONE   Final   Culture  Setup Time     Final   Value: 05/19/2014 20:34     Performed at [place]     Final   Value: NO GROWTH     Performed at [facility]   Culture     Final   Value: NO GROWTH     Performed at [facility]   Report Status 05/20/2014 FINAL   Final  MRSA PCR SCREENING     Status: None   Collection Time    05/19/14  9:51 PM      Result Value Ref Range Status   MRSA by PCR NEGATIVE  NEGATIVE Final   Comment:            The GeneXpert MRSA Assay (FDA     approved for NASAL specimens     only), is one component of a     comprehensive MRSA colonization     surveillance program. It is not     intended to diagnose MRSA     infection nor to guide or     monitor treatment for     MRSA infections.  CULTURE, ROUTINE-ABSCESS     Status: None   Collection Time    05/21/14  7:40 PM      Result Value Ref Range Status   Specimen Description ABSCESS PORTA CATH EXIT SITE   Final   Special Requests POF VANC MAXIPINE NAFICILLIN   Final   Gram Stain     Final   Value: RARE WBC PRESENT, PREDOMINANTLY PMN     NO ORGANISMS SEEN     Performed at [facility]   Culture PENDING   Incomplete   Report Status PENDING   Incomplete  ANAEROBIC CULTURE     Status: None   Collection Time    05/21/14  7:40 PM      Result Value Ref Range Status   Specimen Description ABSCESS PORTA CATH EXIT SITE   Final   Special Requests POF VANC MAXIPINE NAFICILLIN   Final   Gram Stain     Final   Value: RARE WBC PRESENT, PREDOMINANTLY PMN     NO ORGANISMS SEEN  Performed at [facility]     Final   Value: NO ANAEROBES ISOLATED; CULTURE IN PROGRESS FOR 5 DAYS     Performed at [facility]   Report Status PENDING   Incomplete     Studies:  Recent x-ray studies have been reviewed in detail by the Attending Physician  Scheduled Meds:  Scheduled Meds: . carvedilol  3.125 mg Oral BID WC  .  ferrous sulfate  325 mg Oral TID WC  . nafcillin IV  2 g Intravenous 6 times per day  . pantoprazole  40 mg Oral Daily  . vancomycin  1,250 mg Intravenous Q8H  . vitamin C  500 mg Oral TID    Time spent on care of this patient: 40 mins   Allie Bossier , MD   [facility] Office  [phone] Pager - [phone]  On-Call/Text Page:      [email or web address]      password [email or web address]  If 7PM-7AM, please contact night-coverage [email or web address] Password [email or web address] 05/22/2014, 1:56 PM   LOS: 3 days

## 2014-05-22 NOTE — Progress Notes (Addendum)
ANTICOAGULATION CONSULT NOTE - Initial Consult  Pharmacy Consult for heparin Indication: DVT  No Known Allergies  Patient Measurements: Height: 5' 5"  (165.1 cm) Weight: 207 lb 0.2 oz (93.9 kg) IBW/kg (Calculated) : 57 Heparin Dosing Weight: 78.1 kg  Vital Signs: Temp: 98.7 F (37.1 C) (06/20 0747) Temp src: Oral (06/20 0747) BP: 139/86 mmHg (06/20 0800) Pulse Rate: 85 (06/20 0800)  Labs:  Recent Labs  05/20/14 0359 05/20/14 0947 05/21/14 0546 05/21/14 0812 05/22/14 0322  HGB 6.6* 6.8*  --  8.7* 8.1*  HCT 20.1* 20.2*  --  25.6* 24.4*  PLT 188  --   --  180 197  CREATININE 1.11*  --  0.81  --  0.89    Estimated Creatinine Clearance: 95.2 ml/min (by C-G formula based on Cr of 0.89).   Medical History: Past Medical History  Diagnosis Date  . Endometriosis   . Hypertension   . Rash     fine rash on abd  . Anemia   . Wears glasses   . 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 Iron deficiency anemia, unspecified 02/25/2014  . BRCA1 positive     c.190T>G (p.Cys64Gly) @ [facility]     Medications:  Scheduled:  . carvedilol  3.125 mg Oral BID WC  . enoxaparin (LOVENOX) injection  40 mg Subcutaneous QHS  . ferrous sulfate  325 mg Oral TID WC  . nafcillin IV  2 g Intravenous 6 times per day  . pantoprazole  40 mg Oral Daily  . vancomycin  1,250 mg Intravenous Q8H  . vitamin C  500 mg Oral TID   Infusions:  . sodium chloride    . sodium chloride 1,000 mL (05/19/14 2104)  . sodium chloride 100 mL/hr at 05/21/14 2130    Assessment: 52 yoF undergoing chemotherapy for breast cancer.  Blood cultures were positive for staph bacteremia.  She is to start heparin drip for large DVT burden right subclavian/IJ.  Patient has been on Lovenox daily for prophylaxis and last dose was given at 2138 yesterday.  H/H low but stable and plt WNL. The port was removed yesterday at approximately 2000.  Will get baseline PTT and INR and begin drip now.   Goal of Therapy:  Heparin level  0.3-0.7 units/ml Monitor platelets by anticoagulation protocol: Yes   Plan:  Heparin bolus 3900 units, then begin drip at 1300 units/hr Baseline HL, PTT Check 6 hr HL  Daily HL and CBC  Thank you, Vivia Ewing, PharmD Clinical Pharmacist - Resident Pager: [phone] Pharmacy: [phone] 05/22/2014 10:10 AM   Addendum: Heparin now off Heparin to start at 2000 this evening without bolus

## 2014-05-22 NOTE — Progress Notes (Signed)
INITIAL NUTRITION ASSESSMENT  DOCUMENTATION CODES Per approved criteria  -Obesity Unspecified   INTERVENTION: Ensure Complete po BID, each supplement provides 350 kcal and 13 grams of protein  NUTRITION DIAGNOSIS: Inadequate oral intake related to infection/poor appetite as evidenced by Meal Completion: <50%.   Goal: Pt to meet >/= 90% of their estimated nutrition needs   Monitor:  PO intake, supplement acceptance, weight trend, labs   Reason for Assessment: MD Consult   43 y.o. female  Admitting Dx: Sepsis  ASSESSMENT: Pt with hx of breast ca dx 3/15 currently on chemo. Last chemo 05/13/14. Pt admitted with fevers, now s/p removal of port-a-cath secondary to port site infection and bacteremia.  Pt with diarrhea, has been on antibiotics for weeks PTA, c.diff pending.  Spoke with pt and husband. Per pt she has not lost any weight and in fact has gained due to steroids. She has had very poor intake for the past week.  Nutrition-focused physical exam WNL.   Height: Ht Readings from Last 1 Encounters:  05/19/14 5' 5"  (1.651 m)    Weight: Wt Readings from Last 1 Encounters:  05/22/14 207 lb 0.2 oz (93.9 kg)    Ideal Body Weight: 56.8 kg   % Ideal Body Weight: 165%  Wt Readings from Last 10 Encounters:  05/22/14 207 lb 0.2 oz (93.9 kg)  05/22/14 207 lb 0.2 oz (93.9 kg)  05/14/14 198 lb 1.6 oz (89.858 kg)  05/06/14 195 lb 8 oz (88.678 kg)  05/05/14 198 lb 8 oz (90.039 kg)  04/29/14 194 lb 1.6 oz (88.043 kg)  04/27/14 194 lb 9.6 oz (88.27 kg)  04/15/14 195 lb 12.8 oz (88.814 kg)  04/08/14 186 lb 1.6 oz (84.414 kg)  04/05/14 187 lb (84.823 kg)    Usual Body Weight: 187 lb   % Usual Body Weight: >100%  BMI:  Body mass index is 34.45 kg/(m^2).  Estimated Nutritional Needs: Kcal: 1900-2100 Protein: 95-115 grams Fluid: >1.9 L/day  Skin: right chest incision  Diet Order: General Meal Completion: 10%  EDUCATION NEEDS: -No education needs identified at this  time   Intake/Output Summary (Last 24 hours) at 05/22/14 1338 Last data filed at 05/22/14 0915  Gross per 24 hour  Intake   3750 ml  Output      0 ml  Net   3750 ml    Last BM: 6/19   Labs:   Recent Labs Lab 05/20/14 0359 05/21/14 0546 05/22/14 0322  NA 137 139 140  K 3.6* 3.6* 3.6*  CL 103 104 103  CO2 17* 19 20  BUN 12 7 5*  CREATININE 1.11* 0.81 0.89  CALCIUM 8.0* 8.7 8.4  MG  --  2.2 1.9  GLUCOSE 144* 102* 102*    CBG (last 3)  No results found for this basename: GLUCAP,  in the last 72 hours  Scheduled Meds: . carvedilol  3.125 mg Oral BID WC  . ferrous sulfate  325 mg Oral TID WC  . nafcillin IV  2 g Intravenous 6 times per day  . pantoprazole  40 mg Oral Daily  . vancomycin  1,250 mg Intravenous Q8H  . vitamin C  500 mg Oral TID    Continuous Infusions: . sodium chloride    . sodium chloride 1,000 mL (05/19/14 2104)  . sodium chloride 100 mL/hr at 05/21/14 2130  . heparin 1,300 Units/hr (05/22/14 1308)    Past Medical History  Diagnosis Date  . Endometriosis   . Hypertension   . Rash  fine rash on abd  . Anemia   . Wears glasses   . 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 Iron deficiency anemia, unspecified 02/25/2014  . BRCA1 positive     c.190T>G (p.Cys64Gly) @ [facility]     Past Surgical History  Procedure Laterality Date  . Cervical polypectomy  2010  . Cesarean section      one previous  . Tubal ligation    . Portacath placement Right 02/23/2014    Procedure: INSERTION PORT-A-CATH;  Surgeon: Joyice Faster. Cornett, MD;  Location: [facility];  Service: General;  Laterality: Right;    [place], [place], [place] Pager [phone] After Hours Pager

## 2014-05-22 NOTE — Progress Notes (Signed)
1 Day Post-Op  Subjective: Weak, sore, no appetite.   Objective: Vital signs in last 24 hours: Temp:  [98.4 F (36.9 C)-99.8 F (37.7 C)] 98.7 F (37.1 C) (06/20 0747) Pulse Rate:  [78-98] 78 (06/20 1000) Resp:  [25-38] 25 (06/19 2036) BP: (114-139)/(67-86) 139/86 mmHg (06/20 0800) SpO2:  [97 %-100 %] 99 % (06/20 1000) Weight:  [207 lb 0.2 oz (93.9 kg)] 207 lb 0.2 oz (93.9 kg) (06/20 0434) Last BM Date: 05/20/14  Intake/Output from previous day: 06/19 0701 - 06/20 0700 In: 3495 [I.V.:3095; IV Piggyback:400] Out: 200 [Urine:200] Intake/Output this shift: Total I/O In: 550 [P.O.:100; I.V.:200; IV Piggyback:250] Out: -   Alert, nad Rt upper chest - open port site. No cellulitis. TTP around area.   Lab Results:   Recent Labs  05/21/14 0812 05/22/14 0322  WBC 16.0* 13.9*  HGB 8.7* 8.1*  HCT 25.6* 24.4*  PLT 180 197   BMET  Recent Labs  05/21/14 0546 05/22/14 0322  NA 139 140  K 3.6* 3.6*  CL 104 103  CO2 19 20  GLUCOSE 102* 102*  BUN 7 5*  CREATININE 0.81 0.89  CALCIUM 8.7 8.4   PT/INR No results found for this basename: LABPROT, INR,  in the last 72 hours ABG No results found for this basename: PHART, PCO2, PO2, HCO3,  in the last 72 hours  Studies/Results: No results found.  Anti-infectives: Anti-infectives   Start     Dose/Rate Route Frequency Ordered Stop   05/21/14 2230  vancomycin (VANCOCIN) 1,250 mg in sodium chloride 0.9 % 250 mL IVPB     1,250 mg 166.7 mL/hr over 90 Minutes Intravenous Every 8 hours 05/21/14 2211     05/21/14 1600  nafcillin 2 g in dextrose 5 % 50 mL IVPB     2 g 100 mL/hr over 30 Minutes Intravenous 6 times per day 05/21/14 1326     05/21/14 1300  nafcillin injection 2 g  Status:  Discontinued     2 g Intravenous Every 6 hours 05/21/14 1227 05/21/14 1230   05/21/14 1300  nafcillin 2 g in dextrose 5 % 50 mL IVPB  Status:  Discontinued     2 g 100 mL/hr over 30 Minutes Intravenous 4 times per day 05/21/14 1230 05/21/14  1326   05/21/14 1230  nafcillin injection 2 g  Status:  Discontinued     2 g Intravenous Every 6 hours 05/21/14 1225 05/21/14 1226   05/20/14 0600  ceFEPIme (MAXIPIME) 1 g in dextrose 5 % 50 mL IVPB  Status:  Discontinued     1 g 100 mL/hr over 30 Minutes Intravenous 3 times per day 05/19/14 1912 05/21/14 1326   05/20/14 0400  vancomycin (VANCOCIN) IVPB 1000 mg/200 mL premix  Status:  Discontinued     1,000 mg 200 mL/hr over 60 Minutes Intravenous Every 8 hours 05/19/14 1912 05/21/14 2211   05/19/14 1915  ceFEPIme (MAXIPIME) 2 g in dextrose 5 % 50 mL IVPB     2 g 100 mL/hr over 30 Minutes Intravenous  Once 05/19/14 1904 05/19/14 2108   05/19/14 1915  vancomycin (VANCOCIN) IVPB 1000 mg/200 mL premix     1,000 mg 200 mL/hr over 60 Minutes Intravenous  Once 05/19/14 1904 05/19/14 2108      Assessment/Plan: s/p Procedure(s): REMOVAL PORT-A-CATH (N/A) secondary to port site infection and bacteremia  Change gauze daily; cover with gauze and paper tape Will remove packing strip Sunday Ok to anticoagulate starting now for IJ/subclavian thrombosis.  Leighton Ruff. Redmond Pulling, MD, FACS General, Bariatric, & Minimally Invasive Surgery [facility], [place]     LOS: 3 days    Tamara [name] 05/22/2014

## 2014-05-22 NOTE — Progress Notes (Signed)
MEDICAL ONCOLOGY 05/22/2014, 9:27 AM  Hospital day 4 Antibiotics: day 2 nafcillin/ vanc Chemotherapy: cycle 4 neoadjuvant carboplatin/ taxotere/herceptin/ pertuzumab given 05-13-14.  Cycle 5 due 05-27-14, tho may have to delay depending on situation frin staph sepsis then.  Outpatient Physicians: (K.Khan), T.Cornett, D.Bensimhon, C.Sanger, J.Kinard   Portacath removed emergently by Dr Grandville Silos last pm - thank you! Patient seen now, with husband at bedside.  Subjective: "I feel so much better". Right neck still painful tho not as severe, not too uncomfortable at site of previous PAC. Still somewhat SOB, including when up to BR this AM, but also improved. Bowels moved x3 yesterday, not watery, none as yet today. Some nausea not controlled with zofran 4 mg --will increase this to 8 mg which is better dose for chemo nausea. Little appetite, ate a few bites of oatmeal and apple juice this AM -- dietician evaluation requested by MD now. States voiding without difficulty.  Husband stayed overnight, very supportive.  ONCOLOGY HISTORY Upper outer quadrant left breast mass found by patient, with mammogram at [facility] 01-18-14 with 3.2 cm mass @ 2:00, 7 cm from nipple, another 8 mm mass in left breast and right axillary adenopathy. 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 MRI 3-9-15confirmed 2.8 x 2.5x 3.1cm mass + 1.3 x0.8 x 0.8 cm mass superior and posterior to other area as well as lymph nodes up to 2.4 cm in left axilla. CT CAP 02-19-14 and PET 1-66-06 had hypermetabolic left breast masses and left axillary nodes, lungs clear, abdomen not remarkable, FDG activity in endometrial canal without obvious endometrial abnormality on CT, slightly enlarged uterus with fibroids, ovaries normal, no abdominal or pelvic  adenopathy, no evidence of skeletal metastasis. Genetics testing documented BRCA 1 +. Echocardiogram 05-05-14 had EF 45-50%; repeat echocardiogram 05-05-14 had EF 50-55%. She began neoadjuvant treatment with carboplatin, taxotere, herceptin, perjeta on 02-25-14, planned q 3 weeks for 6 cycles. PAC (right) was placed by Dr Brantley Stage on 02-23-14. She was seen in ED 03-08-14 for right neck pain, then by surgery 03-09-14 with apparent mild infection at incision for PAC, improved with oral antibiotics. She had mild erythema at [facility] again 03-25-14, treated with antibiotics thru at least 5-14 or possibly 04-27-14; PAC appeared ready for use by 04-27-14. She was seen in ED on 05-11-14 with central chest pain, CT angio chest no PE and no evidence of cardiac etiology; she was still uncomfortable but not febrile at [facility] 05-14-14, with new elevation in AST to 59 and ALT to 145. She was progressively more uncomfortable and had fever for several days prior to admission from ED on 05-19-14, (tho had not contacted physicians about that and had not returned phone calls from [facility]).   Objective: Vital signs in last 24 hours: Blood pressure 139/86, pulse 85, temperature 98.7 F (37.1 C), temperature source Oral, resp. rate 25, height 5' 5" (1.651 m), weight 207 lb 0.2 oz (93.9 kg), last menstrual period 02/19/2014, SpO2 97.00%.  IV NS at 125  Intake/Output from previous day: 06/19 0701 - 06/20 0700 In: 3495 [I.V.:3095; IV Piggyback:400] Out: 200 [Urine:200] Intake/Output this shift: Total I/O In: 550 [P.O.:100; I.V.:200; IV Piggyback:250] Out: -   Physical exam: Propped up in bed, looks clearly better. Awake, alert, easily conversant. Alopecia. Pale conjunctivae and mucous membranes.  PERRL, not icteric.  Oral mucosa sl coated, moist, no lesions. Right neck somewhat tender, no heat or erythema and no clear cord there. Serosanguinous drainage on dressing right anterior chest. Lungs without wheezes or rales. Heart RRR, not  as tachy. No gallop. Cannot appreciate murmur. Abdomen slightly distended with active bowel sounds, soft, not tender. LE with PAS on, no edema or cords, feet warm. Moves all extremities easily. Peripheral IV left wrist site ok. Speech fluent and appropriate; mood and affect appropriate.  Lab Results:  Recent Labs  05/21/14 0812 05/22/14 0322  WBC 16.0* 13.9*  HGB 8.7* 8.1*  HCT 25.6* 24.4*  PLT 180 197   BMET  Recent Labs  05/21/14 0546 05/22/14 0322  NA 139 140  K 3.6* 3.6*  CL 104 103  CO2 19 20  GLUCOSE 102* 102*  BUN 7 5*  CREATININE 0.81 0.89  CALCIUM 8.7 8.4  AST 16, ALT 45, AP 114, total protein 5.9, alb 2.1    Sensitivities of staph in 2/2 peripheral blood cultures from 05-19-14 still pending Routine and anaerobic cultures from fluid pocket at [facility] pending from 05-21-14. Urine culture 6-17 negative.  C.Diff ordered and pending  Studies/Results: No results found. TEE pending MRI Lspine pending  Assessment/Plan: 1. Staph sepsis from [facility] source: clinically improved in past 18 hours with removal of PAC and on nafcillin/vanc. Sensitivities pending, TEE and MRI back pending. LFTs improving. Will need to hold neoadjuvant chemo until ok by ID to continue. 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 Diagnosed 01-2014 by needle biopsy. Need to continue neoadjuvant chemotherapy as soon as appropriate from standpoint of the staph sepsis, due next on 05-27-14. Will need recommendations from ID for timing of further chemo please. 3. Poor peripheral IV access: would manage with peripheral IVs for antibiotics at least next several days if possible, then likely PICC will be best for now. 4.new diarrhea: has been on antibiotics x weeks PTA. Stool for C.diff ordered. Would do contact precautions if actually having diarrhea 5.poor nutritional status: dietician consult requested, suggest trying  supplements 6.anemia: iron studies and B12 not low, ferritin elevated as acute phase reactant, folate normal, haptoglobin not low. Hemoccult pending. Was transfused 2 units PRBCs on 05-20-14. Note Hgb 8.0 in Jan 2015. Had IV iron 01-3014. 5.SOB, cough: CXR on admission ok. Seems better this AM. 6.uterus with fibroids and some uptake in endometrial canal ?due to ovulatory phase. I cannot tell from EMR when last gyn exam, and apparently no gynecologist per Dr Laurelyn Sickle note on 02-25-14.  Please call if our group can assist prior to my rounds tomorrow   [phone]. Thank you LIVESAY,LENNIS P MD

## 2014-05-22 NOTE — Progress Notes (Addendum)
[facility] for Infectious Disease    Date of Admission:  05/19/2014   Total days of antibiotics 4        Day 4 vanco        Day 2 nafcillin        ID: Tamara [name] is a 43 y.o. female with breast cancer undergoing adjuvent chemo just finished 4th cycle presents with fever, found to have staph aureus bacteremia Principal Problem:   Sepsis Active Problems:   Breast cancer of upper-outer quadrant of left female breast   HTN (hypertension)   Iron deficiency anemia, unspecified   HCAP (healthcare-associated pneumonia)   Sinus tachycardia   AKI (acute kidney injury)   Acute blood loss anemia   Staphylococcal sepsis    Subjective: Afebrile, Underwent port a cath removal, also found to have RUE DVT. Mildly tender at port a cath removal  Medications:  . carvedilol  3.125 mg Oral BID WC  . ferrous sulfate  325 mg Oral TID WC  . heparin  3,900 Units Intravenous Once  . nafcillin IV  2 g Intravenous 6 times per day  . pantoprazole  40 mg Oral Daily  . vancomycin  1,250 mg Intravenous Q8H  . vitamin C  500 mg Oral TID    Objective: Vital signs in last 24 hours: Temp:  [98.3 F (36.8 C)-99.8 F (37.7 C)] 98.7 F (37.1 C) (06/20 0747) Pulse Rate:  [74-98] 78 (06/20 1000) Resp:  [25-38] 25 (06/19 2036) BP: (114-155)/(67-86) 139/86 mmHg (06/20 0800) SpO2:  [97 %-100 %] 99 % (06/20 1000) Weight:  [207 lb 0.2 oz (93.9 kg)] 207 lb 0.2 oz (93.9 kg) (06/20 0434) Physical Exam  Constitutional:  oriented to person, place, and time. appears well-developed and well-nourished. No distress. Sitting up in bed talking with her friends HENT:  Mouth/Throat: Oropharynx is clear and moist. No oropharyngeal exudate.  Lymphadenopathy: no cervical adenopathy.  Skin: Skin is warm and dry. No rash noted. No erythema.   Lab Results  Recent Labs  05/21/14 0546 05/21/14 0812 05/22/14 0322  WBC  --  16.0* 13.9*  HGB  --  8.7* 8.1*  HCT  --  25.6* 24.4*  NA 139  --  140  K 3.6*  --  3.6*    CL 104  --  103  CO2 19  --  20  BUN 7  --  5*  CREATININE 0.81  --  0.89   Liver Panel  Recent Labs  05/19/14 1858 05/21/14 0546 05/22/14 0322  PROT 7.7 6.3 5.9*  ALBUMIN 3.2* 2.4* 2.1*  AST 30 33 16  ALT 84* 66* 45*  ALKPHOS 166* 171* 114  BILITOT 0.8 0.4 0.6  BILIDIR 0.3  --   --   IBILI 0.5  --   --    Sedimentation Rate No results found for this basename: ESRSEDRATE,  in the last 72 hours C-Reactive Protein No results found for this basename: CRP,  in the last 72 hours  Microbiology: 6/17 blood cx staph aureus:MSSA 6/19 port cx pending  Studies/Results: No results found.   Assessment/Plan: MSSA bacteremia = would repeat peripheral blood cx today to document clearance of bacteremia. Appreciate having her port removed quickly. Continue on  nafcillin for MSSA and discontinue vancomycin  Recommend to get lumbar-thoracic mri of spine to evaluate for metastatic staph aureus disease, such as epidural abscess or psoas m abscess since she reported having back pain  Would wait to place any picc line until we  can document that she has cleared bacteremia from blood stream.  Improved leukocytosis and fever suggests response to therapy  Cynitha Berte B. [facility] [place], [facility] Cell: [phone] Pager: [phone]  05/22/2014, 10:20 AM

## 2014-05-23 DIAGNOSIS — I82C19 Acute embolism and thrombosis of unspecified internal jugular vein: Secondary | ICD-10-CM

## 2014-05-23 LAB — CBC WITH DIFFERENTIAL/PLATELET
Basophils Absolute: 0 K/uL (ref 0.0–0.1)
Basophils Relative: 0 % (ref 0–1)
Eosinophils Absolute: 0 K/uL (ref 0.0–0.7)
Eosinophils Relative: 0 % (ref 0–5)
HCT: 26.4 % — ABNORMAL LOW (ref 36.0–46.0)
Hemoglobin: 8.7 g/dL — ABNORMAL LOW (ref 12.0–15.0)
Lymphocytes Relative: 19 % (ref 12–46)
Lymphs Abs: 2.2 K/uL (ref 0.7–4.0)
MCH: 30.6 pg (ref 26.0–34.0)
MCHC: 33 g/dL (ref 30.0–36.0)
MCV: 93 fL (ref 78.0–100.0)
Monocytes Absolute: 1.1 K/uL — ABNORMAL HIGH (ref 0.1–1.0)
Monocytes Relative: 9 % (ref 3–12)
Neutro Abs: 8.5 K/uL — ABNORMAL HIGH (ref 1.7–7.7)
Neutrophils Relative %: 72 % (ref 43–77)
Platelets: 259 K/uL (ref 150–400)
RBC: 2.84 MIL/uL — ABNORMAL LOW (ref 3.87–5.11)
RDW: 18.9 % — ABNORMAL HIGH (ref 11.5–15.5)
WBC: 11.7 K/uL — ABNORMAL HIGH (ref 4.0–10.5)

## 2014-05-23 LAB — COMPREHENSIVE METABOLIC PANEL
ALT: 41 U/L — AB (ref 0–35)
AST: 23 U/L (ref 0–37)
Albumin: 2.3 g/dL — ABNORMAL LOW (ref 3.5–5.2)
Alkaline Phosphatase: 105 U/L (ref 39–117)
BUN: 4 mg/dL — ABNORMAL LOW (ref 6–23)
CALCIUM: 8.7 mg/dL (ref 8.4–10.5)
CO2: 24 mEq/L (ref 19–32)
Chloride: 105 mEq/L (ref 96–112)
Creatinine, Ser: 0.88 mg/dL (ref 0.50–1.10)
GFR calc non Af Amer: 81 mL/min — ABNORMAL LOW (ref >90)
GLUCOSE: 104 mg/dL — AB (ref 70–99)
Potassium: 3.7 mEq/L (ref 3.7–5.3)
SODIUM: 143 meq/L (ref 137–147)
TOTAL PROTEIN: 6.3 g/dL (ref 6.0–8.3)
Total Bilirubin: 0.6 mg/dL (ref 0.3–1.2)

## 2014-05-23 LAB — HEPARIN LEVEL (UNFRACTIONATED)
Heparin Unfractionated: <0.1 IU/mL — ABNORMAL LOW (ref 0.30–0.70)
Heparin Unfractionated: 0.26 [IU]/mL — ABNORMAL LOW (ref 0.30–0.70)

## 2014-05-23 LAB — MAGNESIUM: Magnesium: 1.8 mg/dL (ref 1.5–2.5)

## 2014-05-23 NOTE — Progress Notes (Signed)
2 Days Post-Op  Subjective: Stable. Alert. Still says her right neck hurts.  Gram stain and cultures without meaningful report yet.  Objective: Vital signs in last 24 hours: Temp:  [98.5 F (36.9 C)-100.2 F (37.9 C)] 98.8 F (37.1 C) (06/21 0905) Pulse Rate:  [84-101] 89 (06/21 0905) Resp:  [20] 20 (06/21 0905) BP: (130-149)/(70-88) 149/84 mmHg (06/21 0905) SpO2:  [90 %-98 %] 96 % (06/21 0453) Weight:  [207 lb 7.3 oz (94.1 kg)] 207 lb 7.3 oz (94.1 kg) (06/21 0453) Last BM Date: 05/20/14  Intake/Output from previous day: 06/20 0701 - 06/21 0700 In: 3143.6 [P.O.:150; I.V.:2443.6; IV Piggyback:550] Out: -  Intake/Output this shift: Total I/O In: 240 [P.O.:240] Out: -   General appearance: alert. Cooperative. Does not appear in distress. Not toxic. Skin:  right infraclavicular wound examined. Wound open. Saline is subsiding. Minimal purulence. Iodoform wick removed. Redressed. Discussed wound care with nurse.  Lab Results:  Results for orders placed during the hospital encounter of 05/19/14 (from the past 24 hour(s))  CLOSTRIDIUM DIFFICILE BY PCR     Status: None   Collection Time    05/22/14  2:14 PM      Result Value Ref Range   C difficile by pcr NEGATIVE  NEGATIVE  HEPARIN LEVEL (UNFRACTIONATED)     Status: Abnormal   Collection Time    05/23/14  3:30 AM      Result Value Ref Range   Heparin Unfractionated <0.10 (*) 0.30 - 0.70 IU/mL  COMPREHENSIVE METABOLIC PANEL     Status: Abnormal   Collection Time    05/23/14  3:30 AM      Result Value Ref Range   Sodium 143  137 - 147 mEq/L   Potassium 3.7  3.7 - 5.3 mEq/L   Chloride 105  96 - 112 mEq/L   CO2 24  19 - 32 mEq/L   Glucose, Bld 104 (*) 70 - 99 mg/dL   BUN 4 (*) 6 - 23 mg/dL   Creatinine, Ser 0.88  0.50 - 1.10 mg/dL   Calcium 8.7  8.4 - 10.5 mg/dL   Total Protein 6.3  6.0 - 8.3 g/dL   Albumin 2.3 (*) 3.5 - 5.2 g/dL   AST 23  0 - 37 U/L   ALT 41 (*) 0 - 35 U/L   Alkaline Phosphatase 105  39 - 117 U/L    Total Bilirubin 0.6  0.3 - 1.2 mg/dL   GFR calc non Af Amer 81 (*) >90 mL/min   GFR calc Af Amer >90  >90 mL/min  CBC WITH DIFFERENTIAL     Status: Abnormal   Collection Time    05/23/14  3:30 AM      Result Value Ref Range   WBC 11.7 (*) 4.0 - 10.5 K/uL   RBC 2.84 (*) 3.87 - 5.11 MIL/uL   Hemoglobin 8.7 (*) 12.0 - 15.0 g/dL   HCT 26.4 (*) 36.0 - 46.0 %   MCV 93.0  78.0 - 100.0 fL   MCH 30.6  26.0 - 34.0 pg   MCHC 33.0  30.0 - 36.0 g/dL   RDW 18.9 (*) 11.5 - 15.5 %   Platelets 259  150 - 400 K/uL   Neutrophils Relative % 72  43 - 77 %   Neutro Abs 8.5 (*) 1.7 - 7.7 K/uL   Lymphocytes Relative 19  12 - 46 %   Lymphs Abs 2.2  0.7 - 4.0 K/uL   Monocytes Relative 9  3 - 12 %  Monocytes Absolute 1.1 (*) 0.1 - 1.0 K/uL   Eosinophils Relative 0  0 - 5 %   Eosinophils Absolute 0.0  0.0 - 0.7 K/uL   Basophils Relative 0  0 - 1 %   Basophils Absolute 0.0  0.0 - 0.1 K/uL  MAGNESIUM     Status: None   Collection Time    05/23/14  3:30 AM      Result Value Ref Range   Magnesium 1.8  1.5 - 2.5 mg/dL     Studies/Results: Mr Lumbar Spine W Wo Contrast  05/22/2014   CLINICAL DATA:  Breast cancer. Staph bacteremia. Infected Port-A-Cath removed May 21, 2014. New onset lumbar spine pain.  EXAM: MRI LUMBAR SPINE WITHOUT AND WITH CONTRAST  TECHNIQUE: Multiplanar and multiecho pulse sequences of the lumbar spine were obtained without and with intravenous contrast.  CONTRAST:  27mL MULTIHANCE GADOBENATE DIMEGLUMINE 529 MG/ML IV SOLN  COMPARISON:  None.  FINDINGS: The vertebral bodies of the lumbar spine are normal in size and alignment. The marrow is diffusely low in signal likely secondary to patient receiving chemotherapy. The intervertebral disc spaces are well-maintained. There are no areas of abnormal enhancement.  The spinal cord is normal in signal and contour. The cord terminates normally at L1 . The nerve roots of the cauda equina and the filum terminale are normal. There is no epidural fluid  collection.  The visualized portions of the SI joints are unremarkable.  The imaged intra-abdominal contents are unremarkable.  T12-L1: No significant disc bulge. No evidence of neural foraminal stenosis. No central canal stenosis.  L1-L2: No significant disc bulge. No evidence of neural foraminal stenosis. No central canal stenosis.  L2-L3: No significant disc bulge. No evidence of neural foraminal stenosis. No central canal stenosis.  L3-L4: No significant disc bulge. No evidence of neural foraminal stenosis. No central canal stenosis.  L4-L5: No significant disc bulge. No evidence of neural foraminal stenosis. No central canal stenosis.  L5-S1: No significant disc bulge. No evidence of neural foraminal stenosis. No central canal stenosis.  IMPRESSION: 1. Normal MRI of the lumbar spine.   Electronically Signed   By: Kathreen Devoid   On: 05/22/2014 12:48    . carvedilol  3.125 mg Oral BID WC  . feeding supplement (ENSURE COMPLETE)  237 mL Oral BID BM  . ferrous sulfate  325 mg Oral TID WC  . nafcillin IV  2 g Intravenous 6 times per day  . pantoprazole  40 mg Oral Daily  . vitamin C  500 mg Oral TID     Assessment/Plan: s/p Procedure(s): REMOVAL PORT-A-CATH  POD#2. Removal of infected Port-A-Cath. Wound stable Wound care orders written Continue vancomycin and cefepime Check cultures tomorrow  Internal jugular, subclavian thrombosis. Okay to anticoagulate  @PROBHOSP @  LOS: 4 days    INGRAM,HAYWOOD M 05/23/2014  . .prob

## 2014-05-23 NOTE — Progress Notes (Signed)
ANTICOAGULATION CONSULT NOTE   Pharmacy Consult for heparin Indication: DVT  No Known Allergies  Patient Measurements: Height: 5' 5"  (165.1 cm) Weight: 207 lb 7.3 oz (94.1 kg) IBW/kg (Calculated) : 57 Heparin Dosing Weight: 78.1 kg  Vital Signs: Temp: 100.5 F (38.1 C) (06/21 1620) Temp src: Oral (06/21 1620) BP: 151/89 mmHg (06/21 1620) Pulse Rate: 83 (06/21 1620)  Labs:  Recent Labs  05/21/14 0546  05/21/14 0812 05/22/14 0322 05/23/14 0330 05/23/14 1110 05/23/14 2008  HGB  --   < > 8.7* 8.1* 8.7*  --   --   HCT  --   --  25.6* 24.4* 26.4*  --   --   PLT  --   --  180 197 259  --   --   HEPARINUNFRC  --   --   --   --  <0.10* <0.10* 0.26*  CREATININE 0.81  --   --  0.89 0.88  --   --   < > = values in this interval not displayed.  Estimated Creatinine Clearance: 96.3 ml/min (by C-G formula based on Cr of 0.88).   Medical History: Past Medical History  Diagnosis Date  . Endometriosis   . Hypertension   . Rash     fine rash on abd  . Anemia   . Wears glasses   . 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 Iron deficiency anemia, unspecified 02/25/2014  . BRCA1 positive     c.190T>G (p.Cys64Gly) @ [facility]     Medications:  Scheduled:  . carvedilol  3.125 mg Oral BID WC  . feeding supplement (ENSURE COMPLETE)  237 mL Oral BID BM  . ferrous sulfate  325 mg Oral TID WC  . nafcillin IV  2 g Intravenous 6 times per day  . pantoprazole  40 mg Oral Daily  . vitamin C  500 mg Oral TID   Infusions:  . sodium chloride    . sodium chloride 1,000 mL (05/19/14 2104)  . sodium chloride 100 mL/hr at 05/23/14 1524  . heparin 1,900 Units/hr (05/23/14 1235)    Assessment: 35 yoF undergoing chemotherapy for breast cancer admitted 05/19/2014  .  Blood cultures were positive for staph bacteremia.  Pharmacy consulted to dose  heparin drip for large DVT burden right subclavian/IJ.    Coag: DVT: Heparin remains below goal.  H/H low but stable and plt WNL.  No problems with  infusion per nurse.  Will increase rate without bolus per consult.    Goal of Therapy:  Heparin level 0.3-0.7 units/ml Monitor platelets by anticoagulation protocol: Yes   Plan:  Increase Heparin to 2100 units/hr Daily HL and CBC  Thank you for allowing pharmacy to be a part of this patients care team.  Rowe Robert Pharm.D., BCPS, AQ-Cardiology Clinical Pharmacist 05/23/2014 8:45 PM Pager: [phone] Phone: [phone]

## 2014-05-23 NOTE — Progress Notes (Signed)
MEDICAL ONCOLOGY 05/23/2014, 9:38 AM  Hospital day 5 Antibiotics: nafcillin Chemotherapy: cycle 4 neoadjuvant carboplatin/taxotere/herceptin/pertuzumab given 05-13-14. Due cycle 5 05-27-14, may have to delay depending on status of infection. Anticoagulation: heparin qtt per pharmacy  Outpatient Physicians: (K.Khan), T.Cornett, D.Bensimhon, C.Sanger, J.Kinard   EMR reviewed. Patient seen, husband at bedside. Spoke with RN on unit. I do not see report of neck US, but reportedly DVT there.  Subjective: Feeling a little better, able to drink Ensure during night and ate a little breakfast this AM. Still somewhat weak when up. Still very tender right neck, minimally sore at site of previous PAC. No swelling RUE. No bowel movement today. Less SOB, less cough still NP. New swelling left hand this AM, with peripheral IVs x2 on that side. Slight swelling in feet bilaterally. She has been unable to feel mass in left breast for several weeks, easily palpable prior to start of chemo. Has not been up in chair, OOB only to BR. Did sleep last PM. I have told her stool for C diff negative and nothing of concern on MRI LS.  Objective: Vital signs in last 24 hours: Blood pressure 149/84, pulse 89, temperature 98.8 F (37.1 C), temperature source Oral, resp. rate 20, height 5' 5"  (1.651 m), weight 207 lb 7.3 oz (94.1 kg), last menstrual period 02/19/2014, SpO2 96.00%. Tmax 101 last pm  Intake/Output from previous day: 06/20 0701 - 06/21 0700 In: 3143.6 [P.O.:150; I.V.:2443.6; IV Piggyback:550] Out: -  Intake/Output this shift:    Physical exam: awake, alert, looks generally somewhat weak but NAD sitting in bed on RA. Mucous membranes and conjunctivae pale, not icteric. Mouth clear and moist. Lungs without wheezes or crackles anteriorly and posteriorly. Heart RRR no gallop. Right neck tender to light touch, probable cord, no erythema. Dressing at previous PAC site with serosanguinous drainage. Abdomen soft,  not tender, few BS. LE no pitting edema including feet, no cords or tenderness. Left hand and lower arm with swelling, IVs infusing at wrist and antecubital. Moves all extremities easily. Left breast with minimal thickening upper outer quadrant but no discreet mass, no skin findings, nothing palpable in left axilla.  Neuro nonfocal.  Lab Results:  Recent Labs  05/22/14 0322 05/23/14 0330  WBC 13.9* 11.7*  HGB 8.1* 8.7*  HCT 24.4* 26.4*  PLT 197 259   BMET  Recent Labs  05/22/14 0322 05/23/14 0330  NA 140 143  K 3.6* 3.7  CL 103 105  CO2 20 24  GLUCOSE 102* 104*  BUN 5* 4*  CREATININE 0.89 0.88  CALCIUM 8.4 8.7   C diff negative by PCR 05-22-14 PAC wound culture still pending 2/2 peripheral blood cultures 05-19-14 with staph aureus methicillin sensitive Repeat blood cultures from 6-20 pending   Studies/Results: Mr Lumbar Spine W Wo Contrast  05/22/2014   CLINICAL DATA:  Breast cancer. Staph bacteremia. Infected Port-A-Cath removed May 21, 2014. New onset lumbar spine pain.  EXAM: MRI LUMBAR SPINE WITHOUT AND WITH CONTRAST  TECHNIQUE: Multiplanar and multiecho pulse sequences of the lumbar spine were obtained without and with intravenous contrast.  CONTRAST:  58m MULTIHANCE GADOBENATE DIMEGLUMINE 529 MG/ML IV SOLN  COMPARISON:  None.  FINDINGS: The vertebral bodies of the lumbar spine are normal in size and alignment. The marrow is diffusely low in signal likely secondary to patient receiving chemotherapy. The intervertebral disc spaces are well-maintained. There are no areas of abnormal enhancement.  The spinal cord is normal in signal and contour. The cord terminates normally at L1 .  The nerve roots of the cauda equina and the filum terminale are normal. There is no epidural fluid collection.  The visualized portions of the SI joints are unremarkable.  The imaged intra-abdominal contents are unremarkable.  T12-L1: No significant disc bulge. No evidence of neural foraminal stenosis.  No central canal stenosis.  L1-L2: No significant disc bulge. No evidence of neural foraminal stenosis. No central canal stenosis.  L2-L3: No significant disc bulge. No evidence of neural foraminal stenosis. No central canal stenosis.  L3-L4: No significant disc bulge. No evidence of neural foraminal stenosis. No central canal stenosis.  L4-L5: No significant disc bulge. No evidence of neural foraminal stenosis. No central canal stenosis.  L5-S1: No significant disc bulge. No evidence of neural foraminal stenosis. No central canal stenosis.  IMPRESSION: 1. Normal MRI of the lumbar spine.   Electronically Signed   By: Kathreen Devoid   On: 05/22/2014 12:48   ONCOLOGY HISTORY  Upper outer quadrant left breast mass found by patient, with mammogram at [facility] 01-18-14 with 3.2 cm mass @ 2:00, 7 cm from nipple, another 8 mm mass in left breast and right axillary adenopathy. 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 MRI 3-9-15confirmed 2.8 x 2.5x 3.1cm mass + 1.3 x0.8 x 0.8 cm mass superior and posterior to other area as well as lymph nodes up to 2.4 cm in left axilla. CT CAP 02-19-14 and PET 2-63-33 had hypermetabolic left breast masses and left axillary nodes, lungs clear, abdomen not remarkable, FDG activity in endometrial canal without obvious endometrial abnormality on CT, slightly enlarged uterus with fibroids, ovaries normal, no abdominal or pelvic adenopathy, no evidence of skeletal metastasis. Genetics testing documented BRCA 1 +. Echocardiogram 05-05-14 had EF 45-50%; repeat echocardiogram 05-05-14 had EF 50-55%. She began neoadjuvant treatment with carboplatin, taxotere, herceptin, perjeta on 02-25-14, planned q 3 weeks for 6 cycles. PAC (right) was placed by Dr Brantley Stage on 02-23-14. She was seen in ED 03-08-14 for right neck  pain, then by surgery 03-09-14 with apparent mild infection at incision for PAC, improved with oral antibiotics. She had mild erythema at [facility] again 03-25-14, treated with antibiotics thru at least 5-14 or possibly 04-27-14; PAC appeared ready for use by 04-27-14. She was seen in ED on 05-11-14 with central chest pain, CT angio chest no PE and no evidence of cardiac etiology; she was still uncomfortable but not febrile at [facility] 05-14-14, with new elevation in AST to 59 and ALT to 145. She was progressively more uncomfortable and had fever for several days prior to admission from ED on 05-19-14, (tho had not contacted physicians about that and had not returned phone calls from [facility]).    Assessment/Plan: 1.Methicillin sensitive staph aureus sepsis from [facility] infection: PAC removed 05-21-14, continuing nafcillin, TEE pending. ID following. 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 Due cycle 5 on 05-27-14, which may need to delay depending on status of the staph infection. Plan repeat breast MRI after neoadjuvant chemo completes, then surgery. 3.difficult peripheral venous access: IVs now infiltrated in LUE. Agree with ID that need to use peripheral IVs until document bacteremia is  cleared, then PICC. As PO intake improved, may be able to stop IV hydration to be easier on the peripheral IVs. 4.Right IJ and subclavian vein DVT: on heparin by pharmacy. I have written to try warm soaks if that helps local discomfort. Could consider full dose LMW heparin if no access for IV heparin. 5.diarrhea improved, C diff negative 6. Poor po intake: likes vanilla and strawberry Ensure and I have encouraged this. Appreciate dietician assistance 7.anemia: multifactorial. Post transfusion  on admission. 8.apparently overdue gyn exam. Note uptake in endometrial canal on PET, possibly related to phase of ovulation. 9.back pain improved, normal LS MRI  Please call if our service can help prior to next rounds. LIVESAY,LENNIS P [phone]

## 2014-05-23 NOTE — Progress Notes (Signed)
PHARMACY NOTE  Pharmacy Consult :  43 y.o. female is currently on Heparin for DVT of the right internal jugular and subclavian veins.   Heparin Dosing Wt :  78 kg  Hematology :  Recent Labs  05/20/14 0947 05/21/14 0546 05/21/14 0812 05/22/14 0322 05/23/14 0330  HGB 6.8*  --  8.7* 8.1* 8.7*  HCT 20.2*  --  25.6* 24.4* 26.4*  PLT  --   --  180 197 259  HEPARINUNFRC  --   --   --   --  <0.10*  CREATININE  --  0.81  --  0.89 0.88    Current Medication[s] Include: Medication PTA: Prescriptions prior to admission  Medication Sig Dispense Refill  . carvedilol (COREG) 3.125 MG tablet Take 1 tablet (3.125 mg total) by mouth 2 (two) times daily with a meal.  60 tablet  6  . dexamethasone (DECADRON) 4 MG tablet Take 2 tablets (8 mg total) by mouth 2 (two) times daily. Start the day before Taxotere. Then again the day after chemo for 3 days.  30 tablet  1  . ferrous sulfate 325 (65 FE) MG EC tablet Take 1 tablet (325 mg total) by mouth 3 (three) times daily with meals.  90 tablet  3  . ibuprofen (ADVIL,MOTRIN) 600 MG tablet Take 1 tablet (600 mg total) by mouth every 6 (six) hours as needed.  30 tablet  0  . lidocaine-prilocaine (EMLA) cream Apply 1 application topically as needed (For port-a-cath.).      Marland Kitchen loratadine (CLARITIN) 10 MG tablet Take 10 mg by mouth daily as needed (For inflammation after receiving her injections during chemo.).       Marland Kitchen LORazepam (ATIVAN) 0.5 MG tablet Take 1 tablet (0.5 mg total) by mouth every 6 (six) hours as needed (Nausea or vomiting).  30 tablet  0  . ondansetron (ZOFRAN) 8 MG tablet Take 1 tablet (8 mg total) by mouth 2 (two) times daily. Start the day after chemo for 3 days. Then take as needed for nausea or vomiting.  30 tablet  1  . oxycodone (OXY-IR) 5 MG capsule Take 1 tab every 4-6 hours as needed for pain  12 capsule  0  . oxyCODONE-acetaminophen (PERCOCET/ROXICET) 5-325 MG per tablet Take 1 tablet by mouth every 4  (four) hours as needed for severe pain.       . pantoprazole (PROTONIX) 40 MG tablet Take 1 tablet (40 mg total) by mouth daily. For stomach irritation.  30 tablet  1  . prochlorperazine (COMPAZINE) 10 MG tablet Take 1 tablet (10 mg total) by mouth every 6 (six) hours as needed (Nausea or vomiting).  30 tablet  1  . promethazine (PHENERGAN) 25 MG tablet Take 1 tablet (25 mg total) by mouth every 6 (six) hours as needed for nausea or vomiting.  30 tablet  0  . PRESCRIPTION MEDICATION She receives her chemo treatments at the [facility]. Her oncologist is Dr. Humphrey Rolls. She received her last chemo treatments of Taxotere 150mg  Injection, Carboplatin 840mg , Herceptin 525mg  and Perjeta 420mg  on 04/08/14. She also received Neulasta 6mg  Injection on 04/09/14.       Scheduled:  Scheduled:  . carvedilol  3.125 mg Oral BID WC  . feeding supplement (ENSURE COMPLETE)  237 mL Oral BID BM  . ferrous sulfate  325 mg Oral TID WC  . nafcillin IV  2 g Intravenous 6 times per day  . pantoprazole  40 mg Oral Daily  . vitamin  C  500 mg Oral TID   Infusion[s]: Infusions:  . sodium chloride    . sodium chloride 1,000 mL (05/19/14 2104)  . sodium chloride 100 mL/hr at 05/23/14 0315  . heparin 1,300 Units/hr (05/22/14 2129)   Antibiotic[s]: Anti-infectives   Start     Dose/Rate Route Frequency Ordered Stop   05/21/14 1600  nafcillin 2 g in dextrose 5 % 50 mL IVPB     2 g 100 mL/hr over 30 Minutes Intravenous 6 times per day 05/21/14 1326        Assessment :  Heparin infusing at 1300 units/hr.  Heparin level is SUB-therapeutic < 0.1 units/ml.  No evidence of bleeding complications observed.  Goal :  Heparin level 0.3-0.7 units/ml.  Plan : 1. Due to concern for bleeding/hematoma of the right chest wall under the surgical site, No bolus will be given. 2. Heparin will be increased to 1600 units/hr.   The next Heparin Level will be drawn in 6 hours 3. Daily Heparin level, CBC while on  Heparin.  Monitor for bleeding complications. Follow Platelet counts.  Stramoski, Craig Guess,  Pharm.D  05/23/2014  4:46 AM

## 2014-05-23 NOTE — Progress Notes (Signed)
[facility] TEAM 1 - Stepdown/ICU TEAM Progress Note  Tamara [name] WUJ:[id number] DOB: 1971/09/26 DOA: 05/19/2014 PCP: Angelica Chessman, MD  Admit HPI / Brief Narrative: Tamara [name] is a 43 y.o. 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 She has also been having chest discomfort x 1 week and had a CTA of the Chest performed as an outpatient which was negative for a Pulmonary embolism. She had a temperature to 102 today.    HPI/Subjective: 6/21 neg SOB/CP has eased up, cont. pain around site of Port-A-Cath removal, (-)SOB   Assessment/Plan:  Sepsis/HCAP -Blood cultures x2 positive for Staphylococcus, awaiting sensitivities -Continue IV Vancomycin and Cefepime, and Albuterol nebs, O2 PRN, and IVFs.  -6/20 after speaking with Dr. Evlyn Clines (oncology) on 6/19,Dr. Evlyn Clines (oncology) agree that Port-A-Cath would need to be removed in order to clear sepsis. Patient S./P. Port-A-Cath removal on 6/19 -Continue vancomycin+ nafcillin per infectious disease -6/20 repeat blood culture x2 check for clearance of bacteria; once clear of bacteria will place CBC vs PICC line -MRI L-spine negative for abscess -Transesophageal Echocardiogram rule out vegetation Pending  DVT right internal jugular and subclavian veins. -Counseled patient would start her on heparin at 2000 today without bolus -Monitor closely for bleeding/hematoma right chest wall under surgical site  Sinus Tachycardia -Resolved -Continue Coreg 3.125 mg  BID    AKI -Continue normal saline 100 ml/hr, patient's BUN/Cr. Normalized  HTN -Slightly above AHA guidelines however will continue Coreg 3.125m  BID  Iron deficiency Anemia/acute blood loss anemia vs  Destruction 2dary Chemo? -6/18 transfuse 2 unit PRBC Occult Blood  -Anemia workup; appears patient has mixed anemia  -Patient's baseline hemoglobin approximately 10 last taken on 6/9. currently at 8.7 stable since transfusion - Continue Iron Rx. 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  -6/19 S./P. removal of Port-A-Cath -  Counseled patient and husband most likely oncology would not restart chemotherapy until infection was resolved. -Counseled that after obtaining blood cultures clear organisms would place a CVC or PICC line in order to complete antibiotics and facilitate chemotherapy.  Neck/right shoulder pain -Positive for DVT -See DVT Right IJ/subclavian  L-spine pain -MRI rule out abscess;Normal MRI of the lumbar spine.    Code Status: FULL Family Communication: Husband present  Disposition Plan: Resolution sepsis;    Consultants: Dr. LEvlyn Clines(oncology), Dr. CCarlyle Basques(infectious disease) Dr. EGreer Pickerel(surgery)   Procedure/Significant Events: 6/17 CXR;Minimal subsegmental atelectasis and/or infiltrate right lung bases;noted recent CT 05/11/2014.    6/19 S./P. Port-A-Cath removal secondary to sepsis 6/19 Doppler right upper extremity;positive for deep vein thrombosis involving the right internal jugular and subclavian veins.     Culture 6/17 BLOOD ARM LEFT x 2; POSITIVE GRAM  COCCI IN CLUSTERS, MSSA  6/17 Urine negative (final)   6/19 blood Port-A-Cath NTD  6/20 blood pending   Antibiotics: Cefepime 6/18>> stopped 6/19 Vacomycin 6/18>>  Nafcillin 6/19>>  DVT prophylaxis: Lovenox   Devices Right chest wall Port-A-Cath   LINES / TUBES:  6/17 22 ga right hand 6/17 ga left antecubital    Continuous Infusions: . sodium chloride    . sodium  chloride 1,000 mL (05/19/14 2104)  . sodium chloride 100 mL/hr at 05/23/14 0315   . heparin 1,600 Units/hr (05/23/14 0828)    Objective: VITAL SIGNS: Temp: 99.2 F (37.3 C) (06/21 1154) Temp src: Oral (06/21 1154) BP: 140/79 mmHg (06/21 1154) Pulse Rate: 91 (06/21 1154) SPO2;96 % on RA FIO2:   Intake/Output Summary (Last 24 hours) at 05/23/14 1237 Last data filed at 05/23/14 0950  Gross per 24 hour  Intake 2833.61 ml  Output      0 ml  Net 2833.61 ml     Exam: General: Sleepy, difficult to arouse (husband states patient had a rough night), follows commands , NAD, No acute respiratory distress, continued tenderness to palpation just superior to right collar bone extending to just under right ear, and right chest wall around the incision site where the Port-A-Cath was removed . Lungs: Clear to auscultation bilaterally mild bibasilar crackles  Cardiovascular: Regular rate and rhythm without murmur gallop or rub normal S1 and S2, Abdomen: Nontender, nondistended, soft, bowel sounds positive, no rebound, no ascites, no appreciable mass Extremities: No significant cyanosis, clubbing, or edema bilateral lower extremities  Data Reviewed: Basic Metabolic Panel:  Recent Labs Lab 05/19/14 1858 05/20/14 0359 05/21/14 0546 05/22/14 0322 05/23/14 0330  NA 138 137 139 140 143  K 4.0 3.6* 3.6* 3.6* 3.7  CL 95* 103 104 103 105  CO2 24 17* 19 20 24   GLUCOSE 112* 144* 102* 102* 104*  BUN 14 12 7  5* 4*  CREATININE 1.40* 1.11* 0.81 0.89 0.88  CALCIUM 9.7 8.0* 8.7 8.4 8.7  MG  --   --  2.2 1.9 1.8   Liver Function Tests:  Recent Labs Lab 05/19/14 1858 05/21/14 0546 05/22/14 0322 05/23/14 0330  AST 30 33 16 23  ALT 84* 66* 45* 41*  ALKPHOS 166* 171* 114 105  BILITOT 0.8 0.4 0.6 0.6  PROT 7.7 6.3 5.9* 6.3  ALBUMIN 3.2* 2.4* 2.1* 2.3*    Recent Labs Lab 05/19/14 1858  LIPASE 39   No results found for this basename: AMMONIA,  in the last 168 hours CBC:  Recent Labs Lab 05/19/14 1858 05/20/14 0359 05/20/14 0947 05/21/14 0812 05/22/14 0322  05/23/14 0330  WBC 28.3* 22.6*  --  16.0* 13.9* 11.7*  NEUTROABS 24.9*  --   --  13.3* 10.4* 8.5*  HGB 8.8* 6.6* 6.8* 8.7* 8.1* 8.7*  HCT 26.4* 20.1* 20.2* 25.6* 24.4* 26.4*  MCV 92.3 92.6  --  89.8 92.1 93.0  PLT 257 188  --  180 197 259   Cardiac Enzymes: No results found for this basename: CKTOTAL, CKMB, CKMBINDEX, TROPONINI,  in the last 168 hours BNP (last 3 results)  Recent Labs  05/11/14 1318  PROBNP 173.9*   CBG: No results found for this basename: GLUCAP,  in the last 168 hours  Recent Results (from the past 240 hour(s))  CULTURE, BLOOD (ROUTINE X 2)     Status: None   Collection Time    05/19/14  6:55 PM      Result Value Ref Range Status   Specimen Description BLOOD ARM LEFT   Final   Special Requests BOTTLES DRAWN AEROBIC AND ANAEROBIC 5CC   Final   Culture  Setup Time     Final   Value: 05/20/2014 01:19     Performed at [facility]   Culture     Final   Value: STAPHYLOCOCCUS AUREUS     Note: RIFAMPIN AND GENTAMICIN SHOULD NOT BE USED  AS SINGLE DRUGS FOR TREATMENT OF STAPH INFECTIONS. This organism is presumed to be Clindamycin resistant based on detection of inducible Clindamycin resistance.     Note: Gram Stain Report Called to,Read Back By and Verified With: DARA A@1 :15PM ON 05/20/14 BY DANTS     Performed at [facility]   Report Status 05/22/2014 FINAL   Final   Organism ID, Bacteria STAPHYLOCOCCUS AUREUS   Final  CULTURE, BLOOD (ROUTINE X 2)     Status: None   Collection Time    05/19/14  7:27 PM      Result Value Ref Range Status   Specimen Description BLOOD ARM LEFT   Final   Special Requests BOTTLES DRAWN AEROBIC AND ANAEROBIC 10CC   Final   Culture  Setup Time     Final   Value: 05/20/2014 01:19     Performed at [facility]   Culture     Final   Value: STAPHYLOCOCCUS AUREUS     Note: SUSCEPTIBILITIES PERFORMED ON PREVIOUS CULTURE WITHIN THE LAST 5 DAYS.     Note: Gram Stain Report Called to,Read Back By and Verified  With: DARA A@1 :15PM ON 05/20/14 BY DANTS     Performed at [facility]   Report Status 05/22/2014 FINAL   Final  URINE CULTURE     Status: None   Collection Time    05/19/14  8:01 PM      Result Value Ref Range Status   Specimen Description URINE, RANDOM   Final   Special Requests NONE   Final   Culture  Setup Time     Final   Value: 05/19/2014 20:34     Performed at [facility]     Final   Value: NO GROWTH     Performed at [facility]   Culture     Final   Value: NO GROWTH     Performed at [facility]   Report Status 05/20/2014 FINAL   Final  MRSA PCR SCREENING     Status: None   Collection Time    05/19/14  9:51 PM      Result Value Ref Range Status   MRSA by PCR NEGATIVE  NEGATIVE Final   Comment:            The GeneXpert MRSA Assay (FDA     approved for NASAL specimens     only), is one component of a     comprehensive MRSA colonization     surveillance program. It is not     intended to diagnose MRSA     infection nor to guide or     monitor treatment for     MRSA infections.  CULTURE, ROUTINE-ABSCESS     Status: None   Collection Time    05/21/14  7:40 PM      Result Value Ref Range Status   Specimen Description ABSCESS PORTA CATH EXIT SITE   Final   Special Requests POF VANC MAXIPINE NAFICILLIN   Final   Gram Stain     Final   Value: RARE WBC PRESENT, PREDOMINANTLY PMN     NO ORGANISMS SEEN     Performed at [facility]   Culture     Final   Value: Culture reincubated for better growth     Performed at [facility]   Report Status PENDING   Incomplete  ANAEROBIC CULTURE     Status: None  Collection Time    05/21/14  7:40 PM      Result Value Ref Range Status   Specimen Description ABSCESS PORTA CATH EXIT SITE   Final   Special Requests POF VANC MAXIPINE NAFICILLIN   Final   Gram Stain     Final   Value: RARE WBC PRESENT, PREDOMINANTLY PMN     NO ORGANISMS SEEN     Performed at [facility]   Culture     Final   Value: NO ANAEROBES ISOLATED; CULTURE IN PROGRESS FOR 5 DAYS     Performed at [facility]   Report Status PENDING   Incomplete  CULTURE, BLOOD (ROUTINE X 2)     Status: None   Collection Time    05/22/14  1:00 PM      Result Value Ref Range Status   Specimen Description BLOOD LEFT FOREARM   Final   Special Requests BOTTLES DRAWN AEROBIC AND ANAEROBIC 5CC   Final   Culture  Setup Time     Final   Value: 05/22/2014 18:54     Performed at [facility]   Culture     Final   Value:        BLOOD CULTURE RECEIVED NO GROWTH TO DATE CULTURE WILL BE HELD FOR 5 DAYS BEFORE ISSUING A FINAL NEGATIVE REPORT     Performed at [facility]   Report Status PENDING   Incomplete  CULTURE, BLOOD (ROUTINE X 2)     Status: None   Collection Time    05/22/14  2:09 PM      Result Value Ref Range Status   Specimen Description BLOOD RIGHT ARM   Final   Special Requests BOTTLES DRAWN AEROBIC AND ANAEROBIC 10CC   Final   Culture  Setup Time     Final   Value: 05/22/2014 18:54     Performed at [facility]   Culture     Final   Value:        BLOOD CULTURE RECEIVED NO GROWTH TO DATE CULTURE WILL BE HELD FOR 5 DAYS BEFORE ISSUING A FINAL NEGATIVE REPORT     Performed at [facility]   Report Status PENDING   Incomplete  CLOSTRIDIUM DIFFICILE BY PCR     Status: None   Collection Time    05/22/14  2:14 PM      Result Value Ref Range Status   C difficile by pcr NEGATIVE  NEGATIVE Final     Studies:  Recent x-ray studies have been reviewed in detail by the Attending Physician  Scheduled Meds:  Scheduled Meds: . carvedilol  3.125 mg Oral BID WC  . feeding supplement (ENSURE COMPLETE)  237 mL Oral BID BM  . ferrous sulfate  325 mg Oral TID WC  . nafcillin IV  2 g Intravenous 6 times per day  . pantoprazole  40 mg Oral Daily  . vitamin C  500 mg Oral TID    Time spent on care of this patient: 40 mins   Allie Bossier , MD    [facility] Office  [phone] Pager [phone]  On-Call/Text Page:      [email or web address]      password [email or web address]  If 7PM-7AM, please contact night-coverage [email or web address] Password [facility] 05/23/2014, 12:37 PM   LOS: 4 days

## 2014-05-23 NOTE — Progress Notes (Signed)
[place] for heparin Indication: DVT  No Known Allergies  Patient Measurements: Height: 5' 5"  (165.1 cm) Weight: 207 lb 7.3 oz (94.1 kg) IBW/kg (Calculated) : 57 Heparin Dosing Weight: 78.1 kg  Vital Signs: Temp: 99.2 F (37.3 C) (06/21 1154) Temp src: Oral (06/21 1154) BP: 140/79 mmHg (06/21 1154) Pulse Rate: 91 (06/21 1154)  Labs:  Recent Labs  05/21/14 0546  05/21/14 0812 05/22/14 0322 05/23/14 0330 05/23/14 1110  HGB  --   < > 8.7* 8.1* 8.7*  --   HCT  --   --  25.6* 24.4* 26.4*  --   PLT  --   --  180 197 259  --   HEPARINUNFRC  --   --   --   --  <0.10* <0.10*  CREATININE 0.81  --   --  0.89 0.88  --   < > = values in this interval not displayed.  Estimated Creatinine Clearance: 96.3 ml/min (by C-G formula based on Cr of 0.88).   Medical History: Past Medical History  Diagnosis Date  . Endometriosis   . Hypertension   . Rash     fine rash on abd  . Anemia   . Wears glasses   . 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 Iron deficiency anemia, unspecified 02/25/2014  . BRCA1 positive     c.190T>G (p.Cys64Gly) @ [facility]     Medications:  Scheduled:  . carvedilol  3.125 mg Oral BID WC  . feeding supplement (ENSURE COMPLETE)  237 mL Oral BID BM  . ferrous sulfate  325 mg Oral TID WC  . nafcillin IV  2 g Intravenous 6 times per day  . pantoprazole  40 mg Oral Daily  . vitamin C  500 mg Oral TID   Infusions:  . sodium chloride    . sodium chloride 1,000 mL (05/19/14 2104)  . sodium chloride 100 mL/hr at 05/23/14 0315  . heparin 1,600 Units/hr (05/23/14 4235)    Assessment: 68 yoF undergoing chemotherapy for breast cancer.  Blood cultures were positive for staph bacteremia.  She is to start heparin drip for large DVT burden right subclavian/IJ.  H/H low but stable and plt WNL. HL remains subtherapeutic after rate increase this morning.  No problems with infusion per nurse.  Will increase rate without bolus per  consult.    Goal of Therapy:  Heparin level 0.3-0.7 units/ml Monitor platelets by anticoagulation protocol: Yes   Plan:  Increase Heparin to 1900 units/hr Check 8 hr HL  Daily HL and CBC  Thank you, Vivia Ewing, PharmD Clinical Pharmacist - Resident Pager: [phone] Pharmacy: [phone] 05/23/2014 12:34 PM

## 2014-05-24 DIAGNOSIS — D649 Anemia, unspecified: Secondary | ICD-10-CM

## 2014-05-24 DIAGNOSIS — M545 Low back pain, unspecified: Secondary | ICD-10-CM

## 2014-05-24 DIAGNOSIS — A409 Streptococcal sepsis, unspecified: Secondary | ICD-10-CM

## 2014-05-24 DIAGNOSIS — A4102 Sepsis due to Methicillin resistant Staphylococcus aureus: Secondary | ICD-10-CM

## 2014-05-24 LAB — MAGNESIUM: MAGNESIUM: 1.9 mg/dL (ref 1.5–2.5)

## 2014-05-24 LAB — CBC WITH DIFFERENTIAL/PLATELET
Basophils Absolute: 0 10*3/uL (ref 0.0–0.1)
Basophils Relative: 0 % (ref 0–1)
EOS ABS: 0 10*3/uL (ref 0.0–0.7)
EOS PCT: 0 % (ref 0–5)
HCT: 24 % — ABNORMAL LOW (ref 36.0–46.0)
Hemoglobin: 8 g/dL — ABNORMAL LOW (ref 12.0–15.0)
Lymphocytes Relative: 18 % (ref 12–46)
Lymphs Abs: 2 10*3/uL (ref 0.7–4.0)
MCH: 31.3 pg (ref 26.0–34.0)
MCHC: 33.3 g/dL (ref 30.0–36.0)
MCV: 93.8 fL (ref 78.0–100.0)
MONOS PCT: 12 % (ref 3–12)
Monocytes Absolute: 1.3 10*3/uL — ABNORMAL HIGH (ref 0.1–1.0)
NEUTROS PCT: 70 % (ref 43–77)
Neutro Abs: 7.7 10*3/uL (ref 1.7–7.7)
PLATELETS: 217 10*3/uL (ref 150–400)
RBC: 2.56 MIL/uL — ABNORMAL LOW (ref 3.87–5.11)
RDW: 18.2 % — ABNORMAL HIGH (ref 11.5–15.5)
WBC: 11 10*3/uL — ABNORMAL HIGH (ref 4.0–10.5)

## 2014-05-24 LAB — COMPREHENSIVE METABOLIC PANEL
ALT: 32 U/L (ref 0–35)
AST: 17 U/L (ref 0–37)
Albumin: 2.1 g/dL — ABNORMAL LOW (ref 3.5–5.2)
Alkaline Phosphatase: 98 U/L (ref 39–117)
BILIRUBIN TOTAL: 0.3 mg/dL (ref 0.3–1.2)
BUN: 4 mg/dL — ABNORMAL LOW (ref 6–23)
CHLORIDE: 103 meq/L (ref 96–112)
CO2: 25 mEq/L (ref 19–32)
Calcium: 8.5 mg/dL (ref 8.4–10.5)
Creatinine, Ser: 0.77 mg/dL (ref 0.50–1.10)
GFR calc Af Amer: >90 mL/min (ref >90)
Glucose, Bld: 120 mg/dL — ABNORMAL HIGH (ref 70–99)
Potassium: 3.2 mEq/L — ABNORMAL LOW (ref 3.7–5.3)
SODIUM: 142 meq/L (ref 137–147)
Total Protein: 5.9 g/dL — ABNORMAL LOW (ref 6.0–8.3)

## 2014-05-24 LAB — HEPARIN LEVEL (UNFRACTIONATED)
HEPARIN UNFRACTIONATED: 0.23 [IU]/mL — AB (ref 0.30–0.70)
Heparin Unfractionated: 0.68 IU/mL (ref 0.30–0.70)

## 2014-05-24 MED ORDER — HYDRALAZINE HCL 10 MG PO TABS
10.0000 mg | ORAL_TABLET | Freq: Three times a day (TID) | ORAL | Status: DC
  Administered 2014-05-24 – 2014-05-28 (×11): 10 mg via ORAL
  Filled 2014-05-24 (×14): qty 1

## 2014-05-24 MED ORDER — POTASSIUM CHLORIDE CRYS ER 20 MEQ PO TBCR
40.0000 meq | EXTENDED_RELEASE_TABLET | Freq: Two times a day (BID) | ORAL | Status: AC
  Administered 2014-05-24 – 2014-05-25 (×3): 40 meq via ORAL
  Filled 2014-05-24 (×3): qty 2

## 2014-05-24 MED ORDER — HEPARIN BOLUS VIA INFUSION
1500.0000 [IU] | Freq: Once | INTRAVENOUS | Status: AC
  Administered 2014-05-24: 1500 [IU] via INTRAVENOUS
  Filled 2014-05-24: qty 1500

## 2014-05-24 MED ORDER — SODIUM CHLORIDE 0.9 % IV SOLN
1000.0000 mL | INTRAVENOUS | Status: DC
  Administered 2014-05-27: 250 mL via INTRAVENOUS
  Administered 2014-05-28: 1000 mL via INTRAVENOUS

## 2014-05-24 MED ORDER — FUROSEMIDE 10 MG/ML IJ SOLN
40.0000 mg | Freq: Once | INTRAMUSCULAR | Status: AC
  Administered 2014-05-24: 40 mg via INTRAVENOUS
  Filled 2014-05-24: qty 4

## 2014-05-24 NOTE — Progress Notes (Signed)
PHARMACY NOTE  Pharmacy Consult :  43 y.o. female is currently on Heparin for large subclavien DVT.   Dosing Wt :  78 kg  Hematology :  Recent Labs  05/21/14 0812 05/22/14 0322 05/23/14 0330 05/23/14 1110 05/23/14 2008 05/24/14 0229  HGB 8.7* 8.1* 8.7*  --   --  8.0*  HCT 25.6* 24.4* 26.4*  --   --  24.0*  PLT 180 197 259  --   --  217  HEPARINUNFRC  --   --  <0.10* <0.10* 0.26* 0.23*  CREATININE  --  0.89 0.88  --   --  0.77    Current Medication[s] Include: Infusion[s]: Infusions:  . sodium chloride    . sodium chloride 1,000 mL (05/19/14 2104)  . sodium chloride 100 mL/hr at 05/23/14 1524  . heparin 2,100 Units/hr (05/23/14 2212)    Assessment :  Heparin infusion rate 2100 units/hr.  Heparin level reported as 0.23 units/ml which is Sub-therapeutic.  No evidence of bleeding complications noted   Goal :  Heparin goal is Heparin level 0.3-0.7 units/ml.  Plan : 1. Heparin bolus 1500 units IV now, then increase Heparin infusion to 2400 units/hr.  The next Heparin Level to be checked in 6 hours    2. Daily Heparin level, CBC while on Heparin.  Monitor for bleeding complications. Follow Platelet counts.  Stramoski, Craig Guess,  Pharm.D  05/24/2014  6:10 AM

## 2014-05-24 NOTE — Progress Notes (Signed)
ANTICOAGULATION CONSULT NOTE   Pharmacy Consult for heparin Indication: DVT  No Known Allergies  Patient Measurements: Height: 5\' 5"  (165.1 cm) Weight: 209 lb 10.5 oz (95.1 kg) IBW/kg (Calculated) : 57 Heparin Dosing Weight: 78.1 kg  Vital Signs: Temp: 98.5 F (36.9 C) (06/22 1155) Temp src: Oral (06/22 1155) BP: 169/71 mmHg (06/22 1155) Pulse Rate: 86 (06/22 1155)  Labs:  Recent Labs  05/22/14 0322 05/23/14 0330  05/23/14 2008 05/24/14 0229 05/24/14 1400  HGB 8.1* 8.7*  --   --  8.0*  --   HCT 24.4* 26.4*  --   --  24.0*  --   PLT 197 259  --   --  217  --   HEPARINUNFRC  --  <0.10*  < > 0.26* 0.23* 0.68  CREATININE 0.89 0.88  --   --  0.77  --   < > = values in this interval not displayed.  Estimated Creatinine Clearance: 106.5 ml/min (by C-G formula based on Cr of 0.77).  Infusions:  . sodium chloride    . sodium chloride 1,000 mL (05/19/14 2104)  . sodium chloride 100 mL/hr at 05/23/14 1524  . heparin 2,400 Units/hr (05/24/14 1059)    Assessment: 49 yoF undergoing chemotherapy for breast cancer admitted 05/19/2014 . She continues on IV heparin for new DVT. Heparin level is now therapeutic at 0.68 but did increase drastically and is at the very upper end of goal range. Level may be reflective of bolus dose.   Goal of Therapy:  Heparin level 0.3-0.7 units/ml Monitor platelets by anticoagulation protocol: Yes   Plan:  1. Continue heparin gtt at 2400 units/hr 2. Check a 6 hour heparin level to confirm   Salome Arnt, PharmD, BCPS Pager # [phone] 05/24/2014 2:37 PM

## 2014-05-24 NOTE — Progress Notes (Signed)
05/24/2014, 6:07 PM  Hospital day: 6 Antibiotics: nafcillin Chemotherapy: cycle 4 neoadjuvant carboplatin/taxotere/herceptin/pertuzumab given 05-13-14. Due cycle 5 05-27-14, which we will likely delay at least a week. Heparin qtt per pharmacy  Outpatient Physicians: (K.Khan), T.Cornett, D.Bensimhon, C.Sanger, J.Kinard    EMR reviewed and patient seen now, with husband, mother, and 4 other family present.  Subjective: Feeling some better today. Sat up in bed most of day, but not in chair. Right neck a little less painful today. Has been able to drink 3 Ensure so far today, also grits and oatmeal, tho taste still very minimal since chemo. 3 bowel movements today, loose x2. Minimal back pain now. Still SOB with exertion including talking or walking to BR. Not much discomfort from site of previous PAC.  Objective: Vital signs in last 24 hours: Blood pressure 189/97, pulse 83, temperature 98.3 F (36.8 C), temperature source Oral, resp. rate 18, height 5' 5"  (1.651 m), weight 209 lb 10.5 oz (95.1 kg), last menstrual period 02/19/2014, SpO2 93.00%.   Intake/Output from previous day: 06/21 0701 - 06/22 0700 In: 640 [P.O.:240; I.V.:100; IV Piggyback:300] Out: 350 [Urine:350] Intake/Output this shift: Total I/O In: 1449 [P.O.:360; I.V.:1089] Out: -   Physical exam: Pale, not icteric. Alopecia. PERRL. Oral mucosa moist, no lesions. Lungs clear anteriorly, shallow respirations, no coughing now. Heart RRR no gallop. Can turn head more easily, less tender right neck to gentle palpation. PAC site packed and dressed. Abdomen slightly distended, few bowel sounds, not tender. LE no edema, cords, tenderness. Moves extremities easily. Peripheral IVs infusing. Improved swelling left hand.  Lab Results:  Recent Labs  05/23/14 0330 05/24/14 0229  WBC 11.7* 11.0*  HGB 8.7* 8.0*  HCT 26.4* 24.0*  PLT 259 217   BMET  Recent Labs  05/23/14 0330 05/24/14 0229  NA 143 142  K 3.7 3.2*  CL 105  103  CO2 24 25  GLUCOSE 104* 120*  BUN 4* 4*  CREATININE 0.88 0.77  CALCIUM 8.7 8.5   Follow up blood cultures x 2 from 05-22-14 no growth to date PAC site cultures aerobic and anaerobic no growth to date  Studies/Results: No results found. TEE apparently scheduled for 05-25-14   Upper outer quadrant left breast mass found by patient, with mammogram at [facility] 01-18-14 with 3.2 cm mass @ 2:00, 7 cm from nipple, another 8 mm mass in left breast and right axillary adenopathy. 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 MRI 3-9-15confirmed 2.8 x 2.5x 3.1cm mass + 1.3 x0.8 x 0.8 cm mass superior and posterior to other area as well as lymph nodes up to 2.4 cm in left axilla. CT CAP 02-19-14 and PET 0-10-27 had hypermetabolic left breast masses and left axillary nodes, lungs clear, abdomen not remarkable, FDG activity in endometrial canal without obvious endometrial abnormality on CT, slightly enlarged uterus with fibroids, ovaries normal, no abdominal or pelvic adenopathy, no evidence of skeletal metastasis. Genetics testing documented BRCA 1 +. Echocardiogram 05-05-14 had EF 45-50%; repeat echocardiogram 05-05-14 had EF 50-55%. She began neoadjuvant treatment with carboplatin, taxotere, herceptin, perjeta on 02-25-14, planned q 3 weeks for 6 cycles. PAC (right) was placed by Dr Brantley Stage on 02-23-14. She was seen in ED 03-08-14 for right neck pain, then by surgery 03-09-14 with apparent mild infection at incision for PAC, improved with oral  antibiotics. She had mild erythema at [facility] again 03-25-14, treated with antibiotics thru at least 5-14 or possibly 04-27-14; PAC appeared ready for use by 04-27-14. She was seen in ED on 05-11-14 with central chest pain, CT angio chest no PE and no evidence of cardiac etiology; she was still  uncomfortable but not febrile at [facility] 05-14-14, with new elevation in AST to 59 and ALT to 145. She was progressively more uncomfortable and had fever for several days prior to admission from ED on 05-19-14, (tho had not contacted physicians about that and had not returned phone calls from [facility]).      Assessment/Plan: 1.Methicillin sensitive staph aureus sepsis from [facility] infection: PAC removed 05-21-14, continuing nafcillin, TEE pending. ID following.  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 Due cycle 5 on 05-27-14, which likely will need to delay at least a week depending on status of the staph infection. Plan repeat breast MRI after neoadjuvant chemo completes, then left breast surgery.  3.difficult peripheral venous access but need to continue peripheral IVs until sure bacteremia has cleared. 4.Right IJ and subclavian vein DVT: on heparin by pharmacy. I have written to try warm soaks if that helps local discomfort. Could consider full dose LMW heparin if no access for IV heparin.  5.diarrhea: more BMs today but was C diff negative on 05-22-14 6. Poor po intake: I have encouraged Ensure at least 4 daily if not other good po's 7.anemia: multifactorial. Post transfusion on admission. She had IV iron recently at [facility]. Would give PRBCs if <8 or more symptomatic 8.apparently overdue gyn exam. Note uptake in endometrial canal on PET, possibly related to phase of ovulation.  9.back pain improved, normal LS MRI  Will continue to follow with you. Please call between my rounds if our service can help  LIVESAY,LENNIS P [phone]

## 2014-05-24 NOTE — Progress Notes (Signed)
[place] TEAM 1 - Stepdown/ICU TEAM Progress Note  Tamara [name] MVH:[id number] DOB: 1971/07/19 DOA: 05/19/2014 PCP: Angelica Chessman, MD  Admit HPI / Brief Narrative: 43 y.o. 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 She had also been having chest discomfort x 1 week and had a CTA of the Chest performed as an outpatient which was negative for a pulmonary embolism. She had a temperature to 102.   HPI/Subjective: Pt feels weak in general, and has noted signif swelling in both arms, L > R.  She denies CP, or sob at present.   Assessment/Plan:  Sepsis / MSSA Bacteremia  -Blood cultures x2 positive for Staphylococcus -Continue IV Vancomycin and Cefepime, and Albuterol nebs, O2 PRN, and IVFs.  -6/20 after speaking with Dr. Evlyn Clines (oncology) on 6/19, Dr. Evlyn Clines (Oncology) agree that Port-A-Cath would need to be removed in order to clear sepsis. Patient S./P. Port-A-Cath removal on 6/19 -Continue abx per ID  -6/20 repeat blood culture x2 check for clearance of bacteria; once clear of bacteria will place PICC line -MRI L-spine negative for abscess -Transesophageal Echocardiogram rule out vegetation Pending  DVT right internal jugular and subclavian veins. -heparin continues w/o evidence of bleeding at this time   Sinus Tachycardia -Resolved -Continue Coreg 3.125 mg  BID    AKI -resolved   HTN -BP poorly controlled - adjust med tx and follow - gently diurese   Iron deficiency Anemia/acute blood loss anemia vs  Destruction 2dary Chemo? -6/18 transfused 2 unit PRBC -appears patient has mixed anemia  -Patient's baseline hemoglobin approximately 10 last taken  on 6/9. currently at 8.7 stable since transfusion -Continue Iron Rx  T2N1cM0 ER PR negative, HER2 positive invasive ductal carcinoma of left breast.  -6/19 S./P. removal of Port-A-Cath - Counseled patient and husband most likely Oncology would not restart chemotherapy until infection was resolved. -Counseled that after obtaining blood cultures clear organisms would place a CVC or PICC line in order to complete antibiotics and facilitate chemotherapy.  Neck/right shoulder pain -Positive for DVT -See DVT Right IJ/subclavian  L-spine pain -MRI rule out abscess;Normal MRI of the lumbar spine.  Code Status: FULL Family Communication: Husband present  Disposition Plan: SDU  Consultants: Dr. Evlyn Clines (oncology), Dr. Carlyle Basques (infectious disease) Dr. Greer Pickerel (surgery)  Procedure/Significant Events: 6/17 CXR;Minimal subsegmental atelectasis and/or infiltrate right lung bases;noted recent CT 05/11/2014.    6/19 S./P. Port-A-Cath removal secondary to sepsis 6/19 Doppler right upper extremity;positive for deep vein thrombosis involving the right internal jugular and subclavian veins.  Culture 6/17 BLOOD ARM LEFT x 2; POSITIVE GRAM  COCCI IN CLUSTERS, MSSA  6/17 Urine negative (final)   6/19 blood Port-A-Cath NTD  6/20 blood pending  Antibiotics: Cefepime 6/18>> 6/19 Vacomycin 6/18>> 6/20 Nafcillin 6/19>>  DVT prophylaxis: Heparin   LINES / TUBES:  6/17 22 ga right hand 6/17 ga left antecubital  Objective: VITAL SIGNS: Temp: 98.5 F (36.9 C) (06/22 1155) Temp src: Oral (06/22 1155) BP: 169/71 mmHg (06/22 1155) Pulse Rate: 86 (06/22 1155) SPO2;96 % on RA  Intake/Output Summary (Last 24 hours) at 05/24/14 1437 Last data filed at 05/24/14 1300  Gross per 24 hour  Intake 1729.02 ml  Output  350 ml  Net 1379.02 ml   Exam: General: alert and conversant - no acute resp distress Lungs: Clear to auscultation bilaterally w/o wheeze   Cardiovascular:  Regular rate and rhythm without murmur gallop or rub normal S1 and S2, Abdomen: Nontender, nondistended, soft, bowel sounds positive, no rebound, no ascites, no appreciable mass Extremities: No significant cyanosis, clubbing, or edema bilateral lower extremities - 1+ edema B UE   Data Reviewed: Basic Metabolic Panel:  Recent Labs Lab 05/20/14 0359 05/21/14 0546 05/22/14 0322 05/23/14 0330 05/24/14 0229  NA 137 139 140 143 142  K 3.6* 3.6* 3.6* 3.7 3.2*  CL 103 104 103 105 103  CO2 17* 19 20 24 25   GLUCOSE 144* 102* 102* 104* 120*  BUN 12 7 5* 4* 4*  CREATININE 1.11* 0.81 0.89 0.88 0.77  CALCIUM 8.0* 8.7 8.4 8.7 8.5  MG  --  2.2 1.9 1.8 1.9   Liver Function Tests:  Recent Labs Lab 05/19/14 1858 05/21/14 0546 05/22/14 0322 05/23/14 0330 05/24/14 0229  AST 30 33 16 23 17   ALT 84* 66* 45* 41* 32  ALKPHOS 166* 171* 114 105 98  BILITOT 0.8 0.4 0.6 0.6 0.3  PROT 7.7 6.3 5.9* 6.3 5.9*  ALBUMIN 3.2* 2.4* 2.1* 2.3* 2.1*    Recent Labs Lab 05/19/14 1858  LIPASE 39   CBC:  Recent Labs Lab 05/19/14 1858 05/20/14 0359 05/20/14 0947 05/21/14 0812 05/22/14 0322 05/23/14 0330 05/24/14 0229  WBC 28.3* 22.6*  --  16.0* 13.9* 11.7* 11.0*  NEUTROABS 24.9*  --   --  13.3* 10.4* 8.5* 7.7  HGB 8.8* 6.6* 6.8* 8.7* 8.1* 8.7* 8.0*  HCT 26.4* 20.1* 20.2* 25.6* 24.4* 26.4* 24.0*  MCV 92.3 92.6  --  89.8 92.1 93.0 93.8  PLT 257 188  --  180 197 259 217    Recent Results (from the past 240 hour(s))  CULTURE, BLOOD (ROUTINE X 2)     Status: None   Collection Time    05/19/14  6:55 PM      Result Value Ref Range Status   Specimen Description BLOOD ARM LEFT   Final   Special Requests BOTTLES DRAWN AEROBIC AND ANAEROBIC 5CC   Final   Culture  Setup Time     Final   Value: 05/20/2014 01:19     Performed at [facility]   Culture     Final   Value: STAPHYLOCOCCUS AUREUS     Note: RIFAMPIN AND GENTAMICIN SHOULD NOT BE USED AS SINGLE DRUGS FOR TREATMENT OF STAPH  INFECTIONS. This organism is presumed to be Clindamycin resistant based on detection of inducible Clindamycin resistance.     Note: Gram Stain Report Called to,Read Back By and Verified With: DARA A@1 :15PM ON 05/20/14 BY DANTS     Performed at [facility]   Report Status 05/22/2014 FINAL   Final   Organism ID, Bacteria STAPHYLOCOCCUS AUREUS   Final  CULTURE, BLOOD (ROUTINE X 2)     Status: None   Collection Time    05/19/14  7:27 PM      Result Value Ref Range Status   Specimen Description BLOOD ARM LEFT   Final   Special Requests BOTTLES DRAWN AEROBIC AND ANAEROBIC 10CC   Final   Culture  Setup Time     Final   Value: 05/20/2014 01:19     Performed at [facility]   Culture     Final   Value: STAPHYLOCOCCUS AUREUS  Note: SUSCEPTIBILITIES PERFORMED ON PREVIOUS CULTURE WITHIN THE LAST 5 DAYS.     Note: Gram Stain Report Called to,Read Back By and Verified With: DARA A@1 :15PM ON 05/20/14 BY DANTS     Performed at [facility]   Report Status 05/22/2014 FINAL   Final  URINE CULTURE     Status: None   Collection Time    05/19/14  8:01 PM      Result Value Ref Range Status   Specimen Description URINE, RANDOM   Final   Special Requests NONE   Final   Culture  Setup Time     Final   Value: 05/19/2014 20:34     Performed at [facility]     Final   Value: NO GROWTH     Performed at [facility]   Culture     Final   Value: NO GROWTH     Performed at [facility]   Report Status 05/20/2014 FINAL   Final  MRSA PCR SCREENING     Status: None   Collection Time    05/19/14  9:51 PM      Result Value Ref Range Status   MRSA by PCR NEGATIVE  NEGATIVE Final   Comment:            The GeneXpert MRSA Assay (FDA     approved for NASAL specimens     only), is one component of a     comprehensive MRSA colonization     surveillance program. It is not     intended to diagnose MRSA     infection nor to guide or     monitor  treatment for     MRSA infections.  CULTURE, ROUTINE-ABSCESS     Status: None   Collection Time    05/21/14  7:40 PM      Result Value Ref Range Status   Specimen Description ABSCESS PORTA CATH EXIT SITE   Final   Special Requests POF VANC MAXIPINE NAFICILLIN   Final   Gram Stain     Final   Value: RARE WBC PRESENT, PREDOMINANTLY PMN     NO ORGANISMS SEEN     Performed at [facility]   Culture     Final   Value: Culture reincubated for better growth     Performed at [facility]   Report Status PENDING   Incomplete  ANAEROBIC CULTURE     Status: None   Collection Time    05/21/14  7:40 PM      Result Value Ref Range Status   Specimen Description ABSCESS PORTA CATH EXIT SITE   Final   Special Requests POF VANC MAXIPINE NAFICILLIN   Final   Gram Stain     Final   Value: RARE WBC PRESENT, PREDOMINANTLY PMN     NO ORGANISMS SEEN     Performed at [facility]     Final   Value: NO ANAEROBES ISOLATED; CULTURE IN PROGRESS FOR 5 DAYS     Performed at [facility]   Report Status PENDING   Incomplete  CULTURE, BLOOD (ROUTINE X 2)     Status: None   Collection Time    05/22/14  1:00 PM      Result Value Ref Range Status   Specimen Description BLOOD LEFT FOREARM   Final   Special Requests BOTTLES DRAWN AEROBIC AND ANAEROBIC 5CC   Final  Culture  Setup Time     Final   Value: 05/22/2014 18:54     Performed at [facility]   Culture     Final   Value:        BLOOD CULTURE RECEIVED NO GROWTH TO DATE CULTURE WILL BE HELD FOR 5 DAYS BEFORE ISSUING A FINAL NEGATIVE REPORT     Performed at [facility]   Report Status PENDING   Incomplete  CULTURE, BLOOD (ROUTINE X 2)     Status: None   Collection Time    05/22/14  2:09 PM      Result Value Ref Range Status   Specimen Description BLOOD RIGHT ARM   Final   Special Requests BOTTLES DRAWN AEROBIC AND ANAEROBIC 10CC   Final   Culture  Setup Time     Final   Value: 05/22/2014  18:54     Performed at [facility]   Culture     Final   Value:        BLOOD CULTURE RECEIVED NO GROWTH TO DATE CULTURE WILL BE HELD FOR 5 DAYS BEFORE ISSUING A FINAL NEGATIVE REPORT     Performed at [facility]   Report Status PENDING   Incomplete  CLOSTRIDIUM DIFFICILE BY PCR     Status: None   Collection Time    05/22/14  2:14 PM      Result Value Ref Range Status   C difficile by pcr NEGATIVE  NEGATIVE Final     Studies:  Recent x-ray studies have been reviewed in detail by the Attending Physician  Scheduled Meds:  Scheduled Meds: . carvedilol  3.125 mg Oral BID WC  . feeding supplement (ENSURE COMPLETE)  237 mL Oral BID BM  . ferrous sulfate  325 mg Oral TID WC  . nafcillin IV  2 g Intravenous 6 times per day  . pantoprazole  40 mg Oral Daily  . vitamin C  500 mg Oral TID    Time spent on care of this patient: 35 mins  Cherene Altes, MD [facility] For Consults/Admissions - Flow Manager - [phone] Office  [phone] Pager [phone]  On-Call/Text Page:      [email or web address]      password [facility]  05/24/2014, 2:37 PM   LOS: 5 days

## 2014-05-24 NOTE — Progress Notes (Signed)
Patient ID: Tamara [name], female   DOB: 11-06-1971, 43 y.o.   MRN: [id number]  Subjective: Not much of an appetite, taking in ensure.  Low grade temp yesterday evening.  WBC trending down.  Objective:  Vital signs:  Filed Vitals:   05/23/14 2317 05/24/14 0010 05/24/14 0419 05/24/14 0800  BP:  166/70 166/62 174/94  Pulse: 86 93 91 85  Temp:  99.5 F (37.5 C) 99 F (37.2 C) 97.9 F (36.6 C)  TempSrc:  Oral Oral Oral  Resp:    18  Height:      Weight:   209 lb 10.5 oz (95.1 kg)   SpO2: 96% 97% 91% 96%    Last BM Date: 05/20/14  Intake/Output   Yesterday:  06/21 0701 - 06/22 0700 In: 540 [P.O.:240; IV Piggyback:300] Out: 350 [Urine:350] This shift:  Total I/O In: 729 [P.O.:240; I.V.:489] Out: -   Physical Exam: General: Pt awake/alert/oriented x4 in no acute distress Skin: right subclavian area wound-small open wound, minimal drainage, wound replaced.    Problem List:   Principal Problem:   Sepsis Active Problems:   Breast cancer of upper-outer quadrant of left female breast   HTN (hypertension)   Iron deficiency anemia, unspecified   HCAP (healthcare-associated pneumonia)   Sinus tachycardia   AKI (acute kidney injury)   Acute blood loss anemia   Staphylococcal sepsis   DVT (deep venous thrombosis) right IJ/subclavian    Results:   Labs: Results for orders placed during the hospital encounter of 05/19/14 (from the past 48 hour(s))  CULTURE, BLOOD (ROUTINE X 2)     Status: None   Collection Time    05/22/14  1:00 PM      Result Value Ref Range   Specimen Description BLOOD LEFT FOREARM     Special Requests BOTTLES DRAWN AEROBIC AND ANAEROBIC 5CC     Culture  Setup Time       Value: 05/22/2014 18:54     Performed at [facility]   Culture       Value:        BLOOD CULTURE RECEIVED NO GROWTH TO DATE CULTURE WILL BE HELD FOR 5 DAYS BEFORE ISSUING A FINAL NEGATIVE REPORT     Performed at [facility]   Report Status PENDING    CULTURE,  BLOOD (ROUTINE X 2)     Status: None   Collection Time    05/22/14  2:09 PM      Result Value Ref Range   Specimen Description BLOOD RIGHT ARM     Special Requests BOTTLES DRAWN AEROBIC AND ANAEROBIC 10CC     Culture  Setup Time       Value: 05/22/2014 18:54     Performed at [facility]   Culture       Value:        BLOOD CULTURE RECEIVED NO GROWTH TO DATE CULTURE WILL BE HELD FOR 5 DAYS BEFORE ISSUING A FINAL NEGATIVE REPORT     Performed at [facility]   Report Status PENDING    CLOSTRIDIUM DIFFICILE BY PCR     Status: None   Collection Time    05/22/14  2:14 PM      Result Value Ref Range   C difficile by pcr NEGATIVE  NEGATIVE  HEPARIN LEVEL (UNFRACTIONATED)     Status: Abnormal   Collection Time    05/23/14  3:30 AM      Result Value Ref Range   Heparin Unfractionated <  0.10 (*) 0.30 - 0.70 IU/mL   Comment:            IF HEPARIN RESULTS ARE BELOW     EXPECTED VALUES, AND PATIENT     DOSAGE HAS BEEN CONFIRMED,     SUGGEST FOLLOW UP TESTING     OF ANTITHROMBIN III LEVELS.     REPEATED TO VERIFY  COMPREHENSIVE METABOLIC PANEL     Status: Abnormal   Collection Time    05/23/14  3:30 AM      Result Value Ref Range   Sodium 143  137 - 147 mEq/L   Potassium 3.7  3.7 - 5.3 mEq/L   Chloride 105  96 - 112 mEq/L   CO2 24  19 - 32 mEq/L   Glucose, Bld 104 (*) 70 - 99 mg/dL   BUN 4 (*) 6 - 23 mg/dL   Creatinine, Ser 0.88  0.50 - 1.10 mg/dL   Calcium 8.7  8.4 - 10.5 mg/dL   Total Protein 6.3  6.0 - 8.3 g/dL   Albumin 2.3 (*) 3.5 - 5.2 g/dL   AST 23  0 - 37 U/L   ALT 41 (*) 0 - 35 U/L   Alkaline Phosphatase 105  39 - 117 U/L   Total Bilirubin 0.6  0.3 - 1.2 mg/dL   GFR calc non Af Amer 81 (*) >90 mL/min   GFR calc Af Amer >90  >90 mL/min   Comment: (NOTE)     The eGFR has been calculated using the CKD EPI equation.     This calculation has not been validated in all clinical situations.     eGFR's persistently <90 mL/min signify possible Chronic Kidney      Disease.  CBC WITH DIFFERENTIAL     Status: Abnormal   Collection Time    05/23/14  3:30 AM      Result Value Ref Range   WBC 11.7 (*) 4.0 - 10.5 K/uL   RBC 2.84 (*) 3.87 - 5.11 MIL/uL   Hemoglobin 8.7 (*) 12.0 - 15.0 g/dL   HCT 26.4 (*) 36.0 - 46.0 %   MCV 93.0  78.0 - 100.0 fL   MCH 30.6  26.0 - 34.0 pg   MCHC 33.0  30.0 - 36.0 g/dL   RDW 18.9 (*) 11.5 - 15.5 %   Platelets 259  150 - 400 K/uL   Comment: DELTA CHECK NOTED   Neutrophils Relative % 72  43 - 77 %   Neutro Abs 8.5 (*) 1.7 - 7.7 K/uL   Lymphocytes Relative 19  12 - 46 %   Lymphs Abs 2.2  0.7 - 4.0 K/uL   Monocytes Relative 9  3 - 12 %   Monocytes Absolute 1.1 (*) 0.1 - 1.0 K/uL   Eosinophils Relative 0  0 - 5 %   Eosinophils Absolute 0.0  0.0 - 0.7 K/uL   Basophils Relative 0  0 - 1 %   Basophils Absolute 0.0  0.0 - 0.1 K/uL  MAGNESIUM     Status: None   Collection Time    05/23/14  3:30 AM      Result Value Ref Range   Magnesium 1.8  1.5 - 2.5 mg/dL  HEPARIN LEVEL (UNFRACTIONATED)     Status: Abnormal   Collection Time    05/23/14 11:10 AM      Result Value Ref Range   Heparin Unfractionated <0.10 (*) 0.30 - 0.70 IU/mL   Comment:  IF HEPARIN RESULTS ARE BELOW     EXPECTED VALUES, AND PATIENT     DOSAGE HAS BEEN CONFIRMED,     SUGGEST FOLLOW UP TESTING     OF ANTITHROMBIN III LEVELS.  HEPARIN LEVEL (UNFRACTIONATED)     Status: Abnormal   Collection Time    05/23/14  8:08 PM      Result Value Ref Range   Heparin Unfractionated 0.26 (*) 0.30 - 0.70 IU/mL   Comment:            IF HEPARIN RESULTS ARE BELOW     EXPECTED VALUES, AND PATIENT     DOSAGE HAS BEEN CONFIRMED,     SUGGEST FOLLOW UP TESTING     OF ANTITHROMBIN III LEVELS.  COMPREHENSIVE METABOLIC PANEL     Status: Abnormal   Collection Time    05/24/14  2:29 AM      Result Value Ref Range   Sodium 142  137 - 147 mEq/L   Potassium 3.2 (*) 3.7 - 5.3 mEq/L   Chloride 103  96 - 112 mEq/L   CO2 25  19 - 32 mEq/L   Glucose, Bld 120 (*)  70 - 99 mg/dL   BUN 4 (*) 6 - 23 mg/dL   Creatinine, Ser 0.77  0.50 - 1.10 mg/dL   Calcium 8.5  8.4 - 10.5 mg/dL   Total Protein 5.9 (*) 6.0 - 8.3 g/dL   Albumin 2.1 (*) 3.5 - 5.2 g/dL   AST 17  0 - 37 U/L   ALT 32  0 - 35 U/L   Alkaline Phosphatase 98  39 - 117 U/L   Total Bilirubin 0.3  0.3 - 1.2 mg/dL   GFR calc non Af Amer >90  >90 mL/min   GFR calc Af Amer >90  >90 mL/min   Comment: (NOTE)     The eGFR has been calculated using the CKD EPI equation.     This calculation has not been validated in all clinical situations.     eGFR's persistently <90 mL/min signify possible Chronic Kidney     Disease.  CBC WITH DIFFERENTIAL     Status: Abnormal   Collection Time    05/24/14  2:29 AM      Result Value Ref Range   WBC 11.0 (*) 4.0 - 10.5 K/uL   RBC 2.56 (*) 3.87 - 5.11 MIL/uL   Hemoglobin 8.0 (*) 12.0 - 15.0 g/dL   HCT 24.0 (*) 36.0 - 46.0 %   MCV 93.8  78.0 - 100.0 fL   MCH 31.3  26.0 - 34.0 pg   MCHC 33.3  30.0 - 36.0 g/dL   RDW 18.2 (*) 11.5 - 15.5 %   Platelets 217  150 - 400 K/uL   Neutrophils Relative % 70  43 - 77 %   Neutro Abs 7.7  1.7 - 7.7 K/uL   Lymphocytes Relative 18  12 - 46 %   Lymphs Abs 2.0  0.7 - 4.0 K/uL   Monocytes Relative 12  3 - 12 %   Monocytes Absolute 1.3 (*) 0.1 - 1.0 K/uL   Eosinophils Relative 0  0 - 5 %   Eosinophils Absolute 0.0  0.0 - 0.7 K/uL   Basophils Relative 0  0 - 1 %   Basophils Absolute 0.0  0.0 - 0.1 K/uL  MAGNESIUM     Status: None   Collection Time    05/24/14  2:29 AM      Result Value Ref Range  Magnesium 1.9  1.5 - 2.5 mg/dL  HEPARIN LEVEL (UNFRACTIONATED)     Status: Abnormal   Collection Time    05/24/14  2:29 AM      Result Value Ref Range   Heparin Unfractionated 0.23 (*) 0.30 - 0.70 IU/mL   Comment:            IF HEPARIN RESULTS ARE BELOW     EXPECTED VALUES, AND PATIENT     DOSAGE HAS BEEN CONFIRMED,     SUGGEST FOLLOW UP TESTING     OF ANTITHROMBIN III LEVELS.     Performed at [facility]    Imaging / Studies: Mr Lumbar Spine W Wo Contrast  05/22/2014   CLINICAL DATA:  Breast cancer. Staph bacteremia. Infected Port-A-Cath removed May 21, 2014. New onset lumbar spine pain.  EXAM: MRI LUMBAR SPINE WITHOUT AND WITH CONTRAST  TECHNIQUE: Multiplanar and multiecho pulse sequences of the lumbar spine were obtained without and with intravenous contrast.  CONTRAST:  43m MULTIHANCE GADOBENATE DIMEGLUMINE 529 MG/ML IV SOLN  COMPARISON:  None.  FINDINGS: The vertebral bodies of the lumbar spine are normal in size and alignment. The marrow is diffusely low in signal likely secondary to patient receiving chemotherapy. The intervertebral disc spaces are well-maintained. There are no areas of abnormal enhancement.  The spinal cord is normal in signal and contour. The cord terminates normally at L1 . The nerve roots of the cauda equina and the filum terminale are normal. There is no epidural fluid collection.  The visualized portions of the SI joints are unremarkable.  The imaged intra-abdominal contents are unremarkable.  T12-L1: No significant disc bulge. No evidence of neural foraminal stenosis. No central canal stenosis.  L1-L2: No significant disc bulge. No evidence of neural foraminal stenosis. No central canal stenosis.  L2-L3: No significant disc bulge. No evidence of neural foraminal stenosis. No central canal stenosis.  L3-L4: No significant disc bulge. No evidence of neural foraminal stenosis. No central canal stenosis.  L4-L5: No significant disc bulge. No evidence of neural foraminal stenosis. No central canal stenosis.  L5-S1: No significant disc bulge. No evidence of neural foraminal stenosis. No central canal stenosis.  IMPRESSION: 1. Normal MRI of the lumbar spine.   Electronically Signed   By: HKathreen Devoid  On: 05/22/2014 12:48    Scheduled Meds: . carvedilol  3.125 mg Oral BID WC  . feeding supplement (ENSURE COMPLETE)  237 mL Oral BID BM  . ferrous sulfate  325 mg Oral TID  WC  . nafcillin IV  2 g Intravenous 6 times per day  . pantoprazole  40 mg Oral Daily  . vitamin C  500 mg Oral TID   Continuous Infusions: . sodium chloride    . sodium chloride 1,000 mL (05/19/14 2104)  . sodium chloride 100 mL/hr at 05/23/14 1524  . heparin 2,400 Units/hr (05/24/14 0648)   PRN Meds:.acetaminophen, acetaminophen, alum & mag hydroxide-simeth, heparin lock flush, HYDROmorphone (DILAUDID) injection, loratadine, LORazepam, ondansetron (ZOFRAN) IV, ondansetron, oxyCODONE, sodium chloride   Antibiotics: Anti-infectives   Start     Dose/Rate Route Frequency Ordered Stop   05/21/14 2230  vancomycin (VANCOCIN) 1,250 mg in sodium chloride 0.9 % 250 mL IVPB  Status:  Discontinued     1,250 mg 166.7 mL/hr over 90 Minutes Intravenous Every 8 hours 05/21/14 2211 05/22/14 1359   05/21/14 1600  nafcillin 2 g in dextrose 5 % 50 mL IVPB     2 g 100 mL/hr  over 30 Minutes Intravenous 6 times per day 05/21/14 1326     05/21/14 1300  nafcillin injection 2 g  Status:  Discontinued     2 g Intravenous Every 6 hours 05/21/14 1227 05/21/14 1230   05/21/14 1300  nafcillin 2 g in dextrose 5 % 50 mL IVPB  Status:  Discontinued     2 g 100 mL/hr over 30 Minutes Intravenous 4 times per day 05/21/14 1230 05/21/14 1326   05/21/14 1230  nafcillin injection 2 g  Status:  Discontinued     2 g Intravenous Every 6 hours 05/21/14 1225 05/21/14 1226   05/20/14 0600  ceFEPIme (MAXIPIME) 1 g in dextrose 5 % 50 mL IVPB  Status:  Discontinued     1 g 100 mL/hr over 30 Minutes Intravenous 3 times per day 05/19/14 1912 05/21/14 1326   05/20/14 0400  vancomycin (VANCOCIN) IVPB 1000 mg/200 mL premix  Status:  Discontinued     1,000 mg 200 mL/hr over 60 Minutes Intravenous Every 8 hours 05/19/14 1912 05/21/14 2211   05/19/14 1915  ceFEPIme (MAXIPIME) 2 g in dextrose 5 % 50 mL IVPB     2 g 100 mL/hr over 30 Minutes Intravenous  Once 05/19/14 1904 05/19/14 2108   05/19/14 1915  vancomycin (VANCOCIN) IVPB 1000  mg/200 mL premix     1,000 mg 200 mL/hr over 60 Minutes Intravenous  Once 05/19/14 1904 05/19/14 2108      Assessment/Plan Internal jugular, 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

## 2014-05-24 NOTE — Progress Notes (Signed)
Agree with above 

## 2014-05-24 NOTE — Progress Notes (Signed)
[facility] for Infectious Disease    Date of Admission:  05/19/2014   Total days of antibiotics 6        Day 4 nafcillin        ID: Kaelea Gathright is a 43 y.o. female with breast cancer undergoing adjuvent chemo just finished 4th cycle presents with fever, found to have MSSA bacteremia Principal Problem:   Sepsis Active Problems:   Breast cancer of upper-outer quadrant of left female breast   HTN (hypertension)   Iron deficiency anemia, unspecified   HCAP (healthcare-associated pneumonia)   Sinus tachycardia   AKI (acute kidney injury)   Acute blood loss anemia   Staphylococcal sepsis   DVT (deep venous thrombosis) right IJ/subclavian    Subjective: Febrile to 100.5 tmax yesterday. Friends visiting, poor appetite still. Underwent spine MRI which was negative  Medications:  . carvedilol  3.125 mg Oral BID WC  . feeding supplement (ENSURE COMPLETE)  237 mL Oral BID BM  . ferrous sulfate  325 mg Oral TID WC  . nafcillin IV  2 g Intravenous 6 times per day  . pantoprazole  40 mg Oral Daily  . vitamin C  500 mg Oral TID    Objective: Vital signs in last 24 hours: Temp:  [97.9 F (36.6 C)-100.5 F (38.1 C)] 97.9 F (36.6 C) (06/22 0800) Pulse Rate:  [83-93] 85 (06/22 0800) Resp:  [18-20] 18 (06/22 0800) BP: (140-174)/(62-94) 174/94 mmHg (06/22 0800) SpO2:  [91 %-97 %] 96 % (06/22 0800) Weight:  [209 lb 10.5 oz (95.1 kg)] 209 lb 10.5 oz (95.1 kg) (06/22 0419) Did not examine Lab Results  Recent Labs  05/23/14 0330 05/24/14 0229  WBC 11.7* 11.0*  HGB 8.7* 8.0*  HCT 26.4* 24.0*  NA 143 142  K 3.7 3.2*  CL 105 103  CO2 24 25  BUN 4* 4*  CREATININE 0.88 0.77   Liver Panel  Recent Labs  05/23/14 0330 05/24/14 0229  PROT 6.3 5.9*  ALBUMIN 2.3* 2.1*  AST 23 17  ALT 41* 32  ALKPHOS 105 98  BILITOT 0.6 0.3   Sedimentation Rate No results found for this basename: ESRSEDRATE,  in the last 72 hours C-Reactive Protein No results found for this basename:  CRP,  in the last 72 hours  Microbiology: 6/17 blood cx staph aureus:MSSA 6/19 port cx + growth, reincubating  Studies/Results: Mr Lumbar Spine W Wo Contrast  05/22/2014   CLINICAL DATA:  Breast cancer. Staph bacteremia. Infected Port-A-Cath removed May 21, 2014. New onset lumbar spine pain.  EXAM: MRI LUMBAR SPINE WITHOUT AND WITH CONTRAST  TECHNIQUE: Multiplanar and multiecho pulse sequences of the lumbar spine were obtained without and with intravenous contrast.  CONTRAST:  21mL MULTIHANCE GADOBENATE DIMEGLUMINE 529 MG/ML IV SOLN  COMPARISON:  None.  FINDINGS: The vertebral bodies of the lumbar spine are normal in size and alignment. The marrow is diffusely low in signal likely secondary to patient receiving chemotherapy. The intervertebral disc spaces are well-maintained. There are no areas of abnormal enhancement.  The spinal cord is normal in signal and contour. The cord terminates normally at L1 . The nerve roots of the cauda equina and the filum terminale are normal. There is no epidural fluid collection.  The visualized portions of the SI joints are unremarkable.  The imaged intra-abdominal contents are unremarkable.  T12-L1: No significant disc bulge. No evidence of neural foraminal stenosis. No central canal stenosis.  L1-L2: No significant disc bulge. No evidence of neural  foraminal stenosis. No central canal stenosis.  L2-L3: No significant disc bulge. No evidence of neural foraminal stenosis. No central canal stenosis.  L3-L4: No significant disc bulge. No evidence of neural foraminal stenosis. No central canal stenosis.  L4-L5: No significant disc bulge. No evidence of neural foraminal stenosis. No central canal stenosis.  L5-S1: No significant disc bulge. No evidence of neural foraminal stenosis. No central canal stenosis.  IMPRESSION: 1. Normal MRI of the lumbar spine.   Electronically Signed   By: Kathreen Devoid   On: 05/22/2014 12:48     Assessment/Plan: MSSA bacteremia = 6/20 repeat  peripheral blood cx were drawn to document clearance of bacteremia. Port appears to be involved, cultures pending. Continue on  nafcillin for MSSA bacteremia  - recommend to get TEE since she still having fevers  - Would wait to place any picc line until we can document that she has cleared bacteremia from blood stream.    [place], [facility] for Infectious Diseases Cell: [phone] Pager: [phone]  05/24/2014, 10:55 AM

## 2014-05-25 ENCOUNTER — Encounter (HOSPITAL_COMMUNITY): Payer: Self-pay | Admitting: General Surgery

## 2014-05-25 DIAGNOSIS — T80212A Local infection due to central venous catheter, initial encounter: Secondary | ICD-10-CM

## 2014-05-25 DIAGNOSIS — M542 Cervicalgia: Secondary | ICD-10-CM

## 2014-05-25 DIAGNOSIS — Z1501 Genetic susceptibility to malignant neoplasm of breast: Secondary | ICD-10-CM

## 2014-05-25 DIAGNOSIS — A4101 Sepsis due to Methicillin susceptible Staphylococcus aureus: Principal | ICD-10-CM

## 2014-05-25 DIAGNOSIS — A412 Sepsis due to unspecified staphylococcus: Secondary | ICD-10-CM

## 2014-05-25 DIAGNOSIS — I82409 Acute embolism and thrombosis of unspecified deep veins of unspecified lower extremity: Secondary | ICD-10-CM

## 2014-05-25 LAB — HEPARIN LEVEL (UNFRACTIONATED)
HEPARIN UNFRACTIONATED: 0.34 [IU]/mL (ref 0.30–0.70)
Heparin Unfractionated: 0.71 IU/mL — ABNORMAL HIGH (ref 0.30–0.70)
Heparin Unfractionated: 0.77 IU/mL — ABNORMAL HIGH (ref 0.30–0.70)
Heparin Unfractionated: <0.1 IU/mL — ABNORMAL LOW (ref 0.30–0.70)

## 2014-05-25 LAB — COMPREHENSIVE METABOLIC PANEL
ALK PHOS: 87 U/L (ref 39–117)
ALT: 31 U/L (ref 0–35)
AST: 23 U/L (ref 0–37)
Albumin: 2.3 g/dL — ABNORMAL LOW (ref 3.5–5.2)
BILIRUBIN TOTAL: 0.3 mg/dL (ref 0.3–1.2)
BUN: 3 mg/dL — AB (ref 6–23)
CHLORIDE: 100 meq/L (ref 96–112)
CO2: 28 meq/L (ref 19–32)
Calcium: 9 mg/dL (ref 8.4–10.5)
Creatinine, Ser: 0.75 mg/dL (ref 0.50–1.10)
GFR calc non Af Amer: >90 mL/min (ref >90)
GLUCOSE: 99 mg/dL (ref 70–99)
POTASSIUM: 3.2 meq/L — AB (ref 3.7–5.3)
Sodium: 143 mEq/L (ref 137–147)
Total Protein: 6.3 g/dL (ref 6.0–8.3)

## 2014-05-25 LAB — CULTURE, ROUTINE-ABSCESS

## 2014-05-25 LAB — CBC
HEMATOCRIT: 24.4 % — AB (ref 36.0–46.0)
Hemoglobin: 7.9 g/dL — ABNORMAL LOW (ref 12.0–15.0)
MCH: 31 pg (ref 26.0–34.0)
MCHC: 32.4 g/dL (ref 30.0–36.0)
MCV: 95.7 fL (ref 78.0–100.0)
Platelets: 235 10*3/uL (ref 150–400)
RBC: 2.55 MIL/uL — AB (ref 3.87–5.11)
RDW: 18.1 % — ABNORMAL HIGH (ref 11.5–15.5)
WBC: 10.1 10*3/uL (ref 4.0–10.5)

## 2014-05-25 LAB — MAGNESIUM: Magnesium: 1.7 mg/dL (ref 1.5–2.5)

## 2014-05-25 LAB — PREPARE RBC (CROSSMATCH)

## 2014-05-25 MED ORDER — SODIUM CHLORIDE 0.9 % IJ SOLN
10.0000 mL | INTRAMUSCULAR | Status: DC | PRN
  Administered 2014-05-30 (×2): 10 mL

## 2014-05-25 MED ORDER — ONDANSETRON HCL 4 MG PO TABS
8.0000 mg | ORAL_TABLET | Freq: Once | ORAL | Status: AC
  Administered 2014-05-25: 8 mg via ORAL
  Filled 2014-05-25: qty 2

## 2014-05-25 MED ORDER — LISINOPRIL 5 MG PO TABS
5.0000 mg | ORAL_TABLET | Freq: Every day | ORAL | Status: DC
  Administered 2014-05-25 – 2014-05-27 (×3): 5 mg via ORAL
  Filled 2014-05-25 (×3): qty 1

## 2014-05-25 MED ORDER — SODIUM CHLORIDE 0.9 % IJ SOLN
10.0000 mL | Freq: Two times a day (BID) | INTRAMUSCULAR | Status: DC
  Administered 2014-05-25: 10 mL
  Administered 2014-05-26: 20 mL
  Administered 2014-05-26: 10 mL
  Administered 2014-05-27: 20 mL
  Administered 2014-05-27: 10 mL
  Administered 2014-05-28 (×2): 20 mL
  Administered 2014-05-29 – 2014-05-30 (×2): 10 mL

## 2014-05-25 MED ORDER — CARVEDILOL 6.25 MG PO TABS
6.2500 mg | ORAL_TABLET | Freq: Two times a day (BID) | ORAL | Status: DC
  Administered 2014-05-26 – 2014-05-28 (×5): 6.25 mg via ORAL
  Filled 2014-05-25 (×7): qty 1

## 2014-05-25 MED ORDER — HYDRALAZINE HCL 20 MG/ML IJ SOLN
10.0000 mg | Freq: Once | INTRAMUSCULAR | Status: AC
  Administered 2014-05-25: 10 mg via INTRAVENOUS
  Filled 2014-05-25: qty 1

## 2014-05-25 MED ORDER — ACETAMINOPHEN 325 MG PO TABS
650.0000 mg | ORAL_TABLET | Freq: Once | ORAL | Status: DC
  Filled 2014-05-25: qty 2

## 2014-05-25 NOTE — Progress Notes (Signed)
Peripherally Inserted Central Catheter/Midline Placement  The IV Nurse has discussed with the patient and/or persons authorized to consent for the patient, the purpose of this procedure and the potential benefits and risks involved with this procedure.  The benefits include less needle sticks, lab draws from the catheter and patient may be discharged home with the catheter.  Risks include, but not limited to, infection, bleeding, blood clot (thrombus formation), and puncture of an artery; nerve damage and irregular heat beat.  Alternatives to this procedure were also discussed.  PICC/Midline Placement Documentation        Tamara [name] 05/25/2014, 3:58 PM

## 2014-05-25 NOTE — Progress Notes (Signed)
[place] TEAM 1 - Stepdown/ICU TEAM Progress Note  Tamara [name] MGQ:[id number] DOB: 01/20/1971 DOA: 05/19/2014 PCP: Angelica Chessman, MD  Admit HPI / Brief Narrative: Tamara [name] is a 43 y.o. 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 She has also been having chest discomfort x 1 week and had a CTA of the Chest performed as an outpatient which was negative for a Pulmonary embolism. She had a temperature to 102 today.    HPI/Subjective: 6/23 neg SOB/CP, cont. pain around site of Port-A-Cath removal, (-)SOB   Assessment/Plan:  Sepsis/HCAP -Blood cultures x2 positive for Staphylococcus, awaiting sensitivities -Continue IV Vancomycin and Cefepime, and Albuterol nebs, O2 PRN, and IVFs.  -6/20 after speaking with Dr. Evlyn Clines (oncology) on 6/19,Dr. Evlyn Clines (oncology) agree that Port-A-Cath would need to be removed in order to clear sepsis. Patient S./P. Port-A-Cath removal on 6/19 -Continue vancomycin+ nafcillin per infectious disease  -MRI L-spine negative for abscess -Transesophageal Echocardiogram rule out vegetation Pending -6/23 repeat blood culture x2 NGTD patient has lost access for the second time, will have PICC line placed   DVT right internal jugular and subclavian veins. -Continue heparin  -Continue to Monitor closely for bleeding/hematoma right chest wall under surgical site  Sinus Tachycardia -Resolved -Increase Coreg 6.25 mg  BID    AKI -Resolved  HTN -Uncontrolled increase Coreg 6.25 mg BID -Start Small dose of lisinopril 5 mg daily  Iron deficiency Anemia/acute blood loss anemia vs  Destruction 2dary Chemo? -6/18 transfuse 2 unit  PRBC Occult Blood  -Anemia workup; appears patient has mixed anemia  -Patient's baseline hemoglobin approximately 10 last taken on 6/9. currently at 8.7 stable since transfusion - Continue Iron Rx. 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  -6/19 S./P. removal of Port-A-Cath -  Counseled patient and husband most likely oncology would not restart chemotherapy until infection was resolved. -Counseled that after obtaining blood cultures that are clear of organisms would place a CVC or PICC line in order to complete antibiotics and facilitate chemotherapy.  Neck/right shoulder pain -Positive for DVT -See DVT Right IJ/subclavian  L-spine pain -MRI rule out abscess; Normal MRI of the lumbar spine.    Code Status: FULL Family Communication: Mother present  Disposition Plan: Resolution sepsis;    Consultants: Dr. Evlyn Clines (oncology), Dr. Carlyle Basques (infectious disease) Dr. Greer Pickerel (surgery)   Procedure/Significant Events: 6/17 CXR;Minimal subsegmental atelectasis and/or infiltrate right lung bases;noted recent CT 05/11/2014.    6/19 S./P. Port-A-Cath removal secondary to sepsis 6/19 Doppler right upper extremity;positive for deep vein thrombosis involving the right internal jugular and subclavian veins.     Culture 6/17 BLOOD ARM LEFT x 2; POSITIVE GRAM  COCCI IN CLUSTERS, MSSA  6/17 Urine negative (final)   6/19 blood Port-A-Cath NGTD  6/20 blood left forearm/right arm NGTD 6/20 C. difficile by PCR negative   Antibiotics: Cefepime 6/18>> stopped 6/19 Vacomycin 6/18>>  Nafcillin 6/19>>  DVT prophylaxis: Lovenox   Devices Right chest wall Port-A-Cath>> removed 6/19   LINES / TUBES:  6/17 22 ga right hand>> infiltrated 6/23 6/17 ga left antecubital>> infiltrated 6/23    Continuous Infusions: .  sodium chloride 1,000 mL (05/24/14 1955)  . heparin 2,100 Units/hr (05/25/14 1037)     Objective: VITAL SIGNS: Temp: 98.6 F (37 C) (06/23 0800) Temp src: Oral (06/23 0800) BP: 177/79 mmHg (06/23 0816) Pulse Rate: 79 (06/23 0816) SPO2;96 % on RA FIO2:   Intake/Output Summary (Last 24 hours) at 05/25/14 1053 Last data filed at 05/25/14 0600  Gross per 24 hour  Intake 1621.73 ml  Output      0 ml  Net 1621.73 ml     Exam: General: A./O. x4, states first day feeling hungry, NAD, continued tenderness to palpation just superior to right collar bone and right chest wall around the incision site where the Port-A-Cath was removed . Lungs: Clear to auscultation bilaterally Cardiovascular: Regular rate and rhythm without murmur gallop or rub normal S1 and S2, Abdomen: Nontender, nondistended, soft, bowel sounds positive, no rebound, no ascites, no appreciable mass Extremities: No significant cyanosis, clubbing, or edema bilateral lower extremities  Data Reviewed: Basic Metabolic Panel:  Recent Labs Lab 05/21/14 0546 05/22/14 0322 05/23/14 0330 05/24/14 0229 05/25/14 0330  NA 139 140 143 142 143  K 3.6* 3.6* 3.7 3.2* 3.2*  CL 104 103 105 103 100  CO2 19 20 24 25 28   GLUCOSE 102* 102* 104* 120* 99  BUN 7 5* 4* 4* 3*  CREATININE 0.81 0.89 0.88 0.77 0.75  CALCIUM 8.7 8.4 8.7 8.5 9.0  MG 2.2 1.9 1.8 1.9 1.7   Liver Function Tests:  Recent Labs Lab 05/21/14 0546 05/22/14 0322 05/23/14 0330 05/24/14 0229 05/25/14 0330  AST 33 16 23 17 23   ALT 66* 45* 41* 32 31  ALKPHOS 171* 114 105 98 87  BILITOT 0.4 0.6 0.6 0.3 0.3  PROT 6.3 5.9* 6.3 5.9* 6.3  ALBUMIN 2.4* 2.1* 2.3* 2.1* 2.3*    Recent Labs Lab 05/19/14 1858  LIPASE 39   No results found for this basename: AMMONIA,  in the last 168 hours CBC:  Recent Labs Lab 05/19/14 1858  05/21/14 0812 05/22/14 0322 05/23/14 0330 05/24/14 0229 05/25/14 0330  WBC 28.3*  < > 16.0* 13.9* 11.7* 11.0* 10.1  NEUTROABS 24.9*  --  13.3* 10.4* 8.5* 7.7  --   HGB 8.8*  < > 8.7* 8.1* 8.7* 8.0* 7.9*  HCT  26.4*  < > 25.6* 24.4* 26.4* 24.0* 24.4*  MCV 92.3  < > 89.8 92.1 93.0 93.8 95.7  PLT 257  < > 180 197 259 217 235  < > = values in this interval not displayed. Cardiac Enzymes: No results found for this basename: CKTOTAL, CKMB, CKMBINDEX, TROPONINI,  in the last 168 hours BNP (last 3 results)  Recent Labs  05/11/14 1318  PROBNP 173.9*   CBG: No results found for this basename: GLUCAP,  in the last 168 hours  Recent Results (from the past 240 hour(s))  CULTURE, BLOOD (ROUTINE X 2)     Status: None   Collection Time    05/19/14  6:55 PM      Result Value Ref Range Status   Specimen Description BLOOD ARM LEFT   Final   Special Requests BOTTLES DRAWN AEROBIC AND ANAEROBIC 5CC   Final   Culture  Setup Time     Final   Value: 05/20/2014 01:19     Performed at [facility]   Culture     Final   Value: STAPHYLOCOCCUS AUREUS     Note: RIFAMPIN AND GENTAMICIN SHOULD NOT BE USED AS SINGLE DRUGS FOR TREATMENT OF  STAPH INFECTIONS. This organism is presumed to be Clindamycin resistant based on detection of inducible Clindamycin resistance.     Note: Gram Stain Report Called to,Read Back By and Verified With: DARA A@1 :15PM ON 05/20/14 BY DANTS     Performed at [facility]   Report Status 05/22/2014 FINAL   Final   Organism ID, Bacteria STAPHYLOCOCCUS AUREUS   Final  CULTURE, BLOOD (ROUTINE X 2)     Status: None   Collection Time    05/19/14  7:27 PM      Result Value Ref Range Status   Specimen Description BLOOD ARM LEFT   Final   Special Requests BOTTLES DRAWN AEROBIC AND ANAEROBIC 10CC   Final   Culture  Setup Time     Final   Value: 05/20/2014 01:19     Performed at [facility]   Culture     Final   Value: STAPHYLOCOCCUS AUREUS     Note: SUSCEPTIBILITIES PERFORMED ON PREVIOUS CULTURE WITHIN THE LAST 5 DAYS.     Note: Gram Stain Report Called to,Read Back By and Verified With: DARA A@1 :15PM ON 05/20/14 BY DANTS     Performed at [facility]   Report  Status 05/22/2014 FINAL   Final  URINE CULTURE     Status: None   Collection Time    05/19/14  8:01 PM      Result Value Ref Range Status   Specimen Description URINE, RANDOM   Final   Special Requests NONE   Final   Culture  Setup Time     Final   Value: 05/19/2014 20:34     Performed at [place]     Final   Value: NO GROWTH     Performed at [facility]   Culture     Final   Value: NO GROWTH     Performed at [facility]   Report Status 05/20/2014 FINAL   Final  MRSA PCR SCREENING     Status: None   Collection Time    05/19/14  9:51 PM      Result Value Ref Range Status   MRSA by PCR NEGATIVE  NEGATIVE Final   Comment:            The GeneXpert MRSA Assay (FDA     approved for NASAL specimens     only), is one component of a     comprehensive MRSA colonization     surveillance program. It is not     intended to diagnose MRSA     infection nor to guide or     monitor treatment for     MRSA infections.  CULTURE, ROUTINE-ABSCESS     Status: None   Collection Time    05/21/14  7:40 PM      Result Value Ref Range Status   Specimen Description ABSCESS PORTA CATH EXIT SITE   Final   Special Requests POF VANC MAXIPINE NAFICILLIN   Final   Gram Stain     Final   Value: RARE WBC PRESENT, PREDOMINANTLY PMN     NO ORGANISMS SEEN     Performed at [facility]   Culture     Final   Value: MODERATE STAPHYLOCOCCUS AUREUS     Note: RIFAMPIN AND GENTAMICIN SHOULD NOT BE USED AS SINGLE DRUGS FOR TREATMENT OF STAPH INFECTIONS. This organism is presumed to be Clindamycin resistant based on detection of inducible Clindamycin resistance.  Performed at [facility]   Report Status 05/25/2014 FINAL   Final   Organism ID, Bacteria STAPHYLOCOCCUS AUREUS   Final  ANAEROBIC CULTURE     Status: None   Collection Time    05/21/14  7:40 PM      Result Value Ref Range Status   Specimen Description ABSCESS PORTA CATH EXIT SITE   Final    Special Requests POF VANC MAXIPINE NAFICILLIN   Final   Gram Stain     Final   Value: RARE WBC PRESENT, PREDOMINANTLY PMN     NO ORGANISMS SEEN     Performed at [facility]   Culture     Final   Value: NO ANAEROBES ISOLATED; CULTURE IN PROGRESS FOR 5 DAYS     Performed at [facility]   Report Status PENDING   Incomplete  CULTURE, BLOOD (ROUTINE X 2)     Status: None   Collection Time    05/22/14  1:00 PM      Result Value Ref Range Status   Specimen Description BLOOD LEFT FOREARM   Final   Special Requests BOTTLES DRAWN AEROBIC AND ANAEROBIC 5CC   Final   Culture  Setup Time     Final   Value: 05/22/2014 18:54     Performed at [facility]   Culture     Final   Value:        BLOOD CULTURE RECEIVED NO GROWTH TO DATE CULTURE WILL BE HELD FOR 5 DAYS BEFORE ISSUING A FINAL NEGATIVE REPORT     Performed at [facility]   Report Status PENDING   Incomplete  CULTURE, BLOOD (ROUTINE X 2)     Status: None   Collection Time    05/22/14  2:09 PM      Result Value Ref Range Status   Specimen Description BLOOD RIGHT ARM   Final   Special Requests BOTTLES DRAWN AEROBIC AND ANAEROBIC 10CC   Final   Culture  Setup Time     Final   Value: 05/22/2014 18:54     Performed at [facility]   Culture     Final   Value:        BLOOD CULTURE RECEIVED NO GROWTH TO DATE CULTURE WILL BE HELD FOR 5 DAYS BEFORE ISSUING A FINAL NEGATIVE REPORT     Performed at [facility]   Report Status PENDING   Incomplete  CLOSTRIDIUM DIFFICILE BY PCR     Status: None   Collection Time    05/22/14  2:14 PM      Result Value Ref Range Status   C difficile by pcr NEGATIVE  NEGATIVE Final     Studies:  Recent x-ray studies have been reviewed in detail by the Attending Physician  Scheduled Meds:  Scheduled Meds: . carvedilol  3.125 mg Oral BID WC  . feeding supplement (ENSURE COMPLETE)  237 mL Oral BID BM  . ferrous sulfate  325 mg Oral TID WC  . hydrALAZINE   10 mg Oral 3 times per day  . nafcillin IV  2 g Intravenous 6 times per day  . pantoprazole  40 mg Oral Daily  . potassium chloride  40 mEq Oral BID  . vitamin C  500 mg Oral TID    Time spent on care of this patient: 40 mins   Allie Bossier , MD   [facility] Office  [phone] Pager [phone]  On-Call/Text Page:  [email or web address]      password [email or web address]  If 7PM-7AM, please contact night-coverage [email or web address] Password [facility] 05/25/2014, 10:53 AM   LOS: 6 days

## 2014-05-25 NOTE — Progress Notes (Addendum)
[place] for Infectious Disease    Date of Admission:  05/19/2014   Total days of antibiotics 7        Day 5 nafcillin        ID: Tamara [name] is a 43 y.o. female with breast cancer undergoing adjuvent chemo just finished 4th cycle presents with fever, found to have MSSA bacteremia thought be associated with port a cath and DVT now on heparin Principal Problem:   Sepsis Active Problems:   Breast cancer of upper-outer quadrant of left female breast   HTN (hypertension)   Iron deficiency anemia, unspecified   HCAP (healthcare-associated pneumonia)   Sinus tachycardia   AKI (acute kidney injury)   Acute blood loss anemia   Staphylococcal sepsis   DVT (deep venous thrombosis) right IJ/subclavian    Subjective: Afebrile x 24hr.TEE postponed today. Noticing swelling to dorsum of left hand non tender, concern about her piv infiltrating. stil mildly tender at portacath removal site  Medications:  . carvedilol  3.125 mg Oral BID WC  . feeding supplement (ENSURE COMPLETE)  237 mL Oral BID BM  . ferrous sulfate  325 mg Oral TID WC  . hydrALAZINE  10 mg Oral 3 times per day  . nafcillin IV  2 g Intravenous 6 times per day  . pantoprazole  40 mg Oral Daily  . potassium chloride  40 mEq Oral BID  . vitamin C  500 mg Oral TID    Objective: Vital signs in last 24 hours: Temp:  [98.3 F (36.8 C)-99.2 F (37.3 C)] 98.6 F (37 C) (06/23 0800) Pulse Rate:  [75-86] 79 (06/23 0816) Resp:  [18-22] 22 (06/23 0800) BP: (159-219)/(79-139) 177/79 mmHg (06/23 0816) SpO2:  [93 %-100 %] 96 % (06/23 0800) Weight:  [208 lb 5.4 oz (94.5 kg)] 208 lb 5.4 oz (94.5 kg) (06/23 0343) Physical Exam  Constitutional:  oriented to person, place, and time. appears well-developed and well-nourished.fatigue appearing No distress.  HENT:  Mouth/Throat: Oropharynx is clear and moist. No oropharyngeal exudate.  Cardiovascular: Normal rate, regular rhythm and normal heart sounds. Exam reveals no gallop and  no friction rub.  No murmur heard.  Pulmonary/Chest: Effort normal and breath sounds normal. No respiratory distress.  has no wheezes.  Chest wall= no erythema at portacath removal site, packed but tender to palpation in superior aspect Ext: right dorsum of hand is swollen non erythematous Neurological: alert and oriented to person, place, and time.  Skin: Skin is warm and dry. No rash noted. No erythema.   Lab Results  Recent Labs  05/24/14 0229 05/25/14 0330  WBC 11.0* 10.1  HGB 8.0* 7.9*  HCT 24.0* 24.4*  NA 142 143  K 3.2* 3.2*  CL 103 100  CO2 25 28  BUN 4* 3*  CREATININE 0.77 0.75   Liver Panel  Recent Labs  05/24/14 0229 05/25/14 0330  PROT 5.9* 6.3  ALBUMIN 2.1* 2.3*  AST 17 23  ALT 32 31  ALKPHOS 98 87  BILITOT 0.3 0.3   Sedimentation Rate No results found for this basename: ESRSEDRATE,  in the last 72 hours C-Reactive Protein No results found for this basename: CRP,  in the last 72 hours  Microbiology: 6/17 blood cx staph aureus:MSSA 6/19 port cx + MSSA 6/20 blood cx NGTD  Studies/Results: No results found.   Assessment/Plan: MSSA bacteremia = 6/20 repeat peripheral blood cx are NGTD, showing clearance of bacteremia. Port appears to be involved, cx + MSSA. Continue on  nafcillin  for MSSA bacteremia  - recommend to get TEE to help decide if she will get 2wk vs. 6 wk of therapy  IV access= can have  picc line placed today for iv antibiotics and heparin    SNIDER, [facility] for Infectious Diseases Cell: [phone] Pager: [phone]  05/25/2014, 12:28 PM

## 2014-05-25 NOTE — Discharge Instructions (Signed)
Dressing Change °A dressing is a material placed over wounds. It keeps the wound clean, dry, and protected from further injury. This provides an environment that favors wound healing.  °BEFORE YOU BEGIN °· Get your supplies together. Things you may need include: °¨ Saline solution. °¨ Flexible gauze dressing. °¨ Medicated cream. °¨ Tape. °¨ Gloves. °¨ Abdominal dressing pads. °¨ Gauze squares. °¨ Plastic bags. °· Take pain medicine 30 minutes before the dressing change if you need it. °· Take a shower before you do the first dressing change of the day. Use plastic wrap or a plastic bag to prevent the dressing from getting wet. °REMOVING YOUR OLD DRESSING  °· Wash your hands with soap and water. Dry your hands with a clean towel. °· Put on your gloves. °· Remove any tape. °· Carefully remove the old dressing. If the dressing sticks, you may dampen it with warm water to loosen it, or follow your caregiver's specific directions. °· Remove any gauze or packing tape that is in your wound. °· Take off your gloves. °· Put the gloves, tape, gauze, or any packing tape into a plastic bag. °CHANGING YOUR DRESSING °· Open the supplies. °· Take the cap off the saline solution. °· Open the gauze package so that the gauze remains on the inside of the package. °· Put on your gloves. °· Clean your wound as told by your caregiver. °· If you have been told to keep your wound dry, follow those instructions. °· Your caregiver may tell you to do one or more of the following: °¨ Pick up the gauze. Pour the saline solution over the gauze. Squeeze out the extra saline solution. °¨ Put medicated cream or other medicine on your wound if you have been told to do so. °¨ Put the solution soaked gauze only in your wound, not on the skin around it. °¨ Pack your wound loosely or as told by your caregiver. °¨ Put dry gauze on your wound. °¨ Put abdominal dressing pads over the dry gauze if your wet gauze soaks through. °· Tape the abdominal dressing  pads in place so they will not fall off. Do not wrap the tape completely around the affected part (arm, leg, abdomen). °· Wrap the dressing pads with a flexible gauze dressing to secure it in place. °· Take off your gloves. Put them in the plastic bag with the old dressing. Tie the bag shut and throw it away. °· Keep the dressing clean and dry until your next dressing change. °· Wash your hands. °SEEK MEDICAL CARE IF: °· Your skin around the wound looks red. °· Your wound feels more tender or sore. °· You see pus in the wound. °· Your wound smells bad. °· You have a fever. °· Your skin around the wound has a rash that itches and burns. °· You see black or yellow skin in your wound that was not there before. °· You feel nauseous, throw up, and feel very tired. °Document Released: 12/27/2004 Document Revised: 02/11/2012 Document Reviewed: 10/01/2011 °ExitCare® Patient Information ©2015 [employer]. This information is not intended to replace advice given to you by your health care provider. Make sure you discuss any questions you have with your health care provider. ° °

## 2014-05-25 NOTE — Progress Notes (Signed)
PHARMACY NOTE  Pharmacy Consult :  43 y.o. female is currently on Heparin infusion for DVT.   Heparin Dosing Wt :  78 kg  Hematology :  Recent Labs  05/22/14 0322 05/23/14 0330 05/23/14 1110 05/23/14 2008 05/24/14 0229 05/24/14 1400 05/24/14 2245  HGB 8.1* 8.7*  --   --  8.0*  --   --   HCT 24.4* 26.4*  --   --  24.0*  --   --   PLT 197 259  --   --  217  --   --   HEPARINUNFRC  --  <0.10* <0.10* 0.26* 0.23* 0.68 0.71*  CREATININE 0.89 0.88  --   --  0.77  --   --    Current Medication[s] Include:  Infusion[s]: Infusions:  . sodium chloride 1,000 mL (05/24/14 1955)  . heparin 2,400 Units/hr (05/24/14 2133)    Assessment :  Heparin infusing at 2400 units/hr, Heparin level trending slowly up.  Heparin level 0.71.  No evidence of bleeding complications noted .  Goal :  Heparin goal is Heparin level 0.3-0.7 units/ml.  Plan : 1. Heparin will be reduced to 2250 units/hr.   The next Heparin Level with AM hours 2. Continue Daily Heparin level, CBC while on Heparin.  Monitor for bleeding complications. Follow Platelet counts.  Jahlia Omura, Craig Guess,  Pharm.D  05/25/2014  12:21 AM

## 2014-05-25 NOTE — Progress Notes (Signed)
[place] for heparin Indication: DVT  No Known Allergies  Patient Measurements: Height: 5\' 5"  (165.1 cm) Weight: 208 lb 5.4 oz (94.5 kg) IBW/kg (Calculated) : 57 Heparin Dosing Weight: 78.2 kg  Vital Signs: Temp: 98.6 F (37 C) (06/23 0800) Temp src: Oral (06/23 0800) BP: 177/79 mmHg (06/23 0816) Pulse Rate: 79 (06/23 0816)  Labs:  Recent Labs  05/23/14 0330  05/24/14 0229 05/24/14 1400 05/24/14 2245 05/25/14 0330 05/25/14 0920  HGB 8.7*  --  8.0*  --   --  7.9*  --   HCT 26.4*  --  24.0*  --   --  24.4*  --   PLT 259  --  217  --   --  235  --   HEPARINUNFRC <0.10*  < > 0.23* 0.68 0.71*  --  0.77*  CREATININE 0.88  --  0.77  --   --  0.75  --   < > = values in this interval not displayed.  Estimated Creatinine Clearance: 106.3 ml/min (by C-G formula based on Cr of 0.75).  Infusions:  . sodium chloride 1,000 mL (05/24/14 1955)  . heparin 2,250 Units/hr (05/25/14 0116)    Assessment: 43 YOF undergoing chemotherapy for breast cancer admitted 05/19/2014 who continues on IV heparin for new DVT. The heparin level this morning remains SUPRAtherapetuic despite a rate decrease yesterday evening (HL 0.77 << 0.71, goal of 0.3-0.7). Hgb/Hct low at baseline (d/t underlying anemia and chemotherapy) - stable. Plts wnl. No overt s/sx of bleeding noted.  Goal of Therapy:  Heparin level 0.3-0.7 units/ml Monitor platelets by anticoagulation protocol: Yes   Plan:  1. Reduce heparin to 2100 units/hr (21 m/hr) 2. Will continue to monitor for any signs/symptoms of bleeding and will follow up with heparin level in 6 hours   Alycia Rossetti, PharmD, BCPS Clinical Pharmacist Pager: [phone] 05/25/2014 10:08 AM

## 2014-05-25 NOTE — Progress Notes (Signed)
Patient ID: Tamara [name], female   DOB: 1971-03-24, 43 y.o.   MRN: [id number] 4 Days Post-Op  Subjective: Pt feels well today.  Right chest feels ok  Objective: Vital signs in last 24 hours: Temp:  [97.9 F (36.6 C)-99.2 F (37.3 C)] 98.5 F (36.9 C) (06/23 0343) Pulse Rate:  [75-86] 75 (06/23 0343) Resp:  [18] 18 (06/22 1600) BP: (169-219)/(71-139) 177/79 mmHg (06/23 0549) SpO2:  [93 %-100 %] 98 % (06/23 0343) Weight:  [208 lb 5.4 oz (94.5 kg)] 208 lb 5.4 oz (94.5 kg) (06/23 0343) Last BM Date: 05/24/14  Intake/Output from previous day: 06/22 0701 - 06/23 0700 In: 2650.8 [P.O.:600; I.V.:1900.8; IV Piggyback:150] Out: -  Intake/Output this shift:    PE: Chest: wound is clean and packed.  No surrounding erythema or infection noted  Lab Results:   Recent Labs  05/24/14 0229 05/25/14 0330  WBC 11.0* 10.1  HGB 8.0* 7.9*  HCT 24.0* 24.4*  PLT 217 235   BMET  Recent Labs  05/24/14 0229 05/25/14 0330  NA 142 143  K 3.2* 3.2*  CL 103 100  CO2 25 28  GLUCOSE 120* 99  BUN 4* 3*  CREATININE 0.77 0.75  CALCIUM 8.5 9.0   PT/INR No results found for this basename: LABPROT, INR,  in the last 72 hours CMP     Component Value Date/Time   NA 143 05/25/2014 0330   NA 137 05/14/2014 1449   K 3.2* 05/25/2014 0330   K 3.9 05/14/2014 1449   CL 100 05/25/2014 0330   CO2 28 05/25/2014 0330   CO2 29 05/14/2014 1449   GLUCOSE 99 05/25/2014 0330   GLUCOSE 99 05/14/2014 1449   BUN 3* 05/25/2014 0330   BUN 10.5 05/14/2014 1449   CREATININE 0.75 05/25/2014 0330   CREATININE 0.9 05/14/2014 1449   CALCIUM 9.0 05/25/2014 0330   CALCIUM 9.2 05/14/2014 1449   PROT 6.3 05/25/2014 0330   PROT 6.3* 05/14/2014 1449   ALBUMIN 2.3* 05/25/2014 0330   ALBUMIN 3.2* 05/14/2014 1449   AST 23 05/25/2014 0330   AST 59* 05/14/2014 1449   ALT 31 05/25/2014 0330   ALT 145* 05/14/2014 1449   ALKPHOS 87 05/25/2014 0330   ALKPHOS 91 05/14/2014 1449   BILITOT 0.3 05/25/2014 0330   BILITOT 0.22 05/14/2014 1449   GFRNONAA >90 05/25/2014 0330   GFRAA >90 05/25/2014 0330   Lipase     Component Value Date/Time   LIPASE 39 05/19/2014 1858       Studies/Results: No results found.  Anti-infectives: Anti-infectives   Start     Dose/Rate Route Frequency Ordered Stop   05/21/14 2230  vancomycin (VANCOCIN) 1,250 mg in sodium chloride 0.9 % 250 mL IVPB  Status:  Discontinued     1,250 mg 166.7 mL/hr over 90 Minutes Intravenous Every 8 hours 05/21/14 2211 05/22/14 1359   05/21/14 1600  nafcillin 2 g in dextrose 5 % 50 mL IVPB     2 g 100 mL/hr over 30 Minutes Intravenous 6 times per day 05/21/14 1326     05/21/14 1300  nafcillin injection 2 g  Status:  Discontinued     2 g Intravenous Every 6 hours 05/21/14 1227 05/21/14 1230   05/21/14 1300  nafcillin 2 g in dextrose 5 % 50 mL IVPB  Status:  Discontinued     2 g 100 mL/hr over 30 Minutes Intravenous 4 times per day 05/21/14 1230 05/21/14 1326   05/21/14 1230  nafcillin injection 2 g  Status:  Discontinued     2 g Intravenous Every 6 hours 05/21/14 1225 05/21/14 1226   05/20/14 0600  ceFEPIme (MAXIPIME) 1 g in dextrose 5 % 50 mL IVPB  Status:  Discontinued     1 g 100 mL/hr over 30 Minutes Intravenous 3 times per day 05/19/14 1912 05/21/14 1326   05/20/14 0400  vancomycin (VANCOCIN) IVPB 1000 mg/200 mL premix  Status:  Discontinued     1,000 mg 200 mL/hr over 60 Minutes Intravenous Every 8 hours 05/19/14 1912 05/21/14 2211   05/19/14 1915  ceFEPIme (MAXIPIME) 2 g in dextrose 5 % 50 mL IVPB     2 g 100 mL/hr over 30 Minutes Intravenous  Once 05/19/14 1904 05/19/14 2108   05/19/14 1915  vancomycin (VANCOCIN) IVPB 1000 mg/200 mL premix     1,000 mg 200 mL/hr over 60 Minutes Intravenous  Once 05/19/14 1904 05/19/14 2108       Assessment/Plan  1. Infected PAC, s/p removal POD 4 2. Internal jugular and subclavian thrombosis 3. MSSA bacteremia  Plan: 1. Cont local wound care 2. Patient may follow up with Dr. Brantley Stage once she is discharged.   Will need to continue dressing changes at home.  We will sign off.  Call if needed   LOS: 6 days    OSBORNE,KELLY E 05/25/2014, 7:54 AM Pager: [phone]

## 2014-05-25 NOTE — Progress Notes (Signed)
Agree with above 

## 2014-05-25 NOTE — Progress Notes (Signed)
    [facility] has been requested to perform a transesophageal echocardiogram on Tamara [name] for bacteremia.  After careful review of history and examination, the risks and benefits of transesophageal echocardiogram have been explained including risks of esophageal damage, perforation (1:10,000 risk), bleeding, pharyngeal hematoma as well as other potential complications associated with conscious sedation including aspiration, arrhythmia, respiratory failure and death. Alternatives to treatment were discussed, questions were answered. Patient is willing to proceed.   Schedule is full today. This procedure is scheduled for tomorrow with Dr. Aundra Dubin.  Melina Copa, PA-C 05/25/2014 7:46 AM

## 2014-05-25 NOTE — Progress Notes (Signed)
ANTICOAGULATION CONSULT NOTE   Pharmacy Consult for heparin Indication: DVT  No Known Allergies  Patient Measurements: Height: 5\' 5"  (165.1 cm) Weight: 208 lb 5.4 oz (94.5 kg) IBW/kg (Calculated) : 57 Heparin Dosing Weight: 78.2 kg  Vital Signs: Temp: 98.4 F (36.9 C) (06/23 1553) Temp src: Oral (06/23 1553) BP: 184/102 mmHg (06/23 1627) Pulse Rate: 88 (06/23 1553)  Labs:  Recent Labs  05/23/14 0330  05/24/14 0229  05/24/14 2245 05/25/14 0330 05/25/14 0920 05/25/14 1705  HGB 8.7*  --  8.0*  --   --  7.9*  --   --   HCT 26.4*  --  24.0*  --   --  24.4*  --   --   PLT 259  --  217  --   --  235  --   --   HEPARINUNFRC <0.10*  < > 0.23*  < > 0.71*  --  0.77* <0.10*  CREATININE 0.88  --  0.77  --   --  0.75  --   --   < > = values in this interval not displayed.  Estimated Creatinine Clearance: 106.3 ml/min (by C-G formula based on Cr of 0.75).  Infusions:  . sodium chloride 1,000 mL (05/24/14 1955)  . heparin 2,100 Units/hr (05/25/14 1619)    Assessment: 46 YOF undergoing chemotherapy for breast cancer admitted 05/19/2014 who continues on IV heparin for new DVT.  This evening's heparin level is <0.1 because IV access was lost at 1200 and re-established at 1600.   Goal of Therapy:  Heparin level 0.3-0.7 units/ml Monitor platelets by anticoagulation protocol: Yes   Plan:  1. Continue heparin at 2100 units/hr 2. Heparin level at 2200 (6 hours after heparin level resumed)  Heide Guile, PharmD, [facility] Clinical Pharmacist Pager [phone]   05/25/2014 5:49 PM

## 2014-05-25 NOTE — Progress Notes (Addendum)
ANTICOAGULATION CONSULT NOTE   Pharmacy Consult for heparin Indication: DVT  No Known Allergies  Patient Measurements: Height: 5\' 5"  (165.1 cm) Weight: 208 lb 5.4 oz (94.5 kg) IBW/kg (Calculated) : 57 Heparin Dosing Weight: 78.2 kg  Vital Signs: Temp: 98.6 F (37 C) (06/23 2225) Temp src: Oral (06/23 1943) BP: 166/73 mmHg (06/23 2225) Pulse Rate: 89 (06/23 2225)  Labs:  Recent Labs  05/23/14 0330  05/24/14 0229  05/25/14 0330 05/25/14 0920 05/25/14 1705 05/25/14 2211  HGB 8.7*  --  8.0*  --  7.9*  --   --   --   HCT 26.4*  --  24.0*  --  24.4*  --   --   --   PLT 259  --  217  --  235  --   --   --   HEPARINUNFRC <0.10*  < > 0.23*  < >  --  0.77* <0.10* 0.34  CREATININE 0.88  --  0.77  --  0.75  --   --   --   < > = values in this interval not displayed.  Estimated Creatinine Clearance: 106.3 ml/min (by C-G formula based on Cr of 0.75).  Infusions:  . sodium chloride 1,000 mL (05/24/14 1955)  . heparin 2,100 Units/hr (05/25/14 1619)    Assessment: 72 YOF undergoing chemotherapy for breast cancer admitted 05/19/2014 who continues on IV heparin for new DVT.  This evening's heparin level is <0.1 because IV access was lost at 1200 and re-established at 1600.    Heparin level therapeutic this PM  Goal of Therapy:  Heparin level 0.3-0.7 units/ml Monitor platelets by anticoagulation protocol: Yes   Plan:  1. Continue heparin at 2100 units/hr 2. Follow up AM labs  Thank you. Anette Guarneri, PharmD [phone]  05/25/2014 11:02 PM    Addum:  Heparin level this am 0.24 units/ml.  Will increase heparin to 2200 units/hr.  Recheck level 6 hours after rate change.  Kaleab Frasier,PharmD

## 2014-05-25 NOTE — Progress Notes (Signed)
05/25/2014, 1:58 PM  Hospital day 7 Antibiotics: nafcillin Chemotherapy: cycle 4 neoadjuvant carboplatin/taxotere/herceptin/pertuzumab given 05-13-14     Subjective: Patient seen, with friend here. Still weak when up to [facility], glad to have assistance when she needs to get up. Taste still unpleasant since chemo, tho she can tolerate chocolate Ensure. Low grade nausea. Watery BM x1 this am. No pain. Still SOB with minimal activity. No bleeding.   TEE delayed until tomorrow. For PICC, ok per ID with second blood cultures still negative.No IV access now since peripheral infiltrated LUE.  Objective: Vital signs in last 24 hours: Blood pressure 195/96, pulse 81, temperature 98.4 F (36.9 C), temperature source Oral, resp. rate 20, height 5' 5"  (1.651 m), weight 208 lb 5.4 oz (94.5 kg), last menstrual period 02/19/2014, SpO2 99.00%.   Intake/Output from previous day: 06/22 0701 - 06/23 0700 In: 2650.8 [P.O.:600; I.V.:1900.8; IV Piggyback:150] Out: -  Intake/Output this shift:    Physical exam: awake, alert, appropriate, sitting in bed. Looks generally weak but NAD. Respirations not labored RA at rest. Ice pack to LUE IV infiltration. Mucous membranes pale. Oral mucosa moist without lesions. Heart RRR. Dry dressing on PAC site. Right neck less tender. Lungs without wheezes or rales. Abdomen soft, not distended, some normal BS. LE no edema, cords, tenderness. Left hand and forearm puffy from IV infiltration.   Lab Results:  Recent Labs  05/24/14 0229 05/25/14 0330  WBC 11.0* 10.1  HGB 8.0* 7.9*  HCT 24.0* 24.4*  PLT 217 235   BMET  Recent Labs  05/24/14 0229 05/25/14 0330  NA 142 143  K 3.2* 3.2*  CL 103 100  CO2 25 28  GLUCOSE 120* 99  BUN 4* 3*  CREATININE 0.77 0.75  CALCIUM 8.5 9.0   Cultures as noted I have ordered C diff again due to recurrent watery diarrhea, as she was on extended antibiotics PTA also.  Studies/Results: No results found.   Discussed further  drop in hemoglobin, which seems to be contributing to weakness now and will drop further with additional chemo planned. She is agreeable to transfusion of another unit of PRBCs, which will be best done after PICC is placed, so that crossmatch can be drawn from PICC and that line used for transfusion. If PICC not placed until late today, I would give PRBCs tomorrow rather than keeping her up for these thru night. Unit RN aware of PRBC timing as above.  DIscussed diet choices, again encouraged at least 4 Ensure daily for now and will ask dietician to follow up. She had been able to eat bread and sometimes sandwiches after chemo previously.  May do better using antiemetics more.    Assessment/Plan: 1.Methicillin sensitive staph aureus sepsis from [facility] infection: PAC removed 05-21-14, continuing nafcillin, TEE pending. ID following.  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 Due cycle 5 on 05-27-14, which likely will need to delay at least a week depending on status of the staph infection. Plan repeat breast MRI after neoadjuvant chemo completes, then left breast surgery.  3.difficult peripheral venous access: for PICC today as repeat blood cultures still negative. 4.Right IJ and subclavian vein DVT: on heparin by pharmacy.  5.diarrhea: C diff negative on 05-22-14 but watery diarrhea this AM so will repeat  6. Poor po intake: I have encouraged  Ensure at least 4 daily if not other good po's and will ask dietician to follow up 7.anemia: multifactorial, post IV iron at [facility] and transfused 2 u PRBCs on admission. Will give one unit after PICC available as below 8, symptomatic, and additional chemo anticipated. 8.apparently overdue gyn exam. Note uptake in  endometrial canal on PET, possibly related to phase of ovulation.  9.back pain improved, normal LS MRI  Will follow. Please call between my rounds if needed.  LIVESAY,LENNIS P MD [phone]

## 2014-05-26 ENCOUNTER — Encounter (HOSPITAL_COMMUNITY): Payer: Self-pay | Admitting: Gastroenterology

## 2014-05-26 ENCOUNTER — Other Ambulatory Visit: Payer: Self-pay | Admitting: Oncology

## 2014-05-26 ENCOUNTER — Encounter (HOSPITAL_COMMUNITY)
Admission: EM | Disposition: A | Discharge status: Home Health | Payer: Self-pay | Source: Home / Self Care | Attending: Internal Medicine

## 2014-05-26 DIAGNOSIS — I38 Endocarditis, valve unspecified: Secondary | ICD-10-CM

## 2014-05-26 DIAGNOSIS — Z5189 Encounter for other specified aftercare: Secondary | ICD-10-CM

## 2014-05-26 HISTORY — PX: TEE WITHOUT CARDIOVERSION: SHX5443

## 2014-05-26 LAB — TYPE AND SCREEN
ABO/RH(D): O NEG
Antibody Screen: NEGATIVE
UNIT DIVISION: 0

## 2014-05-26 LAB — COMPREHENSIVE METABOLIC PANEL
ALT: 25 U/L (ref 0–35)
AST: 18 U/L (ref 0–37)
Albumin: 2.5 g/dL — ABNORMAL LOW (ref 3.5–5.2)
Alkaline Phosphatase: 92 U/L (ref 39–117)
BUN: 5 mg/dL — ABNORMAL LOW (ref 6–23)
CO2: 28 mEq/L (ref 19–32)
Calcium: 9.3 mg/dL (ref 8.4–10.5)
Chloride: 103 mEq/L (ref 96–112)
Creatinine, Ser: 0.82 mg/dL (ref 0.50–1.10)
GFR calc Af Amer: >90 mL/min (ref >90)
GFR calc non Af Amer: 88 mL/min — ABNORMAL LOW (ref >90)
GLUCOSE: 102 mg/dL — AB (ref 70–99)
Potassium: 3.8 mEq/L (ref 3.7–5.3)
SODIUM: 143 meq/L (ref 137–147)
TOTAL PROTEIN: 6.8 g/dL (ref 6.0–8.3)
Total Bilirubin: 0.4 mg/dL (ref 0.3–1.2)

## 2014-05-26 LAB — MAGNESIUM: MAGNESIUM: 2.1 mg/dL (ref 1.5–2.5)

## 2014-05-26 LAB — CBC
HCT: 28.6 % — ABNORMAL LOW (ref 36.0–46.0)
Hemoglobin: 9.3 g/dL — ABNORMAL LOW (ref 12.0–15.0)
MCH: 30.3 pg (ref 26.0–34.0)
MCHC: 32.5 g/dL (ref 30.0–36.0)
MCV: 93.2 fL (ref 78.0–100.0)
Platelets: 200 10*3/uL (ref 150–400)
RBC: 3.07 MIL/uL — ABNORMAL LOW (ref 3.87–5.11)
RDW: 20.4 % — ABNORMAL HIGH (ref 11.5–15.5)
WBC: 9.8 10*3/uL (ref 4.0–10.5)

## 2014-05-26 LAB — ANAEROBIC CULTURE

## 2014-05-26 LAB — HEPARIN LEVEL (UNFRACTIONATED): Heparin Unfractionated: 0.24 IU/mL — ABNORMAL LOW (ref 0.30–0.70)

## 2014-05-26 LAB — CLOSTRIDIUM DIFFICILE BY PCR: Toxigenic C. Difficile by PCR: NEGATIVE

## 2014-05-26 LAB — OCCULT BLOOD X 1 CARD TO LAB, STOOL: Fecal Occult Bld: NEGATIVE

## 2014-05-26 SURGERY — ECHOCARDIOGRAM, TRANSESOPHAGEAL
Anesthesia: Moderate Sedation

## 2014-05-26 MED ORDER — MIDAZOLAM HCL 5 MG/ML IJ SOLN
INTRAMUSCULAR | Status: AC
  Filled 2014-05-26: qty 2

## 2014-05-26 MED ORDER — HYDRALAZINE HCL 20 MG/ML IJ SOLN
INTRAMUSCULAR | Status: DC | PRN
  Administered 2014-05-26: 10 mg via INTRAVENOUS

## 2014-05-26 MED ORDER — ENSURE COMPLETE PO LIQD
237.0000 mL | Freq: Four times a day (QID) | ORAL | Status: DC
  Administered 2014-05-26 – 2014-05-29 (×9): 237 mL via ORAL

## 2014-05-26 MED ORDER — FENTANYL CITRATE 0.05 MG/ML IJ SOLN
INTRAMUSCULAR | Status: DC | PRN
  Administered 2014-05-26: 25 ug via INTRAVENOUS
  Administered 2014-05-26: 50 ug via INTRAVENOUS

## 2014-05-26 MED ORDER — FENTANYL CITRATE 0.05 MG/ML IJ SOLN
INTRAMUSCULAR | Status: AC
  Filled 2014-05-26: qty 2

## 2014-05-26 MED ORDER — CARVEDILOL 6.25 MG PO TABS
6.2500 mg | ORAL_TABLET | Freq: Once | ORAL | Status: AC
  Administered 2014-05-26: 6.25 mg via ORAL
  Filled 2014-05-26: qty 1

## 2014-05-26 MED ORDER — SODIUM CHLORIDE 0.9 % IV SOLN: INTRAVENOUS | Status: DC

## 2014-05-26 MED ORDER — ENOXAPARIN SODIUM 100 MG/ML ~~LOC~~ SOLN
95.0000 mg | Freq: Two times a day (BID) | SUBCUTANEOUS | Status: DC
  Administered 2014-05-26 – 2014-05-30 (×8): 95 mg via SUBCUTANEOUS
  Filled 2014-05-26 (×10): qty 1

## 2014-05-26 MED ORDER — CEFAZOLIN SODIUM-DEXTROSE 2-3 GM-% IV SOLR
2.0000 g | Freq: Three times a day (TID) | INTRAVENOUS | Status: DC
  Administered 2014-05-26 – 2014-05-30 (×12): 2 g via INTRAVENOUS
  Filled 2014-05-26 (×15): qty 50

## 2014-05-26 MED ORDER — BUTAMBEN-TETRACAINE-BENZOCAINE 2-2-14 % EX AERO
INHALATION_SPRAY | CUTANEOUS | Status: DC | PRN
Start: 2014-05-26 — End: 2014-05-26
  Administered 2014-05-26: 2 via TOPICAL

## 2014-05-26 MED ORDER — MIDAZOLAM HCL 10 MG/2ML IJ SOLN
INTRAMUSCULAR | Status: DC | PRN
  Administered 2014-05-26: 2 mg via INTRAVENOUS
  Administered 2014-05-26: 1 mg via INTRAVENOUS
  Administered 2014-05-26: 2 mg via INTRAVENOUS

## 2014-05-26 MED ORDER — HYDRALAZINE HCL 20 MG/ML IJ SOLN
INTRAMUSCULAR | Status: AC
  Filled 2014-05-26: qty 1

## 2014-05-26 NOTE — Progress Notes (Signed)
BP 168/112. Dr. Thereasa Solo made aware, new orders recieved

## 2014-05-26 NOTE — CV Procedure (Signed)
Procedure: TEE  Indication: Assess for endocarditis  Sedation: Versed 5 mg IV, Fentanyl 75 mcg IV  Findings: Please see echo section of chart for full report.  Normal LV size with mild global hypokinesis, EF 50%.  Normal RV size and systolic function.  Trivial TR and MR.  No evidence for endocarditis.  There was a catheter that extended into the right atrium almost to the tricuspid valve.  No vegetation noted on catheter. Negative bubble study.   No evidence for endocarditis.  Tamara [name] 05/26/2014 3:41 PM

## 2014-05-26 NOTE — Progress Notes (Signed)
  Echocardiogram Echocardiogram Transesophageal has been performed.  Mauricio Po 05/26/2014, 3:48 PM

## 2014-05-26 NOTE — Progress Notes (Addendum)
[place] for Infectious Disease    Date of Admission:  05/19/2014   Total days of antibiotics 8        Day 6 nafcillin        ID: Tamara [name] is a 43 y.o. female with breast cancer undergoing adjuvent chemo just finished 4th cycle presents with fever, found to have MSSA bacteremia thought be associated with port a cath and DVT now on heparin Principal Problem:   Sepsis Active Problems:   Breast cancer of upper-outer quadrant of left female breast   HTN (hypertension)   Iron deficiency anemia, unspecified   HCAP (healthcare-associated pneumonia)   Sinus tachycardia   AKI (acute kidney injury)   Acute blood loss anemia   Staphylococcal sepsis   DVT (deep venous thrombosis) right IJ/subclavian    Subjective: Afebrile. Underwent TEE which was negative for vegetations. Left hand is less swollen, not tender. Had picc line placed yesterday due to losing piv and piv infiltration  Medications:  . acetaminophen  650 mg Oral Once  . carvedilol  6.25 mg Oral BID WC  . feeding supplement (ENSURE COMPLETE)  237 mL Oral QID  . ferrous sulfate  325 mg Oral TID WC  . hydrALAZINE  10 mg Oral 3 times per day  . lisinopril  5 mg Oral Daily  . nafcillin IV  2 g Intravenous 6 times per day  . pantoprazole  40 mg Oral Daily  . sodium chloride  10-40 mL Intracatheter Q12H  . vitamin C  500 mg Oral TID    Objective: Vital signs in last 24 hours: Temp:  [97.9 F (36.6 C)-99.6 F (37.6 C)] 99.1 F (37.3 C) (06/24 1651) Pulse Rate:  [69-106] 106 (06/24 1719) Resp:  [18-39] 18 (06/24 1651) BP: (140-218)/(73-121) 147/81 mmHg (06/24 1719) SpO2:  [94 %-100 %] 94 % (06/24 1651) Weight:  [207 lb 3.7 oz (94 kg)] 207 lb 3.7 oz (94 kg) (06/24 0419) Physical Exam  Constitutional:  oriented to person, place, and time. appears well-developed and well-nourished.fatigue appearing No distress.  HENT: no thrush Mouth/Throat: Oropharynx is clear and moist. No oropharyngeal exudate.  Cardiovascular:  Normal rate, regular rhythm and normal heart sounds. Exam reveals no gallop and no friction rub.  No murmur heard.  Pulmonary/Chest: Effort normal and breath sounds normal. No respiratory distress.  has no wheezes.  Chest wall= no erythema at portacath removal site, packed but tender to palpation in superior aspect Ext: right dorsum of hand is swollen non erythematous (less than yesterday) Neurological: alert and oriented to person, place, and time.  Skin: Skin is warm and dry. No rash noted. No erythema.   Lab Results  Recent Labs  05/25/14 0330 05/26/14 0530 05/26/14 0601  WBC 10.1 9.8  --   HGB 7.9* 9.3*  --   HCT 24.4* 28.6*  --   NA 143  --  143  K 3.2*  --  3.8  CL 100  --  103  CO2 28  --  28  BUN 3*  --  5*  CREATININE 0.75  --  0.82   Liver Panel  Recent Labs  05/25/14 0330 05/26/14 0601  PROT 6.3 6.8  ALBUMIN 2.3* 2.5*  AST 23 18  ALT 31 25  ALKPHOS 87 92  BILITOT 0.3 0.4    Microbiology: 6/17 blood cx staph aureus:MSSA 6/19 port cx + MSSA 6/20 blood cx NGTD  Assessment/Plan: MSSA bacteremia associated with port a cath infection = 6/20 repeat peripheral blood  cx are NGTD, showing clearance of bacteremia. Port appears to be involved, cx + MSSA. Continue on  nafcillin for MSSA bacteremia x 2 wks. Using 6/20 as day 1 of 14 to end on July 4th.currently on day 5 of 14. For discharge, can change to cefazolin 2gm IV q8hr instead nafcillin  Diarrhea = appears to have loose stools possibly from antibiotics, no need to keep checking for cdifficile.   Port a cath wound = at the end of the IV antibiotics, we will re assess to see if she needs to be transitioned to cephalexin 500mg  QID  Will have her follow up in the ID clinic the week of July 6th to ensure no further needs for antibiotics  Will sign off. Call if questions   Baxter Flattery [facility] for Infectious Diseases Cell: [phone] Pager: [phone]  05/26/2014, 5:20 PM

## 2014-05-26 NOTE — Progress Notes (Signed)
Patient had 8 beat run v tach asymptomatic/ sleeping BP 147/81 HR 84. Dr. Thereasa Solo paged awaiting return call. Oncoming nurse made aware

## 2014-05-26 NOTE — Progress Notes (Signed)
[facility] TEAM 1 - Stepdown/ICU TEAM Progress Note  Tamara [name] BCW:[id number] DOB: Aug 04, 1971 DOA: 05/19/2014 PCP: Angelica Chessman, MD  Admit HPI / Brief Narrative: 43 y.o. 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 She had a CTA of the chest performed as an outpatient which was negative for a pulmonary embolism. She had a temperature to 102.   HPI/Subjective: No new complaints.  Tolerated TEE well.  Assessment/Plan:  Sepsis / MSSA Bacteremia  -Blood cultures x2 positive for Staphylococcus -Continue abx per ID - change to ancef today  -S/P Port-A-Cath removal on 6/19 -after f/u blood cx no growth, PICC placed 6/23 -MRI L-spine negative for abscess -Transesophageal Echocardiogram ruled out vegetation  DVT right internal jugular and subclavian veins. -heparin continues w/o evidence of bleeding at this time  -transition to lovenox in preparation for d/c   Sinus Tachycardia -Resolved -Continue Coreg 3.125 mg  BID    AKI -resolved   HTN -BP control improving - follow w/o change today   Iron deficiency Anemia / acute blood loss anemia vs  Destruction 2dary Chemo? -6/18 transfused 2 unit PRBC -appears patient has mixed anemia  -baseline hemoglobin approximately 10 on 6/9 -Continue Iron Rx -Hgb improving   T2N1cM0 ER PR negative, HER2 positive invasive ductal carcinoma of left breast.  -6/19 S/P removal of Port-A-Cath -Counseled patient and husband most likely Oncology would not restart chemotherapy until infection was resolved.  L-spine pain -MRI ruled out abscess - pain improved   Code Status: FULL Family Communication: no family present at time of exam    Disposition Plan: SDU - possible d/c home in AM 6/25  Consultants: Dr. Evlyn Clines (oncology), Dr. Carlyle Basques (infectious disease) Dr. Greer Pickerel (surgery)  Procedure/Significant Events: 6/17 CXR;Minimal subsegmental atelectasis and/or infiltrate right lung bases;noted recent CT 05/11/2014.    6/19 S./P. Port-A-Cath removal secondary to sepsis 6/19 Doppler right upper extremity - positive for deep vein thrombosis involving the right internal jugular and subclavian veins.  Antibiotics: Cefepime 6/18>> 6/19 Vacomycin 6/18>> 6/20 Nafcillin 6/19>>6/24 Ancef 6/24 >>  DVT prophylaxis: Heparin > lovenox   Objective: VITAL SIGNS: Temp: 98.5 F (36.9 C) (06/24 1157) Temp src: Oral (06/24 1157) BP: 168/112 mmHg (06/24 1223) Pulse Rate: 80 (06/24 1223) SPO2;96 % on RA  Intake/Output Summary (Last 24 hours) at 05/26/14 1356 Last data filed at 05/26/14 0600  Gross per 24 hour  Intake    685 ml  Output      0 ml  Net    685 ml   Exam: General: alert and conversant - no acute resp distress Lungs: Clear to auscultation bilaterally w/o wheeze   Cardiovascular: Regular rate and rhythm without murmur gallop or rub  Abdomen: Nontender, nondistended, soft, bowel sounds positive, no rebound, no ascites, no appreciable mass Extremities: No significant cyanosis, clubbing, or edema bilateral lower extremities - trace edema B UE   Data Reviewed: Basic Metabolic Panel:  Recent Labs Lab 05/22/14 0322 05/23/14 0330 05/24/14 0229 05/25/14 0330 05/26/14 0601  NA 140 143 142 143 143  K 3.6* 3.7 3.2* 3.2* 3.8  CL 103 105 103 100 103  CO2 _0 GLUCOSE 102* 104* 120* 99 102*  BUN 5* 4* 4* 3* 5*  CREATININE 0.89 0.88 0.77 0.75 0.82  CALCIUM 8.4 8.7 8.5 9.0 9.3  MG 1.9 1.8 1.9 1.7 2.1   Liver Function Tests:  Recent Labs Lab 05/22/14 0322 05/23/14 0330 05/24/14 0229 05/25/14 0330 05/26/14 0601  AST _0 ALT 45* 41* 32 31 25  ALKPHOS 114 105 98 87  92  BILITOT 0.6 0.6 0.3 0.3 0.4  PROT 5.9* 6.3 5.9* 6.3 6.8  ALBUMIN 2.1* 2.3* 2.1* 2.3* 2.5*    Recent Labs Lab 05/19/14 1858  LIPASE 39   CBC:  Recent Labs Lab 05/19/14 1858  05/21/14 0812 05/22/14 0322 05/23/14 0330 05/24/14 0229 05/25/14 0330 05/26/14 0530  WBC 28.3*  < > 16.0* 13.9* 11.7* 11.0* 10.1 9.8  NEUTROABS 24.9*  --  13.3* 10.4* 8.5* 7.7  --   --   HGB 8.8*  < > 8.7* 8.1* 8.7* 8.0* 7.9* 9.3*  HCT 26.4*  < > 25.6* 24.4* 26.4* 24.0* 24.4* 28.6*  MCV 92.3  < > 89.8 92.1 93.0 93.8 95.7 93.2  PLT 257  < > 180 197 259 217 235 200  < > = values in this interval not displayed.  Recent Results (from the past 240 hour(s))  CULTURE, BLOOD (ROUTINE X 2)     Status: None   Collection Time    05/19/14  6:55 PM      Result Value Ref Range Status   Specimen Description BLOOD ARM LEFT   Final   Special Requests BOTTLES DRAWN AEROBIC AND ANAEROBIC 5CC   Final   Culture  Setup Time     Final   Value: 05/20/2014 01:19     Performed at [facility]   Culture     Final   Value: STAPHYLOCOCCUS AUREUS     Note: RIFAMPIN AND GENTAMICIN SHOULD NOT BE USED AS SINGLE DRUGS FOR TREATMENT OF STAPH INFECTIONS. This organism is presumed to be Clindamycin resistant based on detection of inducible Clindamycin resistance.     Note: Gram Stain Report Called to,Read Back By and Verified With: DARA A_1 :15PM ON 05/20/14 BY DANTS     Performed at [facility]   Report Status 05/22/2014 FINAL   Final   Organism ID, Bacteria STAPHYLOCOCCUS AUREUS   Final  CULTURE, BLOOD (ROUTINE X 2)     Status: None   Collection Time    05/19/14  7:27 PM      Result Value Ref Range Status   Specimen Description BLOOD ARM LEFT   Final   Special Requests BOTTLES DRAWN AEROBIC AND ANAEROBIC 10CC   Final   Culture  Setup Time     Final   Value: 05/20/2014 01:19     Performed at [facility]   Culture     Final   Value: STAPHYLOCOCCUS AUREUS     Note: SUSCEPTIBILITIES PERFORMED ON  PREVIOUS CULTURE WITHIN THE LAST 5 DAYS.     Note: Gram Stain Report Called to,Read Back By and Verified With: DARA A_2 :15PM ON 05/20/14 BY DANTS     Performed at [facility]   Report Status 05/22/2014 FINAL   Final  URINE CULTURE     Status: None   Collection Time    05/19/14  8:01 PM      Result Value Ref Range Status   Specimen Description URINE, RANDOM   Final   Special Requests NONE   Final   Culture  Setup Time  Final   Value: 05/19/2014 20:34     Performed at [place]     Final   Value: NO GROWTH     Performed at [facility]   Culture     Final   Value: NO GROWTH     Performed at [facility]   Report Status 05/20/2014 FINAL   Final  MRSA PCR SCREENING     Status: None   Collection Time    05/19/14  9:51 PM      Result Value Ref Range Status   MRSA by PCR NEGATIVE  NEGATIVE Final   Comment:            The GeneXpert MRSA Assay (FDA     approved for NASAL specimens     only), is one component of a     comprehensive MRSA colonization     surveillance program. It is not     intended to diagnose MRSA     infection nor to guide or     monitor treatment for     MRSA infections.  CULTURE, ROUTINE-ABSCESS     Status: None   Collection Time    05/21/14  7:40 PM      Result Value Ref Range Status   Specimen Description ABSCESS PORTA CATH EXIT SITE   Final   Special Requests POF VANC MAXIPINE NAFICILLIN   Final   Gram Stain     Final   Value: RARE WBC PRESENT, PREDOMINANTLY PMN     NO ORGANISMS SEEN     Performed at [facility]   Culture     Final   Value: MODERATE STAPHYLOCOCCUS AUREUS     Note: RIFAMPIN AND GENTAMICIN SHOULD NOT BE USED AS SINGLE DRUGS FOR TREATMENT OF STAPH INFECTIONS. This organism is presumed to be Clindamycin resistant based on detection of inducible Clindamycin resistance.     Performed at [facility]   Report Status 05/25/2014 FINAL   Final   Organism ID, Bacteria  STAPHYLOCOCCUS AUREUS   Final  ANAEROBIC CULTURE     Status: None   Collection Time    05/21/14  7:40 PM      Result Value Ref Range Status   Specimen Description ABSCESS PORTA CATH EXIT SITE   Final   Special Requests POF VANC MAXIPINE NAFICILLIN   Final   Gram Stain     Final   Value: RARE WBC PRESENT, PREDOMINANTLY PMN     NO ORGANISMS SEEN     Performed at [facility]   Culture     Final   Value: NO ANAEROBES ISOLATED     Performed at [facility]   Report Status 05/26/2014 FINAL   Final  CULTURE, BLOOD (ROUTINE X 2)     Status: None   Collection Time    05/22/14  1:00 PM      Result Value Ref Range Status   Specimen Description BLOOD LEFT FOREARM   Final   Special Requests BOTTLES DRAWN AEROBIC AND ANAEROBIC 5CC   Final   Culture  Setup Time     Final   Value: 05/22/2014 18:54     Performed at [facility]   Culture     Final   Value:        BLOOD CULTURE RECEIVED NO GROWTH TO DATE CULTURE WILL BE HELD FOR 5 DAYS BEFORE ISSUING A FINAL NEGATIVE REPORT     Performed at [facility]   Report Status PENDING   Incomplete  CULTURE, BLOOD (ROUTINE X 2)     Status: None   Collection Time    05/22/14  2:09 PM      Result Value Ref Range Status   Specimen Description BLOOD RIGHT ARM   Final   Special Requests BOTTLES DRAWN AEROBIC AND ANAEROBIC 10CC   Final   Culture  Setup Time     Final   Value: 05/22/2014 18:54     Performed at [facility]   Culture     Final   Value:        BLOOD CULTURE RECEIVED NO GROWTH TO DATE CULTURE WILL BE HELD FOR 5 DAYS BEFORE ISSUING A FINAL NEGATIVE REPORT     Performed at [facility]   Report Status PENDING   Incomplete  CLOSTRIDIUM DIFFICILE BY PCR     Status: None   Collection Time    05/22/14  2:14 PM      Result Value Ref Range Status   C difficile by pcr NEGATIVE  NEGATIVE Final  CLOSTRIDIUM DIFFICILE BY PCR     Status: None   Collection Time    05/26/14 12:50 AM      Result Value Ref  Range Status   C difficile by pcr NEGATIVE  NEGATIVE Final     Studies:  Recent x-ray studies have been reviewed in detail by the Attending Physician  Scheduled Meds:  Scheduled Meds: . acetaminophen  650 mg Oral Once  . carvedilol  6.25 mg Oral BID WC  . feeding supplement (ENSURE COMPLETE)  237 mL Oral QID  . ferrous sulfate  325 mg Oral TID WC  . hydrALAZINE  10 mg Oral 3 times per day  . lisinopril  5 mg Oral Daily  . nafcillin IV  2 g Intravenous 6 times per day  . pantoprazole  40 mg Oral Daily  . sodium chloride  10-40 mL Intracatheter Q12H  . vitamin C  500 mg Oral TID    Time spent on care of this patient: 25 mins  Cherene Altes, MD [facility] For Consults/Admissions - Flow Manager - [phone] Office  [phone] Pager [phone]  On-Call/Text Page:      [email or web address]      password [facility]  05/26/2014, 1:56 PM   LOS: 7 days

## 2014-05-26 NOTE — Progress Notes (Signed)
ANTICOAGULATION CONSULT NOTE - Follow Up Consult  Pharmacy Consult for lovenox Indication: DVT  No Known Allergies  Patient Measurements: Height: 5\' 5"  (165.1 cm) Weight: 207 lb 3.7 oz (94 kg) IBW/kg (Calculated) : 57 Heparin Dosing Weight:   Vital Signs: Temp: 99.1 F (37.3 C) (06/24 1651) Temp src: Oral (06/24 1651) BP: 147/81 mmHg (06/24 1719) Pulse Rate: 106 (06/24 1719)  Labs:  Recent Labs  05/24/14 0229  05/25/14 0330  05/25/14 1705 05/25/14 2211 05/26/14 0530 05/26/14 0601  HGB 8.0*  --  7.9*  --   --   --  9.3*  --   HCT 24.0*  --  24.4*  --   --   --  28.6*  --   PLT 217  --  235  --   --   --  200  --   HEPARINUNFRC 0.23*  < >  --   < > <0.10* 0.34  --  0.24*  CREATININE 0.77  --  0.75  --   --   --   --  0.82  < > = values in this interval not displayed.  Estimated Creatinine Clearance: 103.4 ml/min (by C-G formula based on Cr of 0.82).   Medications:  Scheduled:  . acetaminophen  650 mg Oral Once  . carvedilol  6.25 mg Oral BID WC  .  ceFAZolin (ANCEF) IV  2 g Intravenous 3 times per day  . feeding supplement (ENSURE COMPLETE)  237 mL Oral QID  . ferrous sulfate  325 mg Oral TID WC  . hydrALAZINE  10 mg Oral 3 times per day  . lisinopril  5 mg Oral Daily  . pantoprazole  40 mg Oral Daily  . sodium chloride  10-40 mL Intracatheter Q12H  . vitamin C  500 mg Oral TID   Infusions:  . sodium chloride 1,000 mL (05/26/14 0600)  . heparin 2,200 Units/hr (05/26/14 1504)    Assessment: 43 yo female with DVT will be transitioned from heparin to lovenox.  Hgb 9.3 and Plt 200 K on 05/26/14; CrCl >100 Goal of Therapy:  Anti-Xa level 0.6-1 units/ml 4hrs after LMWH dose given Monitor platelets by anticoagulation protocol: Yes   Plan:  1) d/c heparin drip and heparin levels 2) Start lovenox 95 mg sq q12h one hour after heparin drip is off 3) CBC every 72 hours  So, Tsz-Yin 05/26/2014,6:51 PM

## 2014-05-26 NOTE — Progress Notes (Signed)
Patient has had several runs of vtach since about 6:30pm, longest was 20 beats.  Patient is asymptomatic, denies pain, bp 147/78.  MD made aware, will continue to monitor

## 2014-05-26 NOTE — Interval H&P Note (Deleted)
History and Physical Interval Note:  05/26/2014 3:20 PM  Tamara [name]  has presented today for surgery, with the diagnosis of Endocarditis  The various methods of treatment have been discussed with the patient and family. After consideration of risks, benefits and other options for treatment, the patient has consented to  Procedure(s): TRANSESOPHAGEAL ECHOCARDIOGRAM (TEE) (N/A) as a surgical intervention .  [name] history has been reviewed, patient examined, no change in status, stable for surgery.  I have reviewed the patient's chart and labs.  Questions were answered to the patient's satisfaction.     [place] [employer]

## 2014-05-26 NOTE — H&P (View-Only) (Deleted)
[facility] for Infectious Disease    Date of Admission:  05/19/2014   Total days of antibiotics 7        Day 5 nafcillin        ID: Tamara [name] is a 43 y.o. female with breast cancer undergoing adjuvent chemo just finished 4th cycle presents with fever, found to have MSSA bacteremia thought be associated with port a cath and DVT now on heparin Principal Problem:   Sepsis Active Problems:   Breast cancer of upper-outer quadrant of left female breast   HTN (hypertension)   Iron deficiency anemia, unspecified   HCAP (healthcare-associated pneumonia)   Sinus tachycardia   AKI (acute kidney injury)   Acute blood loss anemia   Staphylococcal sepsis   DVT (deep venous thrombosis) right IJ/subclavian    Subjective: Afebrile x 24hr.TEE postponed today. Noticing swelling to dorsum of left hand non tender, concern about her piv infiltrating. stil mildly tender at portacath removal site  Medications:  . carvedilol  3.125 mg Oral BID WC  . feeding supplement (ENSURE COMPLETE)  237 mL Oral BID BM  . ferrous sulfate  325 mg Oral TID WC  . hydrALAZINE  10 mg Oral 3 times per day  . nafcillin IV  2 g Intravenous 6 times per day  . pantoprazole  40 mg Oral Daily  . potassium chloride  40 mEq Oral BID  . vitamin C  500 mg Oral TID    Objective: Vital signs in last 24 hours: Temp:  [98.3 F (36.8 C)-99.2 F (37.3 C)] 98.6 F (37 C) (06/23 0800) Pulse Rate:  [75-86] 79 (06/23 0816) Resp:  [18-22] 22 (06/23 0800) BP: (159-219)/(79-139) 177/79 mmHg (06/23 0816) SpO2:  [93 %-100 %] 96 % (06/23 0800) Weight:  [208 lb 5.4 oz (94.5 kg)] 208 lb 5.4 oz (94.5 kg) (06/23 0343) Physical Exam  Constitutional:  oriented to person, place, and time. appears well-developed and well-nourished.fatigue appearing No distress.  HENT:  Mouth/Throat: Oropharynx is clear and moist. No oropharyngeal exudate.  Cardiovascular: Normal rate, regular rhythm and normal heart sounds. Exam reveals no gallop and  no friction rub.  No murmur heard.  Pulmonary/Chest: Effort normal and breath sounds normal. No respiratory distress.  has no wheezes.  Chest wall= no erythema at portacath removal site, packed but tender to palpation in superior aspect Ext: right dorsum of hand is swollen non erythematous Neurological: alert and oriented to person, place, and time.  Skin: Skin is warm and dry. No rash noted. No erythema.   Lab Results  Recent Labs  05/24/14 0229 05/25/14 0330  WBC 11.0* 10.1  HGB 8.0* 7.9*  HCT 24.0* 24.4*  NA 142 143  K 3.2* 3.2*  CL 103 100  CO2 25 28  BUN 4* 3*  CREATININE 0.77 0.75   Liver Panel  Recent Labs  05/24/14 0229 05/25/14 0330  PROT 5.9* 6.3  ALBUMIN 2.1* 2.3*  AST 17 23  ALT 32 31  ALKPHOS 98 87  BILITOT 0.3 0.3   Sedimentation Rate No results found for this basename: ESRSEDRATE,  in the last 72 hours C-Reactive Protein No results found for this basename: CRP,  in the last 72 hours  Microbiology: 6/17 blood cx staph aureus:MSSA 6/19 port cx + MSSA 6/20 blood cx NGTD  Studies/Results: No results found.   Assessment/Plan: MSSA bacteremia = 6/20 repeat peripheral blood cx are NGTD, showing clearance of bacteremia. Port appears to be involved, cx + MSSA. Continue on  nafcillin  for MSSA bacteremia  - recommend to get TEE to help decide if she will get 2wk vs. 6 wk of therapy  IV access= can have  picc line placed today for iv antibiotics and heparin    SNIDER, [facility] Cell: [phone] Pager: [phone]  05/25/2014, 12:28 PM

## 2014-05-27 ENCOUNTER — Other Ambulatory Visit: Payer: Medicaid Other

## 2014-05-27 ENCOUNTER — Encounter (HOSPITAL_COMMUNITY): Payer: Self-pay | Admitting: Cardiology

## 2014-05-27 ENCOUNTER — Ambulatory Visit: Payer: Medicaid Other

## 2014-05-27 ENCOUNTER — Other Ambulatory Visit: Payer: Self-pay | Admitting: Oncology

## 2014-05-27 ENCOUNTER — Ambulatory Visit: Payer: Medicaid Other | Admitting: Adult Health

## 2014-05-27 DIAGNOSIS — I82C29 Chronic embolism and thrombosis of unspecified internal jugular vein: Secondary | ICD-10-CM

## 2014-05-27 DIAGNOSIS — I82B19 Acute embolism and thrombosis of unspecified subclavian vein: Secondary | ICD-10-CM

## 2014-05-27 DIAGNOSIS — M25519 Pain in unspecified shoulder: Secondary | ICD-10-CM

## 2014-05-27 DIAGNOSIS — C50412 Malignant neoplasm of upper-outer quadrant of left female breast: Secondary | ICD-10-CM

## 2014-05-27 LAB — CBC
HCT: 27 % — ABNORMAL LOW (ref 36.0–46.0)
Hemoglobin: 8.8 g/dL — ABNORMAL LOW (ref 12.0–15.0)
MCH: 30.7 pg (ref 26.0–34.0)
MCHC: 32.6 g/dL (ref 30.0–36.0)
MCV: 94.1 fL (ref 78.0–100.0)
PLATELETS: 184 10*3/uL (ref 150–400)
RBC: 2.87 MIL/uL — ABNORMAL LOW (ref 3.87–5.11)
RDW: 20.7 % — AB (ref 11.5–15.5)
WBC: 7.9 10*3/uL (ref 4.0–10.5)

## 2014-05-27 LAB — BASIC METABOLIC PANEL
BUN: 5 mg/dL — ABNORMAL LOW (ref 6–23)
CALCIUM: 9 mg/dL (ref 8.4–10.5)
CO2: 26 meq/L (ref 19–32)
CREATININE: 0.8 mg/dL (ref 0.50–1.10)
Chloride: 102 mEq/L (ref 96–112)
GFR calc Af Amer: >90 mL/min (ref >90)
GLUCOSE: 98 mg/dL (ref 70–99)
Potassium: 3.4 mEq/L — ABNORMAL LOW (ref 3.7–5.3)
Sodium: 142 mEq/L (ref 137–147)

## 2014-05-27 MED ORDER — HYDRALAZINE HCL 20 MG/ML IJ SOLN
10.0000 mg | Freq: Once | INTRAMUSCULAR | Status: AC
  Administered 2014-05-27: 10 mg via INTRAVENOUS

## 2014-05-27 MED ORDER — HYDRALAZINE HCL 20 MG/ML IJ SOLN
10.0000 mg | Freq: Four times a day (QID) | INTRAMUSCULAR | Status: DC | PRN
  Administered 2014-05-27: 10 mg via INTRAVENOUS
  Filled 2014-05-27: qty 1

## 2014-05-27 MED ORDER — HYDRALAZINE HCL 20 MG/ML IJ SOLN
20.0000 mg | Freq: Four times a day (QID) | INTRAMUSCULAR | Status: DC | PRN
  Administered 2014-05-28: 20 mg via INTRAVENOUS
  Filled 2014-05-27 (×3): qty 1

## 2014-05-27 MED ORDER — HYDROCHLOROTHIAZIDE 25 MG PO TABS: 25.0000 mg | ORAL_TABLET | Freq: Every day | ORAL | Status: DC

## 2014-05-27 MED ORDER — LISINOPRIL 20 MG PO TABS
20.0000 mg | ORAL_TABLET | Freq: Every day | ORAL | Status: DC
  Administered 2014-05-28: 20 mg via ORAL
  Filled 2014-05-27: qty 1

## 2014-05-27 NOTE — Progress Notes (Signed)
[facility] TEAM 1 - Stepdown/ICU TEAM Progress Note  Tamara [name] RZN:[id number] DOB: 05/23/1971 DOA: 05/19/2014 PCP: Angelica Chessman, MD  Admit HPI / Brief Narrative: Tamara [name] is a 43 y.o. 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 She has also been having chest discomfort x 1 week and had a CTA of the Chest performed as an outpatient which was negative for a Pulmonary embolism. She had a temperature to 102 today.    HPI/Subjective: 6/25 neg SOB/CP, cont. pain around site of Port-A-Cath removal, (-)SOB   Assessment/Plan:  Sepsis/HCAP -Blood cultures x2 positive for Staphylococcus, awaiting sensitivities -Continue IV Vancomycin and Cefepime, and Albuterol nebs, O2 PRN, and IVFs.  -6/19 after speaking with Dr. Evlyn Clines (oncology) on 6/19,Dr. Evlyn Clines (oncology) agreed Port-A-Cath needed removal -Transesophageal Echocardiogram ruled out vegetation -Continue Ancef per infectious disease -Continue vancomycin+ nafcillin per infectious disease  -MRI L-spine negative for abscess -Transesophageal Echocardiogram rule out vegetation Pending -6/23 repeat blood culture x2 NGTD patient has lost access for the second time, will have PICC line placed   DVT right internal jugular and subclavian veins. -Continue heparin  -Continue to Monitor closely for bleeding/hematoma right chest wall under surgical site  Sinus Tachycardia -Resolved -Increase Coreg 6.25 mg  BID    AKI -Resolved  HTN -Uncontrolled Continue Coreg 6.25 mg BID -Increase lisinopril 20 mg daily  Iron deficiency Anemia/acute blood loss anemia vs  Destruction 2dary Chemo? -6/18 transfuse 2  unit PRBC Occult Blood  -Anemia workup; appears patient has mixed anemia  -Patient's baseline hemoglobin approximately 10 last taken on 6/9. currently at 8.8 stable since transfusion - Continue Iron Rx. 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  -6/19 S./P. removal of Port-A-Cath -  Counseled patient and husband most likely oncology would not restart chemotherapy until infection was resolved. -Double-lumen midline PICC placed 6/23  Neck/right shoulder pain -Positive for DVT -See DVT Right IJ/subclavian  L-spine pain -MRI rule out abscess; Normal MRI of the lumbar spine.       Code Status: FULL Family Communication: Mother present  Disposition Plan: Resolution sepsis;      Consultants: Dr. Evlyn Clines (oncology), Dr. Carlyle Basques (infectious disease) Dr. Greer Pickerel (surgery)   Procedure/Significant Events: 6/17 CXR;Minimal subsegmental atelectasis and/or infiltrate right lung bases;noted recent CT 05/11/2014.    6/19 S./P. Port-A-Cath removal secondary to sepsis 6/19 Doppler right upper extremity;positive for deep vein thrombosis involving the right internal jugular and subclavian veins. 6/23 double-lumen midline PICC placed    Culture 6/17 BLOOD ARM LEFT x 2; POSITIVE GRAM  COCCI IN CLUSTERS, MSSA  6/17 Urine negative (final)   6/19 blood Port-A-Cath NGTD  6/20 blood left forearm/right arm NGTD 6/20 C. difficile by PCR negative   Antibiotics: Cefepime 6/18>> stopped 6/19 Vacomycin 6/18>> stopped 6/20 Nafcillin 6/19>> stopped 6/24 Ancef 6/24>>    DVT prophylaxis: Lovenox   Devices Right chest wall Port-A-Cath>> removed 6/19     LINES / TUBES:  6/17 22 ga right hand>> infiltrated 6/23 6/17 ga left antecubital>> infiltrated 6/23 6/23 double-lumen midline PICC placed   Continuous Infusions: . sodium chloride 1,000 mL (05/27/14 0005)  Objective: VITAL SIGNS: Temp: 98.4 F  (36.9 C) (06/25 1157) Temp src: Oral (06/25 1157) BP: 150/95 mmHg (06/25 1157) Pulse Rate: 84 (06/25 1157) SPO2;96 % on RA FIO2:   Intake/Output Summary (Last 24 hours) at 05/27/14 1231 Last data filed at 05/27/14 1033  Gross per 24 hour  Intake   1291 ml  Output   1000 ml  Net    291 ml     Exam: General: A./O. x4, NAD, continued tenderness to palpation at incision site where Port-A-Cath removed . Lungs: Clear to auscultation bilaterally Cardiovascular: Regular rate and rhythm without murmur gallop or rub normal S1 and S2, Abdomen: Nontender, nondistended, soft, bowel sounds positive, no rebound, no ascites, no appreciable mass Extremities: No significant cyanosis, clubbing, or edema bilateral lower extremities  Data Reviewed: Basic Metabolic Panel:  Recent Labs Lab 05/22/14 0322 05/23/14 0330 05/24/14 0229 05/25/14 0330 05/26/14 0601 05/27/14 0213  NA 140 143 142 143 143 142  K 3.6* 3.7 3.2* 3.2* 3.8 3.4*  CL 103 105 103 100 103 102  CO2 20 24 25 28 28 26   GLUCOSE 102* 104* 120* 99 102* 98  BUN 5* 4* 4* 3* 5* 5*  CREATININE 0.89 0.88 0.77 0.75 0.82 0.80  CALCIUM 8.4 8.7 8.5 9.0 9.3 9.0  MG 1.9 1.8 1.9 1.7 2.1  --    Liver Function Tests:  Recent Labs Lab 05/22/14 0322 05/23/14 0330 05/24/14 0229 05/25/14 0330 05/26/14 0601  AST 16 23 17 23 18   ALT 45* 41* 32 31 25  ALKPHOS 114 105 98 87 92  BILITOT 0.6 0.6 0.3 0.3 0.4  PROT 5.9* 6.3 5.9* 6.3 6.8  ALBUMIN 2.1* 2.3* 2.1* 2.3* 2.5*   No results found for this basename: LIPASE, AMYLASE,  in the last 168 hours No results found for this basename: AMMONIA,  in the last 168 hours CBC:  Recent Labs Lab 05/21/14 0812 05/22/14 0322 05/23/14 0330 05/24/14 0229 05/25/14 0330 05/26/14 0530 05/27/14 0500  WBC 16.0* 13.9* 11.7* 11.0* 10.1 9.8 7.9  NEUTROABS 13.3* 10.4* 8.5* 7.7  --   --   --   HGB 8.7* 8.1* 8.7* 8.0* 7.9* 9.3* 8.8*  HCT 25.6* 24.4* 26.4* 24.0* 24.4* 28.6* 27.0*  MCV 89.8 92.1 93.0 93.8  95.7 93.2 94.1  PLT 180 197 259 217 235 200 184   Cardiac Enzymes: No results found for this basename: CKTOTAL, CKMB, CKMBINDEX, TROPONINI,  in the last 168 hours BNP (last 3 results)  Recent Labs  05/11/14 1318  PROBNP 173.9*   CBG: No results found for this basename: GLUCAP,  in the last 168 hours  Recent Results (from the past 240 hour(s))  CULTURE, BLOOD (ROUTINE X 2)     Status: None   Collection Time    05/19/14  6:55 PM      Result Value Ref Range Status   Specimen Description BLOOD ARM LEFT   Final   Special Requests BOTTLES DRAWN AEROBIC AND ANAEROBIC 5CC   Final   Culture  Setup Time     Final   Value: 05/20/2014 01:19     Performed at [facility]   Culture     Final   Value: STAPHYLOCOCCUS AUREUS     Note: RIFAMPIN AND GENTAMICIN SHOULD NOT BE USED AS SINGLE DRUGS FOR TREATMENT OF STAPH INFECTIONS. This organism is presumed to be Clindamycin resistant based on detection of inducible Clindamycin resistance.     Note: Gram Stain Report Called to,Read Back By and Verified  With: DARA A@1 :15PM ON 05/20/14 BY DANTS     Performed at [facility]   Report Status 05/22/2014 FINAL   Final   Organism ID, Bacteria STAPHYLOCOCCUS AUREUS   Final  CULTURE, BLOOD (ROUTINE X 2)     Status: None   Collection Time    05/19/14  7:27 PM      Result Value Ref Range Status   Specimen Description BLOOD ARM LEFT   Final   Special Requests BOTTLES DRAWN AEROBIC AND ANAEROBIC 10CC   Final   Culture  Setup Time     Final   Value: 05/20/2014 01:19     Performed at [facility]   Culture     Final   Value: STAPHYLOCOCCUS AUREUS     Note: SUSCEPTIBILITIES PERFORMED ON PREVIOUS CULTURE WITHIN THE LAST 5 DAYS.     Note: Gram Stain Report Called to,Read Back By and Verified With: DARA A@1 :15PM ON 05/20/14 BY DANTS     Performed at [facility]   Report Status 05/22/2014 FINAL   Final  URINE CULTURE     Status: None   Collection Time    05/19/14  8:01 PM       Result Value Ref Range Status   Specimen Description URINE, RANDOM   Final   Special Requests NONE   Final   Culture  Setup Time     Final   Value: 05/19/2014 20:34     Performed at [facility]     Final   Value: NO GROWTH     Performed at [facility]   Culture     Final   Value: NO GROWTH     Performed at [facility]   Report Status 05/20/2014 FINAL   Final  MRSA PCR SCREENING     Status: None   Collection Time    05/19/14  9:51 PM      Result Value Ref Range Status   MRSA by PCR NEGATIVE  NEGATIVE Final   Comment:            The GeneXpert MRSA Assay (FDA     approved for NASAL specimens     only), is one component of a     comprehensive MRSA colonization     surveillance program. It is not     intended to diagnose MRSA     infection nor to guide or     monitor treatment for     MRSA infections.  CULTURE, ROUTINE-ABSCESS     Status: None   Collection Time    05/21/14  7:40 PM      Result Value Ref Range Status   Specimen Description ABSCESS PORTA CATH EXIT SITE   Final   Special Requests POF VANC MAXIPINE NAFICILLIN   Final   Gram Stain     Final   Value: RARE WBC PRESENT, PREDOMINANTLY PMN     NO ORGANISMS SEEN     Performed at [facility]   Culture     Final   Value: MODERATE STAPHYLOCOCCUS AUREUS     Note: RIFAMPIN AND GENTAMICIN SHOULD NOT BE USED AS SINGLE DRUGS FOR TREATMENT OF STAPH INFECTIONS. This organism is presumed to be Clindamycin resistant based on detection of inducible Clindamycin resistance.     Performed at [facility]   Report Status 05/25/2014 FINAL   Final   Organism ID, Bacteria STAPHYLOCOCCUS AUREUS   Final  ANAEROBIC CULTURE  Status: None   Collection Time    05/21/14  7:40 PM      Result Value Ref Range Status   Specimen Description ABSCESS PORTA CATH EXIT SITE   Final   Special Requests POF VANC MAXIPINE NAFICILLIN   Final   Gram Stain     Final   Value: RARE WBC PRESENT,  PREDOMINANTLY PMN     NO ORGANISMS SEEN     Performed at [facility]   Culture     Final   Value: NO ANAEROBES ISOLATED     Performed at [facility]   Report Status 05/26/2014 FINAL   Final  CULTURE, BLOOD (ROUTINE X 2)     Status: None   Collection Time    05/22/14  1:00 PM      Result Value Ref Range Status   Specimen Description BLOOD LEFT FOREARM   Final   Special Requests BOTTLES DRAWN AEROBIC AND ANAEROBIC 5CC   Final   Culture  Setup Time     Final   Value: 05/22/2014 18:54     Performed at [facility]   Culture     Final   Value:        BLOOD CULTURE RECEIVED NO GROWTH TO DATE CULTURE WILL BE HELD FOR 5 DAYS BEFORE ISSUING A FINAL NEGATIVE REPORT     Performed at [facility]   Report Status PENDING   Incomplete  CULTURE, BLOOD (ROUTINE X 2)     Status: None   Collection Time    05/22/14  2:09 PM      Result Value Ref Range Status   Specimen Description BLOOD RIGHT ARM   Final   Special Requests BOTTLES DRAWN AEROBIC AND ANAEROBIC 10CC   Final   Culture  Setup Time     Final   Value: 05/22/2014 18:54     Performed at [facility]   Culture     Final   Value:        BLOOD CULTURE RECEIVED NO GROWTH TO DATE CULTURE WILL BE HELD FOR 5 DAYS BEFORE ISSUING A FINAL NEGATIVE REPORT     Performed at [facility]   Report Status PENDING   Incomplete  CLOSTRIDIUM DIFFICILE BY PCR     Status: None   Collection Time    05/22/14  2:14 PM      Result Value Ref Range Status   C difficile by pcr NEGATIVE  NEGATIVE Final  CLOSTRIDIUM DIFFICILE BY PCR     Status: None   Collection Time    05/26/14 12:50 AM      Result Value Ref Range Status   C difficile by pcr NEGATIVE  NEGATIVE Final     Studies:  Recent x-ray studies have been reviewed in detail by the Attending Physician  Scheduled Meds:  Scheduled Meds: . acetaminophen  650 mg Oral Once  . carvedilol  6.25 mg Oral BID WC  .  ceFAZolin (ANCEF) IV  2 g Intravenous 3  times per day  . enoxaparin (LOVENOX) injection  95 mg Subcutaneous Q12H  . feeding supplement (ENSURE COMPLETE)  237 mL Oral QID  . ferrous sulfate  325 mg Oral TID WC  . hydrALAZINE  10 mg Oral 3 times per day  . lisinopril  5 mg Oral Daily  . pantoprazole  40 mg Oral Daily  . sodium chloride  10-40 mL Intracatheter Q12H  . vitamin C  500 mg Oral TID  Time spent on care of this patient: 40 mins   Allie Bossier , MD   [facility] Office  [phone] Pager [phone]  On-Call/Text Page:      [email or web address]      password [email or web address]  If 7PM-7AM, please contact night-coverage [email or web address] Password [facility] 05/27/2014, 12:31 PM   LOS: 8 days

## 2014-05-27 NOTE — Progress Notes (Signed)
NUTRITION FOLLOW UP  DOCUMENTATION CODES Per approved criteria  -Obesity Unspecified   INTERVENTION:  Continue Ensure Complete po BID, each supplement provides 350 kcal and 13 grams of protein RD to follow for nutrition care plan  NUTRITION DIAGNOSIS: Inadequate oral intake related to infection/poor appetite as evidenced by meal completion < 50%, improved   Goal: Pt to meet >/= 90% of their estimated nutrition needs, progressing  Monitor:  PO intake, supplement acceptance, weight trend, labs   ASSESSMENT: Pt with hx of breast ca dx 3/15 currently on chemo. Last chemo 05/13/14. Pt admitted with fevers, now s/p removal of port-a-cath secondary to port site infection and bacteremia.   Patient s/p procedure 6/19: REMOVAL PORT-A-CATH  Patient s/p TEE 6/24.  Reports her appetite is better.  Ate some breakfast this AM.  PO intake variable at 20-75% per flowsheet records.  Drinking her Ensure Complete supplements.  Height: Ht Readings from Last 1 Encounters:  05/19/14 5' 5"  (1.651 m)    Weight: Wt Readings from Last 1 Encounters:  05/27/14 200 lb 9.9 oz (91 kg)    BMI:  Body mass index is 33.38 kg/(m^2).  Estimated Nutritional Needs: Kcal: 1900-2100 Protein: 95-115 grams Fluid: >1.9 L/day  Skin: right chest incision  Diet Order: General   Intake/Output Summary (Last 24 hours) at 05/27/14 1026 Last data filed at 05/27/14 0605  Gross per 24 hour  Intake   1151 ml  Output   1000 ml  Net    151 ml   Labs:   Recent Labs Lab 05/24/14 0229 05/25/14 0330 05/26/14 0601 05/27/14 0213  NA 142 143 143 142  K 3.2* 3.2* 3.8 3.4*  CL 103 100 103 102  CO2 25 28 28 26   BUN 4* 3* 5* 5*  CREATININE 0.77 0.75 0.82 0.80  CALCIUM 8.5 9.0 9.3 9.0  MG 1.9 1.7 2.1  --   GLUCOSE 120* 99 102* 98    Scheduled Meds: . acetaminophen  650 mg Oral Once  . carvedilol  6.25 mg Oral BID WC  .  ceFAZolin (ANCEF) IV  2 g Intravenous 3 times per day  . enoxaparin (LOVENOX)  injection  95 mg Subcutaneous Q12H  . feeding supplement (ENSURE COMPLETE)  237 mL Oral QID  . ferrous sulfate  325 mg Oral TID WC  . hydrALAZINE  10 mg Oral 3 times per day  . lisinopril  5 mg Oral Daily  . pantoprazole  40 mg Oral Daily  . sodium chloride  10-40 mL Intracatheter Q12H  . vitamin C  500 mg Oral TID    Continuous Infusions: . sodium chloride 1,000 mL (05/27/14 0005)    Past Medical History  Diagnosis Date  . Endometriosis   . Hypertension   . Rash     fine rash on abd  . Anemia   . Wears glasses   . 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 Iron deficiency anemia, unspecified 02/25/2014  . BRCA1 positive     c.190T>G (p.Cys64Gly) @ [facility]     Past Surgical History  Procedure Laterality Date  . Cervical polypectomy  2010  . Cesarean section      one previous  . Tubal ligation    . Portacath placement Right 02/23/2014    Procedure: INSERTION PORT-A-CATH;  Surgeon: Joyice Faster. Cornett, MD;  Location: [facility];  Service: General;  Laterality: Right;  . Port-a-cath removal N/A 05/21/2014    Procedure: REMOVAL PORT-A-CATH;  Surgeon: Zenovia Jarred, MD;  Location: [facility] OR;  Service: General;  Laterality: N/A;  . Tee without cardioversion N/A 05/26/2014    Procedure: TRANSESOPHAGEAL ECHOCARDIOGRAM (TEE);  Surgeon: Larey Dresser, MD;  Location: [facility];  Service: Cardiovascular;  Laterality: N/A;    Arthur Holms, RD, LDN Pager #: [phone] After-Hours Pager #: [phone]

## 2014-05-27 NOTE — Progress Notes (Signed)
05/27/2014, 12:24 PM  Hospital day 9 Antibiotics: day 7/14 nafcillin (to change to cefazolin at DC), to complete 06-05-14. Chemotherapy: cycle 4 neoadjuvant carboplatin/taxotere/herceptin/pertuzumab given 05-13-14. Cycle 5 due 05-27-14 on hold due to treatment of staph bacteremia. Short run asymptomatic VT by monitor early this AM, none since.  EMR reviewed. I have also spoken with RN on unit and with Dr Baxter Flattery by phone now. TEE negative for vegetations 2014/06/23. 1 unit PRBCs given 05-25-14. PICC in LUE  Subjective: Feeling some better, finally taste improving slightly and she was able to eat some of breakfast. PICC not uncomfortable. Right neck much less sore. No bleeding, still on full anticoagulation for right jugular DVT. Site of prior PAC is uncomfortable with packing now. Able to go to BR instead of BSC today. Less swelling left hand from IV infiltration. Slight nausea this AM. BM x1 today, still loose.   Objective: Vital signs in last 24 hours: Blood pressure 150/95, pulse 84, temperature 98.4 F (36.9 C), temperature source Oral, resp. rate 18, height _0  (1.651 m), weight 200 lb 9.9 oz (91 kg), last menstrual period 02/19/2014, SpO2 96.00%.   Intake/Output from previous day: 06-24-23 0701 - 06/25 0700 In: 5573 [P.O.:530; I.V.:621] Out: 1000 [Urine:1000] Intake/Output this shift: Total I/O In: 140 [P.O.:120; I.V.:20] Out: -   Physical exam: Awake, alert, appropriate. Looks more comfortable, sitting in bed on RA. Mucous membranes/ conjunctivae still pale. Oral mucosa clear. PERRL, not icteric. Lungs clear ant/post. PICC site ok. 1+ swelling left hand, improved. Heart RRR. Abdomen soft, quiet, not tender. LE no edema, cords, tenderness. Skin without rash or ecchymosis. Moves extremities easily in bed. Mood and affect appropriate.  Lab Results:  Recent Labs  June 23, 2014 0530 05/27/14 0500  WBC 9.8 7.9  HGB 9.3* 8.8*  HCT 28.6* 27.0*  PLT 200 184   BMET  Recent Labs  06-23-14 0601 05/27/14 0213  NA 143 142  K 3.8 3.4*  CL 103 102  CO2 28 26  GLUCOSE 102* 98  BUN 5* 5*  CREATININE 0.82 0.80  CALCIUM 9.3 9.0   PAC exit site cx from 05-21-14 also with MSSA  Studies/Results: TEE report from 06-24-2023 reviewed   Discussed timing of resuming neoadjuvant chemotherapy with Dr Baxter Flattery now. If patient is stable by 06-03-14, Dr Baxter Flattery would be comfortable with chemotherapy cycle 5 being given then. I have sent request for appointments lab, this MD, chemo to my office now, as well as neulasta + IVF on 7-4 if possible [facility] is closed 7-3 but infusion open on 06-05-14.) Discussed chemo as above with patient now, as well as problems with previous cycles (severe nausea and inadequate po's). Plan is 6 cycles of neoadjuvant chemotherapy, repeat breast MRI and surgery for the ER PR negative HER2 positive left breast cancer.  Assessment/Plan: 1.Methicillin sensitive staph aureus sepsis from [facility] infection: PAC removed 05-21-14, continuing nafcillin, TEE negative. ID has signed off in hospital but want to follow up in their clinic week of July 6 to be sure no more antibiotics needed. 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 Due cycle 5 on 05-27-14, which likely will need to delay at least a week depending on status of the staph infection. Plan repeat breast MRI after neoadjuvant chemo completes, then left breast surgery.  3.difficult peripheral venous access:  for PICC today as repeat blood cultures still negative.  4.Right IJ and subclavian vein DVT: on heparin by pharmacy to convert to lovenox. Need to be sure she can get lovenox on insurance at DC. 5.diarrhea: C diff negative  6.po intake: a little better. Still needs Ensure, as likely  po's will be more difficult after chemo again next week. 7.anemia: multifactorial, post IV iron at [facility] and transfused 2 u PRBCs on admission and 1 unit 6-23. 8.apparently overdue gyn exam. Note uptake in endometrial canal on PET, possibly related to phase of ovulation.  9.back pain improved, normal LS MRI  Please call if needed prior to my next rounds. Thanks  LIVESAY,LENNIS P [phone]

## 2014-05-27 NOTE — Progress Notes (Addendum)
Pt BP has been greater than 170 sys for over the last hour. Currently the BP is 184/98. There are no BP prn available for pt . Paged T. Rogue Bussing, NP, Awaiting response.   T.Callahan placed new orders for prn BP medications.  Pt BP 212/77 , Pt c/o of headache. Will continue to monitor , Notified T.Rogue Bussing, Np of pt status . Will continue to monitor  Pt having multiple runs of V-Tach, and PVCs frequently. Pt asymptomatic. Notified T.Rogue Bussing. New orders placed for Labs in the AM

## 2014-05-28 ENCOUNTER — Inpatient Hospital Stay (HOSPITAL_COMMUNITY): Payer: Medicaid Other

## 2014-05-28 ENCOUNTER — Other Ambulatory Visit: Payer: Self-pay | Admitting: Oncology

## 2014-05-28 ENCOUNTER — Telehealth: Payer: Self-pay | Admitting: *Deleted

## 2014-05-28 ENCOUNTER — Telehealth: Payer: Self-pay | Admitting: Oncology

## 2014-05-28 ENCOUNTER — Ambulatory Visit: Payer: Medicaid Other

## 2014-05-28 LAB — BASIC METABOLIC PANEL
BUN: 5 mg/dL — ABNORMAL LOW (ref 6–23)
CALCIUM: 9.3 mg/dL (ref 8.4–10.5)
CO2: 26 mEq/L (ref 19–32)
Chloride: 100 mEq/L (ref 96–112)
Creatinine, Ser: 0.76 mg/dL (ref 0.50–1.10)
GFR calc non Af Amer: >90 mL/min (ref >90)
Glucose, Bld: 108 mg/dL — ABNORMAL HIGH (ref 70–99)
Potassium: 3.2 mEq/L — ABNORMAL LOW (ref 3.7–5.3)
SODIUM: 141 meq/L (ref 137–147)

## 2014-05-28 LAB — CULTURE, BLOOD (ROUTINE X 2)
CULTURE: NO GROWTH
Culture: NO GROWTH

## 2014-05-28 LAB — MAGNESIUM: MAGNESIUM: 1.8 mg/dL (ref 1.5–2.5)

## 2014-05-28 LAB — LIPASE, BLOOD: Lipase: 56 U/L (ref 11–59)

## 2014-05-28 LAB — HEPATIC FUNCTION PANEL
ALT: 13 U/L (ref 0–35)
AST: 12 U/L (ref 0–37)
Albumin: 2.5 g/dL — ABNORMAL LOW (ref 3.5–5.2)
Alkaline Phosphatase: 81 U/L (ref 39–117)
BILIRUBIN TOTAL: 0.3 mg/dL (ref 0.3–1.2)
Bilirubin, Direct: <0.2 mg/dL (ref 0.0–0.3)
Total Protein: 7.3 g/dL (ref 6.0–8.3)

## 2014-05-28 MED ORDER — LORAZEPAM 2 MG/ML IJ SOLN
1.0000 mg | Freq: Four times a day (QID) | INTRAMUSCULAR | Status: DC | PRN
  Administered 2014-05-28: 1 mg via INTRAVENOUS
  Filled 2014-05-28: qty 1

## 2014-05-28 MED ORDER — CARVEDILOL 12.5 MG PO TABS
12.5000 mg | ORAL_TABLET | Freq: Two times a day (BID) | ORAL | Status: DC
  Administered 2014-05-28 – 2014-05-29 (×2): 12.5 mg via ORAL
  Filled 2014-05-28 (×4): qty 1

## 2014-05-28 MED ORDER — CLONIDINE HCL 0.1 MG PO TABS
0.1000 mg | ORAL_TABLET | ORAL | Status: DC | PRN
  Administered 2014-05-28: 0.1 mg via ORAL
  Filled 2014-05-28: qty 1

## 2014-05-28 MED ORDER — LISINOPRIL 20 MG PO TABS
20.0000 mg | ORAL_TABLET | Freq: Once | ORAL | Status: AC
  Administered 2014-05-28: 20 mg via ORAL
  Filled 2014-05-28: qty 1

## 2014-05-28 MED ORDER — LABETALOL HCL 5 MG/ML IV SOLN
10.0000 mg | INTRAVENOUS | Status: DC | PRN
  Administered 2014-05-28 (×2): 10 mg via INTRAVENOUS
  Filled 2014-05-28 (×2): qty 4

## 2014-05-28 MED ORDER — ONDANSETRON HCL 4 MG/2ML IJ SOLN
4.0000 mg | Freq: Four times a day (QID) | INTRAMUSCULAR | Status: DC | PRN
  Administered 2014-05-28 (×2): 4 mg via INTRAVENOUS
  Filled 2014-05-28 (×2): qty 2

## 2014-05-28 MED ORDER — HYDRALAZINE HCL 25 MG PO TABS
25.0000 mg | ORAL_TABLET | Freq: Three times a day (TID) | ORAL | Status: DC
  Administered 2014-05-28: 25 mg via ORAL
  Filled 2014-05-28 (×3): qty 1

## 2014-05-28 MED ORDER — HYDRALAZINE HCL 50 MG PO TABS
50.0000 mg | ORAL_TABLET | Freq: Three times a day (TID) | ORAL | Status: DC
  Administered 2014-05-28 – 2014-05-30 (×6): 50 mg via ORAL
  Filled 2014-05-28 (×8): qty 1

## 2014-05-28 MED ORDER — LISINOPRIL 40 MG PO TABS
40.0000 mg | ORAL_TABLET | Freq: Every day | ORAL | Status: DC
  Administered 2014-05-29 – 2014-05-30 (×2): 40 mg via ORAL
  Filled 2014-05-28 (×2): qty 1

## 2014-05-28 MED ORDER — LABETALOL HCL 5 MG/ML IV SOLN
20.0000 mg | INTRAVENOUS | Status: DC | PRN
  Filled 2014-05-28: qty 4

## 2014-05-28 MED ORDER — POTASSIUM CHLORIDE 10 MEQ/100ML IV SOLN
10.0000 meq | INTRAVENOUS | Status: AC
  Administered 2014-05-28 (×2): 10 meq via INTRAVENOUS
  Filled 2014-05-28 (×2): qty 100

## 2014-05-28 MED ORDER — METOCLOPRAMIDE HCL 5 MG/ML IJ SOLN
5.0000 mg | Freq: Four times a day (QID) | INTRAMUSCULAR | Status: DC
  Administered 2014-05-28 – 2014-05-30 (×7): 5 mg via INTRAVENOUS
  Filled 2014-05-28 (×11): qty 1

## 2014-05-28 MED ORDER — HYDRALAZINE HCL 20 MG/ML IJ SOLN
10.0000 mg | Freq: Once | INTRAMUSCULAR | Status: AC
  Administered 2014-05-28: 10 mg via INTRAVENOUS

## 2014-05-28 MED ORDER — HYDRALAZINE HCL 25 MG PO TABS
25.0000 mg | ORAL_TABLET | Freq: Once | ORAL | Status: DC
  Filled 2014-05-28: qty 1

## 2014-05-28 MED ORDER — PROMETHAZINE HCL 25 MG/ML IJ SOLN: 12.5000 mg | Freq: Four times a day (QID) | INTRAMUSCULAR | Status: DC | PRN

## 2014-05-28 MED ORDER — ENOXAPARIN (LOVENOX) PATIENT EDUCATION KIT: PACK | Freq: Once | Status: DC

## 2014-05-28 NOTE — Telephone Encounter (Signed)
, °

## 2014-05-28 NOTE — Telephone Encounter (Signed)
Per staff message and POF I have scheduled appts. Advised scheduler of appts. JMW  

## 2014-05-28 NOTE — Care Management Note (Addendum)
    Page 1 of 2   05/30/2014     4:47:54 PM CARE MANAGEMENT NOTE 05/30/2014  Patient:  Tamara [name], Tamara [name]   Account Number:  [id number]  Date Initiated:  05/28/2014  Documentation initiated by:  MAYO,HENRIETTA  Subjective/Objective Assessment:   dx sepsis; lives with 3 children, pt and husband are separated but her mother is staying with her to assist as needed.    PCP  Angelica Chessman     Action/Plan:   discharge planning   Anticipated DC Date:  05/30/2014   Anticipated DC Plan:  [place]  CM consult      Choice offered to / List presented to:  C-1 Patient   DME arranged  IV PUMP/EQUIPMENT      DME agency  [place] arranged  HH-1 RN      [place].   Status of service:   Medicare Important Message given?   (If response is "NO", the following Medicare IM given date fields will be blank) Date Medicare IM given:   Date Additional Medicare IM given:    Discharge Disposition:  [place]  Per UR Regulation:  Reviewed for med. necessity/level of care/duration of stay  If discussed at [place] of Stay Meetings, dates discussed:   05/25/2014  05/27/2014    Comments:  05/30/14 16:20 RN called CM to arrange Home IV ABX.  CM faxed IV ABX  to [facility].  SOC for IV ABX this evening at pt's home.  No other CM needs were communicated.  Mariane Masters, BSN, [place].  05/28/14 [place] RN MSN BSN CCM Pt to discharge on Lovenox 95 mg SQ q 12 hrs and home IV antibx.  Pt is followed by [facility] and their pharmacy does not carry Lovenox.  Pt also uses [facility] on [address], and they have Lovenox 100 mg syringes in stock.  Provided a list of home health agencies, referral made to [place] per choice.

## 2014-05-28 NOTE — Progress Notes (Signed)
Extensive chart review.   [facility] TEAM 1 - Stepdown/ICU TEAM Progress Note  Tamara [name] IDH:[id number] DOB: 10-Apr-1971 DOA: 05/19/2014 PCP: Angelica Chessman, MD  Admit HPI / Brief Narrative: Tamara [name] is a 43 y.o. 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 She has also been having chest discomfort x 1 week and had a CTA of the Chest performed as an outpatient which was negative for a Pulmonary embolism. She had a temperature to 102 on admission.   Assessment/Plan:  Sepsis, MSSA, secondary to portacath infection portacath removed. picc in. TEE negative for vegetation, but shows malposition of PICC. Will check CXR and pull back. D/w picc RN.  Continue ancef through 7/4. Home health ordered. MRI back negative.  DVT right internal jugular and subclavian veins. On lovenox. CM consulted for lovenox copay info and home health. Needs lovenox teaching.  Malignant hypertension: may be related to pain and nausea, or vice versa. Increase coreg and oral hydralazine. IV hydralazine on back order, so have changed to IV labetalol prn. zofran and dilaudid prn.  Continue SDU until BP stabilizes  AKI -Resolved  Anemia/chemo related and chronic disease -6/18 transfuse 3 unit PRBC Occult Blood  -Patient's baseline hemoglobin approximately 10 last taken on 6/9. currently at 8.8 stable since transfusion Iron and vit c may be contributing to nausea. 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   Code Status: FULL Family Communication: none Disposition Plan: continue SDU until BP stabilized  Consultants: Dr. Evlyn Clines (oncology), Dr. Carlyle Basques (infectious disease) Dr. Greer Pickerel (surgery)  Procedure/Significant Events: 6/17 CXR;Minimal subsegmental atelectasis and/or infiltrate right lung bases;noted recent CT 05/11/2014.    6/19 S./P. Port-A-Cath removal secondary to sepsis 6/19 Doppler right upper extremity;positive for deep vein thrombosis involving the right internal jugular and subclavian veins. 6/23 double-lumen midline PICC placed  Culture 6/17 BLOOD ARM LEFT x 2 MSSA  6/17 Urine negative (final)   6/19 blood Port-A-Cath MSSA 6/20 blood left forearm/right arm neg 6/20 C. difficile by PCR negative  Antibiotics: Cefepime 6/18>> stopped 6/19 Vacomycin 6/18>> stopped 6/20 Nafcillin 6/19>> stopped 6/24 Ancef 6/24>>  Continuous Infusions: . sodium chloride 1,000 mL (05/28/14 0800)   HPI/Subjective: C/o headache, holocephalic, and nausea. Has not ambulated much  Objective: VITAL SIGNS: Temp: 98.3 F (36.8 C) (06/26 0823) Temp src: Oral (06/26 0823) BP: 198/95 mmHg (06/26 0933) Pulse Rate: 87 (06/26 0933) SPO2;96 % on RA FIO2:   Intake/Output Summary (Last 24 hours) at 05/28/14 0957 Last data filed at 05/28/14 0936  Gross per 24 hour  Intake    400 ml  Output      0 ml  Net    400 ml     Exam: General: uncomfortable. A and o Lungs: Clear to auscultation bilaterally Cardiovascular: Regular rate and rhythm without murmur gallop or rub normal S1 and S2, Abdomen: Nontender, nondistended, soft, bowel sounds positive, no rebound, no ascites, no appreciable mass Extremities: No significant cyanosis, clubbing, or edema bilateral lower extremities  Data Reviewed: Basic Metabolic Panel:  Recent Labs Lab 05/23/14 0330 05/24/14 0229 05/25/14 0330 05/26/14 0601 05/27/14 0213 05/28/14 0130  NA 143 142 143 143  142 141  K 3.7 3.2* 3.2* 3.8 3.4* 3.2*  CL 105 103 100 103 102 100  CO2 24 25 28 28 26 26   GLUCOSE 104* 120* 99 102* 98 108*  BUN 4* 4* 3* 5* 5* 5*   CREATININE 0.88 0.77 0.75 0.82 0.80 0.76  CALCIUM 8.7 8.5 9.0 9.3 9.0 9.3  MG 1.8 1.9 1.7 2.1  --  1.8   Liver Function Tests:  Recent Labs Lab 05/22/14 0322 05/23/14 0330 05/24/14 0229 05/25/14 0330 05/26/14 0601  AST 16 23 17 23 18   ALT 45* 41* 32 31 25  ALKPHOS 114 105 98 87 92  BILITOT 0.6 0.6 0.3 0.3 0.4  PROT 5.9* 6.3 5.9* 6.3 6.8  ALBUMIN 2.1* 2.3* 2.1* 2.3* 2.5*   No results found for this basename: LIPASE, AMYLASE,  in the last 168 hours No results found for this basename: AMMONIA,  in the last 168 hours CBC:  Recent Labs Lab 05/22/14 0322 05/23/14 0330 05/24/14 0229 05/25/14 0330 05/26/14 0530 05/27/14 0500  WBC 13.9* 11.7* 11.0* 10.1 9.8 7.9  NEUTROABS 10.4* 8.5* 7.7  --   --   --   HGB 8.1* 8.7* 8.0* 7.9* 9.3* 8.8*  HCT 24.4* 26.4* 24.0* 24.4* 28.6* 27.0*  MCV 92.1 93.0 93.8 95.7 93.2 94.1  PLT 197 259 217 235 200 184   Cardiac Enzymes: No results found for this basename: CKTOTAL, CKMB, CKMBINDEX, TROPONINI,  in the last 168 hours BNP (last 3 results)  Recent Labs  05/11/14 1318  PROBNP 173.9*   CBG: No results found for this basename: GLUCAP,  in the last 168 hours  Recent Results (from the past 240 hour(s))  CULTURE, BLOOD (ROUTINE X 2)     Status: None   Collection Time    05/19/14  6:55 PM      Result Value Ref Range Status   Specimen Description BLOOD ARM LEFT   Final   Special Requests BOTTLES DRAWN AEROBIC AND ANAEROBIC 5CC   Final   Culture  Setup Time     Final   Value: 05/20/2014 01:19     Performed at [facility]   Culture     Final   Value: STAPHYLOCOCCUS AUREUS     Note: RIFAMPIN AND GENTAMICIN SHOULD NOT BE USED AS SINGLE DRUGS FOR TREATMENT OF STAPH INFECTIONS. This organism is presumed to be Clindamycin resistant based on detection of inducible Clindamycin resistance.     Note: Gram Stain Report Called to,Read Back By and Verified With: DARA A@1 :15PM ON 05/20/14 BY DANTS     Performed at [facility]    Report Status 05/22/2014 FINAL   Final   Organism ID, Bacteria STAPHYLOCOCCUS AUREUS   Final  CULTURE, BLOOD (ROUTINE X 2)     Status: None   Collection Time    05/19/14  7:27 PM      Result Value Ref Range Status   Specimen Description BLOOD ARM LEFT   Final   Special Requests BOTTLES DRAWN AEROBIC AND ANAEROBIC 10CC   Final   Culture  Setup Time     Final   Value: 05/20/2014 01:19     Performed at [facility]   Culture     Final   Value: STAPHYLOCOCCUS AUREUS     Note: SUSCEPTIBILITIES PERFORMED ON PREVIOUS CULTURE WITHIN THE LAST 5 DAYS.     Note: Gram Stain Report Called to,Read Back By and Verified With: DARA A@1 :15PM ON 05/20/14 BY DANTS  Performed at [facility]   Report Status 05/22/2014 FINAL   Final  URINE CULTURE     Status: None   Collection Time    05/19/14  8:01 PM      Result Value Ref Range Status   Specimen Description URINE, RANDOM   Final   Special Requests NONE   Final   Culture  Setup Time     Final   Value: 05/19/2014 20:34     Performed at [facility]     Final   Value: NO GROWTH     Performed at [facility]   Culture     Final   Value: NO GROWTH     Performed at [facility]   Report Status 05/20/2014 FINAL   Final  MRSA PCR SCREENING     Status: None   Collection Time    05/19/14  9:51 PM      Result Value Ref Range Status   MRSA by PCR NEGATIVE  NEGATIVE Final   Comment:            The GeneXpert MRSA Assay (FDA     approved for NASAL specimens     only), is one component of a     comprehensive MRSA colonization     surveillance program. It is not     intended to diagnose MRSA     infection nor to guide or     monitor treatment for     MRSA infections.  CULTURE, ROUTINE-ABSCESS     Status: None   Collection Time    05/21/14  7:40 PM      Result Value Ref Range Status   Specimen Description ABSCESS PORTA CATH EXIT SITE   Final   Special Requests POF VANC MAXIPINE NAFICILLIN    Final   Gram Stain     Final   Value: RARE WBC PRESENT, PREDOMINANTLY PMN     NO ORGANISMS SEEN     Performed at [facility]   Culture     Final   Value: MODERATE STAPHYLOCOCCUS AUREUS     Note: RIFAMPIN AND GENTAMICIN SHOULD NOT BE USED AS SINGLE DRUGS FOR TREATMENT OF STAPH INFECTIONS. This organism is presumed to be Clindamycin resistant based on detection of inducible Clindamycin resistance.     Performed at [facility]   Report Status 05/25/2014 FINAL   Final   Organism ID, Bacteria STAPHYLOCOCCUS AUREUS   Final  ANAEROBIC CULTURE     Status: None   Collection Time    05/21/14  7:40 PM      Result Value Ref Range Status   Specimen Description ABSCESS PORTA CATH EXIT SITE   Final   Special Requests POF VANC MAXIPINE NAFICILLIN   Final   Gram Stain     Final   Value: RARE WBC PRESENT, PREDOMINANTLY PMN     NO ORGANISMS SEEN     Performed at [facility]   Culture     Final   Value: NO ANAEROBES ISOLATED     Performed at [facility]   Report Status 05/26/2014 FINAL   Final  CULTURE, BLOOD (ROUTINE X 2)     Status: None   Collection Time    05/22/14  1:00 PM      Result Value Ref Range Status   Specimen Description BLOOD LEFT FOREARM   Final   Special Requests BOTTLES DRAWN AEROBIC AND ANAEROBIC 5CC  Final   Culture  Setup Time     Final   Value: 05/22/2014 18:54     Performed at [facility]   Culture     Final   Value: NO GROWTH 5 DAYS     Performed at [facility]   Report Status 05/28/2014 FINAL   Final  CULTURE, BLOOD (ROUTINE X 2)     Status: None   Collection Time    05/22/14  2:09 PM      Result Value Ref Range Status   Specimen Description BLOOD RIGHT ARM   Final   Special Requests BOTTLES DRAWN AEROBIC AND ANAEROBIC 10CC   Final   Culture  Setup Time     Final   Value: 05/22/2014 18:54     Performed at [facility]   Culture     Final   Value: NO GROWTH 5 DAYS     Performed at [facility]    Report Status 05/28/2014 FINAL   Final  CLOSTRIDIUM DIFFICILE BY PCR     Status: None   Collection Time    05/22/14  2:14 PM      Result Value Ref Range Status   C difficile by pcr NEGATIVE  NEGATIVE Final  CLOSTRIDIUM DIFFICILE BY PCR     Status: None   Collection Time    05/26/14 12:50 AM      Result Value Ref Range Status   C difficile by pcr NEGATIVE  NEGATIVE Final    Scheduled Meds:  Scheduled Meds: . acetaminophen  650 mg Oral Once  . carvedilol  6.25 mg Oral BID WC  .  ceFAZolin (ANCEF) IV  2 g Intravenous 3 times per day  . enoxaparin (LOVENOX) injection  95 mg Subcutaneous Q12H  . feeding supplement (ENSURE COMPLETE)  237 mL Oral QID  . ferrous sulfate  325 mg Oral TID WC  . hydrALAZINE  10 mg Oral 3 times per day  . lisinopril  20 mg Oral Daily  . pantoprazole  40 mg Oral Daily  . sodium chloride  10-40 mL Intracatheter Q12H  . vitamin C  500 mg Oral TID    Time spent on care of this patient: 45 mins   Delfina Redwood , MD   [facility] Pager - [phone]  If 7PM-7AM, please contact night-coverage [email or web address] Password [facility] 05/28/2014, 9:57 AM   LOS: 9 days

## 2014-05-28 NOTE — Progress Notes (Signed)
[facility] day 10 Antibiotics: day 8/14 nafcillin (to change to cefazolin at DC), to complete 06-05-14.  Chemotherapy: cycle 4 neoadjuvant carboplatin/taxotere/herceptin/pertuzumab given 05-13-14. Cycle 5 due 05-27-14 on hold due to treatment of staph bacteremia.    EMR reviewed and I have discussed with RN on unit.  Patient sleeping quietly now and I did not waken her; no family here presently.  New problem today has been hypertension, BP still 190/77 despite multiple medications. Per RN, also has had HA today. I would not expect hypertension related to chemotherapy, with last treatment 05-13-14. She has tolerated zofran well previously, so I doubt that is cause of HA.  Note she is established with [facility] cardiology, following EF while on herceptin/ pertuzumab , last seen by Dr Haroldine Laws 05-05-14. She was recently diagnosed with HTN per his note.   If she is stable for DC over weekend, [facility] should manage PICC while she completes IV antibiotics, then flushes/ dressing changes can be done at [facility]. Lovenox for the right jugular DVT can be followed thru [facility]. Appointments are in EMR for visit back to Dr Marko Plume on 06-03-14 with cycle 5 chemo same day if stable (as I discussed with Dr Carlyle Basques).  Please call over weekend if medical oncology assistance needed.  Evlyn Clines, MD [phone]

## 2014-05-29 ENCOUNTER — Other Ambulatory Visit: Payer: Self-pay | Admitting: Oncology

## 2014-05-29 LAB — CBC
HCT: 29.7 % — ABNORMAL LOW (ref 36.0–46.0)
HEMOGLOBIN: 9.2 g/dL — AB (ref 12.0–15.0)
MCH: 30.1 pg (ref 26.0–34.0)
MCHC: 31 g/dL (ref 30.0–36.0)
MCV: 97.1 fL (ref 78.0–100.0)
Platelets: 189 10*3/uL (ref 150–400)
RBC: 3.06 MIL/uL — ABNORMAL LOW (ref 3.87–5.11)
RDW: 20.8 % — ABNORMAL HIGH (ref 11.5–15.5)
WBC: 7.7 10*3/uL (ref 4.0–10.5)

## 2014-05-29 LAB — BASIC METABOLIC PANEL
BUN: 7 mg/dL (ref 6–23)
CO2: 27 meq/L (ref 19–32)
Calcium: 9.2 mg/dL (ref 8.4–10.5)
Chloride: 102 mEq/L (ref 96–112)
Creatinine, Ser: 0.75 mg/dL (ref 0.50–1.10)
GFR calc Af Amer: >90 mL/min (ref >90)
GLUCOSE: 97 mg/dL (ref 70–99)
POTASSIUM: 3.6 meq/L — AB (ref 3.7–5.3)
Sodium: 143 mEq/L (ref 137–147)

## 2014-05-29 MED ORDER — CLONIDINE HCL 0.2 MG PO TABS
0.2000 mg | ORAL_TABLET | Freq: Two times a day (BID) | ORAL | Status: DC
  Administered 2014-05-29 – 2014-05-30 (×3): 0.2 mg via ORAL
  Filled 2014-05-29 (×4): qty 1

## 2014-05-29 MED ORDER — CARVEDILOL 25 MG PO TABS
25.0000 mg | ORAL_TABLET | Freq: Two times a day (BID) | ORAL | Status: DC
  Administered 2014-05-29 – 2014-05-30 (×2): 25 mg via ORAL
  Filled 2014-05-29 (×4): qty 1

## 2014-05-29 NOTE — Progress Notes (Signed)
Report given and pt to transfer soon.

## 2014-05-29 NOTE — Progress Notes (Signed)
ANTICOAGULATION CONSULT NOTE - Follow Up Consult  Pharmacy Consult for lovenox Indication: DVT  No Known Allergies  Patient Measurements: Height: 5\' 5"  (165.1 cm) Weight: 200 lb 9.9 oz (91 kg) IBW/kg (Calculated) : 57 Heparin Dosing Weight:   Vital Signs: Temp: 98.1 F (36.7 C) (06/27 0740) Temp src: Oral (06/27 0740) BP: 168/90 mmHg (06/27 0740) Pulse Rate: 95 (06/27 0740)  Labs:  Recent Labs  05/27/14 0213 05/27/14 0500 05/28/14 0130 05/29/14 0525  HGB  --  8.8*  --  9.2*  HCT  --  27.0*  --  29.7*  PLT  --  184  --  189  CREATININE 0.80  --  0.76 0.75    Estimated Creatinine Clearance: 104.2 ml/min (by C-G formula based on Cr of 0.75).   Medications:  Scheduled:  . acetaminophen  650 mg Oral Once  . carvedilol  12.5 mg Oral BID WC  .  ceFAZolin (ANCEF) IV  2 g Intravenous 3 times per day  . enoxaparin (LOVENOX) injection  95 mg Subcutaneous Q12H  . feeding supplement (ENSURE COMPLETE)  237 mL Oral QID  . hydrALAZINE  25 mg Oral Once  . hydrALAZINE  50 mg Oral 3 times per day  . lisinopril  40 mg Oral Daily  . metoCLOPramide (REGLAN) injection  5 mg Intravenous 4 times per day  . pantoprazole  40 mg Oral Daily  . sodium chloride  10-40 mL Intracatheter Q12H   Infusions:  . sodium chloride 1,000 mL (05/28/14 1212)    Assessment: 43 yo female with DVT continues on sq lovenox.  Hgb 9.2 and Plt 189 K. Renal function normal. No bleeding issues noted, possible d/c over weekend.  Chemo on hold with possible restart 06/03/14 if stable. MSSA bacteremia - continues on ancef until 06/05/14. No fevers, wbc normal.   Goal of Therapy:  Anti-Xa level 0.6-1 units/ml 4hrs after LMWH dose given Monitor platelets by anticoagulation protocol: Yes   Plan: 1) lovenox 95 mg sq q12h  2) CBC every 72 hours  Erin Hearing PharmD., BCPS Clinical Pharmacist Pager [phone] 05/29/2014 8:35 AM

## 2014-05-29 NOTE — Progress Notes (Signed)
Attempted to call report to RN on 5W at 1500. RN was not available at the time. Currently waiting her return call for report.

## 2014-05-29 NOTE — Progress Notes (Signed)
Pt arrived to 5W21. Alert and oriented x4. Pt oriented to room and unit. Pt denies pain. Call bell and phone within reach. Will continue to monitor.

## 2014-05-29 NOTE — Progress Notes (Signed)
[facility] TEAM 1 - Stepdown/ICU TEAM Progress Note  Tamara [name] NUU:[id number] DOB: 09-Sep-1971 DOA: 05/19/2014 PCP: Angelica Chessman, MD  Admit HPI / Brief Narrative: Tamara [name] is a 43 y.o. 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 She has also been having chest discomfort x 1 week and had a CTA of the Chest performed as an outpatient which was negative for a Pulmonary embolism. She had a temperature to 102 on admission.   Assessment/Plan:  Sepsis, MSSA, secondary to portacath infection portacath removed. picc in. TEE negative for vegetation, but shows malposition of PICC. Will check CXR and pull back. D/w picc RN.  Continue ancef through 7/4. Home health ordered. MRI back negative.  DVT right internal jugular and subclavian veins. On lovenox. Will go home on lovenox  Malignant hypertension: still high. Adjust meds and transfer to tele  Nausea/vomiting/headache: resolved.  Does not want to stop scheduled reglan yet. Still on clears. Advance diet. abd xray negative. lfts and lipase ok.  AKI -Resolved  Anemia/chemo related and chronic disease -6/18 transfuse 3 unit PRBC Occult Blood  -Patient's baseline hemoglobin approximately 10 last taken on 6/9. 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   Code Status: FULL Family Communication: none Disposition Plan: continue SDU until BP stabilized  Consultants: Dr. Evlyn Clines (oncology), Dr. Carlyle Basques (infectious disease) Dr. Greer Pickerel (surgery)  Procedure/Significant Events: 6/17  CXR;Minimal subsegmental atelectasis and/or infiltrate right lung bases;noted recent CT 05/11/2014.    6/19 S./P. Port-A-Cath removal secondary to sepsis 6/19 Doppler right upper extremity;positive for deep vein thrombosis involving the right internal jugular and subclavian veins. 6/23 double-lumen midline PICC placed  Culture 6/17 BLOOD ARM LEFT x 2 MSSA  6/17 Urine negative (final)   6/19 blood Port-A-Cath MSSA 6/20 blood left forearm/right arm neg 6/20 C. difficile by PCR negative  Antibiotics: Cefepime 6/18>> stopped 6/19 Vacomycin 6/18>> stopped 6/20 Nafcillin 6/19>> stopped 6/24 Ancef 6/24>>  Continuous Infusions: . sodium chloride 1,000 mL (05/28/14 1212)   HPI/Subjective: H/a, N/V resolved  Objective: VITAL SIGNS: Temp: 98.1 F (36.7 C) (06/27 0740) Temp src: Oral (06/27 0740) BP: 168/90 mmHg (06/27 0740) Pulse Rate: 95 (06/27 0740) SPO2;96 % on RA FIO2:   Intake/Output Summary (Last 24 hours) at 05/29/14 1121 Last data filed at 05/29/14 0700  Gross per 24 hour  Intake    925 ml  Output      0 ml  Net    925 ml     Exam: General: more comfortable Lungs: Clear to auscultation bilaterally Cardiovascular: Regular rate and rhythm without murmur gallop or rub normal S1 and S2, Abdomen: Nontender, nondistended, soft, bowel sounds positive, no rebound, no ascites, no appreciable mass Extremities: No significant cyanosis, clubbing, or edema bilateral lower extremities  Data Reviewed: Basic Metabolic Panel:  Recent Labs Lab 05/23/14 0330 05/24/14 0229 05/25/14 0330 05/26/14 0601 05/27/14 0213 05/28/14 0130 05/29/14 0525  NA 143 142 143 143 142 141 143  K 3.7 3.2* 3.2* 3.8 3.4* 3.2* 3.6*  CL 105 103 100 103 102 100 102  CO2 24 25 28  28 26 26 27   GLUCOSE 104* 120* 99 102* 98 108* 97  BUN 4* 4* 3* 5* 5* 5* 7  CREATININE 0.88 0.77 0.75 0.82 0.80 0.76 0.75  CALCIUM 8.7 8.5 9.0 9.3 9.0 9.3 9.2  MG 1.8 1.9 1.7 2.1  --  1.8  --    Liver Function  Tests:  Recent Labs Lab 05/23/14 0330 05/24/14 0229 05/25/14 0330 05/26/14 0601 05/28/14 1652  AST 23 17 23 18 12   ALT 41* 32 31 25 13   ALKPHOS 105 98 87 92 81  BILITOT 0.6 0.3 0.3 0.4 0.3  PROT 6.3 5.9* 6.3 6.8 7.3  ALBUMIN 2.3* 2.1* 2.3* 2.5* 2.5*    Recent Labs Lab 05/28/14 1652  LIPASE 56   No results found for this basename: AMMONIA,  in the last 168 hours CBC:  Recent Labs Lab 05/23/14 0330 05/24/14 0229 05/25/14 0330 05/26/14 0530 05/27/14 0500 05/29/14 0525  WBC 11.7* 11.0* 10.1 9.8 7.9 7.7  NEUTROABS 8.5* 7.7  --   --   --   --   HGB 8.7* 8.0* 7.9* 9.3* 8.8* 9.2*  HCT 26.4* 24.0* 24.4* 28.6* 27.0* 29.7*  MCV 93.0 93.8 95.7 93.2 94.1 97.1  PLT 259 217 235 200 184 189   Cardiac Enzymes: No results found for this basename: CKTOTAL, CKMB, CKMBINDEX, TROPONINI,  in the last 168 hours BNP (last 3 results)  Recent Labs  05/11/14 1318  PROBNP 173.9*   CBG: No results found for this basename: GLUCAP,  in the last 168 hours  Recent Results (from the past 240 hour(s))  CULTURE, BLOOD (ROUTINE X 2)     Status: None   Collection Time    05/19/14  6:55 PM      Result Value Ref Range Status   Specimen Description BLOOD ARM LEFT   Final   Special Requests BOTTLES DRAWN AEROBIC AND ANAEROBIC 5CC   Final   Culture  Setup Time     Final   Value: 05/20/2014 01:19     Performed at [facility]   Culture     Final   Value: STAPHYLOCOCCUS AUREUS     Note: RIFAMPIN AND GENTAMICIN SHOULD NOT BE USED AS SINGLE DRUGS FOR TREATMENT OF STAPH INFECTIONS. This organism is presumed to be Clindamycin resistant based on detection of inducible Clindamycin resistance.     Note: Gram Stain Report Called to,Read Back By and Verified With: DARA A@1 :15PM ON 05/20/14 BY DANTS     Performed at [facility]   Report Status 05/22/2014 FINAL   Final   Organism ID, Bacteria STAPHYLOCOCCUS AUREUS   Final  CULTURE, BLOOD (ROUTINE X 2)     Status: None   Collection Time     05/19/14  7:27 PM      Result Value Ref Range Status   Specimen Description BLOOD ARM LEFT   Final   Special Requests BOTTLES DRAWN AEROBIC AND ANAEROBIC 10CC   Final   Culture  Setup Time     Final   Value: 05/20/2014 01:19     Performed at [facility]   Culture     Final   Value: STAPHYLOCOCCUS AUREUS     Note: SUSCEPTIBILITIES PERFORMED ON PREVIOUS CULTURE WITHIN THE LAST 5 DAYS.     Note: Gram Stain Report Called to,Read Back By and Verified With: DARA A@1 :15PM ON 05/20/14 BY DANTS     Performed at [facility]   Report Status 05/22/2014 FINAL   Final  URINE  CULTURE     Status: None   Collection Time    05/19/14  8:01 PM      Result Value Ref Range Status   Specimen Description URINE, RANDOM   Final   Special Requests NONE   Final   Culture  Setup Time     Final   Value: 05/19/2014 20:34     Performed at [place]     Final   Value: NO GROWTH     Performed at [facility]   Culture     Final   Value: NO GROWTH     Performed at [facility]   Report Status 05/20/2014 FINAL   Final  MRSA PCR SCREENING     Status: None   Collection Time    05/19/14  9:51 PM      Result Value Ref Range Status   MRSA by PCR NEGATIVE  NEGATIVE Final   Comment:            The GeneXpert MRSA Assay (FDA     approved for NASAL specimens     only), is one component of a     comprehensive MRSA colonization     surveillance program. It is not     intended to diagnose MRSA     infection nor to guide or     monitor treatment for     MRSA infections.  CULTURE, ROUTINE-ABSCESS     Status: None   Collection Time    05/21/14  7:40 PM      Result Value Ref Range Status   Specimen Description ABSCESS PORTA CATH EXIT SITE   Final   Special Requests POF VANC MAXIPINE NAFICILLIN   Final   Gram Stain     Final   Value: RARE WBC PRESENT, PREDOMINANTLY PMN     NO ORGANISMS SEEN     Performed at [facility]   Culture     Final    Value: MODERATE STAPHYLOCOCCUS AUREUS     Note: RIFAMPIN AND GENTAMICIN SHOULD NOT BE USED AS SINGLE DRUGS FOR TREATMENT OF STAPH INFECTIONS. This organism is presumed to be Clindamycin resistant based on detection of inducible Clindamycin resistance.     Performed at [facility]   Report Status 05/25/2014 FINAL   Final   Organism ID, Bacteria STAPHYLOCOCCUS AUREUS   Final  ANAEROBIC CULTURE     Status: None   Collection Time    05/21/14  7:40 PM      Result Value Ref Range Status   Specimen Description ABSCESS PORTA CATH EXIT SITE   Final   Special Requests POF VANC MAXIPINE NAFICILLIN   Final   Gram Stain     Final   Value: RARE WBC PRESENT, PREDOMINANTLY PMN     NO ORGANISMS SEEN     Performed at [facility]   Culture     Final   Value: NO ANAEROBES ISOLATED     Performed at [facility]   Report Status 05/26/2014 FINAL   Final  CULTURE, BLOOD (ROUTINE X 2)     Status: None   Collection Time    05/22/14  1:00 PM      Result Value Ref Range Status   Specimen Description BLOOD LEFT FOREARM   Final   Special Requests BOTTLES DRAWN AEROBIC AND ANAEROBIC 5CC   Final   Culture  Setup Time     Final   Value: 05/22/2014  18:54     Performed at [facility]     Final   Value: NO GROWTH 5 DAYS     Performed at [facility]   Report Status 05/28/2014 FINAL   Final  CULTURE, BLOOD (ROUTINE X 2)     Status: None   Collection Time    05/22/14  2:09 PM      Result Value Ref Range Status   Specimen Description BLOOD RIGHT ARM   Final   Special Requests BOTTLES DRAWN AEROBIC AND ANAEROBIC 10CC   Final   Culture  Setup Time     Final   Value: 05/22/2014 18:54     Performed at [facility]   Culture     Final   Value: NO GROWTH 5 DAYS     Performed at [facility]   Report Status 05/28/2014 FINAL   Final  CLOSTRIDIUM DIFFICILE BY PCR     Status: None   Collection Time    05/22/14  2:14 PM      Result Value Ref Range  Status   C difficile by pcr NEGATIVE  NEGATIVE Final  CLOSTRIDIUM DIFFICILE BY PCR     Status: None   Collection Time    05/26/14 12:50 AM      Result Value Ref Range Status   C difficile by pcr NEGATIVE  NEGATIVE Final    Scheduled Meds:  Scheduled Meds: . acetaminophen  650 mg Oral Once  . carvedilol  12.5 mg Oral BID WC  .  ceFAZolin (ANCEF) IV  2 g Intravenous 3 times per day  . enoxaparin (LOVENOX) injection  95 mg Subcutaneous Q12H  . feeding supplement (ENSURE COMPLETE)  237 mL Oral QID  . hydrALAZINE  25 mg Oral Once  . hydrALAZINE  50 mg Oral 3 times per day  . lisinopril  40 mg Oral Daily  . metoCLOPramide (REGLAN) injection  5 mg Intravenous 4 times per day  . pantoprazole  40 mg Oral Daily  . sodium chloride  10-40 mL Intracatheter Q12H    Time spent on care of this patient: 45 mins   Delfina Redwood , MD   [facility] Pager - [phone]  If 7PM-7AM, please contact night-coverage [email or web address] Password [email or web address] 05/29/2014, 11:21 AM   LOS: 10 days

## 2014-05-30 ENCOUNTER — Encounter (HOSPITAL_COMMUNITY): Payer: Self-pay | Admitting: Emergency Medicine

## 2014-05-30 ENCOUNTER — Emergency Department (HOSPITAL_COMMUNITY)
Admission: EM | Admit: 2014-05-30 | Discharge: 2014-05-30 | Disposition: A | Discharge status: Home / Self Care | Payer: Medicaid Other | Attending: Emergency Medicine | Admitting: Emergency Medicine

## 2014-05-30 DIAGNOSIS — Z87891 Personal history of nicotine dependence: Secondary | ICD-10-CM | POA: Insufficient documentation

## 2014-05-30 DIAGNOSIS — T80211A Bloodstream infection due to central venous catheter, initial encounter: Secondary | ICD-10-CM

## 2014-05-30 DIAGNOSIS — T889XXS Complication of surgical and medical care, unspecified, sequela: Secondary | ICD-10-CM

## 2014-05-30 DIAGNOSIS — I10 Essential (primary) hypertension: Secondary | ICD-10-CM | POA: Insufficient documentation

## 2014-05-30 DIAGNOSIS — Z791 Long term (current) use of non-steroidal anti-inflammatories (NSAID): Secondary | ICD-10-CM | POA: Insufficient documentation

## 2014-05-30 DIAGNOSIS — IMO0002 Reserved for concepts with insufficient information to code with codable children: Secondary | ICD-10-CM | POA: Insufficient documentation

## 2014-05-30 DIAGNOSIS — Z79899 Other long term (current) drug therapy: Secondary | ICD-10-CM | POA: Insufficient documentation

## 2014-05-30 DIAGNOSIS — Z7901 Long term (current) use of anticoagulants: Secondary | ICD-10-CM | POA: Insufficient documentation

## 2014-05-30 DIAGNOSIS — I82409 Acute embolism and thrombosis of unspecified deep veins of unspecified lower extremity: Secondary | ICD-10-CM | POA: Insufficient documentation

## 2014-05-30 DIAGNOSIS — Z8742 Personal history of other diseases of the female genital tract: Secondary | ICD-10-CM | POA: Insufficient documentation

## 2014-05-30 DIAGNOSIS — Z853 Personal history of malignant neoplasm of breast: Secondary | ICD-10-CM | POA: Insufficient documentation

## 2014-05-30 DIAGNOSIS — Z792 Long term (current) use of antibiotics: Secondary | ICD-10-CM | POA: Insufficient documentation

## 2014-05-30 DIAGNOSIS — Z862 Personal history of diseases of the blood and blood-forming organs and certain disorders involving the immune mechanism: Secondary | ICD-10-CM | POA: Insufficient documentation

## 2014-05-30 LAB — CBC
HEMATOCRIT: 28.7 % — AB (ref 36.0–46.0)
Hemoglobin: 8.9 g/dL — ABNORMAL LOW (ref 12.0–15.0)
MCH: 30.2 pg (ref 26.0–34.0)
MCHC: 31 g/dL (ref 30.0–36.0)
MCV: 97.3 fL (ref 78.0–100.0)
PLATELETS: 188 10*3/uL (ref 150–400)
RBC: 2.95 MIL/uL — ABNORMAL LOW (ref 3.87–5.11)
RDW: 20.3 % — AB (ref 11.5–15.5)
WBC: 7.5 10*3/uL (ref 4.0–10.5)

## 2014-05-30 MED ORDER — HEPARIN SOD (PORK) LOCK FLUSH 100 UNIT/ML IV SOLN
250.0000 [IU] | Freq: Every day | INTRAVENOUS | Status: DC
  Filled 2014-05-30: qty 3

## 2014-05-30 MED ORDER — CEFAZOLIN SODIUM-DEXTROSE 2-3 GM-% IV SOLR: 2.0000 g | Freq: Three times a day (TID) | INTRAVENOUS | Status: AC

## 2014-05-30 MED ORDER — HYDRALAZINE HCL 50 MG PO TABS: 50.0000 mg | ORAL_TABLET | Freq: Three times a day (TID) | ORAL | Status: DC

## 2014-05-30 MED ORDER — ENOXAPARIN SODIUM 100 MG/ML ~~LOC~~ SOLN
95.0000 mg | Freq: Once | SUBCUTANEOUS | Status: AC
  Administered 2014-05-30: 95 mg via SUBCUTANEOUS
  Filled 2014-05-30: qty 1

## 2014-05-30 MED ORDER — ENOXAPARIN SODIUM 100 MG/ML ~~LOC~~ SOLN: 95.0000 mg | Freq: Two times a day (BID) | SUBCUTANEOUS | Status: DC

## 2014-05-30 MED ORDER — HEPARIN SOD (PORK) LOCK FLUSH 100 UNIT/ML IV SOLN
250.0000 [IU] | INTRAVENOUS | Status: DC | PRN
  Administered 2014-05-30: 250 [IU]
  Filled 2014-05-30: qty 3

## 2014-05-30 MED ORDER — LISINOPRIL 40 MG PO TABS: 40.0000 mg | ORAL_TABLET | Freq: Every day | ORAL | Status: DC

## 2014-05-30 MED ORDER — CLONIDINE HCL 0.2 MG PO TABS: 0.2000 mg | ORAL_TABLET | Freq: Two times a day (BID) | ORAL | Status: DC

## 2014-05-30 NOTE — Progress Notes (Signed)
Pt has 5beats of Vtach. Vital sign taken and stable. No complaints made. Callahan,NP made aware.

## 2014-05-30 NOTE — Discharge Summary (Signed)
Physician Discharge Summary  Tamara [name] JGO:[id number] DOB: 15-Dec-1970 DOA: 05/19/2014  PCP: Angelica Chessman, MD  Admit date: 05/19/2014 Discharge date: 05/30/2014  Time spent: >45 minutes  Recommendations for Outpatient Follow-up:  1. Home with HHRN 2. F/u on BP and modify medications as needed  Discharge Diagnoses:  Principal Problem:   Staphylococcus aureus bacteremia with sepsis// Bloodstream infection due to port-a-cath Active Problems:   AKI (acute kidney injury)   Breast cancer of upper-outer quadrant of left female breast   HTN (hypertension)   Iron deficiency anemia, unspecified   Sinus tachycardia   DVT (deep venous thrombosis) right IJ/subclavian     Discharge Condition: stable  Diet recommendation: low sodium  Filed Weights   05/26/14 2200 05/27/14 0400 05/29/14 1742  Weight: 91.8 kg (202 lb 6.1 oz) 91 kg (200 lb 9.9 oz) 91.9 kg (202 lb 9.6 oz)    History of present illness:  Tamara [name] is a 43 y.o. 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 She has also been having chest discomfort x 1 week and had a CTA of the Chest performed as an outpatient which was negative for a Pulmonary embolism. She had a temperature to 102 on admission.    Hospital Course:  Sepsis, MSSA, secondary to portacath infection  portacath removed. Picc line placed  TEE negative for vegetation, b  Continue ancef through 7/4. Home health ordered. MRI back negative.   DVT right internal jugular and subclavian veins.  Due to port infection?? On lovenox. Will go home on lovenox   Malignant hypertension: still high. Adjust meds and transfer to tele    Nausea/vomiting/headache: resolved. Does not want to stop scheduled reglan yet. Still on clears. Advance diet. abd xray negative. lfts and lipase ok.   AKI  -Resolved   Anemia/chemo related and chronic disease  -6/18 transfuse 3 unit PRBC  - FOC negative -Patient's baseline hemoglobin approximately 10 last taken on 6/9. 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  - Dr Marko Plume plans to give next chemo on July 2nd    Procedures: 6/17 CXR;Minimal subsegmental atelectasis and/or infiltrate right lung bases;noted recent CT 05/11/2014.  6/19 S./P. Port-A-Cath removal secondary to sepsis  6/19 Doppler right upper extremity;positive for deep vein thrombosis involving the right internal jugular and subclavian veins.  6/23 double-lumen midline PICC placed   Consultations: Dr. Evlyn Clines (oncology),  Dr. Carlyle Basques (infectious disease)  Dr. Greer Pickerel (surgery)  Culture  6/17 BLOOD ARM LEFT x 2 MSSA  6/17 Urine negative (final)  6/19 blood Port-A-Cath MSSA  6/20 blood left forearm/right arm neg  6/20 C. difficile by PCR negative    Antibiotics:  Cefepime 6/18>> stopped 6/19  Vacomycin 6/18>> stopped 6/20  Nafcillin 6/19>> stopped 6/24  Ancef 6/24>>    Discharge Exam: Filed Vitals:   05/30/14 0700  BP: 154/89  Pulse: 82  Temp: 98.7 F (37.1 C)  Resp: 16    General: more comfortable  Lungs: Clear to auscultation bilaterally  Cardiovascular: Regular rate and rhythm without murmur gallop or rub normal S1 and S2,  Abdomen: Nontender, nondistended, soft, bowel sounds positive, no rebound, no ascites, no appreciable mass  Extremities: No significant cyanosis, clubbing, or edema bilateral lower extremities   Discharge Instructions You were cared for by a hospitalist during your hospital stay. If you have any questions about your discharge  medications or the care you received while you were in the hospital after you are discharged, you can call the unit and asked to speak with the hospitalist on call if the hospitalist that took care of you is not available. Once you are discharged, your primary care physician will handle any further medical issues. Please note that NO REFILLS for any discharge medications will be authorized once you are discharged, as it is imperative that you return to your primary care physician (or establish a relationship with a primary care physician if you do not have one) for your aftercare needs so that they can reassess your need for medications and monitor your lab values.     Medication List    ASK your doctor about these medications       carvedilol 3.125 MG tablet  Commonly known as:  COREG  Take 1 tablet (3.125 mg total) by mouth 2 (two) times daily with a meal.     dexamethasone 4 MG tablet  Commonly known as:  DECADRON  Take 2 tablets (8 mg total) by mouth 2 (two) times daily. Start the day before Taxotere. Then again the day after chemo for 3 days.     ferrous sulfate 325 (65 FE) MG EC tablet  Take 1 tablet (325 mg total) by mouth 3 (three) times daily with meals.     ibuprofen 600 MG tablet  Commonly known as:  ADVIL,MOTRIN  Take 1 tablet (600 mg total) by mouth every 6 (six) hours as needed.     lidocaine-prilocaine cream  Commonly known as:  EMLA  Apply 1 application topically as needed (For port-a-cath.).     loratadine 10 MG tablet  Commonly known as:  CLARITIN  Take 10 mg by mouth daily as needed (For inflammation after receiving her injections during chemo.).     LORazepam 0.5 MG tablet  Commonly known as:  ATIVAN  Take 1 tablet (0.5 mg total) by mouth every 6 (six) hours as needed (Nausea or vomiting).     ondansetron 8 MG tablet  Commonly known as:  ZOFRAN  Take 1 tablet (8 mg total) by mouth 2 (two) times daily. Start the day after chemo for 3 days. Then take as needed  for nausea or vomiting.     oxycodone 5 MG capsule  Commonly known as:  OXY-IR  Take 1 tab every 4-6 hours as needed for pain     oxyCODONE-acetaminophen 5-325 MG per tablet  Commonly known as:  PERCOCET/ROXICET  Take 1 tablet by mouth every 4 (four) hours as needed for severe pain.     pantoprazole 40 MG tablet  Commonly known as:  PROTONIX  Take 1 tablet (40 mg total) by mouth daily. For stomach irritation.     PRESCRIPTION MEDICATION  She receives her chemo treatments at the [facility]. Her oncologist is Dr. Humphrey Rolls. She received her last chemo treatments of Taxotere 124m Injection, Carboplatin 8476m Herceptin 52561mnd Perjeta 420m34m 04/08/14. She also received Neulasta 6mg 11mection on 04/09/14.     prochlorperazine 10 MG tablet  Commonly known as:  COMPAZINE  Take 1 tablet (10 mg total) by mouth every 6 (six) hours as needed (Nausea  or vomiting).     promethazine 25 MG tablet  Commonly known as:  PHENERGAN  Take 1 tablet (25 mg total) by mouth every 6 (six) hours as needed for nausea or vomiting.       No Known Allergies Follow-up Information   Follow up with CORNETT,THOMAS A., MD. Schedule an appointment as soon as possible for a visit in 2 weeks.   Specialty:  General Surgery   Contact information:   [address] [place] [address] [phone]       Follow up with [facility].   Contact information:   [address] [place] [address] [phone]        The results of significant diagnostics from this hospitalization (including imaging, microbiology, ancillary and laboratory) are listed below for reference.    Significant Diagnostic Studies: Dg Chest 2 View  05/19/2014   CLINICAL DATA:  Chest pain.  Cough.  EXAM: CHEST  2 VIEW  COMPARISON:  CT chest 05/11/2014  FINDINGS: Power port catheter noted with tip projected over right atrium. Minimal subsegmental atelectasis/infiltrate right lung base  as noted on recent CT of 05/11/2014. Stable cardiomegaly with normal pulmonary vascularity. No pleural effusion or pneumothorax. No acute bony abnormality .  IMPRESSION: Minimal subsegmental atelectasis and/or infiltrate right lung bases as noted on recent CT of 05/11/2014. No other acute or focal abnormalities.   Electronically Signed   By: Marcello Moores  Register   On: 05/19/2014 16:17   Dg Chest 2 View  05/11/2014   CLINICAL DATA:  Mid chest pain for 4 days, history of breast malignancy.  EXAM: CHEST  2 VIEW  COMPARISON:  PA and lateral chest x-ray of Apr 13, 2014.  FINDINGS: The lungs are adequately inflated. There is no focal infiltrate. The heart is top-normal in size but stable. The pulmonary vascularity is not engorged. The Port-A-Cath appliance is unchanged. There is no pleural effusion. The bony thorax is unremarkable.  IMPRESSION: There is no acute cardiopulmonary disease.   Electronically Signed   By: David  Martinique   On: 05/11/2014 13:57   Dg Abd 1 View  05/28/2014   CLINICAL DATA:  43 year old female with nausea vomiting diarrhea. Intractable vomiting. Breast cancer undergoing chemotherapy.  EXAM: ABDOMEN - 1 VIEW  COMPARISON:  CT Abdomen and Pelvis 02/19/2014.  FINDINGS: Two supine views. Non obstructed bowel gas pattern. Stable visualized osseous structures. Small pelvic phleboliths. Abdominal and pelvic visceral contours are within normal limits.  IMPRESSION: Non obstructed bowel gas pattern.   Electronically Signed   By: Lars Pinks M.D.   On: 05/28/2014 20:44   Ct Angio Chest W/cm &/or Wo Cm  05/11/2014   CLINICAL DATA:  Shortness of breath and chest pain  EXAM: CT ANGIOGRAPHY CHEST WITH CONTRAST  TECHNIQUE: Multidetector CT imaging of the chest was performed using the standard protocol during bolus administration of intravenous contrast. Multiplanar CT image reconstructions and MIPs were obtained to evaluate the vascular anatomy.  CONTRAST:  16m OMNIPAQUE IOHEXOL 350 MG/ML SOLN  COMPARISON:  Chest  radiograph May 11, 2012 and chest CT Apr 13, 2014  FINDINGS: There is no demonstrable pulmonary embolus. There is no thoracic aortic aneurysm or dissection.  There has been slight partial clearing of infiltrate from the posterior right base compared to most recent prior CT examination. Some residual opacity remains in the posterior segment right lower lobe. The previously noted nodular opacity posterior left base currently measures 6 x 3 mm, smaller than on the  previous study. It is seen on axial slice 39, series 7. There is no new nodular opacity or airspace consolidation apparent. There is a minimal bullous disease in the right lower lobe.  Central catheter tip is in the right atrium. There is no appreciable pneumothorax.  There is no appreciable thoracic adenopathy. The pericardium is slightly thickened inferiorly, a stable finding. Heart is slightly enlarged.  Visualized upper abdominal structures appear unremarkable. There are no blastic or lytic bone lesions.  Review of the MIP images confirms the above findings.  IMPRESSION: No demonstrable pulmonary embolus.  The area of previously noted infiltrate in the posterior right base has shown mild partial but incomplete clearing. There is still residual consolidation in a small portion of the posterior segment of the right lower lobe.  The nodular opacity in the posterior left base has become slightly smaller.  No new areas of infiltrate or nodularity identified.  No adenopathy.  Heart is prominent but stable. No appreciable pericardial effusion. Slight inferior pericardial thickening is stable.   Electronically Signed   By: Lowella Grip M.D.   On: 05/11/2014 15:28   Mr Lumbar Spine W Wo Contrast  05/22/2014   CLINICAL DATA:  Breast cancer. Staph bacteremia. Infected Port-A-Cath removed May 21, 2014. New onset lumbar spine pain.  EXAM: MRI LUMBAR SPINE WITHOUT AND WITH CONTRAST  TECHNIQUE: Multiplanar and multiecho pulse sequences of the lumbar spine were  obtained without and with intravenous contrast.  CONTRAST:  52m MULTIHANCE GADOBENATE DIMEGLUMINE 529 MG/ML IV SOLN  COMPARISON:  None.  FINDINGS: The vertebral bodies of the lumbar spine are normal in size and alignment. The marrow is diffusely low in signal likely secondary to patient receiving chemotherapy. The intervertebral disc spaces are well-maintained. There are no areas of abnormal enhancement.  The spinal cord is normal in signal and contour. The cord terminates normally at L1 . The nerve roots of the cauda equina and the filum terminale are normal. There is no epidural fluid collection.  The visualized portions of the SI joints are unremarkable.  The imaged intra-abdominal contents are unremarkable.  T12-L1: No significant disc bulge. No evidence of neural foraminal stenosis. No central canal stenosis.  L1-L2: No significant disc bulge. No evidence of neural foraminal stenosis. No central canal stenosis.  L2-L3: No significant disc bulge. No evidence of neural foraminal stenosis. No central canal stenosis.  L3-L4: No significant disc bulge. No evidence of neural foraminal stenosis. No central canal stenosis.  L4-L5: No significant disc bulge. No evidence of neural foraminal stenosis. No central canal stenosis.  L5-S1: No significant disc bulge. No evidence of neural foraminal stenosis. No central canal stenosis.  IMPRESSION: 1. Normal MRI of the lumbar spine.   Electronically Signed   By: HKathreen Devoid  On: 05/22/2014 12:48   Dg Chest Port 1 View  05/28/2014   CLINICAL DATA:  PICC line placement  EXAM: PORTABLE CHEST - 1 VIEW  COMPARISON:  05/19/2014  FINDINGS: Borderline cardiomegaly. Mild basilar atelectasis. No pulmonary edema. There is left arm PICC line with tip in right atrium. For distal SVC position PICC line should be retracted about 1 cm. No pneumothorax.  IMPRESSION: Left arm PICC line with tip in right atrium. For distal SVC position the PICC line should be retracted about 1 cm.    Electronically Signed   By: LLahoma CrockerM.D.   On: 05/28/2014 10:58    Microbiology: Recent Results (from the past 240 hour(s))  CULTURE, ROUTINE-ABSCESS  Status: None   Collection Time    05/21/14  7:40 PM      Result Value Ref Range Status   Specimen Description ABSCESS PORTA CATH EXIT SITE   Final   Special Requests POF VANC MAXIPINE NAFICILLIN   Final   Gram Stain     Final   Value: RARE WBC PRESENT, PREDOMINANTLY PMN     NO ORGANISMS SEEN     Performed at [facility]   Culture     Final   Value: MODERATE STAPHYLOCOCCUS AUREUS     Note: RIFAMPIN AND GENTAMICIN SHOULD NOT BE USED AS SINGLE DRUGS FOR TREATMENT OF STAPH INFECTIONS. This organism is presumed to be Clindamycin resistant based on detection of inducible Clindamycin resistance.     Performed at [facility]   Report Status 05/25/2014 FINAL   Final   Organism ID, Bacteria STAPHYLOCOCCUS AUREUS   Final  ANAEROBIC CULTURE     Status: None   Collection Time    05/21/14  7:40 PM      Result Value Ref Range Status   Specimen Description ABSCESS PORTA CATH EXIT SITE   Final   Special Requests POF VANC MAXIPINE NAFICILLIN   Final   Gram Stain     Final   Value: RARE WBC PRESENT, PREDOMINANTLY PMN     NO ORGANISMS SEEN     Performed at [facility]   Culture     Final   Value: NO ANAEROBES ISOLATED     Performed at [facility]   Report Status 05/26/2014 FINAL   Final  CULTURE, BLOOD (ROUTINE X 2)     Status: None   Collection Time    05/22/14  1:00 PM      Result Value Ref Range Status   Specimen Description BLOOD LEFT FOREARM   Final   Special Requests BOTTLES DRAWN AEROBIC AND ANAEROBIC 5CC   Final   Culture  Setup Time     Final   Value: 05/22/2014 18:54     Performed at [facility]   Culture     Final   Value: NO GROWTH 5 DAYS     Performed at [facility]   Report Status 05/28/2014 FINAL   Final  CULTURE, BLOOD (ROUTINE X 2)     Status: None   Collection  Time    05/22/14  2:09 PM      Result Value Ref Range Status   Specimen Description BLOOD RIGHT ARM   Final   Special Requests BOTTLES DRAWN AEROBIC AND ANAEROBIC 10CC   Final   Culture  Setup Time     Final   Value: 05/22/2014 18:54     Performed at [facility]   Culture     Final   Value: NO GROWTH 5 DAYS     Performed at [facility]   Report Status 05/28/2014 FINAL   Final  CLOSTRIDIUM DIFFICILE BY PCR     Status: None   Collection Time    05/22/14  2:14 PM      Result Value Ref Range Status   C difficile by pcr NEGATIVE  NEGATIVE Final  CLOSTRIDIUM DIFFICILE BY PCR     Status: None   Collection Time    05/26/14 12:50 AM      Result Value Ref Range Status   C difficile by pcr NEGATIVE  NEGATIVE Final     Labs: Basic Metabolic Panel:  Recent Labs Lab 05/24/14 0229  05/25/14 0330 05/26/14 0601 05/27/14 0213 05/28/14 0130 05/29/14 0525  NA 142 143 143 142 141 143  K 3.2* 3.2* 3.8 3.4* 3.2* 3.6*  CL 103 100 103 102 100 102  CO2 25 28 28 26 26 27   GLUCOSE 120* 99 102* 98 108* 97  BUN 4* 3* 5* 5* 5* 7  CREATININE 0.77 0.75 0.82 0.80 0.76 0.75  CALCIUM 8.5 9.0 9.3 9.0 9.3 9.2  MG 1.9 1.7 2.1  --  1.8  --    Liver Function Tests:  Recent Labs Lab 05/24/14 0229 05/25/14 0330 05/26/14 0601 05/28/14 1652  AST 17 23 18 12   ALT 32 31 25 13   ALKPHOS 98 87 92 81  BILITOT 0.3 0.3 0.4 0.3  PROT 5.9* 6.3 6.8 7.3  ALBUMIN 2.1* 2.3* 2.5* 2.5*    Recent Labs Lab 05/28/14 1652  LIPASE 56   No results found for this basename: AMMONIA,  in the last 168 hours CBC:  Recent Labs Lab 05/24/14 0229 05/25/14 0330 05/26/14 0530 05/27/14 0500 05/29/14 0525 05/30/14 0630  WBC 11.0* 10.1 9.8 7.9 7.7 7.5  NEUTROABS 7.7  --   --   --   --   --   HGB 8.0* 7.9* 9.3* 8.8* 9.2* 8.9*  HCT 24.0* 24.4* 28.6* 27.0* 29.7* 28.7*  MCV 93.8 95.7 93.2 94.1 97.1 97.3  PLT 217 235 200 184 189 188   Cardiac Enzymes: No results found for this basename: CKTOTAL,  CKMB, CKMBINDEX, TROPONINI,  in the last 168 hours BNP: BNP (last 3 results)  Recent Labs  05/11/14 1318  PROBNP 173.9*   CBG: No results found for this basename: GLUCAP,  in the last 168 hours     Signed:  Ferris  [facility] 05/30/2014, 2:03 PM

## 2014-05-30 NOTE — ED Notes (Signed)
The pt was discharged from this hospital earlier today.  She was supposed to be getting lovenox shots for pes on the rt side of her chest.  She has breast cancer also.  When she took her rx for lovenox she was unable to get it filled.  So she was told to return here to the ed to get her 2200 shot.  She is very angry and upset

## 2014-05-30 NOTE — ED Provider Notes (Signed)
CSN: [id number]     Arrival date & time 05/30/14  2213 History  This chart was scribed for non-physician practitioner, Carlisle Cater, PA-C, working with Merryl Hacker, MD by Roe Coombs, ED Scribe. This patient was seen in room TR08C/TR08C and the patient's care was started at 10:39 PM.    Chief Complaint  Patient presents with  . needs lovenox injecton     The history is provided by the patient. No language interpreter was used.    HPI Comments: Tamara [name] is a 43 y.o. female who presents to the Emergency Department requesting a Lovenox injection. Patient was discharged from the hospital today after being admitted for treatment of Staphylococcus aureus bacteremia with sepsis and infection due to port-a-cath. She also had a Doppler study on 05/21/14 which revealed a DVT involving right internal jugular and subclavian veins. Patient was receiving prophylactic Lovenox injections prior to her diagnosis with Doppler and was continued during her hospitalization. She is supposed to take Lovenox 95 mg every 12 hours, however, upon discharge patient was unable get her prescription filled at any local pharmacies. She was advised to return to the hospital for her 10:00 PM injection. Patient has breast cancer and is currently undergoing chemotherapy. She denies chest pain, shortness of breath, cough, fever, difficulty urinating, abdominal pain, vomiting, diarrhea, nausea, or physical symptoms at this time. Her other medical history includes HTN.   Past Medical History  Diagnosis Date  . Endometriosis   . Hypertension   . Rash     fine rash on abd  . Anemia   . Wears glasses   . 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 Iron deficiency anemia, unspecified 02/25/2014  . BRCA1 positive     c.190T>G (p.Cys64Gly) @ [facility]    Past Surgical History  Procedure Laterality Date  . Cervical polypectomy  2010  . Cesarean section      one previous  . Tubal ligation    . Portacath placement Right  02/23/2014    Procedure: INSERTION PORT-A-CATH;  Surgeon: Joyice Faster. Cornett, MD;  Location: [facility];  Service: General;  Laterality: Right;  . Port-a-cath removal N/A 05/21/2014    Procedure: REMOVAL PORT-A-CATH;  Surgeon: Zenovia Jarred, MD;  Location: [facility];  Service: General;  Laterality: N/A;  . Tee without cardioversion N/A 05/26/2014    Procedure: TRANSESOPHAGEAL ECHOCARDIOGRAM (TEE);  Surgeon: Larey Dresser, MD;  Location: [facility];  Service: Cardiovascular;  Laterality: N/A;   Family History  Problem Relation Age of Onset  . Breast cancer Mother 33    currently 68  . Diabetes Father   . Pancreatic cancer Father 73  . Breast cancer Paternal Aunt 33    currently 64; BRCA1 positive  . Stroke Maternal Grandfather   . Cancer Paternal Aunt     unk. primary; deceased 74s  . Breast cancer Cousin     daughter of unaffected paternal aunt; dx in her 54s   History  Substance Use Topics  . Smoking status: Former Smoker -- 0.25 packs/day for 15 years    Quit date: 02/18/2009  . Smokeless tobacco: Former Systems developer  . Alcohol Use: Yes     Comment: occasional   OB History   Grav Para Term Preterm Abortions TAB SAB Ect Mult Living   5 5 5       5      Review of Systems  Constitutional: Negative for fever and chills.  Respiratory: Negative for shortness of breath.  Cardiovascular: Negative for chest pain.  Gastrointestinal: Negative for nausea, vomiting, abdominal pain and diarrhea.  Genitourinary: Negative for difficulty urinating.  Neurological: Negative for headaches.    Allergies  Review of patient's allergies indicates no known allergies.  Home Medications   Prior to Admission medications   Medication Sig Start Date End Date Taking? Authorizing Abdalla Naramore  carvedilol (COREG) 3.125 MG tablet Take 1 tablet (3.125 mg total) by mouth 2 (two) times daily with a meal. 05/05/14   Jolaine Artist, MD  ceFAZolin (ANCEF) 2-3 GM-% SOLR Inject 50 mLs (2 g total) into  the vein every 8 (eight) hours. 05/30/14 06/05/14  Debbe Odea, MD  cloNIDine (CATAPRES) 0.2 MG tablet Take 1 tablet (0.2 mg total) by mouth 2 (two) times daily. 05/30/14   Debbe Odea, MD  dexamethasone (DECADRON) 4 MG tablet Take 2 tablets (8 mg total) by mouth 2 (two) times daily. Start the day before Taxotere. Then again the day after chemo for 3 days. 02/16/14   Deatra Robinson, MD  enoxaparin (LOVENOX) 100 MG/ML injection Inject 0.95 mLs (95 mg total) into the skin every 12 (twelve) hours. 05/30/14   Debbe Odea, MD  ferrous sulfate 325 (65 FE) MG EC tablet Take 1 tablet (325 mg total) by mouth 3 (three) times daily with meals. 03/04/14   Minette Headland, NP  hydrALAZINE (APRESOLINE) 50 MG tablet Take 1 tablet (50 mg total) by mouth every 8 (eight) hours. 05/30/14   Debbe Odea, MD  ibuprofen (ADVIL,MOTRIN) 600 MG tablet Take 1 tablet (600 mg total) by mouth every 6 (six) hours as needed. 05/11/14   Merryl Hacker, MD  lidocaine-prilocaine (EMLA) cream Apply 1 application topically as needed (For port-a-cath.).    Historical Donzella Carrol, MD  lisinopril (PRINIVIL,ZESTRIL) 40 MG tablet Take 1 tablet (40 mg total) by mouth daily. 05/30/14   Debbe Odea, MD  loratadine (CLARITIN) 10 MG tablet Take 10 mg by mouth daily as needed (For inflammation after receiving her injections during chemo.).     Historical Ahmod Gillespie, MD  LORazepam (ATIVAN) 0.5 MG tablet Take 1 tablet (0.5 mg total) by mouth every 6 (six) hours as needed (Nausea or vomiting). 02/16/14   Deatra Robinson, MD  ondansetron (ZOFRAN) 8 MG tablet Take 1 tablet (8 mg total) by mouth 2 (two) times daily. Start the day after chemo for 3 days. Then take as needed for nausea or vomiting. 02/16/14   Deatra Robinson, MD  oxycodone (OXY-IR) 5 MG capsule Take 1 tab every 4-6 hours as needed for pain 05/14/14   Lennis Marion Downer, MD  oxyCODONE-acetaminophen (PERCOCET/ROXICET) 5-325 MG per tablet Take 1 tablet by mouth every 4 (four) hours as needed for severe  pain.     Historical Alila Sotero, MD  pantoprazole (PROTONIX) 40 MG tablet Take 1 tablet (40 mg total) by mouth daily. For stomach irritation. 05/14/14   Lennis Marion Downer, MD  PRESCRIPTION MEDICATION She receives her chemo treatments at the [facility]. Her oncologist is Dr. Humphrey Rolls. She received her last chemo treatments of Taxotere 121m Injection, Carboplatin 8453m Herceptin 52558mnd Perjeta 420m77m 04/08/14. She also received Neulasta 6mg 2mection on 04/09/14.    Historical Clinton Wahlberg, MD  prochlorperazine (COMPAZINE) 10 MG tablet Take 1 tablet (10 mg total) by mouth every 6 (six) hours as needed (Nausea or vomiting). 02/16/14   KalsoDeatra Robinson promethazine (PHENERGAN) 25 MG tablet Take 1 tablet (25 mg total) by mouth every 6 (six) hours as  needed for nausea or vomiting. 03/25/14   Minette Headland, NP   Triage Vitals: BP 228/115  Pulse 86  Temp(Src) 98.1 F (36.7 C) (Oral)  SpO2 97%  LMP 02/19/2014  Physical Exam  Nursing note and vitals reviewed. Constitutional: She appears well-developed and well-nourished. No distress.  HENT:  Head: Normocephalic and atraumatic.  Eyes: Conjunctivae and EOM are normal.  Neck: Neck supple. No tracheal deviation present.  Cardiovascular: Normal rate.   Pulmonary/Chest: Effort normal. No respiratory distress.  Musculoskeletal: Normal range of motion.  Neurological: She is alert.  Skin: Skin is warm and dry.  Psychiatric: She has a normal mood and affect. Her behavior is normal.    ED Course  Procedures (including critical care time) DIAGNOSTIC STUDIES: Oxygen Saturation is 97% on room air, adequate by my interpretation.    COORDINATION OF CARE: 10:48 PM- Patient informed of current plan for treatment and evaluation and agrees with plan at this time.  Patient Vitals for the past 24 hrs:  BP Temp Temp src Pulse SpO2  05/30/14 2237 228/115 mmHg - - - -  05/30/14 2236 218/125 mmHg 98.1 F (36.7 C) Oral 86 97 %   Note: BP is  likely elevated however patient has restricted upper extremities. Her blood pressure was taken in her leg, over her jeans, with inappropriate cuff, and is likely reading higher than actual BP. She is also angry and frustrated upon arrival and this is likely contributing. Her BP was elevated to lesser extent at discharge today. She is asymptomatic from standpoint of BP./   MDM   Final diagnoses:  DVT (deep venous thrombosis), unspecified laterality   Patient has received her Lovenox injection. Plan is for patient to go to [facility] and wellness tomorrow morning so that she can definitely receive her medications. Patient agrees with this plan and has been seen there in the past.  I personally performed the services described in this documentation, which was scribed in my presence. The recorded information has been reviewed and is accurate.    Carlisle Cater, PA-C 05/31/14 [phone]

## 2014-05-30 NOTE — ED Notes (Signed)
Pt alert, NAD, calm, interactive, resps e/u, speaking in clear complete sentences, VSS. BP elevated d/t h/o htn, also taking BP on calves thru jeans d/t LUE PICC & RUE "clots". Took BP meds PTA. Denies cpain or CP, admits to "a little sob d/t anemia". Denies other sx. Also (denies: nvd, fever, dizziness or bleeding).

## 2014-05-30 NOTE — Discharge Instructions (Signed)
Please read and follow all provided instructions.  Your diagnoses today include:  1. DVT (deep venous thrombosis), unspecified laterality     Tests performed today include:  Vital signs. See below for your results today.   Medications prescribed:   None  Home care instructions:  Follow any educational materials contained in this packet.  Follow-up instructions: Go to [facility] and Wellness tomorrow morning to address your medication issue.   Return instructions:   Please return to the Emergency Department if you experience worsening symptoms.   Please return if you have any other emergent concerns.  Additional Information:  Your vital signs today were: BP 218/107   Pulse 83   Temp(Src) 98.5 F (36.9 C) (Oral)   Resp 18   SpO2 97%   LMP 02/19/2014 If your blood pressure (BP) was elevated above 135/85 this visit, please have this repeated by your doctor within one month. ---------------

## 2014-05-30 NOTE — Progress Notes (Signed)
Ms. Makepeace was d/c today. Ms. Santizo called the nurse's station via telephone tonight. She states that none of the pharmacies has the Lovenox injections in stock and she is suppose to give herself the injection at 10 pm tonight. Notified Rogue Bussing, NP on call tonight. Rogue Bussing, NP stated for pt to try [facility]. Pt states that [facility] pharmacies closes at [place] on Sunday nights. NP stated to tell patient that she needed the injection and to come to the ED to receive the injection tonight. Called pt back, told her that NP stated to go to ED tonight to get injection. Pt stated okay. Ranelle Oyster, RN

## 2014-05-30 NOTE — Progress Notes (Signed)
Reviewed discharge paperwork with patient and her husband.  Instructions included medication times, care of PICC line, doctor's appointments, chemotherapy appointments, and instructions on changing her right subclavian dressing with teach back method utilized to ascertain comprehension of instructions.  Patient discharged to home via private vehicle with husband to private residence.  Discharged via wheelchair escorted by nurse tech.

## 2014-05-31 ENCOUNTER — Telehealth: Payer: Self-pay | Admitting: Oncology

## 2014-05-31 ENCOUNTER — Encounter (INDEPENDENT_AMBULATORY_CARE_PROVIDER_SITE_OTHER): Payer: Medicaid Other | Admitting: Surgery

## 2014-05-31 ENCOUNTER — Telehealth: Payer: Self-pay | Admitting: *Deleted

## 2014-05-31 ENCOUNTER — Other Ambulatory Visit: Payer: Self-pay | Admitting: *Deleted

## 2014-05-31 NOTE — Telephone Encounter (Signed)
Called to check on patient status since discharge. Encouraged patient to call us back if she is having any issues with injections, home health or appts coming back to see [place]. Instructed to call and ask to speak to this nurse or Dr Camila Li nurse.

## 2014-05-31 NOTE — Telephone Encounter (Signed)
per pof tio cancel lab per [facility] 7/9 inj 7/10-give pt updated sch when pt comes in 7/2-will callto adv pt of new sch

## 2014-05-31 NOTE — Telephone Encounter (Signed)
cld pt to adv that trmts, lab had been CX per [facility] & inj on 7/10 had been cx. Gave pt next appt time for 7/2-adv pt to stop by sch to get updated sch

## 2014-05-31 NOTE — Telephone Encounter (Signed)
Message copied by Verlon Setting on Mon May 31, 2014 11:17 AM ------      Message from: Evlyn Clines P      Created: Mon May 31, 2014 10:32 AM       Amy -- DCd this weekend after staph sepsis from [facility].  Hopefully has [facility] for IV antibiotics and to care for PICC this week.      I am to see her THurs with chemo same day if stable.      Please can you check on her by phone today      thx      Lennis ------

## 2014-05-31 NOTE — ED Provider Notes (Signed)
Medical screening examination/treatment/procedure(s) were performed by non-physician practitioner and as supervising physician I was immediately available for consultation/collaboration.   EKG Interpretation None        Merryl Hacker, MD 05/31/14 1931

## 2014-06-03 ENCOUNTER — Encounter: Payer: Self-pay | Admitting: Oncology

## 2014-06-03 ENCOUNTER — Ambulatory Visit: Payer: Medicaid Other | Admitting: Oncology

## 2014-06-03 ENCOUNTER — Other Ambulatory Visit (HOSPITAL_BASED_OUTPATIENT_CLINIC_OR_DEPARTMENT_OTHER): Payer: Medicaid Other

## 2014-06-03 ENCOUNTER — Ambulatory Visit (HOSPITAL_BASED_OUTPATIENT_CLINIC_OR_DEPARTMENT_OTHER): Payer: Medicaid Other | Admitting: Oncology

## 2014-06-03 ENCOUNTER — Telehealth: Payer: Self-pay | Admitting: Oncology

## 2014-06-03 ENCOUNTER — Ambulatory Visit: Payer: Medicaid Other

## 2014-06-03 ENCOUNTER — Ambulatory Visit: Payer: Medicaid Other | Admitting: Adult Health

## 2014-06-03 ENCOUNTER — Other Ambulatory Visit: Payer: Medicaid Other

## 2014-06-03 ENCOUNTER — Ambulatory Visit (HOSPITAL_BASED_OUTPATIENT_CLINIC_OR_DEPARTMENT_OTHER): Payer: Medicaid Other

## 2014-06-03 ENCOUNTER — Other Ambulatory Visit: Payer: Self-pay | Admitting: *Deleted

## 2014-06-03 VITALS — BP 131/87 | HR 89 | Temp 98.5°F | Resp 18 | Ht 65.0 in | Wt 190.6 lb

## 2014-06-03 DIAGNOSIS — C50419 Malignant neoplasm of upper-outer quadrant of unspecified female breast: Secondary | ICD-10-CM

## 2014-06-03 DIAGNOSIS — Z95828 Presence of other vascular implants and grafts: Secondary | ICD-10-CM

## 2014-06-03 DIAGNOSIS — C50412 Malignant neoplasm of upper-outer quadrant of left female breast: Secondary | ICD-10-CM

## 2014-06-03 DIAGNOSIS — Z171 Estrogen receptor negative status [ER-]: Secondary | ICD-10-CM

## 2014-06-03 DIAGNOSIS — D649 Anemia, unspecified: Secondary | ICD-10-CM

## 2014-06-03 DIAGNOSIS — B370 Candidal stomatitis: Secondary | ICD-10-CM

## 2014-06-03 DIAGNOSIS — Z452 Encounter for adjustment and management of vascular access device: Secondary | ICD-10-CM

## 2014-06-03 DIAGNOSIS — I82409 Acute embolism and thrombosis of unspecified deep veins of unspecified lower extremity: Secondary | ICD-10-CM

## 2014-06-03 DIAGNOSIS — B958 Unspecified staphylococcus as the cause of diseases classified elsewhere: Secondary | ICD-10-CM

## 2014-06-03 DIAGNOSIS — C773 Secondary and unspecified malignant neoplasm of axilla and upper limb lymph nodes: Secondary | ICD-10-CM

## 2014-06-03 LAB — COMPREHENSIVE METABOLIC PANEL (CC13)
ALT: 6 U/L (ref 0–55)
ANION GAP: 10 meq/L (ref 3–11)
AST: 10 U/L (ref 5–34)
Albumin: 2.9 g/dL — ABNORMAL LOW (ref 3.5–5.0)
Alkaline Phosphatase: 74 U/L (ref 40–150)
BILIRUBIN TOTAL: 0.3 mg/dL (ref 0.20–1.20)
BUN: 10 mg/dL (ref 7.0–26.0)
CALCIUM: 9.8 mg/dL (ref 8.4–10.4)
CHLORIDE: 105 meq/L (ref 98–109)
CO2: 25 mEq/L (ref 22–29)
CREATININE: 0.8 mg/dL (ref 0.6–1.1)
Glucose: 127 mg/dl (ref 70–140)
Potassium: 3.8 mEq/L (ref 3.5–5.1)
SODIUM: 141 meq/L (ref 136–145)
TOTAL PROTEIN: 7.7 g/dL (ref 6.4–8.3)

## 2014-06-03 LAB — CBC WITH DIFFERENTIAL/PLATELET
BASO%: 0.3 % (ref 0.0–2.0)
Basophils Absolute: 0 10*3/uL (ref 0.0–0.1)
EOS%: 0.9 % (ref 0.0–7.0)
Eosinophils Absolute: 0.1 10*3/uL (ref 0.0–0.5)
HCT: 31.1 % — ABNORMAL LOW (ref 34.8–46.6)
HGB: 9.7 g/dL — ABNORMAL LOW (ref 11.6–15.9)
LYMPH#: 1.5 10*3/uL (ref 0.9–3.3)
LYMPH%: 21.5 % (ref 14.0–49.7)
MCH: 30.6 pg (ref 25.1–34.0)
MCHC: 31.2 g/dL — AB (ref 31.5–36.0)
MCV: 98.1 fL (ref 79.5–101.0)
MONO#: 0.4 10*3/uL (ref 0.1–0.9)
MONO%: 5.7 % (ref 0.0–14.0)
NEUT#: 4.9 10*3/uL (ref 1.5–6.5)
NEUT%: 71.6 % (ref 38.4–76.8)
Platelets: 328 10*3/uL (ref 145–400)
RBC: 3.17 10*6/uL — AB (ref 3.70–5.45)
RDW: 18.7 % — ABNORMAL HIGH (ref 11.2–14.5)
WBC: 6.8 10*3/uL (ref 3.9–10.3)

## 2014-06-03 MED ORDER — SODIUM CHLORIDE 0.9 % IJ SOLN
10.0000 mL | INTRAMUSCULAR | Status: DC | PRN
  Administered 2014-06-03: 10 mL via INTRAVENOUS
  Filled 2014-06-03: qty 10

## 2014-06-03 MED ORDER — FLUCONAZOLE 100 MG PO TABS: ORAL_TABLET | ORAL | Status: DC

## 2014-06-03 MED ORDER — HEPARIN SOD (PORK) LOCK FLUSH 100 UNIT/ML IV SOLN
500.0000 [IU] | Freq: Once | INTRAVENOUS | Status: AC
  Administered 2014-06-03: 250 [IU] via INTRAVENOUS
  Filled 2014-06-03: qty 5

## 2014-06-03 NOTE — Telephone Encounter (Signed)
cld & left message for Tamara [name] scheduler for hospital f/u to sch appt for week of 7/6-adv i would be out of office returning on 7/7-adv to sch appt and call pt with time & date-if any addtl info needed to call [phone]-all other appts sch for pt-gave MRN

## 2014-06-03 NOTE — Telephone Encounter (Signed)
s/w pt re confirming next appt for 7/6. pt to contact dr Caren Griffins snider's office for appt and has office info.

## 2014-06-03 NOTE — Telephone Encounter (Signed)
Pt aware of 7/6 appt w/[facility]/cld & left message for Kim scheduler for hospital f/u to sch appt for week of 7/6-adv i would be out of office returning on 7/7-adv to sch appt and call pt with time & date-if any addtl info needed to call [phone]-all other appts sch for pt-gave MRN

## 2014-06-03 NOTE — Progress Notes (Signed)
Patient came in for labs via PICC, patient complained of PICC bleeding at site after her dressing was changed on Monday at home. Fresh Blood noticed at PICC insertion site.  Patient stated she has some discomfort in her upper left arm above PICC insertion. Patient has no blood return out of red lumen, and good blood return out of purple lumen. Patient states she just flushed both lumens today and uses the purple one for her antibiotic. Tammi,RN with Dr.Livesay was called and informed. Tammi came and checked on patients PICC line, Patient to see Dr.Livesay for further orders. Okay to draw labs and flush purple lumen. Patient walked over to Breast side for appt. With Dr.Livesay.

## 2014-06-03 NOTE — Progress Notes (Signed)
OFFICE PROGRESS NOTE   06/03/2014   Physicians:(K.Khan), T.Cornett, D.Bensimhon, C.Sanger, J.Kinard, C.Snider, O.Jegede (PCP)   INTERVAL HISTORY:  Patient is seen, together with husband, for first time back at this office since hospitalization 05-19-14 thru 05-30-14, for methicillin sensitive staph aureus sepsis from infected PAC. The PAC was removed by Dr Georganna Skeans on 05-21-14, TEE on 05-26-14 was negative and follow up blood cultures also were negative prior to placement of PICC in LUE. She is continuing Ancef 2 gm q 8 hrs via the PICC; I understood antibiotics were to complete on 06-05-14, tho patient believes this is thru 06-08-14. She is also doing dressing changes to PAC site bid, no longer large enough to pack; she has not been washing the site, and I have asked her to do that in shower 1-2x daily. She was transfused 3 units PRBCs in hospital for symptomatic anemia. She is on bid lovenox for right jugular DVT related to the [facility].   Dr Baxter Flattery had planned to see her next week to be sure ok to DC antibiotics, and we will try to schedule that appointment. She has not had chemo since cycle 4 carboplatin, taxotere, herceptin and pertuzumab on 05-13-14; MRI breasts planned for 06-14-14 will need to move out due to delay in chemo.  Appointment back to Dr Brantley Stage will need to be rescheduled also.    She has had no fever, checking temperature daily,  and no bleeding. The PICC did not flush easily this AM at home, but no problems with flush and dressing change by RN now. PICC is not uncomfortable and no swelling now LUE around PICC or in hand/forearm where IV infiltration in hospital. She is still fatigued with walking room to room at home. Appetite is some better, tho taste is bad, is drinking Ensure qid as well as water and OJ. Bowels are moving daily without diarrhea now (C.diff negative in hospital). She complains of some difficulty with vision, which may be related to prochlorperazine, and also is aware of  speech hesitancy which she believes began in hospital but has continued. She denies HA now, tho had HA in hospital with ~ 24 hours of very elelvated BP, no other neurologic symptoms.     ONCOLOGIC HISTORY Patient found a left breast mass in the upper outer quadrant, evaluated with mammogram at [facility] 01-18-14 with a 3.2 cm mass in the 2:00 position 7 cm from the nipple. There was also an 8 mm mass 8 cm from the nipple in the ipsilateral breast, as well as positive lymph nodes. 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 MRI confirmed 2.8 and 1.3 cm mass is as well as lymph node. On the right a 6 mm nodule, biopsied and negative for malignancy.  PET scan 3-84-66 had hypermetabolic left breast mass consistent with known neoplasm and FDG positive left axillary lymph nodes,benign-appearing brown fat activity and muscular activity in the  neck and chest but no findings for metastatic disease involving the  neck, chest, abdomen, pelvis or bones. Moderate FDG activity in the endometrial canal is thought likely due to secretory phase of ovulation or menses. No mass, uterine fibroids present. CT CAP 02-19-14 had 3 cm left breast mass, enlarged left axillary lymph nodes are positive and no CT findings for metastatic disease involving the chest,  abdomen or pelvis and  no evidence of osseous metastatic disease. Mildly enlarged fibroid uterus.  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 Patient was seen by cardiology and they have cleared her for chemotherapy with anti-HER-2-based regimen  #5 neoadjuvant chemotherapy with Taxotere carboplatinum Herceptin and perjeta every 3 weeks planned for a total of 6 cycles.Cycle 1 began 02-25-14.  Review of  systems as above, also: No bladder problems. Sleeping ok. No pain. Some mild nausea, had used prochlorperazine (?) but will use zofran only for now. No swelling LE. No SOB at rest. No cough or chest pain. Vision good now. Remainder of 10 point Review of Systems negative.  Objective:  Vital signs in last 24 hours:  BP 131/87  Pulse 89  Temp(Src) 98.5 F (36.9 C) (Oral)  Resp 18  Ht _0  (1.651 m)  Wt 190 lb 9.6 oz (86.456 kg)  BMI 31.72 kg/m2  LMP 02/19/2014 weight is down 7.5 lbs from 05-21-14. Ambulatory slowly, able to get on and off exam table with minimal assistance.  Alert, oriented and appropriate.Speech has some hesitancy, otherwise entirely appropriate. Alopecia  HEENT:PERRL, sclerae not icteric. Oral mucosa moist with thrush tongue and slight on posterior buccal mucosa, posterior pharynx clear.  Neck supple, no longer tender right where DVT initially very uncomfortable. No JVD.  Lymphatics:no cervical,suraclavicular, axillary or inguinal adenopathy Resp: clear to auscultation bilaterally and normal percussion bilaterally Cardio: regular rate and rhythm. No gallop. Site of prior PAC on right chest wall ~ 7 mm wide and ~ 5 mm deep, with pink granulation tissue with some minimal whitish exudate. No surrounding erythema or tenderness.  GI: soft, nontender, not distended, no mass or organomegaly. Normally active bowel sounds. Musculoskeletal/ Extremities: without pitting edema, cords, tenderness including LUE. Double lumen PICC left upper arm redressed and flushed easily by RN now, site ok. Neuro: speech hesitancy, otherwise no focal deficits. PSYCH: mood and affect appropriate Skin without rash, ecchymosis, petechiae Breasts: examined last week in hospital, nothing palpable in left breast or left axilla.  Lab Results:  Results for orders placed in visit on 06/03/14  CBC WITH DIFFERENTIAL      Result Value Ref Range   WBC 6.8  3.9 - 10.3 10e3/uL   NEUT# 4.9  1.5 - 6.5  10e3/uL   HGB 9.7 (*) 11.6 - 15.9 g/dL   HCT 31.1 (*) 34.8 - 46.6 %   Platelets 328  145 - 400 10e3/uL   MCV 98.1  79.5 - 101.0 fL   MCH 30.6  25.1 - 34.0 pg   MCHC 31.2 (*) 31.5 - 36.0 g/dL   RBC 3.17 (*) 3.70 - 5.45 10e6/uL   RDW 18.7 (*) 11.2 - 14.5 %   lymph# 1.5  0.9 - 3.3 10e3/uL   MONO# 0.4  0.1 - 0.9 10e3/uL   Eosinophils Absolute 0.1  0.0 - 0.5 10e3/uL   Basophils Absolute 0.0  0.0 - 0.1 10e3/uL   NEUT% 71.6  38.4 - 76.8 %   LYMPH% 21.5  14.0 - 49.7 %   MONO% 5.7  0.0 - 14.0 %   EOS% 0.9  0.0 - 7.0 %   BASO% 0.3  0.0 - 2.0 %  COMPREHENSIVE METABOLIC PANEL (NW29)      Result Value Ref Range   Sodium 141  136 - 145 mEq/L   Potassium 3.8  3.5 - 5.1 mEq/L   Chloride 105  98 - 109 mEq/L   CO2 25  22 - 29 mEq/L  Glucose 127  70 - 140 mg/dl   BUN 10.0  7.0 - 26.0 mg/dL   Creatinine 0.8  0.6 - 1.1 mg/dL   Total Bilirubin 0.30  0.20 - 1.20 mg/dL   Alkaline Phosphatase 74  40 - 150 U/L   AST 10  5 - 34 U/L   ALT 6  0 - 55 U/L   Total Protein 7.7  6.4 - 8.3 g/dL   Albumin 2.9 (*) 3.5 - 5.0 g/dL   Calcium 9.8  8.4 - 10.4 mg/dL   Anion Gap 10  3 - 11 mEq/L     Studies/Results:  No results found. Did not have imaging of head in hospital. MRI LS 05-22-14 negative for infection or metastatic disease  Medications: I have reviewed the patient's current medications. I have asked her to stop prochlorperazine (compazine) in case this is causing some slight dystonic reaction with the hesitant speech; she will use zofran instead prn for now. Diflucan prescription sent to pharmacy for oral candida. I spoke with [facility], who confirm that the home antibiotic is Ancef 2 gm q 8 hr.   DISCUSSION: Patient has not recovered adequately from recent MSSA to resume chemo with cycle 5 today; infection was at least bacteremia, tho I believe she was in early sepsis by the evening that PAC was removed. I will see her again on 06-07-14 and hopefully can schedule #5 chemo then (with IVF  + neulasta day following). She will need PICC flushes and dressing changes at [facility] after home antibiotics complete. If speech problems persist off of compazine, will need to image head (did not discuss today). She has been instructed to call and go to ED over this weekend if speech worsens, fever or other problems.  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 Due cycle 5 on 05-27-14, which has been delayed due to staph infection. Plan repeat breast MRI after neoadjuvant chemo completes, then left breast surgery. BRCA 1 positive. 2.Methicillin sensitive staph aureus sepsis from [facility] infection: PAC removed 05-21-14, TEE negative. Dr Baxter Flattery wanted  follow up in ID clinic week of July 6 to be sure no more antibiotics needed. 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 Apparently has large # of these syringes, so will not change dosing to once daily with new prescription now. She has had some difficulty giving these to herself, instructed again by RN now. 5.oral candida: diflucan to begin today 6.diarrhea in hospital resolved, C diff negative 7.Speech hesitancy: possibly some dystonic reaction from compazine, vs related to severe HA and HTN toward end of hospitalization. Will image head if not resolved next week, or if worsens prior. 8.anemia: transfused 3 units PRBCs in hospital, also had IV iron at [facility] previously 9.overdue gyn exam, with some uptake on PET possibly due to phase of ovulation, but will need gyn evaluation as soon as rest of situation allows. 10. Poor po intake: encouraged her to continue Ensure for now   Patient and husband understood discussion, instructions and plan. They  know to call if questions or concerns prior to follow up early next week.  Time spent 40 min, >50% counseling and coordination of care.   Sandhya Denherder P, MD   06/03/2014, 2:00 PM

## 2014-06-05 ENCOUNTER — Ambulatory Visit: Payer: Medicaid Other

## 2014-06-06 ENCOUNTER — Other Ambulatory Visit: Payer: Self-pay | Admitting: Oncology

## 2014-06-07 ENCOUNTER — Other Ambulatory Visit (HOSPITAL_BASED_OUTPATIENT_CLINIC_OR_DEPARTMENT_OTHER): Payer: Medicaid Other

## 2014-06-07 ENCOUNTER — Ambulatory Visit: Payer: Medicaid Other | Admitting: Oncology

## 2014-06-07 ENCOUNTER — Ambulatory Visit (HOSPITAL_BASED_OUTPATIENT_CLINIC_OR_DEPARTMENT_OTHER): Payer: Medicaid Other

## 2014-06-07 ENCOUNTER — Other Ambulatory Visit: Payer: Medicaid Other

## 2014-06-07 ENCOUNTER — Ambulatory Visit (HOSPITAL_BASED_OUTPATIENT_CLINIC_OR_DEPARTMENT_OTHER): Payer: Medicaid Other | Admitting: Oncology

## 2014-06-07 ENCOUNTER — Encounter: Payer: Self-pay | Admitting: Oncology

## 2014-06-07 VITALS — BP 130/86 | HR 84 | Temp 98.4°F | Resp 20 | Ht 65.0 in | Wt 190.5 lb

## 2014-06-07 DIAGNOSIS — C50412 Malignant neoplasm of upper-outer quadrant of left female breast: Secondary | ICD-10-CM

## 2014-06-07 DIAGNOSIS — B370 Candidal stomatitis: Secondary | ICD-10-CM

## 2014-06-07 DIAGNOSIS — Z171 Estrogen receptor negative status [ER-]: Secondary | ICD-10-CM

## 2014-06-07 DIAGNOSIS — I82C19 Acute embolism and thrombosis of unspecified internal jugular vein: Secondary | ICD-10-CM

## 2014-06-07 DIAGNOSIS — C50419 Malignant neoplasm of upper-outer quadrant of unspecified female breast: Secondary | ICD-10-CM

## 2014-06-07 DIAGNOSIS — D649 Anemia, unspecified: Secondary | ICD-10-CM

## 2014-06-07 DIAGNOSIS — I82B19 Acute embolism and thrombosis of unspecified subclavian vein: Secondary | ICD-10-CM

## 2014-06-07 DIAGNOSIS — Z452 Encounter for adjustment and management of vascular access device: Secondary | ICD-10-CM

## 2014-06-07 LAB — COMPREHENSIVE METABOLIC PANEL (CC13)
ALK PHOS: 63 U/L (ref 40–150)
AST: 10 U/L (ref 5–34)
Albumin: 3.1 g/dL — ABNORMAL LOW (ref 3.5–5.0)
Anion Gap: 9 mEq/L (ref 3–11)
BUN: 11.9 mg/dL (ref 7.0–26.0)
CALCIUM: 9.4 mg/dL (ref 8.4–10.4)
CO2: 25 mEq/L (ref 22–29)
CREATININE: 0.8 mg/dL (ref 0.6–1.1)
Chloride: 108 mEq/L (ref 98–109)
Glucose: 117 mg/dl (ref 70–140)
Potassium: 3.8 mEq/L (ref 3.5–5.1)
Sodium: 142 mEq/L (ref 136–145)
Total Bilirubin: 0.27 mg/dL (ref 0.20–1.20)
Total Protein: 7.4 g/dL (ref 6.4–8.3)

## 2014-06-07 LAB — CBC WITH DIFFERENTIAL/PLATELET
BASO%: 1.1 % (ref 0.0–2.0)
Basophils Absolute: 0.1 10*3/uL (ref 0.0–0.1)
EOS%: 11.7 % — AB (ref 0.0–7.0)
Eosinophils Absolute: 0.6 10*3/uL — ABNORMAL HIGH (ref 0.0–0.5)
HEMATOCRIT: 30.6 % — AB (ref 34.8–46.6)
HGB: 10 g/dL — ABNORMAL LOW (ref 11.6–15.9)
LYMPH%: 25.2 % (ref 14.0–49.7)
MCH: 31.7 pg (ref 25.1–34.0)
MCHC: 32.7 g/dL (ref 31.5–36.0)
MCV: 97 fL (ref 79.5–101.0)
MONO#: 0.4 10*3/uL (ref 0.1–0.9)
MONO%: 6.8 % (ref 0.0–14.0)
NEUT#: 3 10*3/uL (ref 1.5–6.5)
NEUT%: 55.2 % (ref 38.4–76.8)
Platelets: 494 10*3/uL — ABNORMAL HIGH (ref 145–400)
RBC: 3.15 10*6/uL — ABNORMAL LOW (ref 3.70–5.45)
RDW: 20 % — ABNORMAL HIGH (ref 11.2–14.5)
WBC: 5.4 10*3/uL (ref 3.9–10.3)
lymph#: 1.4 10*3/uL (ref 0.9–3.3)

## 2014-06-07 MED ORDER — HEPARIN SOD (PORK) LOCK FLUSH 100 UNIT/ML IV SOLN
500.0000 [IU] | Freq: Once | INTRAVENOUS | Status: AC
  Administered 2014-06-07: 250 [IU] via INTRAVENOUS
  Filled 2014-06-07: qty 5

## 2014-06-07 MED ORDER — SODIUM CHLORIDE 0.9 % IJ SOLN
10.0000 mL | INTRAMUSCULAR | Status: DC | PRN
  Administered 2014-06-07: 10 mL via INTRAVENOUS
  Filled 2014-06-07: qty 10

## 2014-06-07 NOTE — Progress Notes (Signed)
OFFICE PROGRESS NOTE   06/07/2014   Physicians :(K.Khan), T.Cornett, D.Bensimhon, C.Sanger, J.Kinard, C.Snider, O.Jegede (PCP)   INTERVAL HISTORY:  Patient is seen, alone for visit, in follow up of recent complications while receiving neoadjuvant systemic therapy for HER2 positive node positive left breast cancer. She completed IV Ancef on 06-06-14 for recent methicillin sensitive staph aureus and continues bid lovenox for right jugular and subclavian DVT. She has felt progressively better since visit last week, with no fever,no bleeding, no problems with PICC and no discomfort at site of previous PAC. She did not fill prescription for diflucan and is still symptomatic from oral candida, with limited po intake due to taste disturbance. The speech hesitancy resolved when she stopped compazine, apparently was dystonic type reaction from that drug. She is able to walk more easily at home, but is still fatigued and SOB with more exertion.  She is to see Dr Linus Salmons for ID on 06-11-14 She is to see Dr Brantley Stage on 06-21-14  PICC is in LUE.    ONCOLOGIC HISTORY Patient found a left breast mass in the upper outer quadrant, evaluated with mammogram at [facility] 01-18-14 with a 3.2 cm mass in the 2:00 position 7 cm from the nipple. There was also an 8 mm mass 8 cm from the nipple in the ipsilateral breast, as well as positive lymph nodes. 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 MRI confirmed 2.8 and 1.3 cm mass is as well as lymph node. On the right a 6 mm nodule, biopsied and negative for malignancy. PET scan 07-26-22 had hypermetabolic left breast mass consistent with known neoplasm and FDG positive left axillary lymph nodes,benign-appearing brown fat activity and muscular activity in the  neck and chest but no  findings for metastatic disease involving the  neck, chest, abdomen, pelvis or bones. Moderate FDG activity in the endometrial canal is thought likely due to secretory phase of ovulation or menses. No mass, uterine fibroids present.  CT CAP 02-19-14 had 3 cm left breast mass, enlarged left axillary lymph nodes are positive and no CT findings for metastatic disease involving the chest,  abdomen or pelvis and no evidence of osseous metastatic disease. Mildly enlarged fibroid uterus.  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 Patient was seen by cardiology and they have cleared her for chemotherapy with anti-HER-2-based regimen  #5 neoadjuvant chemotherapy with Taxotere carboplatin, Herceptin and perjeta every 3 weeks planned for a total of 6 cycles.Cycle 1 began 02-25-14. She was hospitalized with methicillin sensitive staph aureus PAC infection after cycle 4, with PAC removed. Cycle 5 was delayed for 2 weeks with that complication.  Review of systems as above, also: No HA or other neurologic symptoms. Bowels moving without diarrhea. No nausea. Bladder ok. Able to sleep. Has done well giving own lovenox injections using EMLA to abdomen. Is comfortable flushing PICC daily herself. Still drinking 4 Ensure daily, able to eat 1/2 hotdog also yesterday. Remainder of 10 point Review of Systems negative.  Objective:  Vital signs in last 24 hours:  BP 130/86  Pulse 84  Temp(Src) 98.4 F (36.9 C) (Oral)  Resp 20  Ht 5' 5"  (1.651 m)  Wt 190 lb 8 oz (86.41 kg)  BMI 31.70 kg/m2  LMP 02/19/2014 weight is stable from 06-03-14.  Alert, oriented  and appropriate. Easily mobile and fairly quickly ambulatory, without difficulty. Respirations not labored RA. Speech fluent and appropriate. Alopecia  HEENT:PERRL, sclerae not icteric, EOMI. Oral mucosa moist with candida on tongue and buccal mucosa, posterior pharynx clear.  Neck supple, no  tenderness or cord on right. No JVD.  Lymphatics:no cervical,suraclavicular, axillary adenopathy Resp: clear to auscultation bilaterally and normal percussion bilaterally. No wheezes or rales. No use of accessory muscles. Cardio: regular rate and rhythm. No gallop. Clear heart sounds. GI: soft, nontender, not distended, no mass or organomegaly. Normally active bowel sounds. Surgical incision not remarkable. Musculoskeletal/ Extremities: without pitting edema, cords, tenderness. PICC left upper arm dressed, no swelling, no tenderness. Neuro: no peripheral neuropathy. Otherwise nonfocal. PSYCH normal mood and affect Skin without rash, ecchymosis, petechiae Breasts: exam not repeated today, nothing palpable in left breast last week. Portacath site right anterior chest with clean granulation tissue, ~ 0.5 x 0.5 cm, no surrounding erythema or ecchymosis  Lab Results:  Results for orders placed in visit on 06/07/14  CBC WITH DIFFERENTIAL      Result Value Ref Range   WBC 5.4  3.9 - 10.3 10e3/uL   NEUT# 3.0  1.5 - 6.5 10e3/uL   HGB 10.0 (*) 11.6 - 15.9 g/dL   HCT 30.6 (*) 34.8 - 46.6 %   Platelets 494 (*) 145 - 400 10e3/uL   MCV 97.0  79.5 - 101.0 fL   MCH 31.7  25.1 - 34.0 pg   MCHC 32.7  31.5 - 36.0 g/dL   RBC 3.15 (*) 3.70 - 5.45 10e6/uL   RDW 20.0 (*) 11.2 - 14.5 %   lymph# 1.4  0.9 - 3.3 10e3/uL   MONO# 0.4  0.1 - 0.9 10e3/uL   Eosinophils Absolute 0.6 (*) 0.0 - 0.5 10e3/uL   Basophils Absolute 0.1  0.0 - 0.1 10e3/uL   NEUT% 55.2  38.4 - 76.8 %   LYMPH% 25.2  14.0 - 49.7 %   MONO% 6.8  0.0 - 14.0 %   EOS% 11.7 (*) 0.0 - 7.0 %   BASO% 1.1  0.0 - 2.0 %  COMPREHENSIVE METABOLIC PANEL (WL89)      Result Value Ref Range   Sodium 142  136 - 145 mEq/L   Potassium 3.8  3.5 - 5.1 mEq/L   Chloride 108  98 - 109 mEq/L   CO2 25  22 - 29 mEq/L   Glucose 117  70 - 140 mg/dl   BUN 11.9  7.0 - 26.0 mg/dL   Creatinine 0.8  0.6 - 1.1 mg/dL   Total Bilirubin 0.27  0.20 - 1.20 mg/dL   Alkaline  Phosphatase 63  40 - 150 U/L   AST 10  5 - 34 U/L   ALT <6  0 - 55 U/L   Total Protein 7.4  6.4 - 8.3 g/dL   Albumin 3.1 (*) 3.5 - 5.0 g/dL   Calcium 9.4  8.4 - 10.4 mg/dL   Anion Gap 9  3 - 11 mEq/L     Studies/Results:  No results found.  Medications: I have reviewed the patient's current medications. Compazine allergy to be listed. She had 60 lovenox syringes dispensed, enough for 30 days at present bid dosing. IV antibiotics completed 06-06-13.  DISCUSSION: she appears stable to continue chemotherapy with cycle 5 carbo taxotere this week. She will use decadron 8 mg bid x 3 days beginning day prior to taxotere, neulasta day after chemo and additional IVF on day 2 or day 3.  I discussed timing of MRI with Dr Jana Hakim, as this was previously scheduled for 06-14-14, and still may be reasonable to get then; I have sent message to Dr Brantley Stage in case he prefers change in MRI timing. Patient is glad to keep PICC in until chemo completes, likely doing flushes herself at home and dressing changes weekly at this office.  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 Cycle 5 delayed due to staph infection, will be given this week with neulasta support and additional IVF. Total 6 cycles planned, then surgery. MRI to be repeated 7-13 unless Dr Brantley Stage prefers this be after last chemo.   2.Methicillin sensitive staph aureus sepsis from [facility] infection: PAC removed 05-21-14, TEE negative. IV antibiotics completed 06-06-14. 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 With chemo and surgery, will not convert to coumadin now. 5.oral candida:  diflucan to begin today  6.diarrhea in hospital resolved, C diff negative  7.Speech hesitancy resolved, seems to have been dystonic type reaction from compazine  8.anemia: transfused 3 units PRBCs in hospital, also had IV iron at [facility] previously. Hemoglobin a little better today.  9.overdue gyn exam, with some uptake on PET possibly due to phase of ovulation, but will need gyn evaluation as soon as rest of situation allows.  10. Poor po intake: encouraged her to continue Ensure for now, and begin diflucan   Patient knows to call if problems or concerns before next scheduled visit with APP on 06-17-14. Chemo,herceptin/ perjeta,  neulasta and IVF orders confirmed with labs from today.    LIVESAY,LENNIS P, MD   06/07/2014, 4:51 PM

## 2014-06-07 NOTE — Patient Instructions (Signed)
PICC Home Guide A peripherally inserted central catheter (PICC) is a long, thin, flexible tube that is inserted into a vein in the upper arm. It is a form of intravenous (IV) access. It is considered to be a "central" line because the tip of the PICC ends in a large vein in your chest. This large vein is called the superior vena cava (SVC). The PICC tip ends in the SVC because there is a lot of blood flow in the SVC. This allows medicines and IV fluids to be quickly distributed throughout the body. The PICC is inserted using a sterile technique by a specially trained nurse or physician. After the PICC is inserted, a chest X-ray exam is done to be sure it is in the correct place.  A PICC may be placed for different reasons, such as:  To give medicines and liquid nutrition that can only be given through a central line. Examples are:  Certain antibiotic treatments.  Chemotherapy.  Total parenteral nutrition (TPN).  To take frequent blood samples.  To give IV fluids and blood products.  If there is difficulty placing a peripheral intravenous (PIV) catheter. If taken care of properly, a PICC can remain in place for several months. A PICC can also allow a person to go home from the hospital early. Medicine and PICC care can be managed at home by a family member or home health care team. WHAT PROBLEMS CAN HAPPEN WHEN I HAVE A PICC? Problems with a PICC can occasionally occur. These may include:  A blood clot (thrombus) forming in or at the tip of the PICC. This can cause the PICC to become clogged. A clot-dissolving medicine called tissue plasminogen activator (tPA) can be given through the PICC to help break up the clot.  Inflammation of the vein (phlebitis) in which the PICC is placed. Signs of inflammation may include redness, pain at the insertion site, red streaks, or being able to feel a "cord" in the vein where the PICC is located.  Infection in the PICC or at the insertion site. Signs of  infection may include fever, chills, redness, swelling, or pus drainage from the PICC insertion site.  PICC movement (malposition). The PICC tip may move from its original position due to excessive physical activity, forceful coughing, sneezing, or vomiting.  A break or cut in the PICC. It is important to not use scissors near the PICC.  Nerve or tendon irritation or injury during PICC insertion. WHAT SHOULD I KEEP IN MIND ABOUT ACTIVITIES WHEN I HAVE A PICC?  You may bend your arm and move it freely. If your PICC is near or at the bend of your elbow, avoid activity with repeated motion at the elbow.  Rest at home for the remainder of the day following PICC line insertion.  Avoid lifting heavy objects as instructed by your health care provider.  Avoid using a crutch with the arm on the same side as your PICC. You may need to use a walker. WHAT SHOULD I KNOW ABOUT MY PICC DRESSING?  Keep your PICC bandage (dressing) clean and dry to prevent infection.  Ask your health care provider when you may shower. Ask your health care provider to teach you how to wrap the PICC when you do take a shower.  Change the PICC dressing as instructed by your health care provider.  Change your PICC dressing if it becomes loose or wet. WHAT SHOULD I KNOW ABOUT PICC CARE?  Check the PICC insertion site daily for   leakage, redness, swelling, or pain.  Do not take a bath, swim, or use hot tubs when you have a PICC. Cover PICC line with clear plastic wrap and tape to keep it dry while showering.  Flush the PICC as directed by your health care provider. Let your health care provider know right away if the PICC is difficult to flush or does not flush. Do not use force to flush the PICC.  Do not use a syringe that is less than 10 mL to flush the PICC.  Never pull or tug on the PICC.  Avoid blood pressure checks on the arm with the PICC.  Keep your PICC identification card with you at all times.  Do not  take the PICC out yourself. Only a trained clinical professional should remove the PICC. SEEK IMMEDIATE MEDICAL CARE IF:  Your PICC is accidently pulled all the way out. If this happens, cover the insertion site with a bandage or gauze dressing. Do not throw the PICC away. Your health care provider will need to inspect it.  Your PICC was tugged or pulled and has partially come out. Do not  push the PICC back in.  There is any type of drainage, redness, or swelling where the PICC enters the skin.  You cannot flush the PICC, it is difficult to flush, or the PICC leaks around the insertion site when it is flushed.  You hear a "flushing" sound when the PICC is flushed.  You have pain, discomfort, or numbness in your arm, shoulder, or jaw on the same side as the PICC.  You feel your heart "racing" or skipping beats.  You notice a hole or tear in the PICC.  You develop chills or a fever. MAKE SURE YOU:   Understand these instructions.  Will watch your condition.  Will get help right away if you are not doing well or get worse. Document Released: 05/26/2003 Document Revised: 09/09/2013 Document Reviewed: 07/27/2013 [employer] Patient Information 2015 [employer]. This information is not intended to replace advice given to you by your health care provider. Make sure you discuss any questions you have with your health care provider.  

## 2014-06-08 ENCOUNTER — Telehealth: Payer: Self-pay | Admitting: *Deleted

## 2014-06-08 ENCOUNTER — Telehealth: Payer: Self-pay

## 2014-06-08 NOTE — Telephone Encounter (Signed)
Message copied by Prentiss Bells on Tue Jun 08, 2014  1:15 PM ------      Message from: Gordy Levan      Created: Tue Jun 08, 2014  7:57 AM       Please let her know that Dr Brantley Stage and I discussed timing of the breast MRI. He would like it to be done as scheduled on 7-13, before he sees her.      Please be sure patient is told chemo schedule for this week, POF and msg to [facility] done late yesterday. I have put in orders for Rx and for IVF 1-2 days later, but dates may need to move depending on real schedule.      Thanks      Cc LA, TH, Nikia Mangino ------

## 2014-06-08 NOTE — Telephone Encounter (Signed)
Per staff message and POF I have scheduled appts. I have called that spoke with the patient. JMW

## 2014-06-08 NOTE — Telephone Encounter (Signed)
Confirmed with patient that she has lab at 1 and chemo at 145 on 7/8.  Let pt know per Dr. Marko Plume that MRI on 7/13 was discussed with Dr. Brantley Stage and they agree it needs to be done before appt with Cornett.  Patient voiced understanding.

## 2014-06-08 NOTE — Telephone Encounter (Signed)
Patient will pick up schedule this week

## 2014-06-08 NOTE — Telephone Encounter (Signed)
No answer.  Mailbox full -cannot take messages

## 2014-06-09 ENCOUNTER — Ambulatory Visit: Payer: Medicaid Other

## 2014-06-09 ENCOUNTER — Ambulatory Visit (HOSPITAL_BASED_OUTPATIENT_CLINIC_OR_DEPARTMENT_OTHER): Payer: Medicaid Other

## 2014-06-09 ENCOUNTER — Other Ambulatory Visit (HOSPITAL_BASED_OUTPATIENT_CLINIC_OR_DEPARTMENT_OTHER): Payer: Medicaid Other

## 2014-06-09 VITALS — BP 149/82 | HR 90 | Temp 98.1°F | Resp 20

## 2014-06-09 DIAGNOSIS — C50412 Malignant neoplasm of upper-outer quadrant of left female breast: Secondary | ICD-10-CM

## 2014-06-09 DIAGNOSIS — Z452 Encounter for adjustment and management of vascular access device: Secondary | ICD-10-CM

## 2014-06-09 DIAGNOSIS — Z5111 Encounter for antineoplastic chemotherapy: Secondary | ICD-10-CM

## 2014-06-09 DIAGNOSIS — Z5112 Encounter for antineoplastic immunotherapy: Secondary | ICD-10-CM

## 2014-06-09 DIAGNOSIS — C50419 Malignant neoplasm of upper-outer quadrant of unspecified female breast: Secondary | ICD-10-CM

## 2014-06-09 LAB — COMPREHENSIVE METABOLIC PANEL (CC13)
ALBUMIN: 3.5 g/dL (ref 3.5–5.0)
ALT: 7 U/L (ref 0–55)
AST: 10 U/L (ref 5–34)
Alkaline Phosphatase: 70 U/L (ref 40–150)
Anion Gap: 11 mEq/L (ref 3–11)
BUN: 15 mg/dL (ref 7.0–26.0)
CALCIUM: 10.3 mg/dL (ref 8.4–10.4)
CO2: 23 mEq/L (ref 22–29)
CREATININE: 0.9 mg/dL (ref 0.6–1.1)
Chloride: 106 mEq/L (ref 98–109)
GLUCOSE: 140 mg/dL (ref 70–140)
POTASSIUM: 3.9 meq/L (ref 3.5–5.1)
Sodium: 141 mEq/L (ref 136–145)
Total Bilirubin: 0.34 mg/dL (ref 0.20–1.20)
Total Protein: 8.3 g/dL (ref 6.4–8.3)

## 2014-06-09 LAB — CBC WITH DIFFERENTIAL/PLATELET
BASO%: 0.8 % (ref 0.0–2.0)
BASOS ABS: 0.1 10*3/uL (ref 0.0–0.1)
EOS ABS: 0 10*3/uL (ref 0.0–0.5)
EOS%: 0.1 % (ref 0.0–7.0)
HCT: 33.4 % — ABNORMAL LOW (ref 34.8–46.6)
HEMOGLOBIN: 11.2 g/dL — AB (ref 11.6–15.9)
LYMPH%: 12.3 % — ABNORMAL LOW (ref 14.0–49.7)
MCH: 32.2 pg (ref 25.1–34.0)
MCHC: 33.5 g/dL (ref 31.5–36.0)
MCV: 96.2 fL (ref 79.5–101.0)
MONO#: 0.4 10*3/uL (ref 0.1–0.9)
MONO%: 6.7 % (ref 0.0–14.0)
NEUT#: 5.3 10*3/uL (ref 1.5–6.5)
NEUT%: 80.1 % — AB (ref 38.4–76.8)
PLATELETS: 660 10*3/uL — AB (ref 145–400)
RBC: 3.47 10*6/uL — ABNORMAL LOW (ref 3.70–5.45)
RDW: 19.8 % — AB (ref 11.2–14.5)
WBC: 6.7 10*3/uL (ref 3.9–10.3)
lymph#: 0.8 10*3/uL — ABNORMAL LOW (ref 0.9–3.3)

## 2014-06-09 MED ORDER — SODIUM CHLORIDE 0.9 % IJ SOLN
10.0000 mL | INTRAMUSCULAR | Status: DC | PRN
  Administered 2014-06-09: 10 mL
  Filled 2014-06-09: qty 10

## 2014-06-09 MED ORDER — SODIUM CHLORIDE 0.9 % IV SOLN
420.0000 mg | Freq: Once | INTRAVENOUS | Status: AC
  Administered 2014-06-09: 420 mg via INTRAVENOUS
  Filled 2014-06-09: qty 14

## 2014-06-09 MED ORDER — DIPHENHYDRAMINE HCL 25 MG PO CAPS
ORAL_CAPSULE | ORAL | Status: AC
  Filled 2014-06-09: qty 2

## 2014-06-09 MED ORDER — SODIUM CHLORIDE 0.9 % IV SOLN
150.0000 mg | Freq: Once | INTRAVENOUS | Status: AC
  Administered 2014-06-09: 150 mg via INTRAVENOUS
  Filled 2014-06-09: qty 5

## 2014-06-09 MED ORDER — ACETAMINOPHEN 325 MG PO TABS
ORAL_TABLET | ORAL | Status: AC
  Filled 2014-06-09: qty 2

## 2014-06-09 MED ORDER — PALONOSETRON HCL INJECTION 0.25 MG/5ML
0.2500 mg | Freq: Once | INTRAVENOUS | Status: AC
  Administered 2014-06-09: 0.25 mg via INTRAVENOUS

## 2014-06-09 MED ORDER — DOCETAXEL CHEMO INJECTION 160 MG/16ML
75.0000 mg/m2 | Freq: Once | INTRAVENOUS | Status: AC
  Administered 2014-06-09: 150 mg via INTRAVENOUS
  Filled 2014-06-09: qty 15

## 2014-06-09 MED ORDER — DEXAMETHASONE SODIUM PHOSPHATE 20 MG/5ML IJ SOLN
INTRAMUSCULAR | Status: AC
  Filled 2014-06-09: qty 5

## 2014-06-09 MED ORDER — SODIUM CHLORIDE 0.9 % IV SOLN
Freq: Once | INTRAVENOUS | Status: AC
  Administered 2014-06-09: 14:00:00 via INTRAVENOUS

## 2014-06-09 MED ORDER — HEPARIN SOD (PORK) LOCK FLUSH 100 UNIT/ML IV SOLN
500.0000 [IU] | Freq: Once | INTRAVENOUS | Status: AC | PRN
  Administered 2014-06-09: 500 [IU]
  Filled 2014-06-09: qty 5

## 2014-06-09 MED ORDER — SODIUM CHLORIDE 0.9 % IJ SOLN
10.0000 mL | INTRAMUSCULAR | Status: DC | PRN
  Administered 2014-06-09: 10 mL via INTRAVENOUS
  Filled 2014-06-09: qty 10

## 2014-06-09 MED ORDER — DIPHENHYDRAMINE HCL 25 MG PO CAPS
50.0000 mg | ORAL_CAPSULE | Freq: Once | ORAL | Status: AC
  Administered 2014-06-09: 50 mg via ORAL

## 2014-06-09 MED ORDER — ACETAMINOPHEN 325 MG PO TABS
650.0000 mg | ORAL_TABLET | Freq: Once | ORAL | Status: AC
  Administered 2014-06-09: 650 mg via ORAL

## 2014-06-09 MED ORDER — TRASTUZUMAB CHEMO INJECTION 440 MG
6.0000 mg/kg | Freq: Once | INTRAVENOUS | Status: AC
  Administered 2014-06-09: 525 mg via INTRAVENOUS
  Filled 2014-06-09: qty 25

## 2014-06-09 MED ORDER — PALONOSETRON HCL INJECTION 0.25 MG/5ML
INTRAVENOUS | Status: AC
  Filled 2014-06-09: qty 5

## 2014-06-09 MED ORDER — SODIUM CHLORIDE 0.9 % IV SOLN
840.0000 mg | Freq: Once | INTRAVENOUS | Status: AC
  Administered 2014-06-09: 840 mg via INTRAVENOUS
  Filled 2014-06-09: qty 84

## 2014-06-09 MED ORDER — DEXAMETHASONE SODIUM PHOSPHATE 20 MG/5ML IJ SOLN
12.0000 mg | Freq: Once | INTRAMUSCULAR | Status: AC
  Administered 2014-06-09: 12 mg via INTRAVENOUS

## 2014-06-09 NOTE — Patient Instructions (Signed)
[facility] Discharge Instructions for Patients Receiving Chemotherapy  Today you received the following chemotherapy agents Herceptin/Perjeta/Taxotere/Carboplatin  To help prevent nausea and vomiting after your treatment, we encourage you to take your nausea medication    If you develop nausea and vomiting that is not controlled by your nausea medication, call the clinic.   BELOW ARE SYMPTOMS THAT SHOULD BE REPORTED IMMEDIATELY:  *FEVER GREATER THAN 100.5 F  *CHILLS WITH OR WITHOUT FEVER  NAUSEA AND VOMITING THAT IS NOT CONTROLLED WITH YOUR NAUSEA MEDICATION  *UNUSUAL SHORTNESS OF BREATH  *UNUSUAL BRUISING OR BLEEDING  TENDERNESS IN MOUTH AND THROAT WITH OR WITHOUT PRESENCE OF ULCERS  *URINARY PROBLEMS  *BOWEL PROBLEMS  UNUSUAL RASH Items with * indicate a potential emergency and should be followed up as soon as possible.  Feel free to call the clinic you have any questions or concerns. The clinic phone number is [phone].    

## 2014-06-09 NOTE — Patient Instructions (Signed)
PICC Home Guide A peripherally inserted central catheter (PICC) is a long, thin, flexible tube that is inserted into a vein in the upper arm. It is a form of intravenous (IV) access. It is considered to be a "central" line because the tip of the PICC ends in a large vein in your chest. This large vein is called the superior vena cava (SVC). The PICC tip ends in the SVC because there is a lot of blood flow in the SVC. This allows medicines and IV fluids to be quickly distributed throughout the body. The PICC is inserted using a sterile technique by a specially trained nurse or physician. After the PICC is inserted, a chest X-ray exam is done to be sure it is in the correct place.  A PICC may be placed for different reasons, such as:  To give medicines and liquid nutrition that can only be given through a central line. Examples are:  Certain antibiotic treatments.  Chemotherapy.  Total parenteral nutrition (TPN).  To take frequent blood samples.  To give IV fluids and blood products.  If there is difficulty placing a peripheral intravenous (PIV) catheter. If taken care of properly, a PICC can remain in place for several months. A PICC can also allow a person to go home from the hospital early. Medicine and PICC care can be managed at home by a family member or home health care team. WHAT PROBLEMS CAN HAPPEN WHEN I HAVE A PICC? Problems with a PICC can occasionally occur. These may include:  A blood clot (thrombus) forming in or at the tip of the PICC. This can cause the PICC to become clogged. A clot-dissolving medicine called tissue plasminogen activator (tPA) can be given through the PICC to help break up the clot.  Inflammation of the vein (phlebitis) in which the PICC is placed. Signs of inflammation may include redness, pain at the insertion site, red streaks, or being able to feel a "cord" in the vein where the PICC is located.  Infection in the PICC or at the insertion site. Signs of  infection may include fever, chills, redness, swelling, or pus drainage from the PICC insertion site.  PICC movement (malposition). The PICC tip may move from its original position due to excessive physical activity, forceful coughing, sneezing, or vomiting.  A break or cut in the PICC. It is important to not use scissors near the PICC.  Nerve or tendon irritation or injury during PICC insertion. WHAT SHOULD I KEEP IN MIND ABOUT ACTIVITIES WHEN I HAVE A PICC?  You may bend your arm and move it freely. If your PICC is near or at the bend of your elbow, avoid activity with repeated motion at the elbow.  Rest at home for the remainder of the day following PICC line insertion.  Avoid lifting heavy objects as instructed by your health care provider.  Avoid using a crutch with the arm on the same side as your PICC. You may need to use a walker. WHAT SHOULD I KNOW ABOUT MY PICC DRESSING?  Keep your PICC bandage (dressing) clean and dry to prevent infection.  Ask your health care provider when you may shower. Ask your health care provider to teach you how to wrap the PICC when you do take a shower.  Change the PICC dressing as instructed by your health care provider.  Change your PICC dressing if it becomes loose or wet. WHAT SHOULD I KNOW ABOUT PICC CARE?  Check the PICC insertion site daily for   leakage, redness, swelling, or pain.  Do not take a bath, swim, or use hot tubs when you have a PICC. Cover PICC line with clear plastic wrap and tape to keep it dry while showering.  Flush the PICC as directed by your health care provider. Let your health care provider know right away if the PICC is difficult to flush or does not flush. Do not use force to flush the PICC.  Do not use a syringe that is less than 10 mL to flush the PICC.  Never pull or tug on the PICC.  Avoid blood pressure checks on the arm with the PICC.  Keep your PICC identification card with you at all times.  Do not  take the PICC out yourself. Only a trained clinical professional should remove the PICC. SEEK IMMEDIATE MEDICAL CARE IF:  Your PICC is accidently pulled all the way out. If this happens, cover the insertion site with a bandage or gauze dressing. Do not throw the PICC away. Your health care provider will need to inspect it.  Your PICC was tugged or pulled and has partially come out. Do not  push the PICC back in.  There is any type of drainage, redness, or swelling where the PICC enters the skin.  You cannot flush the PICC, it is difficult to flush, or the PICC leaks around the insertion site when it is flushed.  You hear a "flushing" sound when the PICC is flushed.  You have pain, discomfort, or numbness in your arm, shoulder, or jaw on the same side as the PICC.  You feel your heart "racing" or skipping beats.  You notice a hole or tear in the PICC.  You develop chills or a fever. MAKE SURE YOU:   Understand these instructions.  Will watch your condition.  Will get help right away if you are not doing well or get worse. Document Released: 05/26/2003 Document Revised: 09/09/2013 Document Reviewed: 07/27/2013 [employer] Patient Information 2015 [employer]. This information is not intended to replace advice given to you by your health care provider. Make sure you discuss any questions you have with your health care provider.  

## 2014-06-10 ENCOUNTER — Ambulatory Visit (HOSPITAL_BASED_OUTPATIENT_CLINIC_OR_DEPARTMENT_OTHER): Payer: Medicaid Other

## 2014-06-10 ENCOUNTER — Ambulatory Visit: Payer: Medicaid Other

## 2014-06-10 ENCOUNTER — Other Ambulatory Visit: Payer: Medicaid Other

## 2014-06-10 ENCOUNTER — Ambulatory Visit: Payer: Medicaid Other | Admitting: Adult Health

## 2014-06-10 DIAGNOSIS — A4101 Sepsis due to Methicillin susceptible Staphylococcus aureus: Secondary | ICD-10-CM

## 2014-06-10 DIAGNOSIS — C50419 Malignant neoplasm of upper-outer quadrant of unspecified female breast: Secondary | ICD-10-CM

## 2014-06-10 DIAGNOSIS — C50412 Malignant neoplasm of upper-outer quadrant of left female breast: Secondary | ICD-10-CM

## 2014-06-10 DIAGNOSIS — Z5189 Encounter for other specified aftercare: Secondary | ICD-10-CM

## 2014-06-10 MED ORDER — PROMETHAZINE HCL 25 MG/ML IJ SOLN
12.5000 mg | Freq: Once | INTRAMUSCULAR | Status: AC | PRN
  Administered 2014-06-10: 12.5 mg via INTRAVENOUS
  Filled 2014-06-10: qty 1

## 2014-06-10 MED ORDER — SODIUM CHLORIDE 0.9 % IV SOLN
1000.0000 mL | INTRAVENOUS | Status: DC
  Administered 2014-06-10: 14:00:00 via INTRAVENOUS

## 2014-06-10 MED ORDER — LORAZEPAM 2 MG/ML IJ SOLN
1.0000 mg | INTRAMUSCULAR | Status: DC | PRN
  Administered 2014-06-10: 1 mg via INTRAVENOUS

## 2014-06-10 MED ORDER — PEGFILGRASTIM INJECTION 6 MG/0.6ML
6.0000 mg | Freq: Once | SUBCUTANEOUS | Status: AC
  Administered 2014-06-10: 6 mg via SUBCUTANEOUS
  Filled 2014-06-10: qty 0.6

## 2014-06-10 MED ORDER — LORAZEPAM 2 MG/ML IJ SOLN
INTRAMUSCULAR | Status: AC
  Filled 2014-06-10: qty 1

## 2014-06-10 NOTE — Patient Instructions (Signed)
Dehydration, Adult Dehydration is when you lose more fluids from the body than you take in. Vital organs like the kidneys, brain, and heart cannot function without a proper amount of fluids and salt. Any loss of fluids from the body can cause dehydration.  CAUSES   Vomiting.  Diarrhea.  Excessive sweating.  Excessive urine output.  Fever. SYMPTOMS  Mild dehydration  Thirst.  Dry lips.  Slightly dry mouth. Moderate dehydration  Very dry mouth.  Sunken eyes.  Skin does not bounce back quickly when lightly pinched and released.  Dark urine and decreased urine production.  Decreased tear production.  Headache. Severe dehydration  Very dry mouth.  Extreme thirst.  Rapid, weak pulse (more than 100 beats per minute at rest).  Cold hands and feet.  Not able to sweat in spite of heat and temperature.  Rapid breathing.  Blue lips.  Confusion and lethargy.  Difficulty being awakened.  Minimal urine production.  No tears. DIAGNOSIS  Your caregiver will diagnose dehydration based on your symptoms and your exam. Blood and urine tests will help confirm the diagnosis. The diagnostic evaluation should also identify the cause of dehydration. TREATMENT  Treatment of mild or moderate dehydration can often be done at home by increasing the amount of fluids that you drink. It is best to drink small amounts of fluid more often. Drinking too much at one time can make vomiting worse. Refer to the home care instructions below. Severe dehydration needs to be treated at the hospital where you will probably be given intravenous (IV) fluids that contain water and electrolytes. HOME CARE INSTRUCTIONS   Ask your caregiver about specific rehydration instructions.  Drink enough fluids to keep your urine clear or pale yellow.  Drink small amounts frequently if you have nausea and vomiting.  Eat as you normally do.  Avoid:  Foods or drinks high in sugar.  Carbonated  drinks.  Juice.  Extremely hot or cold fluids.  Drinks with caffeine.  Fatty, greasy foods.  Alcohol.  Tobacco.  Overeating.  Gelatin desserts.  Wash your hands well to avoid spreading bacteria and viruses.  Only take over-the-counter or prescription medicines for pain, discomfort, or fever as directed by your caregiver.  Ask your caregiver if you should continue all prescribed and over-the-counter medicines.  Keep all follow-up appointments with your caregiver. SEEK MEDICAL CARE IF:  You have abdominal pain and it increases or stays in one area (localizes).  You have a rash, stiff neck, or severe headache.  You are irritable, sleepy, or difficult to awaken.  You are weak, dizzy, or extremely thirsty. SEEK IMMEDIATE MEDICAL CARE IF:   You are unable to keep fluids down or you get worse despite treatment.  You have frequent episodes of vomiting or diarrhea.  You have blood or green matter (bile) in your vomit.  You have blood in your stool or your stool looks black and tarry.  You have not urinated in 6 to 8 hours, or you have only urinated a small amount of very dark urine.  You have a fever.  You faint. MAKE SURE YOU:   Understand these instructions.  Will watch your condition.  Will get help right away if you are not doing well or get worse. Document Released: 11/19/2005 Document Revised: 02/11/2012 Document Reviewed: 07/09/2011 ExitCare Patient Information 2015 [employer]. This information is not intended to replace advice given to you by your health care provider. Make sure you discuss any questions you have with your health care   provider.  

## 2014-06-11 ENCOUNTER — Ambulatory Visit: Payer: Medicaid Other | Admitting: Oncology

## 2014-06-11 ENCOUNTER — Ambulatory Visit: Payer: Medicaid Other

## 2014-06-11 ENCOUNTER — Encounter: Payer: Self-pay | Admitting: Internal Medicine

## 2014-06-11 ENCOUNTER — Other Ambulatory Visit: Payer: Medicaid Other

## 2014-06-11 ENCOUNTER — Ambulatory Visit (INDEPENDENT_AMBULATORY_CARE_PROVIDER_SITE_OTHER): Payer: Medicaid Other | Admitting: Internal Medicine

## 2014-06-11 VITALS — BP 184/130 | HR 73 | Temp 98.5°F | Wt 190.0 lb

## 2014-06-11 DIAGNOSIS — A419 Sepsis, unspecified organism: Secondary | ICD-10-CM

## 2014-06-11 DIAGNOSIS — A4101 Sepsis due to Methicillin susceptible Staphylococcus aureus: Secondary | ICD-10-CM

## 2014-06-11 NOTE — Assessment & Plan Note (Signed)
Doing great.  Finished treatment and has picc, resuming chemo with picc.  Wound looks good too so no further antibiotics indicated.   RTC prn

## 2014-06-11 NOTE — Progress Notes (Signed)
   Subjective:    Patient ID: Tamara [name], female    DOB: [birth year], 43 y.o.   MRN: [id number]  HPI Here for hospital follow up.  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  Course complicated by MSSA bacteremia and had port a cath removed.  TEE negative for vegetation and had 2 weeks of IV nafcillin/Ancef and completed 7/5.  Also a mild wound infection over PAC site.  No fever, no chills, no issues with wound.     Review of Systems  Constitutional: Negative for fever and chills.  Gastrointestinal: Negative for nausea and diarrhea.  Skin: Positive for wound. Negative for rash.  Hematological: Negative for adenopathy.       Objective:   Physical Exam  Constitutional: She appears well-developed and well-nourished. No distress.  HENT:  Mouth/Throat: No oropharyngeal exudate.  Eyes: No scleral icterus.  Skin:  picc left arm with no erythema Wound clean and good granulation tissue, open about 1/2 cm, no surrounding erythema          Assessment & Plan:

## 2014-06-14 ENCOUNTER — Ambulatory Visit (HOSPITAL_COMMUNITY)
Admission: RE | Admit: 2014-06-14 | Discharge: 2014-06-14 | Disposition: A | Discharge status: Home / Self Care | Payer: Medicaid Other | Source: Ambulatory Visit | Attending: Adult Health | Admitting: Adult Health

## 2014-06-14 ENCOUNTER — Other Ambulatory Visit: Payer: Self-pay | Admitting: Adult Health

## 2014-06-14 ENCOUNTER — Telehealth: Payer: Self-pay

## 2014-06-14 ENCOUNTER — Inpatient Hospital Stay (HOSPITAL_COMMUNITY)
Admission: RE | Admit: 2014-06-14 | Discharge: 2014-06-14 | Disposition: A | Discharge status: Home / Self Care | Payer: Medicaid Other | Source: Ambulatory Visit | Attending: Adult Health | Admitting: Adult Health

## 2014-06-14 DIAGNOSIS — C50412 Malignant neoplasm of upper-outer quadrant of left female breast: Secondary | ICD-10-CM

## 2014-06-14 DIAGNOSIS — R110 Nausea: Secondary | ICD-10-CM

## 2014-06-14 MED ORDER — GADOBENATE DIMEGLUMINE 529 MG/ML IV SOLN
18.0000 mL | Freq: Once | INTRAVENOUS | Status: AC | PRN
  Administered 2014-06-14: 18 mL via INTRAVENOUS

## 2014-06-14 MED ORDER — LORAZEPAM 0.5 MG PO TABS: 0.5000 mg | ORAL_TABLET | Freq: Four times a day (QID) | ORAL | Status: DC | PRN

## 2014-06-14 MED ORDER — PROMETHAZINE HCL 25 MG PO TABS: 25.0000 mg | ORAL_TABLET | Freq: Four times a day (QID) | ORAL | Status: DC | PRN

## 2014-06-14 NOTE — Telephone Encounter (Signed)
Returned pt c/o nausea and vomiting.  Unable to complete MRI this am - has been rescheduled for next wed 7/15.  Ativan called in to pharmacy.  Phenergan transmitted to pharmacy.  Advised pt to taek ativan prior to MRI to help complete the procedure.   Pt voiced understanding.

## 2014-06-17 ENCOUNTER — Other Ambulatory Visit (HOSPITAL_BASED_OUTPATIENT_CLINIC_OR_DEPARTMENT_OTHER): Payer: Medicaid Other

## 2014-06-17 ENCOUNTER — Ambulatory Visit (HOSPITAL_BASED_OUTPATIENT_CLINIC_OR_DEPARTMENT_OTHER): Payer: Medicaid Other | Admitting: Adult Health

## 2014-06-17 ENCOUNTER — Ambulatory Visit (HOSPITAL_BASED_OUTPATIENT_CLINIC_OR_DEPARTMENT_OTHER): Payer: Medicaid Other

## 2014-06-17 ENCOUNTER — Ambulatory Visit: Payer: Medicaid Other

## 2014-06-17 VITALS — BP 130/85 | HR 93 | Temp 98.4°F | Resp 20 | Ht 65.0 in | Wt 190.4 lb

## 2014-06-17 DIAGNOSIS — Z1509 Genetic susceptibility to other malignant neoplasm: Principal | ICD-10-CM

## 2014-06-17 DIAGNOSIS — Z171 Estrogen receptor negative status [ER-]: Secondary | ICD-10-CM

## 2014-06-17 DIAGNOSIS — C50419 Malignant neoplasm of upper-outer quadrant of unspecified female breast: Secondary | ICD-10-CM

## 2014-06-17 DIAGNOSIS — C50412 Malignant neoplasm of upper-outer quadrant of left female breast: Secondary | ICD-10-CM

## 2014-06-17 DIAGNOSIS — Z1501 Genetic susceptibility to malignant neoplasm of breast: Secondary | ICD-10-CM

## 2014-06-17 DIAGNOSIS — Z452 Encounter for adjustment and management of vascular access device: Secondary | ICD-10-CM

## 2014-06-17 DIAGNOSIS — I82409 Acute embolism and thrombosis of unspecified deep veins of unspecified lower extremity: Secondary | ICD-10-CM

## 2014-06-17 LAB — CBC WITH DIFFERENTIAL/PLATELET
BASO%: 0.4 % (ref 0.0–2.0)
Basophils Absolute: 0.1 10*3/uL (ref 0.0–0.1)
EOS%: 0.2 % (ref 0.0–7.0)
Eosinophils Absolute: 0 10*3/uL (ref 0.0–0.5)
HCT: 32.6 % — ABNORMAL LOW (ref 34.8–46.6)
HGB: 10.3 g/dL — ABNORMAL LOW (ref 11.6–15.9)
LYMPH#: 4 10*3/uL — AB (ref 0.9–3.3)
LYMPH%: 23.8 % (ref 14.0–49.7)
MCH: 31.7 pg (ref 25.1–34.0)
MCHC: 31.6 g/dL (ref 31.5–36.0)
MCV: 100.3 fL (ref 79.5–101.0)
MONO#: 1.7 10*3/uL — ABNORMAL HIGH (ref 0.1–0.9)
MONO%: 10.1 % (ref 0.0–14.0)
NEUT#: 11.1 10*3/uL — ABNORMAL HIGH (ref 1.5–6.5)
NEUT%: 65.5 % (ref 38.4–76.8)
Platelets: 413 10*3/uL — ABNORMAL HIGH (ref 145–400)
RBC: 3.25 10*6/uL — ABNORMAL LOW (ref 3.70–5.45)
RDW: 16.6 % — ABNORMAL HIGH (ref 11.2–14.5)
WBC: 17 10*3/uL — ABNORMAL HIGH (ref 3.9–10.3)

## 2014-06-17 LAB — COMPREHENSIVE METABOLIC PANEL (CC13)
ALBUMIN: 3.5 g/dL (ref 3.5–5.0)
ALT: 23 U/L (ref 0–55)
ANION GAP: 8 meq/L (ref 3–11)
AST: 16 U/L (ref 5–34)
Alkaline Phosphatase: 81 U/L (ref 40–150)
BUN: 13.4 mg/dL (ref 7.0–26.0)
CHLORIDE: 107 meq/L (ref 98–109)
CO2: 25 mEq/L (ref 22–29)
Calcium: 9.3 mg/dL (ref 8.4–10.4)
Creatinine: 0.9 mg/dL (ref 0.6–1.1)
GLUCOSE: 115 mg/dL (ref 70–140)
Potassium: 3.6 mEq/L (ref 3.5–5.1)
Sodium: 140 mEq/L (ref 136–145)
TOTAL PROTEIN: 6.8 g/dL (ref 6.4–8.3)

## 2014-06-17 MED ORDER — SODIUM CHLORIDE 0.9 % IJ SOLN
10.0000 mL | INTRAMUSCULAR | Status: DC | PRN
  Administered 2014-06-17: 10 mL via INTRAVENOUS
  Filled 2014-06-17: qty 10

## 2014-06-17 MED ORDER — HEPARIN SOD (PORK) LOCK FLUSH 100 UNIT/ML IV SOLN
500.0000 [IU] | Freq: Once | INTRAVENOUS | Status: AC
  Administered 2014-06-17: 250 [IU] via INTRAVENOUS
  Filled 2014-06-17: qty 5

## 2014-06-17 NOTE — Patient Instructions (Signed)
PICC Home Guide A peripherally inserted central catheter (PICC) is a long, thin, flexible tube that is inserted into a vein in the upper arm. It is a form of intravenous (IV) access. It is considered to be a "central" line because the tip of the PICC ends in a large vein in your chest. This large vein is called the superior vena cava (SVC). The PICC tip ends in the SVC because there is a lot of blood flow in the SVC. This allows medicines and IV fluids to be quickly distributed throughout the body. The PICC is inserted using a sterile technique by a specially trained nurse or physician. After the PICC is inserted, a chest X-ray exam is done to be sure it is in the correct place.  A PICC may be placed for different reasons, such as:  To give medicines and liquid nutrition that can only be given through a central line. Examples are:  Certain antibiotic treatments.  Chemotherapy.  Total parenteral nutrition (TPN).  To take frequent blood samples.  To give IV fluids and blood products.  If there is difficulty placing a peripheral intravenous (PIV) catheter. If taken care of properly, a PICC can remain in place for several months. A PICC can also allow a person to go home from the hospital early. Medicine and PICC care can be managed at home by a family member or home health care team. WHAT PROBLEMS CAN HAPPEN WHEN I HAVE A PICC? Problems with a PICC can occasionally occur. These may include:  A blood clot (thrombus) forming in or at the tip of the PICC. This can cause the PICC to become clogged. A clot-dissolving medicine called tissue plasminogen activator (tPA) can be given through the PICC to help break up the clot.  Inflammation of the vein (phlebitis) in which the PICC is placed. Signs of inflammation may include redness, pain at the insertion site, red streaks, or being able to feel a "cord" in the vein where the PICC is located.  Infection in the PICC or at the insertion site. Signs of  infection may include fever, chills, redness, swelling, or pus drainage from the PICC insertion site.  PICC movement (malposition). The PICC tip may move from its original position due to excessive physical activity, forceful coughing, sneezing, or vomiting.  A break or cut in the PICC. It is important to not use scissors near the PICC.  Nerve or tendon irritation or injury during PICC insertion. WHAT SHOULD I KEEP IN MIND ABOUT ACTIVITIES WHEN I HAVE A PICC?  You may bend your arm and move it freely. If your PICC is near or at the bend of your elbow, avoid activity with repeated motion at the elbow.  Rest at home for the remainder of the day following PICC line insertion.  Avoid lifting heavy objects as instructed by your health care provider.  Avoid using a crutch with the arm on the same side as your PICC. You may need to use a walker. WHAT SHOULD I KNOW ABOUT MY PICC DRESSING?  Keep your PICC bandage (dressing) clean and dry to prevent infection.  Ask your health care provider when you may shower. Ask your health care provider to teach you how to wrap the PICC when you do take a shower.  Change the PICC dressing as instructed by your health care provider.  Change your PICC dressing if it becomes loose or wet. WHAT SHOULD I KNOW ABOUT PICC CARE?  Check the PICC insertion site daily for   leakage, redness, swelling, or pain.  Do not take a bath, swim, or use hot tubs when you have a PICC. Cover PICC line with clear plastic wrap and tape to keep it dry while showering.  Flush the PICC as directed by your health care provider. Let your health care provider know right away if the PICC is difficult to flush or does not flush. Do not use force to flush the PICC.  Do not use a syringe that is less than 10 mL to flush the PICC.  Never pull or tug on the PICC.  Avoid blood pressure checks on the arm with the PICC.  Keep your PICC identification card with you at all times.  Do not  take the PICC out yourself. Only a trained clinical professional should remove the PICC. SEEK IMMEDIATE MEDICAL CARE IF:  Your PICC is accidently pulled all the way out. If this happens, cover the insertion site with a bandage or gauze dressing. Do not throw the PICC away. Your health care provider will need to inspect it.  Your PICC was tugged or pulled and has partially come out. Do not  push the PICC back in.  There is any type of drainage, redness, or swelling where the PICC enters the skin.  You cannot flush the PICC, it is difficult to flush, or the PICC leaks around the insertion site when it is flushed.  You hear a "flushing" sound when the PICC is flushed.  You have pain, discomfort, or numbness in your arm, shoulder, or jaw on the same side as the PICC.  You feel your heart "racing" or skipping beats.  You notice a hole or tear in the PICC.  You develop chills or a fever. MAKE SURE YOU:   Understand these instructions.  Will watch your condition.  Will get help right away if you are not doing well or get worse. Document Released: 05/26/2003 Document Revised: 09/09/2013 Document Reviewed: 07/27/2013 [employer] Patient Information 2015 [employer]. This information is not intended to replace advice given to you by your health care provider. Make sure you discuss any questions you have with your health care provider.  

## 2014-06-17 NOTE — Progress Notes (Signed)
ID: [name] OB: January 30, 1971  MR#: [id number]  CSN#:[id number]  PCP: Tamara Chessman, MD GYN:   SU: Dr. Brantley [name] OTHER MD: Dr. Bensimhon-cardiology, Tamara [name] surgery, Tamara [name] oncology, Tamara [name]-infectious disease  CHIEF COMPLAINT:  Patient with HER-2/neu left breast cancer receiving neoadjuvant chemotherapy.  BREAST CANCER HISTORY:   Patient found a left breast mass in the upper outer quadrant, evaluated with mammogram at [facility] 01-18-14 with a 3.2 cm mass in the 2:00 position 7 cm from the nipple. There was also an 8 mm mass 8 cm from the nipple in the ipsilateral breast, as well as positive lymph nodes. 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 MRI confirmed 2.8 and 1.3 cm mass [name] as well as lymph node. On the right a 6 mm nodule, biopsied and negative for malignancy. PET scan 8-91-69 had hypermetabolic left breast mass consistent with known neoplasm and FDG positive left axillary lymph nodes,benign-appearing brown fat activity and muscular activity in the  neck and chest but no findings for metastatic disease involving the  neck, chest, abdomen, pelvis or bones. Moderate FDG activity in the endometrial canal [name] thought likely due to secretory phase of ovulation or menses. No mass, uterine fibroids present.  CT CAP 02-19-14 had 3 cm left breast mass, enlarged left axillary lymph nodes are positive and no CT findings for metastatic disease involving the chest,  abdomen or pelvis and no evidence of osseous metastatic disease. Mildly enlarged fibroid uterus.    CURRENT THERAPY: neoadjuvant chemotherapy with Taxotere carboplatin, Herceptin and perjeta every 3 weeks planned for a total of 6 cycles.  Cycle 5 day 1.     INTERVAL HISTORY:  Tamara [name] returning for  follow up prior to receiving treatment.  She has completed her antibiotic therapy due to her MSSA infection.  She denies fevers, chills, nausea, vomiting, constipation, diarrhea, numbness/tingling, nail changes or any further concerns.  She would like to change to a female Psychologist, sport and exercise.  Otherwise, a 10 point ROS [name] neg.    REVIEW OF SYSTEMS: A 10 point review of systems was conducted and [name] otherwise negative except for what [name] noted above.    PAST MEDICAL HISTORY: Past Medical History  Diagnosis Date  . Endometriosis   . Hypertension   . Rash     fine rash on abd  . Anemia   . Wears glasses   . 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 Iron deficiency anemia, unspecified 02/25/2014  . BRCA1 positive     c.190T>G (p.Cys64Gly) @ [facility]     PAST SURGICAL HISTORY: Past Surgical History  Procedure Laterality Date  . Cervical polypectomy  2010  . Cesarean section      one previous  . Tubal ligation    . Portacath placement Right 02/23/2014    Procedure: INSERTION PORT-A-CATH;  Surgeon: Tamara Faster. Cornett, MD;  Location: [facility];  Service: General;  Laterality: Right;  . Port-a-cath removal N/A 05/21/2014    Procedure: REMOVAL PORT-A-CATH;  Surgeon: Tamara Jarred, MD;  Location: [facility];  Service: General;  Laterality: N/A;  . Tee without cardioversion N/A 05/26/2014    Procedure: TRANSESOPHAGEAL ECHOCARDIOGRAM (TEE);  Surgeon: Tamara Dresser, MD;  Location: [facility] ENDOSCOPY;  Service: Cardiovascular;  Laterality: N/A;    FAMILY HISTORY Family History  Problem Relation Age of  Onset  . Breast cancer Mother 67    currently 52  . Diabetes Father   . Pancreatic cancer Father 5  . Breast cancer Paternal Aunt 59    currently 90; BRCA1 positive  . Stroke Maternal Grandfather   . Cancer Paternal Aunt     unk. primary; deceased 52s  . Breast cancer Cousin     daughter of unaffected paternal aunt; dx in her 42s    GYNECOLOGIC HISTORY:   SOCIAL HISTORY:     ADVANCED  DIRECTIVES:    HEALTH MAINTENANCE: History  Substance Use Topics  . Smoking status: Former Smoker -- 0.25 packs/day for 15 years    Quit date: 02/18/2009  . Smokeless tobacco: Former Systems developer  . Alcohol Use: Yes     Comment: occasional      Mammogram: Colonoscopy: Bone Density Scan:  Pap Smear:  Eye Exam:  Vitamin D Level:   Lipid Panel:    Allergies  Allergen Reactions  . Compazine [Prochlorperazine Edisylate] Other (See Comments)    Current Outpatient Prescriptions  Medication Sig Dispense Refill  . carvedilol (COREG) 3.125 MG tablet Take 1 tablet (3.125 mg total) by mouth 2 (two) times daily with a meal.  60 tablet  6  . cloNIDine (CATAPRES) 0.2 MG tablet Take 1 tablet (0.2 mg total) by mouth 2 (two) times daily.  60 tablet  0  . enoxaparin (LOVENOX) 100 MG/ML injection Inject 0.95 mLs (95 mg total) into the skin every 12 (twelve) hours.  60 Syringe  0  . ferrous sulfate 325 (65 FE) MG EC tablet Take 1 tablet (325 mg total) by mouth 3 (three) times daily with meals.  90 tablet  3  . hydrALAZINE (APRESOLINE) 50 MG tablet Take 1 tablet (50 mg total) by mouth every 8 (eight) hours.  90 tablet  0  . ibuprofen (ADVIL,MOTRIN) 600 MG tablet Take 1 tablet (600 mg total) by mouth every 6 (six) hours as needed.  30 tablet  0  . lisinopril (PRINIVIL,ZESTRIL) 40 MG tablet Take 1 tablet (40 mg total) by mouth daily.  30 tablet  0  . loratadine (CLARITIN) 10 MG tablet Take 10 mg by mouth daily as needed (For inflammation after receiving her injections during chemo.).       Marland Kitchen LORazepam (ATIVAN) 0.5 MG tablet Take 1 tablet (0.5 mg total) by mouth every 6 (six) hours as needed (Nausea or vomiting).  30 tablet  0  . pantoprazole (PROTONIX) 40 MG tablet Take 1 tablet (40 mg total) by mouth daily. For stomach irritation.  30 tablet  1  . PRESCRIPTION MEDICATION She receives her chemo treatments at the [facility]. Her oncologist [name] Dr. Humphrey [name]. She received her last chemo  treatments of Taxotere 170m Injection, Carboplatin 8457m Herceptin 52568mnd Perjeta 420m61m 04/08/14. She also received Neulasta 6mg 30mection on 04/09/14.      . proMarland Kitchenethazine (PHENERGAN) 25 MG tablet Take 1 tablet (25 mg total) by mouth every 6 (six) hours as needed for nausea or vomiting.  30 tablet  0  . dexamethasone (DECADRON) 4 MG tablet Take 2 tablets (8 mg total) by mouth 2 (two) times daily. Start the day before Taxotere. Then again the day after chemo for 3 days.  30 tablet  1  . fluconazole (DIFLUCAN) 100 MG tablet Take 2 tablets today, then 1 tablet daily  8 tablet  0  . lidocaine-prilocaine (EMLA) cream Apply 1 application topically as needed (For port-a-cath.).      .Marland Kitchen  ondansetron (ZOFRAN) 8 MG tablet Take 1 tablet (8 mg total) by mouth 2 (two) times daily. Start the day after chemo for 3 days. Then take as needed for nausea or vomiting.  30 tablet  1  . oxycodone (OXY-IR) 5 MG capsule Take 1 tab every 4-6 hours as needed for pain  12 capsule  0  . oxyCODONE-acetaminophen (PERCOCET/ROXICET) 5-325 MG per tablet Take 1 tablet by mouth every 4 (four) hours as needed for severe pain.        No current facility-administered medications for this visit.    OBJECTIVE: Filed Vitals:   06/17/14 1332  BP: 130/85  Pulse: 93  Temp: 98.4 F (36.9 C)  Resp: 20     Body mass index [name] 31.68 kg/(m^2).     GENERAL: Patient [name] a well appearing female in no acute distress HEENT:  Sclerae anicteric.  Oropharynx clear and moist. No ulcerations or evidence of oropharyngeal candidiasis. Neck [name] supple.  NODES:  No cervical, supraclavicular, or axillary lymphadenopathy palpated.  BREAST EXAM:  Deferred. LUNGS:  Clear to auscultation bilaterally.  No wheezes or rhonchi. HEART:  Regular rate and rhythm. No murmur appreciated. ABDOMEN:  Soft, nontender.  Positive, normoactive bowel sounds. No organomegaly palpated. MSK:  No focal spinal tenderness to palpation. Full range of motion bilaterally in the  upper extremities. EXTREMITIES:  No peripheral edema.   SKIN:  Clear with no obvious rashes or skin changes. No nail dyscrasia. NEURO:  Nonfocal. Well oriented.  Appropriate affect. ECOG FS:1 - Symptomatic but completely ambulatory  LAB RESULTS:  CMP     Component Value Date/Time   NA 140 06/17/2014 1309   NA 143 05/29/2014 0525   K 3.6 06/17/2014 1309   K 3.6* 05/29/2014 0525   CL 102 05/29/2014 0525   CO2 25 06/17/2014 1309   CO2 27 05/29/2014 0525   GLUCOSE 115 06/17/2014 1309   GLUCOSE 97 05/29/2014 0525   BUN 13.4 06/17/2014 1309   BUN 7 05/29/2014 0525   CREATININE 0.9 06/17/2014 1309   CREATININE 0.75 05/29/2014 0525   CALCIUM 9.3 06/17/2014 1309   CALCIUM 9.2 05/29/2014 0525   PROT 6.8 06/17/2014 1309   PROT 7.3 05/28/2014 1652   ALBUMIN 3.5 06/17/2014 1309   ALBUMIN 2.5* 05/28/2014 1652   AST 16 06/17/2014 1309   AST 12 05/28/2014 1652   ALT 23 06/17/2014 1309   ALT 13 05/28/2014 1652   ALKPHOS 81 06/17/2014 1309   ALKPHOS 81 05/28/2014 1652   BILITOT <0.20 06/17/2014 1309   BILITOT 0.3 05/28/2014 1652   GFRNONAA >90 05/29/2014 0525   GFRAA >90 05/29/2014 0525    I No results found for this basename: SPEP, UPEP,  kappa and lambda light chains    Lab Results  Component Value Date   WBC 17.0* 06/17/2014   NEUTROABS 11.1* 06/17/2014   HGB 10.3* 06/17/2014   HCT 32.6* 06/17/2014   MCV 100.3 06/17/2014   PLT 413* 06/17/2014      Chemistry      Component Value Date/Time   NA 140 06/17/2014 1309   NA 143 05/29/2014 0525   K 3.6 06/17/2014 1309   K 3.6* 05/29/2014 0525   CL 102 05/29/2014 0525   CO2 25 06/17/2014 1309   CO2 27 05/29/2014 0525   BUN 13.4 06/17/2014 1309   BUN 7 05/29/2014 0525   CREATININE 0.9 06/17/2014 1309   CREATININE 0.75 05/29/2014 0525      Component Value Date/Time   CALCIUM 9.3  06/17/2014 1309   CALCIUM 9.2 05/29/2014 0525   ALKPHOS 81 06/17/2014 1309   ALKPHOS 81 05/28/2014 1652   AST 16 06/17/2014 1309   AST 12 05/28/2014 1652   ALT 23 06/17/2014 1309   ALT 13  05/28/2014 1652   BILITOT <0.20 06/17/2014 1309   BILITOT 0.3 05/28/2014 1652       No results found for this basename: LABCA2    No components found with this basename: [id number]    No results found for this basename: INR,  in the last 168 hours  Urinalysis    Component Value Date/Time   COLORURINE AMBER* 05/19/2014 2001   APPEARANCEUR HAZY* 05/19/2014 2001   LABSPEC 1.022 05/19/2014 2001   PHURINE 5.5 05/19/2014 2001   GLUCOSEU NEGATIVE 05/19/2014 2001   HGBUR SMALL* 05/19/2014 2001   BILIRUBINUR SMALL* 05/19/2014 2001   KETONESUR 15* 05/19/2014 2001   PROTEINUR 100* 05/19/2014 2001   UROBILINOGEN 1.0 05/19/2014 2001   NITRITE NEGATIVE 05/19/2014 2001   LEUKOCYTESUR MODERATE* 05/19/2014 2001    STUDIES: Dg Chest 2 View  05/19/2014   CLINICAL DATA:  Chest pain.  Cough.  EXAM: CHEST  2 VIEW  COMPARISON:  CT chest 05/11/2014  FINDINGS: Power port catheter noted with tip projected over right atrium. Minimal subsegmental atelectasis/infiltrate right lung base as noted on recent CT of 05/11/2014. Stable cardiomegaly with normal pulmonary vascularity. No pleural effusion or pneumothorax. No acute bony abnormality .  IMPRESSION: Minimal subsegmental atelectasis and/or infiltrate right lung bases as noted on recent CT of 05/11/2014. No other acute or focal abnormalities.   Electronically Signed   By: Marcello Moores  Register   On: 05/19/2014 16:17   Dg Abd 1 View  05/28/2014   CLINICAL DATA:  43 year old female with nausea vomiting diarrhea. Intractable vomiting. Breast cancer undergoing chemotherapy.  EXAM: ABDOMEN - 1 VIEW  COMPARISON:  CT Abdomen and Pelvis 02/19/2014.  FINDINGS: Two supine views. Non obstructed bowel gas pattern. Stable visualized osseous structures. Small pelvic phleboliths. Abdominal and pelvic visceral contours are within normal limits.  IMPRESSION: Non obstructed bowel gas pattern.   Electronically Signed   By: Lars Pinks M.D.   On: 05/28/2014 20:44   Mr Lumbar Spine W Wo  Contrast  05/22/2014   CLINICAL DATA:  Breast cancer. Staph bacteremia. Infected Port-A-Cath removed May 21, 2014. New onset lumbar spine pain.  EXAM: MRI LUMBAR SPINE WITHOUT AND WITH CONTRAST  TECHNIQUE: Multiplanar and multiecho pulse sequences of the lumbar spine were obtained without and with intravenous contrast.  CONTRAST:  21m MULTIHANCE GADOBENATE DIMEGLUMINE 529 MG/ML IV SOLN  COMPARISON:  None.  FINDINGS: The vertebral bodies of the lumbar spine are normal in size and alignment. The marrow [name] diffusely low in signal likely secondary to patient receiving chemotherapy. The intervertebral disc spaces are well-maintained. There are no areas of abnormal enhancement.  The spinal cord [name] normal in signal and contour. The cord terminates normally at L1 . The nerve roots of the cauda equina and the filum terminale are normal. There [name] no epidural fluid collection.  The visualized portions of the SI joints are unremarkable.  The imaged intra-abdominal contents are unremarkable.  T12-L1: No significant disc bulge. No evidence of neural foraminal stenosis. No central canal stenosis.  L1-L2: No significant disc bulge. No evidence of neural foraminal stenosis. No central canal stenosis.  L2-L3: No significant disc bulge. No evidence of neural foraminal stenosis. No central canal stenosis.  L3-L4: No significant disc bulge. No evidence of neural foraminal  stenosis. No central canal stenosis.  L4-L5: No significant disc bulge. No evidence of neural foraminal stenosis. No central canal stenosis.  L5-S1: No significant disc bulge. No evidence of neural foraminal stenosis. No central canal stenosis.  IMPRESSION: 1. Normal MRI of the lumbar spine.   Electronically Signed   By: Kathreen Devoid   On: 05/22/2014 12:48   Dg Chest Port 1 View  05/28/2014   CLINICAL DATA:  PICC line placement  EXAM: PORTABLE CHEST - 1 VIEW  COMPARISON:  05/19/2014  FINDINGS: Borderline cardiomegaly. Mild basilar atelectasis. No pulmonary edema.  There [name] left arm PICC line with tip in right atrium. For distal SVC position PICC line should be retracted about 1 cm. No pneumothorax.  IMPRESSION: Left arm PICC line with tip in right atrium. For distal SVC position the PICC line should be retracted about 1 cm.   Electronically Signed   By: Lahoma Crocker M.D.   On: 05/28/2014 10:58   Mr Attempted Daymon Larsen Report  06/14/2014   This examination belongs to an outside facility and [name] stored  here for comparison purposes only.  Contact the originating outside  institution for any associated report or interpretation.   ASSESSMENT: 43 y.o.  woman with  1. 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  2. Patient [name] a BRCA1 mutation carrier.  3. Patient undergoing neoadjuvant chemotherapy consisting of Docetaxel, Carboplatin, Trastuzumab, and Pertuzumab with a total of 6 cycles planned.    4.  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   5. Patient developed MSSA port infection and septicemia.  She had a PICC line placed and completed antibiotic therapy with ANCEF.    6.  Last echocardiogram on 05/05/2014 demonstrated a LVEF of 50-55%.  She was evaluated by Dr. Haroldine Laws.  She was started on low dose carvedilol due to her palpitations, and was cleared to continue Herceptin based therapy.  She was recommended 2 month follow up.    PLAN:   Tamara [name] [name] doing well today.  I reviewed her labs with her in detail.  They were stable.  She will proceed with treatment today.  I reviewed with her all of her appointments and we will move things around a bit due to the delay caused by her port infection.  I placed a scheduling request to delay the MRI until following her last chemotherapy treatment.  She would like a female Psychologist, sport and exercise, so I will get her set up with Dr. Barry Dienes.  I also requested scheduling of her sixth and final treatment with neoadjuvant  chemotherapy.    Jeslynn will return in one week for labs and evaluation of chemotoxicities.    I spent 25 minutes counseling the patient face to face.  The total time spent in the appointment was 30 minutes.  Minette Headland, [place] [phone] 06/17/2014 2:15 PM

## 2014-06-18 ENCOUNTER — Ambulatory Visit: Payer: Medicaid Other

## 2014-06-19 ENCOUNTER — Encounter: Payer: Self-pay | Admitting: Adult Health

## 2014-06-21 ENCOUNTER — Encounter (INDEPENDENT_AMBULATORY_CARE_PROVIDER_SITE_OTHER): Payer: Self-pay | Admitting: Surgery

## 2014-06-21 ENCOUNTER — Telehealth: Payer: Self-pay | Admitting: *Deleted

## 2014-06-21 ENCOUNTER — Telehealth: Payer: Self-pay | Admitting: Adult Health

## 2014-06-21 NOTE — Telephone Encounter (Signed)
Spoke with patient. Visited brother-in-law over the weekend who is sick. Starting Saturday patient had runny nose. States drainage in clear. No fever, no sore throat or head congestion. Patient on blood pressure medications and wondering what medications are okay to take. Per Dr. Marko Plume, okay for patient to take OTC benadryl, tylenol and claritin. Also, patient needs to drink lots of fluids. Patient agreeable to this and told to call back if she develops fever or symptoms worsen. Patient agreeable to this and appreciative of the call.

## 2014-06-21 NOTE — Telephone Encounter (Signed)
s.w. pt and advised the 7.23 appt cx per LC....advised on 7.29 appt...pt ok adn aware

## 2014-06-22 ENCOUNTER — Telehealth (INDEPENDENT_AMBULATORY_CARE_PROVIDER_SITE_OTHER): Payer: Self-pay

## 2014-06-22 ENCOUNTER — Encounter: Payer: Medicaid Other | Admitting: Internal Medicine

## 2014-06-22 ENCOUNTER — Telehealth: Payer: Self-pay | Admitting: *Deleted

## 2014-06-22 NOTE — Telephone Encounter (Signed)
Patient left voicemail stating she missed appointment with Dr. Barry Dienes yesterday and wondering if she needed to reschedule it. I called patient back and stressed to her the importance of rescheduling and keeping her appointment with Dr. Barry Dienes. She states they have available appointment on 07/09/14 and she will call back and let [facility] know she will take it. Patient also reminded of appointment next week at [facility] and of MRI scheduled 07/07/14. Patient appreciative of call.

## 2014-06-22 NOTE — Telephone Encounter (Signed)
Pt called in to make po appt. She does not want to see Dr Paulene Floor. She would like to f/u with Dr Barry Dienes who has seen her in past. Spoke to [facility] who scheduled her an appt for 8/3 at 3:00.

## 2014-06-23 ENCOUNTER — Ambulatory Visit (HOSPITAL_COMMUNITY): Payer: Medicaid Other

## 2014-06-24 ENCOUNTER — Encounter: Payer: Self-pay | Admitting: *Deleted

## 2014-06-24 ENCOUNTER — Other Ambulatory Visit: Payer: Medicaid Other

## 2014-06-24 ENCOUNTER — Ambulatory Visit: Payer: Medicaid Other | Admitting: Adult Health

## 2014-06-25 ENCOUNTER — Other Ambulatory Visit: Payer: Self-pay

## 2014-06-25 DIAGNOSIS — I82409 Acute embolism and thrombosis of unspecified deep veins of unspecified lower extremity: Secondary | ICD-10-CM

## 2014-06-25 MED ORDER — ENOXAPARIN SODIUM 100 MG/ML ~~LOC~~ SOLN: 95.0000 mg | Freq: Two times a day (BID) | SUBCUTANEOUS | Status: DC

## 2014-06-25 NOTE — Telephone Encounter (Signed)
Tamara [name] called requesting a refill on her Lovenox injections.  Sent a refill to [facility] [address].   Attempted to call her at [phone] to let her know prescription sent in.  The voice mail box was full and a message could not be legt.

## 2014-06-28 ENCOUNTER — Telehealth: Payer: Self-pay | Admitting: Oncology

## 2014-06-28 ENCOUNTER — Encounter: Payer: Self-pay | Admitting: *Deleted

## 2014-06-28 ENCOUNTER — Other Ambulatory Visit: Payer: Self-pay | Admitting: Internal Medicine

## 2014-06-28 DIAGNOSIS — I10 Essential (primary) hypertension: Secondary | ICD-10-CM

## 2014-06-28 NOTE — Telephone Encounter (Signed)
added f/u w/GM for 8/6 per 7/27 pof. appt scheuduled for 8/6 per the 7/27 pof vs 8/7 per the 7/23 pof. lmonvm informing pt. other appts remain the same. pt to get new schedule at next appt 7/29. appt for 7/29 also confirmed in message.

## 2014-06-29 ENCOUNTER — Inpatient Hospital Stay: Payer: Medicaid Other | Admitting: Internal Medicine

## 2014-06-29 ENCOUNTER — Other Ambulatory Visit: Payer: Self-pay | Admitting: *Deleted

## 2014-06-29 DIAGNOSIS — C50412 Malignant neoplasm of upper-outer quadrant of left female breast: Secondary | ICD-10-CM

## 2014-06-30 ENCOUNTER — Ambulatory Visit (HOSPITAL_BASED_OUTPATIENT_CLINIC_OR_DEPARTMENT_OTHER): Payer: Medicaid Other | Admitting: Adult Health

## 2014-06-30 ENCOUNTER — Ambulatory Visit: Payer: Medicaid Other

## 2014-06-30 ENCOUNTER — Ambulatory Visit (HOSPITAL_BASED_OUTPATIENT_CLINIC_OR_DEPARTMENT_OTHER): Payer: Medicaid Other

## 2014-06-30 ENCOUNTER — Other Ambulatory Visit (HOSPITAL_BASED_OUTPATIENT_CLINIC_OR_DEPARTMENT_OTHER): Payer: Medicaid Other

## 2014-06-30 ENCOUNTER — Telehealth: Payer: Self-pay | Admitting: Adult Health

## 2014-06-30 ENCOUNTER — Encounter: Payer: Self-pay | Admitting: Adult Health

## 2014-06-30 ENCOUNTER — Other Ambulatory Visit: Payer: Self-pay | Admitting: *Deleted

## 2014-06-30 VITALS — BP 123/83 | HR 86 | Temp 98.1°F

## 2014-06-30 VITALS — BP 123/83 | HR 86 | Temp 98.1°F | Resp 19 | Ht 65.0 in | Wt 194.0 lb

## 2014-06-30 DIAGNOSIS — Z1501 Genetic susceptibility to malignant neoplasm of breast: Secondary | ICD-10-CM

## 2014-06-30 DIAGNOSIS — I82B19 Acute embolism and thrombosis of unspecified subclavian vein: Secondary | ICD-10-CM

## 2014-06-30 DIAGNOSIS — Z5111 Encounter for antineoplastic chemotherapy: Secondary | ICD-10-CM

## 2014-06-30 DIAGNOSIS — C50419 Malignant neoplasm of upper-outer quadrant of unspecified female breast: Secondary | ICD-10-CM

## 2014-06-30 DIAGNOSIS — I82C19 Acute embolism and thrombosis of unspecified internal jugular vein: Secondary | ICD-10-CM

## 2014-06-30 DIAGNOSIS — Z5112 Encounter for antineoplastic immunotherapy: Secondary | ICD-10-CM

## 2014-06-30 DIAGNOSIS — C50412 Malignant neoplasm of upper-outer quadrant of left female breast: Secondary | ICD-10-CM

## 2014-06-30 DIAGNOSIS — Z452 Encounter for adjustment and management of vascular access device: Secondary | ICD-10-CM

## 2014-06-30 DIAGNOSIS — Z1509 Genetic susceptibility to other malignant neoplasm: Secondary | ICD-10-CM

## 2014-06-30 DIAGNOSIS — Z171 Estrogen receptor negative status [ER-]: Secondary | ICD-10-CM

## 2014-06-30 LAB — COMPREHENSIVE METABOLIC PANEL (CC13)
ALBUMIN: 3.5 g/dL (ref 3.5–5.0)
ALT: 26 U/L (ref 0–55)
AST: 14 U/L (ref 5–34)
Alkaline Phosphatase: 66 U/L (ref 40–150)
Anion Gap: 8 mEq/L (ref 3–11)
BUN: 11.5 mg/dL (ref 7.0–26.0)
CALCIUM: 9.3 mg/dL (ref 8.4–10.4)
CHLORIDE: 108 meq/L (ref 98–109)
CO2: 24 mEq/L (ref 22–29)
Creatinine: 0.8 mg/dL (ref 0.6–1.1)
Glucose: 133 mg/dl (ref 70–140)
POTASSIUM: 3.9 meq/L (ref 3.5–5.1)
Sodium: 140 mEq/L (ref 136–145)
TOTAL PROTEIN: 7.1 g/dL (ref 6.4–8.3)
Total Bilirubin: <0.2 mg/dL (ref 0.20–1.20)

## 2014-06-30 LAB — CBC WITH DIFFERENTIAL/PLATELET
BASO%: 0 % (ref 0.0–2.0)
Basophils Absolute: 0 10*3/uL (ref 0.0–0.1)
EOS ABS: 0 10*3/uL (ref 0.0–0.5)
EOS%: 0 % (ref 0.0–7.0)
HEMATOCRIT: 30.6 % — AB (ref 34.8–46.6)
HGB: 10 g/dL — ABNORMAL LOW (ref 11.6–15.9)
LYMPH#: 0.9 10*3/uL (ref 0.9–3.3)
LYMPH%: 12.7 % — ABNORMAL LOW (ref 14.0–49.7)
MCH: 31.7 pg (ref 25.1–34.0)
MCHC: 32.7 g/dL (ref 31.5–36.0)
MCV: 97.1 fL (ref 79.5–101.0)
MONO#: 0.4 10*3/uL (ref 0.1–0.9)
MONO%: 5.2 % (ref 0.0–14.0)
NEUT%: 82.1 % — ABNORMAL HIGH (ref 38.4–76.8)
NEUTROS ABS: 6 10*3/uL (ref 1.5–6.5)
NRBC: 0 % (ref 0–0)
Platelets: 107 10*3/uL — ABNORMAL LOW (ref 145–400)
RBC: 3.15 10*6/uL — AB (ref 3.70–5.45)
RDW: 17.2 % — ABNORMAL HIGH (ref 11.2–14.5)
WBC: 7.3 10*3/uL (ref 3.9–10.3)

## 2014-06-30 MED ORDER — CARBOPLATIN CHEMO INJECTION 600 MG/60ML
840.0000 mg | Freq: Once | INTRAVENOUS | Status: AC
  Administered 2014-06-30: 840 mg via INTRAVENOUS
  Filled 2014-06-30: qty 84

## 2014-06-30 MED ORDER — DEXAMETHASONE SODIUM PHOSPHATE 20 MG/5ML IJ SOLN
INTRAMUSCULAR | Status: AC
  Filled 2014-06-30: qty 5

## 2014-06-30 MED ORDER — HEPARIN SOD (PORK) LOCK FLUSH 100 UNIT/ML IV SOLN
500.0000 [IU] | Freq: Once | INTRAVENOUS | Status: AC
  Administered 2014-06-30: 250 [IU] via INTRAVENOUS
  Filled 2014-06-30: qty 5

## 2014-06-30 MED ORDER — PALONOSETRON HCL INJECTION 0.25 MG/5ML
0.2500 mg | Freq: Once | INTRAVENOUS | Status: AC
  Administered 2014-06-30: 0.25 mg via INTRAVENOUS

## 2014-06-30 MED ORDER — PALONOSETRON HCL INJECTION 0.25 MG/5ML
INTRAVENOUS | Status: AC
  Filled 2014-06-30: qty 5

## 2014-06-30 MED ORDER — TRASTUZUMAB CHEMO INJECTION 440 MG
6.0000 mg/kg | Freq: Once | INTRAVENOUS | Status: DC
  Filled 2014-06-30: qty 25

## 2014-06-30 MED ORDER — SODIUM CHLORIDE 0.9 % IJ SOLN
10.0000 mL | INTRAMUSCULAR | Status: DC | PRN
  Administered 2014-06-30: 10 mL
  Filled 2014-06-30: qty 10

## 2014-06-30 MED ORDER — HEPARIN SOD (PORK) LOCK FLUSH 100 UNIT/ML IV SOLN
250.0000 [IU] | Freq: Once | INTRAVENOUS | Status: AC | PRN
  Administered 2014-06-30: 250 [IU]
  Filled 2014-06-30: qty 5

## 2014-06-30 MED ORDER — SODIUM CHLORIDE 0.9 % IV SOLN: Freq: Once | INTRAVENOUS | Status: DC

## 2014-06-30 MED ORDER — ACETAMINOPHEN 325 MG PO TABS
ORAL_TABLET | ORAL | Status: AC
  Filled 2014-06-30: qty 2

## 2014-06-30 MED ORDER — DIPHENHYDRAMINE HCL 25 MG PO CAPS
50.0000 mg | ORAL_CAPSULE | Freq: Once | ORAL | Status: AC
  Administered 2014-06-30: 50 mg via ORAL

## 2014-06-30 MED ORDER — DEXAMETHASONE SODIUM PHOSPHATE 20 MG/5ML IJ SOLN
12.0000 mg | Freq: Once | INTRAMUSCULAR | Status: AC
  Administered 2014-06-30: 12 mg via INTRAVENOUS

## 2014-06-30 MED ORDER — SODIUM CHLORIDE 0.9 % IJ SOLN
10.0000 mL | INTRAMUSCULAR | Status: DC | PRN
  Administered 2014-06-30: 10 mL via INTRAVENOUS
  Filled 2014-06-30: qty 10

## 2014-06-30 MED ORDER — SODIUM CHLORIDE 0.9 % IV SOLN
Freq: Once | INTRAVENOUS | Status: AC
  Administered 2014-06-30: 11:00:00 via INTRAVENOUS

## 2014-06-30 MED ORDER — FOSAPREPITANT DIMEGLUMINE INJECTION 150 MG
150.0000 mg | Freq: Once | INTRAVENOUS | Status: AC
  Administered 2014-06-30: 150 mg via INTRAVENOUS
  Filled 2014-06-30: qty 5

## 2014-06-30 MED ORDER — SODIUM CHLORIDE 0.9 % IV SOLN
420.0000 mg | Freq: Once | INTRAVENOUS | Status: AC
  Administered 2014-06-30: 420 mg via INTRAVENOUS
  Filled 2014-06-30: qty 14

## 2014-06-30 MED ORDER — ACETAMINOPHEN 325 MG PO TABS
650.0000 mg | ORAL_TABLET | Freq: Once | ORAL | Status: AC
  Administered 2014-06-30: 650 mg via ORAL

## 2014-06-30 MED ORDER — DEXTROSE 5 % IV SOLN
75.0000 mg/m2 | Freq: Once | INTRAVENOUS | Status: AC
  Administered 2014-06-30: 150 mg via INTRAVENOUS
  Filled 2014-06-30: qty 15

## 2014-06-30 MED ORDER — DIPHENHYDRAMINE HCL 25 MG PO CAPS
ORAL_CAPSULE | ORAL | Status: AC
  Filled 2014-06-30: qty 2

## 2014-06-30 NOTE — Telephone Encounter (Signed)
, °

## 2014-06-30 NOTE — Progress Notes (Signed)
ID: [name] OB: [birth year]  MR#: [id number]  CSN#:[id number]  PCP: Angelica Chessman, MD GYN:   SU: Dr. Brantley Stage OTHER MD: Dr. 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  BREAST CANCER HISTORY:   Patient found a left breast mass in the upper outer quadrant, evaluated with mammogram at [facility] 01-18-14 with a 3.2 cm mass in the 2:00 position 7 cm from the nipple. There was also an 8 mm mass 8 cm from the nipple in the ipsilateral breast, as well as positive lymph nodes. 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 MRI confirmed 2.8 and 1.3 cm mass is as well as lymph node. On the right a 6 mm nodule, biopsied and negative for malignancy. PET scan 03-03-01 had hypermetabolic left breast mass consistent with known neoplasm and FDG positive left axillary lymph nodes,benign-appearing brown fat activity and muscular activity in the  neck and chest but no findings for metastatic disease involving the  neck, chest, abdomen, pelvis or bones. Moderate FDG activity in the endometrial canal is thought likely due to secretory phase of ovulation or menses. No mass, uterine fibroids present.  CT CAP 02-19-14 had 3 cm left breast mass, enlarged left axillary lymph nodes are positive and no CT findings for metastatic disease involving the chest,  abdomen or pelvis and no evidence of osseous metastatic disease. Mildly enlarged fibroid uterus.    CURRENT THERAPY: neoadjuvant chemotherapy with Taxotere carboplatin, Herceptin and perjeta every 3 weeks planned for a total of 6 cycles.  Cycle 6 day 1    INTERVAL HISTORY:  Tahjanae is returning for  follow up prior to receiving her sixth and final cycle of chemotherapy.  She is doing well today.  She denies fevers, chills, nausea, vomiting, constipation, diarrhea, numbness/tingling, skin changes, or any further concerns.  She continues to have a left arm PICC and is taking Enoxaparin BID and tolerating it well.  She does have some bruising on her abdomen, but denies easy bleeding.    REVIEW OF SYSTEMS: A 10 point review of systems was conducted and is otherwise negative except for what is noted above.    PAST MEDICAL HISTORY: Past Medical History  Diagnosis Date  . Endometriosis   . Hypertension   . Rash     fine rash on abd  . Anemia   . Wears glasses   . 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 Iron deficiency anemia, unspecified 02/25/2014  . BRCA1 positive     c.190T>G (p.Cys64Gly) @ [facility]     PAST SURGICAL HISTORY: Past Surgical History  Procedure Laterality Date  . Cervical polypectomy  2010  . Cesarean section      one previous  . Tubal ligation    . Portacath placement Right 02/23/2014    Procedure: INSERTION PORT-A-CATH;  Surgeon: Joyice Faster. Cornett, MD;  Location: [place];  Service: General;  Laterality: Right;  . Port-a-cath removal N/A 05/21/2014    Procedure: REMOVAL PORT-A-CATH;  Surgeon: Zenovia Jarred, MD;  Location: [place];  Service: General;  Laterality: N/A;  . Tee without cardioversion N/A 05/26/2014    Procedure: TRANSESOPHAGEAL ECHOCARDIOGRAM (TEE);  Surgeon: Larey Dresser, MD;  Location: [place];  Service: Cardiovascular;  Laterality: N/A;  FAMILY HISTORY Family History  Problem Relation Age of Onset  . Breast cancer Mother 26    currently 34  . Diabetes Father   . Pancreatic cancer Father 54  . Breast cancer Paternal Aunt 21    currently 35; BRCA1 positive  . Stroke Maternal Grandfather   . Cancer Paternal Aunt     unk. primary; deceased 48s  . Breast cancer Cousin     daughter of unaffected paternal aunt; dx in her  28s    GYNECOLOGIC HISTORY:   SOCIAL HISTORY:     ADVANCED DIRECTIVES:    HEALTH MAINTENANCE: History  Substance Use Topics  . Smoking status: Former Smoker -- 0.25 packs/day for 15 years    Quit date: 02/18/2009  . Smokeless tobacco: Former Systems developer  . Alcohol Use: Yes     Comment: occasional      Mammogram: Colonoscopy: Bone Density Scan:  Pap Smear:  Eye Exam:  Vitamin D Level:   Lipid Panel:    Allergies  Allergen Reactions  . Compazine [Prochlorperazine Edisylate] Other (See Comments)    Current Outpatient Prescriptions  Medication Sig Dispense Refill  . carvedilol (COREG) 3.125 MG tablet Take 1 tablet (3.125 mg total) by mouth 2 (two) times daily with a meal.  60 tablet  6  . cloNIDine (CATAPRES) 0.2 MG tablet Take 1 tablet (0.2 mg total) by mouth 2 (two) times daily.  60 tablet  0  . dexamethasone (DECADRON) 4 MG tablet Take 2 tablets (8 mg total) by mouth 2 (two) times daily. Start the day before Taxotere. Then again the day after chemo for 3 days.  30 tablet  1  . enoxaparin (LOVENOX) 100 MG/ML injection Inject 0.95 mLs (95 mg total) into the skin every 12 (twelve) hours.  60 Syringe  1  . ferrous sulfate 325 (65 FE) MG EC tablet Take 1 tablet (325 mg total) by mouth 3 (three) times daily with meals.  90 tablet  3  . hydrALAZINE (APRESOLINE) 50 MG tablet Take 1 tablet (50 mg total) by mouth every 8 (eight) hours.  90 tablet  0  . lidocaine-prilocaine (EMLA) cream Apply 1 application topically as needed (For port-a-cath.).      Marland Kitchen lisinopril (PRINIVIL,ZESTRIL) 40 MG tablet TAKE 1 TABLET BY MOUTH EVERY DAY  30 tablet  0  . loratadine (CLARITIN) 10 MG tablet Take 10 mg by mouth daily as needed (For inflammation after receiving her injections during chemo.).       Marland Kitchen LORazepam (ATIVAN) 0.5 MG tablet Take 1 tablet (0.5 mg total) by mouth every 6 (six) hours as needed (Nausea or vomiting).  30 tablet  0  . ondansetron (ZOFRAN) 8 MG tablet Take 1 tablet (8 mg total) by  mouth 2 (two) times daily. Start the day after chemo for 3 days. Then take as needed for nausea or vomiting.  30 tablet  1  . oxycodone (OXY-IR) 5 MG capsule Take 1 tab every 4-6 hours as needed for pain  12 capsule  0  . oxyCODONE-acetaminophen (PERCOCET/ROXICET) 5-325 MG per tablet Take 1 tablet by mouth every 4 (four) hours as needed for severe pain.       . pantoprazole (PROTONIX) 40 MG tablet Take 1 tablet (40 mg total) by mouth daily. For stomach irritation.  30 tablet  1  . PRESCRIPTION MEDICATION She receives her chemo treatments at the [facility]. Her oncologist is Dr. Humphrey Rolls. She received her last chemo treatments of Taxotere 166m Injection, Carboplatin  816m, Herceptin 5213mand Perjeta 42030mn 04/08/14. She also received Neulasta 6mg24mjection on 04/09/14.      . prMarland Kitchenmethazine (PHENERGAN) 25 MG tablet Take 1 tablet (25 mg total) by mouth every 6 (six) hours as needed for nausea or vomiting.  30 tablet  0  . ibuprofen (ADVIL,MOTRIN) 600 MG tablet Take 1 tablet (600 mg total) by mouth every 6 (six) hours as needed.  30 tablet  0   No current facility-administered medications for this visit.    OBJECTIVE: Filed Vitals:   06/30/14 0959  BP: 123/83  Pulse: 86  Temp: 98.1 F (36.7 C)  Resp: 19     Body mass index is 32.28 kg/(m^2).     GENERAL: Patient is a well appearing female in no acute distress HEENT:  Sclerae anicteric.  Oropharynx clear and moist. No ulcerations or evidence of oropharyngeal candidiasis. Neck is supple.  NODES:  No cervical, supraclavicular, or axillary lymphadenopathy palpated.  BREAST EXAM:  Deferred. LUNGS:  Clear to auscultation bilaterally.  No wheezes or rhonchi. HEART:  Regular rate and rhythm. No murmur appreciated. ABDOMEN:  Soft, nontender.  Positive, normoactive bowel sounds. No organomegaly palpated. MSK:  No focal spinal tenderness to palpation. Full range of motion bilaterally in the upper extremities. EXTREMITIES:  No  peripheral edema.   SKIN:  Clear with no obvious rashes or skin changes. No nail dyscrasia. NEURO:  Nonfocal. Well oriented.  Appropriate affect. ECOG FS:1 - Symptomatic but completely ambulatory  LAB RESULTS:  CMP     Component Value Date/Time   NA 140 06/30/2014 0914   NA 143 05/29/2014 0525   K 3.9 06/30/2014 0914   K 3.6* 05/29/2014 0525   CL 102 05/29/2014 0525   CO2 24 06/30/2014 0914   CO2 27 05/29/2014 0525   GLUCOSE 133 06/30/2014 0914   GLUCOSE 97 05/29/2014 0525   BUN 11.5 06/30/2014 0914   BUN 7 05/29/2014 0525   CREATININE 0.8 06/30/2014 0914   CREATININE 0.75 05/29/2014 0525   CALCIUM 9.3 06/30/2014 0914   CALCIUM 9.2 05/29/2014 0525   PROT 7.1 06/30/2014 0914   PROT 7.3 05/28/2014 1652   ALBUMIN 3.5 06/30/2014 0914   ALBUMIN 2.5* 05/28/2014 1652   AST 14 06/30/2014 0914   AST 12 05/28/2014 1652   ALT 26 06/30/2014 0914   ALT 13 05/28/2014 1652   ALKPHOS 66 06/30/2014 0914   ALKPHOS 81 05/28/2014 1652   BILITOT <0.20 06/30/2014 0914   BILITOT 0.3 05/28/2014 1652   GFRNONAA >90 05/29/2014 0525   GFRAA >90 05/29/2014 0525    I No results found for this basename: SPEP,  UPEP,   kappa and lambda light chains    Lab Results  Component Value Date   WBC 7.3 06/30/2014   NEUTROABS 6.0 06/30/2014   HGB 10.0* 06/30/2014   HCT 30.6* 06/30/2014   MCV 97.1 06/30/2014   PLT 107* 06/30/2014      Chemistry      Component Value Date/Time   NA 140 06/30/2014 0914   NA 143 05/29/2014 0525   K 3.9 06/30/2014 0914   K 3.6* 05/29/2014 0525   CL 102 05/29/2014 0525   CO2 24 06/30/2014 0914   CO2 27 05/29/2014 0525   BUN 11.5 06/30/2014 0914   BUN 7 05/29/2014 0525   CREATININE 0.8 06/30/2014 0914   CREATININE 0.75 05/29/2014 0525      Component Value Date/Time   CALCIUM 9.3 06/30/2014 0914   CALCIUM 9.2 05/29/2014 0525  ALKPHOS 66 06/30/2014 0914   ALKPHOS 81 05/28/2014 1652   AST 14 06/30/2014 0914   AST 12 05/28/2014 1652   ALT 26 06/30/2014 0914   ALT 13 05/28/2014 1652   BILITOT <0.20 06/30/2014  0914   BILITOT 0.3 05/28/2014 1652       No results found for this basename: LABCA2    No components found with this basename: [id number]    No results found for this basename: INR,  in the last 168 hours  Urinalysis    Component Value Date/Time   COLORURINE AMBER* 05/19/2014 2001   APPEARANCEUR HAZY* 05/19/2014 2001   LABSPEC 1.022 05/19/2014 2001   PHURINE 5.5 05/19/2014 2001   GLUCOSEU NEGATIVE 05/19/2014 2001   HGBUR SMALL* 05/19/2014 2001   BILIRUBINUR SMALL* 05/19/2014 2001   KETONESUR 15* 05/19/2014 2001   PROTEINUR 100* 05/19/2014 2001   UROBILINOGEN 1.0 05/19/2014 2001   NITRITE NEGATIVE 05/19/2014 2001   LEUKOCYTESUR MODERATE* 05/19/2014 2001    STUDIES: Dg Chest 2 View  05/19/2014   CLINICAL DATA:  Chest pain.  Cough.  EXAM: CHEST  2 VIEW  COMPARISON:  CT chest 05/11/2014  FINDINGS: Power port catheter noted with tip projected over right atrium. Minimal subsegmental atelectasis/infiltrate right lung base as noted on recent CT of 05/11/2014. Stable cardiomegaly with normal pulmonary vascularity. No pleural effusion or pneumothorax. No acute bony abnormality .  IMPRESSION: Minimal subsegmental atelectasis and/or infiltrate right lung bases as noted on recent CT of 05/11/2014. No other acute or focal abnormalities.   Electronically Signed   By: Marcello Moores  Register   On: 05/19/2014 16:17   Dg Abd 1 View  05/28/2014   CLINICAL DATA:  43 year old female with nausea vomiting diarrhea. Intractable vomiting. Breast cancer undergoing chemotherapy.  EXAM: ABDOMEN - 1 VIEW  COMPARISON:  CT Abdomen and Pelvis 02/19/2014.  FINDINGS: Two supine views. Non obstructed bowel gas pattern. Stable visualized osseous structures. Small pelvic phleboliths. Abdominal and pelvic visceral contours are within normal limits.  IMPRESSION: Non obstructed bowel gas pattern.   Electronically Signed   By: Lars Pinks M.D.   On: 05/28/2014 20:44   Mr Lumbar Spine W Wo Contrast  05/22/2014   CLINICAL DATA:  Breast cancer.  Staph bacteremia. Infected Port-A-Cath removed May 21, 2014. New onset lumbar spine pain.  EXAM: MRI LUMBAR SPINE WITHOUT AND WITH CONTRAST  TECHNIQUE: Multiplanar and multiecho pulse sequences of the lumbar spine were obtained without and with intravenous contrast.  CONTRAST:  43m MULTIHANCE GADOBENATE DIMEGLUMINE 529 MG/ML IV SOLN  COMPARISON:  None.  FINDINGS: The vertebral bodies of the lumbar spine are normal in size and alignment. The marrow is diffusely low in signal likely secondary to patient receiving chemotherapy. The intervertebral disc spaces are well-maintained. There are no areas of abnormal enhancement.  The spinal cord is normal in signal and contour. The cord terminates normally at L1 . The nerve roots of the cauda equina and the filum terminale are normal. There is no epidural fluid collection.  The visualized portions of the SI joints are unremarkable.  The imaged intra-abdominal contents are unremarkable.  T12-L1: No significant disc bulge. No evidence of neural foraminal stenosis. No central canal stenosis.  L1-L2: No significant disc bulge. No evidence of neural foraminal stenosis. No central canal stenosis.  L2-L3: No significant disc bulge. No evidence of neural foraminal stenosis. No central canal stenosis.  L3-L4: No significant disc bulge. No evidence of neural foraminal stenosis. No central canal stenosis.  L4-L5: No significant disc  bulge. No evidence of neural foraminal stenosis. No central canal stenosis.  L5-S1: No significant disc bulge. No evidence of neural foraminal stenosis. No central canal stenosis.  IMPRESSION: 1. Normal MRI of the lumbar spine.   Electronically Signed   By: Kathreen Devoid   On: 05/22/2014 12:48   Dg Chest Port 1 View  05/28/2014   CLINICAL DATA:  PICC line placement  EXAM: PORTABLE CHEST - 1 VIEW  COMPARISON:  05/19/2014  FINDINGS: Borderline cardiomegaly. Mild basilar atelectasis. No pulmonary edema. There is left arm PICC line with tip in right atrium.  For distal SVC position PICC line should be retracted about 1 cm. No pneumothorax.  IMPRESSION: Left arm PICC line with tip in right atrium. For distal SVC position the PICC line should be retracted about 1 cm.   Electronically Signed   By: Lahoma Crocker M.D.   On: 05/28/2014 10:58   Mr Attempted Daymon Larsen Report  06/14/2014   This examination belongs to an outside facility and is stored  here for comparison purposes only.  Contact the originating outside  institution for any associated report or interpretation.   ASSESSMENT: 43 y.o. [place] woman with  1. 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  2. Patient is a BRCA1 mutation carrier.  3. Patient undergoing neoadjuvant chemotherapy consisting of Docetaxel, Carboplatin, Trastuzumab, and Pertuzumab with a total of 6 cycles planned.    4.  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   5. Patient developed MSSA port infection and septicemia.  Port was removed, she had a PICC line placed and completed antibiotic therapy with ANCEF.    6.  Last echocardiogram on 05/05/2014 demonstrated a LVEF of 50-55%.  She was evaluated by Dr. Haroldine Laws.  She was started on low dose carvedilol due to her palpitations, and was cleared to continue Herceptin based therapy.  She was recommended 2 month follow up and this is scheduled on 07/05/2014.      PLAN:   Miles is doing well today.  Her labs are stable and she will proceed with cycle 6 of treatment today.  I reviewed her lab work with her in detail.  She does have f/u MRI 07/07/14 and f/u with Dr. Barry Dienes on 07/05/2014.    Zella will continue taking Enoxaparin BID.  She is tolerating this well.   Lubertha will return tomorrow for Neulasta and on 07/08/2014 for labs and eval by Dr. Jana Hakim.    We reviewed the above plan in detail today.   I spent 25 minutes counseling the patient face to face.  The total  time spent in the appointment was 30 minutes.  Minette Headland, [place] [phone] 06/30/2014 10:51 AM

## 2014-06-30 NOTE — Patient Instructions (Signed)
[facility] Discharge Instructions for Patients Receiving Chemotherapy  Today you received the following chemotherapy agents : Herceptin, Perjeta, Taxotere, Carboplatin  To help prevent nausea and vomiting after your treatment, we encourage you to take your nausea medications as directed:  Decadron 8 mg twice daily X 3 days-start tomorrow  Ativan 0.5 mg ever 6 hours as needed  Phenergan 25 mg every 6 hours as needed  Zofran 8 mg every 12 hours as needed-start 3rd day after chemo tx  If you develop nausea and vomiting that is not controlled by your nausea medication, call the clinic.   Return tomorrow for Neulasta and IV fluids  BELOW ARE SYMPTOMS THAT SHOULD BE REPORTED IMMEDIATELY:  *FEVER GREATER THAN 100.5 F  *CHILLS WITH OR WITHOUT FEVER  NAUSEA AND VOMITING THAT IS NOT CONTROLLED WITH YOUR NAUSEA MEDICATION  *UNUSUAL SHORTNESS OF BREATH  *UNUSUAL BRUISING OR BLEEDING  TENDERNESS IN MOUTH AND THROAT WITH OR WITHOUT PRESENCE OF ULCERS  *URINARY PROBLEMS  *BOWEL PROBLEMS  UNUSUAL RASH Items with * indicate a potential emergency and should be followed up as soon as possible.  Feel free to call the clinic should you have any questions or concerns. The clinic phone number is [phone].  It has been a pleasure to serve you today! Congratulations on your last treatment!

## 2014-06-30 NOTE — Progress Notes (Signed)
Will return tomorrow for IV fluids and antiemetics with her Neulasta. Scheduler notified to make appointment. Per Charlestine Massed, leave PICC line in until she is seen by Dr. Jana Hakim next week.

## 2014-06-30 NOTE — Patient Instructions (Signed)
PICC Home Guide A peripherally inserted central catheter (PICC) is a long, thin, flexible tube that is inserted into a vein in the upper arm. It is a form of intravenous (IV) access. It is considered to be a "central" line because the tip of the PICC ends in a large vein in your chest. This large vein is called the superior vena cava (SVC). The PICC tip ends in the SVC because there is a lot of blood flow in the SVC. This allows medicines and IV fluids to be quickly distributed throughout the body. The PICC is inserted using a sterile technique by a specially trained nurse or physician. After the PICC is inserted, a chest X-ray exam is done to be sure it is in the correct place.  A PICC may be placed for different reasons, such as:  To give medicines and liquid nutrition that can only be given through a central line. Examples are:  Certain antibiotic treatments.  Chemotherapy.  Total parenteral nutrition (TPN).  To take frequent blood samples.  To give IV fluids and blood products.  If there is difficulty placing a peripheral intravenous (PIV) catheter. If taken care of properly, a PICC can remain in place for several months. A PICC can also allow a person to go home from the hospital early. Medicine and PICC care can be managed at home by a family member or home health care team. WHAT PROBLEMS CAN HAPPEN WHEN I HAVE A PICC? Problems with a PICC can occasionally occur. These may include the following:  A blood clot (thrombus) forming in or at the tip of the PICC. This can cause the PICC to become clogged. A clot-dissolving medicine called tissue plasminogen activator (tPA) can be given through the PICC to help break up the clot.  Inflammation of the vein (phlebitis) in which the PICC is placed. Signs of inflammation may include redness, pain at the insertion site, red streaks, or being able to feel a "cord" in the vein where the PICC is located.  Infection in the PICC or at the insertion  site. Signs of infection may include fever, chills, redness, swelling, or pus drainage from the PICC insertion site.  PICC movement (malposition). The PICC tip may move from its original position due to excessive physical activity, forceful coughing, sneezing, or vomiting.  A break or cut in the PICC. It is important to not use scissors near the PICC.  Nerve or tendon irritation or injury during PICC insertion. WHAT SHOULD I KEEP IN MIND ABOUT ACTIVITIES WHEN I HAVE A PICC?  You may bend your arm and move it freely. If your PICC is near or at the bend of your elbow, avoid activity with repeated motion at the elbow.  Rest at home for the remainder of the day following PICC line insertion.  Avoid lifting heavy objects as instructed by your health care provider.  Avoid using a crutch with the arm on the same side as your PICC. You may need to use a walker. WHAT SHOULD I KNOW ABOUT MY PICC DRESSING?  Keep your PICC bandage (dressing) clean and dry to prevent infection.  Ask your health care provider when you may shower. Ask your health care provider to teach you how to wrap the PICC when you do take a shower.  Change the PICC dressing as instructed by your health care provider.  Change your PICC dressing if it becomes loose or wet. WHAT SHOULD I KNOW ABOUT PICC CARE?  Check the PICC insertion site   daily for leakage, redness, swelling, or pain.  Do not take a bath, swim, or use hot tubs when you have a PICC. Cover PICC line with clear plastic wrap and tape to keep it dry while showering.  Flush the PICC as directed by your health care provider. Let your health care provider know right away if the PICC is difficult to flush or does not flush. Do not use force to flush the PICC.  Do not use a syringe that is less than 10 mL to flush the PICC.  Never pull or tug on the PICC.  Avoid blood pressure checks on the arm with the PICC.  Keep your PICC identification card with you at all  times.  Do not take the PICC out yourself. Only a trained clinical professional should remove the PICC. SEEK IMMEDIATE MEDICAL CARE IF:  Your PICC is accidentally pulled all the way out. If this happens, cover the insertion site with a bandage or gauze dressing. Do not throw the PICC away. Your health care provider will need to inspect it.  Your PICC was tugged or pulled and has partially come out. Do not  push the PICC back in.  There is any type of drainage, redness, or swelling where the PICC enters the skin.  You cannot flush the PICC, it is difficult to flush, or the PICC leaks around the insertion site when it is flushed.  You hear a "flushing" sound when the PICC is flushed.  You have pain, discomfort, or numbness in your arm, shoulder, or jaw on the same side as the PICC.  You feel your heart "racing" or skipping beats.  You notice a hole or tear in the PICC.  You develop chills or a fever. MAKE SURE YOU:   Understand these instructions.  Will watch your condition.  Will get help right away if you are not doing well or get worse. Document Released: 05/26/2003 Document Revised: 04/05/2014 Document Reviewed: 07/27/2013 [employer] Patient Information 2015 [employer]. This information is not intended to replace advice given to you by your health care provider. Make sure you discuss any questions you have with your health care provider.  

## 2014-07-01 ENCOUNTER — Ambulatory Visit (HOSPITAL_BASED_OUTPATIENT_CLINIC_OR_DEPARTMENT_OTHER): Payer: Medicaid Other

## 2014-07-01 VITALS — BP 123/80 | HR 81 | Temp 97.5°F | Resp 18

## 2014-07-01 DIAGNOSIS — C50419 Malignant neoplasm of upper-outer quadrant of unspecified female breast: Secondary | ICD-10-CM

## 2014-07-01 DIAGNOSIS — C50412 Malignant neoplasm of upper-outer quadrant of left female breast: Secondary | ICD-10-CM

## 2014-07-01 DIAGNOSIS — I10 Essential (primary) hypertension: Secondary | ICD-10-CM

## 2014-07-01 DIAGNOSIS — R112 Nausea with vomiting, unspecified: Secondary | ICD-10-CM

## 2014-07-01 DIAGNOSIS — R197 Diarrhea, unspecified: Secondary | ICD-10-CM

## 2014-07-01 DIAGNOSIS — Z5189 Encounter for other specified aftercare: Secondary | ICD-10-CM

## 2014-07-01 MED ORDER — HEPARIN SOD (PORK) LOCK FLUSH 100 UNIT/ML IV SOLN
250.0000 [IU] | INTRAVENOUS | Status: AC | PRN
  Administered 2014-07-01: 250 [IU]
  Filled 2014-07-01: qty 5

## 2014-07-01 MED ORDER — PEGFILGRASTIM INJECTION 6 MG/0.6ML
6.0000 mg | Freq: Once | SUBCUTANEOUS | Status: AC
  Administered 2014-07-01: 6 mg via SUBCUTANEOUS
  Filled 2014-07-01: qty 0.6

## 2014-07-01 MED ORDER — PROMETHAZINE HCL 25 MG/ML IJ SOLN
12.5000 mg | Freq: Once | INTRAMUSCULAR | Status: AC
  Administered 2014-07-01: 12.5 mg via INTRAVENOUS
  Filled 2014-07-01: qty 1

## 2014-07-01 MED ORDER — SODIUM CHLORIDE 0.9 % IV SOLN
Freq: Once | INTRAVENOUS | Status: AC
  Administered 2014-07-01: 15:00:00 via INTRAVENOUS

## 2014-07-01 MED ORDER — LORAZEPAM 2 MG/ML IJ SOLN
INTRAMUSCULAR | Status: AC
  Filled 2014-07-01: qty 1

## 2014-07-01 MED ORDER — LORAZEPAM 2 MG/ML IJ SOLN
1.0000 mg | Freq: Once | INTRAMUSCULAR | Status: AC
  Administered 2014-07-01: 1 mg via INTRAVENOUS

## 2014-07-01 MED ORDER — SODIUM CHLORIDE 0.9 % IJ SOLN
10.0000 mL | INTRAMUSCULAR | Status: AC | PRN
  Administered 2014-07-01: 10 mL
  Filled 2014-07-01: qty 10

## 2014-07-01 NOTE — Patient Instructions (Signed)
Dehydration, Adult Dehydration is when you lose more fluids from the body than you take in. Vital organs like the kidneys, brain, and heart cannot function without a proper amount of fluids and salt. Any loss of fluids from the body can cause dehydration.  CAUSES   Vomiting.  Diarrhea.  Excessive sweating.  Excessive urine output.  Fever. SYMPTOMS  Mild dehydration  Thirst.  Dry lips.  Slightly dry mouth. Moderate dehydration  Very dry mouth.  Sunken eyes.  Skin does not bounce back quickly when lightly pinched and released.  Dark urine and decreased urine production.  Decreased tear production.  Headache. Severe dehydration  Very dry mouth.  Extreme thirst.  Rapid, weak pulse (more than 100 beats per minute at rest).  Cold hands and feet.  Not able to sweat in spite of heat and temperature.  Rapid breathing.  Blue lips.  Confusion and lethargy.  Difficulty being awakened.  Minimal urine production.  No tears. DIAGNOSIS  Your caregiver will diagnose dehydration based on your symptoms and your exam. Blood and urine tests will help confirm the diagnosis. The diagnostic evaluation should also identify the cause of dehydration. TREATMENT  Treatment of mild or moderate dehydration can often be done at home by increasing the amount of fluids that you drink. It is best to drink small amounts of fluid more often. Drinking too much at one time can make vomiting worse. Refer to the home care instructions below. Severe dehydration needs to be treated at the hospital where you will probably be given intravenous (IV) fluids that contain water and electrolytes. HOME CARE INSTRUCTIONS   Ask your caregiver about specific rehydration instructions.  Drink enough fluids to keep your urine clear or pale yellow.  Drink small amounts frequently if you have nausea and vomiting.  Eat as you normally do.  Avoid:  Foods or drinks high in sugar.  Carbonated  drinks.  Juice.  Extremely hot or cold fluids.  Drinks with caffeine.  Fatty, greasy foods.  Alcohol.  Tobacco.  Overeating.  Gelatin desserts.  Wash your hands well to avoid spreading bacteria and viruses.  Only take over-the-counter or prescription medicines for pain, discomfort, or fever as directed by your caregiver.  Ask your caregiver if you should continue all prescribed and over-the-counter medicines.  Keep all follow-up appointments with your caregiver. SEEK MEDICAL CARE IF:  You have abdominal pain and it increases or stays in one area (localizes).  You have a rash, stiff neck, or severe headache.  You are irritable, sleepy, or difficult to awaken.  You are weak, dizzy, or extremely thirsty. SEEK IMMEDIATE MEDICAL CARE IF:   You are unable to keep fluids down or you get worse despite treatment.  You have frequent episodes of vomiting or diarrhea.  You have blood or green matter (bile) in your vomit.  You have blood in your stool or your stool looks black and tarry.  You have not urinated in 6 to 8 hours, or you have only urinated a small amount of very dark urine.  You have a fever.  You faint. MAKE SURE YOU:   Understand these instructions.  Will watch your condition.  Will get help right away if you are not doing well or get worse. Document Released: 11/19/2005 Document Revised: 02/11/2012 Document Reviewed: 07/09/2011 ExitCare Patient Information 2015 [employer]. This information is not intended to replace advice given to you by your health care provider. Make sure you discuss any questions you have with your health care   provider.  

## 2014-07-01 NOTE — Progress Notes (Signed)
Due to patient's blood pressure being elevated only administer 500 mL normal saline instead of the 1 liter per Dr. Jana Hakim and Charlestine Massed, NP

## 2014-07-02 ENCOUNTER — Telehealth: Payer: Self-pay

## 2014-07-02 DIAGNOSIS — C50412 Malignant neoplasm of upper-outer quadrant of left female breast: Secondary | ICD-10-CM

## 2014-07-02 NOTE — Telephone Encounter (Signed)
Tamara [name] was calling to see if it would be ok for her to go swimming in the indoor pool on vacation 07-16-14.  She just had her last treatment on 06-30-14. Told Tamara [name] that she should discuss this with Dr. Jana Hakim at the visit on 07-08-14.  He will have her blood counts at that visit and this will help him determine if she is at grater risk for infection being in a community pool.

## 2014-07-05 ENCOUNTER — Ambulatory Visit (INDEPENDENT_AMBULATORY_CARE_PROVIDER_SITE_OTHER): Payer: Medicaid Other | Admitting: General Surgery

## 2014-07-05 ENCOUNTER — Encounter (HOSPITAL_COMMUNITY): Payer: Self-pay

## 2014-07-05 ENCOUNTER — Other Ambulatory Visit: Payer: Self-pay

## 2014-07-05 ENCOUNTER — Ambulatory Visit (HOSPITAL_COMMUNITY)
Admission: RE | Admit: 2014-07-05 | Discharge: 2014-07-05 | Disposition: A | Discharge status: Home / Self Care | Payer: Medicaid Other | Source: Ambulatory Visit | Attending: Internal Medicine | Admitting: Internal Medicine

## 2014-07-05 ENCOUNTER — Encounter (INDEPENDENT_AMBULATORY_CARE_PROVIDER_SITE_OTHER): Payer: Self-pay | Admitting: General Surgery

## 2014-07-05 ENCOUNTER — Ambulatory Visit (HOSPITAL_BASED_OUTPATIENT_CLINIC_OR_DEPARTMENT_OTHER)
Admission: RE | Admit: 2014-07-05 | Discharge: 2014-07-05 | Disposition: A | Discharge status: Home / Self Care | Payer: Medicaid Other | Source: Ambulatory Visit | Attending: Internal Medicine | Admitting: Internal Medicine

## 2014-07-05 VITALS — BP 142/84 | HR 72 | Temp 98.6°F | Resp 16 | Ht 65.0 in | Wt 197.2 lb

## 2014-07-05 VITALS — BP 112/62 | HR 94 | Wt 196.0 lb

## 2014-07-05 DIAGNOSIS — Z0189 Encounter for other specified special examinations: Secondary | ICD-10-CM | POA: Insufficient documentation

## 2014-07-05 DIAGNOSIS — C50419 Malignant neoplasm of upper-outer quadrant of unspecified female breast: Secondary | ICD-10-CM

## 2014-07-05 DIAGNOSIS — C50412 Malignant neoplasm of upper-outer quadrant of left female breast: Secondary | ICD-10-CM

## 2014-07-05 DIAGNOSIS — I517 Cardiomegaly: Secondary | ICD-10-CM

## 2014-07-05 DIAGNOSIS — I10 Essential (primary) hypertension: Secondary | ICD-10-CM

## 2014-07-05 DIAGNOSIS — I82409 Acute embolism and thrombosis of unspecified deep veins of unspecified lower extremity: Secondary | ICD-10-CM

## 2014-07-05 DIAGNOSIS — Z1509 Genetic susceptibility to other malignant neoplasm: Principal | ICD-10-CM

## 2014-07-05 DIAGNOSIS — R110 Nausea: Secondary | ICD-10-CM

## 2014-07-05 DIAGNOSIS — Z1501 Genetic susceptibility to malignant neoplasm of breast: Secondary | ICD-10-CM

## 2014-07-05 DIAGNOSIS — I82401 Acute embolism and thrombosis of unspecified deep veins of right lower extremity: Secondary | ICD-10-CM

## 2014-07-05 MED ORDER — PROMETHAZINE HCL 25 MG PO TABS: 25.0000 mg | ORAL_TABLET | Freq: Four times a day (QID) | ORAL | Status: DC | PRN

## 2014-07-05 MED ORDER — CARVEDILOL 6.25 MG PO TABS: 6.2500 mg | ORAL_TABLET | Freq: Two times a day (BID) | ORAL | Status: DC

## 2014-07-05 MED ORDER — LORAZEPAM 0.5 MG PO TABS: 0.5000 mg | ORAL_TABLET | Freq: Four times a day (QID) | ORAL | Status: DC | PRN

## 2014-07-05 MED ORDER — CLONIDINE HCL 0.2 MG PO TABS: 0.1000 mg | ORAL_TABLET | Freq: Two times a day (BID) | ORAL | Status: DC

## 2014-07-05 NOTE — Progress Notes (Signed)
Patient ID: Tamara [name], female   DOB: 1971/04/18, 43 y.o.   MRN: [id number] Referring Physician: Dr. Jana Hakim Primary Care: [facility] Primary Cardiologist: N/A  HPI:  Tamara [name] is a 43 yo with a history of HTN and chronic anemia. She was diagnosed with L breast cancer in 3/15. 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 Lymph node was positive for metastatic disease.  Was treated with Taxotere and carboplatinum + Herceptin/perjeta x 6 cycles. At the end of this month will start getting herceptin/perjeta every 3 weeks for a year.   Baseline EF was noted to be a little low but other parameters were normal so we proceeded with Herceptin/perjeta.  Admitted with MSSA bacteremia due to port infection. Completed IV abx last week.  TEE in 6/15 with EF 50-55%. 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 Denies dyspnea or SOB. No edema. No f/c.    SH: Does not smoke or drink FH: Mother diagnosed breast CA at age 18, living        2 paternal aunts - breast cancer (1 deceased and 1 living)        Father- Deceased at age 68 of cancer not sure what kind        - Has 41 yo, 41 yo, 41 yo, 41 yo and 41 yo (3 boys and 2 girls)   Past Medical History  Diagnosis Date  . Endometriosis   . Hypertension   . Rash     fine rash on abd  . Anemia   . Wears glasses   . 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 Iron deficiency anemia, unspecified 02/25/2014  . BRCA1 positive     c.190T>G (p.Cys64Gly) @ [facility]     Current Outpatient Prescriptions  Medication Sig Dispense Refill  . carvedilol (COREG) 3.125 MG tablet Take 1 tablet (3.125 mg total) by mouth 2 (two) times daily  with a meal.  60 tablet  6  . cloNIDine (CATAPRES) 0.2 MG tablet Take 1 tablet (0.2 mg total) by mouth 2 (two) times daily.  60 tablet  0  . dexamethasone (DECADRON) 4 MG tablet Take 2 tablets (8 mg total) by mouth 2 (two) times daily. Start the day before Taxotere. Then again the day after chemo for 3 days.  30 tablet  1  . enoxaparin (LOVENOX) 100 MG/ML injection Inject 0.95 mLs (95 mg total) into the skin every 12 (twelve) hours.  60 Syringe  1  . ferrous sulfate 325 (65 FE) MG EC tablet Take 1 tablet (325 mg total) by mouth 3 (three) times daily with meals.  90 tablet  3  . hydrALAZINE (APRESOLINE) 50 MG tablet Take 1 tablet (50 mg total) by mouth every 8 (eight) hours.  90 tablet  0  . ibuprofen (ADVIL,MOTRIN) 600 MG tablet Take 1 tablet (600 mg total) by mouth every 6 (six) hours as needed.  30 tablet  0  . lidocaine-prilocaine (EMLA) cream Apply 1 application topically as needed (For port-a-cath.).      Marland Kitchen lisinopril (PRINIVIL,ZESTRIL) 40 MG tablet TAKE 1 TABLET BY MOUTH EVERY DAY  30 tablet  0  .  loratadine (CLARITIN) 10 MG tablet Take 10 mg by mouth daily as needed (For inflammation after receiving her injections during chemo.).       Marland Kitchen LORazepam (ATIVAN) 0.5 MG tablet Take 1 tablet (0.5 mg total) by mouth every 6 (six) hours as needed (Nausea or vomiting).  30 tablet  0  . ondansetron (ZOFRAN) 8 MG tablet Take 1 tablet (8 mg total) by mouth 2 (two) times daily. Start the day after chemo for 3 days. Then take as needed for nausea or vomiting.  30 tablet  1  . oxycodone (OXY-IR) 5 MG capsule Take 1 tab every 4-6 hours as needed for pain  12 capsule  0  . oxyCODONE-acetaminophen (PERCOCET/ROXICET) 5-325 MG per tablet Take 1 tablet by mouth every 4 (four) hours as needed for severe pain.       . pantoprazole (PROTONIX) 40 MG tablet Take 1 tablet (40 mg total) by mouth daily. For stomach irritation.  30 tablet  1  . PRESCRIPTION MEDICATION She receives her chemo treatments at the [facility]. Her oncologist is Dr. Humphrey Rolls. She received her last chemo treatments of Taxotere 135m Injection, Carboplatin 8447m Herceptin 52530mnd Perjeta 420m48m 04/08/14. She also received Neulasta 6mg 68mection on 04/09/14.      . proMarland Kitchenethazine (PHENERGAN) 25 MG tablet Take 1 tablet (25 mg total) by mouth every 6 (six) hours as needed for nausea or vomiting.  30 tablet  0   No current facility-administered medications for this encounter.    Allergies  Allergen Reactions  . Compazine [Prochlorperazine Edisylate] Other (See Comments)     Filed Vitals:   07/05/14 1057  BP: 112/62  Pulse: 94  Weight: 196 lb (88.905 kg)  SpO2: 98%   PHYSICAL EXAM: General:  Well appearing. No respiratory difficulty HEENT: normal Neck: supple. no JVD. Carotids 2+ bilat; no bruits. No lymphadenopathy or thryomegaly appreciated. Cor: PMI nondisplaced. Regular rate & rhythm. No rubs, gallops or murmurs. Lungs: clear Abdomen: soft, nontender, nondistended. No hepatosplenomegaly. No bruits or masses. Good bowel sounds. Extremities: no cyanosis, clubbing, rash, edema PICC in LUE Neuro: alert & oriented x 3, cranial nerves grossly intact. moves all 4 extremities w/o difficulty. Affect pleasant.  ASSESSMENT & PLAN:  1) Breast Cancer L: HER-2/neu positive with a proliferation marker Ki-67 79%. EF remains slightly depressed but parameters stable. Will continue herceptin/perjeta. Will follow closely with echos q2 months. Increase carvedilol to 6.25 bid. Continue lisinopril. Can consider cMRI at some point,    2) HTN - BP on low end. Will decrease clonidine to 0.1 bid. Increase carvedilol as above.   3) DVT - continue Lovenox   Tamara Bickers1:45 AM

## 2014-07-05 NOTE — Addendum Note (Signed)
Encounter addended by: Scarlette Calico, RN on: 07/05/2014 12:05 PM<BR>     Documentation filed: Patient Instructions Section, Orders

## 2014-07-05 NOTE — Patient Instructions (Signed)
Decrease Clonidine to 0.1 mg (1/2 tab) Twice daily   Increase Carvedilol to 6.25 mg Twice daily   We will contact you in 2 months to schedule your next appointment and echocardiogram

## 2014-07-05 NOTE — Progress Notes (Signed)
  Echocardiogram 2D Echocardiogram has been performed.  Darlina Sicilian M 07/05/2014, 11:00 AM

## 2014-07-05 NOTE — Patient Instructions (Signed)
I will send messages to the rest of the team and get you on for conference next week after your MRI is done so we can discuss plan.    See Dr. Migdalia Dk.    I will also get a surgery date in the next 4 weeks or so to get time for surgery.

## 2014-07-06 ENCOUNTER — Telehealth: Payer: Self-pay | Admitting: *Deleted

## 2014-07-06 DIAGNOSIS — D509 Iron deficiency anemia, unspecified: Secondary | ICD-10-CM

## 2014-07-06 MED ORDER — FERROUS SULFATE 325 (65 FE) MG PO TBEC: 325.0000 mg | DELAYED_RELEASE_TABLET | Freq: Three times a day (TID) | ORAL | Status: DC

## 2014-07-06 NOTE — Telephone Encounter (Signed)
Received VM from patient stating she is nauseated today and had diarrhea once this morning. Called patient back and she states she has taken nausea medication since calling and is no longer nauseated. Asked patient if she has had diarrhea after previous perjeta treatments and she states stools have been soft before but not diarrhea. Educated patient that diarrhea can be a side effect of perjeta and informed her to pick up some OTC Imodium and use as directed if she has anymore diarrhea today. Patient agreeable to this. While on the phone, patient asked if iron prescription could be sent to [facility] instead of [facility] as it was too expensive so she did not get the pills from [facility] this week. Told patient that would be fine and iron pills e-scribed to community [facility]. Patient reminded of appointment on 07/08/14 with Dr. Jana Hakim and told to call us with any further questions. Patient agreeable to this and appreciative of the call.

## 2014-07-06 NOTE — Progress Notes (Signed)
HISTORY:  Pt is a 43 yo F who is s/p neoadjuvant chemotherapy for a cT2N1M0 left breast cancer.  She has been found to be BRCA positive.  She had horrible port a cath infection and had to have it removed with packing.  She has been receiving chemo via PICC line.  She feels that she has had great response of mass to the chemo.  She does still have to receive herceptin until march 2016.     PERTINENT REVIEW OF SYSTEMS: O/w negative.    Filed Vitals:   07/05/14 1536  BP: 142/84  Pulse: 72  Temp: 98.6 F (37 C)  Resp: 16   Wt Readings from Last 3 Encounters:  07/05/14 197 lb 3.2 oz (89.449 kg)  07/05/14 196 lb (88.905 kg)  06/30/14 194 lb (87.998 kg)    EXAM: Head: Normocephalic and atraumatic.  Eyes:  Conjunctivae are normal. Pupils are equal, round, and reactive to light. No scleral icterus.  Neck:  Normal range of motion. Neck supple. No tracheal deviation present. No thyromegaly present.  Breast:  Left UOQ mass is not palpable.  No LAD palpable.  No skin dimpling.  Right side normal.   Chest wall:  Former port a cath incision nearly completely epithelialized.   Resp: No respiratory distress, normal effort. Abd:  Abdomen is soft, non distended and non tender. No masses are palpable.  There is no rebound and no guarding.  Neurological: Alert and oriented to person, place, and time. Coordination normal.  Skin: Skin is warm and dry. No rash noted. No diaphoretic. No erythema. No pallor.  Psychiatric: Normal mood and affect. Normal behavior. Judgment and thought content normal.      ASSESSMENT AND PLAN:   BRCA1 positive We plan bilateral mastectomies, we just have not yet determined the timing.    She also plans to get oophorectomy.    Breast cancer of upper-outer quadrant of left female breast Clinically this has reduced significantly.   We await MRI to see total tumor size and whether or not lymph nodes are still enlarged.    I will discuss patient with radiation and  plastics before coming up with final plan.   At the least, we will do left mastectomy with SLN bx.  We may end up doing bilateral mastectomies with L ALND.   I will find a date for her and after the other information is available, we will come up with a final plan.    30 min spent in counseling.        Milus Height, MD Surgical Oncology, [facility]  Angelica Chessman, MD Gordy Levan, MD

## 2014-07-06 NOTE — Assessment & Plan Note (Signed)
Clinically this has reduced significantly.   We await MRI to see total tumor size and whether or not lymph nodes are still enlarged.    I will discuss patient with radiation and plastics before coming up with final plan.   At the least, we will do left mastectomy with SLN bx.  We may end up doing bilateral mastectomies with L ALND.   I will find a date for her and after the other information is available, we will come up with a final plan.    30 min spent in counseling.

## 2014-07-06 NOTE — Assessment & Plan Note (Signed)
We plan bilateral mastectomies, we just have not yet determined the timing.    She also plans to get oophorectomy.

## 2014-07-07 ENCOUNTER — Ambulatory Visit (HOSPITAL_COMMUNITY)
Admission: RE | Admit: 2014-07-07 | Discharge: 2014-07-07 | Disposition: A | Discharge status: Home / Self Care | Payer: Medicaid Other | Source: Ambulatory Visit | Attending: Adult Health | Admitting: Adult Health

## 2014-07-07 DIAGNOSIS — Z9221 Personal history of antineoplastic chemotherapy: Secondary | ICD-10-CM | POA: Insufficient documentation

## 2014-07-07 DIAGNOSIS — C50919 Malignant neoplasm of unspecified site of unspecified female breast: Secondary | ICD-10-CM | POA: Insufficient documentation

## 2014-07-07 MED ORDER — GADOBENATE DIMEGLUMINE 529 MG/ML IV SOLN
18.0000 mL | Freq: Once | INTRAVENOUS | Status: AC | PRN
  Administered 2014-07-07: 18 mL via INTRAVENOUS

## 2014-07-08 ENCOUNTER — Other Ambulatory Visit: Payer: Self-pay | Admitting: Emergency Medicine

## 2014-07-08 ENCOUNTER — Telehealth: Payer: Self-pay | Admitting: Oncology

## 2014-07-08 ENCOUNTER — Ambulatory Visit (HOSPITAL_BASED_OUTPATIENT_CLINIC_OR_DEPARTMENT_OTHER): Payer: Medicaid Other

## 2014-07-08 ENCOUNTER — Ambulatory Visit (HOSPITAL_BASED_OUTPATIENT_CLINIC_OR_DEPARTMENT_OTHER): Payer: Medicaid Other | Admitting: Oncology

## 2014-07-08 ENCOUNTER — Other Ambulatory Visit: Payer: Self-pay

## 2014-07-08 ENCOUNTER — Other Ambulatory Visit (HOSPITAL_BASED_OUTPATIENT_CLINIC_OR_DEPARTMENT_OTHER): Payer: Medicaid Other

## 2014-07-08 VITALS — BP 154/104 | HR 100 | Temp 98.0°F | Resp 18 | Ht 65.0 in | Wt 195.0 lb

## 2014-07-08 DIAGNOSIS — C773 Secondary and unspecified malignant neoplasm of axilla and upper limb lymph nodes: Secondary | ICD-10-CM

## 2014-07-08 DIAGNOSIS — C50419 Malignant neoplasm of upper-outer quadrant of unspecified female breast: Secondary | ICD-10-CM

## 2014-07-08 DIAGNOSIS — Z1509 Genetic susceptibility to other malignant neoplasm: Principal | ICD-10-CM

## 2014-07-08 DIAGNOSIS — C50412 Malignant neoplasm of upper-outer quadrant of left female breast: Secondary | ICD-10-CM

## 2014-07-08 DIAGNOSIS — Z171 Estrogen receptor negative status [ER-]: Secondary | ICD-10-CM

## 2014-07-08 DIAGNOSIS — Z452 Encounter for adjustment and management of vascular access device: Secondary | ICD-10-CM

## 2014-07-08 DIAGNOSIS — Z86718 Personal history of other venous thrombosis and embolism: Secondary | ICD-10-CM

## 2014-07-08 DIAGNOSIS — Z1501 Genetic susceptibility to malignant neoplasm of breast: Secondary | ICD-10-CM

## 2014-07-08 LAB — CBC WITH DIFFERENTIAL/PLATELET
BASO%: 0.2 % (ref 0.0–2.0)
Basophils Absolute: 0 10*3/uL (ref 0.0–0.1)
EOS%: 0.1 % (ref 0.0–7.0)
Eosinophils Absolute: 0 10*3/uL (ref 0.0–0.5)
HEMATOCRIT: 31.4 % — AB (ref 34.8–46.6)
HGB: 10.3 g/dL — ABNORMAL LOW (ref 11.6–15.9)
LYMPH#: 2.6 10*3/uL (ref 0.9–3.3)
LYMPH%: 32.1 % (ref 14.0–49.7)
MCH: 32.4 pg (ref 25.1–34.0)
MCHC: 32.7 g/dL (ref 31.5–36.0)
MCV: 98.9 fL (ref 79.5–101.0)
MONO#: 0.6 10*3/uL (ref 0.1–0.9)
MONO%: 7.4 % (ref 0.0–14.0)
NEUT#: 4.8 10*3/uL (ref 1.5–6.5)
NEUT%: 60.2 % (ref 38.4–76.8)
PLATELETS: 195 10*3/uL (ref 145–400)
RBC: 3.17 10*6/uL — ABNORMAL LOW (ref 3.70–5.45)
RDW: 17.9 % — ABNORMAL HIGH (ref 11.2–14.5)
WBC: 8 10*3/uL (ref 3.9–10.3)

## 2014-07-08 LAB — COMPREHENSIVE METABOLIC PANEL (CC13)
ALT: 63 U/L — ABNORMAL HIGH (ref 0–55)
ANION GAP: 8 meq/L (ref 3–11)
AST: 24 U/L (ref 5–34)
Albumin: 3.8 g/dL (ref 3.5–5.0)
Alkaline Phosphatase: 87 U/L (ref 40–150)
BUN: 10 mg/dL (ref 7.0–26.0)
CALCIUM: 9.8 mg/dL (ref 8.4–10.4)
CHLORIDE: 108 meq/L (ref 98–109)
CO2: 27 meq/L (ref 22–29)
CREATININE: 0.7 mg/dL (ref 0.6–1.1)
Glucose: 100 mg/dl (ref 70–140)
Potassium: 3.8 mEq/L (ref 3.5–5.1)
Sodium: 142 mEq/L (ref 136–145)
Total Bilirubin: 0.2 mg/dL (ref 0.20–1.20)
Total Protein: 7.1 g/dL (ref 6.4–8.3)

## 2014-07-08 MED ORDER — SODIUM CHLORIDE 0.9 % IJ SOLN
10.0000 mL | INTRAMUSCULAR | Status: DC | PRN
  Administered 2014-07-08: 10 mL via INTRAVENOUS
  Filled 2014-07-08: qty 10

## 2014-07-08 MED ORDER — HEPARIN SOD (PORK) LOCK FLUSH 100 UNIT/ML IV SOLN
500.0000 [IU] | Freq: Once | INTRAVENOUS | Status: AC
  Administered 2014-07-08: 250 [IU] via INTRAVENOUS
  Filled 2014-07-08: qty 5

## 2014-07-08 MED ORDER — SODIUM CHLORIDE 0.9 % IJ SOLN
10.0000 mL | INTRAMUSCULAR | Status: DC | PRN
  Filled 2014-07-08: qty 10

## 2014-07-08 MED ORDER — HEPARIN SOD (PORK) LOCK FLUSH 100 UNIT/ML IV SOLN
500.0000 [IU] | Freq: Once | INTRAVENOUS | Status: DC
  Filled 2014-07-08: qty 5

## 2014-07-08 NOTE — Telephone Encounter (Signed)
Cld pt to adv of next appt-phone message stated pt not acceting calls at this time-will mail pt copy of sch for next appt

## 2014-07-08 NOTE — Progress Notes (Signed)
ID: Tamara [name] OB: March 27, 1971  MR#: [id number]  CSN#:[id number]  PCP: Tamara Chessman, MD GYN:   SU: Dr. Erroll [name] OTHER MD: Dr. 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 There was also an 8 mm mass 8 cm from the nipple in the ipsilateral breast, as well as positive lymph nodes. 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 MRI confirmed 2.8 and 1.3 cm mass is as well as lymph node. On the right a 6 mm nodule, biopsied and negative for malignancy. PET scan 2-83-15 had hypermetabolic left breast mass consistent with known neoplasm and FDG positive left axillary lymph nodes,benign-appearing brown fat activity and muscular activity in the  neck and chest but no findings for metastatic disease involving the neck, chest, abdomen, pelvis or bones. Moderate FDG activity in the endometrial canal is thought likely due to secretory phase of ovulation or menses. No mass, uterine fibroids present. CT [facility] 02-19-14 had 3 cm left breast mass, enlarged left axillary lymph nodes are positive and no CT findings for metastatic disease involving the chest,abdomen or pelvis and no evidence of osseous metastatic disease. Mildly enlarged fibroid uterus."   Her subsequent treatment is as detailed below   INTERVAL HISTORY:  Tamara [name] for followup of her breast  cancer. She is establishing herself in my service [name]. She completed 6 cycles of standard carboplatin/docetaxel/trastuzumab/pertuzumab 06/30/2014 ([name] is day 9 cycle 6) and just had a restaging MRI which shows a complete radiologic response. She a ready has appointments with Dr. Barry [name] and Dr. Migdalia [name] to plan her definitive surgery.   REVIEW OF SYSTEMS: Generally she did well with her chemotherapy, but was frequently nauseated. Partly this may be because she did not take the dexamethasone anti-nausea pills, which were causing her some visual changes. She also complains of taste alteration. For the past 4 days in addition she has had loose bowel movements, once or twice a day. She was told to take some Imodium but has not started that. She stopped having periods with chemotherapy and is having some hot flashes. She sleeps poorly. Sometimes she has palpitations. She can be short of breath when walking up stairs. She has joint pains "here and there", but not more intense or persistent than before. She never developed any peripheral neuropathy symptoms. A detailed review of systems [name] was otherwise noncontributory   PAST MEDICAL HISTORY: Past Medical History  Diagnosis Date  . Endometriosis   . Hypertension   . Rash     fine rash on abd  . Anemia   . Wears glasses   . 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 Iron deficiency anemia, unspecified 02/25/2014  . BRCA1 positive     c.190T>G (p.Cys64Gly) @ [facility]     PAST SURGICAL HISTORY: Past Surgical History  Procedure Laterality Date  . Cervical polypectomy  2010  . Cesarean section      one previous  . Tubal ligation    .  Portacath placement Right 02/23/2014    Procedure: INSERTION PORT-A-CATH;  Surgeon: Joyice Faster. Cornett, MD;  Location: [place];  Service: General;  Laterality: Right;  . Port-a-cath removal N/A 05/21/2014    Procedure: REMOVAL PORT-A-CATH;  Surgeon: Zenovia Jarred, MD;  Location: [place];  Service:  General;  Laterality: N/A;  . Tee without cardioversion N/A 05/26/2014    Procedure: TRANSESOPHAGEAL ECHOCARDIOGRAM (TEE);  Surgeon: Larey Dresser, MD;  Location: [facility] ENDOSCOPY;  Service: Cardiovascular;  Laterality: N/A;    FAMILY HISTORY Family History  Problem Relation Age of Onset  . Breast cancer Mother 41    currently 32  . Diabetes Father   . Pancreatic cancer Father 23  . Breast cancer Paternal Aunt 11    currently 33; BRCA1 positive  . Stroke Maternal Grandfather   . Cancer Paternal Aunt     unk. primary; deceased 40s  . Breast cancer Cousin     daughter of unaffected paternal aunt; dx in her 60s  [name] father died from prostate cancer the age of 55. [name] mother was diagnosed with breast cancer the age of 87. [name] father had 5 sisters, 3 of whom were diagnosed with breast cancer, 2 of them before the age of 86. The patient had one brother, no sisters. There is no history of ovarian cancer in the family.  GYNECOLOGIC HISTORY:  Menarche age 80, first live birth age 38, the patient is Scalp Level P5. Her periods stopped at the time of chemotherapy. 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 Her husband Tamara [name] since works as an Animal nutritionist. [name] oldest child, a son, Tamara [name], is studying Therapist, occupational; the patient is a 60 year old daughter Tamara [name] is also in college. [name] younger children are 68, 86, and 39. The patient attends a [employer]boricult[employer] church   ADVANCED DIRECTIVES: Not in place   HEALTH MAINTENANCE: History  Substance Use Topics  . Smoking status: Former Smoker -- 0.25 packs/day for 15 years    Quit date: 02/18/2009  . Smokeless tobacco: Former Systems developer  . Alcohol Use: Yes     Comment: occasional   Mammogram: Colonoscopy: Bone Density Scan:  Pap Smear:  Eye Exam:  Vitamin D Level:   Lipid Panel:    Allergies  Allergen Reactions  . Compazine  [Prochlorperazine Edisylate] Other (See Comments)    Current Outpatient Prescriptions  Medication Sig Dispense Refill  . carvedilol (COREG) 6.25 MG tablet Take 1 tablet (6.25 mg total) by mouth 2 (two) times daily with a meal.  60 tablet  6  . cloNIDine (CATAPRES) 0.2 MG tablet Take 0.5 tablets (0.1 mg total) by mouth 2 (two) times daily.  60 tablet  0  . dexamethasone (DECADRON) 4 MG tablet Take 2 tablets (8 mg total) by mouth 2 (two) times daily. Start the day before Taxotere. Then again the day after chemo for 3 days.  30 tablet  1  . enoxaparin (LOVENOX) 100 MG/ML injection Inject 0.95 mLs (95 mg total) into the skin every 12 (twelve) hours.  60 Syringe  1  . ferrous sulfate 325 (65 FE) MG EC tablet Take 1 tablet (325 mg total) by mouth 3 (three) times daily with meals.  90 tablet  3  . hydrALAZINE (APRESOLINE) 50 MG tablet Take 1 tablet (50 mg total) by mouth every 8 (eight) hours.  90 tablet  0  . ibuprofen (ADVIL,MOTRIN) 600  MG tablet Take 1 tablet (600 mg total) by mouth every 6 (six) hours as needed.  30 tablet  0  . lidocaine-prilocaine (EMLA) cream Apply 1 application topically as needed (For port-a-cath.).      Marland Kitchen lisinopril (PRINIVIL,ZESTRIL) 40 MG tablet TAKE 1 TABLET BY MOUTH EVERY DAY  30 tablet  0  . loratadine (CLARITIN) 10 MG tablet Take 10 mg by mouth daily as needed (For inflammation after receiving her injections during chemo.).       Marland Kitchen LORazepam (ATIVAN) 0.5 MG tablet Take 1 tablet (0.5 mg total) by mouth every 6 (six) hours as needed (Nausea or vomiting).  30 tablet  0  . ondansetron (ZOFRAN) 8 MG tablet Take 1 tablet (8 mg total) by mouth 2 (two) times daily. Start the day after chemo for 3 days. Then take as needed for nausea or vomiting.  30 tablet  1  . oxycodone (OXY-IR) 5 MG capsule Take 1 tab every 4-6 hours as needed for pain  12 capsule  0  . oxyCODONE-acetaminophen (PERCOCET/ROXICET) 5-325 MG per tablet Take 1 tablet by mouth every 4 (four) hours as needed for  severe pain.       . pantoprazole (PROTONIX) 40 MG tablet Take 1 tablet (40 mg total) by mouth daily. For stomach irritation.  30 tablet  1  . PRESCRIPTION MEDICATION She receives her chemo treatments at the [facility]. Her oncologist is Dr. Humphrey Rolls. She received her last chemo treatments of Taxotere 135m Injection, Carboplatin 8467m Herceptin 52577mnd Perjeta 420m87m 04/08/14. She also received Neulasta 6mg 59mection on 04/09/14.      . proMarland Kitchenethazine (PHENERGAN) 25 MG tablet Take 1 tablet (25 mg total) by mouth every 6 (six) hours as needed for nausea or vomiting.  30 tablet  0   Current Facility-Administered Medications  Medication Dose Route Frequency Provider Last Rate Last Dose  . heparin lock flush 100 unit/mL  500 Units Intravenous Once GustaChauncey Cruel     . sodium chloride 0.9 % injection 10 mL  10 mL Intravenous PRN GustaChauncey Cruel      Facility-Administered Medications Ordered in Other Visits  Medication Dose Route Frequency Provider Last Rate Last Dose  . sodium chloride 0.9 % injection 10 mL  10 mL Intravenous PRN GustaChauncey Cruel  10 mL at 07/08/14 1600    OBJECTIVE: Young-appearing African American woman in no acute distress Filed Vitals:   07/08/14 1624  BP: 154/104  Pulse:   Temp:   Resp:      Body mass index is 32.45 kg/(m^2).      ECOG FS:1 - Symptomatic but completely ambulatory  Sclerae unicteric, pupils equal and reactive Oropharynx clear and moist No cervical or supraclavicular adenopathy Lungs no rales or rhonchi Heart regular rate and rhythm Abd soft, nontender, positive bowel sounds MSK no focal spinal tenderness, no upper extremity lymphedema Neuro: nonfocal, well oriented, positive affect Breasts: The right breast is unremarkable. I do not palpate any masses in the left breast, and there are no skin or nipple changes of concern. The left axilla is benign.  LAB RESULTS:  CMP     Component Value Date/Time   NA  142 07/08/2014 1558   NA 143 05/29/2014 0525   K 3.8 07/08/2014 1558   K 3.6* 05/29/2014 0525   CL 102 05/29/2014 0525   CO2 27 07/08/2014 1558   CO2 27 05/29/2014 0525   GLUCOSE 100 07/08/2014  1558   GLUCOSE 97 05/29/2014 0525   BUN 10.0 07/08/2014 1558   BUN 7 05/29/2014 0525   CREATININE 0.7 07/08/2014 1558   CREATININE 0.75 05/29/2014 0525   CALCIUM 9.8 07/08/2014 1558   CALCIUM 9.2 05/29/2014 0525   PROT 7.1 07/08/2014 1558   PROT 7.3 05/28/2014 1652   ALBUMIN 3.8 07/08/2014 1558   ALBUMIN 2.5* 05/28/2014 1652   AST 24 07/08/2014 1558   AST 12 05/28/2014 1652   ALT 63* 07/08/2014 1558   ALT 13 05/28/2014 1652   ALKPHOS 87 07/08/2014 1558   ALKPHOS 81 05/28/2014 1652   BILITOT 0.20 07/08/2014 1558   BILITOT 0.3 05/28/2014 1652   GFRNONAA >90 05/29/2014 0525   GFRAA >90 05/29/2014 0525    I No results found for this basename: SPEP,  UPEP,   kappa and lambda light chains    Lab Results  Component Value Date   WBC 8.0 07/08/2014   NEUTROABS 4.8 07/08/2014   HGB 10.3* 07/08/2014   HCT 31.4* 07/08/2014   MCV 98.9 07/08/2014   PLT 195 07/08/2014      Chemistry      Component Value Date/Time   NA 142 07/08/2014 1558   NA 143 05/29/2014 0525   K 3.8 07/08/2014 1558   K 3.6* 05/29/2014 0525   CL 102 05/29/2014 0525   CO2 27 07/08/2014 1558   CO2 27 05/29/2014 0525   BUN 10.0 07/08/2014 1558   BUN 7 05/29/2014 0525   CREATININE 0.7 07/08/2014 1558   CREATININE 0.75 05/29/2014 0525      Component Value Date/Time   CALCIUM 9.8 07/08/2014 1558   CALCIUM 9.2 05/29/2014 0525   ALKPHOS 87 07/08/2014 1558   ALKPHOS 81 05/28/2014 1652   AST 24 07/08/2014 1558   AST 12 05/28/2014 1652   ALT 63* 07/08/2014 1558   ALT 13 05/28/2014 1652   BILITOT 0.20 07/08/2014 1558   BILITOT 0.3 05/28/2014 1652       No results found for this basename: LABCA2    No components found with this basename: CNOBS962    No results found for this basename: INR,  in the last 168 hours  Urinalysis    Component Value Date/Time   COLORURINE AMBER* 05/19/2014  2001   APPEARANCEUR HAZY* 05/19/2014 2001   LABSPEC 1.022 05/19/2014 2001   PHURINE 5.5 05/19/2014 2001   GLUCOSEU NEGATIVE 05/19/2014 2001   HGBUR SMALL* 05/19/2014 2001   BILIRUBINUR SMALL* 05/19/2014 2001   KETONESUR 15* 05/19/2014 2001   PROTEINUR 100* 05/19/2014 2001   UROBILINOGEN 1.0 05/19/2014 2001   NITRITE NEGATIVE 05/19/2014 2001   LEUKOCYTESUR MODERATE* 05/19/2014 2001    STUDIES: Mr Breast Bilateral W Wo Contrast  07/07/2014   CLINICAL DATA:  History of left breast cancer, evaluate response to neoadjuvant chemotherapy  LABS:  Not performed  EXAM: BILATERAL BREAST MRI WITH AND WITHOUT CONTRAST  TECHNIQUE: Multiplanar, multisequence MR images of both breasts were obtained prior to and following the intravenous administration of 26m of MultiHance.  THREE-DIMENSIONAL MR IMAGE RENDERING ON INDEPENDENT WORKSTATION:  Three-dimensional MR images were rendered by post-processing of the original MR data on an independent workstation. The three-dimensional MR images were interpreted, and findings are reported in the following complete MRI report for this study. Three dimensional images were evaluated at the independent DynaCad workstation  COMPARISON:  Previous exams including prior MRI 02/07/2014  FINDINGS: Breast composition: b.  Scattered fibroglandular tissue.  Background parenchymal enhancement: Moderate  Right breast: No mass or  abnormal enhancement. Marker clip 9 o'clock position from previous benign biopsy. Intramammary lymph node more posteriorly in the 9 o'clock position, measuring less than a cm.  Left breast: No mass or abnormal enhancement. Marker clips in the lateral left breast and in the axillary tail from prior biopsies with no associated mass or abnormal enhancement.  Lymph nodes: Improved appearance of lymph nodes in bilateral axilla. No significant enlargement or cortical thickening seen on the current examination.  Ancillary findings:  None.  IMPRESSION: Status post neoadjuvant  chemotherapy, no remaining abnormal enhancement or mass seen the left breast. No suspicious findings on either side currently.  RECOMMENDATION: Proceed with appropriate treatment planning  BI-RADS CATEGORY  6: Known biopsy-proven malignancy.   Electronically Signed   By: Skipper Cliche M.D.   On: 07/07/2014 12:29   Transthoracic Echocardiography  Patient: Symphanie, Cederberg MR #: [id number] Study Date: 07/05/2014 Gender: F Age: 89 Height: 165.1 cm Weight: 190.5 kg BSA: 3.09 m^2 Pt. Status: Room:  ATTENDING Pierre Bali, MD ORDERING [name], [facility] [name], Glynis Smiles [place], [place] [phone] PERFORMING [facility], Outpatient SONOGRAPHER Jerel Shepherd  cc:  ------------------------------------------------------------------- LV EF: 45% - 50%  ------------------------------------------------------------------- Indications: V58.11 Chemotherapy Evaluation.  ------------------------------------------------------------------- History: PMH: Palpitations.  ------------------------------------------------------------------- Study Conclusions  - Left ventricle: The cavity size was mildly dilated. Systolic function was mildly reduced. The estimated ejection fraction was in the range of 45% to 50%. 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 Anterior-posterior dimension: 45 mm.  Transthoracic echocardiography. M-mode, complete 2D, spectral Doppler, and color Doppler. Birthdate: Patient birthdate: [birth year]. Age: Patient is 43 yr old. Sex: Gender: female. Height: Height: 165.1 cm. Height: 65 in. Weight: Weight: 190.5 kg. Weight: 419.1 lb. Body mass index: BMI: 69.9 kg/m^2. Body surface area: BSA: 3.09 m^2. Blood pressure: 130/88 Patient status: Inpatient. Study date: Study date: 07/05/2014. Study time: 10:16 AM. Location: Echo  laboratory.  -------------------------------------------------------------------  ------------------------------------------------------------------- Left ventricle: The cavity size was mildly dilated. Systolic function was mildly reduced. The estimated ejection fraction was in the range of 45% to 50%. 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 BRCA-1 positive [place] woman  1. S/p left breast upper outer quadrant biopsy of two separate breast masses and one axillary lymph node 01/29/2014 for a , clinical mT2 N1 stage IIB, invasive ductal carcinoma, grade 2-3,  estrogen and progesterone receptor negative, with an MIB-1 between 79-100%, and HER 2 amplified  2. completed 6 cycles of carboplatin, docetaxel, trastuzumab and pertuzumab 06/30/2014 with MRI 07/07/2014 showing a complete radiologic response  3. trastuzumab to be continued to complete a year (through March 2016); however echocardiogram 07/05/2014 showed an ejection fraction of 45-50%-- trastuzumab being held until EF recovery  4. 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   5. MRSA port infection and septicemia mid-June 2015;  Port was removed, and  PICC line placed, completed antibiotic therapy with ANCEF    6 genetic testing with the [facility] panel and was found to have a pathogenic mutation in the BRCA1 gene called c.190T>G (p.Cys64Gly  7. the patient is planning on bilateral mastectomies with left sentinel lymph node sampling. She may be a candidate for the alliance trial or the B-51 trial.   PLAN:  We spent approximately 50 minutes going over the patient's treatment history and prognosis. She has  obtained a complete radiologic response with her chemotherapy. This correlates well with the chance of obtaining a complete pathologic response at  the upcoming surgery. She understands that patients who do obtain a complete pathologic response have an excellent long-term prognosis.  She is considering bilateral mastectomies. She understands that it is possible for BRCA-positive patients to keep your breast if they are willing to undergo nodal and mammography by bilateral breast MRI on a yearly basis. Of course her chance of developing another breast cancer is very high and that might entail another round of chemotherapy, etc. She is very clear in her mind that she wishes for bilateral mastectomies with reconstruction. She will be discussing the reconstruction aspect with Dr. Migdalia [name].  The protocol is to continue trastuzumab alone to complete a year. However she has had a significant drop in her ejection fraction. We are going to hold off on trastuzumab until that fully recovers. We will maintain the PICC line until she is done with the trastuzumab treatment.  The patient has a good understanding of the overall plan. She agrees with it. She knows the goal of treatment in her case is cure. She will call with any problems that may develop before her next visit here.   [name], MD  07/08/2014 6:10 PM

## 2014-07-08 NOTE — Addendum Note (Signed)
Addended by: Stark Klein on: 07/08/2014 02:06 PM   Modules accepted: Orders

## 2014-07-08 NOTE — Telephone Encounter (Signed)
per pof to sch flush no flush appt avail after 4:00-per GM

## 2014-07-09 ENCOUNTER — Encounter: Payer: Self-pay | Admitting: *Deleted

## 2014-07-09 ENCOUNTER — Telehealth: Payer: Self-pay | Admitting: *Deleted

## 2014-07-09 NOTE — Telephone Encounter (Signed)
Received a fax from [facility] stating that patient claims her MD decreased dosage for Clonidine 0.2 mg. The pharmacy wanted a new prescription sent over.  Informed the pharmacy that patient's PCP did not write the prescription and that the cardiologist Dr. Glori Bickers wrote the prescription. Informed pharmacy to call Dr. Haroldine Laws. Pharmacy verbalized understanding.Vivia Birmingham, RN

## 2014-07-13 ENCOUNTER — Encounter: Payer: Self-pay | Admitting: Nurse Practitioner

## 2014-07-13 ENCOUNTER — Ambulatory Visit: Payer: Medicaid Other

## 2014-07-13 ENCOUNTER — Telehealth: Payer: Self-pay

## 2014-07-13 DIAGNOSIS — C50412 Malignant neoplasm of upper-outer quadrant of left female breast: Secondary | ICD-10-CM

## 2014-07-13 MED ORDER — LIDOCAINE-PRILOCAINE 2.5-2.5 % EX CREA: 1.0000 "application " | TOPICAL_CREAM | CUTANEOUS | Status: DC | PRN

## 2014-07-13 NOTE — Telephone Encounter (Signed)
Told Ms. Maddy that Dr. Jana Hakim will refill her EMLA cream.  The Apresoline needs to be refilled by her PCP or her cardiologist Dr. Haroldine Laws.  Ms. Lacosse verbalized understanding.

## 2014-07-13 NOTE — Progress Notes (Signed)
Called phone on file and no answer. I received letter of support from the patient's aunt.

## 2014-07-13 NOTE — Progress Notes (Signed)
Patient will have her aunt send me support letter. I advised of [employer] grant and what it covers. She has not applied for any asst .

## 2014-07-14 ENCOUNTER — Encounter: Payer: Self-pay | Admitting: Nurse Practitioner

## 2014-07-14 NOTE — Progress Notes (Signed)
Called to let the patient know I recd the email from aunt as letter of support. She wants her rent paid, and I told her I need lease and w-9 from landlord to pay rent for her. She will bring and will sign grant form.

## 2014-07-15 ENCOUNTER — Telehealth: Payer: Self-pay | Admitting: *Deleted

## 2014-07-15 DIAGNOSIS — C50412 Malignant neoplasm of upper-outer quadrant of left female breast: Secondary | ICD-10-CM

## 2014-07-15 NOTE — Telephone Encounter (Signed)
Received call from pt asking for refill on her numbing cream.  This has already been taken care of on 07/13/14.

## 2014-07-18 ENCOUNTER — Other Ambulatory Visit: Payer: Self-pay | Admitting: Oncology

## 2014-07-20 ENCOUNTER — Telehealth (INDEPENDENT_AMBULATORY_CARE_PROVIDER_SITE_OTHER): Payer: Self-pay

## 2014-07-20 NOTE — Telephone Encounter (Signed)
Pt called for her MRI results which were given. No new masses in either breast.  She would like to know if this will change the surgery.  As of now she is scheduled for L mastectomy, SLN bx, possible ALND.  Please call to discuss.

## 2014-07-21 ENCOUNTER — Other Ambulatory Visit (INDEPENDENT_AMBULATORY_CARE_PROVIDER_SITE_OTHER): Payer: Self-pay | Admitting: General Surgery

## 2014-07-21 DIAGNOSIS — C50912 Malignant neoplasm of unspecified site of left female breast: Secondary | ICD-10-CM

## 2014-07-22 ENCOUNTER — Encounter (HOSPITAL_COMMUNITY)
Admission: RE | Admit: 2014-07-22 | Discharge: 2014-07-22 | Disposition: A | Discharge status: Home / Self Care | Payer: Medicaid Other | Source: Ambulatory Visit | Attending: General Surgery | Admitting: General Surgery

## 2014-07-22 ENCOUNTER — Telehealth (INDEPENDENT_AMBULATORY_CARE_PROVIDER_SITE_OTHER): Payer: Self-pay | Admitting: General Surgery

## 2014-07-22 ENCOUNTER — Encounter (HOSPITAL_COMMUNITY): Payer: Self-pay | Admitting: Pharmacy Technician

## 2014-07-22 ENCOUNTER — Encounter (HOSPITAL_COMMUNITY): Payer: Self-pay

## 2014-07-22 ENCOUNTER — Other Ambulatory Visit (HOSPITAL_COMMUNITY): Payer: Self-pay | Admitting: *Deleted

## 2014-07-22 ENCOUNTER — Telehealth (INDEPENDENT_AMBULATORY_CARE_PROVIDER_SITE_OTHER): Payer: Self-pay

## 2014-07-22 DIAGNOSIS — Z01818 Encounter for other preprocedural examination: Secondary | ICD-10-CM | POA: Insufficient documentation

## 2014-07-22 DIAGNOSIS — C50412 Malignant neoplasm of upper-outer quadrant of left female breast: Secondary | ICD-10-CM

## 2014-07-22 DIAGNOSIS — D509 Iron deficiency anemia, unspecified: Secondary | ICD-10-CM

## 2014-07-22 DIAGNOSIS — C50419 Malignant neoplasm of upper-outer quadrant of unspecified female breast: Secondary | ICD-10-CM | POA: Insufficient documentation

## 2014-07-22 HISTORY — DX: Acute embolism and thrombosis of unspecified deep veins of unspecified lower extremity: I82.409

## 2014-07-22 HISTORY — DX: Shortness of breath: R06.02

## 2014-07-22 HISTORY — DX: Personal history of other medical treatment: Z92.89

## 2014-07-22 LAB — BASIC METABOLIC PANEL
Anion gap: 14 (ref 5–15)
BUN: 18 mg/dL (ref 6–23)
CALCIUM: 9.3 mg/dL (ref 8.4–10.5)
CHLORIDE: 105 meq/L (ref 96–112)
CO2: 22 mEq/L (ref 19–32)
CREATININE: 0.82 mg/dL (ref 0.50–1.10)
GFR, EST NON AFRICAN AMERICAN: 88 mL/min — AB (ref >90)
Glucose, Bld: 104 mg/dL — ABNORMAL HIGH (ref 70–99)
Potassium: 3.8 mEq/L (ref 3.7–5.3)
Sodium: 141 mEq/L (ref 137–147)

## 2014-07-22 LAB — CBC
HCT: 33.1 % — ABNORMAL LOW (ref 36.0–46.0)
HEMOGLOBIN: 10.8 g/dL — AB (ref 12.0–15.0)
MCH: 32.9 pg (ref 26.0–34.0)
MCHC: 32.6 g/dL (ref 30.0–36.0)
MCV: 100.9 fL — ABNORMAL HIGH (ref 78.0–100.0)
Platelets: 231 10*3/uL (ref 150–400)
RBC: 3.28 MIL/uL — ABNORMAL LOW (ref 3.87–5.11)
RDW: 16.7 % — ABNORMAL HIGH (ref 11.5–15.5)
WBC: 5.4 10*3/uL (ref 4.0–10.5)

## 2014-07-22 MED ORDER — CARVEDILOL 6.25 MG PO TABS: 6.2500 mg | ORAL_TABLET | Freq: Two times a day (BID) | ORAL | Status: DC

## 2014-07-22 MED ORDER — CLONIDINE HCL 0.2 MG PO TABS: 0.1000 mg | ORAL_TABLET | Freq: Two times a day (BID) | ORAL | Status: DC

## 2014-07-22 MED ORDER — HYDRALAZINE HCL 50 MG PO TABS: 50.0000 mg | ORAL_TABLET | Freq: Three times a day (TID) | ORAL | Status: DC

## 2014-07-22 NOTE — Telephone Encounter (Signed)
Patient calling into office to discuss MRI results also patient would like to know what surgery she's scheduled to have on 07/28/14.  Patient states she does not have any lymph nodes in her left axilla.  Patient will be at home between 4-4:30 today and would like a call back at [phone].

## 2014-07-22 NOTE — Telephone Encounter (Signed)
Left message with mother for patient to call in AM to discuss surgery.

## 2014-07-22 NOTE — Pre-Procedure Instructions (Addendum)
Tamara [name]  07/22/2014   Your procedure is scheduled on:  Wednesday, August 26.  Report to [facility] Admitting at 9:00am AM.  Call this number if you have problems the morning of surgery: [phone]   Remember:   Do not eat food or drink liquids after midnight Tuesday, August 25.   Take these medicines the morning of surgery with A SIP OF WATER: carvedilol (COREG), cloNIDine (CATAPRES), hydrALAZINE (APRESOLINE).                Take if needed: oxyCODONE-acetaminophen (PERCOCET/ROXICET), promethazine (PHENERGAN).               Stop taking Aspirin, Coumadin, Plavix, Effient and Herbal medications.  Do not take any NSAIDs ie: Ibuprofen,  Advil,Naproxen or any medication containing Aspirin.   Do not wear jewelry, make-up or nail polish.  Do not wear lotions, powders, or perfumes.   Do not shave 48 hours prior to surgery.   Do not bring valuables to the hospital.             [facility] is not responsible for any belongings or valuables.               Contacts, dentures or bridgework may not be worn into surgery.  Leave suitcase in the car. After surgery it may be brought to your room.  For patients admitted to the hospital, discharge time is determined by your treatment team.               Patients discharged the day of surgery will not be allowed to drive home.  Name and phone number of your driver:-   Special Instructions: Review  [facility] - Preparing For Surgery.   Please read over the following fact sheets that you were given: Pain Booklet, Coughing and Deep Breathing and Surgical Site Infection Prevention

## 2014-07-22 NOTE — Progress Notes (Addendum)
Tamara [name] reports that she has not had  Her BP medication today.  I rechecked BP in Right arm and leg and the pressure were still elevated.  Right arm 155/106, Right leg 171/107. "My pressure is always high in my right arm due to DVT."  I instructed patient to make sure she takes her medication as ordered and take the meds she was instructed to the day of surgery.

## 2014-07-23 ENCOUNTER — Telehealth (INDEPENDENT_AMBULATORY_CARE_PROVIDER_SITE_OTHER): Payer: Self-pay | Admitting: *Deleted

## 2014-07-23 NOTE — Telephone Encounter (Signed)
Beth from the lab at [facility] called to let Dr. Barry Dienes know that they did not draw the serum pregnancy blood work and that Dr. Barry Dienes will have to get this drawn before her surgery.   Please advise!  Tamara [name]

## 2014-07-23 NOTE — Telephone Encounter (Signed)
Discussed surgery with patient.  Will plan bilateral mastectomies, left SLN bx, possible ALND.  Dr. Migdalia Dk to do bilateral reconstructions.

## 2014-07-26 ENCOUNTER — Other Ambulatory Visit: Payer: Self-pay | Admitting: Plastic Surgery

## 2014-07-26 DIAGNOSIS — C50919 Malignant neoplasm of unspecified site of unspecified female breast: Secondary | ICD-10-CM

## 2014-07-27 ENCOUNTER — Ambulatory Visit (HOSPITAL_BASED_OUTPATIENT_CLINIC_OR_DEPARTMENT_OTHER): Payer: Medicaid Other

## 2014-07-27 ENCOUNTER — Telehealth: Payer: Self-pay | Admitting: Oncology

## 2014-07-27 DIAGNOSIS — Z452 Encounter for adjustment and management of vascular access device: Secondary | ICD-10-CM

## 2014-07-27 DIAGNOSIS — Z95828 Presence of other vascular implants and grafts: Secondary | ICD-10-CM

## 2014-07-27 DIAGNOSIS — C50419 Malignant neoplasm of upper-outer quadrant of unspecified female breast: Secondary | ICD-10-CM

## 2014-07-27 DIAGNOSIS — C773 Secondary and unspecified malignant neoplasm of axilla and upper limb lymph nodes: Secondary | ICD-10-CM

## 2014-07-27 MED ORDER — HEPARIN SOD (PORK) LOCK FLUSH 100 UNIT/ML IV SOLN
500.0000 [IU] | Freq: Once | INTRAVENOUS | Status: AC
  Administered 2014-07-27: 250 [IU] via INTRAVENOUS
  Filled 2014-07-27: qty 5

## 2014-07-27 MED ORDER — SODIUM CHLORIDE 0.9 % IJ SOLN
10.0000 mL | INTRAMUSCULAR | Status: DC | PRN
  Administered 2014-07-27: 10 mL via INTRAVENOUS
  Filled 2014-07-27: qty 10

## 2014-07-27 NOTE — Anesthesia Preprocedure Evaluation (Addendum)
Anesthesia Evaluation   Patient awake    Reviewed: Allergy & Precautions, H&P , NPO status , Patient's Chart, lab work & pertinent test results, reviewed documented beta blocker date and time   History of Anesthesia Complications (+) history of anesthetic complications  Airway Mallampati: I TM Distance: >3 FB Neck ROM: Full    Dental  (+) Teeth Intact, Dental Advisory Given,    Pulmonary shortness of breath, former smoker,  breath sounds clear to auscultation        Cardiovascular hypertension, Pt. on medications and Pt. on home beta blockers Rhythm:Regular  EF 54 - 50 % resently   Neuro/Psych    GI/Hepatic   Endo/Other    Renal/GU Renal InsufficiencyRenal disease     Musculoskeletal   Abdominal (+)  Abdomen: soft. Bowel sounds: normal.  Peds  Hematology  (+) anemia , HCT 33%   Anesthesia Other Findings HCT 33%  Reproductive/Obstetrics                       Anesthesia Physical Anesthesia Plan  ASA: II  Anesthesia Plan: General   Post-op Pain Management:    Induction: Intravenous  Airway Management Planned: Oral ETT  Additional Equipment:   Intra-op Plan:   Post-operative Plan: Extubation in OR  Informed Consent: I have reviewed the patients History and Physical, chart, labs and discussed the procedure including the risks, benefits and alternatives for the proposed anesthesia with the patient or authorized representative who has indicated his/her understanding and acceptance.     Plan Discussed with:   Anesthesia Plan Comments:         Anesthesia Quick Evaluation

## 2014-07-27 NOTE — Patient Instructions (Signed)
PICC Home Guide A peripherally inserted central catheter (PICC) is a long, thin, flexible tube that is inserted into a vein in the upper arm. It is a form of intravenous (IV) access. It is considered to be a "central" line because the tip of the PICC ends in a large vein in your chest. This large vein is called the superior vena cava (SVC). The PICC tip ends in the SVC because there is a lot of blood flow in the SVC. This allows medicines and IV fluids to be quickly distributed throughout the body. The PICC is inserted using a sterile technique by a specially trained nurse or physician. After the PICC is inserted, a chest X-ray exam is done to be sure it is in the correct place.  A PICC may be placed for different reasons, such as:  To give medicines and liquid nutrition that can only be given through a central line. Examples are:  Certain antibiotic treatments.  Chemotherapy.  Total parenteral nutrition (TPN).  To take frequent blood samples.  To give IV fluids and blood products.  If there is difficulty placing a peripheral intravenous (PIV) catheter. If taken care of properly, a PICC can remain in place for several months. A PICC can also allow a person to go home from the hospital early. Medicine and PICC care can be managed at home by a family member or home health care team. WHAT PROBLEMS CAN HAPPEN WHEN I HAVE A PICC? Problems with a PICC can occasionally occur. These may include the following:  A blood clot (thrombus) forming in or at the tip of the PICC. This can cause the PICC to become clogged. A clot-dissolving medicine called tissue plasminogen activator (tPA) can be given through the PICC to help break up the clot.  Inflammation of the vein (phlebitis) in which the PICC is placed. Signs of inflammation may include redness, pain at the insertion site, red streaks, or being able to feel a "cord" in the vein where the PICC is located.  Infection in the PICC or at the insertion  site. Signs of infection may include fever, chills, redness, swelling, or pus drainage from the PICC insertion site.  PICC movement (malposition). The PICC tip may move from its original position due to excessive physical activity, forceful coughing, sneezing, or vomiting.  A break or cut in the PICC. It is important to not use scissors near the PICC.  Nerve or tendon irritation or injury during PICC insertion. WHAT SHOULD I KEEP IN MIND ABOUT ACTIVITIES WHEN I HAVE A PICC?  You may bend your arm and move it freely. If your PICC is near or at the bend of your elbow, avoid activity with repeated motion at the elbow.  Rest at home for the remainder of the day following PICC line insertion.  Avoid lifting heavy objects as instructed by your health care provider.  Avoid using a crutch with the arm on the same side as your PICC. You may need to use a walker. WHAT SHOULD I KNOW ABOUT MY PICC DRESSING?  Keep your PICC bandage (dressing) clean and dry to prevent infection.  Ask your health care provider when you may shower. Ask your health care provider to teach you how to wrap the PICC when you do take a shower.  Change the PICC dressing as instructed by your health care provider.  Change your PICC dressing if it becomes loose or wet. WHAT SHOULD I KNOW ABOUT PICC CARE?  Check the PICC insertion site   daily for leakage, redness, swelling, or pain.  Do not take a bath, swim, or use hot tubs when you have a PICC. Cover PICC line with clear plastic wrap and tape to keep it dry while showering.  Flush the PICC as directed by your health care provider. Let your health care provider know right away if the PICC is difficult to flush or does not flush. Do not use force to flush the PICC.  Do not use a syringe that is less than 10 mL to flush the PICC.  Never pull or tug on the PICC.  Avoid blood pressure checks on the arm with the PICC.  Keep your PICC identification card with you at all  times.  Do not take the PICC out yourself. Only a trained clinical professional should remove the PICC. SEEK IMMEDIATE MEDICAL CARE IF:  Your PICC is accidentally pulled all the way out. If this happens, cover the insertion site with a bandage or gauze dressing. Do not throw the PICC away. Your health care provider will need to inspect it.  Your PICC was tugged or pulled and has partially come out. Do not  push the PICC back in.  There is any type of drainage, redness, or swelling where the PICC enters the skin.  You cannot flush the PICC, it is difficult to flush, or the PICC leaks around the insertion site when it is flushed.  You hear a "flushing" sound when the PICC is flushed.  You have pain, discomfort, or numbness in your arm, shoulder, or jaw on the same side as the PICC.  You feel your heart "racing" or skipping beats.  You notice a hole or tear in the PICC.  You develop chills or a fever. MAKE SURE YOU:   Understand these instructions.  Will watch your condition.  Will get help right away if you are not doing well or get worse. Document Released: 05/26/2003 Document Revised: 04/05/2014 Document Reviewed: 07/27/2013 [employer] Patient Information 2015 [employer]. This information is not intended to replace advice given to you by your health care provider. Make sure you discuss any questions you have with your health care provider.  

## 2014-07-27 NOTE — Telephone Encounter (Signed)
pt came in to have dressing chge/pic line flush-pt having surgery-sch-sent to registration flush room can see pt

## 2014-07-27 NOTE — Progress Notes (Deleted)
Picc dressing and flush

## 2014-07-28 ENCOUNTER — Ambulatory Visit (HOSPITAL_COMMUNITY): Payer: Medicaid Other | Admitting: Anesthesiology

## 2014-07-28 ENCOUNTER — Inpatient Hospital Stay (HOSPITAL_COMMUNITY)
Admission: RE | Admit: 2014-07-28 | Discharge: 2014-07-31 | DRG: 581 | Disposition: A | Discharge status: Home / Self Care | Payer: Medicaid Other | Source: Ambulatory Visit | Attending: Plastic Surgery | Admitting: Plastic Surgery

## 2014-07-28 ENCOUNTER — Ambulatory Visit (HOSPITAL_COMMUNITY)
Admission: RE | Admit: 2014-07-28 | Discharge: 2014-07-28 | Disposition: A | Discharge status: Home / Self Care | Payer: Medicaid Other | Source: Ambulatory Visit | Attending: General Surgery | Admitting: General Surgery

## 2014-07-28 ENCOUNTER — Encounter (HOSPITAL_COMMUNITY): Payer: Medicaid Other | Admitting: Anesthesiology

## 2014-07-28 ENCOUNTER — Encounter (HOSPITAL_COMMUNITY): Payer: Self-pay | Admitting: *Deleted

## 2014-07-28 ENCOUNTER — Encounter (HOSPITAL_COMMUNITY)
Admission: RE | Disposition: A | Discharge status: Home / Self Care | Payer: Self-pay | Source: Ambulatory Visit | Attending: Plastic Surgery

## 2014-07-28 ENCOUNTER — Encounter: Payer: Self-pay | Admitting: *Deleted

## 2014-07-28 DIAGNOSIS — C50919 Malignant neoplasm of unspecified site of unspecified female breast: Secondary | ICD-10-CM | POA: Diagnosis present

## 2014-07-28 DIAGNOSIS — C50912 Malignant neoplasm of unspecified site of left female breast: Secondary | ICD-10-CM

## 2014-07-28 DIAGNOSIS — Z79899 Other long term (current) drug therapy: Secondary | ICD-10-CM | POA: Diagnosis not present

## 2014-07-28 DIAGNOSIS — Z9221 Personal history of antineoplastic chemotherapy: Secondary | ICD-10-CM | POA: Diagnosis not present

## 2014-07-28 DIAGNOSIS — Z803 Family history of malignant neoplasm of breast: Secondary | ICD-10-CM

## 2014-07-28 DIAGNOSIS — C50419 Malignant neoplasm of upper-outer quadrant of unspecified female breast: Secondary | ICD-10-CM

## 2014-07-28 DIAGNOSIS — N6019 Diffuse cystic mastopathy of unspecified breast: Secondary | ICD-10-CM

## 2014-07-28 DIAGNOSIS — Z87891 Personal history of nicotine dependence: Secondary | ICD-10-CM | POA: Diagnosis not present

## 2014-07-28 DIAGNOSIS — D509 Iron deficiency anemia, unspecified: Secondary | ICD-10-CM | POA: Diagnosis present

## 2014-07-28 DIAGNOSIS — I10 Essential (primary) hypertension: Secondary | ICD-10-CM | POA: Diagnosis present

## 2014-07-28 DIAGNOSIS — N809 Endometriosis, unspecified: Secondary | ICD-10-CM | POA: Diagnosis present

## 2014-07-28 DIAGNOSIS — Z853 Personal history of malignant neoplasm of breast: Secondary | ICD-10-CM

## 2014-07-28 DIAGNOSIS — Z1501 Genetic susceptibility to malignant neoplasm of breast: Secondary | ICD-10-CM

## 2014-07-28 HISTORY — PX: BREAST RECONSTRUCTION WITH PLACEMENT OF TISSUE EXPANDER AND FLEX HD (ACELLULAR HYDRATED DERMIS): SHX6295

## 2014-07-28 HISTORY — PX: BILATERAL TOTAL MASTECTOMY WITH AXILLARY LYMPH NODE DISSECTION: SHX6364

## 2014-07-28 LAB — BASIC METABOLIC PANEL
ANION GAP: 12 (ref 5–15)
BUN: 13 mg/dL (ref 6–23)
CHLORIDE: 102 meq/L (ref 96–112)
CO2: 25 mEq/L (ref 19–32)
CREATININE: 0.77 mg/dL (ref 0.50–1.10)
Calcium: 8.9 mg/dL (ref 8.4–10.5)
GFR calc Af Amer: >90 mL/min (ref >90)
GFR calc non Af Amer: >90 mL/min (ref >90)
Glucose, Bld: 136 mg/dL — ABNORMAL HIGH (ref 70–99)
POTASSIUM: 4.8 meq/L (ref 3.7–5.3)
Sodium: 139 mEq/L (ref 137–147)

## 2014-07-28 LAB — CBC
HCT: 31.3 % — ABNORMAL LOW (ref 36.0–46.0)
HEMOGLOBIN: 10.4 g/dL — AB (ref 12.0–15.0)
MCH: 33.2 pg (ref 26.0–34.0)
MCHC: 33.2 g/dL (ref 30.0–36.0)
MCV: 100 fL (ref 78.0–100.0)
Platelets: 267 10*3/uL (ref 150–400)
RBC: 3.13 MIL/uL — AB (ref 3.87–5.11)
RDW: 15.7 % — ABNORMAL HIGH (ref 11.5–15.5)
WBC: 11.2 10*3/uL — ABNORMAL HIGH (ref 4.0–10.5)

## 2014-07-28 SURGERY — BILATERAL TOTAL MASTECTOMY WITH AXILLARY LYMPH NODE DISSECTION
Anesthesia: General | Site: Breast | Laterality: Bilateral

## 2014-07-28 MED ORDER — METHYLENE BLUE 1 % INJ SOLN
INTRAMUSCULAR | Status: AC
  Filled 2014-07-28: qty 10

## 2014-07-28 MED ORDER — MEPERIDINE HCL 25 MG/ML IJ SOLN: 6.2500 mg | INTRAMUSCULAR | Status: DC | PRN

## 2014-07-28 MED ORDER — ROCURONIUM BROMIDE 100 MG/10ML IV SOLN
INTRAVENOUS | Status: DC | PRN
  Administered 2014-07-28: 50 mg via INTRAVENOUS
  Administered 2014-07-28: 30 mg via INTRAVENOUS
  Administered 2014-07-28: 20 mg via INTRAVENOUS

## 2014-07-28 MED ORDER — GLYCOPYRROLATE 0.2 MG/ML IJ SOLN
INTRAMUSCULAR | Status: DC | PRN
  Administered 2014-07-28: 0.6 mg via INTRAVENOUS

## 2014-07-28 MED ORDER — ROCURONIUM BROMIDE 50 MG/5ML IV SOLN
INTRAVENOUS | Status: AC
  Filled 2014-07-28: qty 1

## 2014-07-28 MED ORDER — TECHNETIUM TC 99M SULFUR COLLOID FILTERED
1.0000 | Freq: Once | INTRAVENOUS | Status: AC | PRN
  Administered 2014-07-28: 1 via INTRADERMAL

## 2014-07-28 MED ORDER — ACETAMINOPHEN 325 MG PO TABS
650.0000 mg | ORAL_TABLET | Freq: Four times a day (QID) | ORAL | Status: DC | PRN
  Administered 2014-07-30: 650 mg via ORAL
  Filled 2014-07-28: qty 2

## 2014-07-28 MED ORDER — LACTATED RINGERS IV SOLN
INTRAVENOUS | Status: DC | PRN
  Administered 2014-07-28 (×2): via INTRAVENOUS

## 2014-07-28 MED ORDER — SODIUM CHLORIDE 0.9 % IJ SOLN
INTRAMUSCULAR | Status: DC | PRN
  Administered 2014-07-28: 140 mL

## 2014-07-28 MED ORDER — MIDAZOLAM HCL 2 MG/2ML IJ SOLN
INTRAMUSCULAR | Status: AC
  Administered 2014-07-28: 2 mg
  Filled 2014-07-28: qty 2

## 2014-07-28 MED ORDER — FENTANYL CITRATE 0.05 MG/ML IJ SOLN
INTRAMUSCULAR | Status: AC
  Filled 2014-07-28: qty 5

## 2014-07-28 MED ORDER — FENTANYL CITRATE 0.05 MG/ML IJ SOLN
INTRAMUSCULAR | Status: DC | PRN
  Administered 2014-07-28: 25 ug via INTRAVENOUS
  Administered 2014-07-28: 50 ug via INTRAVENOUS
  Administered 2014-07-28: 100 ug via INTRAVENOUS
  Administered 2014-07-28: 50 ug via INTRAVENOUS
  Administered 2014-07-28: 100 ug via INTRAVENOUS
  Administered 2014-07-28: 50 ug via INTRAVENOUS
  Administered 2014-07-28: 150 ug via INTRAVENOUS

## 2014-07-28 MED ORDER — FENTANYL CITRATE 0.05 MG/ML IJ SOLN
INTRAMUSCULAR | Status: AC
  Filled 2014-07-28: qty 2

## 2014-07-28 MED ORDER — SODIUM CHLORIDE 0.9 % IJ SOLN
10.0000 mL | Freq: Two times a day (BID) | INTRAMUSCULAR | Status: DC
  Administered 2014-07-28: 10 mL

## 2014-07-28 MED ORDER — SCOPOLAMINE 1 MG/3DAYS TD PT72
1.0000 | MEDICATED_PATCH | TRANSDERMAL | Status: DC
  Administered 2014-07-28: 1.5 mg via TRANSDERMAL
  Filled 2014-07-28: qty 1

## 2014-07-28 MED ORDER — SODIUM CHLORIDE 0.9 % IJ SOLN
INTRAMUSCULAR | Status: AC
  Filled 2014-07-28: qty 10

## 2014-07-28 MED ORDER — PANTOPRAZOLE SODIUM 40 MG PO TBEC
40.0000 mg | DELAYED_RELEASE_TABLET | Freq: Every day | ORAL | Status: DC
  Administered 2014-07-28 – 2014-07-31 (×4): 40 mg via ORAL
  Filled 2014-07-28 (×4): qty 1

## 2014-07-28 MED ORDER — MIDAZOLAM HCL 5 MG/5ML IJ SOLN
INTRAMUSCULAR | Status: DC | PRN
  Administered 2014-07-28: 2 mg via INTRAVENOUS

## 2014-07-28 MED ORDER — DIAZEPAM 2 MG PO TABS
2.0000 mg | ORAL_TABLET | Freq: Two times a day (BID) | ORAL | Status: DC | PRN
  Administered 2014-07-30 – 2014-07-31 (×2): 2 mg via ORAL
  Filled 2014-07-28 (×2): qty 1

## 2014-07-28 MED ORDER — LIDOCAINE HCL (CARDIAC) 20 MG/ML IV SOLN
INTRAVENOUS | Status: DC | PRN
  Administered 2014-07-28: 80 mg via INTRAVENOUS

## 2014-07-28 MED ORDER — FENTANYL CITRATE 0.05 MG/ML IJ SOLN
25.0000 ug | INTRAMUSCULAR | Status: DC | PRN
  Administered 2014-07-28: 0.5 ug via INTRAVENOUS
  Administered 2014-07-28 (×2): 50 ug via INTRAVENOUS

## 2014-07-28 MED ORDER — FENTANYL CITRATE 0.05 MG/ML IJ SOLN
100.0000 ug | Freq: Once | INTRAMUSCULAR | Status: AC
  Administered 2014-07-28: 100 ug via INTRAVENOUS

## 2014-07-28 MED ORDER — HYDRALAZINE HCL 50 MG PO TABS
50.0000 mg | ORAL_TABLET | Freq: Three times a day (TID) | ORAL | Status: DC
  Administered 2014-07-28 – 2014-07-31 (×5): 50 mg via ORAL
  Filled 2014-07-28: qty 2
  Filled 2014-07-28 (×5): qty 1
  Filled 2014-07-28 (×3): qty 2
  Filled 2014-07-28 (×4): qty 1

## 2014-07-28 MED ORDER — KCL IN DEXTROSE-NACL 20-5-0.45 MEQ/L-%-% IV SOLN
INTRAVENOUS | Status: DC
  Administered 2014-07-28 – 2014-07-30 (×5): via INTRAVENOUS
  Filled 2014-07-28 (×6): qty 1000

## 2014-07-28 MED ORDER — MIDAZOLAM HCL 2 MG/2ML IJ SOLN
INTRAMUSCULAR | Status: AC
  Filled 2014-07-28: qty 2

## 2014-07-28 MED ORDER — METOPROLOL TARTRATE 1 MG/ML IV SOLN
INTRAVENOUS | Status: DC | PRN
  Administered 2014-07-28: 2.5 mg via INTRAVENOUS

## 2014-07-28 MED ORDER — ACETAMINOPHEN 10 MG/ML IV SOLN
INTRAVENOUS | Status: DC | PRN
  Administered 2014-07-28: 1000 mg via INTRAVENOUS

## 2014-07-28 MED ORDER — OXYCODONE HCL ER 10 MG PO T12A
20.0000 mg | EXTENDED_RELEASE_TABLET | Freq: Two times a day (BID) | ORAL | Status: DC
  Administered 2014-07-28 – 2014-07-31 (×6): 20 mg via ORAL
  Filled 2014-07-28 (×6): qty 2

## 2014-07-28 MED ORDER — LORATADINE 10 MG PO TABS: 10.0000 mg | ORAL_TABLET | Freq: Every day | ORAL | Status: DC | PRN

## 2014-07-28 MED ORDER — ONDANSETRON HCL 4 MG/2ML IJ SOLN: 4.0000 mg | Freq: Four times a day (QID) | INTRAMUSCULAR | Status: DC | PRN

## 2014-07-28 MED ORDER — PHENYLEPHRINE 40 MCG/ML (10ML) SYRINGE FOR IV PUSH (FOR BLOOD PRESSURE SUPPORT)
PREFILLED_SYRINGE | INTRAVENOUS | Status: AC
  Filled 2014-07-28: qty 10

## 2014-07-28 MED ORDER — DEXAMETHASONE SODIUM PHOSPHATE 10 MG/ML IJ SOLN
INTRAMUSCULAR | Status: DC | PRN
  Administered 2014-07-28: 8 mg via INTRAVENOUS

## 2014-07-28 MED ORDER — HYDROCODONE-ACETAMINOPHEN 5-325 MG PO TABS
1.0000 | ORAL_TABLET | ORAL | Status: DC | PRN
  Administered 2014-07-28 – 2014-07-30 (×6): 2 via ORAL
  Filled 2014-07-28 (×6): qty 2

## 2014-07-28 MED ORDER — NEOSTIGMINE METHYLSULFATE 10 MG/10ML IV SOLN
INTRAVENOUS | Status: DC | PRN
  Administered 2014-07-28: 4 mg via INTRAVENOUS

## 2014-07-28 MED ORDER — SODIUM CHLORIDE 0.9 % IJ SOLN
INTRAMUSCULAR | Status: DC | PRN
  Administered 2014-07-28: 13:00:00 via INTRAMUSCULAR

## 2014-07-28 MED ORDER — CEFAZOLIN SODIUM-DEXTROSE 2-3 GM-% IV SOLR
INTRAVENOUS | Status: AC
  Filled 2014-07-28: qty 50

## 2014-07-28 MED ORDER — ENOXAPARIN SODIUM 100 MG/ML ~~LOC~~ SOLN
95.0000 mg | Freq: Two times a day (BID) | SUBCUTANEOUS | Status: DC
  Administered 2014-07-29 – 2014-07-31 (×5): 95 mg via SUBCUTANEOUS
  Filled 2014-07-28 (×7): qty 1

## 2014-07-28 MED ORDER — CARVEDILOL 6.25 MG PO TABS
6.2500 mg | ORAL_TABLET | Freq: Two times a day (BID) | ORAL | Status: DC
  Administered 2014-07-28 – 2014-07-31 (×5): 6.25 mg via ORAL
  Filled 2014-07-28 (×9): qty 1

## 2014-07-28 MED ORDER — SODIUM CHLORIDE 0.9 % IJ SOLN
10.0000 mL | INTRAMUSCULAR | Status: DC | PRN
  Administered 2014-07-28 – 2014-07-29 (×3): 10 mL
  Administered 2014-07-30 – 2014-07-31 (×2): 20 mL

## 2014-07-28 MED ORDER — PROPOFOL 10 MG/ML IV BOLUS
INTRAVENOUS | Status: AC
  Filled 2014-07-28: qty 20

## 2014-07-28 MED ORDER — DEXMEDETOMIDINE HCL 200 MCG/2ML IV SOLN
INTRAVENOUS | Status: DC | PRN
  Administered 2014-07-28: 10 ug via INTRAVENOUS
  Administered 2014-07-28 (×7): 8 ug via INTRAVENOUS

## 2014-07-28 MED ORDER — CLONIDINE HCL 0.1 MG PO TABS
0.1000 mg | ORAL_TABLET | Freq: Two times a day (BID) | ORAL | Status: DC
  Administered 2014-07-28 – 2014-07-31 (×3): 0.1 mg via ORAL
  Filled 2014-07-28 (×7): qty 1

## 2014-07-28 MED ORDER — MIDAZOLAM HCL 2 MG/2ML IJ SOLN
2.0000 mg | Freq: Once | INTRAMUSCULAR | Status: DC
  Filled 2014-07-28: qty 2

## 2014-07-28 MED ORDER — SODIUM CHLORIDE 0.9 % IR SOLN
Status: DC | PRN
  Administered 2014-07-28: 1

## 2014-07-28 MED ORDER — DOCUSATE SODIUM 100 MG PO CAPS
100.0000 mg | ORAL_CAPSULE | Freq: Two times a day (BID) | ORAL | Status: DC
  Administered 2014-07-28 – 2014-07-31 (×6): 100 mg via ORAL
  Filled 2014-07-28 (×6): qty 1

## 2014-07-28 MED ORDER — PROPOFOL 10 MG/ML IV BOLUS
INTRAVENOUS | Status: DC | PRN
  Administered 2014-07-28: 120 mg via INTRAVENOUS

## 2014-07-28 MED ORDER — CEFAZOLIN SODIUM 1-5 GM-% IV SOLN
1.0000 g | Freq: Three times a day (TID) | INTRAVENOUS | Status: DC
  Administered 2014-07-28 – 2014-07-30 (×5): 1 g via INTRAVENOUS
  Filled 2014-07-28 (×7): qty 50

## 2014-07-28 MED ORDER — FERROUS SULFATE 325 (65 FE) MG PO TABS
325.0000 mg | ORAL_TABLET | Freq: Three times a day (TID) | ORAL | Status: DC
  Administered 2014-07-28 – 2014-07-31 (×9): 325 mg via ORAL
  Filled 2014-07-28 (×13): qty 1

## 2014-07-28 MED ORDER — LISINOPRIL 40 MG PO TABS
40.0000 mg | ORAL_TABLET | Freq: Every day | ORAL | Status: DC
  Administered 2014-07-28 – 2014-07-31 (×2): 40 mg via ORAL
  Filled 2014-07-28: qty 1
  Filled 2014-07-28: qty 2
  Filled 2014-07-28 (×2): qty 1
  Filled 2014-07-28: qty 2
  Filled 2014-07-28: qty 1

## 2014-07-28 MED ORDER — SODIUM CHLORIDE 0.9 % IR SOLN
Status: DC | PRN
  Administered 2014-07-28: 13:00:00

## 2014-07-28 MED ORDER — BUPIVACAINE HCL (PF) 0.25 % IJ SOLN
INTRAMUSCULAR | Status: DC | PRN
  Administered 2014-07-28: 60 mL

## 2014-07-28 MED ORDER — LACTATED RINGERS IV SOLN
INTRAVENOUS | Status: DC
  Administered 2014-07-28: 10:00:00 via INTRAVENOUS

## 2014-07-28 MED ORDER — HYDROMORPHONE HCL PF 1 MG/ML IJ SOLN
1.0000 mg | INTRAMUSCULAR | Status: DC | PRN
  Administered 2014-07-28 – 2014-07-30 (×9): 1 mg via INTRAVENOUS
  Filled 2014-07-28 (×9): qty 1

## 2014-07-28 MED ORDER — CEFAZOLIN SODIUM-DEXTROSE 2-3 GM-% IV SOLR
2.0000 g | INTRAVENOUS | Status: AC
  Administered 2014-07-28 (×2): 2 g via INTRAVENOUS
  Filled 2014-07-28: qty 50

## 2014-07-28 MED ORDER — ACETAMINOPHEN 650 MG RE SUPP: 650.0000 mg | Freq: Four times a day (QID) | RECTAL | Status: DC | PRN

## 2014-07-28 MED ORDER — SUCCINYLCHOLINE CHLORIDE 20 MG/ML IJ SOLN
INTRAMUSCULAR | Status: AC
  Filled 2014-07-28: qty 1

## 2014-07-28 MED ORDER — BUPIVACAINE HCL (PF) 0.25 % IJ SOLN
INTRAMUSCULAR | Status: AC
  Filled 2014-07-28: qty 60

## 2014-07-28 MED ORDER — ACETAMINOPHEN 10 MG/ML IV SOLN
INTRAVENOUS | Status: AC
  Filled 2014-07-28: qty 100

## 2014-07-28 MED ORDER — ONDANSETRON HCL 4 MG/2ML IJ SOLN
INTRAMUSCULAR | Status: DC | PRN
  Administered 2014-07-28 (×2): 4 mg via INTRAVENOUS

## 2014-07-28 SURGICAL SUPPLY — 91 items
BAG DECANTER FOR FLEXI CONT (MISCELLANEOUS) ×3 IMPLANT
BINDER BREAST LRG (GAUZE/BANDAGES/DRESSINGS) IMPLANT
BINDER BREAST XLRG (GAUZE/BANDAGES/DRESSINGS) ×6 IMPLANT
BIOPATCH RED 1 DISK 7.0 (GAUZE/BANDAGES/DRESSINGS) ×4 IMPLANT
BIOPATCH RED 1IN DISK 7.0MM (GAUZE/BANDAGES/DRESSINGS) ×2
BLADE 10 SAFETY STRL DISP (BLADE) ×3 IMPLANT
CANISTER SUCTION 2500CC (MISCELLANEOUS) ×3 IMPLANT
CHLORAPREP W/TINT 26ML (MISCELLANEOUS) ×3 IMPLANT
CLIP TI LARGE 6 (CLIP) ×3 IMPLANT
CLIP TI MEDIUM 24 (CLIP) ×3 IMPLANT
CLIP TI WIDE RED SMALL 24 (CLIP) ×12 IMPLANT
CONT SPEC 4OZ CLIKSEAL STRL BL (MISCELLANEOUS) ×9 IMPLANT
COVER PROBE W GEL 5X96 (DRAPES) ×3 IMPLANT
COVER SURGICAL LIGHT HANDLE (MISCELLANEOUS) ×9 IMPLANT
DERMABOND ADVANCED (GAUZE/BANDAGES/DRESSINGS) ×6
DERMABOND ADVANCED .7 DNX12 (GAUZE/BANDAGES/DRESSINGS) ×3 IMPLANT
DEVICE DISSECT PLASMABLAD 3.0S (MISCELLANEOUS) ×3 IMPLANT
DRAIN CHANNEL 19F RND (DRAIN) ×6 IMPLANT
DRAPE INCISE IOBAN 66X45 STRL (DRAPES) ×3 IMPLANT
DRAPE ORTHO SPLIT 77X108 STRL (DRAPES) ×8
DRAPE PROXIMA HALF (DRAPES) ×6 IMPLANT
DRAPE SURG 17X23 STRL (DRAPES) IMPLANT
DRAPE SURG ORHT 6 SPLT 77X108 (DRAPES) ×4 IMPLANT
DRAPE WARM FLUID 44X44 (DRAPE) ×3 IMPLANT
DRSG MEPILEX BORDER 4X12 (GAUZE/BANDAGES/DRESSINGS) IMPLANT
DRSG PAD ABDOMINAL 8X10 ST (GAUZE/BANDAGES/DRESSINGS) ×6 IMPLANT
ELECT BLADE 4.0 EZ CLEAN MEGAD (MISCELLANEOUS) ×3
ELECT CAUTERY BLADE 6.4 (BLADE) ×6 IMPLANT
ELECT REM PT RETURN 9FT ADLT (ELECTROSURGICAL) ×3
ELECTRODE BLDE 4.0 EZ CLN MEGD (MISCELLANEOUS) ×1 IMPLANT
ELECTRODE REM PT RTRN 9FT ADLT (ELECTROSURGICAL) ×1 IMPLANT
EVACUATOR SILICONE 100CC (DRAIN) ×6 IMPLANT
GAUZE SPONGE 4X4 12PLY STRL (GAUZE/BANDAGES/DRESSINGS) IMPLANT
GLOVE BIO SURGEON STRL SZ 6 (GLOVE) ×3 IMPLANT
GLOVE BIO SURGEON STRL SZ 6.5 (GLOVE) ×12 IMPLANT
GLOVE BIO SURGEON STRL SZ7 (GLOVE) ×6 IMPLANT
GLOVE BIO SURGEONS STRL SZ 6.5 (GLOVE) ×6
GLOVE BIOGEL PI IND STRL 6.5 (GLOVE) ×2 IMPLANT
GLOVE BIOGEL PI IND STRL 7.0 (GLOVE) ×3 IMPLANT
GLOVE BIOGEL PI IND STRL 7.5 (GLOVE) ×1 IMPLANT
GLOVE BIOGEL PI INDICATOR 6.5 (GLOVE) ×4
GLOVE BIOGEL PI INDICATOR 7.0 (GLOVE) ×6
GLOVE BIOGEL PI INDICATOR 7.5 (GLOVE) ×2
GLOVE ECLIPSE 7.5 STRL STRAW (GLOVE) ×6 IMPLANT
GLOVE SURG SS PI 7.0 STRL IVOR (GLOVE) ×18 IMPLANT
GOWN STRL REUS W/ TWL LRG LVL3 (GOWN DISPOSABLE) ×5 IMPLANT
GOWN STRL REUS W/TWL LRG LVL3 (GOWN DISPOSABLE) ×10
GRAFT FLEX HD 4X16 THICK (Tissue Mesh) ×6 IMPLANT
GRAFT FLEX HD BILAT 4X16 THICK (Tissue Mesh) ×3 IMPLANT
IMPL BREAST TIS EXP M 350CC (Breast) ×2 IMPLANT
IMPLANT BREAST TIS EXP M 350CC (Breast) ×6 IMPLANT
KIT BASIN OR (CUSTOM PROCEDURE TRAY) ×6 IMPLANT
KIT ROOM TURNOVER OR (KITS) ×3 IMPLANT
MARKER SKIN DUAL TIP RULER LAB (MISCELLANEOUS) ×6 IMPLANT
NEEDLE 18GX1X1/2 (RX/OR ONLY) (NEEDLE) ×3 IMPLANT
NEEDLE 21 GA WING INFUSION (NEEDLE) ×3 IMPLANT
NEEDLE 22X1 1/2 (OR ONLY) (NEEDLE) ×3 IMPLANT
NEEDLE HYPO 25GX1X1/2 BEV (NEEDLE) ×3 IMPLANT
NEEDLE SPNL 22GX3.5 QUINCKE BK (NEEDLE) ×6 IMPLANT
NS IRRIG 1000ML POUR BTL (IV SOLUTION) ×12 IMPLANT
PACK GENERAL/GYN (CUSTOM PROCEDURE TRAY) ×9 IMPLANT
PAD ABD 8X10 STRL (GAUZE/BANDAGES/DRESSINGS) IMPLANT
PAD ARMBOARD 7.5X6 YLW CONV (MISCELLANEOUS) ×3 IMPLANT
PAD SHARPS MAGNETIC DISPOSAL (MISCELLANEOUS) ×3 IMPLANT
PIN SAFETY STERILE (MISCELLANEOUS) ×6 IMPLANT
PLASMABLADE 3.0S (MISCELLANEOUS) ×9
SET ASEPTIC TRANSFER (MISCELLANEOUS) ×9 IMPLANT
SPONGE GAUZE 4X4 12PLY STER LF (GAUZE/BANDAGES/DRESSINGS) ×3 IMPLANT
SPONGE LAP 18X18 X RAY DECT (DISPOSABLE) ×9 IMPLANT
STAPLER VISISTAT 35W (STAPLE) IMPLANT
SUT MNCRL AB 4-0 PS2 18 (SUTURE) ×6 IMPLANT
SUT MON AB 3-0 SH 27 (SUTURE) ×4
SUT MON AB 3-0 SH27 (SUTURE) ×2 IMPLANT
SUT MON AB 5-0 PS2 18 (SUTURE) ×6 IMPLANT
SUT PDS AB 2-0 CT1 27 (SUTURE) ×15 IMPLANT
SUT SILK 2 0 SH (SUTURE) ×6 IMPLANT
SUT SILK 3 0 SH 30 (SUTURE) ×6 IMPLANT
SUT VIC AB 3-0 SH 27 (SUTURE) ×6
SUT VIC AB 3-0 SH 27X BRD (SUTURE) ×3 IMPLANT
SUT VIC AB 3-0 SH 8-18 (SUTURE) ×6 IMPLANT
SUT VICRYL 4-0 PS2 18IN ABS (SUTURE) ×6 IMPLANT
SUT VICRYL AB 3 0 TIES (SUTURE) ×3 IMPLANT
SYR 50ML SLIP (SYRINGE) ×6 IMPLANT
SYR CONTROL 10ML LL (SYRINGE) ×3 IMPLANT
SYRINGE 60CC CATHETER 2 OZ (MISCELLANEOUS) IMPLANT
SYRINGE CONTROL L 12CC (SYRINGE) ×3 IMPLANT
TOWEL OR 17X24 6PK STRL BLUE (TOWEL DISPOSABLE) ×6 IMPLANT
TOWEL OR 17X26 10 PK STRL BLUE (TOWEL DISPOSABLE) ×6 IMPLANT
TRAY FOLEY CATH 14FRSI W/METER (CATHETERS) ×3 IMPLANT
TUBE CONNECTING 12'X1/4 (SUCTIONS) ×1
TUBE CONNECTING 12X1/4 (SUCTIONS) ×2 IMPLANT

## 2014-07-28 NOTE — Transfer of Care (Signed)
Immediate Anesthesia Transfer of Care Note  Patient: Tamara [name]  Procedure(s) Performed: Procedure(s): BILATERAL TOTAL MASTECTOMY WITH LEFT AXILLARY SENTINEL LYMPH NODE BIOPSY (Bilateral) BILATERAL BREAST RECONSTRUCTION WITH PLACEMENT OF TISSUE EXPANDER AND FLEX HD (ACELLULAR HYDRATED DERMIS) (Bilateral)  Patient Location: PACU  Anesthesia Type:General  Level of Consciousness: sedated  Airway & Oxygen Therapy: Patient Spontanous Breathing and Patient connected to nasal cannula oxygen  Post-op Assessment: Report given to PACU RN and Post -op Vital signs reviewed and stable  Post vital signs: Reviewed and stable  Complications: No apparent anesthesia complications

## 2014-07-28 NOTE — Op Note (Signed)
Op report    DATE OF OPERATION:  07/28/2014  LOCATION: [facility]  SURGICAL DIVISION: Plastic Surgery  PREOPERATIVE DIAGNOSES:  1. Left Breast cancer.    POSTOPERATIVE DIAGNOSES:  1. Left Breast cancer.   PROCEDURE:  1. Bilateral immediate breast reconstruction with placement of Acellular Dermal Matrix and tissue expanders.  SURGEON: Claire Sanger, DO  ASSISTANT: none  ANESTHESIA:  General.   COMPLICATIONS: None.   IMPLANTS: Left - Mentor 350 cc. 150 cc of saline was placed   Right - Mentor 350 cc. 150 cc saline was placed.   Acellular Dermal Matrix 4 x 16 cm x two  INDICATIONS FOR PROCEDURE:  The patient, Tamara [name], is a 43 y.o. female born on 03/10/1971, is here for  immediate first stage breast reconstruction with placement of bilateral tissue expander and Acellular dermal matrix. MRN: [id number]  CONSENT:  Informed consent was obtained directly from the patient. Risks, benefits and alternatives were fully discussed. Specific risks including but not limited to bleeding, infection, hematoma, seroma, scarring, pain, implant infection, implant extrusion, capsular contracture, asymmetry, wound healing problems, and need for further surgery were all discussed. The patient did have an ample opportunity to have her questions answered to her satisfaction.   DESCRIPTION OF PROCEDURE:  The patient was taken to the operating room by the general surgery team. SCDs were placed and IV antibiotics were given. [name] chest was prepped and draped in a sterile fashion. A time out was performed and the implants to be used were identified.  Bilateral mastectomies were performed.  Once the general surgery team had completed their portion of the case the patient was rendered to the plastic and reconstructive surgery team.  The same procedure was done on both sides.  The pectoralis major muscle was lifted from the chest wall with release of the lateral edge and lateral inframammary  fold.  The pocket was irrigated with antibiotic solution and hemostasis was achieved with electrocautery.  The ADM was then prepared according to the manufacture guidelines and slits placed to help with postoperative fluid management.  The ADM was then sutured to the inferior and lateral edge of the inframammary fold with 2-0 PDS starting with an interrupted stitch and then a running stitch.  The lateral portion was sutured to with interrupted sutures after the expander was placed.  The expander was prepared according to the manufacture guidelines, the air evacuated and then it was placed under the ADM and pectoralis major muscle.  The inferior and lateral tabs were used to secure the expander to the chest wall with 2-0 PDS.  The drain was placed at the inframammary fold over the ADM and secured to the skin with 3-0 Silk.    The deep layers were closed with 3-0 Vicryl followed by 4-0 Monocryl.  The skin was closed with 5-0 Monocryl and then dermabond was applied.  The ABDs and breast binder were placed.  The patient tolerated the procedure well and there were no complications.  The patient was allowed to wake from anesthesia and taken to the recovery room in satisfactory condition.

## 2014-07-28 NOTE — Op Note (Signed)
Bilateral Mastectomy with Left Sentinel Node Biopsy Procedure Note  Indications: This patient presents with history of left breast cancer s/p neoadjuvant chemotherapy.  She is also BRCA-1 positive.    Pre-operative Diagnosis: left breast cancer, cT2N1 prior to chemo, now complete radiologic response  Post-operative Diagnosis:  same  Surgeon: Stark Klein   Anesthesia: General endotracheal anesthesia and Local anesthesia 0.25.% bupivacaine, with epinephrine  ASA Class: 2  Procedure Details  The patient was seen in the Holding Room. The risks, benefits, complications, treatment options, and expected outcomes were discussed with the patient. The possibilities of reaction to medication, pulmonary aspiration, bleeding, infection, the need for additional procedures, failure to diagnose a condition, and creating a complication requiring transfusion or operation were discussed with the patient. The patient concurred with the proposed plan, giving informed consent.  The site of surgery properly noted/marked. The patient was taken to Operating Room # 2, identified as Tamara [name] and the procedure verified as bilateral Mastectomy and left Sentinel Node Biopsy, possible axillary lymph node dissection. A Time Out was held and the above information confirmed.  The methylene blue was injected in the subareolar location.    After induction of anesthesia, the bilateral arm, breast, and chest were prepped and draped in standard fashion.   The borders of the breasts were identified and marked.  Teardrop circumareolar incisions were drawn out on both breasts.  The left breast was approached first.   The superior incision was made with the PlasmaBlade.  The marcaine/saline mixture was infiltrated into the superior flap.  Mastectomy hooks were used to provide elevation of the skin edges, and the PlasmaBlade was used to create the mastectomy flaps.  The dissection was taken to the fascia of the pectoralis major.  The  penetrating vessels were clipped.  The superior flap was taken medially to the lateral sternal border, superiorly to the inferior border of the clavicle.  The inferior flap was similarly created, inferiorly to the inframammary fold and laterally to the border of the latissimus.  The breast was taken off including the pectoralis fascia and the axillary tail marked.    Using a hand-held gamma probe, axillary sentinel nodes were identified.  3 level 2 axillary sentinel nodes were removed and submitted to pathology.  The findings are below.  The lymphovascular channels were clipped with metal clips.    The frozen section on these nodes was negative.      The wound was irrigated.  Hemostasis was achieved with cautery. The left breast cavity was packed with sponges while waiting for the pathology.  The right breast was then approached with fresh instruments, gown, and gloves.    Mastectomy flaps were similarly created on the right.  Both breast cavities were reinspected for hemostasis and sponges were counted.    Sterile dressings were applied. At the end of the operation, all sponge, instrument, and needle counts were correct.  Findings: grossly clear surgical margins, SLN #1 and #2 palpable.  #3 cps 50.    Estimated Blood Loss:  less than 100 mL         Drains: per Dr. Migdalia Dk                Specimens: left breast, right breast,  and 3 axillary sentinel nodes         Complications:  None; patient tolerated the procedure well.         Disposition: PACU - hemodynamically stable.         Condition: stable

## 2014-07-28 NOTE — H&P (View-Only) (Signed)
HISTORY:  Pt is a 43 yo F who is s/p neoadjuvant chemotherapy for a cT2N1M0 left breast cancer.  She has been found to be BRCA positive.  She had horrible port a cath infection and had to have it removed with packing.  She has been receiving chemo via PICC line.  She feels that she has had great response of mass to the chemo.  She does still have to receive herceptin until march 2016.     PERTINENT REVIEW OF SYSTEMS: O/w negative.    Filed Vitals:   07/05/14 1536  BP: 142/84  Pulse: 72  Temp: 98.6 F (37 C)  Resp: 16   Wt Readings from Last 3 Encounters:  07/05/14 197 lb 3.2 oz (89.449 kg)  07/05/14 196 lb (88.905 kg)  06/30/14 194 lb (87.998 kg)    EXAM: Head: Normocephalic and atraumatic.  Eyes:  Conjunctivae are normal. Pupils are equal, round, and reactive to light. No scleral icterus.  Neck:  Normal range of motion. Neck supple. No tracheal deviation present. No thyromegaly present.  Breast:  Left UOQ mass is not palpable.  No LAD palpable.  No skin dimpling.  Right side normal.   Chest wall:  Former port a cath incision nearly completely epithelialized.   Resp: No respiratory distress, normal effort. Abd:  Abdomen is soft, non distended and non tender. No masses are palpable.  There is no rebound and no guarding.  Neurological: Alert and oriented to person, place, and time. Coordination normal.  Skin: Skin is warm and dry. No rash noted. No diaphoretic. No erythema. No pallor.  Psychiatric: Normal mood and affect. Normal behavior. Judgment and thought content normal.      ASSESSMENT AND PLAN:   BRCA1 positive We plan bilateral mastectomies, we just have not yet determined the timing.    She also plans to get oophorectomy.    Breast cancer of upper-outer quadrant of left female breast Clinically this has reduced significantly.   We await MRI to see total tumor size and whether or not lymph nodes are still enlarged.    I will discuss patient with radiation and  plastics before coming up with final plan.   At the least, we will do left mastectomy with SLN bx.  We may end up doing bilateral mastectomies with L ALND.   I will find a date for her and after the other information is available, we will come up with a final plan.    30 min spent in counseling.        Milus Height, MD Surgical Oncology, [facility]  Angelica Chessman, MD Gordy Levan, MD

## 2014-07-28 NOTE — Anesthesia Postprocedure Evaluation (Signed)
  Anesthesia Post-op Note  Patient: Tamara [name]  Procedure(s) Performed: Procedure(s): BILATERAL TOTAL MASTECTOMY WITH LEFT AXILLARY SENTINEL LYMPH NODE BIOPSY (Bilateral) BILATERAL BREAST RECONSTRUCTION WITH PLACEMENT OF TISSUE EXPANDER AND FLEX HD (ACELLULAR HYDRATED DERMIS) (Bilateral)  Patient Location: PACU  Anesthesia Type:General  Level of Consciousness: awake, alert , oriented, patient cooperative and responds to stimulation  Airway and Oxygen Therapy: Patient Spontanous Breathing  Post-op Pain: none  Post-op Assessment: Post-op Vital signs reviewed, Patient's Cardiovascular Status Stable, Respiratory Function Stable, Patent Airway, No signs of Nausea or vomiting and Pain level controlled  Post-op Vital Signs: Reviewed and stable  Last Vitals:  Filed Vitals:   07/28/14 1622  BP: 135/68  Pulse: 71  Temp: 36.3 C  Resp: 16    Complications: No apparent anesthesia complications

## 2014-07-28 NOTE — Progress Notes (Signed)
Called in refill for lovenox as courtesy for patient as she is in the hospital.

## 2014-07-28 NOTE — Anesthesia Procedure Notes (Signed)
Procedure Name: Intubation Date/Time: 07/28/2014 11:02 AM Performed by: Maeola Harman Pre-anesthesia Checklist: Patient identified, Emergency Drugs available, Suction available, Patient being monitored and Timeout performed Patient Re-evaluated:Patient Re-evaluated prior to inductionOxygen Delivery Method: Circle system utilized Preoxygenation: Pre-oxygenation with 100% oxygen Intubation Type: IV induction Ventilation: Mask ventilation without difficulty Laryngoscope Size: Mac and 4 Grade View: Grade I Tube type: Oral Tube size: 7.5 mm Number of attempts: 1 Airway Equipment and Method: Stylet Placement Confirmation: ETT inserted through vocal cords under direct vision,  positive ETCO2 and breath sounds checked- equal and bilateral Secured at: 22 cm Tube secured with: Tape Dental Injury: Teeth and Oropharynx as per pre-operative assessment  Comments: Easy atraumatic induction and intubation with MAC 4 blade.  Dr Carrie Mew verified placement of ETT.  Tamara Session, CRNA

## 2014-07-28 NOTE — Interval H&P Note (Signed)
History and Physical Interval Note:  07/28/2014 10:48 AM  [name]  has presented today for surgery, with the diagnosis of left breast cancer  The various methods of treatment have been discussed with the patient and family. After consideration of risks, benefits and other options for treatment, the patient has consented to  Procedure(s): BILATERAL TOTAL MASTECTOMY LEFT SENTINEL LYMPH NODE BIOPSY, POSSIBLE LEFT AXILLARY NODE DISSECTION (Bilateral) BREAST RECONSTRUCTION WITH PLACEMENT OF TISSUE EXPANDER AND FLEX HD (ACELLULAR HYDRATED DERMIS) LEFT BREAST (Left) as a surgical intervention .  [name] history has been reviewed, patient examined, no change in status, stable for surgery.  I have reviewed the patient's chart and labs.  Questions were answered to the patient's satisfaction.     Tamara [name]

## 2014-07-28 NOTE — Brief Op Note (Signed)
07/28/2014  12:49 PM  PATIENT:  Tamara [name]  43 y.o. female  PRE-OPERATIVE DIAGNOSIS:  Left breast cancer  POST-OPERATIVE DIAGNOSIS:  Left breast cancer  PROCEDURE:  Procedure(s): BILATERAL TOTAL MASTECTOMY LEFT SENTINEL LYMPH NODE BIOPSY, POSSIBLE LEFT AXILLARY NODE DISSECTION (Bilateral) BREAST RECONSTRUCTION WITH PLACEMENT OF TISSUE EXPANDER AND FLEX HD (ACELLULAR HYDRATED DERMIS) LEFT BREAST (Left)  SURGEON:  Surgeon(s) and Role: Panel 1:    * Stark Klein, MD - Primary  Panel 2:    * Claire Sanger, DO - Primary  PHYSICIAN ASSISTANT: Shawn Rayburn, PA  ASSISTANTS: none   ANESTHESIA:   general  EBL:  Total I/O In: 500 [I.V.:500] Out: 425 [Urine:325; Blood:100]  BLOOD ADMINISTERED:none  DRAINS: (2) Jackson-Pratt drain(s) with closed bulb suction in the breast pockets   LOCAL MEDICATIONS USED:  NONE  SPECIMEN:  No Specimen  DISPOSITION OF SPECIMEN:  N/A  COUNTS:  YES  TOURNIQUET:  * No tourniquets in log *  DICTATION: .Dragon Dictation  PLAN OF CARE: Admit to inpatient   PATIENT DISPOSITION:  PACU - hemodynamically stable.   Delay start of Pharmacological VTE agent (>24hrs) due to surgical blood loss or risk of bleeding: no

## 2014-07-28 NOTE — H&P (Signed)
Lian Pounds is an 43 y.o. female.   Chief Complaint: breast cancer HPI: The patient is a 43 yrs old bf here for a consultation for breast reconstruction. She noted a lump in her left lateral breast and underwent a mammogram which was positive for breast cancer. There was concern for positive lymph nodes as well. 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 She is 5 feet 5 inches tall, weighs 190 pounds and wears a 36 B. She would like to be around the same size or a C cup. She had a c-section and breast feed her child. The BRCA-1 was positive and she has a strong family history for colon cancer and breast cancer. She is currently staged at II (T2 N1). She is strongly considering bilateral mastectomies with reconstruction followed by salpingo-oophorectomies. At this time she would like to move ahead with left mastectomy and reconstruction.  She completed chemotherapy at the end of July and is interested in immediate reconstruction.  She did develop an infection in her port site and had to have that removed. She has a PICC line and completed her treatments through this access.  Past Medical History  Diagnosis Date  . Endometriosis   . Hypertension   . Rash     fine rash on abd  . Anemia   . Wears glasses   . 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 Iron deficiency anemia, unspecified 02/25/2014  . BRCA1 positive     c.190T>G (p.Cys64Gly) @ [facility]   . Shortness of breath      when Hemoglobin low  . History of blood transfusion   . DVT (deep venous thrombosis)     Right Subclavian and IJ.    Past Surgical History  Procedure Laterality Date  . Cervical polypectomy  2010  . Cesarean section      one previous  . Tubal ligation    . Portacath placement Right 02/23/2014    Procedure: INSERTION PORT-A-CATH;  Surgeon: Joyice Faster. Cornett, MD;   Location: [place];  Service: General;  Laterality: Right;  . Port-a-cath removal N/A 05/21/2014    Procedure: REMOVAL PORT-A-CATH;  Surgeon: Zenovia Jarred, MD;  Location: [place];  Service: General;  Laterality: N/A;  . Tee without cardioversion N/A 05/26/2014    Procedure: TRANSESOPHAGEAL ECHOCARDIOGRAM (TEE);  Surgeon: Larey Dresser, MD;  Location: [facility];  Service: Cardiovascular;  Laterality: N/A;    Family History  Problem Relation Age of Onset  . Breast cancer Mother 76    currently 2  . Diabetes Father   . Pancreatic cancer Father 41  . Breast cancer Paternal Aunt 56    currently 47; BRCA1 positive  . Stroke Maternal Grandfather   . Cancer Paternal Aunt     unk. primary; deceased 73s  . Breast cancer Cousin     daughter of unaffected paternal aunt; dx in her 97s   Social History:  reports that she quit smoking about 5 years ago. She has quit using smokeless tobacco. She reports that she drinks alcohol. She reports that she does not use illicit drugs.  Allergies:  Allergies  Allergen Reactions  . Compazine [Prochlorperazine Edisylate] Other (See Comments)    Medications Prior to Admission  Medication Sig Dispense Refill  . carvedilol (COREG) 6.25 MG tablet Take 1 tablet (6.25 mg total) by mouth 2 (two) times daily with  a meal.  60 tablet  6  . cloNIDine (CATAPRES) 0.2 MG tablet Take 0.5 tablets (0.1 mg total) by mouth 2 (two) times daily.  60 tablet  6  . dexamethasone (DECADRON) 4 MG tablet Take 2 tablets (8 mg total) by mouth 2 (two) times daily. Start the day before Taxotere. Then again the day after chemo for 3 days.  30 tablet  1  . enoxaparin (LOVENOX) 100 MG/ML injection Inject 0.95 mLs (95 mg total) into the skin every 12 (twelve) hours.  60 Syringe  1  . ferrous sulfate 325 (65 FE) MG EC tablet Take 1 tablet (325 mg total) by mouth 3 (three) times daily with meals.  90 tablet  3  . hydrALAZINE (APRESOLINE) 50 MG tablet Take 1 tablet (50 mg  total) by mouth every 8 (eight) hours.  90 tablet  6  . ibuprofen (ADVIL,MOTRIN) 600 MG tablet Take 600 mg by mouth every 6 (six) hours as needed (pain).      Marland Kitchen lidocaine-prilocaine (EMLA) cream Apply 1 application topically as needed (For port-a-cath.).  30 g  1  . lisinopril (PRINIVIL,ZESTRIL) 40 MG tablet Take 40 mg by mouth daily.      Marland Kitchen loratadine (CLARITIN) 10 MG tablet Take 10 mg by mouth daily as needed (For inflammation after receiving her injections during chemo.).       Marland Kitchen LORazepam (ATIVAN) 0.5 MG tablet Take 1 tablet (0.5 mg total) by mouth every 6 (six) hours as needed (Nausea or vomiting).  30 tablet  0  . ondansetron (ZOFRAN) 8 MG tablet Take 1 tablet (8 mg total) by mouth 2 (two) times daily. Start the day after chemo for 3 days. Then take as needed for nausea or vomiting.  30 tablet  1  . oxyCODONE (OXY IR/ROXICODONE) 5 MG immediate release tablet Take 5 mg by mouth every 4 (four) hours as needed (pain).      Marland Kitchen oxyCODONE-acetaminophen (PERCOCET/ROXICET) 5-325 MG per tablet Take 1 tablet by mouth every 4 (four) hours as needed for severe pain.       . pantoprazole (PROTONIX) 40 MG tablet Take 1 tablet (40 mg total) by mouth daily. For stomach irritation.  30 tablet  1  . PRESCRIPTION MEDICATION She receives her chemo treatments at the [facility]. Her oncologist is Dr. Humphrey Rolls. She received her last chemo treatments of Taxotere 132m Injection, Carboplatin 8461m Herceptin 52564mnd Perjeta 420m45m 04/08/14. She also received Neulasta 6mg 58mection on 04/09/14.      . proMarland Kitchenethazine (PHENERGAN) 25 MG tablet Take 1 tablet (25 mg total) by mouth every 6 (six) hours as needed for nausea or vomiting.  30 tablet  0    No results found for this or any previous visit (from the past 48 hour(s)). No results found.  Review of Systems  Constitutional: Negative.   HENT: Negative.   Eyes: Negative.   Respiratory: Negative.   Cardiovascular: Negative.   Gastrointestinal:  Negative.   Genitourinary: Negative.   Musculoskeletal: Negative.   Skin: Negative.   Neurological: Negative.   Psychiatric/Behavioral: Negative.     Blood pressure 137/99, pulse 70, temperature 97.3 F (36.3 C), temperature source Oral, resp. rate 16, height 5' 5"  (1.651 m), weight 90.357 kg (199 lb 3.2 oz), SpO2 100.00%. Physical Exam  Constitutional: She is oriented to person, place, and time. She appears well-developed and well-nourished.  HENT:  Head: Normocephalic and atraumatic.  Eyes: Conjunctivae are normal. Pupils are equal, round, and reactive to  light.  Cardiovascular: Normal rate.   GI: Soft.  Musculoskeletal: Normal range of motion.  Neurological: She is alert and oriented to person, place, and time.  Skin: Skin is warm.  Psychiatric: She has a normal mood and affect. Her behavior is normal. Judgment and thought content normal.     Assessment/Plan The patient has decided to undergo bilateral mastectomies with breast reconstruction using expander and FlexHD placement.  Risks and complications were discussed.      Maurice 07/28/2014, 12:45 PM

## 2014-07-29 ENCOUNTER — Encounter (HOSPITAL_COMMUNITY): Payer: Self-pay | Admitting: General Surgery

## 2014-07-29 MED ORDER — DIPHENHYDRAMINE HCL 25 MG PO CAPS
25.0000 mg | ORAL_CAPSULE | Freq: Four times a day (QID) | ORAL | Status: DC | PRN
  Administered 2014-07-29 – 2014-07-30 (×4): 25 mg via ORAL
  Filled 2014-07-29 (×4): qty 1

## 2014-07-29 NOTE — Progress Notes (Signed)
Called Dr Migdalia Dk for BP orders, order given to hold tonight's dose of hydralazine and clonidine

## 2014-07-29 NOTE — Progress Notes (Signed)
1 Day Post-Op  Subjective: Pt very sore, but just got some pain medicine (IV dilaudid) which helped.  She is also having some itching.    Objective: Vital signs in last 24 hours: Temp:  [97.4 F (36.3 C)-98.3 F (36.8 C)] 98.3 F (36.8 C) (08/27 0603) Pulse Rate:  [69-78] 74 (08/27 0603) Resp:  [11-19] 16 (08/27 0603) BP: (96-144)/(48-108) 96/60 mmHg (08/27 0603) SpO2:  [97 %-100 %] 98 % (08/27 0603) Last BM Date: 07/27/14  Intake/Output from previous day: 08/26 0701 - 08/27 0700 In: 2340 [P.O.:240; I.V.:2100] Out: 977 [Urine:660; Drains:217; Blood:100] Intake/Output this shift:    General appearance: alert, cooperative and no distress Resp: breathing comfortably Chest wall: bilateral chest wall tenderness as expected GI: s, nt, nd  Lab Results:   Recent Labs  07/28/14 1700  WBC 11.2*  HGB 10.4*  HCT 31.3*  PLT 267   BMET  Recent Labs  07/28/14 1700  NA 139  K 4.8  CL 102  CO2 25  GLUCOSE 136*  BUN 13  CREATININE 0.77  CALCIUM 8.9   PT/INR No results found for this basename: LABPROT, INR,  in the last 72 hours ABG No results found for this basename: PHART, PCO2, PO2, HCO3,  in the last 72 hours  Studies/Results: Nm Sentinel Node Inj-no Rpt (breast)  07/28/2014   CLINICAL DATA: left breast cancer   Sulfur colloid was injected intradermally by the nuclear medicine  technologist for breast cancer sentinel node localization.     Anti-infectives: Anti-infectives   Start     Dose/Rate Route Frequency Ordered Stop   07/28/14 2200  ceFAZolin (ANCEF) IVPB 1 g/50 mL premix     1 g 100 mL/hr over 30 Minutes Intravenous 3 times per day 07/28/14 1629     07/28/14 1259  polymyxin B 500,000 Units, bacitracin 50,000 Units in sodium chloride irrigation 0.9 % 500 mL irrigation  Status:  Discontinued       As needed 07/28/14 1259 07/28/14 1507   07/28/14 0857  ceFAZolin (ANCEF) IVPB 2 g/50 mL premix     2 g 100 mL/hr over 30 Minutes Intravenous On call to O.R.  07/28/14 0857 07/28/14 1450      Assessment/Plan: s/p Procedure(s): BILATERAL TOTAL MASTECTOMY WITH LEFT AXILLARY SENTINEL LYMPH NODE BIOPSY (Bilateral) BILATERAL BREAST RECONSTRUCTION WITH PLACEMENT OF TISSUE EXPANDER AND FLEX HD (ACELLULAR HYDRATED DERMIS) (Bilateral) diet as tolerated Incentive spirometry Pain control Home per Dr. Migdalia Dk later today or tomorrow depending on pain control. Benadryl for itching.    LOS: 1 day    [facility] 07/29/2014

## 2014-07-29 NOTE — Progress Notes (Signed)
1 Day Post-Op  Subjective: The patient is post operative day 1 and doing well.  She has been up to the bathroom.  Her pain is well controlled.  She is tolerating food well.   Objective: Vital signs in last 24 hours: Temp:  [97.4 F (36.3 C)-98.3 F (36.8 C)] 98.3 F (36.8 C) (08/27 0603) Pulse Rate:  [69-78] 74 (08/27 0603) Resp:  [11-19] 16 (08/27 0603) BP: (96-144)/(48-108) 96/60 mmHg (08/27 0603) SpO2:  [97 %-100 %] 98 % (08/27 0603) Last BM Date: 07/27/14  Intake/Output from previous day: 08/26 0701 - 08/27 0700 In: 2340 [P.O.:240; I.V.:2100] Out: 977 [Urine:660; Drains:217; Blood:100] Intake/Output this shift:    General appearance: alert, cooperative and no distress Incision/Wound: healing well, no sign of hematoma. Flaps are warm and dry.  Lab Results:   Recent Labs  07/28/14 1700  WBC 11.2*  HGB 10.4*  HCT 31.3*  PLT 267   BMET  Recent Labs  07/28/14 1700  NA 139  K 4.8  CL 102  CO2 25  GLUCOSE 136*  BUN 13  CREATININE 0.77  CALCIUM 8.9   PT/INR No results found for this basename: LABPROT, INR,  in the last 72 hours ABG No results found for this basename: PHART, PCO2, PO2, HCO3,  in the last 72 hours  Studies/Results: Nm Sentinel Node Inj-no Rpt (breast)  07/28/2014   CLINICAL DATA: left breast cancer   Sulfur colloid was injected intradermally by the nuclear medicine  technologist for breast cancer sentinel node localization.     Anti-infectives: Anti-infectives   Start     Dose/Rate Route Frequency Ordered Stop   07/28/14 2200  ceFAZolin (ANCEF) IVPB 1 g/50 mL premix     1 g 100 mL/hr over 30 Minutes Intravenous 3 times per day 07/28/14 1629     07/28/14 1259  polymyxin B 500,000 Units, bacitracin 50,000 Units in sodium chloride irrigation 0.9 % 500 mL irrigation  Status:  Discontinued       As needed 07/28/14 1259 07/28/14 1507   07/28/14 0857  ceFAZolin (ANCEF) IVPB 2 g/50 mL premix     2 g 100 mL/hr over 30 Minutes Intravenous On call  to O.R. 07/28/14 0857 07/28/14 1450      Assessment/Plan: s/p Procedure(s): BILATERAL TOTAL MASTECTOMY WITH LEFT AXILLARY SENTINEL LYMPH NODE BIOPSY (Bilateral) BILATERAL BREAST RECONSTRUCTION WITH PLACEMENT OF TISSUE EXPANDER AND FLEX HD (ACELLULAR HYDRATED DERMIS) (Bilateral) Plan for discharge tomorrow  LOS: 1 day    [facility] 07/29/2014

## 2014-07-30 MED ORDER — OXYCODONE HCL 5 MG PO TABS: 5.0000 mg | ORAL_TABLET | ORAL | Status: DC | PRN

## 2014-07-30 MED ORDER — OXYCODONE HCL 5 MG PO TABS: 15.0000 mg | ORAL_TABLET | ORAL | Status: DC | PRN

## 2014-07-30 MED ORDER — CEPHALEXIN 500 MG PO CAPS
500.0000 mg | ORAL_CAPSULE | Freq: Four times a day (QID) | ORAL | Status: DC
  Administered 2014-07-30 – 2014-07-31 (×5): 500 mg via ORAL
  Filled 2014-07-30 (×8): qty 1

## 2014-07-30 MED ORDER — OXYCODONE HCL 5 MG PO TABS
5.0000 mg | ORAL_TABLET | ORAL | Status: DC | PRN
  Administered 2014-07-30: 10 mg via ORAL
  Filled 2014-07-30: qty 2

## 2014-07-30 MED ORDER — DSS 100 MG PO CAPS: 100.0000 mg | ORAL_CAPSULE | Freq: Two times a day (BID) | ORAL | Status: DC

## 2014-07-30 MED ORDER — ACETAMINOPHEN 325 MG PO TABS: 650.0000 mg | ORAL_TABLET | Freq: Four times a day (QID) | ORAL | Status: DC | PRN

## 2014-07-30 MED ORDER — OXYCODONE HCL ER 20 MG PO T12A: 20.0000 mg | EXTENDED_RELEASE_TABLET | Freq: Two times a day (BID) | ORAL | Status: DC

## 2014-07-30 MED ORDER — OXYCODONE HCL 5 MG PO TABS
15.0000 mg | ORAL_TABLET | ORAL | Status: DC | PRN
  Administered 2014-07-30 – 2014-07-31 (×3): 20 mg via ORAL
  Filled 2014-07-30 (×3): qty 4

## 2014-07-30 MED ORDER — CEPHALEXIN 500 MG PO CAPS: 500.0000 mg | ORAL_CAPSULE | Freq: Four times a day (QID) | ORAL | Status: DC

## 2014-07-30 MED ORDER — DIAZEPAM 2 MG PO TABS: 2.0000 mg | ORAL_TABLET | Freq: Two times a day (BID) | ORAL | Status: DC | PRN

## 2014-07-30 MED ORDER — DIPHENHYDRAMINE HCL 25 MG PO CAPS: 25.0000 mg | ORAL_CAPSULE | Freq: Four times a day (QID) | ORAL | Status: DC | PRN

## 2014-07-30 NOTE — Progress Notes (Signed)
Patient ID: Elverna Caffee, female   DOB: [birth year], 43 y.o.   MRN: [id number] 2 Days Post-Op  Subjective: Pt switched to oxycodone, but still having significant issues with pain.  Describes burning sensation at drain sites.     Objective: Vital signs in last 24 hours: Temp:  [98.2 F (36.8 C)-98.5 F (36.9 C)] 98.5 F (36.9 C) (08/28 0505) Pulse Rate:  [85-88] 88 (08/28 0505) Resp:  [15-17] 15 (08/28 0505) BP: (109-137)/(46-56) 109/46 mmHg (08/28 0505) SpO2:  [93 %-98 %] 98 % (08/28 0505) Last BM Date: 07/27/14  Intake/Output from previous day: 08/27 0701 - 08/28 0700 In: 1410 [P.O.:360; I.V.:1000; IV Piggyback:50] Out: 605 [Urine:300; Drains:305] Intake/Output this shift: Total I/O In: 240 [P.O.:240] Out: 335 [Urine:200; Drains:135]  General appearance: alert, cooperative and no distress Resp: breathing comfortably Chest wall: bilateral chest wall tenderness as expected GI: s, nt, nd  Lab Results:   Recent Labs  07/28/14 1700  WBC 11.2*  HGB 10.4*  HCT 31.3*  PLT 267   BMET  Recent Labs  07/28/14 1700  NA 139  K 4.8  CL 102  CO2 25  GLUCOSE 136*  BUN 13  CREATININE 0.77  CALCIUM 8.9   PT/INR No results found for this basename: LABPROT, INR,  in the last 72 hours ABG No results found for this basename: PHART, PCO2, PO2, HCO3,  in the last 72 hours  Studies/Results: No results found.  Anti-infectives: Anti-infectives   Start     Dose/Rate Route Frequency Ordered Stop   07/30/14 0800  cephALEXin (KEFLEX) capsule 500 mg     500 mg Oral 4 times per day 07/30/14 0757     07/30/14 0000  cephALEXin (KEFLEX) 500 MG capsule     500 mg Oral Every 6 hours 07/30/14 0802     07/28/14 2200  ceFAZolin (ANCEF) IVPB 1 g/50 mL premix  Status:  Discontinued     1 g 100 mL/hr over 30 Minutes Intravenous 3 times per day 07/28/14 1629 07/30/14 0757   07/28/14 1259  polymyxin B 500,000 Units, bacitracin 50,000 Units in sodium chloride irrigation 0.9 % 500 mL  irrigation  Status:  Discontinued       As needed 07/28/14 1259 07/28/14 1507   07/28/14 0857  ceFAZolin (ANCEF) IVPB 2 g/50 mL premix     2 g 100 mL/hr over 30 Minutes Intravenous On call to O.R. 07/28/14 0857 07/28/14 1450      Assessment/Plan: s/p Procedure(s): BILATERAL TOTAL MASTECTOMY WITH LEFT AXILLARY SENTINEL LYMPH NODE BIOPSY (Bilateral) BILATERAL BREAST RECONSTRUCTION WITH PLACEMENT OF TISSUE EXPANDER AND FLEX HD (ACELLULAR HYDRATED DERMIS) (Bilateral) diet as tolerated Incentive spirometry Increase oxycodone and add flexeril for muscle relaxant Benadryl for itching.    LOS: 2 days    [facility] 07/30/2014

## 2014-07-30 NOTE — Discharge Summary (Signed)
Physician Discharge Summary  Patient ID: Tamara [name] MRN: [id number] DOB/AGE: [birth year] 43 y.o.  Admit date: 07/28/2014 Discharge date:07/31/2014 Admission Diagnoses: Breast cancer   Discharge Diagnoses:  Active Problems:   Breast cancer   Discharged Condition: good  Hospital Course: HPI: The patient is a 43 yrs old AA female admitted for bilateral mastectomies and left sentinel node biopsy for left breast cancer with immediate reconstruction with bilateral breast tissue expanders and placement of Acellular Dermal Matrix for breast reconstruction.  She noted a lump in her left lateral breast and underwent a mammogram which was positive for breast cancer. There was concern for positive lymph nodes as well. 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 She is currently staged at II (T2 N1). She completed chemotherapy at the end of July. She did develop an infection in her port site and had to have that removed. She has a PICC line and completed her treatments through this access.  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  She underwent bilateral mastectomies with left sentinel node biopsy per Dr. Barry Dienes and immediate reconstruction per Dr. Migdalia Dk as noted and has done well post operatively. She did require increases in pain medication and muscle relaxants to assist with pain control.     Consults: None  Significant Diagnostic Studies: Pathology- no residual cancer.    Treatments: surgery:Bilateral Mastectomy with Left Sentinel Node Biopsy per Dr. Barry Dienes Followed by Bilateral immediate breast reconstruction with placement of Acellular Dermal Matrix and tissue expanders per Dr. Migdalia Dk  Discharge Exam: Blood pressure 99/52, pulse  97, temperature 98.1 F (36.7 C), temperature source Oral, resp. rate 16, height _0  (1.651 m), weight 199 lb 3.2 oz (90.357 kg), SpO2 95.00%. General appearance: alert, cooperative, appears stated age and mild distress Resp: clear to auscultation bilaterally Cardio: regular rate and rhythm Bilateral breast incisions are clean, dry and intact and without signs of hematoma or infection  Disposition: 01-Home or Self Care      Discharge Instructions   Call MD for:  difficulty breathing, headache or visual disturbances    Complete by:  As directed      Call MD for:  persistant nausea and vomiting    Complete by:  As directed      Call MD for:  redness, tenderness, or signs of infection (pain, swelling, redness, odor or green/yellow discharge around incision site)    Complete by:  As directed      Call MD for:  severe uncontrolled pain    Complete by:  As directed      Call MD for:  temperature >100.4    Complete by:  As directed      Change dressing (specify)    Complete by:  As directed   Measure and record drain output at least twice daily.  Bring record to clinic.     Diet - low sodium heart healthy    Complete by:  As directed      Increase activity slowly    Complete by:  As directed             Medication List    STOP taking these medications       dexamethasone 4 MG tablet  Commonly known as:  DECADRON     oxyCODONE  5 MG immediate release tablet  Commonly known as:  Oxy IR/ROXICODONE  Replaced by:  OxyCODONE 20 mg T12a 12 hr tablet     oxyCODONE-acetaminophen 5-325 MG per tablet  Commonly known as:  PERCOCET/ROXICET     PRESCRIPTION MEDICATION     promethazine 25 MG tablet  Commonly known as:  PHENERGAN      TAKE these medications       acetaminophen 325 MG tablet  Commonly known as:  TYLENOL  Take 2 tablets (650 mg total) by mouth every 6 (six) hours as needed for mild pain (or Temp > 100).     carvedilol 6.25 MG tablet  Commonly known as:  COREG   Take 1 tablet (6.25 mg total) by mouth 2 (two) times daily with a meal.     cephALEXin 500 MG capsule  Commonly known as:  KEFLEX  Take 1 capsule (500 mg total) by mouth every 6 (six) hours.     cloNIDine 0.2 MG tablet  Commonly known as:  CATAPRES  Take 0.5 tablets (0.1 mg total) by mouth 2 (two) times daily.     cyclobenzaprine 10 MG tablet  Commonly known as:  FLEXERIL  Take 1 tablet (10 mg total) by mouth 3 (three) times daily as needed for muscle spasms.     diazepam 2 MG tablet  Commonly known as:  VALIUM  Take 1 tablet (2 mg total) by mouth every 12 (twelve) hours as needed for muscle spasms.     diphenhydrAMINE 25 mg capsule  Commonly known as:  BENADRYL  Take 1 capsule (25 mg total) by mouth every 6 (six) hours as needed for itching, allergies or sleep (anxiety).     DSS 100 MG Caps  Take 100 mg by mouth 2 (two) times daily.     enoxaparin 100 MG/ML injection  Commonly known as:  LOVENOX  Inject 0.95 mLs (95 mg total) into the skin every 12 (twelve) hours.     ferrous sulfate 325 (65 FE) MG EC tablet  Take 1 tablet (325 mg total) by mouth 3 (three) times daily with meals.     hydrALAZINE 50 MG tablet  Commonly known as:  APRESOLINE  Take 1 tablet (50 mg total) by mouth every 8 (eight) hours.     ibuprofen 600 MG tablet  Commonly known as:  ADVIL,MOTRIN  Take 600 mg by mouth every 6 (six) hours as needed (pain).     lidocaine-prilocaine cream  Commonly known as:  EMLA  Apply 1 application topically as needed (For port-a-cath.).     lisinopril 40 MG tablet  Commonly known as:  PRINIVIL,ZESTRIL  Take 40 mg by mouth daily.     loratadine 10 MG tablet  Commonly known as:  CLARITIN  Take 10 mg by mouth daily as needed (For inflammation after receiving her injections during chemo.).     LORazepam 0.5 MG tablet  Commonly known as:  ATIVAN  Take 1 tablet (0.5 mg total) by mouth every 6 (six) hours as needed (Nausea or vomiting).     ondansetron 8 MG tablet   Commonly known as:  ZOFRAN  Take 1 tablet (8 mg total) by mouth 2 (two) times daily. Start the day after chemo for 3 days. Then take as needed for nausea or vomiting.     OxyCODONE 20 mg T12a 12 hr tablet  Commonly known as:  OXYCONTIN  Take 1 tablet (20 mg total) by mouth every 12 (twelve) hours.     oxyCODONE 5  MG immediate release tablet  Commonly known as:  Oxy IR/ROXICODONE  Take 3-4 tablets (15-20 mg total) by mouth every 4 (four) hours as needed for moderate pain, severe pain or breakthrough pain.     pantoprazole 40 MG tablet  Commonly known as:  PROTONIX  Take 1 tablet (40 mg total) by mouth daily. For stomach irritation.       Follow-up Information   Follow up with [facility], DO On 08/06/2014. (call to make appointment for next Friday)    Specialty:  Plastic Surgery   Contact information:   [place] [address] [phone]       Follow up with [facility], MD In 2 weeks.   Specialty:  General Surgery   Contact information:   [address] [place] [address] [phone]       Signed: [facility] [phone]  Co signed

## 2014-07-30 NOTE — Progress Notes (Signed)
2 Days Post-Op  Subjective: Still very sore. Would like to try Oxy IR for pain and this has been ordered. If it works better, she plans to go home later today.  Bilateral breast incisions are clean, dry and intact and without signs of infection or hematoma.  JP drainage is as expected.   Objective: Vital signs in last 24 hours: Temp:  [98 F (36.7 C)-98.5 F (36.9 C)] 98.5 F (36.9 C) (08/28 0505) Pulse Rate:  [74-88] 88 (08/28 0505) Resp:  [15-18] 15 (08/28 0505) BP: (93-137)/(46-69) 109/46 mmHg (08/28 0505) SpO2:  [93 %-98 %] 98 % (08/28 0505) Last BM Date: 07/27/14  Intake/Output from previous day: 08/27 0701 - 08/28 0700 In: 1410 [P.O.:360; I.V.:1000; IV Piggyback:50] Out: 605 [Urine:300; Drains:305] Intake/Output this shift:    General appearance: alert, cooperative and mild distress Resp: clear to auscultation bilaterally Cardio: regular rate and rhythm Bilateral breast flaps without signs of hematoma or infection.   Lab Results:   Recent Labs  07/28/14 1700  WBC 11.2*  HGB 10.4*  HCT 31.3*  PLT 267   BMET  Recent Labs  07/28/14 1700  NA 139  K 4.8  CL 102  CO2 25  GLUCOSE 136*  BUN 13  CREATININE 0.77  CALCIUM 8.9   PT/INR No results found for this basename: LABPROT, INR,  in the last 72 hours ABG No results found for this basename: PHART, PCO2, PO2, HCO3,  in the last 72 hours  Studies/Results: Nm Sentinel Node Inj-no Rpt (breast)  07/28/2014   CLINICAL DATA: left breast cancer   Sulfur colloid was injected intradermally by the nuclear medicine  technologist for breast cancer sentinel node localization.     Anti-infectives: Anti-infectives   Start     Dose/Rate Route Frequency Ordered Stop   07/28/14 2200  ceFAZolin (ANCEF) IVPB 1 g/50 mL premix     1 g 100 mL/hr over 30 Minutes Intravenous 3 times per day 07/28/14 1629     07/28/14 1259  polymyxin B 500,000 Units, bacitracin 50,000 Units in sodium chloride irrigation 0.9 % 500 mL  irrigation  Status:  Discontinued       As needed 07/28/14 1259 07/28/14 1507   07/28/14 0857  ceFAZolin (ANCEF) IVPB 2 g/50 mL premix     2 g 100 mL/hr over 30 Minutes Intravenous On call to O.R. 07/28/14 0857 07/28/14 1450      Assessment/Plan: s/p Procedure(s): BILATERAL TOTAL MASTECTOMY WITH LEFT AXILLARY SENTINEL LYMPH NODE BIOPSY (Bilateral) BILATERAL BREAST RECONSTRUCTION WITH PLACEMENT OF TISSUE EXPANDER AND FLEX HD (ACELLULAR HYDRATED DERMIS) (Bilateral) Discharge later today if pain well controlled   LOS: 2 days    Rage Beever,PA-C Plastic Surgery [phone]

## 2014-07-31 MED ORDER — HEPARIN SOD (PORK) LOCK FLUSH 100 UNIT/ML IV SOLN
250.0000 [IU] | INTRAVENOUS | Status: AC | PRN
  Administered 2014-07-31: 250 [IU]

## 2014-07-31 MED ORDER — CYCLOBENZAPRINE HCL 10 MG PO TABS: 10.0000 mg | ORAL_TABLET | Freq: Three times a day (TID) | ORAL | Status: DC | PRN

## 2014-07-31 NOTE — Progress Notes (Signed)
Patient ID: Tamara [name], female   DOB: 1971/11/26, 43 y.o.   MRN: [id number] 3 Days Post-Op  Subjective: Pt doing much better today with pain control regimen.  Pathology back and no residual cancer seen!     Objective: Vital signs in last 24 hours: Temp:  [98.3 F (36.8 C)-99.4 F (37.4 C)] 99.4 F (37.4 C) (08/29 0435) Pulse Rate:  [93-94] 93 (08/29 0435) Resp:  [15-16] 16 (08/29 0435) BP: (124-136)/(59-65) 129/59 mmHg (08/29 0435) SpO2:  [95 %-100 %] 95 % (08/29 0435) Last BM Date: 07/27/14  Intake/Output from previous day: 08/28 0701 - 08/29 0700 In: 462 [P.O.:462] Out: 1425 [Urine:900; Drains:525] Intake/Output this shift:    General appearance: alert, cooperative and no distress Resp: breathing comfortably Chest wall: bilateral chest wall tenderness as expected GI: s, nt, nd  Lab Results:   Recent Labs  07/28/14 1700  WBC 11.2*  HGB 10.4*  HCT 31.3*  PLT 267   BMET  Recent Labs  07/28/14 1700  NA 139  K 4.8  CL 102  CO2 25  GLUCOSE 136*  BUN 13  CREATININE 0.77  CALCIUM 8.9   PT/INR No results found for this basename: LABPROT, INR,  in the last 72 hours ABG No results found for this basename: PHART, PCO2, PO2, HCO3,  in the last 72 hours  Studies/Results: No results found.  Anti-infectives: Anti-infectives   Start     Dose/Rate Route Frequency Ordered Stop   07/30/14 0800  cephALEXin (KEFLEX) capsule 500 mg     500 mg Oral 4 times per day 07/30/14 0757     07/30/14 0000  cephALEXin (KEFLEX) 500 MG capsule     500 mg Oral Every 6 hours 07/30/14 0802     07/28/14 2200  ceFAZolin (ANCEF) IVPB 1 g/50 mL premix  Status:  Discontinued     1 g 100 mL/hr over 30 Minutes Intravenous 3 times per day 07/28/14 1629 07/30/14 0757   07/28/14 1259  polymyxin B 500,000 Units, bacitracin 50,000 Units in sodium chloride irrigation 0.9 % 500 mL irrigation  Status:  Discontinued       As needed 07/28/14 1259 07/28/14 1507   07/28/14 0857  ceFAZolin (ANCEF)  IVPB 2 g/50 mL premix     2 g 100 mL/hr over 30 Minutes Intravenous On call to O.R. 07/28/14 0857 07/28/14 1450      Assessment/Plan: s/p Procedure(s): BILATERAL TOTAL MASTECTOMY WITH LEFT AXILLARY SENTINEL LYMPH NODE BIOPSY (Bilateral) BILATERAL BREAST RECONSTRUCTION WITH PLACEMENT OF TISSUE EXPANDER AND FLEX HD (ACELLULAR HYDRATED DERMIS) (Bilateral) diet as tolerated Home today.      LOS: 3 days    [facility] 07/31/2014

## 2014-07-31 NOTE — Discharge Instructions (Signed)
[facility] °[phone] ° °MASTECTOMY: POST OP INSTRUCTIONS ° °Always review your discharge instruction sheet given to you by the facility where your surgery was performed. °IF YOU HAVE DISABILITY OR FAMILY LEAVE FORMS, YOU MUST BRING THEM TO THE OFFICE FOR PROCESSING.   °DO NOT GIVE THEM TO YOUR DOCTOR. °A prescription for pain medication may be given to you upon discharge.  Take your pain medication as prescribed, if needed.  If narcotic pain medicine is not needed, then you may take acetaminophen (Tylenol) or ibuprofen (Advil) as needed. °1. Take your usually prescribed medications unless otherwise directed. °2. If you need a refill on your pain medication, please contact your pharmacy.  They will contact our office to request authorization.  Prescriptions will not be filled after 5pm or on week-ends. °3. You should follow a light diet the first few days after arrival home, such as soup and crackers, etc.  Resume your normal diet the day after surgery. °4. Most patients will experience some swelling and bruising on the chest and underarm.  Ice packs will help.  Swelling and bruising can take several days to resolve.  °5. It is common to experience some constipation if taking pain medication after surgery.  Increasing fluid intake and taking a stool softener (such as Colace) will usually help or prevent this problem from occurring.  A mild laxative (Milk of Magnesia or Miralax) should be taken according to package instructions if there are no bowel movements after 48 hours. °6. Unless discharge instructions indicate otherwise, leave your bandage dry and in place until your next appointment in 3-5 days.  You may take a limited sponge bath.  No tube baths or showers until the drains are removed.  You may have steri-strips (small skin tapes) in place directly over the incision.  These strips should be left on the skin for 7-10 days.  If your surgeon used skin glue on the incision, you may  shower in 24 hours.  The glue will flake off over the next 2-3 weeks.  Any sutures or staples will be removed at the office during your follow-up visit. °7. DRAINS:  If you have drains in place, it is important to keep a list of the amount of drainage produced each day in your drains.  Before leaving the hospital, you should be instructed on drain care.  Call our office if you have any questions about your drains. °8. ACTIVITIES:  You may resume regular (light) daily activities beginning the next day--such as daily self-care, walking, climbing stairs--gradually increasing activities as tolerated.  You may have sexual intercourse when it is comfortable.  Refrain from any heavy lifting or straining until approved by your doctor. °a. You may drive when you are no longer taking prescription pain medication, you can comfortably wear a seatbelt, and you can safely maneuver your car and apply brakes. °b. RETURN TO WORK:  __________________________________________________________ °9. You should see your doctor in the office for a follow-up appointment approximately 3-5 days after your surgery.  Your doctor’s nurse will typically make your follow-up appointment when she calls you with your pathology report.  Expect your pathology report 2-3 business days after your surgery.  You may call to check if you do not hear from us after three days.   °10. OTHER INSTRUCTIONS: ______________________________________________________________________________________________ ____________________________________________________________________________________________ °WHEN TO CALL YOUR DOCTOR: °1. Fever over 101.0 °2. Nausea and/or vomiting °3. Extreme swelling or bruising °4. Continued bleeding from incision. °5. Increased pain, redness, or drainage from the incision. °  The clinic staff is available to answer your questions during regular business hours.  Please don’t hesitate to call and ask to speak to one of the nurses for clinical  concerns.  If you have a medical emergency, go to the nearest emergency room or call 911.  A surgeon from [facility] is always on call at the hospital. °[address], [address], [place], [place]  [address] ? [address] [address], [place], [place]   [address] °[phone] ? [phone] ? FAX [phone] °Web site: [email or web address] °

## 2014-07-31 NOTE — Progress Notes (Signed)
3 Days Post-Op  Subjective: Doing much better with increased Oxy IR as noted.  Ready for discharge today.   Objective: Vital signs in last 24 hours: Temp:  [98.3 F (36.8 C)-99.4 F (37.4 C)] 99.4 F (37.4 C) (08/29 0435) Pulse Rate:  [93-94] 93 (08/29 0435) Resp:  [15-16] 16 (08/29 0435) BP: (124-136)/(59-65) 129/59 mmHg (08/29 0435) SpO2:  [95 %-100 %] 95 % (08/29 0435) Last BM Date: 07/27/14  Intake/Output from previous day: 08/28 0701 - 08/29 0700 In: 34 [P.O.:462] Out: 1425 [Urine:900; Drains:525] Intake/Output this shift: Total I/O In: -  Out: 60 [Drains:60]  General appearance: alert, cooperative, appears stated age and no distress Bilateral breast flaps are clean, dry and intact and without signs of infection or hematoma  Lab Results:   Recent Labs  07/28/14 1700  WBC 11.2*  HGB 10.4*  HCT 31.3*  PLT 267   BMET  Recent Labs  07/28/14 1700  NA 139  K 4.8  CL 102  CO2 25  GLUCOSE 136*  BUN 13  CREATININE 0.77  CALCIUM 8.9   PT/INR No results found for this basename: LABPROT, INR,  in the last 72 hours ABG No results found for this basename: PHART, PCO2, PO2, HCO3,  in the last 72 hours  Studies/Results: No results found.  Anti-infectives: Anti-infectives   Start     Dose/Rate Route Frequency Ordered Stop   07/30/14 0800  cephALEXin (KEFLEX) capsule 500 mg     500 mg Oral 4 times per day 07/30/14 0757     07/30/14 0000  cephALEXin (KEFLEX) 500 MG capsule     500 mg Oral Every 6 hours 07/30/14 0802     07/28/14 2200  ceFAZolin (ANCEF) IVPB 1 g/50 mL premix  Status:  Discontinued     1 g 100 mL/hr over 30 Minutes Intravenous 3 times per day 07/28/14 1629 07/30/14 0757   07/28/14 1259  polymyxin B 500,000 Units, bacitracin 50,000 Units in sodium chloride irrigation 0.9 % 500 mL irrigation  Status:  Discontinued       As needed 07/28/14 1259 07/28/14 1507   07/28/14 0857  ceFAZolin (ANCEF) IVPB 2 g/50 mL premix     2 g 100 mL/hr over 30  Minutes Intravenous On call to O.R. 07/28/14 0857 07/28/14 1450      Assessment/Plan: s/p Procedure(s): BILATERAL TOTAL MASTECTOMY WITH LEFT AXILLARY SENTINEL LYMPH NODE BIOPSY (Bilateral) BILATERAL BREAST RECONSTRUCTION WITH PLACEMENT OF TISSUE EXPANDER AND FLEX HD (ACELLULAR HYDRATED DERMIS) (Bilateral) Discharge  LOS: 3 days    Jerimie Mancuso,PA-C Plastic Surgery [phone]

## 2014-07-31 NOTE — Progress Notes (Signed)
Pt ready for discharge to home.  Rxs given and explained.  DC instructions given and explained.  Pt has supplies for JP drains and emptying them.

## 2014-08-01 NOTE — Progress Notes (Signed)
Seen agree with above.

## 2014-08-02 ENCOUNTER — Telehealth: Payer: Self-pay | Admitting: Medical Oncology

## 2014-08-02 ENCOUNTER — Telehealth: Payer: Self-pay | Admitting: Oncology

## 2014-08-02 ENCOUNTER — Encounter (HOSPITAL_COMMUNITY): Payer: Self-pay | Admitting: Anesthesiology

## 2014-08-02 ENCOUNTER — Other Ambulatory Visit: Payer: Self-pay | Admitting: Medical Oncology

## 2014-08-02 NOTE — Progress Notes (Signed)
Agree with the above plan 

## 2014-08-02 NOTE — Telephone Encounter (Signed)
I spoke with pt and she was discharged from hospital. She has an appointment to get her PICC flushed this Friday. POF made for weekly flushes. She will pick up calender when she comes in Friday.

## 2014-08-02 NOTE — Telephone Encounter (Signed)
per pof to sch pt for weekly flush fro pic line-sch -cld & spoke to pt and gave appt time for 9/4-pt stated will come by and get updated sch on 9/4-pt understood

## 2014-08-02 NOTE — Addendum Note (Signed)
Addendum created 08/02/14 0810 by Alexis Frock, MD   Modules edited: Anesthesia Responsible Staff

## 2014-08-02 NOTE — Telephone Encounter (Signed)
per pt to sch flush for pt-pt stated no one has ordered one every week and she has been hospitalized for infection behind not having this cleaned before-sch and gave pt appt-pt asked to be trans to [facility] nurse

## 2014-08-03 DIAGNOSIS — C50919 Malignant neoplasm of unspecified site of unspecified female breast: Secondary | ICD-10-CM

## 2014-08-03 DIAGNOSIS — T80218A Other infection due to central venous catheter, initial encounter: Secondary | ICD-10-CM

## 2014-08-03 DIAGNOSIS — B958 Unspecified staphylococcus as the cause of diseases classified elsewhere: Secondary | ICD-10-CM

## 2014-08-03 DIAGNOSIS — I10 Essential (primary) hypertension: Secondary | ICD-10-CM

## 2014-08-05 NOTE — Addendum Note (Signed)
Addendum created 08/05/14 1401 by Alexis Frock, MD   Modules edited: Anesthesia Responsible Staff

## 2014-08-06 ENCOUNTER — Inpatient Hospital Stay (HOSPITAL_COMMUNITY)
Admission: EM | Admit: 2014-08-06 | Discharge: 2014-08-09 | DRG: 812 | Disposition: A | Discharge status: Home / Self Care | Payer: Medicaid Other | Attending: Internal Medicine | Admitting: Internal Medicine

## 2014-08-06 ENCOUNTER — Ambulatory Visit (HOSPITAL_BASED_OUTPATIENT_CLINIC_OR_DEPARTMENT_OTHER): Payer: Medicaid Other

## 2014-08-06 ENCOUNTER — Encounter (HOSPITAL_COMMUNITY): Payer: Self-pay | Admitting: Emergency Medicine

## 2014-08-06 ENCOUNTER — Emergency Department (HOSPITAL_COMMUNITY): Payer: Medicaid Other

## 2014-08-06 VITALS — BP 112/74 | HR 108 | Temp 98.0°F

## 2014-08-06 DIAGNOSIS — D649 Anemia, unspecified: Secondary | ICD-10-CM | POA: Diagnosis present

## 2014-08-06 DIAGNOSIS — I82409 Acute embolism and thrombosis of unspecified deep veins of unspecified lower extremity: Secondary | ICD-10-CM | POA: Diagnosis present

## 2014-08-06 DIAGNOSIS — Z7901 Long term (current) use of anticoagulants: Secondary | ICD-10-CM

## 2014-08-06 DIAGNOSIS — C773 Secondary and unspecified malignant neoplasm of axilla and upper limb lymph nodes: Secondary | ICD-10-CM

## 2014-08-06 DIAGNOSIS — I82401 Acute embolism and thrombosis of unspecified deep veins of right lower extremity: Secondary | ICD-10-CM

## 2014-08-06 DIAGNOSIS — I82B19 Acute embolism and thrombosis of unspecified subclavian vein: Secondary | ICD-10-CM | POA: Diagnosis present

## 2014-08-06 DIAGNOSIS — D62 Acute posthemorrhagic anemia: Secondary | ICD-10-CM

## 2014-08-06 DIAGNOSIS — N179 Acute kidney failure, unspecified: Secondary | ICD-10-CM

## 2014-08-06 DIAGNOSIS — Z452 Encounter for adjustment and management of vascular access device: Secondary | ICD-10-CM

## 2014-08-06 DIAGNOSIS — Z79899 Other long term (current) drug therapy: Secondary | ICD-10-CM

## 2014-08-06 DIAGNOSIS — Z171 Estrogen receptor negative status [ER-]: Secondary | ICD-10-CM

## 2014-08-06 DIAGNOSIS — Z853 Personal history of malignant neoplasm of breast: Secondary | ICD-10-CM

## 2014-08-06 DIAGNOSIS — I10 Essential (primary) hypertension: Secondary | ICD-10-CM | POA: Diagnosis present

## 2014-08-06 DIAGNOSIS — C50412 Malignant neoplasm of upper-outer quadrant of left female breast: Secondary | ICD-10-CM | POA: Diagnosis present

## 2014-08-06 DIAGNOSIS — C50419 Malignant neoplasm of upper-outer quadrant of unspecified female breast: Secondary | ICD-10-CM | POA: Diagnosis present

## 2014-08-06 DIAGNOSIS — Z87891 Personal history of nicotine dependence: Secondary | ICD-10-CM

## 2014-08-06 DIAGNOSIS — Z1501 Genetic susceptibility to malignant neoplasm of breast: Secondary | ICD-10-CM

## 2014-08-06 DIAGNOSIS — Z9010 Acquired absence of unspecified breast and nipple: Secondary | ICD-10-CM

## 2014-08-06 LAB — CBC WITH DIFFERENTIAL/PLATELET
BASOS ABS: 0 10*3/uL (ref 0.0–0.1)
BASOS PCT: 0 % (ref 0–1)
EOS PCT: 4 % (ref 0–5)
Eosinophils Absolute: 0.4 10*3/uL (ref 0.0–0.7)
HEMATOCRIT: 19.5 % — AB (ref 36.0–46.0)
Hemoglobin: 6.3 g/dL — CL (ref 12.0–15.0)
Lymphocytes Relative: 18 % (ref 12–46)
Lymphs Abs: 1.6 10*3/uL (ref 0.7–4.0)
MCH: 31.7 pg (ref 26.0–34.0)
MCHC: 32.3 g/dL (ref 30.0–36.0)
MCV: 98 fL (ref 78.0–100.0)
MONO ABS: 0.9 10*3/uL (ref 0.1–1.0)
Monocytes Relative: 10 % (ref 3–12)
NEUTROS ABS: 5.8 10*3/uL (ref 1.7–7.7)
Neutrophils Relative %: 67 % (ref 43–77)
Platelets: 402 10*3/uL — ABNORMAL HIGH (ref 150–400)
RBC: 1.99 MIL/uL — ABNORMAL LOW (ref 3.87–5.11)
RDW: 15.5 % (ref 11.5–15.5)
WBC: 8.6 10*3/uL (ref 4.0–10.5)

## 2014-08-06 LAB — BASIC METABOLIC PANEL
ANION GAP: 12 (ref 5–15)
BUN: 15 mg/dL (ref 6–23)
CALCIUM: 9 mg/dL (ref 8.4–10.5)
CO2: 25 mEq/L (ref 19–32)
CREATININE: 0.96 mg/dL (ref 0.50–1.10)
Chloride: 100 mEq/L (ref 96–112)
GFR calc non Af Amer: 72 mL/min — ABNORMAL LOW (ref >90)
GFR, EST AFRICAN AMERICAN: 84 mL/min — AB (ref >90)
Glucose, Bld: 120 mg/dL — ABNORMAL HIGH (ref 70–99)
Potassium: 3.8 mEq/L (ref 3.7–5.3)
Sodium: 137 mEq/L (ref 137–147)

## 2014-08-06 LAB — TROPONIN I: Troponin I: <0.3 ng/mL (ref <0.30)

## 2014-08-06 MED ORDER — ENOXAPARIN SODIUM 100 MG/ML ~~LOC~~ SOLN
90.0000 mg | Freq: Once | SUBCUTANEOUS | Status: AC
  Administered 2014-08-06: 90 mg via SUBCUTANEOUS
  Filled 2014-08-06: qty 1

## 2014-08-06 MED ORDER — SODIUM CHLORIDE 0.9 % IJ SOLN
10.0000 mL | INTRAMUSCULAR | Status: DC | PRN
  Administered 2014-08-06: 10 mL via INTRAVENOUS
  Filled 2014-08-06: qty 10

## 2014-08-06 MED ORDER — HEPARIN SOD (PORK) LOCK FLUSH 100 UNIT/ML IV SOLN
500.0000 [IU] | Freq: Once | INTRAVENOUS | Status: AC
  Administered 2014-08-06: 250 [IU] via INTRAVENOUS
  Filled 2014-08-06: qty 5

## 2014-08-06 NOTE — ED Notes (Signed)
HGB 6.23, Dr. Tomi Bamberger and Jearld Lesch, RN notified

## 2014-08-06 NOTE — ED Notes (Addendum)
Pt reports chest pain is on L side, "right where my heart is." Describes pain as sharp and tight. Pain also to R wrist to forearm, not tender to palpation. Pt states tried massaging it earlier but that didn't make any difference. Radial pulse palpable, good cap refill. No swelling noted to forearm. Denies SOB. Nauseated earlier, took Lorazepam for nausea and nausea went away.

## 2014-08-06 NOTE — ED Notes (Signed)
Pt taking oxycodone from home, Dr Stark Jock aware and told her that was fine

## 2014-08-06 NOTE — Patient Instructions (Signed)
PICC Home Guide A peripherally inserted central catheter (PICC) is a long, thin, flexible tube that is inserted into a vein in the upper arm. It is a form of intravenous (IV) access. It is considered to be a "central" line because the tip of the PICC ends in a large vein in your chest. This large vein is called the superior vena cava (SVC). The PICC tip ends in the SVC because there is a lot of blood flow in the SVC. This allows medicines and IV fluids to be quickly distributed throughout the body. The PICC is inserted using a sterile technique by a specially trained nurse or physician. After the PICC is inserted, a chest X-ray exam is done to be sure it is in the correct place.  A PICC may be placed for different reasons, such as:  To give medicines and liquid nutrition that can only be given through a central line. Examples are:  Certain antibiotic treatments.  Chemotherapy.  Total parenteral nutrition (TPN).  To take frequent blood samples.  To give IV fluids and blood products.  If there is difficulty placing a peripheral intravenous (PIV) catheter. If taken care of properly, a PICC can remain in place for several months. A PICC can also allow a person to go home from the hospital early. Medicine and PICC care can be managed at home by a family member or home health care team. WHAT PROBLEMS CAN HAPPEN WHEN I HAVE A PICC? Problems with a PICC can occasionally occur. These may include the following:  A blood clot (thrombus) forming in or at the tip of the PICC. This can cause the PICC to become clogged. A clot-dissolving medicine called tissue plasminogen activator (tPA) can be given through the PICC to help break up the clot.  Inflammation of the vein (phlebitis) in which the PICC is placed. Signs of inflammation may include redness, pain at the insertion site, red streaks, or being able to feel a "cord" in the vein where the PICC is located.  Infection in the PICC or at the insertion  site. Signs of infection may include fever, chills, redness, swelling, or pus drainage from the PICC insertion site.  PICC movement (malposition). The PICC tip may move from its original position due to excessive physical activity, forceful coughing, sneezing, or vomiting.  A break or cut in the PICC. It is important to not use scissors near the PICC.  Nerve or tendon irritation or injury during PICC insertion. WHAT SHOULD I KEEP IN MIND ABOUT ACTIVITIES WHEN I HAVE A PICC?  You may bend your arm and move it freely. If your PICC is near or at the bend of your elbow, avoid activity with repeated motion at the elbow.  Rest at home for the remainder of the day following PICC line insertion.  Avoid lifting heavy objects as instructed by your health care provider.  Avoid using a crutch with the arm on the same side as your PICC. You may need to use a walker. WHAT SHOULD I KNOW ABOUT MY PICC DRESSING?  Keep your PICC bandage (dressing) clean and dry to prevent infection.  Ask your health care provider when you may shower. Ask your health care provider to teach you how to wrap the PICC when you do take a shower.  Change the PICC dressing as instructed by your health care provider.  Change your PICC dressing if it becomes loose or wet. WHAT SHOULD I KNOW ABOUT PICC CARE?  Check the PICC insertion site   daily for leakage, redness, swelling, or pain.  Do not take a bath, swim, or use hot tubs when you have a PICC. Cover PICC line with clear plastic wrap and tape to keep it dry while showering.  Flush the PICC as directed by your health care provider. Let your health care provider know right away if the PICC is difficult to flush or does not flush. Do not use force to flush the PICC.  Do not use a syringe that is less than 10 mL to flush the PICC.  Never pull or tug on the PICC.  Avoid blood pressure checks on the arm with the PICC.  Keep your PICC identification card with you at all  times.  Do not take the PICC out yourself. Only a trained clinical professional should remove the PICC. SEEK IMMEDIATE MEDICAL CARE IF:  Your PICC is accidentally pulled all the way out. If this happens, cover the insertion site with a bandage or gauze dressing. Do not throw the PICC away. Your health care provider will need to inspect it.  Your PICC was tugged or pulled and has partially come out. Do not  push the PICC back in.  There is any type of drainage, redness, or swelling where the PICC enters the skin.  You cannot flush the PICC, it is difficult to flush, or the PICC leaks around the insertion site when it is flushed.  You hear a "flushing" sound when the PICC is flushed.  You have pain, discomfort, or numbness in your arm, shoulder, or jaw on the same side as the PICC.  You feel your heart "racing" or skipping beats.  You notice a hole or tear in the PICC.  You develop chills or a fever. MAKE SURE YOU:   Understand these instructions.  Will watch your condition.  Will get help right away if you are not doing well or get worse. Document Released: 05/26/2003 Document Revised: 04/05/2014 Document Reviewed: 07/27/2013 [employer] Patient Information 2015 [employer]. This information is not intended to replace advice given to you by your health care provider. Make sure you discuss any questions you have with your health care provider.  

## 2014-08-06 NOTE — ED Notes (Signed)
EKG given to EDP Delo for review

## 2014-08-06 NOTE — ED Provider Notes (Signed)
CSN: [id number]     Arrival date & time 08/06/14  2141 History   First MD Initiated Contact with Patient 08/06/14 2255     Chief Complaint  Patient presents with  . Chest Pain     (Consider location/radiation/quality/duration/timing/severity/associated sxs/prior Treatment) HPI Comments: Patient is a 43 year old female with history of breast cancer status post bilateral mastectomy approximately 2 weeks ago. She saw her plastic surgeon, Dr. Migdalia Dk earlier today and had blood clots expressed from her breast tissue into the JP drains. Shortly afterward she developed sharp pain in the center of her chest that was not associated with nausea, diaphoresis, shortness of breath, but did radiate into her right arm. She is currently receiving Lovenox injections.  Patient is a 43 y.o. female presenting with chest pain. The history is provided by the patient.  Chest Pain Chest pain location: Anterior chest wall. Pain quality: sharp   Pain radiates to:  Does not radiate Pain radiates to the back: no   Pain severity:  Moderate Onset quality:  Sudden Duration:  7 hours Timing:  Intermittent Progression:  Partially resolved Chronicity:  New Context: movement   Relieved by:  Nothing Worsened by:  Deep breathing, movement and certain positions Ineffective treatments:  None tried   Past Medical History  Diagnosis Date  . Endometriosis   . Hypertension   . Rash     fine rash on abd  . Anemia   . Wears glasses   . 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 Iron deficiency anemia, unspecified 02/25/2014  . BRCA1 positive     c.190T>G (p.Cys64Gly) @ [facility]   . Shortness of breath      when Hemoglobin low  . History of blood transfusion   . DVT (deep venous thrombosis)     Right Subclavian and IJ.   Past Surgical History  Procedure Laterality Date  . Cervical polypectomy  2010  . Cesarean section      one previous  . Tubal ligation    . Portacath placement Right 02/23/2014    Procedure:  INSERTION PORT-A-CATH;  Surgeon: Joyice Faster. Cornett, MD;  Location: [place];  Service: General;  Laterality: Right;  . Port-a-cath removal N/A 05/21/2014    Procedure: REMOVAL PORT-A-CATH;  Surgeon: Zenovia Jarred, MD;  Location: [place];  Service: General;  Laterality: N/A;  . Tee without cardioversion N/A 05/26/2014    Procedure: TRANSESOPHAGEAL ECHOCARDIOGRAM (TEE);  Surgeon: Larey Dresser, MD;  Location: [facility] ENDOSCOPY;  Service: Cardiovascular;  Laterality: N/A;  . Bilateral total mastectomy with axillary lymph node dissection Bilateral 07/28/2014    Procedure: BILATERAL TOTAL MASTECTOMY WITH LEFT AXILLARY SENTINEL LYMPH NODE BIOPSY;  Surgeon: Stark Klein, MD;  Location: [place];  Service: General;  Laterality: Bilateral;  . Breast reconstruction with placement of tissue expander and flex hd (acellular hydrated dermis) Bilateral 07/28/2014    Procedure: BILATERAL BREAST RECONSTRUCTION WITH PLACEMENT OF TISSUE EXPANDER AND FLEX HD (ACELLULAR HYDRATED DERMIS);  Surgeon: Theodoro Kos, DO;  Location: [place];  Service: Plastics;  Laterality: Bilateral;   Family History  Problem Relation Age of Onset  . Breast cancer Mother 22    currently 11  . Diabetes Father   . Pancreatic cancer Father 37  . Breast cancer Paternal Aunt 54    currently 45; BRCA1 positive  . Stroke Maternal Grandfather   . Cancer Paternal Aunt     unk. primary; deceased 48s  . Breast cancer Cousin     daughter  of unaffected paternal aunt; dx in her 68s   History  Substance Use Topics  . Smoking status: Former Smoker -- 0.25 packs/day for 15 years    Quit date: 02/18/2009  . Smokeless tobacco: Former Systems developer  . Alcohol Use: Yes     Comment: occasional   OB History   Grav Para Term Preterm Abortions TAB SAB Ect Mult Living   _0 Review of Systems  Cardiovascular: Positive for chest pain.  All other systems reviewed and are negative.     Allergies  Compazine  Home Medications    Prior to Admission medications   Medication Sig Start Date End Date Taking? Authorizing Provider  acetaminophen (TYLENOL) 325 MG tablet Take 2 tablets (650 mg total) by mouth every 6 (six) hours as needed for mild pain (or Temp > 100). 07/30/14  Yes Shawn Rayburn, PA-C  carvedilol (COREG) 6.25 MG tablet Take 1 tablet (6.25 mg total) by mouth 2 (two) times daily with a meal. 07/22/14  Yes Jolaine Artist, MD  cephALEXin (KEFLEX) 500 MG capsule Take 1 capsule (500 mg total) by mouth every 6 (six) hours. 07/30/14  Yes Shawn Rayburn, PA-C  cloNIDine (CATAPRES) 0.2 MG tablet Take 0.1 mg by mouth 2 (two) times daily.   Yes Historical Provider, MD  cyclobenzaprine (FLEXERIL) 10 MG tablet Take 1 tablet (10 mg total) by mouth 3 (three) times daily as needed for muscle spasms. 07/31/14  Yes Shawn Rayburn, PA-C  diazepam (VALIUM) 2 MG tablet Take 2 mg by mouth every 6 (six) hours as needed for anxiety or muscle spasms.   Yes Historical Provider, MD  diphenhydrAMINE (BENADRYL) 25 mg capsule Take 25 mg by mouth every 6 (six) hours as needed for itching.   Yes Historical Provider, MD  docusate sodium 100 MG CAPS Take 100 mg by mouth 2 (two) times daily. 07/30/14  Yes Shawn Rayburn, PA-C  enoxaparin (LOVENOX) 100 MG/ML injection Inject 95 mg into the skin every 12 (twelve) hours.   Yes Historical Provider, MD  ferrous sulfate 325 (65 FE) MG tablet Take 325 mg by mouth 3 (three) times daily with meals.   Yes Historical Provider, MD  ibuprofen (ADVIL,MOTRIN) 600 MG tablet Take 600 mg by mouth every 6 (six) hours as needed for mild pain.   Yes Historical Provider, MD  lidocaine-prilocaine (EMLA) cream Apply 1 application topically as needed (For port-a-cath.). 07/13/14  Yes [name], MD  ondansetron (ZOFRAN) 8 MG tablet Take 8 mg by mouth 2 (two) times daily.   Yes Historical Provider, MD  oxyCODONE (OXY IR/ROXICODONE) 5 MG immediate release tablet Take 15-20 mg by mouth every 4 (four) hours as needed for  severe pain.   Yes Historical Provider, MD  OxyCODONE (OXYCONTIN) 20 mg T12A 12 hr tablet Take 20 mg by mouth every 12 (twelve) hours.   Yes Historical Provider, MD  traMADol (ULTRAM) 50 MG tablet Take 50 mg by mouth every 12 (twelve) hours as needed for moderate pain.   Yes Historical Provider, MD  hydrALAZINE (APRESOLINE) 50 MG tablet Take 1 tablet (50 mg total) by mouth every 8 (eight) hours. 07/22/14   Jolaine Artist, MD  lisinopril (PRINIVIL,ZESTRIL) 40 MG tablet Take 40 mg by mouth daily.    Historical Provider, MD  loratadine (CLARITIN) 10 MG tablet Take 10 mg by mouth daily as needed (For inflammation after receiving her injections during chemo.).     Historical Provider,  MD  LORazepam (ATIVAN) 0.5 MG tablet Take 1 tablet (0.5 mg total) by mouth every 6 (six) hours as needed (Nausea or vomiting). 07/05/14   Lennis Marion Downer, MD   SpO2 98% Physical Exam  Nursing note and vitals reviewed. Constitutional: She is oriented to person, place, and time. She appears well-developed and well-nourished. No distress.  HENT:  Head: Normocephalic and atraumatic.  Neck: Normal range of motion. Neck supple.  Cardiovascular: Normal rate and regular rhythm.  Exam reveals no gallop and no friction rub.   No murmur heard. Pulmonary/Chest: Effort normal and breath sounds normal. No respiratory distress. She has no wheezes. She exhibits tenderness.  There is tenderness to palpation in the center of the chest over the sternum. This reproduces her symptoms.  Abdominal: Soft. Bowel sounds are normal. She exhibits no distension. There is no tenderness.  Musculoskeletal: Normal range of motion.  Neurological: She is alert and oriented to person, place, and time.  Skin: Skin is warm and dry. She is not diaphoretic.    ED Course  Procedures (including critical care time) Labs Review Labs Reviewed  CBC WITH DIFFERENTIAL  BASIC METABOLIC PANEL    Imaging Review No results found.   Date: 08/07/2014   Rate: 77  Rhythm: normal sinus rhythm  QRS Axis: normal  Intervals: normal  ST/T Wave abnormalities: nonspecific T wave changes  Conduction Disutrbances:none  Narrative Interpretation:   Old EKG Reviewed: unchanged    MDM   Final diagnoses:  None    Patient presents with complaints of chest pain following manipulation of her JP drains which are in place following her mastectomy. This was done by Dr. Migdalia Dk in the office this afternoon. Workup reveals an unchanged EKG , however CBC reveals a hemoglobin of 6.4. She has been transfused in the past and I suspect will require additional transfusion. I've spoken with Dr. Roel Cluck who agrees to admit the patient. She would like to have surgery involved. I've discussed the case with Dr. Barkley Bruns who recommends consultation with plastic surgery in the morning following transfusion.    Veryl Speak, MD 08/07/14 [phone]

## 2014-08-06 NOTE — ED Notes (Signed)
Pt had R side breast procedure done earlier today, they found a lot of clots, manipulated the tissue to get clots out through wound vac. At end of procedure pt c/o R side chest pain radiating down R arm. Denies SOB, dizziness, n/v. No diaphoresis. Pain worse with movement. NSR on cardiac monitor. Given 324 mg aspirin en route.

## 2014-08-06 NOTE — ED Notes (Signed)
Bed: QQ76 Expected date:  Expected time:  Means of arrival:  Comments: EMS/R chest pain/post-op breast surgery

## 2014-08-07 ENCOUNTER — Encounter (HOSPITAL_COMMUNITY): Payer: Self-pay | Admitting: Internal Medicine

## 2014-08-07 DIAGNOSIS — Z86718 Personal history of other venous thrombosis and embolism: Secondary | ICD-10-CM

## 2014-08-07 DIAGNOSIS — C50419 Malignant neoplasm of upper-outer quadrant of unspecified female breast: Secondary | ICD-10-CM | POA: Diagnosis present

## 2014-08-07 DIAGNOSIS — I82B19 Acute embolism and thrombosis of unspecified subclavian vein: Secondary | ICD-10-CM | POA: Diagnosis present

## 2014-08-07 DIAGNOSIS — I10 Essential (primary) hypertension: Secondary | ICD-10-CM | POA: Diagnosis present

## 2014-08-07 DIAGNOSIS — I82409 Acute embolism and thrombosis of unspecified deep veins of unspecified lower extremity: Secondary | ICD-10-CM

## 2014-08-07 DIAGNOSIS — R079 Chest pain, unspecified: Secondary | ICD-10-CM | POA: Diagnosis present

## 2014-08-07 DIAGNOSIS — Z79899 Other long term (current) drug therapy: Secondary | ICD-10-CM | POA: Diagnosis not present

## 2014-08-07 DIAGNOSIS — D62 Acute posthemorrhagic anemia: Secondary | ICD-10-CM | POA: Diagnosis present

## 2014-08-07 DIAGNOSIS — D649 Anemia, unspecified: Secondary | ICD-10-CM | POA: Diagnosis present

## 2014-08-07 DIAGNOSIS — Z9010 Acquired absence of unspecified breast and nipple: Secondary | ICD-10-CM | POA: Diagnosis not present

## 2014-08-07 DIAGNOSIS — Z87891 Personal history of nicotine dependence: Secondary | ICD-10-CM | POA: Diagnosis not present

## 2014-08-07 DIAGNOSIS — Z7901 Long term (current) use of anticoagulants: Secondary | ICD-10-CM | POA: Diagnosis not present

## 2014-08-07 DIAGNOSIS — Z1501 Genetic susceptibility to malignant neoplasm of breast: Secondary | ICD-10-CM | POA: Diagnosis not present

## 2014-08-07 DIAGNOSIS — M79609 Pain in unspecified limb: Secondary | ICD-10-CM

## 2014-08-07 LAB — CBC
HEMATOCRIT: 19.6 % — AB (ref 36.0–46.0)
HEMATOCRIT: 24.9 % — AB (ref 36.0–46.0)
Hemoglobin: 6.3 g/dL — CL (ref 12.0–15.0)
Hemoglobin: 8.2 g/dL — ABNORMAL LOW (ref 12.0–15.0)
MCH: 32 pg (ref 26.0–34.0)
MCH: 31.5 pg (ref 26.0–34.0)
MCHC: 32.1 g/dL (ref 30.0–36.0)
MCHC: 32.9 g/dL (ref 30.0–36.0)
MCV: 99.5 fL (ref 78.0–100.0)
MCV: 95.8 fL (ref 78.0–100.0)
Platelets: 372 10*3/uL (ref 150–400)
Platelets: 449 10*3/uL — ABNORMAL HIGH (ref 150–400)
RBC: 1.97 MIL/uL — ABNORMAL LOW (ref 3.87–5.11)
RBC: 2.6 MIL/uL — AB (ref 3.87–5.11)
RDW: 15.7 % — AB (ref 11.5–15.5)
RDW: 16.4 % — ABNORMAL HIGH (ref 11.5–15.5)
WBC: 8.1 10*3/uL (ref 4.0–10.5)
WBC: 8.7 10*3/uL (ref 4.0–10.5)

## 2014-08-07 LAB — PREPARE RBC (CROSSMATCH)

## 2014-08-07 LAB — COMPREHENSIVE METABOLIC PANEL
ALK PHOS: 57 U/L (ref 39–117)
ALT: 9 U/L (ref 0–35)
AST: 11 U/L (ref 0–37)
Albumin: 2.7 g/dL — ABNORMAL LOW (ref 3.5–5.2)
Anion gap: 11 (ref 5–15)
BUN: 16 mg/dL (ref 6–23)
CHLORIDE: 99 meq/L (ref 96–112)
CO2: 26 mEq/L (ref 19–32)
Calcium: 9.1 mg/dL (ref 8.4–10.5)
Creatinine, Ser: 0.91 mg/dL (ref 0.50–1.10)
GFR calc Af Amer: 90 mL/min — ABNORMAL LOW (ref >90)
GFR calc non Af Amer: 77 mL/min — ABNORMAL LOW (ref >90)
Glucose, Bld: 106 mg/dL — ABNORMAL HIGH (ref 70–99)
Potassium: 4.2 mEq/L (ref 3.7–5.3)
SODIUM: 136 meq/L — AB (ref 137–147)
Total Protein: 6.1 g/dL (ref 6.0–8.3)

## 2014-08-07 LAB — TSH: TSH: 7.84 u[IU]/mL — AB (ref 0.350–4.500)

## 2014-08-07 LAB — TROPONIN I
Troponin I: <0.3 ng/mL (ref <0.30)
Troponin I: <0.3 ng/mL (ref <0.30)

## 2014-08-07 LAB — MAGNESIUM: Magnesium: 2 mg/dL (ref 1.5–2.5)

## 2014-08-07 LAB — PHOSPHORUS: PHOSPHORUS: 4.6 mg/dL (ref 2.3–4.6)

## 2014-08-07 MED ORDER — HYDROCODONE-ACETAMINOPHEN 5-325 MG PO TABS
1.0000 | ORAL_TABLET | ORAL | Status: DC | PRN
  Administered 2014-08-07: 2 via ORAL
  Filled 2014-08-07: qty 2

## 2014-08-07 MED ORDER — FERROUS SULFATE 325 (65 FE) MG PO TABS
325.0000 mg | ORAL_TABLET | Freq: Three times a day (TID) | ORAL | Status: DC
  Administered 2014-08-07 – 2014-08-09 (×8): 325 mg via ORAL
  Filled 2014-08-07 (×10): qty 1

## 2014-08-07 MED ORDER — ACETAMINOPHEN 650 MG RE SUPP: 650.0000 mg | Freq: Four times a day (QID) | RECTAL | Status: DC | PRN

## 2014-08-07 MED ORDER — TRAMADOL HCL 50 MG PO TABS: 50.0000 mg | ORAL_TABLET | Freq: Two times a day (BID) | ORAL | Status: DC | PRN

## 2014-08-07 MED ORDER — SODIUM CHLORIDE 0.9 % IV SOLN
Freq: Once | INTRAVENOUS | Status: AC
  Administered 2014-08-07: 02:00:00 via INTRAVENOUS

## 2014-08-07 MED ORDER — ACETAMINOPHEN 325 MG PO TABS
650.0000 mg | ORAL_TABLET | Freq: Four times a day (QID) | ORAL | Status: DC | PRN
  Administered 2014-08-09: 650 mg via ORAL
  Filled 2014-08-07: qty 2

## 2014-08-07 MED ORDER — LORATADINE 10 MG PO TABS
10.0000 mg | ORAL_TABLET | Freq: Every day | ORAL | Status: DC | PRN
  Filled 2014-08-07: qty 1

## 2014-08-07 MED ORDER — CARVEDILOL 6.25 MG PO TABS
6.2500 mg | ORAL_TABLET | Freq: Two times a day (BID) | ORAL | Status: DC
  Administered 2014-08-07 – 2014-08-09 (×5): 6.25 mg via ORAL
  Filled 2014-08-07 (×7): qty 1

## 2014-08-07 MED ORDER — DIAZEPAM 2 MG PO TABS: 2.0000 mg | ORAL_TABLET | Freq: Four times a day (QID) | ORAL | Status: DC | PRN

## 2014-08-07 MED ORDER — SODIUM CHLORIDE 0.9 % IJ SOLN
10.0000 mL | INTRAMUSCULAR | Status: DC | PRN
  Administered 2014-08-07: 10 mL
  Administered 2014-08-07: 20 mL
  Administered 2014-08-08 – 2014-08-09 (×5): 10 mL

## 2014-08-07 MED ORDER — LORAZEPAM 0.5 MG PO TABS: 0.5000 mg | ORAL_TABLET | Freq: Four times a day (QID) | ORAL | Status: DC | PRN

## 2014-08-07 MED ORDER — SODIUM CHLORIDE 0.9 % IJ SOLN
3.0000 mL | Freq: Two times a day (BID) | INTRAMUSCULAR | Status: DC
  Administered 2014-08-07: 3 mL via INTRAVENOUS

## 2014-08-07 MED ORDER — DOCUSATE SODIUM 100 MG PO CAPS
100.0000 mg | ORAL_CAPSULE | Freq: Two times a day (BID) | ORAL | Status: DC
  Administered 2014-08-07 – 2014-08-09 (×6): 100 mg via ORAL
  Filled 2014-08-07 (×7): qty 1

## 2014-08-07 MED ORDER — ONDANSETRON HCL 4 MG PO TABS
4.0000 mg | ORAL_TABLET | Freq: Four times a day (QID) | ORAL | Status: DC | PRN
  Administered 2014-08-08: 4 mg via ORAL
  Filled 2014-08-07: qty 1

## 2014-08-07 MED ORDER — OXYCODONE HCL 5 MG PO TABS
15.0000 mg | ORAL_TABLET | ORAL | Status: DC | PRN
  Administered 2014-08-07 – 2014-08-08 (×3): 20 mg via ORAL
  Filled 2014-08-07 (×2): qty 4
  Filled 2014-08-07: qty 3
  Filled 2014-08-07: qty 4

## 2014-08-07 MED ORDER — OXYCODONE HCL ER 20 MG PO T12A
20.0000 mg | EXTENDED_RELEASE_TABLET | Freq: Two times a day (BID) | ORAL | Status: DC
  Administered 2014-08-07 – 2014-08-09 (×5): 20 mg via ORAL
  Filled 2014-08-07 (×5): qty 1

## 2014-08-07 MED ORDER — SODIUM CHLORIDE 0.9 % IV SOLN: 10.0000 mL/h | Freq: Once | INTRAVENOUS | Status: DC

## 2014-08-07 MED ORDER — ONDANSETRON HCL 4 MG/2ML IJ SOLN: 4.0000 mg | Freq: Four times a day (QID) | INTRAMUSCULAR | Status: DC | PRN

## 2014-08-07 MED ORDER — CYCLOBENZAPRINE HCL 10 MG PO TABS
10.0000 mg | ORAL_TABLET | Freq: Three times a day (TID) | ORAL | Status: DC | PRN
  Filled 2014-08-07: qty 1

## 2014-08-07 MED ORDER — CEPHALEXIN 500 MG PO CAPS
500.0000 mg | ORAL_CAPSULE | Freq: Four times a day (QID) | ORAL | Status: DC
  Administered 2014-08-07 – 2014-08-09 (×11): 500 mg via ORAL
  Filled 2014-08-07 (×14): qty 1

## 2014-08-07 MED ORDER — MORPHINE SULFATE 2 MG/ML IJ SOLN: 1.0000 mg | INTRAMUSCULAR | Status: DC | PRN

## 2014-08-07 MED ORDER — ACETAMINOPHEN 325 MG PO TABS
650.0000 mg | ORAL_TABLET | Freq: Once | ORAL | Status: AC
  Administered 2014-08-07: 650 mg via ORAL
  Filled 2014-08-07: qty 2

## 2014-08-07 MED ORDER — CLONIDINE HCL 0.1 MG PO TABS
0.1000 mg | ORAL_TABLET | Freq: Two times a day (BID) | ORAL | Status: DC
  Administered 2014-08-07 – 2014-08-09 (×5): 0.1 mg via ORAL
  Filled 2014-08-07 (×6): qty 1

## 2014-08-07 MED ORDER — DIPHENHYDRAMINE HCL 25 MG PO CAPS
25.0000 mg | ORAL_CAPSULE | Freq: Once | ORAL | Status: AC
  Administered 2014-08-07: 25 mg via ORAL
  Filled 2014-08-07: qty 1

## 2014-08-07 NOTE — Progress Notes (Signed)
*  Preliminary Results* Right upper extremity venous duplex completed. 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  Preliminary results discussed with Vanita Ingles, RN.  08/07/2014 9:28 AM  Maudry Mayhew, RVT, RDCS, RDMS

## 2014-08-07 NOTE — H&P (Signed)
PCP:  Angelica Chessman, MD  [facility]  Chief Complaint:  Chest pain  HPI: Tamara [name] is a 43 y.o. female   has a past medical history of Endometriosis; Hypertension; Rash; Anemia; Wears glasses; Breast cancer (02/01/14); Iron deficiency anemia, unspecified (02/25/2014); BRCA1 positive; Shortness of breath; History of blood transfusion; and DVT (deep venous thrombosis).   On august 26 she had double mastectomy for hx of breast cancer and BRCA1 she was doing well. On 9/4 she went to see for check up to plastic surgery and was found to have large amount of blood clots in the wound that the surgeon has removed. She felt tired and was having sharp pain in her chest. Patient presented to ER and was found to have hg of 6.3. And HR of 108. Last Hg was 10.4 on the 8/26. Denies any blood in stool, no heavy periods.   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 In ER she was given a dose of lovenox prior to her labs being obtained.  Hospitalist was called for admission for symptomatic anemia  Review of Systems:    Pertinent positives include:  Right arm feels tingly, chest pain, shortness of breath at rest, fatigue,   Constitutional:  No weight loss, night sweats, Fevers, chillsweight loss  HEENT:  No headaches, Difficulty swallowing,Tooth/dental problems,Sore throat,  No sneezing, itching, ear ache, nasal congestion, post nasal drip,  Cardio-vascular:  No Orthopnea, PND, anasarca, dizziness, palpitations.no Bilateral lower extremity swelling  GI:  No heartburn, indigestion, abdominal pain, nausea, vomiting, diarrhea, change in bowel habits, loss of appetite, melena, blood in stool, hematemesis Resp:  no t. No dyspnea on exertion, No excess mucus, no productive cough, No non-productive cough, No coughing up of blood.No change in color of mucus.No wheezing. Skin:  no rash or lesions. No jaundice GU:  no dysuria, change in color of  urine, no urgency or frequency. No straining to urinate.  No flank pain.  Musculoskeletal:  No joint pain or no joint swelling. No decreased range of motion. No back pain.  Psych:  No change in mood or affect. No depression or anxiety. No memory loss.  Neuro: no localizing neurological complaints, no tingling, no weakness, no double vision, no gait abnormality, no slurred speech, no confusion  Otherwise ROS are negative except for above, 10 systems were reviewed  Past Medical History: Past Medical History  Diagnosis Date  . Endometriosis   . Hypertension   . Rash     fine rash on abd  . Anemia   . Wears glasses   . 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 Iron deficiency anemia, unspecified 02/25/2014  . BRCA1 positive     c.190T>G (p.Cys64Gly) @ [facility]   . Shortness of breath      when Hemoglobin low  . History of blood transfusion   . DVT (deep venous thrombosis)     Right Subclavian and IJ.   Past Surgical History  Procedure Laterality Date  . Cervical polypectomy  2010  . Cesarean section      one previous  . Tubal ligation    . Portacath placement Right 02/23/2014    Procedure: INSERTION PORT-A-CATH;  Surgeon: Joyice Faster. Cornett, MD;  Location: [place];  Service: General;  Laterality: Right;  . Port-a-cath removal N/A 05/21/2014    Procedure: REMOVAL PORT-A-CATH;  Surgeon: Zenovia Jarred, MD;  Location: [place];  Service: General;  Laterality:  N/A;  Darden Dates without cardioversion N/A 05/26/2014    Procedure: TRANSESOPHAGEAL ECHOCARDIOGRAM (TEE);  Surgeon: Larey Dresser, MD;  Location: [facility];  Service: Cardiovascular;  Laterality: N/A;  . Bilateral total mastectomy with axillary lymph node dissection Bilateral 07/28/2014    Procedure: BILATERAL TOTAL MASTECTOMY WITH LEFT AXILLARY SENTINEL LYMPH NODE BIOPSY;  Surgeon: Stark Klein, MD;  Location: [place];  Service: General;  Laterality: Bilateral;  . Breast reconstruction with placement of tissue  expander and flex hd (acellular hydrated dermis) Bilateral 07/28/2014    Procedure: BILATERAL BREAST RECONSTRUCTION WITH PLACEMENT OF TISSUE EXPANDER AND FLEX HD (ACELLULAR HYDRATED DERMIS);  Surgeon: Theodoro Kos, DO;  Location: [place];  Service: Plastics;  Laterality: Bilateral;     Medications: Prior to Admission medications   Medication Sig Start Date End Date Taking? Authorizing Provider  acetaminophen (TYLENOL) 325 MG tablet Take 2 tablets (650 mg total) by mouth every 6 (six) hours as needed for mild pain (or Temp > 100). 07/30/14  Yes Shawn Rayburn, PA-C  carvedilol (COREG) 6.25 MG tablet Take 1 tablet (6.25 mg total) by mouth 2 (two) times daily with a meal. 07/22/14  Yes Jolaine Artist, MD  cephALEXin (KEFLEX) 500 MG capsule Take 1 capsule (500 mg total) by mouth every 6 (six) hours. 07/30/14  Yes Shawn Rayburn, PA-C  cloNIDine (CATAPRES) 0.2 MG tablet Take 0.1 mg by mouth 2 (two) times daily.   Yes Historical Provider, MD  cyclobenzaprine (FLEXERIL) 10 MG tablet Take 1 tablet (10 mg total) by mouth 3 (three) times daily as needed for muscle spasms. 07/31/14  Yes Shawn Rayburn, PA-C  diazepam (VALIUM) 2 MG tablet Take 2 mg by mouth every 6 (six) hours as needed for anxiety or muscle spasms.   Yes Historical Provider, MD  diphenhydrAMINE (BENADRYL) 25 mg capsule Take 25 mg by mouth every 6 (six) hours as needed for itching.   Yes Historical Provider, MD  docusate sodium 100 MG CAPS Take 100 mg by mouth 2 (two) times daily. 07/30/14  Yes Shawn Rayburn, PA-C  enoxaparin (LOVENOX) 100 MG/ML injection Inject 95 mg into the skin every 12 (twelve) hours.   Yes Historical Provider, MD  ferrous sulfate 325 (65 FE) MG tablet Take 325 mg by mouth 3 (three) times daily with meals.   Yes Historical Provider, MD  ibuprofen (ADVIL,MOTRIN) 600 MG tablet Take 600 mg by mouth every 6 (six) hours as needed for mild pain.   Yes Historical Provider, MD  lidocaine-prilocaine (EMLA) cream Apply 1 application  topically as needed (For port-a-cath.). 07/13/14  Yes [name], MD  ondansetron (ZOFRAN) 8 MG tablet Take 8 mg by mouth 2 (two) times daily.   Yes Historical Provider, MD  oxyCODONE (OXY IR/ROXICODONE) 5 MG immediate release tablet Take 15-20 mg by mouth every 4 (four) hours as needed for severe pain.   Yes Historical Provider, MD  OxyCODONE (OXYCONTIN) 20 mg T12A 12 hr tablet Take 20 mg by mouth every 12 (twelve) hours.   Yes Historical Provider, MD  traMADol (ULTRAM) 50 MG tablet Take 50 mg by mouth every 12 (twelve) hours as needed for moderate pain.   Yes Historical Provider, MD  hydrALAZINE (APRESOLINE) 50 MG tablet Take 1 tablet (50 mg total) by mouth every 8 (eight) hours. 07/22/14   Jolaine Artist, MD  lisinopril (PRINIVIL,ZESTRIL) 40 MG tablet Take 40 mg by mouth daily.    Historical Provider, MD  loratadine (CLARITIN) 10 MG tablet Take 10 mg by mouth daily  as needed (For inflammation after receiving her injections during chemo.).     Historical Provider, MD  LORazepam (ATIVAN) 0.5 MG tablet Take 1 tablet (0.5 mg total) by mouth every 6 (six) hours as needed (Nausea or vomiting). 07/05/14   Lennis Marion Downer, MD    Allergies:   Allergies  Allergen Reactions  . Compazine [Prochlorperazine Edisylate] Other (See Comments)    Stuttering    Social History:  Ambulatory  independently   Lives at home  With family     reports that she quit smoking about 5 years ago. She has quit using smokeless tobacco. She reports that she drinks alcohol. She reports that she does not use illicit drugs.    Family History: family history includes Breast cancer in her cousin; Breast cancer (age of onset: 66) in her paternal aunt; Breast cancer (age of onset: 27) in her mother; Cancer in her paternal aunt; Diabetes in her father; Pancreatic cancer (age of onset: 63) in her father; Stroke in her maternal grandfather.    Physical Exam: Patient Vitals for the past 24 hrs:  SpO2  08/06/14 2144  98 %    1. General:  in No Acute distress 2. Psychological: Alert and  Oriented 3. Head/ENT:   Moist  Mucous Membranes pale                          Head Non traumatic, neck supple                          Normal   Dentition 4. SKIN: normal  Skin turgor,  Skin clean Dry and intact no rash 5. Heart: Regular rate and rhythm no Murmur, Rub or gallop 6. Lungs: Clear to auscultation bilaterally, no wheezes or crackles   7. Abdomen: Soft, non-tender, Non distended 8. Lower extremities: no clubbing, cyanosis, or edema 9. Neurologically Grossly intact, moving all 4 extremities equally 10. MSK: Normal range of motion Chest wall significant for bilateral incisions draining some amount of blood bilaterally JP on the left full, JP on the right with some maroon blood   body mass index is unknown because there is no weight on file.   Labs on Admission:   Recent Labs  08/06/14 2256  NA 137  K 3.8  CL 100  CO2 25  GLUCOSE 120*  BUN 15  CREATININE 0.96  CALCIUM 9.0   No results found for this basename: AST, ALT, ALKPHOS, BILITOT, PROT, ALBUMIN,  in the last 72 hours No results found for this basename: LIPASE, AMYLASE,  in the last 72 hours  Recent Labs  08/06/14 2256  WBC 8.6  NEUTROABS 5.8  HGB 6.3*  HCT 19.5*  MCV 98.0  PLT 402*    Recent Labs  08/06/14 2256  TROPONINI <0.30   No results found for this basename: TSH, T4TOTAL, FREET3, T3FREE, THYROIDAB,  in the last 72 hours No results found for this basename: VITAMINB12, FOLATE, FERRITIN, TIBC, IRON, RETICCTPCT,  in the last 72 hours Lab Results  Component Value Date   HGBA1C 5.0 01/29/2014    The CrCl is unknown because both a height and weight (above a minimum accepted value) are required for this calculation. ABG No results found for this basename: phart, pco2, po2, hco3, tco2, acidbasedef, o2sat     No results found for this basename: DDIMER     Other results:  I have pearsonaly reviewed this: ECG  REPORT  Rate:77  Rhythm: NSR ST&T Change: no ischemia, t wave flattening   BNP (last 3 results)  Recent Labs  05/11/14 1318  PROBNP 173.9*    There were no vitals filed for this visit.   Cultures:    Component Value Date/Time   SDES BLOOD RIGHT ARM 05/22/2014 1409   SPECREQUEST BOTTLES DRAWN AEROBIC AND ANAEROBIC 10CC 05/22/2014 1409   CULT  Value: NO GROWTH 5 DAYS Performed at [facility] 05/22/2014 1409   REPTSTATUS 05/28/2014 FINAL 05/22/2014 1409      Radiological Exams on Admission: Dg Chest 2 View  08/07/2014   CLINICAL DATA:  Right-sided chest pain. Shortness of breath. Recently postop from bilateral mastectomies.  EXAM: CHEST  2 VIEW  COMPARISON:  05/28/2014  FINDINGS: The heart size and mediastinal contours are within normal limits. Both lungs are clear. No evidence of pneumothorax or pleural effusion.  Left arm PICC line is seen in appropriate position. Bilateral breast tissue expanders are seen in place as well as surgical trans. Surgical clips also seen in the axillary regions bilaterally.  IMPRESSION: No active cardiopulmonary disease.   Electronically Signed   By: Earle Gell M.D.   On: 08/07/2014 00:08    Chart has been reviewed  Assessment/Plan  43 yo F with hx of breast CA sp bilateral mastectomy on lovenox for hx of R subclavian DVT in the setting of port infection in July presents with large amount of bloody drainage from the wounds and symptomatic anemia  Present on Admission:  . Acute blood loss anemia - transfuse 2 units, follow CBC . HTN (hypertension) - restart home meds but hold lisinopril and hydralazine as blood pressure is somewhat soft. Marland Kitchen DVT (deep venous thrombosis) right IJ/subclavian - hold Lovenox for now given active bleeding. Repeat US of upper ext.  . Breast cancer of upper-outer quadrant of left female breast - will be seen by plastic surgery in AM Chest pain in the setting of symptomatic anemia - will transfuse, cycle  CE  Prophylaxis: SCD    CODE STATUS:  FULL CODE    Other plan as per orders.  I have spent a total of 55 min on this admission  Fransisco Messmer 08/07/2014, 12:35 AM  [facility]  Pager [phone]   If 7AM-7PM, please contact the day team taking care of the patient  [email or web address]  Password [email or web address]

## 2014-08-07 NOTE — Progress Notes (Signed)
  Subjective: The patient was seen in my office yesterday for a post operative follow up after bilateral breast reconstruction with expanders and FlexHD.  She had ~ 150-200 cc of clotted dark colored blood coming out of her drain.  It was clotted in the breast pocket and I massaged it to get it out into the drain.  There did not appear to be any bright red blood at any point during the exam.  We placed ~ 50 cc of injectable saline into the expander to help with pressure and fill the area.  Objective: Vital signs in last 24 hours: Temp:  [98 F (36.7 C)-98.5 F (36.9 C)] 98.4 F (36.9 C) (09/05 1128) Pulse Rate:  [75-108] 82 (09/05 1128) Resp:  [16-20] 18 (09/05 1128) BP: (112-157)/(57-85) 156/80 mmHg (09/05 1128) SpO2:  [98 %-100 %] 100 % (09/05 1128) Weight:  [83.915 kg (185 lb)] 83.915 kg (185 lb) (09/05 0117) Last BM Date: 08/04/14  Intake/Output from previous day: 09/04 0701 - 09/05 0700 In: 240 [P.O.:240] Out: 155 [Drains:155] Intake/Output this shift: Total I/O In: 262.5 [I.V.:250; Blood:12.5] Out: -   General appearance: alert, cooperative and no distress Incision/Wound: warm and dry incision sites.  Lab Results:   Recent Labs  08/06/14 2256 08/07/14 0300  WBC 8.6 8.1  HGB 6.3* 6.3*  HCT 19.5* 19.6*  PLT 402* 372   BMET  Recent Labs  08/06/14 2256 08/07/14 0300  NA 137 136*  K 3.8 4.2  CL 100 99  CO2 25 26  GLUCOSE 120* 106*  BUN 15 16  CREATININE 0.96 0.91  CALCIUM 9.0 9.1   PT/INR No results found for this basename: LABPROT, INR,  in the last 72 hours ABG No results found for this basename: PHART, PCO2, PO2, HCO3,  in the last 72 hours  Studies/Results: Dg Chest 2 View  08/07/2014   CLINICAL DATA:  Right-sided chest pain. Shortness of breath. Recently postop from bilateral mastectomies.  EXAM: CHEST  2 VIEW  COMPARISON:  05/28/2014  FINDINGS: The heart size and mediastinal contours are within normal limits. Both lungs are clear. No evidence of  pneumothorax or pleural effusion.  Left arm PICC line is seen in appropriate position. Bilateral breast tissue expanders are seen in place as well as surgical trans. Surgical clips also seen in the axillary regions bilaterally.  IMPRESSION: No active cardiopulmonary disease.   Electronically Signed   By: Earle Gell M.D.   On: 08/07/2014 00:08    Anti-infectives: Anti-infectives   Start     Dose/Rate Route Frequency Ordered Stop   08/07/14 0130  cephALEXin (KEFLEX) capsule 500 mg     500 mg Oral 4 times per day 08/07/14 0127        Assessment/Plan: s/p * No surgery found * Plan per medicine.  No change in post surgical course.  If there is any site of active bleeding than we will need to go to the OR for exploration.  LOS: 1 day    [facility] 08/07/2014

## 2014-08-07 NOTE — ED Notes (Signed)
100 mL drainage from L JP drain; 25 mL drainage from R JP drain

## 2014-08-07 NOTE — Progress Notes (Signed)
Patient seen earlier today by my colleague Dr. Roel Cluck. Patient seen and examined, and data base reviewed. Acute blood loss anemia secondary to bleeding from recent surgical site. Patient is on anticoagulation for DVT involving the right upper extremity, hold Lovenox.  Tamara [name] Pager: [phone] 08/07/2014, 12:05 PM

## 2014-08-08 LAB — TYPE AND SCREEN
ABO/RH(D): O NEG
Antibody Screen: NEGATIVE
UNIT DIVISION: 0
Unit division: 0

## 2014-08-08 LAB — CBC
HEMATOCRIT: 25.7 % — AB (ref 36.0–46.0)
HEMOGLOBIN: 8.4 g/dL — AB (ref 12.0–15.0)
MCH: 31.2 pg (ref 26.0–34.0)
MCHC: 32.7 g/dL (ref 30.0–36.0)
MCV: 95.5 fL (ref 78.0–100.0)
Platelets: 352 10*3/uL (ref 150–400)
RBC: 2.69 MIL/uL — AB (ref 3.87–5.11)
RDW: 17.8 % — ABNORMAL HIGH (ref 11.5–15.5)
WBC: 7.4 10*3/uL (ref 4.0–10.5)

## 2014-08-08 LAB — BASIC METABOLIC PANEL
Anion gap: 11 (ref 5–15)
BUN: 16 mg/dL (ref 6–23)
CHLORIDE: 103 meq/L (ref 96–112)
CO2: 27 meq/L (ref 19–32)
CREATININE: 1.04 mg/dL (ref 0.50–1.10)
Calcium: 8.8 mg/dL (ref 8.4–10.5)
GFR calc non Af Amer: 66 mL/min — ABNORMAL LOW (ref >90)
GFR, EST AFRICAN AMERICAN: 76 mL/min — AB (ref >90)
GLUCOSE: 105 mg/dL — AB (ref 70–99)
Potassium: 4.4 mEq/L (ref 3.7–5.3)
Sodium: 141 mEq/L (ref 137–147)

## 2014-08-08 LAB — TROPONIN I: Troponin I: <0.3 ng/mL (ref <0.30)

## 2014-08-08 NOTE — Progress Notes (Signed)
Called T. Rogue Bussing to clarify telemetry orders. Telemetry orders were still in place but the tele box had been taken off patient the previous shift. No order was placed to d/c telemetry. Fredirick Maudlin advised to put telemetry box back on for the night and reassess with rounding hospitalist in the am. Will continue to monitor patient. Blanchard Kelch, RN

## 2014-08-08 NOTE — Progress Notes (Signed)
[facility] PROGRESS NOTE   Tamara [name] WLN:[id number] DOB: October 05, 1971 DOA: 08/06/2014 PCP: Angelica Chessman, MD  HPI/Subjective: Has minimal pain around her surgical site, drain has some blood in it.  Assessment/Plan: Active Problems:   Breast cancer of upper-outer quadrant of left female breast   HTN (hypertension)   Acute blood loss anemia   DVT (deep venous thrombosis) right IJ/subclavian   Anemia   Symptomatic anemia    Acute blood loss anemia -Presented with hemoglobin of 6.3, hemoglobin was 10.4 about a week ago. -Status post transfusion of 2 units of packed RBCs, hemoglobin is 8.4 now.   Breast cancer  -Recent surgery with bilateral mastectomy and plans for bilateral obstructive surgery. -Patient still has blood in the drain. -Appreciate Dr. Leafy Ro help.   DVT -Involving the right IJ/subclavian, diagnosed on 05/21/14. -Patient is on Lovenox, currently on hold because of the bleeding from the surgical site. -Will restart Lovenox when it's okay with plastic surgery, drain from right breast still has some fresh blood.   HTN -Home meds restarted, but hold lisinopril and hydralazine as blood pressure is somewhat soft.  Chest pain -In the setting of symptomatic anemia - will transfuse, cycle CE.   Code Status: Full code Family Communication: Plan discussed with the patient. Disposition Plan: Remains inpatient   Consultants:  None  Procedures:  None  Antibiotics:  None   Objective: Filed Vitals:   08/08/14 0605  BP: 142/67  Pulse: 87  Temp: 98.2 F (36.8 C)  Resp: 20    Intake/Output Summary (Last 24 hours) at 08/08/14 1141 Last data filed at 08/08/14 0921  Gross per 24 hour  Intake    985 ml  Output    255 ml  Net    730 ml   Filed Weights   08/07/14 0117  Weight: 83.915 kg (185 lb)    Exam: General: Alert and awake, oriented x3, not in any acute distress. HEENT: anicteric sclera, pupils reactive to light and  accommodation, EOMI CVS: S1-S2 clear, no murmur rubs or gallops Chest: clear to auscultation bilaterally, no wheezing, rales or rhonchi Abdomen: soft nontender, nondistended, normal bowel sounds, no organomegaly Extremities: no cyanosis, clubbing or edema noted bilaterally Neuro: Cranial nerves II-XII intact, no focal neurological deficits  Data Reviewed: Basic Metabolic Panel:  Recent Labs Lab 08/06/14 2256 08/07/14 0300 08/08/14 0410  NA 137 136* 141  K 3.8 4.2 4.4  CL 100 99 103  CO2 25 26 27   GLUCOSE 120* 106* 105*  BUN 15 16 16   CREATININE 0.96 0.91 1.04  CALCIUM 9.0 9.1 8.8  MG  --  2.0  --   PHOS  --  4.6  --    Liver Function Tests:  Recent Labs Lab 08/07/14 0300  AST 11  ALT 9  ALKPHOS 57  BILITOT <0.2*  PROT 6.1  ALBUMIN 2.7*   No results found for this basename: LIPASE, AMYLASE,  in the last 168 hours No results found for this basename: AMMONIA,  in the last 168 hours CBC:  Recent Labs Lab 08/06/14 2256 08/07/14 0300 08/07/14 1258 08/08/14 0410  WBC 8.6 8.1 8.7 7.4  NEUTROABS 5.8  --   --   --   HGB 6.3* 6.3* 8.2* 8.4*  HCT 19.5* 19.6* 24.9* 25.7*  MCV 98.0 99.5 95.8 95.5  PLT 402* 372 449* 352   Cardiac Enzymes:  Recent Labs Lab 08/06/14 2256 08/07/14 0300 08/07/14 1825 08/07/14 2359  TROPONINI <0.30 <0.30 <0.30 <0.30   BNP (last 3  results)  Recent Labs  05/11/14 1318  PROBNP 173.9*   CBG: No results found for this basename: GLUCAP,  in the last 168 hours  Micro No results found for this or any previous visit (from the past 240 hour(s)).   Studies: Dg Chest 2 View  08/07/2014   CLINICAL DATA:  Right-sided chest pain. Shortness of breath. Recently postop from bilateral mastectomies.  EXAM: CHEST  2 VIEW  COMPARISON:  05/28/2014  FINDINGS: The heart size and mediastinal contours are within normal limits. Both lungs are clear. No evidence of pneumothorax or pleural effusion.  Left arm PICC line is seen in appropriate position.  Bilateral breast tissue expanders are seen in place as well as surgical trans. Surgical clips also seen in the axillary regions bilaterally.  IMPRESSION: No active cardiopulmonary disease.   Electronically Signed   By: Earle Gell M.D.   On: 08/07/2014 00:08    Scheduled Meds: . carvedilol  6.25 mg Oral BID WC  . cephALEXin  500 mg Oral 4 times per day  . cloNIDine  0.1 mg Oral BID  . docusate sodium  100 mg Oral BID  . ferrous sulfate  325 mg Oral TID WC  . OxyCODONE  20 mg Oral Q12H  . sodium chloride  3 mL Intravenous Q12H   Continuous Infusions:      Time spent: 35 minutes    [facility]  [facility] Pager [phone] If 7PM-7AM, please contact night-coverage at [email or web address], password [email or web address] 08/08/2014, 11:41 AM  LOS: 2 days

## 2014-08-09 DIAGNOSIS — N179 Acute kidney failure, unspecified: Secondary | ICD-10-CM

## 2014-08-09 DIAGNOSIS — Z853 Personal history of malignant neoplasm of breast: Secondary | ICD-10-CM

## 2014-08-09 LAB — BASIC METABOLIC PANEL
Anion gap: 13 (ref 5–15)
BUN: 14 mg/dL (ref 6–23)
CO2: 26 meq/L (ref 19–32)
Calcium: 9.1 mg/dL (ref 8.4–10.5)
Chloride: 101 mEq/L (ref 96–112)
Creatinine, Ser: 0.97 mg/dL (ref 0.50–1.10)
GFR calc Af Amer: 83 mL/min — ABNORMAL LOW (ref >90)
GFR calc non Af Amer: 72 mL/min — ABNORMAL LOW (ref >90)
Glucose, Bld: 131 mg/dL — ABNORMAL HIGH (ref 70–99)
Potassium: 4.2 mEq/L (ref 3.7–5.3)
SODIUM: 140 meq/L (ref 137–147)

## 2014-08-09 LAB — CBC
HEMATOCRIT: 27.8 % — AB (ref 36.0–46.0)
Hemoglobin: 8.9 g/dL — ABNORMAL LOW (ref 12.0–15.0)
MCH: 31.2 pg (ref 26.0–34.0)
MCHC: 32 g/dL (ref 30.0–36.0)
MCV: 97.5 fL (ref 78.0–100.0)
Platelets: 360 10*3/uL (ref 150–400)
RBC: 2.85 MIL/uL — AB (ref 3.87–5.11)
RDW: 17.6 % — ABNORMAL HIGH (ref 11.5–15.5)
WBC: 8.2 10*3/uL (ref 4.0–10.5)

## 2014-08-09 MED ORDER — HEPARIN SOD (PORK) LOCK FLUSH 100 UNIT/ML IV SOLN
250.0000 [IU] | INTRAVENOUS | Status: AC | PRN
  Administered 2014-08-09: 250 [IU]

## 2014-08-09 MED ORDER — ENOXAPARIN SODIUM 100 MG/ML ~~LOC~~ SOLN: 95.0000 mg | Freq: Two times a day (BID) | SUBCUTANEOUS | Status: DC

## 2014-08-09 NOTE — Discharge Summary (Signed)
Physician Discharge Summary  Tamara [name] HGD:[id number] DOB: 27-Mar-1971 DOA: 08/06/2014  PCP: Angelica Chessman, MD  Admit date: 08/06/2014 Discharge date: 08/09/2014  Time spent: 40* minutes  Recommendations for Outpatient Follow-up:  1. Followup with Dr. Migdalia Dk in the morning. 2. Continue to hold Lovenox till it's okay to restart with Dr. Migdalia Dk. 3. Follow CBC closely as outpatient.  Discharge Diagnoses:  Active Problems:   Breast cancer of upper-outer quadrant of left female breast   HTN (hypertension)   Acute blood loss anemia   DVT (deep venous thrombosis) right IJ/subclavian   Anemia   Symptomatic anemia   Discharge Condition: Stable  Diet recommendation: Heart healthy  Filed Weights   08/07/14 0117  Weight: 83.915 kg (185 lb)    History of present illness:  Tamara [name] is a 43 y.o. female  has a past medical history of Endometriosis; Hypertension; Rash; Anemia; Wears glasses; Breast cancer (02/01/14); Iron deficiency anemia, unspecified (02/25/2014); BRCA1 positive; Shortness of breath; History of blood transfusion; and DVT (deep venous thrombosis).  On august 26 she had double mastectomy for hx of breast cancer and BRCA1 she was doing well. On 9/4 she went to see for check up to plastic surgery and was found to have large amount of blood clots in the wound that the surgeon has removed. She felt tired and was having sharp pain in her chest. Patient presented to ER and was found to have hg of 6.3. And HR of 108. Last Hg was 10.4 on the 8/26. Denies any blood in stool, no heavy periods.  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 In ER she was given a dose of lovenox prior to her labs being obtained.  Hospitalist was called for admission for symptomatic anemia   Hospital Course:   Acute blood loss anemia  -Presented with hemoglobin of 6.3, hemoglobin was 10.4 about a week ago.  -Acute bleeding is around surgical site, patient is on Lovenox (For DVT  treatment) after her bilateral mastectomies. -Status post transfusion of 2 units of packed RBCs, hemoglobin is 8.4 posttransfusion. -On the day of discharge hemoglobin is 8.9.  Breast cancer  -Recent surgery with bilateral mastectomy and plans for bilateral obstructive surgery.  -Patient still has blood in the drain.  -Appreciate Dr. Leafy Ro help.  DVT  -Involving the right IJ/subclavian, diagnosed on 05/21/14.  -Patient is on Lovenox, currently on hold because of the bleeding from the surgical site.  -Old Lovenox, patient is going to see Dr. Migdalia Dk in the office in the morning. -Bilateral drains still has unclotted blood.  HTN  -Home meds restarted, but hold lisinopril and hydralazine as blood pressure is somewhat soft.  Chest pain  -I think it was secondary to recent surgery and blood under the skin. -Pain resolved now. Negative cardiac enzymes   Procedures:  None  Consultations:  PRS, Dr. Migdalia Dk  Discharge Exam: Filed Vitals:   08/09/14 0539  BP: 131/79  Pulse: 75  Temp: 97.6 F (36.4 C)  Resp: 18   General: Alert and awake, oriented x3, not in any acute distress. HEENT: anicteric sclera, pupils reactive to light and accommodation, EOMI CVS: S1-S2 clear, no murmur rubs or gallops Chest: clear to auscultation bilaterally, no wheezing, rales or rhonchi Abdomen: soft nontender, nondistended, normal bowel sounds, no organomegaly Extremities: no cyanosis, clubbing or edema noted bilaterally Neuro: Cranial nerves II-XII intact, no focal neurological deficits  Discharge Instructions You were cared for by a hospitalist during your hospital stay. If  you have any questions about your discharge medications or the care you received while you were in the hospital after you are discharged, you can call the unit and asked to speak with the hospitalist on call if the hospitalist that took care of you is not available. Once you are discharged, your primary care physician will  handle any further medical issues. Please note that NO REFILLS for any discharge medications will be authorized once you are discharged, as it is imperative that you return to your primary care physician (or establish a relationship with a primary care physician if you do not have one) for your aftercare needs so that they can reassess your need for medications and monitor your lab values.  Discharge Instructions   Diet - low sodium heart healthy    Complete by:  As directed      Increase activity slowly    Complete by:  As directed           Current Discharge Medication List    CONTINUE these medications which have CHANGED   Details  enoxaparin (LOVENOX) 100 MG/ML injection Inject 0.95 mLs (95 mg total) into the skin every 12 (twelve) hours. Qty: 0 Syringe      CONTINUE these medications which have NOT CHANGED   Details  acetaminophen (TYLENOL) 325 MG tablet Take 2 tablets (650 mg total) by mouth every 6 (six) hours as needed for mild pain (or Temp > 100).    carvedilol (COREG) 6.25 MG tablet Take 1 tablet (6.25 mg total) by mouth 2 (two) times daily with a meal. Qty: 60 tablet, Refills: 6   Associated Diagnoses: Breast cancer of upper-outer quadrant of left female breast    cloNIDine (CATAPRES) 0.2 MG tablet Take 0.1 mg by mouth 2 (two) times daily.    cyclobenzaprine (FLEXERIL) 10 MG tablet Take 1 tablet (10 mg total) by mouth 3 (three) times daily as needed for muscle spasms. Qty: 30 tablet, Refills: 0    diazepam (VALIUM) 2 MG tablet Take 2 mg by mouth every 6 (six) hours as needed for anxiety or muscle spasms.    diphenhydrAMINE (BENADRYL) 25 mg capsule Take 25 mg by mouth every 6 (six) hours as needed for itching.    docusate sodium 100 MG CAPS Take 100 mg by mouth 2 (two) times daily. Qty: 10 capsule, Refills: 0    ferrous sulfate 325 (65 FE) MG tablet Take 325 mg by mouth 3 (three) times daily with meals.    ibuprofen (ADVIL,MOTRIN) 600 MG tablet Take 600 mg by mouth  every 6 (six) hours as needed for mild pain.    lidocaine-prilocaine (EMLA) cream Apply 1 application topically as needed (For port-a-cath.). Qty: 30 g, Refills: 1   Associated Diagnoses: Breast cancer of upper-outer quadrant of left female breast    ondansetron (ZOFRAN) 8 MG tablet Take 8 mg by mouth 2 (two) times daily.    oxyCODONE (OXY IR/ROXICODONE) 5 MG immediate release tablet Take 15-20 mg by mouth every 4 (four) hours as needed for severe pain.    OxyCODONE (OXYCONTIN) 20 mg T12A 12 hr tablet Take 20 mg by mouth every 12 (twelve) hours.    traMADol (ULTRAM) 50 MG tablet Take 50 mg by mouth every 12 (twelve) hours as needed for moderate pain.    hydrALAZINE (APRESOLINE) 50 MG tablet Take 1 tablet (50 mg total) by mouth every 8 (eight) hours. Qty: 90 tablet, Refills: 6    lisinopril (PRINIVIL,ZESTRIL) 40 MG tablet Take 40 mg  by mouth daily.    loratadine (CLARITIN) 10 MG tablet Take 10 mg by mouth daily as needed (For inflammation after receiving her injections during chemo.).     LORazepam (ATIVAN) 0.5 MG tablet Take 1 tablet (0.5 mg total) by mouth every 6 (six) hours as needed (Nausea or vomiting). Qty: 30 tablet, Refills: 0   Associated Diagnoses: Breast cancer of upper-outer quadrant of left female breast      STOP taking these medications     cephALEXin (KEFLEX) 500 MG capsule        Allergies  Allergen Reactions  . Compazine [Prochlorperazine Edisylate] Other (See Comments)    Stuttering      The results of significant diagnostics from this hospitalization (including imaging, microbiology, ancillary and laboratory) are listed below for reference.    Significant Diagnostic Studies: Dg Chest 2 View  08/07/2014   CLINICAL DATA:  Right-sided chest pain. Shortness of breath. Recently postop from bilateral mastectomies.  EXAM: CHEST  2 VIEW  COMPARISON:  05/28/2014  FINDINGS: The heart size and mediastinal contours are within normal limits. Both lungs are clear. No  evidence of pneumothorax or pleural effusion.  Left arm PICC line is seen in appropriate position. Bilateral breast tissue expanders are seen in place as well as surgical trans. Surgical clips also seen in the axillary regions bilaterally.  IMPRESSION: No active cardiopulmonary disease.   Electronically Signed   By: Earle Gell M.D.   On: 08/07/2014 00:08   Nm Sentinel Node Inj-no Rpt (breast)  07/28/2014   CLINICAL DATA: left breast cancer   Sulfur colloid was injected intradermally by the nuclear medicine  technologist for breast cancer sentinel node localization.     Microbiology: No results found for this or any previous visit (from the past 240 hour(s)).   Labs: Basic Metabolic Panel:  Recent Labs Lab 08/06/14 2256 08/07/14 0300 08/08/14 0410 08/09/14 0625  NA 137 136* 141 140  K 3.8 4.2 4.4 4.2  CL 100 99 103 101  CO2 25 26 27 26   GLUCOSE 120* 106* 105* 131*  BUN 15 16 16 14   CREATININE 0.96 0.91 1.04 0.97  CALCIUM 9.0 9.1 8.8 9.1  MG  --  2.0  --   --   PHOS  --  4.6  --   --    Liver Function Tests:  Recent Labs Lab 08/07/14 0300  AST 11  ALT 9  ALKPHOS 57  BILITOT <0.2*  PROT 6.1  ALBUMIN 2.7*   No results found for this basename: LIPASE, AMYLASE,  in the last 168 hours No results found for this basename: AMMONIA,  in the last 168 hours CBC:  Recent Labs Lab 08/06/14 2256 08/07/14 0300 08/07/14 1258 08/08/14 0410 08/09/14 0625  WBC 8.6 8.1 8.7 7.4 8.2  NEUTROABS 5.8  --   --   --   --   HGB 6.3* 6.3* 8.2* 8.4* 8.9*  HCT 19.5* 19.6* 24.9* 25.7* 27.8*  MCV 98.0 99.5 95.8 95.5 97.5  PLT 402* 372 449* 352 360   Cardiac Enzymes:  Recent Labs Lab 08/06/14 2256 08/07/14 0300 08/07/14 1825 08/07/14 2359  TROPONINI <0.30 <0.30 <0.30 <0.30   BNP: BNP (last 3 results)  Recent Labs  05/11/14 1318  PROBNP 173.9*   CBG: No results found for this basename: GLUCAP,  in the last 168 hours     Signed:  Jamillia Closson A  [facility] 08/09/2014, 1:14 PM

## 2014-08-09 NOTE — Care Management Note (Signed)
    Page 1 of 1   08/09/2014     3:54:15 PM CARE MANAGEMENT NOTE 08/09/2014  Patient:  Tamara [name], Tamara [name]   Account Number:  [id number]  Date Initiated:  08/09/2014  Documentation initiated by:  Dessa Phi  Subjective/Objective Assessment:   43 Y/O F ADMITTED W/NEMIA     Action/Plan:   FROM HOME.   Anticipated DC Date:  08/09/2014   Anticipated DC Plan:  [place]  CM consult      Choice offered to / List presented to:             Status of service:  Completed, signed off Medicare Important Message given?   (If response is "NO", the following Medicare IM given date fields will be blank) Date Medicare IM given:   Medicare IM given by:   Date Additional Medicare IM given:   Additional Medicare IM given by:    Discharge Disposition:  HOME/SELF CARE  Per UR Regulation:  Reviewed for med. necessity/level of care/duration of stay  If discussed at [place] of Stay Meetings, dates discussed:    Comments:  08/09/14 Destinae Neubecker RN,BSN NCM [phone] D/C HOME NO [place].

## 2014-08-11 ENCOUNTER — Telehealth: Payer: Self-pay | Admitting: Dietician

## 2014-08-11 NOTE — Telephone Encounter (Signed)
Brief Outpatient Oncology Nutrition Note  Patient has been identified to be at risk on malnutrition screen.  Wt Readings from Last 10 Encounters:  08/07/14 185 lb (83.915 kg)  07/28/14 199 lb 3.2 oz (90.357 kg)  07/28/14 199 lb 3.2 oz (90.357 kg)  07/22/14 199 lb 3.2 oz (90.357 kg)  07/08/14 195 lb (88.451 kg)  07/05/14 197 lb 3.2 oz (89.449 kg)  07/05/14 196 lb (88.905 kg)  06/30/14 194 lb (87.998 kg)  06/17/14 190 lb 6.4 oz (86.365 kg)  06/11/14 190 lb (86.183 kg)     Dx:  BRCA1 possitive  Called patient due to weight loss.  Patient states that she is eating well and is not concerned as she is overweight.  Discussed goal for continued good intake with no purposeful weight loss but can continue to aim for healthy eating.  Patient with no issues at this time.  Outpatient [place] RD is available as needed.  Antonieta Iba, RD, LDN

## 2014-08-13 ENCOUNTER — Ambulatory Visit (HOSPITAL_BASED_OUTPATIENT_CLINIC_OR_DEPARTMENT_OTHER): Payer: Medicaid Other

## 2014-08-13 DIAGNOSIS — C50419 Malignant neoplasm of upper-outer quadrant of unspecified female breast: Secondary | ICD-10-CM

## 2014-08-13 DIAGNOSIS — Z452 Encounter for adjustment and management of vascular access device: Secondary | ICD-10-CM

## 2014-08-13 MED ORDER — HEPARIN SOD (PORK) LOCK FLUSH 100 UNIT/ML IV SOLN
500.0000 [IU] | Freq: Once | INTRAVENOUS | Status: AC
  Administered 2014-08-13: 250 [IU] via INTRAVENOUS
  Filled 2014-08-13: qty 5

## 2014-08-13 MED ORDER — SODIUM CHLORIDE 0.9 % IJ SOLN
10.0000 mL | INTRAMUSCULAR | Status: DC | PRN
  Administered 2014-08-13: 10 mL via INTRAVENOUS
  Filled 2014-08-13: qty 10

## 2014-08-13 NOTE — Addendum Note (Signed)
Addendum created 08/13/14 1116 by Midge Minium, MD   Modules edited: Anesthesia Responsible Staff

## 2014-08-13 NOTE — Patient Instructions (Signed)
PICC Home Guide A peripherally inserted central catheter (PICC) is a long, thin, flexible tube that is inserted into a vein in the upper arm. It is a form of intravenous (IV) access. It is considered to be a "central" line because the tip of the PICC ends in a large vein in your chest. This large vein is called the superior vena cava (SVC). The PICC tip ends in the SVC because there is a lot of blood flow in the SVC. This allows medicines and IV fluids to be quickly distributed throughout the body. The PICC is inserted using a sterile technique by a specially trained nurse or physician. After the PICC is inserted, a chest X-ray exam is done to be sure it is in the correct place.  A PICC may be placed for different reasons, such as:  To give medicines and liquid nutrition that can only be given through a central line. Examples are:  Certain antibiotic treatments.  Chemotherapy.  Total parenteral nutrition (TPN).  To take frequent blood samples.  To give IV fluids and blood products.  If there is difficulty placing a peripheral intravenous (PIV) catheter. If taken care of properly, a PICC can remain in place for several months. A PICC can also allow a person to go home from the hospital early. Medicine and PICC care can be managed at home by a family member or home health care team. WHAT PROBLEMS CAN HAPPEN WHEN I HAVE A PICC? Problems with a PICC can occasionally occur. These may include the following:  A blood clot (thrombus) forming in or at the tip of the PICC. This can cause the PICC to become clogged. A clot-dissolving medicine called tissue plasminogen activator (tPA) can be given through the PICC to help break up the clot.  Inflammation of the vein (phlebitis) in which the PICC is placed. Signs of inflammation may include redness, pain at the insertion site, red streaks, or being able to feel a "cord" in the vein where the PICC is located.  Infection in the PICC or at the insertion  site. Signs of infection may include fever, chills, redness, swelling, or pus drainage from the PICC insertion site.  PICC movement (malposition). The PICC tip may move from its original position due to excessive physical activity, forceful coughing, sneezing, or vomiting.  A break or cut in the PICC. It is important to not use scissors near the PICC.  Nerve or tendon irritation or injury during PICC insertion. WHAT SHOULD I KEEP IN MIND ABOUT ACTIVITIES WHEN I HAVE A PICC?  You may bend your arm and move it freely. If your PICC is near or at the bend of your elbow, avoid activity with repeated motion at the elbow.  Rest at home for the remainder of the day following PICC line insertion.  Avoid lifting heavy objects as instructed by your health care provider.  Avoid using a crutch with the arm on the same side as your PICC. You may need to use a walker. WHAT SHOULD I KNOW ABOUT MY PICC DRESSING?  Keep your PICC bandage (dressing) clean and dry to prevent infection.  Ask your health care provider when you may shower. Ask your health care provider to teach you how to wrap the PICC when you do take a shower.  Change the PICC dressing as instructed by your health care provider.  Change your PICC dressing if it becomes loose or wet. WHAT SHOULD I KNOW ABOUT PICC CARE?  Check the PICC insertion site   daily for leakage, redness, swelling, or pain.  Do not take a bath, swim, or use hot tubs when you have a PICC. Cover PICC line with clear plastic wrap and tape to keep it dry while showering.  Flush the PICC as directed by your health care provider. Let your health care provider know right away if the PICC is difficult to flush or does not flush. Do not use force to flush the PICC.  Do not use a syringe that is less than 10 mL to flush the PICC.  Never pull or tug on the PICC.  Avoid blood pressure checks on the arm with the PICC.  Keep your PICC identification card with you at all  times.  Do not take the PICC out yourself. Only a trained clinical professional should remove the PICC. SEEK IMMEDIATE MEDICAL CARE IF:  Your PICC is accidentally pulled all the way out. If this happens, cover the insertion site with a bandage or gauze dressing. Do not throw the PICC away. Your health care provider will need to inspect it.  Your PICC was tugged or pulled and has partially come out. Do not  push the PICC back in.  There is any type of drainage, redness, or swelling where the PICC enters the skin.  You cannot flush the PICC, it is difficult to flush, or the PICC leaks around the insertion site when it is flushed.  You hear a "flushing" sound when the PICC is flushed.  You have pain, discomfort, or numbness in your arm, shoulder, or jaw on the same side as the PICC.  You feel your heart "racing" or skipping beats.  You notice a hole or tear in the PICC.  You develop chills or a fever. MAKE SURE YOU:   Understand these instructions.  Will watch your condition.  Will get help right away if you are not doing well or get worse. Document Released: 05/26/2003 Document Revised: 04/05/2014 Document Reviewed: 07/27/2013 [employer] Patient Information 2015 [employer]. This information is not intended to replace advice given to you by your health care provider. Make sure you discuss any questions you have with your health care provider.  

## 2014-08-20 ENCOUNTER — Ambulatory Visit (HOSPITAL_BASED_OUTPATIENT_CLINIC_OR_DEPARTMENT_OTHER): Payer: Medicaid Other

## 2014-08-20 VITALS — Temp 98.5°F

## 2014-08-20 DIAGNOSIS — Z452 Encounter for adjustment and management of vascular access device: Secondary | ICD-10-CM

## 2014-08-20 DIAGNOSIS — C50419 Malignant neoplasm of upper-outer quadrant of unspecified female breast: Secondary | ICD-10-CM

## 2014-08-20 MED ORDER — HEPARIN SOD (PORK) LOCK FLUSH 100 UNIT/ML IV SOLN
500.0000 [IU] | Freq: Once | INTRAVENOUS | Status: AC
  Administered 2014-08-20: 250 [IU] via INTRAVENOUS
  Filled 2014-08-20: qty 5

## 2014-08-20 MED ORDER — SODIUM CHLORIDE 0.9 % IJ SOLN
10.0000 mL | INTRAMUSCULAR | Status: DC | PRN
  Administered 2014-08-20: 10 mL via INTRAVENOUS
  Filled 2014-08-20: qty 10

## 2014-08-20 NOTE — Patient Instructions (Signed)
PICC Home Guide A peripherally inserted central catheter (PICC) is a long, thin, flexible tube that is inserted into a vein in the upper arm. It is a form of intravenous (IV) access. It is considered to be a "central" line because the tip of the PICC ends in a large vein in your chest. This large vein is called the superior vena cava (SVC). The PICC tip ends in the SVC because there is a lot of blood flow in the SVC. This allows medicines and IV fluids to be quickly distributed throughout the body. The PICC is inserted using a sterile technique by a specially trained nurse or physician. After the PICC is inserted, a chest X-ray exam is done to be sure it is in the correct place.  A PICC may be placed for different reasons, such as:  To give medicines and liquid nutrition that can only be given through a central line. Examples are:  Certain antibiotic treatments.  Chemotherapy.  Total parenteral nutrition (TPN).  To take frequent blood samples.  To give IV fluids and blood products.  If there is difficulty placing a peripheral intravenous (PIV) catheter. If taken care of properly, a PICC can remain in place for several months. A PICC can also allow a person to go home from the hospital early. Medicine and PICC care can be managed at home by a family member or home health care team. WHAT PROBLEMS CAN HAPPEN WHEN I HAVE A PICC? Problems with a PICC can occasionally occur. These may include the following:  A blood clot (thrombus) forming in or at the tip of the PICC. This can cause the PICC to become clogged. A clot-dissolving medicine called tissue plasminogen activator (tPA) can be given through the PICC to help break up the clot.  Inflammation of the vein (phlebitis) in which the PICC is placed. Signs of inflammation may include redness, pain at the insertion site, red streaks, or being able to feel a "cord" in the vein where the PICC is located.  Infection in the PICC or at the insertion  site. Signs of infection may include fever, chills, redness, swelling, or pus drainage from the PICC insertion site.  PICC movement (malposition). The PICC tip may move from its original position due to excessive physical activity, forceful coughing, sneezing, or vomiting.  A break or cut in the PICC. It is important to not use scissors near the PICC.  Nerve or tendon irritation or injury during PICC insertion. WHAT SHOULD I KEEP IN MIND ABOUT ACTIVITIES WHEN I HAVE A PICC?  You may bend your arm and move it freely. If your PICC is near or at the bend of your elbow, avoid activity with repeated motion at the elbow.  Rest at home for the remainder of the day following PICC line insertion.  Avoid lifting heavy objects as instructed by your health care provider.  Avoid using a crutch with the arm on the same side as your PICC. You may need to use a walker. WHAT SHOULD I KNOW ABOUT MY PICC DRESSING?  Keep your PICC bandage (dressing) clean and dry to prevent infection.  Ask your health care provider when you may shower. Ask your health care provider to teach you how to wrap the PICC when you do take a shower.  Change the PICC dressing as instructed by your health care provider.  Change your PICC dressing if it becomes loose or wet. WHAT SHOULD I KNOW ABOUT PICC CARE?  Check the PICC insertion site   daily for leakage, redness, swelling, or pain.  Do not take a bath, swim, or use hot tubs when you have a PICC. Cover PICC line with clear plastic wrap and tape to keep it dry while showering.  Flush the PICC as directed by your health care provider. Let your health care provider know right away if the PICC is difficult to flush or does not flush. Do not use force to flush the PICC.  Do not use a syringe that is less than 10 mL to flush the PICC.  Never pull or tug on the PICC.  Avoid blood pressure checks on the arm with the PICC.  Keep your PICC identification card with you at all  times.  Do not take the PICC out yourself. Only a trained clinical professional should remove the PICC. SEEK IMMEDIATE MEDICAL CARE IF:  Your PICC is accidentally pulled all the way out. If this happens, cover the insertion site with a bandage or gauze dressing. Do not throw the PICC away. Your health care provider will need to inspect it.  Your PICC was tugged or pulled and has partially come out. Do not  push the PICC back in.  There is any type of drainage, redness, or swelling where the PICC enters the skin.  You cannot flush the PICC, it is difficult to flush, or the PICC leaks around the insertion site when it is flushed.  You hear a "flushing" sound when the PICC is flushed.  You have pain, discomfort, or numbness in your arm, shoulder, or jaw on the same side as the PICC.  You feel your heart "racing" or skipping beats.  You notice a hole or tear in the PICC.  You develop chills or a fever. MAKE SURE YOU:   Understand these instructions.  Will watch your condition.  Will get help right away if you are not doing well or get worse. Document Released: 05/26/2003 Document Revised: 04/05/2014 Document Reviewed: 07/27/2013 [employer] Patient Information 2015 [employer]. This information is not intended to replace advice given to you by your health care provider. Make sure you discuss any questions you have with your health care provider.  

## 2014-08-27 ENCOUNTER — Ambulatory Visit (HOSPITAL_BASED_OUTPATIENT_CLINIC_OR_DEPARTMENT_OTHER): Payer: Medicaid Other

## 2014-08-27 VITALS — BP 151/85 | HR 78 | Temp 97.9°F

## 2014-08-27 DIAGNOSIS — C50419 Malignant neoplasm of upper-outer quadrant of unspecified female breast: Secondary | ICD-10-CM

## 2014-08-27 DIAGNOSIS — Z452 Encounter for adjustment and management of vascular access device: Secondary | ICD-10-CM

## 2014-08-27 MED ORDER — SODIUM CHLORIDE 0.9 % IJ SOLN
10.0000 mL | INTRAMUSCULAR | Status: DC | PRN
  Administered 2014-08-27: 10 mL via INTRAVENOUS
  Filled 2014-08-27: qty 10

## 2014-08-27 MED ORDER — HEPARIN SOD (PORK) LOCK FLUSH 100 UNIT/ML IV SOLN
500.0000 [IU] | Freq: Once | INTRAVENOUS | Status: AC
  Administered 2014-08-27: 250 [IU] via INTRAVENOUS
  Filled 2014-08-27: qty 5

## 2014-08-27 NOTE — Patient Instructions (Signed)
PICC Home Guide A peripherally inserted central catheter (PICC) is a long, thin, flexible tube that is inserted into a vein in the upper arm. It is a form of intravenous (IV) access. It is considered to be a "central" line because the tip of the PICC ends in a large vein in your chest. This large vein is called the superior vena cava (SVC). The PICC tip ends in the SVC because there is a lot of blood flow in the SVC. This allows medicines and IV fluids to be quickly distributed throughout the body. The PICC is inserted using a sterile technique by a specially trained nurse or physician. After the PICC is inserted, a chest X-ray exam is done to be sure it is in the correct place.  A PICC may be placed for different reasons, such as:  To give medicines and liquid nutrition that can only be given through a central line. Examples are:  Certain antibiotic treatments.  Chemotherapy.  Total parenteral nutrition (TPN).  To take frequent blood samples.  To give IV fluids and blood products.  If there is difficulty placing a peripheral intravenous (PIV) catheter. If taken care of properly, a PICC can remain in place for several months. A PICC can also allow a person to go home from the hospital early. Medicine and PICC care can be managed at home by a family member or home health care team. WHAT PROBLEMS CAN HAPPEN WHEN I HAVE A PICC? Problems with a PICC can occasionally occur. These may include the following:  A blood clot (thrombus) forming in or at the tip of the PICC. This can cause the PICC to become clogged. A clot-dissolving medicine called tissue plasminogen activator (tPA) can be given through the PICC to help break up the clot.  Inflammation of the vein (phlebitis) in which the PICC is placed. Signs of inflammation may include redness, pain at the insertion site, red streaks, or being able to feel a "cord" in the vein where the PICC is located.  Infection in the PICC or at the insertion  site. Signs of infection may include fever, chills, redness, swelling, or pus drainage from the PICC insertion site.  PICC movement (malposition). The PICC tip may move from its original position due to excessive physical activity, forceful coughing, sneezing, or vomiting.  A break or cut in the PICC. It is important to not use scissors near the PICC.  Nerve or tendon irritation or injury during PICC insertion. WHAT SHOULD I KEEP IN MIND ABOUT ACTIVITIES WHEN I HAVE A PICC?  You may bend your arm and move it freely. If your PICC is near or at the bend of your elbow, avoid activity with repeated motion at the elbow.  Rest at home for the remainder of the day following PICC line insertion.  Avoid lifting heavy objects as instructed by your health care provider.  Avoid using a crutch with the arm on the same side as your PICC. You may need to use a walker. WHAT SHOULD I KNOW ABOUT MY PICC DRESSING?  Keep your PICC bandage (dressing) clean and dry to prevent infection.  Ask your health care provider when you may shower. Ask your health care provider to teach you how to wrap the PICC when you do take a shower.  Change the PICC dressing as instructed by your health care provider.  Change your PICC dressing if it becomes loose or wet. WHAT SHOULD I KNOW ABOUT PICC CARE?  Check the PICC insertion site   daily for leakage, redness, swelling, or pain.  Do not take a bath, swim, or use hot tubs when you have a PICC. Cover PICC line with clear plastic wrap and tape to keep it dry while showering.  Flush the PICC as directed by your health care provider. Let your health care provider know right away if the PICC is difficult to flush or does not flush. Do not use force to flush the PICC.  Do not use a syringe that is less than 10 mL to flush the PICC.  Never pull or tug on the PICC.  Avoid blood pressure checks on the arm with the PICC.  Keep your PICC identification card with you at all  times.  Do not take the PICC out yourself. Only a trained clinical professional should remove the PICC. SEEK IMMEDIATE MEDICAL CARE IF:  Your PICC is accidentally pulled all the way out. If this happens, cover the insertion site with a bandage or gauze dressing. Do not throw the PICC away. Your health care provider will need to inspect it.  Your PICC was tugged or pulled and has partially come out. Do not  push the PICC back in.  There is any type of drainage, redness, or swelling where the PICC enters the skin.  You cannot flush the PICC, it is difficult to flush, or the PICC leaks around the insertion site when it is flushed.  You hear a "flushing" sound when the PICC is flushed.  You have pain, discomfort, or numbness in your arm, shoulder, or jaw on the same side as the PICC.  You feel your heart "racing" or skipping beats.  You notice a hole or tear in the PICC.  You develop chills or a fever. MAKE SURE YOU:   Understand these instructions.  Will watch your condition.  Will get help right away if you are not doing well or get worse. Document Released: 05/26/2003 Document Revised: 04/05/2014 Document Reviewed: 07/27/2013 [employer] Patient Information 2015 [employer]. This information is not intended to replace advice given to you by your health care provider. Make sure you discuss any questions you have with your health care provider.  

## 2014-09-01 ENCOUNTER — Encounter (HOSPITAL_COMMUNITY): Payer: Self-pay | Admitting: Emergency Medicine

## 2014-09-01 ENCOUNTER — Emergency Department (HOSPITAL_COMMUNITY): Payer: Medicaid Other

## 2014-09-01 ENCOUNTER — Emergency Department (HOSPITAL_COMMUNITY)
Admission: EM | Admit: 2014-09-01 | Discharge: 2014-09-01 | Disposition: A | Discharge status: Home / Self Care | Payer: Medicaid Other | Attending: Emergency Medicine | Admitting: Emergency Medicine

## 2014-09-01 DIAGNOSIS — D509 Iron deficiency anemia, unspecified: Secondary | ICD-10-CM | POA: Insufficient documentation

## 2014-09-01 DIAGNOSIS — Z8742 Personal history of other diseases of the female genital tract: Secondary | ICD-10-CM | POA: Diagnosis not present

## 2014-09-01 DIAGNOSIS — Z86718 Personal history of other venous thrombosis and embolism: Secondary | ICD-10-CM | POA: Insufficient documentation

## 2014-09-01 DIAGNOSIS — R5383 Other fatigue: Secondary | ICD-10-CM | POA: Diagnosis not present

## 2014-09-01 DIAGNOSIS — Z79899 Other long term (current) drug therapy: Secondary | ICD-10-CM | POA: Insufficient documentation

## 2014-09-01 DIAGNOSIS — R51 Headache: Secondary | ICD-10-CM | POA: Insufficient documentation

## 2014-09-01 DIAGNOSIS — R509 Fever, unspecified: Secondary | ICD-10-CM | POA: Insufficient documentation

## 2014-09-01 DIAGNOSIS — I10 Essential (primary) hypertension: Secondary | ICD-10-CM | POA: Diagnosis not present

## 2014-09-01 DIAGNOSIS — Z87891 Personal history of nicotine dependence: Secondary | ICD-10-CM | POA: Insufficient documentation

## 2014-09-01 DIAGNOSIS — M791 Myalgia, unspecified site: Secondary | ICD-10-CM

## 2014-09-01 DIAGNOSIS — J111 Influenza due to unidentified influenza virus with other respiratory manifestations: Secondary | ICD-10-CM | POA: Diagnosis not present

## 2014-09-01 DIAGNOSIS — Z853 Personal history of malignant neoplasm of breast: Secondary | ICD-10-CM | POA: Diagnosis not present

## 2014-09-01 DIAGNOSIS — R5381 Other malaise: Secondary | ICD-10-CM | POA: Diagnosis not present

## 2014-09-01 DIAGNOSIS — IMO0001 Reserved for inherently not codable concepts without codable children: Secondary | ICD-10-CM | POA: Insufficient documentation

## 2014-09-01 DIAGNOSIS — R69 Illness, unspecified: Secondary | ICD-10-CM

## 2014-09-01 LAB — CBC
HEMATOCRIT: 32.7 % — AB (ref 36.0–46.0)
HEMOGLOBIN: 10.9 g/dL — AB (ref 12.0–15.0)
MCH: 29.6 pg (ref 26.0–34.0)
MCHC: 33.3 g/dL (ref 30.0–36.0)
MCV: 88.9 fL (ref 78.0–100.0)
Platelets: 339 10*3/uL (ref 150–400)
RBC: 3.68 MIL/uL — ABNORMAL LOW (ref 3.87–5.11)
RDW: 13.9 % (ref 11.5–15.5)
WBC: 10.4 10*3/uL (ref 4.0–10.5)

## 2014-09-01 LAB — BASIC METABOLIC PANEL
Anion gap: 15 (ref 5–15)
BUN: 10 mg/dL (ref 6–23)
CO2: 24 mEq/L (ref 19–32)
CREATININE: 0.82 mg/dL (ref 0.50–1.10)
Calcium: 9.3 mg/dL (ref 8.4–10.5)
Chloride: 102 mEq/L (ref 96–112)
GFR calc Af Amer: >90 mL/min (ref >90)
GFR calc non Af Amer: 88 mL/min — ABNORMAL LOW (ref >90)
GLUCOSE: 112 mg/dL — AB (ref 70–99)
Potassium: 3.5 mEq/L — ABNORMAL LOW (ref 3.7–5.3)
Sodium: 141 mEq/L (ref 137–147)

## 2014-09-01 MED ORDER — GUAIFENESIN ER 600 MG PO TB12: 1200.0000 mg | ORAL_TABLET | Freq: Two times a day (BID) | ORAL | Status: DC

## 2014-09-01 MED ORDER — SODIUM CHLORIDE 0.9 % IV BOLUS (SEPSIS)
500.0000 mL | Freq: Once | INTRAVENOUS | Status: AC
  Administered 2014-09-01: 500 mL via INTRAVENOUS

## 2014-09-01 MED ORDER — OXYMETAZOLINE HCL 0.05 % NA SOLN
1.0000 | Freq: Once | NASAL | Status: AC
  Administered 2014-09-01: 1 via NASAL
  Filled 2014-09-01: qty 15

## 2014-09-01 MED ORDER — IPRATROPIUM BROMIDE 0.03 % NA SOLN
2.0000 | Freq: Two times a day (BID) | NASAL | Status: DC
Start: 2014-09-01 — End: 2015-07-20

## 2014-09-01 MED ORDER — HYDROCODONE-HOMATROPINE 5-1.5 MG/5ML PO SYRP: 5.0000 mL | ORAL_SOLUTION | Freq: Four times a day (QID) | ORAL | Status: DC | PRN

## 2014-09-01 MED ORDER — SODIUM CHLORIDE 0.9 % IV BOLUS (SEPSIS)
1000.0000 mL | INTRAVENOUS | Status: AC
  Administered 2014-09-01: 1000 mL via INTRAVENOUS

## 2014-09-01 MED ORDER — ACETAMINOPHEN 325 MG PO TABS
650.0000 mg | ORAL_TABLET | Freq: Once | ORAL | Status: AC
  Administered 2014-09-01: 650 mg via ORAL
  Filled 2014-09-01: qty 2

## 2014-09-01 MED ORDER — OXYCODONE HCL 5 MG PO TABS
10.0000 mg | ORAL_TABLET | Freq: Once | ORAL | Status: AC
  Administered 2014-09-01: 10 mg via ORAL
  Filled 2014-09-01: qty 2

## 2014-09-01 MED ORDER — ONDANSETRON HCL 4 MG/2ML IJ SOLN
4.0000 mg | Freq: Once | INTRAMUSCULAR | Status: AC
  Administered 2014-09-01: 4 mg via INTRAVENOUS
  Filled 2014-09-01: qty 2

## 2014-09-01 NOTE — Discharge Instructions (Signed)
1. Medications: atrovent NS, mucinex, hycodan, usual home medications 2. Treatment: rest, drink plenty of fluids, take tylenol or ibuprofen for fever control 3. Follow Up: Please followup with your primary doctor for discussion of your diagnoses and further evaluation after today's visit; if you do not have a primary care doctor use the resource guide provided to find one;    Influenza Influenza ("the flu") is a viral infection of the respiratory tract. It occurs more often in winter months because people spend more time in close contact with one another. Influenza can make you feel very sick. Influenza easily spreads from person to person (contagious). CAUSES  Influenza is caused by a virus that infects the respiratory tract. You can catch the virus by breathing in droplets from an infected person's cough or sneeze. You can also catch the virus by touching something that was recently contaminated with the virus and then touching your mouth, nose, or eyes. RISKS AND COMPLICATIONS You may be at risk for a more severe case of influenza if you smoke cigarettes, have diabetes, have chronic heart disease (such as heart failure) or lung disease (such as asthma), or if you have a weakened immune system. Elderly people and pregnant women are also at risk for more serious infections. The most common problem of influenza is a lung infection (pneumonia). Sometimes, this problem can require emergency medical care and may be life threatening. SIGNS AND SYMPTOMS  Symptoms typically last 4 to 10 days and may include:  Fever.  Chills.  Headache, body aches, and muscle aches.  Sore throat.  Chest discomfort and cough.  Poor appetite.  Weakness or feeling tired.  Dizziness.  Nausea or vomiting. DIAGNOSIS  Diagnosis of influenza is often made based on your history and a physical exam. A nose or throat swab test can be done to confirm the diagnosis. TREATMENT  In mild cases, influenza goes away on its  own. Treatment is directed at relieving symptoms. For more severe cases, your health care provider may prescribe antiviral medicines to shorten the sickness. Antibiotic medicines are not effective because the infection is caused by a virus, not by bacteria. HOME CARE INSTRUCTIONS  Take medicines only as directed by your health care provider.  Use a cool mist humidifier to make breathing easier.  Get plenty of rest until your temperature returns to normal. This usually takes 3 to 4 days.  Drink enough fluid to keep your urine clear or pale yellow.  Cover yourmouth and nosewhen coughing or sneezing,and wash your handswellto prevent thevirusfrom spreading.  Stay homefromwork orschool untilthe fever is gonefor at least 28full day. PREVENTION  An annual influenza vaccination (flu shot) is the best way to avoid getting influenza. An annual flu shot is now routinely recommended for all adults in the [place] IF:  You experiencechest pain, yourcough worsens,or you producemore mucus.  Youhave nausea,vomiting, ordiarrhea.  Your fever returns or gets worse. SEEK IMMEDIATE MEDICAL CARE IF:  You havetrouble breathing, you become short of breath,or your skin ornails becomebluish.  You have severe painor stiffnessin the neck.  You develop a sudden headache, or pain in the face or ear.  You have nausea or vomiting that you cannot control. MAKE SURE YOU:   Understand these instructions.  Will watch your condition.  Will get help right away if you are not doing well or get worse. Document Released: 11/16/2000 Document Revised: 04/05/2014 Document Reviewed: 02/18/2012 [facility] Patient Information 2015 [place], [place]. This information is not intended to replace  advice given to you by your health care provider. Make sure you discuss any questions you have with your health care provider.

## 2014-09-01 NOTE — ED Provider Notes (Signed)
CSN: [id number]     Arrival date & time 09/01/14  1916 History   First MD Initiated Contact with Patient 09/01/14 1938     Chief Complaint  Patient presents with  . Fever  . Generalized Body Aches  . Nausea  . Cancer Pt      (Consider location/radiation/quality/duration/timing/severity/associated sxs/prior Treatment) Patient is a 43 y.o. female presenting with fever. The history is provided by the patient and medical records. No language interpreter was used.  Fever Associated symptoms: congestion, cough, headaches and rhinorrhea   Associated symptoms: no chest pain, no chills, no diarrhea, no dysuria, no ear pain, no myalgias, no nausea, no rash, no sore throat and no vomiting     Adriona Kaney is a 43 y.o. female  with a hx of HTN, endometriosis, anemia, Breast Caner, DVT presents to the Emergency Department complaining of gradual, persistent, progressively worsening generalized myalgias and body aches onset this AM.  Pt reports she has teenage children who have been sick with URIs. Associated symptoms include nausea, cough, rhinorrhea, generalized headache, subjective fevers.  Nothing makes it better and nothing makes it worse.  Pt denies chills, headache, neck pain, chest pain, SOB, abd pain, V/D, weakness, dizziness, syncope, dysuria.   Pt is undergoing reconstruction of her breast but is not currently undergoing chemo or radiation.  Pt reports taking her oxycodone today without relief of the pain.     Past Medical History  Diagnosis Date  . Endometriosis   . Hypertension   . Rash     fine rash on abd  . Anemia   . Wears glasses   . 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 Iron deficiency anemia, unspecified 02/25/2014  . BRCA1 positive     c.190T>G (p.Cys64Gly) @ [facility]   . Shortness of breath      when Hemoglobin low  . History of blood transfusion   . DVT (deep venous thrombosis)     Right Subclavian and IJ.   Past Surgical History  Procedure Laterality Date  .  Cervical polypectomy  2010  . Cesarean section      one previous  . Tubal ligation    . Portacath placement Right 02/23/2014    Procedure: INSERTION PORT-A-CATH;  Surgeon: Joyice Faster. Cornett, MD;  Location: [place];  Service: General;  Laterality: Right;  . Port-a-cath removal N/A 05/21/2014    Procedure: REMOVAL PORT-A-CATH;  Surgeon: Zenovia Jarred, MD;  Location: [place];  Service: General;  Laterality: N/A;  . Tee without cardioversion N/A 05/26/2014    Procedure: TRANSESOPHAGEAL ECHOCARDIOGRAM (TEE);  Surgeon: Larey Dresser, MD;  Location: [facility] ENDOSCOPY;  Service: Cardiovascular;  Laterality: N/A;  . Bilateral total mastectomy with axillary lymph node dissection Bilateral 07/28/2014    Procedure: BILATERAL TOTAL MASTECTOMY WITH LEFT AXILLARY SENTINEL LYMPH NODE BIOPSY;  Surgeon: Stark Klein, MD;  Location: [place];  Service: General;  Laterality: Bilateral;  . Breast reconstruction with placement of tissue expander and flex hd (acellular hydrated dermis) Bilateral 07/28/2014    Procedure: BILATERAL BREAST RECONSTRUCTION WITH PLACEMENT OF TISSUE EXPANDER AND FLEX HD (ACELLULAR HYDRATED DERMIS);  Surgeon: Theodoro Kos, DO;  Location: [place];  Service: Plastics;  Laterality: Bilateral;   Family History  Problem Relation Age of Onset  . Breast cancer Mother 20    currently 67  . Diabetes Father   . Pancreatic cancer Father 72  . Breast cancer Paternal Aunt 57    currently 61; BRCA1 positive  .  Stroke Maternal Grandfather   . Cancer Paternal Aunt     unk. primary; deceased 7s  . Breast cancer Cousin     daughter of unaffected paternal aunt; dx in her 64s   History  Substance Use Topics  . Smoking status: Former Smoker -- 0.25 packs/day for 15 years    Quit date: 02/18/2009  . Smokeless tobacco: Former Systems developer  . Alcohol Use: Yes     Comment: occasional   OB History   Grav Para Term Preterm Abortions TAB SAB Ect Mult Living   _0 Review of Systems   Constitutional: Positive for fever (subjective) and fatigue. Negative for chills and appetite change.  HENT: Positive for congestion, postnasal drip, rhinorrhea and sinus pressure. Negative for ear discharge, ear pain, mouth sores and sore throat.   Eyes: Negative for visual disturbance.  Respiratory: Positive for cough. Negative for chest tightness, shortness of breath, wheezing and stridor.   Cardiovascular: Negative for chest pain, palpitations and leg swelling.  Gastrointestinal: Negative for nausea, vomiting, abdominal pain and diarrhea.  Genitourinary: Negative for dysuria, urgency, frequency and hematuria.  Musculoskeletal: Negative for arthralgias, back pain, myalgias and neck stiffness.  Skin: Negative for rash.  Neurological: Positive for headaches. Negative for syncope, light-headedness and numbness.  Hematological: Negative for adenopathy.  Psychiatric/Behavioral: The patient is not nervous/anxious.   All other systems reviewed and are negative.     Allergies  Compazine  Home Medications   Prior to Admission medications   Medication Sig Start Date End Date Taking? Authorizing Provider  acetaminophen (TYLENOL) 325 MG tablet Take 2 tablets (650 mg total) by mouth every 6 (six) hours as needed for mild pain (or Temp > 100). 07/30/14  Yes Shawn Rayburn, PA-C  carvedilol (COREG) 6.25 MG tablet Take 1 tablet (6.25 mg total) by mouth 2 (two) times daily with a meal. 07/22/14  Yes Jolaine Artist, MD  cloNIDine (CATAPRES) 0.2 MG tablet Take 0.1 mg by mouth 2 (two) times daily.   Yes Historical Provider, MD  cyclobenzaprine (FLEXERIL) 10 MG tablet Take 1 tablet (10 mg total) by mouth 3 (three) times daily as needed for muscle spasms. 07/31/14  Yes Shawn Rayburn, PA-C  diazepam (VALIUM) 2 MG tablet Take 2 mg by mouth every 6 (six) hours as needed for anxiety or muscle spasms.   Yes Historical Provider, MD  diphenhydrAMINE (BENADRYL) 25 mg capsule Take 25 mg by mouth every 6 (six)  hours as needed for itching.   Yes Historical Provider, MD  docusate sodium 100 MG CAPS Take 100 mg by mouth 2 (two) times daily. 07/30/14  Yes Shawn Rayburn, PA-C  enoxaparin (LOVENOX) 100 MG/ML injection Inject 0.95 mLs (95 mg total) into the skin every 12 (twelve) hours. 08/09/14  Yes Verlee Monte, MD  ferrous sulfate 325 (65 FE) MG tablet Take 325 mg by mouth 3 (three) times daily with meals.   Yes Historical Provider, MD  hydrALAZINE (APRESOLINE) 50 MG tablet Take 1 tablet (50 mg total) by mouth every 8 (eight) hours. 07/22/14  Yes Jolaine Artist, MD  ibuprofen (ADVIL,MOTRIN) 600 MG tablet Take 600 mg by mouth every 6 (six) hours as needed for mild pain.   Yes Historical Provider, MD  lidocaine-prilocaine (EMLA) cream Apply 1 application topically as needed (For port-a-cath.). 07/13/14  Yes [name], MD  lisinopril (PRINIVIL,ZESTRIL) 40 MG tablet Take 40 mg by mouth daily.   Yes Historical Provider, MD  loratadine (CLARITIN) 10 MG tablet Take 10 mg by mouth daily as needed (For inflammation after receiving her injections during chemo.).    Yes Historical Provider, MD  LORazepam (ATIVAN) 0.5 MG tablet Take 1 tablet (0.5 mg total) by mouth every 6 (six) hours as needed (Nausea or vomiting). 07/05/14  Yes Lennis P Livesay, MD  ondansetron (ZOFRAN) 8 MG tablet Take 8 mg by mouth 2 (two) times daily as needed.    Yes Historical Provider, MD  oxyCODONE (OXY IR/ROXICODONE) 5 MG immediate release tablet Take 15-20 mg by mouth every 4 (four) hours as needed for severe pain.   Yes Historical Provider, MD  traMADol (ULTRAM) 50 MG tablet Take 50 mg by mouth every 12 (twelve) hours as needed for moderate pain.   Yes Historical Provider, MD  guaiFENesin (MUCINEX) 600 MG 12 hr tablet Take 2 tablets (1,200 mg total) by mouth 2 (two) times daily. 09/01/14   Dmarius Reeder, PA-C  HYDROcodone-homatropine (HYCODAN) 5-1.5 MG/5ML syrup Take 5 mLs by mouth every 6 (six) hours as needed for cough. 09/01/14    Ameen Mostafa, PA-C  ipratropium (ATROVENT) 0.03 % nasal spray Place 2 sprays into the nose 2 (two) times daily. PRN congestion 09/01/14   Demetruis Depaul, PA-C   BP 171/83  Pulse 87  Temp(Src) 98.7 F (37.1 C) (Oral)  Resp 18  SpO2 96% Physical Exam  Nursing note and vitals reviewed. Constitutional: She is oriented to person, place, and time. She appears well-developed and well-nourished. No distress.  HENT:  Head: Normocephalic and atraumatic.  Right Ear: Tympanic membrane, external ear and ear canal normal.  Left Ear: Tympanic membrane, external ear and ear canal normal.  Nose: Mucosal edema and rhinorrhea present. No epistaxis. Right sinus exhibits no maxillary sinus tenderness and no frontal sinus tenderness. Left sinus exhibits no maxillary sinus tenderness and no frontal sinus tenderness.  Mouth/Throat: Uvula is midline, oropharynx is clear and moist and mucous membranes are normal. Mucous membranes are not pale and not cyanotic. No oropharyngeal exudate, posterior oropharyngeal edema, posterior oropharyngeal erythema or tonsillar abscesses.  Eyes: Conjunctivae and EOM are normal. Pupils are equal, round, and reactive to light. No scleral icterus.  No horizontal, vertical or rotational nystagmus  Neck: Normal range of motion and full passive range of motion without pain. Neck supple.  Full active and passive ROM without pain No midline or paraspinal tenderness No nuchal rigidity or meningeal signs  Cardiovascular: Normal rate, regular rhythm, normal heart sounds and intact distal pulses.   No murmur heard. Pulmonary/Chest: Effort normal and breath sounds normal. No stridor. No respiratory distress. She has no wheezes. She has no rales.  Clear and equal breath sounds without focal wheezes, rhonchi, rales Bilateral surgical incisions to the lateral breasts for reconstruction; incisions are intact without erythema, induration, purulent drainage or evidence of abscess.     Abdominal: Soft. Bowel sounds are normal. There is no tenderness. There is no rebound and no guarding.  Musculoskeletal: Normal range of motion.  PICC line in place in the LUE without erythema or induration.  Lymphadenopathy:    She has no cervical adenopathy.  Neurological: She is alert and oriented to person, place, and time. She has normal reflexes. No cranial nerve deficit. She exhibits normal muscle tone. Coordination normal.  Mental Status:  Alert, oriented, thought content appropriate. Speech fluent without evidence of aphasia. Able to follow 2 step commands without difficulty.  Cranial Nerves:  II:  Peripheral visual fields grossly normal, pupils equal, round, reactive to  light III,IV, VI: ptosis not present, extra-ocular motions intact bilaterally  V,VII: smile symmetric, facial light touch sensation equal VIII: hearing grossly normal bilaterally  IX,X: gag reflex present  XI: bilateral shoulder shrug equal and strong XII: midline tongue extension  Motor:  5/5 in upper and lower extremities bilaterally including strong and equal grip strength and dorsiflexion/plantar flexion Sensory: Pinprick and light touch normal in all extremities.  Deep Tendon Reflexes: 2+ and symmetric  Cerebellar: normal finger-to-nose with bilateral upper extremities Gait: normal gait and balance CV: distal pulses palpable throughout   Skin: Skin is warm and dry. No rash noted. She is not diaphoretic.  Psychiatric: She has a normal mood and affect. Her behavior is normal. Judgment and thought content normal.    ED Course  Procedures (including critical care time) Labs Review Labs Reviewed  CBC - Abnormal; Notable for the following:    RBC 3.68 (*)    Hemoglobin 10.9 (*)    HCT 32.7 (*)    All other components within normal limits  BASIC METABOLIC PANEL - Abnormal; Notable for the following:    Potassium 3.5 (*)    Glucose, Bld 112 (*)    GFR calc non Af Amer 88 (*)    All other components  within normal limits    Imaging Review Dg Chest 2 View  09/01/2014   CLINICAL DATA:  Cough, fever and body aches.  EXAM: CHEST  2 VIEW  COMPARISON:  08/06/2014  FINDINGS: The PICC line is stable. The cardiac silhouette, mediastinal and hilar contours are unchanged. The lungs are clear of acute process. The bony thorax is intact.  IMPRESSION: No acute cardiopulmonary findings.   Electronically Signed   By: Kalman Jewels M.D.   On: 09/01/2014 21:14     EKG Interpretation None      MDM   Final diagnoses:  Influenza-like illness  Myalgia   Nacole Fluhr presents with symptoms of viral URI.  Pt reports feeling poorly with sick contacts.  Will give fluids, check labs and reassess.    11:21 PM Patient given pain control, fluids with moderate improvement.  Labs reassuring.  Pt CXR negative for acute infiltrate. Patients symptoms are consistent with URI, likely viral etiology. Discussed that antibiotics are not indicated for viral infections. Pt will be discharged with symptomatic treatment.  Verbalizes understanding and is agreeable with plan. Pt is hemodynamically stable & in NAD prior to dc.   BP 171/83  Pulse 87  Temp(Src) 98.7 F (37.1 C) (Oral)  Resp 18  SpO2 96%     Abigail Butts, PA-C 09/01/14 2348

## 2014-09-01 NOTE — ED Notes (Addendum)
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

## 2014-09-02 ENCOUNTER — Encounter: Payer: Self-pay | Admitting: Nurse Practitioner

## 2014-09-02 ENCOUNTER — Ambulatory Visit (HOSPITAL_BASED_OUTPATIENT_CLINIC_OR_DEPARTMENT_OTHER): Payer: Medicaid Other

## 2014-09-02 ENCOUNTER — Ambulatory Visit (HOSPITAL_BASED_OUTPATIENT_CLINIC_OR_DEPARTMENT_OTHER): Payer: Medicaid Other | Admitting: Nurse Practitioner

## 2014-09-02 ENCOUNTER — Other Ambulatory Visit (HOSPITAL_BASED_OUTPATIENT_CLINIC_OR_DEPARTMENT_OTHER): Payer: Medicaid Other

## 2014-09-02 ENCOUNTER — Other Ambulatory Visit: Payer: Self-pay | Admitting: *Deleted

## 2014-09-02 VITALS — BP 150/81 | HR 104 | Temp 100.0°F | Resp 18 | Ht 65.0 in | Wt 201.8 lb

## 2014-09-02 DIAGNOSIS — R509 Fever, unspecified: Secondary | ICD-10-CM

## 2014-09-02 DIAGNOSIS — G893 Neoplasm related pain (acute) (chronic): Secondary | ICD-10-CM

## 2014-09-02 DIAGNOSIS — C50912 Malignant neoplasm of unspecified site of left female breast: Secondary | ICD-10-CM

## 2014-09-02 DIAGNOSIS — C50412 Malignant neoplasm of upper-outer quadrant of left female breast: Secondary | ICD-10-CM

## 2014-09-02 DIAGNOSIS — Z452 Encounter for adjustment and management of vascular access device: Secondary | ICD-10-CM

## 2014-09-02 DIAGNOSIS — R5081 Fever presenting with conditions classified elsewhere: Secondary | ICD-10-CM

## 2014-09-02 LAB — URINALYSIS, MICROSCOPIC - CHCC
BLOOD: NEGATIVE
Bilirubin (Urine): NEGATIVE
Glucose: NEGATIVE mg/dL
Ketones: NEGATIVE mg/dL
Nitrite: NEGATIVE
PROTEIN: NEGATIVE mg/dL
SPECIFIC GRAVITY, URINE: 1.02 (ref 1.003–1.035)
Urobilinogen, UR: 0.2 mg/dL (ref 0.2–1)
pH: 6 (ref 4.6–8.0)

## 2014-09-02 MED ORDER — HEPARIN SOD (PORK) LOCK FLUSH 100 UNIT/ML IV SOLN
500.0000 [IU] | Freq: Once | INTRAVENOUS | Status: AC
  Administered 2014-09-02: 500 [IU] via INTRAVENOUS
  Filled 2014-09-02: qty 5

## 2014-09-02 MED ORDER — OXYCODONE-ACETAMINOPHEN 5-325 MG PO TABS
ORAL_TABLET | ORAL | Status: AC
  Filled 2014-09-02: qty 2

## 2014-09-02 MED ORDER — SODIUM CHLORIDE 0.9 % IJ SOLN
10.0000 mL | INTRAMUSCULAR | Status: DC | PRN
  Administered 2014-09-02: 10 mL via INTRAVENOUS
  Filled 2014-09-02: qty 10

## 2014-09-02 MED ORDER — DOXYCYCLINE HYCLATE 100 MG PO TABS: 100.0000 mg | ORAL_TABLET | Freq: Two times a day (BID) | ORAL | Status: DC

## 2014-09-02 MED ORDER — OXYCODONE-ACETAMINOPHEN 5-325 MG PO TABS
2.0000 | ORAL_TABLET | Freq: Once | ORAL | Status: AC
  Administered 2014-09-02: 2 via ORAL

## 2014-09-02 NOTE — Progress Notes (Signed)
This RN retrieved message from pt at [facility] stating concern due to onset of fever and malaise " that feels like when I had staph in my blood ". Pt stated she went to ER pm yesterday and was given medications related to viral conditions- and now this am her temp is higher at 101.1.  This RN returned call to pt and discussed ER visit- noted workup- per discussion with [facility] appointment made for pt to come in for additional work up and visit.  Pt aware of above.

## 2014-09-02 NOTE — Progress Notes (Signed)
[name]   Chief Complaint  Patient presents with  . Fever    HPI: Tamara [name] 43 y.o. female diagnosed with breast cancer.  Patient is status post bilateral mastectomy and breast reconstruction with tissue expanders.  Herceptin is currently on hold for decreased ejection fraction.  Patient called the cancer Center today requesting urgent care visit.  She states she visited with her plastic surgeon Dr. Migdalia Dk on Tuesday, 08/31/2014.  The last of her bilateral breasts drainage tubes were removed at that time.  Patient reports that she has developed a right breast hematoma post surgery; and that the right breast has remained more tender and edematous than the left since that time.  Patient developed a fever up to Max of 101.1 yesterday morning 09/01/2014.  She states that she has developed a central line infection in the past; and is now worried that her PICC line may also be infected.  She has also had some mild nasal congestion and a dry cough.  She states that her teenage children have had some URI symptoms as well. She presented to the emergency department last night for further evaluation; and was advised that her fever was mostly viral in nature.  Since her visit to the emergency department-the patient states that she has developed some edema and tenderness distal to her left upper arm PICC site.  She continues with intermittent fevers to a maximum of greater than 101.  She has been trying to take Tylenol every 4-6 hours.  Also, patient has had significant pain since her breast surgery.  She states she takes both OxyContin and oxycodone as directed per Dr. Migdalia Dk.  She states that her plastic surgeon's office was calling her in some additional pain medication to better manage her pain.  Patient is scheduled for repeat echo on 09/06/2014 to reevaluate her ejection fraction.  Fever     CURRENT THERAPY: Upcoming Treatment Dates - BREAST TCH q21d (order Herceptin  separately) Days with orders from any treatment category:  No upcoming days in selected categories.    Review of Systems  Constitutional: Positive for fever.    Past Medical History  Diagnosis Date  . Endometriosis   . Hypertension   . Rash     fine rash on abd  . Anemia   . Wears glasses   . 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 Iron deficiency anemia, unspecified 02/25/2014  . BRCA1 positive     c.190T>G (p.Cys64Gly) @ [facility]   . Shortness of breath      when Hemoglobin low  . History of blood transfusion   . DVT (deep venous thrombosis)     Right Subclavian and IJ.    Past Surgical History  Procedure Laterality Date  . Cervical polypectomy  2010  . Cesarean section      one previous  . Tubal ligation    . Portacath placement Right 02/23/2014    Procedure: INSERTION PORT-A-CATH;  Surgeon: Joyice Faster. Cornett, MD;  Location: [facility];  Service: General;  Laterality: Right;  . Port-a-cath removal N/A 05/21/2014    Procedure: REMOVAL PORT-A-CATH;  Surgeon: Zenovia Jarred, MD;  Location: [facility];  Service: General;  Laterality: N/A;  . Tee without cardioversion N/A 05/26/2014    Procedure: TRANSESOPHAGEAL ECHOCARDIOGRAM (TEE);  Surgeon: Larey Dresser, MD;  Location: [facility]COPY;  Service: Cardiovascular;  Laterality: N/A;  . Bilateral total mastectomy with axillary lymph node dissection Bilateral 07/28/2014    Procedure:  BILATERAL TOTAL MASTECTOMY WITH LEFT AXILLARY SENTINEL LYMPH NODE BIOPSY;  Surgeon: Stark Klein, MD;  Location: [facility];  Service: General;  Laterality: Bilateral;  . Breast reconstruction with placement of tissue expander and flex hd (acellular hydrated dermis) Bilateral 07/28/2014    Procedure: BILATERAL BREAST RECONSTRUCTION WITH PLACEMENT OF TISSUE EXPANDER AND FLEX HD (ACELLULAR HYDRATED DERMIS);  Surgeon: Theodoro Kos, DO;  Location: [facility];  Service: Plastics;  Laterality: Bilateral;    has Breast cancer of upper-outer quadrant of  left female breast; HTN (hypertension); Iron deficiency anemia, unspecified; Local infection due to port-a-cath; BRCA1 positive; Palpitations; Staphylococcus aureus bacteremia with sepsis; Sinus tachycardia; AKI (acute kidney injury); Acute blood loss anemia; DVT (deep venous thrombosis) right IJ/subclavian; Bloodstream infection due to port-a-cath; Breast cancer; Anemia; Symptomatic anemia; Absence of breast; Fever; and Neoplasm related pain on her problem list.     is allergic to compazine.    Medication List       This list is accurate as of: 09/02/14  2:17 PM.  Always use your most recent med list.               acetaminophen 325 MG tablet  Commonly known as:  TYLENOL  Take 2 tablets (650 mg total) by mouth every 6 (six) hours as needed for mild pain (or Temp > 100).     carvedilol 6.25 MG tablet  Commonly known as:  COREG  Take 1 tablet (6.25 mg total) by mouth 2 (two) times daily with a meal.     cloNIDine 0.2 MG tablet  Commonly known as:  CATAPRES  Take 0.1 mg by mouth 2 (two) times daily.     cyclobenzaprine 10 MG tablet  Commonly known as:  FLEXERIL  Take 1 tablet (10 mg total) by mouth 3 (three) times daily as needed for muscle spasms.     diazepam 2 MG tablet  Commonly known as:  VALIUM  Take 2 mg by mouth every 6 (six) hours as needed for anxiety or muscle spasms.     diphenhydrAMINE 25 mg capsule  Commonly known as:  BENADRYL  Take 25 mg by mouth every 6 (six) hours as needed for itching.     DSS 100 MG Caps  Take 100 mg by mouth 2 (two) times daily.     enoxaparin 100 MG/ML injection  Commonly known as:  LOVENOX  Inject 0.95 mLs (95 mg total) into the skin every 12 (twelve) hours.     ferrous sulfate 325 (65 FE) MG tablet  Take 325 mg by mouth 3 (three) times daily with meals.     guaiFENesin 600 MG 12 hr tablet  Commonly known as:  MUCINEX  Take 2 tablets (1,200 mg total) by mouth 2 (two) times daily.     hydrALAZINE 50 MG tablet  Commonly known as:   APRESOLINE  Take 1 tablet (50 mg total) by mouth every 8 (eight) hours.     HYDROcodone-homatropine 5-1.5 MG/5ML syrup  Commonly known as:  HYCODAN  Take 5 mLs by mouth every 6 (six) hours as needed for cough.     ibuprofen 600 MG tablet  Commonly known as:  ADVIL,MOTRIN  Take 600 mg by mouth every 6 (six) hours as needed for mild pain.     ipratropium 0.03 % nasal spray  Commonly known as:  ATROVENT  Place 2 sprays into the nose 2 (two) times daily. PRN congestion     lidocaine-prilocaine cream  Commonly known as:  EMLA  Apply 1  application topically as needed (For port-a-cath.).     lisinopril 40 MG tablet  Commonly known as:  PRINIVIL,ZESTRIL  Take 40 mg by mouth daily.     loratadine 10 MG tablet  Commonly known as:  CLARITIN  Take 10 mg by mouth daily as needed (For inflammation after receiving her injections during chemo.).     LORazepam 0.5 MG tablet  Commonly known as:  ATIVAN  Take 1 tablet (0.5 mg total) by mouth every 6 (six) hours as needed (Nausea or vomiting).     ondansetron 8 MG tablet  Commonly known as:  ZOFRAN  Take 8 mg by mouth 2 (two) times daily as needed.     oxyCODONE 5 MG immediate release tablet  Commonly known as:  Oxy IR/ROXICODONE  Take 15-20 mg by mouth every 4 (four) hours as needed for severe pain.     traMADol 50 MG tablet  Commonly known as:  ULTRAM  Take 50 mg by mouth every 12 (twelve) hours as needed for moderate pain.         PHYSICAL EXAMINATION  Blood pressure 167/105, pulse 122, temperature 100.5 F (38.1 C), temperature source Oral, resp. rate 19, height 5' 5"  (1.651 m), weight 201 lb 12.8 oz (91.536 kg).  Physical Exam  Nursing note and vitals reviewed. Constitutional: She is oriented to person, place, and time and well-developed, well-nourished, and in no distress. No distress.  HENT:  Head: Normocephalic and atraumatic.  Mouth/Throat: Oropharynx is clear and moist. No oropharyngeal exudate.  Eyes: Conjunctivae and  EOM are normal. Pupils are equal, round, and reactive to light. No scleral icterus.  Neck: Normal range of motion. Neck supple. No JVD present. No tracheal deviation present. No thyromegaly present.  Cardiovascular: Normal rate, regular rhythm, normal heart sounds and intact distal pulses.  Exam reveals no friction rub.   No murmur heard. Pulmonary/Chest: Effort normal and breath sounds normal. No respiratory distress. She has no wheezes.  Abdominal: Soft. Bowel sounds are normal. She exhibits no distension. There is no tenderness. There is no rebound.  Musculoskeletal: Normal range of motion. She exhibits edema and tenderness.  Left upper extremity PICC line intact.  Area just distal to PICC line noted to have some mild edema and tenderness.  No erythema, no warmth, and no red streaks noted.  All pulses are palpable in all extremities are warm.  Patient has full range motion with all extremities.  Lymphadenopathy:    She has no cervical adenopathy.  Neurological: She is alert and oriented to person, place, and time. Gait normal.  Skin: Skin is warm and dry. No rash noted. No erythema.  Patient has bilateral breast drainage tube insertion sites with some trace yellow/brown drainage crusted.  No erythema, warmth, or red streaks at insertion sites.  Psychiatric:  Patient appears slightly anxious today.    LABORATORY DATA:. CBC  Lab Results  Component Value Date   WBC 10.4 09/01/2014   RBC 3.68* 09/01/2014   HGB 10.9* 09/01/2014   HCT 32.7* 09/01/2014   PLT 339 09/01/2014   MCV 88.9 09/01/2014   MCH 29.6 09/01/2014   MCHC 33.3 09/01/2014   RDW 13.9 09/01/2014   LYMPHSABS 1.6 08/06/2014   MONOABS 0.9 08/06/2014   EOSABS 0.4 08/06/2014   BASOSABS 0.0 08/06/2014     CMET  Lab Results  Component Value Date   NA 141 09/01/2014   K 3.5* 09/01/2014   CL 102 09/01/2014   CO2 24 09/01/2014   GLUCOSE 112* 09/01/2014  BUN 10 09/01/2014   CREATININE 0.82 09/01/2014   CALCIUM 9.3 09/01/2014   PROT 6.1  08/07/2014   ALBUMIN 2.7* 08/07/2014   AST 11 08/07/2014   ALT 9 08/07/2014   ALKPHOS 57 08/07/2014   BILITOT <0.2* 08/07/2014   GFRNONAA 88* 09/01/2014   GFRAA >90 09/01/2014    Urinalysis, Microscopic - [facility] (Order [id number])       Urinalysis, Microscopic - [facility]  Status: Final result     Visible to patient: This result is not viewable by the patient.     Next appt: 09/03/2014 at 10:00 AM in Oncology ([facility])     Dx: Fever presenting with conditions clas...                Ref Range 11:42 AM (09/02/14) 37moago (05/19/14) 357mogo (05/19/14) 33m83moo (04/13/14) 33mo31mo (04/13/14)     Glucose Negative mg/dL Negative                Bilirubin (Urine) Negative  Negative                Ketones Negative mg/dL Negative                Specific Gravity, Urine 1.003 - 1.035  1.020      1.022 R     1.016 R       Blood Negative  Negative                pH 4.6 - 8.0  6.0      5.5 R     8.5 (H) R       Protein Negative- <30 mg/dL Negative      100 (A) R     NEGATIVE R       Urobilinogen, UR 0.2 - 1 mg/dL 0.2                Nitrite Negative  Negative      NEGATIVE R     NEGATIVE R       Leukocyte Esterase Negative  Large                RBC / HPF 0 - 2  0-2    0-2 R     21-50 R         WBC, UA 0 - 2  21-50    TOO NUMEROUS TO COUNT R     21-50 R, CM         Bacteria, UA Negative- Trace  Moderate    MANY (A) R     FEW (A) R         Epithelial Cells Negative- Few  Few                Mucus, UA Negative- Small  Moderate               Resulting Agency       RADIOGRAPHIC STUDIES: DG Chest 2 View Status: Final result         PACS Images    Show images for DG Chest 2 View         Study Result    CLINICAL DATA: Cough, fever and body aches.  EXAM:  CHEST 2 VIEW  COMPARISON: 08/06/2014  FINDINGS:  The PICC line is stable. The cardiac silhouette, mediastinal and  hilar contours are unchanged. The lungs are clear of acute process.  The bony thorax is  intact.  IMPRESSION:  No acute cardiopulmonary findings.  Electronically Signed  By: Kalman Jewels M.D.  On: 09/01/2014 21:14      ASSESSMENT/PLAN:    Breast cancer of upper-outer quadrant of left female breast  Assessment & Plan Patient underwent bilateral mastectomy and reconstruction with breast expanders per Dr. Migdalia Dk on 07/28/2014.  Patient reports that she did develop some right breast hematoma; and just had her final breast drainage tubes removed per Dr. Migdalia Dk Tuesday, 08/31/2014.  Patient reports that her right breast continues larger than her left; and is also more tender.  No evidence of obvious hematoma to right breast; but right breast is more edematous/larger than left breast.  Bilateral breast continue a tender with any palpation whatsoever.  No specific erythema, edema, warmth, or red streaks noted.  Patient does have previous drainage tube insertion sites to the site of her bilateral breast; with some yellow/brown discharge on dressings to sites.  No specific erythema or tenderness to insertion sites.  Patient did present back to Dr. Migdalia Dk' office earlier  today for reevaluation of her bilateral breast.  Dr. Migdalia Dk advised patient to followup either here at the [facility] or the emergency department for blood cultures.  Also of note-patient's last Herceptin infusion was 06/09/2014.  Herceptin has been held since that time do to a decrease in patient's ejection fraction down to 45-50%.  Patient has a repeat echo scheduled for 09/06/2014.  She has plans to return for follow up visit here at the [facility] on 09/10/2014.  Decision will be made at that time regarding the resumption of Herceptin infusion; depending on echo results.     Fever  Assessment & Plan CBC obtained per the emergency department last night with normal white count.  Urinalysis obtained today did show bacteria; the patient denies any UTI symptoms whatsoever.  Awaiting urine culture results.  Chest x-ray  obtained per the emergency department last night was negative for any acute findings.  Patient's left upper extremity PICC line site with some mild edema and tenderness distal to the site.  There is no erythema, warmth, or red streaks to the site.  Patient has a history of previous central line infection.  Will discontinue patient's PICC line today; and culture the tip.  Have also drawn blood cultures-one from the PICC line and one peripherally.  Awaiting blood culture results.  Also, obtained wound culture from right breast drainage tube insertion site.  Will prescribe doxycycline twice a day for a total of 10 days.  Also advised patient to follow Dr. Migdalia Dk as needed for further evaluation of her bilateral breasts.   Neoplasm related pain  Assessment & Plan Patient has been experiencing some significant pain since her bilateral breast surgery.  She states that she is taking OxyContin on a regular basis; and taking between 3 and 4 of the oxycodone at a time of for breakthrough pain.  I she states that Dr. Leafy Ro office was calling her in some different pain medicine to better manage her pain.  Patient was given oxycodone x2 tablets while in the [facility].   Patient stated understanding of all instructions; and was in agreement with this plan of care. The patient knows to call the clinic with any problems, questions or concerns.   Review/collaboration with Dr. Jana Hakim regarding all aspects of patient's visit today.   Total time spent with patient was 40 minutes;  with greater than 75 percent of that time spent in face to face counseling regarding her symptoms, collection of wound cultures,  and coordination of care and follow up.  Disclaimer: This note was dictated with voice recognition software. Similar sounding words can inadvertently be transcribed and may not be corrected upon review.   Drue Second, NP 09/02/2014

## 2014-09-02 NOTE — Assessment & Plan Note (Signed)
Patient has been experiencing some significant pain since her bilateral breast surgery.  She states that she is taking OxyContin on a regular basis; and taking between 3 and 4 of the oxycodone at a time of for breakthrough pain.  I she states that Dr. Leafy Ro office was calling her in some different pain medicine to better manage her pain.  Patient was given oxycodone x2 tablets while in the [place].

## 2014-09-02 NOTE — Patient Instructions (Signed)
PICC Removal °A peripherally inserted central catheter (PICC) is a long, thin, flexible tube that a health care provider can insert into a vein in your upper arm. It is a type of IV. Having a PICC in place gives health care providers quick access to your veins. It is a good way to distribute medicines and fluids quickly throughout your body. °LET YOUR HEALTH CARE PROVIDER KNOW ABOUT: °· Any allergies you have. °· All medicines you are taking, including vitamins, herbs, eye drops, creams, and over-the-counter medicines. °· Previous problems you or members of your family have had with the use of anesthetics. °· Any blood disorders you have. °· Previous surgeries you have had. °· Medical conditions you have. °RISKS AND COMPLICATIONS °Generally, this is a safe procedure. However, as with any procedure, problems can occur. Possible problems include: °· Bleeding. °· Infection. °BEFORE THE PROCEDURE °You need an order from your health care provider to have your PICC removed. Only a health care provider trained in PICC removal should take it out. You may have your PICC removed in the hospital or in an outpatient setting. °PROCEDURE °Having a PICC removed is usually painless. Removal of the tape that holds the PICC in place may be the most uncomfortable part. Do not take out the PICC yourself. Only a trained clinical professional, such as a PICC nurse, should remove it. If your health care provider thinks your PICC is infected, the tip may be sent to the lab for testing. °After taking out your PICC, your health care provider may:  °· Hold gentle pressure on the exit site. °· Apply some antibiotic ointment. °· Place a small bandage over the insertion site. °AFTER THE PROCEDURE °You should be able to remove the bandage after 24 hours. Follow all your health care provider's instructions.  °· Keep the insertion site clean by washing it gently with soap and water. °· Do not pick or remove a scab. °· Avoid strenuous physical  activity for a day or two. °· Let your health care provider know if you develop redness, soreness, bleeding, swelling, or drainage from the insertion site. °· Let your health care provider know if you develop chills or fever. °Document Released: 05/09/2010 Document Revised: 11/24/2013 Document Reviewed: 09/11/2013 °Exit[employer]® Patient Information ©2015 [employer]. This information is not intended to replace advice given to you by your health care provider. Make sure you discuss any questions you have with your health care provider. ° °

## 2014-09-02 NOTE — Patient Instructions (Signed)
PICC Home Guide A peripherally inserted central catheter (PICC) is a long, thin, flexible tube that is inserted into a vein in the upper arm. It is a form of intravenous (IV) access. It is considered to be a "central" line because the tip of the PICC ends in a large vein in your chest. This large vein is called the superior vena cava (SVC). The PICC tip ends in the SVC because there is a lot of blood flow in the SVC. This allows medicines and IV fluids to be quickly distributed throughout the body. The PICC is inserted using a sterile technique by a specially trained nurse or physician. After the PICC is inserted, a chest X-ray exam is done to be sure it is in the correct place.  A PICC may be placed for different reasons, such as:  To give medicines and liquid nutrition that can only be given through a central line. Examples are:  Certain antibiotic treatments.  Chemotherapy.  Total parenteral nutrition (TPN).  To take frequent blood samples.  To give IV fluids and blood products.  If there is difficulty placing a peripheral intravenous (PIV) catheter. If taken care of properly, a PICC can remain in place for several months. A PICC can also allow a person to go home from the hospital early. Medicine and PICC care can be managed at home by a family member or home health care team. WHAT PROBLEMS CAN HAPPEN WHEN I HAVE A PICC? Problems with a PICC can occasionally occur. These may include the following:  A blood clot (thrombus) forming in or at the tip of the PICC. This can cause the PICC to become clogged. A clot-dissolving medicine called tissue plasminogen activator (tPA) can be given through the PICC to help break up the clot.  Inflammation of the vein (phlebitis) in which the PICC is placed. Signs of inflammation may include redness, pain at the insertion site, red streaks, or being able to feel a "cord" in the vein where the PICC is located.  Infection in the PICC or at the insertion  site. Signs of infection may include fever, chills, redness, swelling, or pus drainage from the PICC insertion site.  PICC movement (malposition). The PICC tip may move from its original position due to excessive physical activity, forceful coughing, sneezing, or vomiting.  A break or cut in the PICC. It is important to not use scissors near the PICC.  Nerve or tendon irritation or injury during PICC insertion. WHAT SHOULD I KEEP IN MIND ABOUT ACTIVITIES WHEN I HAVE A PICC?  You may bend your arm and move it freely. If your PICC is near or at the bend of your elbow, avoid activity with repeated motion at the elbow.  Rest at home for the remainder of the day following PICC line insertion.  Avoid lifting heavy objects as instructed by your health care provider.  Avoid using a crutch with the arm on the same side as your PICC. You may need to use a walker. WHAT SHOULD I KNOW ABOUT MY PICC DRESSING?  Keep your PICC bandage (dressing) clean and dry to prevent infection.  Ask your health care provider when you may shower. Ask your health care provider to teach you how to wrap the PICC when you do take a shower.  Change the PICC dressing as instructed by your health care provider.  Change your PICC dressing if it becomes loose or wet. WHAT SHOULD I KNOW ABOUT PICC CARE?  Check the PICC insertion site   daily for leakage, redness, swelling, or pain.  Do not take a bath, swim, or use hot tubs when you have a PICC. Cover PICC line with clear plastic wrap and tape to keep it dry while showering.  Flush the PICC as directed by your health care provider. Let your health care provider know right away if the PICC is difficult to flush or does not flush. Do not use force to flush the PICC.  Do not use a syringe that is less than 10 mL to flush the PICC.  Never pull or tug on the PICC.  Avoid blood pressure checks on the arm with the PICC.  Keep your PICC identification card with you at all  times.  Do not take the PICC out yourself. Only a trained clinical professional should remove the PICC. SEEK IMMEDIATE MEDICAL CARE IF:  Your PICC is accidentally pulled all the way out. If this happens, cover the insertion site with a bandage or gauze dressing. Do not throw the PICC away. Your health care provider will need to inspect it.  Your PICC was tugged or pulled and has partially come out. Do not  push the PICC back in.  There is any type of drainage, redness, or swelling where the PICC enters the skin.  You cannot flush the PICC, it is difficult to flush, or the PICC leaks around the insertion site when it is flushed.  You hear a "flushing" sound when the PICC is flushed.  You have pain, discomfort, or numbness in your arm, shoulder, or jaw on the same side as the PICC.  You feel your heart "racing" or skipping beats.  You notice a hole or tear in the PICC.  You develop chills or a fever. MAKE SURE YOU:   Understand these instructions.  Will watch your condition.  Will get help right away if you are not doing well or get worse. Document Released: 05/26/2003 Document Revised: 04/05/2014 Document Reviewed: 07/27/2013 [employer] Patient Information 2015 [employer]. This information is not intended to replace advice given to you by your health care provider. Make sure you discuss any questions you have with your health care provider.  

## 2014-09-02 NOTE — Progress Notes (Signed)
PICC line removed per order from Selena Lesser, NP. Patient placed flat and PICC line removed. Line removed without meeting any resistance - length of cathter after removal was 43. Patient tolerated removal without any problems or complications. Vaseline gauze pressure dressing applied to site and manual pressure held to arm for a minute. End of PICC line sent to lab for culture. Patient laid flat for 30 minutes after line removed. VS taken and charted at discharge. Patient given instructions on AVS for after PICC line removal.

## 2014-09-02 NOTE — Assessment & Plan Note (Addendum)
CBC obtained per the emergency department last night with normal white count.  Urinalysis obtained today did show bacteria; the patient denies any UTI symptoms whatsoever.  Awaiting urine culture results.  Chest x-ray obtained per the emergency department last night was negative for any acute findings.  Patient's left upper extremity PICC line site with some mild edema and tenderness distal to the site.  There is no erythema, warmth, or red streaks to the site.  Patient has a history of previous central line infection.  Will discontinue patient's PICC line today; and culture the tip.  Have also drawn blood cultures-one from the PICC line and one peripherally.  Awaiting blood culture results.  Also, obtained wound culture from right breast drainage tube insertion site.  Will prescribe doxycycline twice a day for a total of 10 days.  Also advised patient to follow Dr. Migdalia Dk as needed for further evaluation of her bilateral breasts.

## 2014-09-02 NOTE — Progress Notes (Signed)
Pt came in for labs from PICC, pt complaining of tenderness around PICC site with edema with no redness. Informed Marlon Pel, RN who came and assessed PICC site. Obtained blood cultures from PICC line. Blood return noted from both lumens, flushed with saline and heparin. No dressing change done at this time until assessed by MD. Pt sent to lab for peripheral blood cultures.

## 2014-09-02 NOTE — Assessment & Plan Note (Signed)
Patient underwent bilateral mastectomy and reconstruction with breast expanders per Dr. Migdalia Dk on 07/28/2014.  Patient reports that she did develop some right breast hematoma; and just had her final breast drainage tubes removed per Dr. Migdalia Dk Tuesday, 08/31/2014.  Patient reports that her right breast continues larger than her left; and is also more tender.  No evidence of obvious hematoma to right breast; but right breast is more edematous/larger than left breast.  Bilateral breast continue a tender with any palpation whatsoever.  No specific erythema, edema, warmth, or red streaks noted.  Patient does have previous drainage tube insertion sites to the site of her bilateral breast; with some yellow/brown discharge on dressings to sites.  No specific erythema or tenderness to insertion sites.  Patient did present back to Dr. Migdalia Dk' office earlier  today for reevaluation of her bilateral breast.  Dr. Migdalia Dk advised patient to followup either here at the [facility] or the emergency department for blood cultures.  Also of note-patient's last Herceptin infusion was 06/09/2014.  Herceptin has been held since that time do to a decrease in patient's ejection fraction down to 45-50%.  Patient has a repeat echo scheduled for 09/06/2014.  She has plans to return for follow up visit here at the [facility] on 09/10/2014.  Decision will be made at that time regarding the resumption of Herceptin infusion; depending on echo results.

## 2014-09-03 ENCOUNTER — Other Ambulatory Visit (HOSPITAL_COMMUNITY): Payer: Self-pay | Admitting: Cardiology

## 2014-09-03 DIAGNOSIS — C50919 Malignant neoplasm of unspecified site of unspecified female breast: Secondary | ICD-10-CM

## 2014-09-03 LAB — URINE CULTURE

## 2014-09-05 LAB — WOUND CULTURE

## 2014-09-05 LAB — CATH TIP CULTURE

## 2014-09-06 ENCOUNTER — Encounter (HOSPITAL_COMMUNITY): Payer: Self-pay | Admitting: *Deleted

## 2014-09-06 ENCOUNTER — Encounter (HOSPITAL_COMMUNITY): Payer: Self-pay

## 2014-09-06 ENCOUNTER — Encounter: Payer: Self-pay | Admitting: *Deleted

## 2014-09-06 ENCOUNTER — Ambulatory Visit (HOSPITAL_COMMUNITY)
Admission: RE | Admit: 2014-09-06 | Discharge: 2014-09-06 | Disposition: A | Discharge status: Home / Self Care | Payer: Medicaid Other | Source: Ambulatory Visit | Attending: Internal Medicine | Admitting: Internal Medicine

## 2014-09-06 ENCOUNTER — Ambulatory Visit (HOSPITAL_BASED_OUTPATIENT_CLINIC_OR_DEPARTMENT_OTHER)
Admission: RE | Admit: 2014-09-06 | Discharge: 2014-09-06 | Disposition: A | Discharge status: Home / Self Care | Payer: Medicaid Other | Source: Ambulatory Visit | Attending: Internal Medicine | Admitting: Internal Medicine

## 2014-09-06 VITALS — BP 126/72 | HR 95 | Wt 200.8 lb

## 2014-09-06 DIAGNOSIS — C50912 Malignant neoplasm of unspecified site of left female breast: Secondary | ICD-10-CM | POA: Diagnosis not present

## 2014-09-06 DIAGNOSIS — C50412 Malignant neoplasm of upper-outer quadrant of left female breast: Secondary | ICD-10-CM | POA: Diagnosis not present

## 2014-09-06 DIAGNOSIS — I255 Ischemic cardiomyopathy: Secondary | ICD-10-CM | POA: Diagnosis not present

## 2014-09-06 DIAGNOSIS — I509 Heart failure, unspecified: Secondary | ICD-10-CM | POA: Insufficient documentation

## 2014-09-06 DIAGNOSIS — Z171 Estrogen receptor negative status [ER-]: Secondary | ICD-10-CM | POA: Insufficient documentation

## 2014-09-06 DIAGNOSIS — Z9221 Personal history of antineoplastic chemotherapy: Secondary | ICD-10-CM | POA: Diagnosis not present

## 2014-09-06 DIAGNOSIS — I313 Pericardial effusion (noninflammatory): Secondary | ICD-10-CM | POA: Diagnosis not present

## 2014-09-06 DIAGNOSIS — I10 Essential (primary) hypertension: Secondary | ICD-10-CM | POA: Diagnosis not present

## 2014-09-06 DIAGNOSIS — Z9013 Acquired absence of bilateral breasts and nipples: Secondary | ICD-10-CM | POA: Diagnosis not present

## 2014-09-06 DIAGNOSIS — I319 Disease of pericardium, unspecified: Secondary | ICD-10-CM

## 2014-09-06 DIAGNOSIS — C50919 Malignant neoplasm of unspecified site of unspecified female breast: Secondary | ICD-10-CM

## 2014-09-06 MED ORDER — CARVEDILOL 6.25 MG PO TABS: 9.3750 mg | ORAL_TABLET | Freq: Two times a day (BID) | ORAL | Status: DC

## 2014-09-06 MED ORDER — LISINOPRIL 40 MG PO TABS: 40.0000 mg | ORAL_TABLET | Freq: Every day | ORAL | Status: DC

## 2014-09-06 NOTE — ED Provider Notes (Signed)
Medical screening examination/treatment/procedure(s) were performed by non-physician practitioner and as supervising physician I was immediately available for consultation/collaboration.  Leota Jacobsen, MD 09/06/14 1030

## 2014-09-06 NOTE — Patient Instructions (Signed)
Stop Clonidine  Increase Carvedilol to 9.375 mg (1 & 1/2 tabs) Twice daily   Your physician has requested that you have a cardiac catheterization. Cardiac catheterization is used to diagnose and/or treat various heart conditions. Doctors may recommend this procedure for a number of different reasons. The most common reason is to evaluate chest pain. Chest pain can be a symptom of coronary artery disease (CAD), and cardiac catheterization can show whether plaque is narrowing or blocking your heart's arteries. This procedure is also used to evaluate the valves, as well as measure the blood flow and oxygen levels in different parts of your heart. For further information please visit [email or web address]. Please follow instruction sheet, as given.  Your physician recommends that you schedule a follow-up appointment in: 6 weeks with an echocardiogram

## 2014-09-06 NOTE — Progress Notes (Signed)
Patient ID: Tamara [name], female   DOB: 1971/09/19, 43 y.o.   MRN: [id number]  Referring Physician: Dr. Jana Hakim Primary Care: [facility] Cardiologist: N/A Plastic Surgeon: Dr Migdalia Dk  HPI: Tamara [name] is a 43 yo with a history of HTN and chronic anemia. She was diagnosed with L breast cancer in 3/15. 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 Lymph node was positive for metastatic disease.She is S/P Bilateral Mastectomy with reconstruction 09/02/2014.   Was treated with Taxotere and carboplatinum + Herceptin/perjeta x 6 cycles - stopped in 7/15 due to low EF.   Admitted with MSSA bacteremia due to port infection. Completed IV abx. TEE in 6/15 with EF 50-55%. 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   Was seen in ER last Thursday. Concern for possible Port infection. Port removed. On abx. She has been off Herceptin since July. Last visit carvedilol was increased to 6.25 mg twice a day.Complaining of pain from tissue expanders. Mild dyspnea with exertion. No edema.    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 + inferior lateral wall aneurysm. Small pericardial effusion   SH: Does not smoke or drink FH: Mother diagnosed breast CA at age 24, living        2 paternal aunts - breast cancer (1 deceased and 1 living)        Father- Deceased at age 33 of cancer not sure what kind        - Has 41 yo, 41 yo, 41 yo, 41 yo and 41 yo (3 boys and 2 girls)   Past Medical History  Diagnosis Date  . Endometriosis   . Hypertension   . Rash     fine rash on abd  . Anemia   .  Wears glasses   . 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 Iron deficiency anemia, unspecified 02/25/2014  . BRCA1 positive     c.190T>G (p.Cys64Gly) @ [facility]   . Shortness of breath      when Hemoglobin low  . History of blood transfusion   . DVT (deep venous thrombosis)     Right Subclavian and IJ.    Current Outpatient Prescriptions  Medication Sig Dispense Refill  . acetaminophen (TYLENOL) 325 MG tablet Take 2 tablets (650 mg total) by mouth every 6 (six) hours as needed for mild pain (or Temp > 100).      . carvedilol (COREG) 6.25 MG tablet Take 1 tablet (6.25 mg total) by mouth 2 (two) times daily with a meal.  60 tablet  6  . cloNIDine (CATAPRES) 0.2 MG tablet Take 0.1 mg by mouth 2 (two) times daily.      . cyclobenzaprine (FLEXERIL) 10 MG tablet Take 1 tablet (10 mg total) by mouth 3 (three) times daily as needed for muscle spasms.  30 tablet  0  . diazepam (VALIUM) 2 MG tablet Take 2 mg by mouth every 6 (six) hours as needed for anxiety or muscle spasms.      . diphenhydrAMINE (BENADRYL) 25 mg capsule Take 25 mg by  mouth every 6 (six) hours as needed for itching.      . docusate sodium 100 MG CAPS Take 100 mg by mouth 2 (two) times daily.  10 capsule  0  . doxycycline (VIBRA-TABS) 100 MG tablet Take 1 tablet (100 mg total) by mouth 2 (two) times daily.  20 tablet  0  . enoxaparin (LOVENOX) 100 MG/ML injection Inject 0.95 mLs (95 mg total) into the skin every 12 (twelve) hours.  0 Syringe    . ferrous sulfate 325 (65 FE) MG tablet Take 325 mg by mouth 3 (three) times daily with meals.      Marland Kitchen guaiFENesin (MUCINEX) 600 MG 12 hr tablet Take 2 tablets (1,200 mg total) by mouth 2 (two) times daily.  20 tablet  0  . hydrALAZINE (APRESOLINE) 50 MG tablet Take 1 tablet (50 mg total) by mouth every 8 (eight) hours.  90 tablet  6  . HYDROcodone-homatropine (HYCODAN) 5-1.5 MG/5ML syrup Take 5 mLs by mouth every 6 (six) hours as needed for cough.  120 mL  0  . ibuprofen (ADVIL,MOTRIN) 600 MG  tablet Take 600 mg by mouth every 6 (six) hours as needed for mild pain.      Marland Kitchen ipratropium (ATROVENT) 0.03 % nasal spray Place 2 sprays into the nose 2 (two) times daily. PRN congestion  30 mL  0  . lidocaine-prilocaine (EMLA) cream Apply 1 application topically as needed (For port-a-cath.).  30 g  1  . lisinopril (PRINIVIL,ZESTRIL) 40 MG tablet Take 40 mg by mouth daily.      Marland Kitchen loratadine (CLARITIN) 10 MG tablet Take 10 mg by mouth daily as needed (For inflammation after receiving her injections during chemo.).       Marland Kitchen LORazepam (ATIVAN) 0.5 MG tablet Take 1 tablet (0.5 mg total) by mouth every 6 (six) hours as needed (Nausea or vomiting).  30 tablet  0  . ondansetron (ZOFRAN) 8 MG tablet Take 8 mg by mouth 2 (two) times daily as needed.       Marland Kitchen oxyCODONE (OXY IR/ROXICODONE) 5 MG immediate release tablet Take 15-20 mg by mouth every 4 (four) hours as needed for severe pain.      . traMADol (ULTRAM) 50 MG tablet Take 50 mg by mouth every 12 (twelve) hours as needed for moderate pain.       No current facility-administered medications for this encounter.    Allergies  Allergen Reactions  . Compazine [Prochlorperazine Edisylate] Other (See Comments)    Stuttering     Filed Vitals:   09/06/14 1107  BP: 126/72  Pulse: 95  Weight: 200 lb 12.8 oz (91.082 kg)  SpO2: 97%   PHYSICAL EXAM: General:  Well appearing. No respiratory difficulty HEENT: normal Neck: supple. no JVD. Carotids 2+ bilat; no bruits. No lymphadenopathy or thryomegaly appreciated. Cor: PMI nondisplaced. Regular rate & rhythm. No rubs, gallops or murmurs. Lungs: clear Abdomen: soft, nontender, nondistended. No hepatosplenomegaly. No bruits or masses. Good bowel sounds. Extremities: no cyanosis, clubbing, rash, edema PICC in LUE Neuro: alert & oriented x 3, cranial nerves grossly intact. moves all 4 extremities w/o difficulty. Affect pleasant.  ASSESSMENT & PLAN:  1) Cardiomyopathy:  Echocardiograms reviewed again  today. Images from today are poor. EF remains in 45-50% range. However on previous echo there is a clear lateral wall motion abnormality as well as an aneurysm in the base to mid of the inferior lateral wall. Thus my concerns is for previous infarct or other damage which preceded Herceptin.  Her EF has been otherwise stable since Herceptin and I do not think she has chemotoxicity. At this point would like to do cMRI to further evaluate but this is liekyl not possible with tissue expanders in place. I will check with Dr. Migdalia Dk. If not able to do MRI then would proceed with coronary angiogram. Will increase carvedilol to 9.375 bid. Can likely restart herceptin but will wait until after results of cath,.   2) Breast Cancer L: HER-2/neu positive with a proliferation marker Ki-67 79%. Has been off off Herceptin since July due to baseline low EF. See discussion above.   4) HTN - BP ok. Off clonidine. Will not restart. Continue carvedilol and lisinopril  5) DVT - continue Lovenox  6) Pericardial effusion - small  Will follow. Repeat echo 6 weeks.    CLEGG,AMY NP-C  10:44 AM  Patient seen and examined with Darrick Grinder, NP. We discussed all aspects of the encounter. I agree with the assessment and plan as stated above.   I have edited the note with my changes. Total time spent 35 minutes. Over half that time spent discussing above.    Amelianna Meller,MD 11:58 AM

## 2014-09-06 NOTE — Progress Notes (Signed)
  Echocardiogram 2D Echocardiogram has been performed.  Christinna Sprung FRANCES 09/06/2014, 12:10 PM

## 2014-09-06 NOTE — Progress Notes (Signed)
Wound culture from 09/02/14 was negative for growth after 2 days, as well as cath tip culture.

## 2014-09-06 NOTE — Addendum Note (Signed)
Encounter addended by: Scarlette Calico, RN on: 09/06/2014 12:20 PM<BR>     Documentation filed: Orders

## 2014-09-06 NOTE — Addendum Note (Signed)
Encounter addended by: Scarlette Calico, RN on: 09/06/2014 12:11 PM<BR>     Documentation filed: Patient Instructions Section, Medications, Orders

## 2014-09-07 ENCOUNTER — Ambulatory Visit: Payer: Medicaid Other | Admitting: Nurse Practitioner

## 2014-09-07 ENCOUNTER — Other Ambulatory Visit: Payer: Self-pay | Admitting: *Deleted

## 2014-09-07 DIAGNOSIS — C50412 Malignant neoplasm of upper-outer quadrant of left female breast: Secondary | ICD-10-CM

## 2014-09-07 MED ORDER — ENOXAPARIN SODIUM 100 MG/ML ~~LOC~~ SOLN: 95.0000 mg | Freq: Two times a day (BID) | SUBCUTANEOUS | Status: DC

## 2014-09-07 MED ORDER — LIDOCAINE-PRILOCAINE 2.5-2.5 % EX CREA: 1.0000 "application " | TOPICAL_CREAM | CUTANEOUS | Status: DC | PRN

## 2014-09-08 ENCOUNTER — Encounter: Payer: Self-pay | Admitting: Oncology

## 2014-09-08 ENCOUNTER — Telehealth: Payer: Self-pay | Admitting: *Deleted

## 2014-09-08 LAB — CULTURE, BLOOD (SINGLE)

## 2014-09-08 NOTE — Progress Notes (Signed)
Informed pharmacy that medicaid pays for brand lovenox instead of generic

## 2014-09-08 NOTE — Telephone Encounter (Signed)
Received prior authorization request for Enoxaparin from [facility].  Gave request to care management.

## 2014-09-08 NOTE — Telephone Encounter (Signed)
Prior authorization request received from [facility] [employer]. for lovenox.  Request to Managed Care.

## 2014-09-08 NOTE — Telephone Encounter (Signed)
This RN spoke with pt regarding request per MD to change appointment from 10/9 to 10/12- As well as from NP to MD schedule.  Tamara [name] states she is unable to come in on 10/12 " because Dr Haroldine Laws will be doing a heart test ( cardiac cath ) and if they find a problem then he will keep me overnight "  Per discussion plan is to keep currently scheduled appointment to follow up on recent medical events.  Tamara [name] will call this RN next week post cardiac procedure to give update.

## 2014-09-09 ENCOUNTER — Encounter (HOSPITAL_COMMUNITY): Payer: Self-pay | Admitting: Pharmacy Technician

## 2014-09-10 ENCOUNTER — Encounter: Payer: Self-pay | Admitting: Nurse Practitioner

## 2014-09-10 ENCOUNTER — Ambulatory Visit (HOSPITAL_BASED_OUTPATIENT_CLINIC_OR_DEPARTMENT_OTHER): Payer: Medicaid Other | Admitting: Nurse Practitioner

## 2014-09-10 ENCOUNTER — Telehealth (HOSPITAL_COMMUNITY): Payer: Self-pay | Admitting: Cardiology

## 2014-09-10 VITALS — BP 112/65 | HR 75 | Temp 98.5°F | Resp 18 | Ht 65.0 in | Wt 203.6 lb

## 2014-09-10 DIAGNOSIS — C50811 Malignant neoplasm of overlapping sites of right female breast: Secondary | ICD-10-CM

## 2014-09-10 DIAGNOSIS — I255 Ischemic cardiomyopathy: Secondary | ICD-10-CM

## 2014-09-10 DIAGNOSIS — G893 Neoplasm related pain (acute) (chronic): Secondary | ICD-10-CM

## 2014-09-10 DIAGNOSIS — C50412 Malignant neoplasm of upper-outer quadrant of left female breast: Secondary | ICD-10-CM

## 2014-09-10 DIAGNOSIS — I82491 Acute embolism and thrombosis of other specified deep vein of right lower extremity: Secondary | ICD-10-CM

## 2014-09-10 MED ORDER — DIAZEPAM 2 MG PO TABS: 2.0000 mg | ORAL_TABLET | Freq: Four times a day (QID) | ORAL | Status: DC | PRN

## 2014-09-10 MED ORDER — OXYCODONE HCL 5 MG PO TABS: 15.0000 mg | ORAL_TABLET | ORAL | Status: DC | PRN

## 2014-09-10 MED ORDER — OXYCODONE HCL 5 MG PO TABS: 5.0000 mg | ORAL_TABLET | ORAL | Status: DC | PRN

## 2014-09-10 NOTE — Progress Notes (Addendum)
ID: [name] OB: [birth year]  MR#: [id number]  CSN#:[id number]  PCP: Angelica Chessman, MD GYN:   SU: Dr. Erroll Luna OTHER MD: Dr. Quillian Quince Bensimhon-cardiology, Freddy Jaksch surgery, Katina Dung oncology, Herbie Baltimore Comer-infectious disease  CHIEF COMPLAINT: BRCA-1 positive patient with HER-2 positive breast cancer CURRENT TREATMENT: trastuzumab q3 weeks on hold   BREAST CANCER HISTORY:   From Dr Dana Allan original intake note:   "Patient found a left breast mass in the upper outer quadrant, evaluated with mammogram at [facility] 01-18-14 with a 3.2 cm mass in the 2:00 position 7 cm from the nipple. There was also an 8 mm mass 8 cm from the nipple in the ipsilateral breast, as well as positive lymph nodes. 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 MRI confirmed 2.8 and 1.3 cm mass is as well as lymph node. On the right a 6 mm nodule, biopsied and negative for malignancy. PET scan 3-57-01 had hypermetabolic left breast mass consistent with known neoplasm and FDG positive left axillary lymph nodes,benign-appearing brown fat activity and muscular activity in the  neck and chest but no findings for metastatic disease involving the neck, chest, abdomen, pelvis or bones. Moderate FDG activity in the endometrial canal is thought likely due to secretory phase of ovulation or menses. No mass, uterine fibroids present. CT [facility] 02-19-14 had 3 cm left breast mass, enlarged left axillary lymph nodes are positive and no CT findings for metastatic disease involving the chest,abdomen or pelvis and no evidence of osseous metastatic disease. Mildly enlarged fibroid uterus."   Her subsequent treatment is as detailed below   INTERVAL HISTORY:   Lyrick returns today for followup of her  breast cancer. Trastuzumab is currently on hold because of decreased injection fraction. Her last dose was 06/09/14.  She is status post bilateral mastectomies and left axillary sentinel lymph node biopsy, with reconstructive tissue expander placement on 07/28/14. She developed a right breast hematoma after surgery and the right breast has remained tender and edematous. On 09/02/14 she returned to our symptom management clinic because of fevers and her PICC line was removed. The left upper arm site has remained tender, but she has been fever free and finishes up her antibiotic this week. She continues on lovenox because of a previous subclavian DVT.   REVIEW OF SYSTEMS: Saranne denies nausea, vomiting, or changes is bowel or bladder habits. She reports oxycodone and oxycontin use, given to her via prescription by Dr. Migdalia Dk because of her breast pain, and is not ever constipated. She has some mild chest pain and is under evaluation by Dr. Haroldine Laws. She denies shortness of breath, cough, palpitations, or fatigue. She has some mild hot flashes. She's never had any peripheral neuropathy. A detailed review of systems is otherwise noncontributory.   PAST MEDICAL HISTORY: Past Medical History  Diagnosis Date  . Endometriosis   . Hypertension   . Rash     fine rash on abd  . Anemia   . Wears glasses   . 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 Iron deficiency anemia, unspecified 02/25/2014  . BRCA1 positive     c.190T>G (p.Cys64Gly) @ [facility]   . Shortness of breath      when Hemoglobin low  . History of blood transfusion   . DVT (deep venous thrombosis)     Right  Subclavian and IJ.    PAST SURGICAL HISTORY: Past Surgical History  Procedure Laterality Date  . Cervical polypectomy  2010  . Cesarean section      one previous  . Tubal ligation    . Portacath placement Right 02/23/2014    Procedure: INSERTION PORT-A-CATH;  Surgeon: Joyice Faster. Cornett, MD;  Location: [place];   Service: General;  Laterality: Right;  . Port-a-cath removal N/A 05/21/2014    Procedure: REMOVAL PORT-A-CATH;  Surgeon: Zenovia Jarred, MD;  Location: [place];  Service: General;  Laterality: N/A;  . Tee without cardioversion N/A 05/26/2014    Procedure: TRANSESOPHAGEAL ECHOCARDIOGRAM (TEE);  Surgeon: Larey Dresser, MD;  Location: [facility];  Service: Cardiovascular;  Laterality: N/A;  . Bilateral total mastectomy with axillary lymph node dissection Bilateral 07/28/2014    Procedure: BILATERAL TOTAL MASTECTOMY WITH LEFT AXILLARY SENTINEL LYMPH NODE BIOPSY;  Surgeon: Stark Klein, MD;  Location: [place];  Service: General;  Laterality: Bilateral;  . Breast reconstruction with placement of tissue expander and flex hd (acellular hydrated dermis) Bilateral 07/28/2014    Procedure: BILATERAL BREAST RECONSTRUCTION WITH PLACEMENT OF TISSUE EXPANDER AND FLEX HD (ACELLULAR HYDRATED DERMIS);  Surgeon: Theodoro Kos, DO;  Location: [place];  Service: Plastics;  Laterality: Bilateral;    FAMILY HISTORY Family History  Problem Relation Age of Onset  . Breast cancer Mother 21    currently 62  . Diabetes Father   . Pancreatic cancer Father 38  . Breast cancer Paternal Aunt 21    currently 52; BRCA1 positive  . Stroke Maternal Grandfather   . Cancer Paternal Aunt     unk. primary; deceased 45s  . Breast cancer Cousin     daughter of unaffected paternal aunt; dx in her 60s  [name] father died from prostate cancer the age of 39. [name] mother was diagnosed with breast cancer the age of 45. [name] father had 5 sisters, 3 of whom were diagnosed with breast cancer, 2 of them before the age of 56. The patient had one brother, no sisters. There is no history of ovarian cancer in the family.  GYNECOLOGIC HISTORY:  Menarche age 52, first live birth age 53, the patient is [place] P5. Her periods stopped at the time of chemotherapy. 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 Her husband [name] since works as an Animal nutritionist. [name] oldest child, a son, [name], is studying Therapist, occupational; the patient is a 60 year old daughter [name] is also in college. [name] younger children are 65, 78, and 53. The patient attends a Arboriculturist church   ADVANCED DIRECTIVES: Not in place   HEALTH MAINTENANCE: History  Substance Use Topics  . Smoking status: Former Smoker -- 0.25 packs/day for 15 years    Quit date: 02/18/2009  . Smokeless tobacco: Former Systems developer  . Alcohol Use: Yes     Comment: occasional   Mammogram: Colonoscopy: Bone Density Scan:  Pap Smear:  Eye Exam:  Vitamin D Level:   Lipid Panel:    Allergies  Allergen Reactions  . Compazine [Prochlorperazine Edisylate] Other (See Comments)    Stuttering    Current Outpatient Prescriptions  Medication Sig Dispense Refill  . carvedilol (COREG) 6.25 MG tablet Take 1.5 tablets (9.375 mg total) by mouth 2 (two) times daily with a meal.  90 tablet  3  . cyclobenzaprine (FLEXERIL) 10 MG tablet Take 1  tablet (10 mg total) by mouth 3 (three) times daily as needed for muscle spasms.  30 tablet  0  . diazepam (VALIUM) 2 MG tablet Take 1 tablet (2 mg total) by mouth every 6 (six) hours as needed for anxiety or muscle spasms.  30 tablet  0  . diphenhydrAMINE (BENADRYL) 25 mg capsule Take 25 mg by mouth every 6 (six) hours as needed for itching.      . docusate sodium (COLACE) 100 MG capsule Take 100 mg by mouth 2 (two) times daily as needed for mild constipation.      Marland Kitchen doxycycline (VIBRA-TABS) 100 MG tablet Take 1 tablet (100 mg total) by mouth 2 (two) times daily.  20 tablet  0  . enoxaparin (LOVENOX) 100 MG/ML injection Inject 0.95 mLs (95 mg total) into the skin every 12 (twelve) hours.  60 Syringe  0  . ferrous sulfate 325 (65 FE) MG tablet Take 325 mg by mouth 3 (three) times daily with meals.      . hydrALAZINE (APRESOLINE) 50 MG tablet Take 1  tablet (50 mg total) by mouth every 8 (eight) hours.  90 tablet  6  . ibuprofen (ADVIL,MOTRIN) 600 MG tablet Take 600 mg by mouth every 6 (six) hours as needed for mild pain.      Marland Kitchen ipratropium (ATROVENT) 0.03 % nasal spray Place 2 sprays into the nose 2 (two) times daily. PRN congestion  30 mL  0  . lisinopril (PRINIVIL,ZESTRIL) 40 MG tablet Take 1 tablet (40 mg total) by mouth daily.  30 tablet  6  . ondansetron (ZOFRAN) 8 MG tablet Take 8 mg by mouth 2 (two) times daily as needed for nausea or vomiting (taken on chemo days).       Marland Kitchen acetaminophen (TYLENOL) 325 MG tablet Take 2 tablets (650 mg total) by mouth every 6 (six) hours as needed for mild pain (or Temp > 100).      Marland Kitchen HYDROcodone-homatropine (HYCODAN) 5-1.5 MG/5ML syrup Take 5 mLs by mouth every 6 (six) hours as needed for cough.  120 mL  0  . lidocaine-prilocaine (EMLA) cream Apply 1 application topically as needed (For port-a-cath.).  30 g  1  . loratadine (CLARITIN) 10 MG tablet Take 10 mg by mouth daily as needed (For inflammation after receiving her injections during chemo.).       Marland Kitchen LORazepam (ATIVAN) 0.5 MG tablet Take 1 tablet (0.5 mg total) by mouth every 6 (six) hours as needed (Nausea or vomiting).  30 tablet  0  . oxyCODONE (OXY IR/ROXICODONE) 5 MG immediate release tablet Take 1 tablet (5 mg total) by mouth every 4 (four) hours as needed for severe pain.  30 tablet  0  . traMADol (ULTRAM) 50 MG tablet Take 50 mg by mouth every 12 (twelve) hours as needed for moderate pain.       No current facility-administered medications for this visit.    OBJECTIVE: Young-appearing African American woman in no acute distress Filed Vitals:   09/10/14 1054  BP: 112/65  Pulse: 75  Temp: 98.5 F (36.9 C)  Resp: 18     Body mass index is 33.88 kg/(m^2).      ECOG FS:1 - Symptomatic but completely ambulatory  Skin: warm, dry  HEENT: sclerae anicteric, conjunctivae pink, oropharynx clear. No thrush or mucositis.  Lymph Nodes: No  cervical or supraclavicular lymphadenopathy  Lungs: clear to auscultation bilaterally, no rales, wheezes, or rhonci  Heart: regular rate and rhythm  Abdomen: round, soft,  non tender, positive bowel sounds  Musculoskeletal: No focal spinal tenderness, no peripheral edema  Neuro: non focal, well oriented, positive affect  Breasts: defered  LAB RESULTS:  CMP     Component Value Date/Time   NA 141 09/01/2014 2049   NA 142 07/08/2014 1558   K 3.5* 09/01/2014 2049   K 3.8 07/08/2014 1558   CL 102 09/01/2014 2049   CO2 24 09/01/2014 2049   CO2 27 07/08/2014 1558   GLUCOSE 112* 09/01/2014 2049   GLUCOSE 100 07/08/2014 1558   BUN 10 09/01/2014 2049   BUN 10.0 07/08/2014 1558   CREATININE 0.82 09/01/2014 2049   CREATININE 0.7 07/08/2014 1558   CALCIUM 9.3 09/01/2014 2049   CALCIUM 9.8 07/08/2014 1558   PROT 6.1 08/07/2014 0300   PROT 7.1 07/08/2014 1558   ALBUMIN 2.7* 08/07/2014 0300   ALBUMIN 3.8 07/08/2014 1558   AST 11 08/07/2014 0300   AST 24 07/08/2014 1558   ALT 9 08/07/2014 0300   ALT 63* 07/08/2014 1558   ALKPHOS 57 08/07/2014 0300   ALKPHOS 87 07/08/2014 1558   BILITOT <0.2* 08/07/2014 0300   BILITOT 0.20 07/08/2014 1558   GFRNONAA 88* 09/01/2014 2049   GFRAA >90 09/01/2014 2049    I No results found for this basename: SPEP,  UPEP,   kappa and lambda light chains    Lab Results  Component Value Date   WBC 10.4 09/01/2014   NEUTROABS 5.8 08/06/2014   HGB 10.9* 09/01/2014   HCT 32.7* 09/01/2014   MCV 88.9 09/01/2014   PLT 339 09/01/2014        Chemistry      Component Value Date/Time   NA 141 09/01/2014 2049   NA 142 07/08/2014 1558   K 3.5* 09/01/2014 2049   K 3.8 07/08/2014 1558   CL 102 09/01/2014 2049   CO2 24 09/01/2014 2049   CO2 27 07/08/2014 1558   BUN 10 09/01/2014 2049   BUN 10.0 07/08/2014 1558   CREATININE 0.82 09/01/2014 2049   CREATININE 0.7 07/08/2014 1558      Component Value Date/Time   CALCIUM 9.3 09/01/2014 2049   CALCIUM 9.8 07/08/2014 1558   ALKPHOS 57 08/07/2014 0300   ALKPHOS 87 07/08/2014 1558    AST 11 08/07/2014 0300   AST 24 07/08/2014 1558   ALT 9 08/07/2014 0300   ALT 63* 07/08/2014 1558   BILITOT <0.2* 08/07/2014 0300   BILITOT 0.20 07/08/2014 1558       No results found for this basename: LABCA2    No components found with this basename: LABCA125    No results found for this basename: INR,  in the last 168 hours  Urinalysis    Component Value Date/Time   COLORURINE AMBER* 05/19/2014 2001   APPEARANCEUR HAZY* 05/19/2014 2001   LABSPEC 1.020 09/02/2014 1142   LABSPEC 1.022 05/19/2014 2001   PHURINE 5.5 05/19/2014 2001   GLUCOSEU Negative 09/02/2014 [place] 05/19/2014 2001   HGBUR SMALL* 05/19/2014 2001   BILIRUBINUR SMALL* 05/19/2014 2001   KETONESUR 15* 05/19/2014 2001   PROTEINUR 100* 05/19/2014 2001   UROBILINOGEN 0.2 09/02/2014 1142   UROBILINOGEN 1.0 05/19/2014 2001   NITRITE NEGATIVE 05/19/2014 2001   LEUKOCYTESUR MODERATE* 05/19/2014 2001    STUDIES: Most recent echocardiogram on 09/06/14 showed an EF of 45-50%  Dg Chest 2 View  09/01/2014   CLINICAL DATA:  Cough, fever and body aches.  EXAM: CHEST  2 VIEW  COMPARISON:  08/06/2014  FINDINGS: The PICC line is stable. The cardiac silhouette, mediastinal and hilar contours are unchanged. The lungs are clear of acute process. The bony thorax is intact.  IMPRESSION: No acute cardiopulmonary findings.   Electronically Signed   By: Kalman Jewels M.D.   On: 09/01/2014 21:14    Assessment: 43 y.o. BRCA-1 positive [place] woman  1. S/p left breast upper outer quadrant biopsy of two separate breast masses and one axillary lymph node 01/29/2014 for a , clinical mT2 N1 stage IIB, invasive ductal carcinoma, grade 2-3,  estrogen and progesterone receptor negative, with an MIB-1 between 79-100%, and HER 2 amplified  2. completed 6 cycles of carboplatin, docetaxel, trastuzumab and pertuzumab 06/30/2014 with MRI 07/07/2014 showing a complete radiologic response  3. trastuzumab to be continued to complete a year  (through March 2016); however echocardiogram 07/05/2014 showed an ejection fraction of 45-50%-- trastuzumab being held until EF recovery  4. 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   5. MRSA port infection and septicemia mid-June 2015;  Port was removed, and  PICC line placed, completed antibiotic therapy with ANCEF    6 genetic testing with the [facility] panel and was found to have a pathogenic mutation in the BRCA1 gene called c.190T>G (p.Cys64Gly  7. status post bilateral mastectomies and left axillary sentinel lymph node biopsy, with reconstructive tissue expander placement on 07/28/14    PLAN:  Lakaisha will proceed with a cardiac catheterization on Monday. All consideration of restarting trastuzumab will be on hold until after this procedure. Per Marlon Pel, RN, she will be reaching out to the patient this upcoming week to inquire about how the procedure went and will make her a follow up appointment with Dr. Jana Hakim. Her next echocardiogram is scheduled for 10/20/14.   I have refilled Kanyon's oxycodone 35m and diazepam 287mas one time prescriptions, 30 pills each. She will have these refilled in the future by Dr. SaMigdalia DkKhMonnicanderstands and agrees with the plan. She has been encouraged to call with any issues that might arise before her next visit here.   FeMarcelino DusterNP  09/10/2014 11:28 AM

## 2014-09-10 NOTE — Telephone Encounter (Signed)
Pt scheduled for [facility] on 09/13/14 Cpt code 93458 icd 9 -abnormal echo  With pts current insurance- Medicaid No pre cert req'd

## 2014-09-13 ENCOUNTER — Ambulatory Visit: Payer: Medicaid Other | Admitting: Oncology

## 2014-09-13 ENCOUNTER — Ambulatory Visit (HOSPITAL_COMMUNITY)
Admission: RE | Admit: 2014-09-13 | Discharge: 2014-09-13 | Disposition: A | Discharge status: Home / Self Care | Payer: Medicaid Other | Source: Ambulatory Visit | Attending: Internal Medicine | Admitting: Internal Medicine

## 2014-09-13 ENCOUNTER — Encounter (HOSPITAL_COMMUNITY)
Admission: RE | Disposition: A | Discharge status: Home / Self Care | Payer: Medicaid Other | Source: Ambulatory Visit | Attending: Internal Medicine

## 2014-09-13 ENCOUNTER — Telehealth: Payer: Self-pay | Admitting: Oncology

## 2014-09-13 ENCOUNTER — Other Ambulatory Visit: Payer: Self-pay

## 2014-09-13 DIAGNOSIS — C50919 Malignant neoplasm of unspecified site of unspecified female breast: Secondary | ICD-10-CM | POA: Insufficient documentation

## 2014-09-13 DIAGNOSIS — I10 Essential (primary) hypertension: Secondary | ICD-10-CM | POA: Insufficient documentation

## 2014-09-13 DIAGNOSIS — Z86718 Personal history of other venous thrombosis and embolism: Secondary | ICD-10-CM | POA: Insufficient documentation

## 2014-09-13 DIAGNOSIS — Z9013 Acquired absence of bilateral breasts and nipples: Secondary | ICD-10-CM | POA: Insufficient documentation

## 2014-09-13 DIAGNOSIS — I313 Pericardial effusion (noninflammatory): Secondary | ICD-10-CM | POA: Diagnosis not present

## 2014-09-13 DIAGNOSIS — R079 Chest pain, unspecified: Secondary | ICD-10-CM | POA: Diagnosis present

## 2014-09-13 DIAGNOSIS — Z170 Estrogen receptor positive status [ER+]: Secondary | ICD-10-CM | POA: Diagnosis not present

## 2014-09-13 DIAGNOSIS — Z79891 Long term (current) use of opiate analgesic: Secondary | ICD-10-CM | POA: Insufficient documentation

## 2014-09-13 DIAGNOSIS — Z7901 Long term (current) use of anticoagulants: Secondary | ICD-10-CM | POA: Insufficient documentation

## 2014-09-13 DIAGNOSIS — I428 Other cardiomyopathies: Secondary | ICD-10-CM | POA: Diagnosis not present

## 2014-09-13 HISTORY — PX: LEFT HEART CATHETERIZATION WITH CORONARY ANGIOGRAM: SHX5451

## 2014-09-13 LAB — PROTIME-INR
INR: 1.17 (ref 0.00–1.49)
PROTHROMBIN TIME: 14.9 s (ref 11.6–15.2)

## 2014-09-13 LAB — CBC
HEMATOCRIT: 33.1 % — AB (ref 36.0–46.0)
HEMOGLOBIN: 10.6 g/dL — AB (ref 12.0–15.0)
MCH: 28 pg (ref 26.0–34.0)
MCHC: 32 g/dL (ref 30.0–36.0)
MCV: 87.3 fL (ref 78.0–100.0)
Platelets: 381 10*3/uL (ref 150–400)
RBC: 3.79 MIL/uL — AB (ref 3.87–5.11)
RDW: 14.2 % (ref 11.5–15.5)
WBC: 6.8 10*3/uL (ref 4.0–10.5)

## 2014-09-13 LAB — BASIC METABOLIC PANEL
ANION GAP: 14 (ref 5–15)
BUN: 16 mg/dL (ref 6–23)
CHLORIDE: 103 meq/L (ref 96–112)
CO2: 24 meq/L (ref 19–32)
CREATININE: 0.9 mg/dL (ref 0.50–1.10)
Calcium: 9.7 mg/dL (ref 8.4–10.5)
GFR calc non Af Amer: 78 mL/min — ABNORMAL LOW (ref >90)
Glucose, Bld: 96 mg/dL (ref 70–99)
Potassium: 3.9 mEq/L (ref 3.7–5.3)
Sodium: 141 mEq/L (ref 137–147)

## 2014-09-13 SURGERY — LEFT HEART CATHETERIZATION WITH CORONARY ANGIOGRAM
Anesthesia: LOCAL

## 2014-09-13 MED ORDER — ONDANSETRON HCL 4 MG/2ML IJ SOLN: 4.0000 mg | Freq: Four times a day (QID) | INTRAMUSCULAR | Status: DC | PRN

## 2014-09-13 MED ORDER — SODIUM CHLORIDE 0.9 % IV SOLN
INTRAVENOUS | Status: DC
  Administered 2014-09-13: 12:00:00 via INTRAVENOUS

## 2014-09-13 MED ORDER — LIDOCAINE HCL (PF) 1 % IJ SOLN
INTRAMUSCULAR | Status: AC
  Filled 2014-09-13: qty 30

## 2014-09-13 MED ORDER — ASPIRIN 81 MG PO CHEW
81.0000 mg | CHEWABLE_TABLET | ORAL | Status: AC
  Administered 2014-09-13: 81 mg via ORAL

## 2014-09-13 MED ORDER — ACETAMINOPHEN 325 MG PO TABS
650.0000 mg | ORAL_TABLET | ORAL | Status: DC | PRN
  Administered 2014-09-13: 650 mg via ORAL

## 2014-09-13 MED ORDER — ONDANSETRON HCL 4 MG/2ML IJ SOLN
INTRAMUSCULAR | Status: AC
  Filled 2014-09-13: qty 2

## 2014-09-13 MED ORDER — HEPARIN (PORCINE) IN NACL 2-0.9 UNIT/ML-% IJ SOLN
INTRAMUSCULAR | Status: AC
  Filled 2014-09-13: qty 1000

## 2014-09-13 MED ORDER — MIDAZOLAM HCL 2 MG/2ML IJ SOLN
INTRAMUSCULAR | Status: AC
  Filled 2014-09-13: qty 2

## 2014-09-13 MED ORDER — FENTANYL CITRATE 0.05 MG/ML IJ SOLN
INTRAMUSCULAR | Status: AC
  Filled 2014-09-13: qty 2

## 2014-09-13 MED ORDER — SODIUM CHLORIDE 0.9 % IV SOLN: INTRAVENOUS | Status: DC

## 2014-09-13 MED ORDER — SODIUM CHLORIDE 0.9 % IJ SOLN: 3.0000 mL | INTRAMUSCULAR | Status: DC | PRN

## 2014-09-13 MED ORDER — ONDANSETRON HCL 4 MG/2ML IJ SOLN
4.0000 mg | Freq: Once | INTRAMUSCULAR | Status: AC
  Administered 2014-09-13: 4 mg via INTRAVENOUS

## 2014-09-13 MED ORDER — ACETAMINOPHEN 325 MG PO TABS
ORAL_TABLET | ORAL | Status: AC
  Filled 2014-09-13: qty 2

## 2014-09-13 MED ORDER — SODIUM CHLORIDE 0.9 % IV SOLN: 250.0000 mL | INTRAVENOUS | Status: DC | PRN

## 2014-09-13 MED ORDER — ASPIRIN 81 MG PO CHEW
CHEWABLE_TABLET | ORAL | Status: AC
  Filled 2014-09-13: qty 1

## 2014-09-13 MED ORDER — NITROGLYCERIN 1 MG/10 ML FOR IR/CATH LAB
INTRA_ARTERIAL | Status: AC
  Filled 2014-09-13: qty 10

## 2014-09-13 MED ORDER — SODIUM CHLORIDE 0.9 % IJ SOLN: 3.0000 mL | Freq: Two times a day (BID) | INTRAMUSCULAR | Status: DC

## 2014-09-13 NOTE — Progress Notes (Signed)
PT RESTING QUIETLY IN BED, NO ACUTE DISTRESS. HOB FLAT, ADVISED PT TO REMAIN FLAT AFTER SHEATH PULL UNTIL INSTRUCTED. BANDAGE AT R GROIN REMOVED, 5 FR SHEATH REMOVED FROM INSERTION, TIP INTACT. NO OBVIOUS HEMATOMA FORMATION NOTED, BLEEDING CONTROLLED. NO DISCOMFORT REPORTED.

## 2014-09-13 NOTE — Progress Notes (Signed)
TRANSPORTED TO SS PER STRETCHER IN NO ACUTE DISTRESS. CATH SITE INTACT, NO BLEEDING, NO HEMATOMA. HOB AT 20.

## 2014-09-13 NOTE — Progress Notes (Signed)
Dr Jeffie Pollock was contacted, pt c/o nausea due to morning meds, orders followed.

## 2014-09-13 NOTE — CV Procedure (Signed)
Cardiac Cath Procedure Note:  Indication: Chest pain. Abnormal echo  Procedures performed:  1) Selective coronary angiography 2) Left heart catheterization 3) Left ventriculogram  Description of procedure:   The risks and indication of the procedure were explained. Consent was signed and placed on the chart. An appropriate timeout was taken prior to the procedure. The right groin was prepped and draped in the routine sterile fashion and anesthetized with 1% local lidocaine.   A 4FR arterial sheath was placed in the right femoral artery using a modified Seldinger technique. Standard catheters including a JL4, JR4 and angled pigtail were used. All catheter exchanges were made over a wire.  Complications:  None apparent  Findings:  Ao Pressure: 131/79 (102) LV Pressure:  156/11/18 There was no signficant gradient across the aortic valve on pullback.  Left main: Normal  LAD: Wraps apex. Normal  LCX: Gives off moderate size ramus and 2 OMs. Normal.  RCA: Dominant vessel. Normal  LV-gram done in the RAO and LAO projections: Ejection fraction = 55% no evidence of inferolateral wall motion abnormality or aneurysm seen on echo   Assessment:  1. Normal coronaries 2. Normal LV with EF 55%  Plan/Discussion:  Continue medical therapy.   Glori Bickers MD 2:18 PM

## 2014-09-13 NOTE — H&P (View-Only) (Signed)
Patient ID: Tamara [name], female   DOB: 1971/09/19, 43 y.o.   MRN: [id number]  Referring Physician: Dr. Jana Hakim Primary Care: [facility] Primary Cardiologist: N/A Plastic Surgeon: Dr Migdalia Dk  HPI: Tamara [name] is a 43 yo with a history of HTN and chronic anemia. She was diagnosed with L breast cancer in 3/15. 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 Lymph node was positive for metastatic disease.She is S/P Bilateral Mastectomy with reconstruction 09/02/2014.   Was treated with Taxotere and carboplatinum + Herceptin/perjeta x 6 cycles - stopped in 7/15 due to low EF.   Admitted with MSSA bacteremia due to port infection. Completed IV abx. TEE in 6/15 with EF 50-55%. 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   Was seen in ER last Thursday. Concern for possible Port infection. Port removed. On abx. She has been off Herceptin since July. Last visit carvedilol was increased to 6.25 mg twice a day.Complaining of pain from tissue expanders. Mild dyspnea with exertion. No edema.    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 + inferior lateral wall aneurysm. Small pericardial effusion   SH: Does not smoke or drink FH: Mother diagnosed breast CA at age 24, living        2 paternal aunts - breast cancer (1 deceased and 1 living)        Father- Deceased at age 33 of cancer not sure what kind        - Has 41 yo, 41 yo, 41 yo, 41 yo and 41 yo (3 boys and 2 girls)   Past Medical History  Diagnosis Date  . Endometriosis   . Hypertension   . Rash     fine rash on abd  . Anemia   .  Wears glasses   . 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 Iron deficiency anemia, unspecified 02/25/2014  . BRCA1 positive     c.190T>G (p.Cys64Gly) @ [facility]   . Shortness of breath      when Hemoglobin low  . History of blood transfusion   . DVT (deep venous thrombosis)     Right Subclavian and IJ.    Current Outpatient Prescriptions  Medication Sig Dispense Refill  . acetaminophen (TYLENOL) 325 MG tablet Take 2 tablets (650 mg total) by mouth every 6 (six) hours as needed for mild pain (or Temp > 100).      . carvedilol (COREG) 6.25 MG tablet Take 1 tablet (6.25 mg total) by mouth 2 (two) times daily with a meal.  60 tablet  6  . cloNIDine (CATAPRES) 0.2 MG tablet Take 0.1 mg by mouth 2 (two) times daily.      . cyclobenzaprine (FLEXERIL) 10 MG tablet Take 1 tablet (10 mg total) by mouth 3 (three) times daily as needed for muscle spasms.  30 tablet  0  . diazepam (VALIUM) 2 MG tablet Take 2 mg by mouth every 6 (six) hours as needed for anxiety or muscle spasms.      . diphenhydrAMINE (BENADRYL) 25 mg capsule Take 25 mg by  mouth every 6 (six) hours as needed for itching.      . docusate sodium 100 MG CAPS Take 100 mg by mouth 2 (two) times daily.  10 capsule  0  . doxycycline (VIBRA-TABS) 100 MG tablet Take 1 tablet (100 mg total) by mouth 2 (two) times daily.  20 tablet  0  . enoxaparin (LOVENOX) 100 MG/ML injection Inject 0.95 mLs (95 mg total) into the skin every 12 (twelve) hours.  0 Syringe    . ferrous sulfate 325 (65 FE) MG tablet Take 325 mg by mouth 3 (three) times daily with meals.      Marland Kitchen guaiFENesin (MUCINEX) 600 MG 12 hr tablet Take 2 tablets (1,200 mg total) by mouth 2 (two) times daily.  20 tablet  0  . hydrALAZINE (APRESOLINE) 50 MG tablet Take 1 tablet (50 mg total) by mouth every 8 (eight) hours.  90 tablet  6  . HYDROcodone-homatropine (HYCODAN) 5-1.5 MG/5ML syrup Take 5 mLs by mouth every 6 (six) hours as needed for cough.  120 mL  0  . ibuprofen (ADVIL,MOTRIN) 600 MG  tablet Take 600 mg by mouth every 6 (six) hours as needed for mild pain.      Marland Kitchen ipratropium (ATROVENT) 0.03 % nasal spray Place 2 sprays into the nose 2 (two) times daily. PRN congestion  30 mL  0  . lidocaine-prilocaine (EMLA) cream Apply 1 application topically as needed (For port-a-cath.).  30 g  1  . lisinopril (PRINIVIL,ZESTRIL) 40 MG tablet Take 40 mg by mouth daily.      Marland Kitchen loratadine (CLARITIN) 10 MG tablet Take 10 mg by mouth daily as needed (For inflammation after receiving her injections during chemo.).       Marland Kitchen LORazepam (ATIVAN) 0.5 MG tablet Take 1 tablet (0.5 mg total) by mouth every 6 (six) hours as needed (Nausea or vomiting).  30 tablet  0  . ondansetron (ZOFRAN) 8 MG tablet Take 8 mg by mouth 2 (two) times daily as needed.       Marland Kitchen oxyCODONE (OXY IR/ROXICODONE) 5 MG immediate release tablet Take 15-20 mg by mouth every 4 (four) hours as needed for severe pain.      . traMADol (ULTRAM) 50 MG tablet Take 50 mg by mouth every 12 (twelve) hours as needed for moderate pain.       No current facility-administered medications for this encounter.    Allergies  Allergen Reactions  . Compazine [Prochlorperazine Edisylate] Other (See Comments)    Stuttering     Filed Vitals:   09/06/14 1107  BP: 126/72  Pulse: 95  Weight: 200 lb 12.8 oz (91.082 kg)  SpO2: 97%   PHYSICAL EXAM: General:  Well appearing. No respiratory difficulty HEENT: normal Neck: supple. no JVD. Carotids 2+ bilat; no bruits. No lymphadenopathy or thryomegaly appreciated. Cor: PMI nondisplaced. Regular rate & rhythm. No rubs, gallops or murmurs. Lungs: clear Abdomen: soft, nontender, nondistended. No hepatosplenomegaly. No bruits or masses. Good bowel sounds. Extremities: no cyanosis, clubbing, rash, edema PICC in LUE Neuro: alert & oriented x 3, cranial nerves grossly intact. moves all 4 extremities w/o difficulty. Affect pleasant.  ASSESSMENT & PLAN:  1) Cardiomyopathy:  Echocardiograms reviewed again  today. Images from today are poor. EF remains in 45-50% range. However on previous echo there is a clear lateral wall motion abnormality as well as an aneurysm in the base to mid of the inferior lateral wall. Thus my concerns is for previous infarct or other damage which preceded Herceptin.  Her EF has been otherwise stable since Herceptin and I do not think she has chemotoxicity. At this point would like to do cMRI to further evaluate but this is liekyl not possible with tissue expanders in place. I will check with Dr. Migdalia Dk. If not able to do MRI then would proceed with coronary angiogram. Will increase carvedilol to 9.375 bid. Can likely restart herceptin but will wait until after results of cath,.   2) Breast Cancer L: HER-2/neu positive with a proliferation marker Ki-67 79%. Has been off off Herceptin since July due to baseline low EF. See discussion above.   4) HTN - BP ok. Off clonidine. Will not restart. Continue carvedilol and lisinopril  5) DVT - continue Lovenox  6) Pericardial effusion - small  Will follow. Repeat echo 6 weeks.    CLEGG,AMY NP-C  10:44 AM  Patient seen and examined with Darrick Grinder, NP. We discussed all aspects of the encounter. I agree with the assessment and plan as stated above.   I have edited the note with my changes. Total time spent 35 minutes. Over half that time spent discussing above.    Daniel Bensimhon,MD 11:58 AM

## 2014-09-13 NOTE — Telephone Encounter (Signed)
PER POF TO [facility] APPT-CALLING & MAILED PT COPY OF [facility]

## 2014-09-13 NOTE — Interval H&P Note (Signed)
History and Physical Interval Note:  09/13/2014 1:57 PM  Dyanara Cozza  has presented today for surgery, with the diagnosis of abnormal echo  The various methods of treatment have been discussed with the patient and family. After consideration of risks, benefits and other options for treatment, the patient has consented to  Procedure(s): LEFT HEART CATHETERIZATION WITH CORONARY ANGIOGRAM (N/A) and possible angioplastyCath Lab Visit (complete for each Cath Lab visit)  Clinical Evaluation Leading to the Procedure:   ACS: No.  Non-ACS:    Anginal Classification: CCS II  Anti-ischemic medical therapy: Minimal Therapy (1 class of medications)  Non-Invasive Test Results: No non-invasive testing performed  Prior CABG: No previous CABG       as a surgical intervention .  [name] history has been reviewed, patient examined, no change in status, stable for surgery.  I have reviewed the patient's chart and labs.  Questions were answered to the patient's satisfaction.     Daniel Bensimhon

## 2014-09-13 NOTE — Progress Notes (Signed)
RESTING QUIETLY IN BED NO ACUTE DISTRESS. NO CHEST PAIN, NO SOB. CATH SITE AT R GROIN INTACT, BANDAGE CLEAN/DRY, NO EVIDENCE OF BLEEDING/HEMATOMA. DENIES NEED TO VOID AT THIS TIME. REPORT CALLED TO SS STAFF.

## 2014-09-13 NOTE — Discharge Instructions (Signed)
Angiogram, Care After Refer to this sheet in the next few weeks. These instructions provide you with information on caring for yourself after your procedure. Your health care provider may also give you more specific instructions. Your treatment has been planned according to current medical practices, but problems sometimes occur. Call your health care provider if you have any problems or questions after your procedure.  WHAT TO EXPECT AFTER THE PROCEDURE After your procedure, it is typical to have the following sensations:  Minor discomfort or tenderness and a small bump at the catheter insertion site. The bump should usually decrease in size and tenderness within 1 to 2 weeks.  Any bruising will usually fade within 2 to 4 weeks. HOME CARE INSTRUCTIONS   You may need to keep taking blood thinners if they were prescribed for you. Take medicines only as directed by your health care provider.  Do not apply powder or lotion to the site.  Do not take baths, swim, or use a hot tub until your health care provider approves.  You may shower 24 hours after the procedure. Remove the bandage (dressing) and gently wash the site with plain soap and water. Gently pat the site dry.  Inspect the site at least twice daily.  Limit your activity for the first 48 hours. Do not bend, squat, or lift anything over 20 lb (9 kg) or as directed by your health care provider.  Plan to have someone take you home after the procedure. Follow instructions about when you can drive or return to work. SEEK MEDICAL CARE IF:  You get light-headed when standing up.  You have drainage (other than a small amount of blood on the dressing).  You have chills.  You have a fever.  You have redness, warmth, swelling, or pain at the insertion site. SEEK IMMEDIATE MEDICAL CARE IF:   You develop chest pain or shortness of breath, feel faint, or pass out.  You have bleeding, swelling larger than a walnut, or drainage from the  catheter insertion site.  You develop pain, discoloration, coldness, or severe bruising in the leg or arm that held the catheter.  You develop bleeding from any other place, such as the bowels. You may see bright red blood in your urine or stools, or your stools may appear black and tarry.  You have heavy bleeding from the site. If this happens, hold pressure on the site. Call 911  Understand these instructions.  Will watch your condition.  Will get help right away if you are not doing well or get worse. Document Released: 06/07/2005 Document Revised: 04/05/2014 Document Reviewed: 04/13/2013 [facility] Patient Information 2015 [place], [place]. This information is not intended to replace advice given to you by your health care provider. Make sure you discuss any questions you have with your health care provider.

## 2014-09-13 NOTE — Progress Notes (Signed)
PRESSURE RELEASED AT R GROIN, NO COMPLICATIONS. NO BLEEDING, NO HEMATOMA. DENIES NEED TO VOID. HOB AT 20, TAKING LUNCH. GAUZE BANDAGE PLACED AT GROIN WITH OPSITE COVERING. PEDAL PULSES MODERATE. NO DISCOMFORT.

## 2014-09-14 ENCOUNTER — Telehealth (HOSPITAL_COMMUNITY): Payer: Self-pay | Admitting: Vascular Surgery

## 2014-09-14 NOTE — Telephone Encounter (Signed)
Pt needs note for her son to go back to school after her procedure

## 2014-09-15 ENCOUNTER — Encounter (HOSPITAL_COMMUNITY): Payer: Self-pay | Admitting: Cardiology

## 2014-09-15 ENCOUNTER — Encounter (HOSPITAL_COMMUNITY): Payer: Self-pay | Admitting: Internal Medicine

## 2014-09-15 NOTE — Telephone Encounter (Signed)
Will send to provider for review Son came into town for cath

## 2014-09-16 NOTE — Progress Notes (Signed)
No show

## 2014-10-04 ENCOUNTER — Encounter: Payer: Self-pay | Admitting: Nurse Practitioner

## 2014-10-08 ENCOUNTER — Encounter (HOSPITAL_BASED_OUTPATIENT_CLINIC_OR_DEPARTMENT_OTHER): Payer: Self-pay | Admitting: *Deleted

## 2014-10-08 NOTE — Progress Notes (Signed)
To bring all meds,overnight bag-said she may want to stay-may also have tummy tuck Labs 30 days-can do istat if [place]

## 2014-10-11 ENCOUNTER — Telehealth: Payer: Self-pay | Admitting: *Deleted

## 2014-10-11 ENCOUNTER — Telehealth: Payer: Self-pay | Admitting: Oncology

## 2014-10-11 ENCOUNTER — Ambulatory Visit (HOSPITAL_BASED_OUTPATIENT_CLINIC_OR_DEPARTMENT_OTHER): Payer: Medicaid Other | Admitting: Oncology

## 2014-10-11 VITALS — BP 148/89 | HR 66 | Temp 97.9°F | Resp 20 | Ht 65.0 in | Wt 205.0 lb

## 2014-10-11 DIAGNOSIS — I82C11 Acute embolism and thrombosis of right internal jugular vein: Secondary | ICD-10-CM

## 2014-10-11 DIAGNOSIS — C50412 Malignant neoplasm of upper-outer quadrant of left female breast: Secondary | ICD-10-CM

## 2014-10-11 DIAGNOSIS — Z1501 Genetic susceptibility to malignant neoplasm of breast: Secondary | ICD-10-CM

## 2014-10-11 DIAGNOSIS — Z1509 Genetic susceptibility to other malignant neoplasm: Principal | ICD-10-CM

## 2014-10-11 DIAGNOSIS — C773 Secondary and unspecified malignant neoplasm of axilla and upper limb lymph nodes: Secondary | ICD-10-CM

## 2014-10-11 DIAGNOSIS — I82402 Acute embolism and thrombosis of unspecified deep veins of left lower extremity: Secondary | ICD-10-CM

## 2014-10-11 NOTE — Telephone Encounter (Signed)
Per staff message and POF I have tried to schedule appts. Sent MD message and scheduler, waiting to see if patient is receiving one or two drugs.  JMW

## 2014-10-11 NOTE — Progress Notes (Signed)
ID: Tamara [name] OB: [birth year]  MR#: [id number]  CSN#:[id number]  PCP: Angelica Chessman, MD GYN:   SU: Dr. Erroll Luna OTHER MD: Dr. 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 There was also an 8 mm mass 8 cm from the nipple in the ipsilateral breast, as well as positive lymph nodes. 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 MRI confirmed 2.8 and 1.3 cm mass is as well as lymph node. On the right a 6 mm nodule, biopsied and negative for malignancy. PET scan 05-23-29 had hypermetabolic left breast mass consistent with known neoplasm and FDG positive left axillary lymph nodes,benign-appearing brown fat activity and muscular activity in the  neck and chest but no findings for metastatic disease involving the neck, chest, abdomen, pelvis or bones. Moderate FDG activity in the endometrial canal is thought likely due to secretory phase of ovulation or menses. No mass, uterine fibroids present. CT CAP 02-19-14 had 3 cm left breast mass, enlarged left axillary lymph nodes are positive and no CT findings for metastatic disease involving the chest,abdomen or pelvis and no evidence of osseous metastatic disease. Mildly enlarged fibroid uterus."   Her subsequent treatment is as detailed below   INTERVAL HISTORY:   [name] returns today for followup of her  breast cancer. To summarize the recent history: she completed her 6 cycles of carboplatin and docetaxel 06/30/2014. She received trastuzumab and pertuzumab with the first 5 cycles. There was a milddrop in her ejection fraction after cycle 4, and cardio oncology surveillance was increased. She receive trastuzumab and pertuzumab on cycle 6 06/30/2014, but on 07/05/2014 repeat echocardiogram showed a further drop in her ejection fraction to 45-50%. Trastuzumab was held and she underwent cardiac catheterization 10/12/2015showing normal coronary arteries and a normal left ventricular function with an ejection fraction of 55%. The patient now presents to consider resumption of her trastuzumab treatment.  REVIEW OF SYSTEMS: [place] Is doing much better, although she is still having some chest wall tension from her prior surgery. She sleeps poorly as a result. She is having some hot flashes. A detailed review of systems today was otherwise noncontributory.  PAST MEDICAL HISTORY: Past Medical History  Diagnosis Date  . Endometriosis   . Hypertension   . Rash     fine rash on abd  . Anemia   . Wears glasses   . 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 Iron deficiency anemia, unspecified 02/25/2014  . BRCA1 positive     c.190T>G (p.Cys64Gly) @ [facility]   . Shortness of breath      when Hemoglobin low  . History of blood transfusion   . DVT (deep venous thrombosis)     Right Subclavian and IJ.    PAST SURGICAL HISTORY: Past Surgical History  Procedure Laterality Date  . Cervical polypectomy  2010  . Cesarean section      one previous  . Tubal ligation    .  Portacath placement Right 02/23/2014    Procedure: INSERTION PORT-A-CATH;  Surgeon: Joyice Faster. Cornett, MD;  Location: [place];  Service: General;  Laterality: Right;  . Port-a-cath removal N/A 05/21/2014    Procedure: REMOVAL PORT-A-CATH;  Surgeon: Zenovia Jarred, MD;  Location: [place];  Service: General;  Laterality: N/A;  .  Tee without cardioversion N/A 05/26/2014    Procedure: TRANSESOPHAGEAL ECHOCARDIOGRAM (TEE);  Surgeon: Larey Dresser, MD;  Location: [facility];  Service: Cardiovascular;  Laterality: N/A;  . Bilateral total mastectomy with axillary lymph node dissection Bilateral 07/28/2014    Procedure: BILATERAL TOTAL MASTECTOMY WITH LEFT AXILLARY SENTINEL LYMPH NODE BIOPSY;  Surgeon: Stark Klein, MD;  Location: [place];  Service: General;  Laterality: Bilateral;  . Breast reconstruction with placement of tissue expander and flex hd (acellular hydrated dermis) Bilateral 07/28/2014    Procedure: BILATERAL BREAST RECONSTRUCTION WITH PLACEMENT OF TISSUE EXPANDER AND FLEX HD (ACELLULAR HYDRATED DERMIS);  Surgeon: Theodoro Kos, DO;  Location: [place];  Service: Plastics;  Laterality: Bilateral;    FAMILY HISTORY Family History  Problem Relation Age of Onset  . Breast cancer Mother 73    currently 46  . Diabetes Father   . Pancreatic cancer Father 66  . Breast cancer Paternal Aunt 76    currently 5; BRCA1 positive  . Stroke Maternal Grandfather   . Cancer Paternal Aunt     unk. primary; deceased 37s  . Breast cancer Cousin     daughter of unaffected paternal aunt; dx in her 49s  [name] father died from prostate cancer the age of 47. [name] mother was diagnosed with breast cancer the age of 86. [name] father had 5 sisters, 3 of whom were diagnosed with breast cancer, 2 of them before the age of 30. The patient had one brother, no sisters. There is no history of ovarian cancer in the family.  GYNECOLOGIC HISTORY:  Menarche age 86, first live birth age 47, the patient is [place] P5. Her periods stopped at the time of chemotherapy. 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 Her husband [name] since works as an Animal nutritionist. [name] oldest child, a son, [name], is studying Therapist, occupational; the patient is a 43 year old  daughter [name] is also in college. [name] younger children are 61, 16, and 60. The patient attends a Arboriculturist church   ADVANCED DIRECTIVES: Not in place   HEALTH MAINTENANCE: History  Substance Use Topics  . Smoking status: Former Smoker -- 0.25 packs/day for 15 years    Quit date: 02/18/2009  . Smokeless tobacco: Former Systems developer  . Alcohol Use: Yes     Comment: occasional   Mammogram: Colonoscopy: Bone Density Scan:  Pap Smear:  Eye Exam:  Vitamin D Level:   Lipid Panel:    Allergies  Allergen Reactions  . Compazine [Prochlorperazine Edisylate] Other (See Comments)    Stuttering    Current Outpatient Prescriptions  Medication Sig Dispense Refill  . acetaminophen (TYLENOL) 325 MG tablet Take 2 tablets (650 mg total) by mouth every 6 (six) hours as needed for mild pain (or Temp > 100).    . carvedilol (COREG) 6.25 MG tablet Take 1.5 tablets (9.375 mg total) by mouth 2 (two) times daily with a meal. 90 tablet 3  . cyclobenzaprine (FLEXERIL) 10 MG tablet Take 1 tablet (10 mg total) by mouth 3 (three) times daily as needed for  muscle spasms. 30 tablet 0  . diazepam (VALIUM) 2 MG tablet Take 1 tablet (2 mg total) by mouth every 6 (six) hours as needed for anxiety or muscle spasms. 30 tablet 0  . diphenhydrAMINE (BENADRYL) 25 mg capsule Take 25 mg by mouth every 6 (six) hours as needed for itching.    . docusate sodium (COLACE) 100 MG capsule Take 100 mg by mouth 2 (two) times daily as needed for mild constipation.    . enoxaparin (LOVENOX) 100 MG/ML injection Inject 0.95 mLs (95 mg total) into the skin every 12 (twelve) hours. 60 Syringe 0  . ferrous sulfate 325 (65 FE) MG tablet Take 325 mg by mouth 3 (three) times daily with meals.    . hydrALAZINE (APRESOLINE) 50 MG tablet Take 1 tablet (50 mg total) by mouth every 8 (eight) hours. 90 tablet 6  . HYDROcodone-homatropine (HYCODAN) 5-1.5 MG/5ML syrup Take 5 mLs by mouth every 6 (six) hours as needed for cough. 120 mL 0   . ibuprofen (ADVIL,MOTRIN) 600 MG tablet Take 600 mg by mouth every 6 (six) hours as needed for mild pain.    Marland Kitchen ipratropium (ATROVENT) 0.03 % nasal spray Place 2 sprays into the nose 2 (two) times daily. PRN congestion 30 mL 0  . lidocaine-prilocaine (EMLA) cream Apply 1 application topically as needed (For port-a-cath.). 30 g 1  . lisinopril (PRINIVIL,ZESTRIL) 40 MG tablet Take 1 tablet (40 mg total) by mouth daily. 30 tablet 6  . loratadine (CLARITIN) 10 MG tablet Take 10 mg by mouth daily as needed (For inflammation after receiving her injections during chemo.).     Marland Kitchen LORazepam (ATIVAN) 0.5 MG tablet Take 1 tablet (0.5 mg total) by mouth every 6 (six) hours as needed (Nausea or vomiting). 30 tablet 0  . ondansetron (ZOFRAN) 8 MG tablet Take 8 mg by mouth 2 (two) times daily as needed for nausea or vomiting (taken on chemo days).     Marland Kitchen oxyCODONE (OXY IR/ROXICODONE) 5 MG immediate release tablet Take 1 tablet (5 mg total) by mouth every 4 (four) hours as needed for severe pain. 30 tablet 0  . traMADol (ULTRAM) 50 MG tablet Take 50 mg by mouth every 12 (twelve) hours as needed for moderate pain.     No current facility-administered medications for this visit.    OBJECTIVE: Young-appearing African American woman Who appears stated age 82 Vitals:   10/11/14 1112  BP: 148/89  Pulse: 66  Temp: 97.9 F (36.6 C)  Resp: 20     Body mass index is 34.11 kg/(m^2).      ECOG FS:1 - Symptomatic but completely ambulatory  Sclerae unicteric, pupils equal and reactive Oropharynx clear and moist-- no thrush No cervical or supraclavicular adenopathy Lungs no rales or rhonchi Heart regular rate and rhythm Abd soft, nontender, positive bowel sounds MSK no focal spinal tenderness, no upper extremity lymphedema Neuro: nonfocal, well oriented, appropriate affect Breasts: status post bilateral mastectomies with expanders in place. There is no evidence of local recurrence. Both axillae are  benign.   LAB RESULTS:  CMP     Component Value Date/Time   NA 141 09/13/2014 1200   NA 142 07/08/2014 1558   K 3.9 09/13/2014 1200   K 3.8 07/08/2014 1558   CL 103 09/13/2014 1200   CO2 24 09/13/2014 1200   CO2 27 07/08/2014 1558   GLUCOSE 96 09/13/2014 1200   GLUCOSE 100 07/08/2014 1558   BUN 16 09/13/2014 1200   BUN 10.0 07/08/2014 1558  CREATININE 0.90 09/13/2014 1200   CREATININE 0.7 07/08/2014 1558   CALCIUM 9.7 09/13/2014 1200   CALCIUM 9.8 07/08/2014 1558   PROT 6.1 08/07/2014 0300   PROT 7.1 07/08/2014 1558   ALBUMIN 2.7* 08/07/2014 0300   ALBUMIN 3.8 07/08/2014 1558   AST 11 08/07/2014 0300   AST 24 07/08/2014 1558   ALT 9 08/07/2014 0300   ALT 63* 07/08/2014 1558   ALKPHOS 57 08/07/2014 0300   ALKPHOS 87 07/08/2014 1558   BILITOT <0.2* 08/07/2014 0300   BILITOT 0.20 07/08/2014 1558   GFRNONAA 78* 09/13/2014 1200   GFRAA >90 09/13/2014 1200    I No results found for: SPEP  Lab Results  Component Value Date   WBC 6.8 09/13/2014   NEUTROABS 5.8 08/06/2014   HGB 10.6* 09/13/2014   HCT 33.1* 09/13/2014   MCV 87.3 09/13/2014   PLT 381 09/13/2014        Chemistry      Component Value Date/Time   NA 141 09/13/2014 1200   NA 142 07/08/2014 1558   K 3.9 09/13/2014 1200   K 3.8 07/08/2014 1558   CL 103 09/13/2014 1200   CO2 24 09/13/2014 1200   CO2 27 07/08/2014 1558   BUN 16 09/13/2014 1200   BUN 10.0 07/08/2014 1558   CREATININE 0.90 09/13/2014 1200   CREATININE 0.7 07/08/2014 1558      Component Value Date/Time   CALCIUM 9.7 09/13/2014 1200   CALCIUM 9.8 07/08/2014 1558   ALKPHOS 57 08/07/2014 0300   ALKPHOS 87 07/08/2014 1558   AST 11 08/07/2014 0300   AST 24 07/08/2014 1558   ALT 9 08/07/2014 0300   ALT 63* 07/08/2014 1558   BILITOT <0.2* 08/07/2014 0300   BILITOT 0.20 07/08/2014 1558       No results found for: LABCA2  No components found for: LABCA125  No results for input(s): INR in the last 168 hours.  Urinalysis     Component Value Date/Time   COLORURINE AMBER* 05/19/2014 2001   APPEARANCEUR HAZY* 05/19/2014 2001   LABSPEC 1.020 09/02/2014 1142   LABSPEC 1.022 05/19/2014 2001   PHURINE 5.5 05/19/2014 2001   GLUCOSEU Negative 09/02/2014 1142   GLUCOSEU NEGATIVE 05/19/2014 2001   HGBUR SMALL* 05/19/2014 2001   BILIRUBINUR SMALL* 05/19/2014 2001   KETONESUR 15* 05/19/2014 2001   PROTEINUR 100* 05/19/2014 2001   UROBILINOGEN 0.2 09/02/2014 1142   UROBILINOGEN 1.0 05/19/2014 2001   NITRITE NEGATIVE 05/19/2014 2001   LEUKOCYTESUR MODERATE* 05/19/2014 2001    STUDIES: Most recent echocardiogram on 09/06/14 showed an EF of 45-50%; however echocardiogram the following week showed a normal ejection fraction  Assessment: 43 y.o. BRCA-1 positive [place] woman  1. S/p left breast upper outer quadrant biopsy of two separate breast masses and one axillary lymph node 01/29/2014 for a , clinical mT2 N1 stage IIB, invasive ductal carcinoma, grade 2-3,  estrogen and progesterone receptor negative, with an MIB-1 between 79-100%, and HER 2 amplified  2. completed 6 cycles of carboplatin, docetaxel, trastuzumab and pertuzumab 06/30/2014 with MRI 07/07/2014 showing a complete radiologic response  3. trastuzumab was to be continued to complete a year (through March 2016); however echocardiogram 07/05/2014 showed an ejection fraction of 45-50%-- trastuzumab was held after 06/30/2014 doseuntil EF recovery  (a) cath report from 09/13/2014 shows normal coronaries and a normal left ventricular function with an ejection fraction of 55%.  4. 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   5. MRSA port infection  and septicemia mid-June 2015;  Port was removed, and  PICC line placed, completed antibiotic therapy with ANCEF; PICC subsequently pulled   6 genetic testing with the [facility] panel and was found to have a pathogenic mutation in the BRCA1 gene called c.190T>G  (p.Cys64Gly  7. status post bilateral mastectomies and left axillary sentinel lymph node biopsy, withimmediate expander placement, on 07/28/14; the pathology ([id number]) showed a complete pathologic response in the left breast and the 3 sentinel lymph nodes sampled; the right breast was benign  8. Bilateral salpingo-oophorectomy pending.    PLAN:  Allahna may be able to resume her trastuzumab treatments, but I am deferring to Dr. Haroldine Laws. Her port is out and she will need her PICC to be replaced. We discussed the pros and cons of that given her prior history of a clot, and I have set her up for PICC placement on the left side December 7. Assuming we get clearance from cardiology we will resume trastuzumab December 8. She has a good understanding of the possible toxicities, side effects and complications of that agent as well as the possible benefits.  Once we resume trastuzumab of course she will require a close follow-up from cardiology. If possible we would like to complete 9 additional months of trastuzumab.  She will need to have bilateral salpingo-oophorectomy at some point. She would like to defer that for now. Whenever she decides to further explore the possibility we will refer her to Dr. Denman George here.  Pricilla Handler has a good understanding of the overall plan. She agrees with it. She knows the goal of treatment in her case is cure. She will call with any problems that may develop before her next visit here, which will be in approximately 3 months  [name] C, MD  10/11/2014 11:22 AM

## 2014-10-11 NOTE — Telephone Encounter (Signed)
per pof to sch pt appt-sch pt appt-adv IR will call to sch appt-sent eamil to MW to get trmt sch-pt aware of sch time for appt-will mail copy of sch after MW sch

## 2014-10-14 ENCOUNTER — Encounter (HOSPITAL_BASED_OUTPATIENT_CLINIC_OR_DEPARTMENT_OTHER)
Admission: RE | Disposition: A | Discharge status: Home / Self Care | Payer: Self-pay | Source: Ambulatory Visit | Attending: Plastic Surgery

## 2014-10-14 ENCOUNTER — Ambulatory Visit (HOSPITAL_BASED_OUTPATIENT_CLINIC_OR_DEPARTMENT_OTHER): Payer: Medicaid Other | Admitting: Anesthesiology

## 2014-10-14 ENCOUNTER — Encounter (HOSPITAL_BASED_OUTPATIENT_CLINIC_OR_DEPARTMENT_OTHER): Payer: Self-pay | Admitting: Plastic Surgery

## 2014-10-14 ENCOUNTER — Ambulatory Visit (HOSPITAL_BASED_OUTPATIENT_CLINIC_OR_DEPARTMENT_OTHER)
Admission: RE | Admit: 2014-10-14 | Discharge: 2014-10-14 | Disposition: A | Discharge status: Home / Self Care | Payer: Medicaid Other | Source: Ambulatory Visit | Attending: Plastic Surgery | Admitting: Plastic Surgery

## 2014-10-14 DIAGNOSIS — Z853 Personal history of malignant neoplasm of breast: Secondary | ICD-10-CM | POA: Insufficient documentation

## 2014-10-14 DIAGNOSIS — Z171 Estrogen receptor negative status [ER-]: Secondary | ICD-10-CM | POA: Diagnosis not present

## 2014-10-14 DIAGNOSIS — Z803 Family history of malignant neoplasm of breast: Secondary | ICD-10-CM | POA: Insufficient documentation

## 2014-10-14 DIAGNOSIS — Z87891 Personal history of nicotine dependence: Secondary | ICD-10-CM | POA: Diagnosis not present

## 2014-10-14 DIAGNOSIS — Z9013 Acquired absence of bilateral breasts and nipples: Secondary | ICD-10-CM | POA: Diagnosis not present

## 2014-10-14 DIAGNOSIS — Z421 Encounter for breast reconstruction following mastectomy: Secondary | ICD-10-CM | POA: Insufficient documentation

## 2014-10-14 DIAGNOSIS — L905 Scar conditions and fibrosis of skin: Secondary | ICD-10-CM | POA: Diagnosis not present

## 2014-10-14 DIAGNOSIS — C773 Secondary and unspecified malignant neoplasm of axilla and upper limb lymph nodes: Secondary | ICD-10-CM | POA: Insufficient documentation

## 2014-10-14 DIAGNOSIS — Z800 Family history of malignant neoplasm of digestive organs: Secondary | ICD-10-CM | POA: Diagnosis not present

## 2014-10-14 DIAGNOSIS — N61 Inflammatory disorders of breast: Secondary | ICD-10-CM | POA: Diagnosis not present

## 2014-10-14 DIAGNOSIS — Z888 Allergy status to other drugs, medicaments and biological substances status: Secondary | ICD-10-CM | POA: Insufficient documentation

## 2014-10-14 HISTORY — PX: REMOVAL OF TISSUE EXPANDER AND PLACEMENT OF IMPLANT: SHX6457

## 2014-10-14 LAB — POCT HEMOGLOBIN-HEMACUE: Hemoglobin: 12.3 g/dL (ref 12.0–15.0)

## 2014-10-14 SURGERY — REMOVAL, TISSUE EXPANDER, BREAST, WITH IMPLANT INSERTION
Anesthesia: General | Site: Breast | Laterality: Bilateral

## 2014-10-14 MED ORDER — SCOPOLAMINE 1 MG/3DAYS TD PT72
MEDICATED_PATCH | TRANSDERMAL | Status: AC
  Filled 2014-10-14: qty 1

## 2014-10-14 MED ORDER — LIDOCAINE-EPINEPHRINE 1 %-1:100000 IJ SOLN
INTRAMUSCULAR | Status: AC
  Filled 2014-10-14: qty 1

## 2014-10-14 MED ORDER — BUPIVACAINE-EPINEPHRINE (PF) 0.25% -1:200000 IJ SOLN
INTRAMUSCULAR | Status: AC
  Filled 2014-10-14: qty 30

## 2014-10-14 MED ORDER — SODIUM CHLORIDE 0.9 % IR SOLN
Status: DC | PRN
  Administered 2014-10-14: 500 mL

## 2014-10-14 MED ORDER — LIDOCAINE HCL (CARDIAC) 20 MG/ML IV SOLN
INTRAVENOUS | Status: DC | PRN
  Administered 2014-10-14: 50 mg via INTRAVENOUS

## 2014-10-14 MED ORDER — OXYCODONE HCL 5 MG PO TABS
ORAL_TABLET | ORAL | Status: AC
  Filled 2014-10-14: qty 1

## 2014-10-14 MED ORDER — PROPOFOL 10 MG/ML IV BOLUS
INTRAVENOUS | Status: DC | PRN
Start: 2014-10-14 — End: 2014-10-14
  Administered 2014-10-14: 200 mg via INTRAVENOUS

## 2014-10-14 MED ORDER — MIDAZOLAM HCL 2 MG/2ML IJ SOLN
INTRAMUSCULAR | Status: AC
  Filled 2014-10-14: qty 2

## 2014-10-14 MED ORDER — EPINEPHRINE HCL 1 MG/ML IJ SOLN
INTRAMUSCULAR | Status: AC
  Filled 2014-10-14: qty 1

## 2014-10-14 MED ORDER — BUPIVACAINE-EPINEPHRINE 0.25% -1:200000 IJ SOLN
INTRAMUSCULAR | Status: DC | PRN
  Administered 2014-10-14: 7 mL

## 2014-10-14 MED ORDER — MIDAZOLAM HCL 2 MG/2ML IJ SOLN
1.0000 mg | INTRAMUSCULAR | Status: DC | PRN
Start: 2014-10-14 — End: 2014-10-14

## 2014-10-14 MED ORDER — HYDROMORPHONE HCL 1 MG/ML IJ SOLN
INTRAMUSCULAR | Status: AC
  Filled 2014-10-14: qty 1

## 2014-10-14 MED ORDER — CEFAZOLIN SODIUM-DEXTROSE 2-3 GM-% IV SOLR
INTRAVENOUS | Status: DC | PRN
  Administered 2014-10-14: 2 g via INTRAVENOUS

## 2014-10-14 MED ORDER — DEXAMETHASONE SODIUM PHOSPHATE 4 MG/ML IJ SOLN
INTRAMUSCULAR | Status: DC | PRN
  Administered 2014-10-14: 10 mg via INTRAVENOUS

## 2014-10-14 MED ORDER — SUCCINYLCHOLINE CHLORIDE 20 MG/ML IJ SOLN
INTRAMUSCULAR | Status: DC | PRN
  Administered 2014-10-14: 100 mg via INTRAVENOUS

## 2014-10-14 MED ORDER — FENTANYL CITRATE 0.05 MG/ML IJ SOLN: 50.0000 ug | INTRAMUSCULAR | Status: DC | PRN

## 2014-10-14 MED ORDER — PHENYLEPHRINE HCL 10 MG/ML IJ SOLN
INTRAMUSCULAR | Status: DC | PRN
  Administered 2014-10-14 (×2): 40 ug via INTRAVENOUS

## 2014-10-14 MED ORDER — LACTATED RINGERS IV SOLN
INTRAVENOUS | Status: DC
  Administered 2014-10-14: 12:00:00 via INTRAVENOUS

## 2014-10-14 MED ORDER — SUFENTANIL CITRATE 50 MCG/ML IV SOLN
INTRAVENOUS | Status: DC | PRN
  Administered 2014-10-14 (×2): 10 ug via INTRAVENOUS

## 2014-10-14 MED ORDER — LACTATED RINGERS IV SOLN
INTRAVENOUS | Status: DC
  Administered 2014-10-14 (×2): via INTRAVENOUS

## 2014-10-14 MED ORDER — OXYCODONE HCL 5 MG/5ML PO SOLN: 5.0000 mg | Freq: Once | ORAL | Status: AC | PRN

## 2014-10-14 MED ORDER — SUFENTANIL CITRATE 50 MCG/ML IV SOLN
INTRAVENOUS | Status: AC
  Filled 2014-10-14: qty 1

## 2014-10-14 MED ORDER — LIDOCAINE HCL (PF) 1 % IJ SOLN
INTRAMUSCULAR | Status: AC
  Filled 2014-10-14: qty 60

## 2014-10-14 MED ORDER — ONDANSETRON HCL 4 MG/2ML IJ SOLN
INTRAMUSCULAR | Status: DC | PRN
  Administered 2014-10-14: 4 mg via INTRAVENOUS

## 2014-10-14 MED ORDER — ONDANSETRON HCL 4 MG/2ML IJ SOLN: 4.0000 mg | Freq: Four times a day (QID) | INTRAMUSCULAR | Status: DC | PRN

## 2014-10-14 MED ORDER — SCOPOLAMINE 1 MG/3DAYS TD PT72
1.0000 | MEDICATED_PATCH | TRANSDERMAL | Status: DC
  Administered 2014-10-14: 1.5 mg via TRANSDERMAL

## 2014-10-14 MED ORDER — OXYCODONE HCL 5 MG PO TABS
5.0000 mg | ORAL_TABLET | Freq: Once | ORAL | Status: AC | PRN
  Administered 2014-10-14: 5 mg via ORAL

## 2014-10-14 MED ORDER — HYDROMORPHONE HCL 1 MG/ML IJ SOLN
0.2500 mg | INTRAMUSCULAR | Status: DC | PRN
  Administered 2014-10-14 (×4): 0.5 mg via INTRAVENOUS

## 2014-10-14 MED ORDER — MIDAZOLAM HCL 5 MG/5ML IJ SOLN
INTRAMUSCULAR | Status: DC | PRN
  Administered 2014-10-14: 2 mg via INTRAVENOUS

## 2014-10-14 SURGICAL SUPPLY — 75 items
BAG DECANTER FOR FLEXI CONT (MISCELLANEOUS) ×4 IMPLANT
BINDER BREAST LRG (GAUZE/BANDAGES/DRESSINGS) IMPLANT
BINDER BREAST MEDIUM (GAUZE/BANDAGES/DRESSINGS) IMPLANT
BINDER BREAST XLRG (GAUZE/BANDAGES/DRESSINGS) IMPLANT
BINDER BREAST XXLRG (GAUZE/BANDAGES/DRESSINGS) IMPLANT
BIOPATCH RED 1 DISK 7.0 (GAUZE/BANDAGES/DRESSINGS) IMPLANT
BIOPATCH RED 1IN DISK 7.0MM (GAUZE/BANDAGES/DRESSINGS)
BLADE HEX COATED 2.75 (ELECTRODE) ×4 IMPLANT
BLADE SURG 15 STRL LF DISP TIS (BLADE) ×6 IMPLANT
BLADE SURG 15 STRL SS (BLADE) ×6
BNDG GAUZE ELAST 4 BULKY (GAUZE/BANDAGES/DRESSINGS) ×8 IMPLANT
CANISTER LIPO FAT HARVEST (MISCELLANEOUS) ×4 IMPLANT
CANISTER SUCT 1200ML W/VALVE (MISCELLANEOUS) ×4 IMPLANT
CHLORAPREP W/TINT 26ML (MISCELLANEOUS) ×8 IMPLANT
COVER BACK TABLE 60X90IN (DRAPES) ×4 IMPLANT
COVER MAYO STAND STRL (DRAPES) ×4 IMPLANT
DECANTER SPIKE VIAL GLASS SM (MISCELLANEOUS) IMPLANT
DRAIN CHANNEL 19F RND (DRAIN) IMPLANT
DRAPE LAPAROSCOPIC ABDOMINAL (DRAPES) ×4 IMPLANT
DRSG PAD ABDOMINAL 8X10 ST (GAUZE/BANDAGES/DRESSINGS) ×8 IMPLANT
ELECT BLADE 4.0 EZ CLEAN MEGAD (MISCELLANEOUS) ×4
ELECT REM PT RETURN 9FT ADLT (ELECTROSURGICAL) ×4
ELECTRODE BLDE 4.0 EZ CLN MEGD (MISCELLANEOUS) ×2 IMPLANT
ELECTRODE REM PT RTRN 9FT ADLT (ELECTROSURGICAL) ×2 IMPLANT
EVACUATOR SILICONE 100CC (DRAIN) IMPLANT
FILTER LIPOSUCTION (MISCELLANEOUS) ×4 IMPLANT
GLOVE BIO SURGEON STRL SZ 6.5 (GLOVE) ×12 IMPLANT
GLOVE BIO SURGEONS STRL SZ 6.5 (GLOVE) ×4
GLOVE BIOGEL M 7.0 STRL (GLOVE) ×4 IMPLANT
GLOVE BIOGEL PI IND STRL 7.0 (GLOVE) ×4 IMPLANT
GLOVE BIOGEL PI IND STRL 7.5 (GLOVE) ×2 IMPLANT
GLOVE BIOGEL PI INDICATOR 7.0 (GLOVE) ×4
GLOVE BIOGEL PI INDICATOR 7.5 (GLOVE) ×2
GLOVE ECLIPSE 6.5 STRL STRAW (GLOVE) ×8 IMPLANT
GOWN STRL REUS W/ TWL LRG LVL3 (GOWN DISPOSABLE) ×10 IMPLANT
GOWN STRL REUS W/TWL LRG LVL3 (GOWN DISPOSABLE) ×10
IMPLANT BREAST MP GEL 390CC (Breast) ×8 IMPLANT
IV NS 1000ML (IV SOLUTION)
IV NS 1000ML BAXH (IV SOLUTION) IMPLANT
KIT FILL SYSTEM UNIVERSAL (SET/KITS/TRAYS/PACK) ×4 IMPLANT
LINER CANISTER 1000CC FLEX (MISCELLANEOUS) ×4 IMPLANT
LIQUID BAND (GAUZE/BANDAGES/DRESSINGS) ×8 IMPLANT
NDL SAFETY ECLIPSE 18X1.5 (NEEDLE) ×4 IMPLANT
NEEDLE HYPO 18GX1.5 SHARP (NEEDLE) ×4
NEEDLE HYPO 25X1 1.5 SAFETY (NEEDLE) ×4 IMPLANT
PACK BASIN DAY SURGERY FS (CUSTOM PROCEDURE TRAY) ×4 IMPLANT
PAD ALCOHOL SWAB (MISCELLANEOUS) ×4 IMPLANT
PENCIL BUTTON HOLSTER BLD 10FT (ELECTRODE) ×4 IMPLANT
PIN SAFETY STERILE (MISCELLANEOUS) IMPLANT
SIZER BREAST GENERIC MENTOR (SIZER) ×4 IMPLANT
SIZER BREAST REUSE 390CC (SIZER) ×4 IMPLANT
SLEEVE SCD COMPRESS KNEE MED (MISCELLANEOUS) ×4 IMPLANT
SPONGE GAUZE 4X4 12PLY STER LF (GAUZE/BANDAGES/DRESSINGS) IMPLANT
SPONGE LAP 18X18 X RAY DECT (DISPOSABLE) ×12 IMPLANT
SUT MNCRL AB 4-0 PS2 18 (SUTURE) ×4 IMPLANT
SUT MON AB 3-0 SH 27 (SUTURE) ×16
SUT MON AB 3-0 SH27 (SUTURE) ×16 IMPLANT
SUT MON AB 5-0 PS2 18 (SUTURE) ×16 IMPLANT
SUT PDS 3-0 CT2 (SUTURE)
SUT PDS AB 2-0 CT2 27 (SUTURE) IMPLANT
SUT PDS II 3-0 CT2 27 ABS (SUTURE) IMPLANT
SUT VIC AB 3-0 SH 27 (SUTURE)
SUT VIC AB 3-0 SH 27X BRD (SUTURE) IMPLANT
SUT VICRYL 4-0 PS2 18IN ABS (SUTURE) IMPLANT
SYR 20CC LL (SYRINGE) ×8 IMPLANT
SYR 50ML LL SCALE MARK (SYRINGE) ×12 IMPLANT
SYR BULB IRRIGATION 50ML (SYRINGE) ×4 IMPLANT
SYR CONTROL 10ML LL (SYRINGE) ×4 IMPLANT
SYR TB 1ML LL NO SAFETY (SYRINGE) IMPLANT
TOWEL OR 17X24 6PK STRL BLUE (TOWEL DISPOSABLE) ×8 IMPLANT
TUBE CONNECTING 20'X1/4 (TUBING) ×1
TUBE CONNECTING 20X1/4 (TUBING) ×3 IMPLANT
TUBING SET GRADUATE ASPIR 12FT (MISCELLANEOUS) ×4 IMPLANT
UNDERPAD 30X30 INCONTINENT (UNDERPADS AND DIAPERS) ×8 IMPLANT
YANKAUER SUCT BULB TIP NO VENT (SUCTIONS) ×4 IMPLANT

## 2014-10-14 NOTE — Anesthesia Postprocedure Evaluation (Signed)
Anesthesia Post Note  Patient: Tamara [name]  Procedure(s) Performed: Procedure(s) (LRB): REMOVAL OF BILATERAL BREAST  TISSUE EXPANDER  WITH PLASCEMENT OF BILATERAL  BREAST IMPLANTS (Bilateral)  Anesthesia type: General  Patient location: PACU  Post pain: Pain level controlled and Adequate analgesia  Post assessment: Post-op Vital signs reviewed, Patient's Cardiovascular Status Stable, Respiratory Function Stable, Patent Airway and Pain level controlled  Last Vitals:  Filed Vitals:   10/14/14 1245  BP: 152/83  Pulse: 61  Temp:   Resp: 20    Post vital signs: Reviewed and stable  Level of consciousness: awake, alert  and oriented  Complications: No apparent anesthesia complications

## 2014-10-14 NOTE — Transfer of Care (Signed)
Immediate Anesthesia Transfer of Care Note  Patient: Tamara [name]  Procedure(s) Performed: Procedure(s) (LRB): REMOVAL OF BILATERAL BREAST  TISSUE EXPANDER  WITH PLASCEMENT OF BILATERAL  BREAST IMPLANTS (Bilateral)  Patient Location: PACU  Anesthesia Type: General  Level of Consciousness: awake, sedated, patient cooperative and responds to stimulation  Airway & Oxygen Therapy: Patient Spontanous Breathing and Patient connected to face mask oxygen  Post-op Assessment: Report given to PACU RN, Post -op Vital signs reviewed and stable and Patient moving all extremities  Post vital signs: Reviewed and stable  Complications: No apparent anesthesia complications

## 2014-10-14 NOTE — Anesthesia Preprocedure Evaluation (Signed)
Anesthesia Evaluation  Patient identified by MRN, date of birth, ID band Patient awake    Reviewed: Allergy & Precautions, H&P , NPO status , Patient's Chart, lab work & pertinent test results  Airway Mallampati: II   Neck ROM: full    Dental   Pulmonary shortness of breath, former smoker,          Cardiovascular hypertension, DVT     Neuro/Psych    GI/Hepatic   Endo/Other  obese  Renal/GU      Musculoskeletal   Abdominal   Peds  Hematology   Anesthesia Other Findings   Reproductive/Obstetrics Breast CA                             Anesthesia Physical Anesthesia Plan  ASA: II  Anesthesia Plan: General   Post-op Pain Management:    Induction: Intravenous  Airway Management Planned: Oral ETT  Additional Equipment:   Intra-op Plan:   Post-operative Plan: Extubation in OR  Informed Consent: I have reviewed the patients History and Physical, chart, labs and discussed the procedure including the risks, benefits and alternatives for the proposed anesthesia with the patient or authorized representative who has indicated his/her understanding and acceptance.     Plan Discussed with: CRNA, Anesthesiologist and Surgeon  Anesthesia Plan Comments:         Anesthesia Quick Evaluation

## 2014-10-14 NOTE — Op Note (Signed)
Op report Bilateral Exchange   DATE OF OPERATION: 10/14/2014  LOCATION: [place]  SURGICAL DIVISION: Plastic Surgery  PREOPERATIVE DIAGNOSES:  1. History of breast cancer.  2. Acquired absence of bilateral breast.   POSTOPERATIVE DIAGNOSES:  1. History of breast cancer.  2. Acquired absence of bilateral breast.   PROCEDURE:  1. Bilateral exchange of tissue expanders for implants.  2. Bilateral capsulotomies for implant respositioning.  SURGEON: Theodoro Kos, DO  ASSISTANT: Shawn Rayburn, PA  ANESTHESIA:  General.   COMPLICATIONS: None.   IMPLANTS: Left - Mentor memory shape medium height high profile 390cc. Ref #[id number].  Serial Number [id number] Right - Mentor memory shape medium height high profile 390cc. Ref #[id number].  Serial Number [id number]  INDICATIONS FOR PROCEDURE:  The patient, Tamara [name], is a 43 y.o. female born on 1971/01/09, is here for treatment after bilateral mastectomies.  She had tissue expanders placed at the time of mastectomies. She now presents for exchange of her expanders for implants.  She requires capsulotomies to better position the implants. MRN: [id number]  CONSENT:  Informed consent was obtained directly from the patient. Risks, benefits and alternatives were fully discussed. Specific risks including but not limited to bleeding, infection, hematoma, seroma, scarring, pain, implant infection, implant extrusion, capsular contracture, asymmetry, wound healing problems, and need for further surgery were all discussed. The patient did have an ample opportunity to have her questions answered to her satisfaction.   DESCRIPTION OF PROCEDURE:  The patient was taken to the operating room. SCDs were placed and IV antibiotics were given. [name] chest was prepped and draped in a sterile fashion. A time out was performed and the implants to be used were identified.  One percent Lidocaine with epinephrine was used to  infiltrate the area.   The old mastectomy scar was opened and superior mastectomy and inferior mastectomy flaps were re-raised over the pectoralis major muscle. The pectoralis was split to expose the tissue expander which was removed. Inspection of the pocket showed a normal healthy capsule and good integration of the biologic matrix.   Bilateral and circumferential capsulotomies were performed on each breast to allow for breast pocket expansion on either side.  Measurements were made on either side to confirm adequate pocket size for the implant dimensions.  Hemostasis was ensured.  On either side gloves were changed. The implants were placed in the pockets and oriented appropriately. The pectoralis major muscle and  capsule on the anterior surface were re-closed with a 3-0 running Monocryl suture. The remaining skin was closed with 4-0 Monocryl deep dermal and 5-0 Monocryl subcuticular stitches.  A breast binder and ABDs were placed.  The patient was allowed to wake from anesthesia and taken to the recovery room in satisfactory condition.

## 2014-10-14 NOTE — Anesthesia Procedure Notes (Signed)
Procedure Name: Intubation Date/Time: 10/14/2014 9:41 AM Performed by: Melynda Ripple D Pre-anesthesia Checklist: Patient identified, Emergency Drugs available, Suction available and Patient being monitored Patient Re-evaluated:Patient Re-evaluated prior to inductionOxygen Delivery Method: Circle System Utilized Preoxygenation: Pre-oxygenation with 100% oxygen Intubation Type: IV induction Ventilation: Mask ventilation without difficulty Laryngoscope Size: Mac and 3 Grade View: Grade I Tube type: Oral Number of attempts: 1 Airway Equipment and Method: stylet and oral airway Placement Confirmation: ETT inserted through vocal cords under direct vision,  positive ETCO2 and breath sounds checked- equal and bilateral Secured at: 22 cm Tube secured with: Tape Dental Injury: Teeth and Oropharynx as per pre-operative assessment

## 2014-10-14 NOTE — H&P (Signed)
[name]            Primary Visit Coverage     Payor Plan Sponsor Code Group Number Group Name   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   Z90.13     Reason for Visit    Surgical Consent         All Charges for This Encounter    Code Description Service Date Service Provider Modifiers Otho Darner   [phone] PR POST-OP FOLLOW-UP VISIT 10/07/2014 Theodoro Kos, DO  1    Dx: Acquired absence of bilateral breasts and nipples [Z90.13]    All Notes    Progress Notes by Theodoro Kos, DO at 10/07/2014 10:43 AM    Author: Theodoro Kos, DO Service: (none) Author Type: Physician   Filed: 10/07/2014 11:21 AM Note Time: 10/07/2014 10:43 AM Status: Signed   Editor: Theodoro Kos, DO (Physician)     Expand All Collapse All     ICD-10-CM    1.  Acquired absence of bilateral breasts and nipples  Z90.13    History of Present Illness: Contina Strain is a 43 y.o. female with a history of breast cancer. She presents for a history and physical for surgery. The patient is schedule to undergo secondary breast reconstruction. She noted a lump in her left lateral breast and underwent a mammogram which was positive for breast cancer. There was concern for positive lymph nodes as well. 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 She is 5 feet 5 inches tall, weighs 190 pounds and wears a 36 B. She would like to be around the same size or a C cup. She had a c-section and breast feed her child. The BRCA-1 was positive and she has a strong family history for colon cancer and breast cancer. She is currently staged at II (T2 N1). She is strongly considering bilateral  mastectomies with reconstruction followed by salpingo-oophorectomies. At this time she would like to move ahead with left mastectomy and reconstruction. She completed chemotherapy at the end of July. She did develop an infection in her port site and had to have that removed. She has a PICC line and completed her treatments through this access. The patient currently has 440 cc in each expander.  The following portions of the patient's history were reviewed and updated as appropriate: allergies, current medications, past family history, past medical history, past social history, past surgical history and problem list.  Past Medical History: Allergies: Allergies   Allergen  Reactions   .  Compazine [Prochlorperazine]  Other (See Comments)     Makes her stutter    Current Medications: Current outpatient prescriptions: carvedilol (COREG) 6.25 MG tablet, Take 6.25 mg by mouth., Disp: , Rfl: ; cloNIDine HCl (CATAPRES) 0.2 MG tablet, Take 0.2 mg by mouth., Disp: , Rfl: ; cyclobenzaprine (FLEXERIL) 10 MG tablet, Take 10 mg by mouth 3 (three) times daily as needed., Disp: , Rfl:  dexamethasone (DECADRON) 4 MG tablet, Take 8 mg by mouth 2 times daily with meals. Take 2 tablets (8 mg total) 2 times daily. Start the day before Taxotere. Then again the  day after chemo for 3 days., Disp: , Rfl: ; diazepam (VALIUM) 2 MG tablet, Take 1 tablet (2 mg total) by mouth every 6 (six) hours as needed., Disp: 30 tablet, Rfl: 0; diphenhydrAMINE (BENADRYL) 25 mg capsule, Take by mouth., Disp: , Rfl:  docusate sodium (DSS) 100 MG capsule, Take 100 mg by mouth daily., Disp: , Rfl: ; enoxaparin (LOVENOX) 100 mg/mL Syrg injection *ANTICOAGULANT*, Inject into the skin., Disp: , Rfl: ; ferrous sulfate (FERATAB) 325 (65 FE) MG tablet, Take by mouth., Disp: , Rfl: ; hydrALAZINE (APRESOLINE) 50 MG tablet, Take 50 mg by mouth every 8 hours., Disp: , Rfl:  ibuprofen (ADVIL,MOTRIN) 800 MG tablet, Take 1 tablet (800 mg total) by mouth every  6 (six) hours as needed for Pain., Disp: 60 tablet, Rfl: 2; ipratropium (ATROVENT) 0.03 % nasal spray, , Disp: , Rfl: 0; lidocaine-prilocaine (EMLA) cream, Apply 30 g topically as needed., Disp: , Rfl: ; lisinopril (PRINIVIL,ZESTRIL) 40 MG tablet, Take 40 mg by mouth daily., Disp: , Rfl:  LORazepam (ATIVAN) 0.5 MG tablet, Take 0.5 mg by mouth every 6 (six) hours as needed for Anxiety., Disp: , Rfl: ; MUCUS RELIEF ER 600 mg 12 hr tablet, 2 tablets 2 times daily., Disp: , Rfl: 0; ondansetron (ZOFRAN) 8 MG tablet, Take 8 mg by mouth 2 times daily. Start the day after chemo for 3 days. Then take PRN., Disp: , Rfl:  oxyCODONE (ROXICODONE) 5 MG immediate release tablet, Take 1 tablet (5 mg total) by mouth every 4 (four) hours as needed for Pain., Disp: 60 tablet, Rfl: 0; oxyCODONE-acetaminophen (ROXICET) 5-325 mg per tablet, Take 1 tablet by mouth every 4 (four) hours as needed for Pain., Disp: 60 tablet, Rfl: 0; traMADol (ULTRAM) 50 mg tablet, Take by mouth., Disp: , Rfl:   Past Medical Problems:  Past Medical History   Past Medical History   Diagnosis  Date   .  Anemia    .  Breast disorder    .  Cancer [facility])       Past Surgical History:  Past Surgical History   Past Surgical History   Procedure  Laterality  Date   .  Cesarean section  N/A  1993   .  Uterine polypectomy  N/A  2010       The patient has had anesthesia or sedation in the past.  The patient has not had problems with anesthesia.   Social History:  Social History   History   Social History   .  Marital Status:  Divorced     Spouse Name:  N/A     Number of Children:  N/A   .  Years of Education:  N/A   Occupational History   .  Not on file.   Social History Main Topics   .  Smoking status:  Former Smoker -- 0.20 packs/day     Types:  Cigarettes     Start date:  03/09/1994     Quit date:  03/09/2009   .  Smokeless tobacco:  Never Used   .  Alcohol Use:  No   .  Drug Use:  No   .  Sexual Activity:  No   Other Topics   Concern   .  Not on file   Social History Narrative      Family History:  Family History   Family History   Problem  Relation  Age of Onset   .  Cancer  Mother    .  Hypertension  Mother    .  Cancer  Father    .  Diabetes  Father    .  No Known Problems  Brother       Review of Systems: General ROS: negative Dermatological ROS: negative Cardiovascular ROS: no chest pain or dyspnea on exertion ENT ROS: negative Gastrointestinal ROS: no abdominal pain, change in bowel habits, or black or bloody stools  Physical Exam: Vital Signs BP 188/97 mmHg  Pulse 88  Resp 18  Ht 1.651 m (5' 5" )  Wt 90.538 kg (199 lb 9.6 oz)  BMI 33.22 kg/m2  SpO2 100%  Breastfeeding? No General:alert, appears stated age and cooperative HEENT:negative Neck: normal Chest: Normal. Breast: normal appearance, no masses or tenderness Cardiac: Regular rate and rhythm Abdomen: soft, non-tender; bowel sounds normal; no masses, no organomegaly GU: normal Musculoskeletal: Normal. Neuro: oriented, normal mood Extremities: normal strength, tone, and muscle mass, no deformities Skin: no rashes, no ecchymoses, no petechiae  Assessment: 43 y.o.female with a history of breast cancer and acquired absence of bilateral breasts.  Plan: The patient is scheduled for Remove bilateral breast tissue expanders with placement of bilateral implants & liposuction with fat grafting for lipo-filling to bilateral breasts. Risks, benefits, and alternatives of procedure discussed, questions answered and consent obtained.    Electronically signed by: Theodoro Kos, DO 10/07/2014 10:43 AM           Vital Signs - Last Recorded    BP Pulse Resp Ht Wt BMI   188/97 mmHg 88 18 1.651 m (5' 5" ) 90.538 kg (199 lb 9.6 oz) 33.22 kg/m2    SpO2 Breastfeeding?           100% No      Medications Ordered This Encounter      Disp Refills Start End   cephalexin (KEFLEX) 500 MG capsule 28 capsule 0 10/07/2014    Take 1 capsule (500  mg total) by mouth 4 times daily. - Oral   ondansetron (ZOFRAN, AS HYDROCHLORIDE,) 4 MG tablet 5 tablet 0 10/07/2014 10/14/2014   Take 1 tablet (4 mg total) by mouth every 8 (eight) hours as needed for up to 7 days for Nausea / Vomiting. - Oral   HYDROcodone-acetaminophen (NORCO) 5-325 mg per tablet 30 tablet 0 10/07/2014    Take 1 tablet by mouth every 6 (six) hours as needed for Pain. - Oral   diazepam (VALIUM) 2 MG tablet 30 tablet 0 10/07/2014 10/17/2014   Take 1 tablet (2 mg total) by mouth every 6 (six) hours as needed for up to 10 days for Anxiety. - Oral    Discontinued Medications      Reason for Discontinue   ondansetron (ZOFRAN) 8 MG tablet    diazepam (VALIUM) 2 MG tablet    oxyCODONE (ROXICODONE) 5 MG immediate release tablet No Longer Needed   traMADol (ULTRAM) 50 mg tablet No Longer Needed   oxyCODONE-acetaminophen (ROXICET) 5-325 mg per tablet No Longer Needed   LORazepam (ATIVAN) 0.5 MG tablet No Longer Needed

## 2014-10-14 NOTE — Discharge Instructions (Addendum)
May shower Saturday  Call your surgeon if you experience:   1.  Fever over 101.0. 2.  Inability to urinate. 3.  Nausea and/or vomiting. 4.  Extreme swelling or bruising at the surgical site. 5.  Continued bleeding from the incision. 6.  Increased pain, redness or drainage from the incision. 7.  Problems related to your pain medication. 8. Any change in color, movement and/or sensation 9. Any problems and/or concerns  Post Anesthesia Home Care Instructions  Activity: Get plenty of rest for the remainder of the day. A responsible adult should stay with you for 24 hours following the procedure.  For the next 24 hours, DO NOT: -Drive a car -Paediatric nurse -Drink alcoholic beverages -Take any medication unless instructed by your physician -Make any legal decisions or sign important papers.  Meals: Start with liquid foods such as gelatin or soup. Progress to regular foods as tolerated. Avoid greasy, spicy, heavy foods. If nausea and/or vomiting occur, drink only clear liquids until the nausea and/or vomiting subsides. Call your physician if vomiting continues.  Special Instructions/Symptoms: Your throat may feel dry or sore from the anesthesia or the breathing tube placed in your throat during surgery. If this causes discomfort, gargle with warm salt water. The discomfort should disappear within 24 hours.

## 2014-10-14 NOTE — Brief Op Note (Signed)
10/14/2014  11:45 AM  PATIENT:  Tamara [name]  43 y.o. female  PRE-OPERATIVE DIAGNOSIS:  BREAST CANCER AQUIRED ABSENCE BILATERAL NIPPLES AND BREAST   POST-OPERATIVE DIAGNOSIS:  BREAST CANCER AQUIRED ABSENCE BILATERAL NIPPLES AND BREAST   PROCEDURE:  Procedure(s): REMOVAL OF BILATERAL BREAST  TISSUE EXPANDER  WITH PLASCEMENT OF BILATERAL  BREAST IMPLANTS (Bilateral)  SURGEON:  Surgeon(s) and Role:    * Claire Sanger, DO - Primary  PHYSICIAN ASSISTANT: Shawn Rayburn, PA  ASSISTANTS: none   ANESTHESIA:   general  EBL:  Total I/O In: 1000 [I.V.:1000] Out: -   BLOOD ADMINISTERED:none  DRAINS: none   LOCAL MEDICATIONS USED:  MARCAINE     SPECIMEN:  Source of Specimen:  bilateral mastectomy scars  DISPOSITION OF SPECIMEN:  PATHOLOGY  COUNTS:  YES  TOURNIQUET:  * No tourniquets in log *  DICTATION: .Dragon Dictation  PLAN OF CARE: Discharge to home after PACU  PATIENT DISPOSITION:  PACU - hemodynamically stable.   Delay start of Pharmacological VTE agent (>24hrs) due to surgical blood loss or risk of bleeding: no

## 2014-10-15 ENCOUNTER — Encounter (HOSPITAL_BASED_OUTPATIENT_CLINIC_OR_DEPARTMENT_OTHER): Payer: Self-pay | Admitting: Plastic Surgery

## 2014-10-18 ENCOUNTER — Telehealth: Payer: Self-pay | Admitting: *Deleted

## 2014-10-18 ENCOUNTER — Other Ambulatory Visit: Payer: Self-pay | Admitting: Oncology

## 2014-10-18 NOTE — Telephone Encounter (Signed)
Per staff message and POF I have scheduled appts. Advised scheduler of appts. JMW  

## 2014-10-19 ENCOUNTER — Telehealth: Payer: Self-pay | Admitting: Oncology

## 2014-10-19 NOTE — Telephone Encounter (Signed)
per pof to sch pt appt-cld pt adv of appt-mailed copy of appts

## 2014-10-20 ENCOUNTER — Ambulatory Visit (HOSPITAL_BASED_OUTPATIENT_CLINIC_OR_DEPARTMENT_OTHER)
Admission: RE | Admit: 2014-10-20 | Discharge: 2014-10-20 | Disposition: A | Discharge status: Home / Self Care | Payer: Medicaid Other | Source: Ambulatory Visit | Attending: Internal Medicine | Admitting: Internal Medicine

## 2014-10-20 ENCOUNTER — Ambulatory Visit (HOSPITAL_COMMUNITY)
Admission: RE | Admit: 2014-10-20 | Discharge: 2014-10-20 | Disposition: A | Discharge status: Home / Self Care | Payer: Medicaid Other | Source: Ambulatory Visit | Attending: Internal Medicine | Admitting: Internal Medicine

## 2014-10-20 VITALS — BP 151/92 | HR 73 | Wt 204.8 lb

## 2014-10-20 DIAGNOSIS — C779 Secondary and unspecified malignant neoplasm of lymph node, unspecified: Secondary | ICD-10-CM | POA: Insufficient documentation

## 2014-10-20 DIAGNOSIS — Z79899 Other long term (current) drug therapy: Secondary | ICD-10-CM | POA: Diagnosis not present

## 2014-10-20 DIAGNOSIS — I429 Cardiomyopathy, unspecified: Secondary | ICD-10-CM | POA: Diagnosis not present

## 2014-10-20 DIAGNOSIS — I427 Cardiomyopathy due to drug and external agent: Secondary | ICD-10-CM | POA: Insufficient documentation

## 2014-10-20 DIAGNOSIS — D509 Iron deficiency anemia, unspecified: Secondary | ICD-10-CM | POA: Insufficient documentation

## 2014-10-20 DIAGNOSIS — T451X5S Adverse effect of antineoplastic and immunosuppressive drugs, sequela: Secondary | ICD-10-CM | POA: Insufficient documentation

## 2014-10-20 DIAGNOSIS — I255 Ischemic cardiomyopathy: Secondary | ICD-10-CM

## 2014-10-20 DIAGNOSIS — C50412 Malignant neoplasm of upper-outer quadrant of left female breast: Secondary | ICD-10-CM | POA: Diagnosis not present

## 2014-10-20 DIAGNOSIS — Z803 Family history of malignant neoplasm of breast: Secondary | ICD-10-CM | POA: Insufficient documentation

## 2014-10-20 DIAGNOSIS — I313 Pericardial effusion (noninflammatory): Secondary | ICD-10-CM | POA: Diagnosis not present

## 2014-10-20 DIAGNOSIS — Z86718 Personal history of other venous thrombosis and embolism: Secondary | ICD-10-CM | POA: Diagnosis not present

## 2014-10-20 DIAGNOSIS — I10 Essential (primary) hypertension: Secondary | ICD-10-CM

## 2014-10-20 DIAGNOSIS — Z9013 Acquired absence of bilateral breasts and nipples: Secondary | ICD-10-CM | POA: Insufficient documentation

## 2014-10-20 DIAGNOSIS — C50912 Malignant neoplasm of unspecified site of left female breast: Secondary | ICD-10-CM | POA: Diagnosis not present

## 2014-10-20 DIAGNOSIS — C50419 Malignant neoplasm of upper-outer quadrant of unspecified female breast: Secondary | ICD-10-CM

## 2014-10-20 MED ORDER — CARVEDILOL 12.5 MG PO TABS: 12.5000 mg | ORAL_TABLET | Freq: Two times a day (BID) | ORAL | Status: DC

## 2014-10-20 MED ORDER — LISINOPRIL 40 MG PO TABS: 40.0000 mg | ORAL_TABLET | Freq: Every day | ORAL | Status: DC

## 2014-10-20 NOTE — Patient Instructions (Signed)
Increase Carvedilol to 12.5 mg Twice daily   Your physician has requested that you have a cardiac MRI. Cardiac MRI uses a computer to create images of your heart as its beating, producing both still and moving pictures of your heart and major blood vessels. For further information please visit [email or web address] Please follow the instruction sheet given to you today for more information.  IN 2 MONTHS, WE WILL CALL YOU TO SCHEDULE ONCE YOUR INSURANCE APPROVES.  We will contact you in 2 months to schedule your next appointment.

## 2014-10-20 NOTE — Progress Notes (Signed)
*  PRELIMINARY RESULTS* Echocardiogram 2D Echocardiogram has been performed.  Leavy Cella 10/20/2014, 10:09 AM

## 2014-10-20 NOTE — Progress Notes (Signed)
Patient ID: Tamara [name], female   DOB: 1971/03/10, 43 y.o.   MRN: [id number] Referring Physician: Dr. Jana Hakim Primary Care: [place] Primary Cardiologist: N/A Plastic Surgeon: Dr Migdalia Dk  HPI: Tamara [name] is a 43 yo with a history of HTN and chronic anemia. She was diagnosed with L breast cancer in 3/15. 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 Lymph node was positive for metastatic disease.She is S/P Bilateral Mastectomy with reconstruction 09/02/2014.   Was treated with Taxotere and carboplatinum + Herceptin/perjeta x 6 cycles - stopped in 7/15 due to low EF.   Admitted with MSSA bacteremia due to port infection. Completed IV abx. TEE in 6/15 with EF 50-55%. 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   She is currently off Herceptin because of cardiomyopathy.  This does seem to have pre-dated Herceptin use.  She had LHC to rule out CAD in 10/15.  This showed normal coronaries and EF 55%. She reports chronic exertional dyspnea after walking 100 yards or climbing a flight of steps.    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 + inferior lateral wall aneurysm. Small pericardial effusion  ECHO 11/15: EF 55%, basal anterolateral hypokinesis (basal inferolateral wall not well-visualized), lateral S' 13.6, images not adequate to assess strain.   SH: Does not smoke or drink  FH: Mother diagnosed breast CA at age 40, living        2 paternal aunts - breast cancer (1 deceased and 1 living)        Father- Deceased at age 19 of cancer not  sure what kind        - Has 41 yo, 41 yo, 41 yo, 41 yo and 41 yo (3 boys and 2 girls)  ROS: All systems reviewed and negative except as per HPI.    Past Medical History  Diagnosis Date  . Endometriosis   . Hypertension   . Rash     fine rash on abd  . Anemia   . Wears glasses   . 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 Iron deficiency anemia, unspecified 02/25/2014  . BRCA1 positive     c.190T>G (p.Cys64Gly) @ [facility]   . Shortness of breath      when Hemoglobin low  . History of blood transfusion   . DVT (deep venous thrombosis)     Right Subclavian and IJ.    Current Outpatient Prescriptions  Medication Sig Dispense Refill  . acetaminophen (TYLENOL) 325 MG tablet Take 2 tablets (650 mg total) by mouth every 6 (six) hours as needed for mild pain (or Temp > 100).    . carvedilol (COREG) 12.5 MG tablet Take 1 tablet (12.5 mg total) by mouth 2 (two) times daily with a meal. 60 tablet 3  . cyclobenzaprine (FLEXERIL) 10 MG tablet Take 1 tablet (10 mg total) by mouth 3 (three) times daily as needed for muscle spasms. 30 tablet 0  . diazepam (VALIUM) 2 MG tablet Take 1 tablet (2 mg total) by mouth every  6 (six) hours as needed for anxiety or muscle spasms. 30 tablet 0  . diphenhydrAMINE (BENADRYL) 25 mg capsule Take 25 mg by mouth every 6 (six) hours as needed for itching.    . docusate sodium (COLACE) 100 MG capsule Take 100 mg by mouth 2 (two) times daily as needed for mild constipation.    . enoxaparin (LOVENOX) 100 MG/ML injection Inject 0.95 mLs (95 mg total) into the skin every 12 (twelve) hours. 60 Syringe 0  . ferrous sulfate 325 (65 FE) MG tablet Take 325 mg by mouth 3 (three) times daily with meals.    . hydrALAZINE (APRESOLINE) 50 MG tablet Take 1 tablet (50 mg total) by mouth every 8 (eight) hours. 90 tablet 6  . HYDROcodone-homatropine (HYCODAN) 5-1.5 MG/5ML syrup Take 5 mLs by mouth every 6 (six) hours as needed for cough. 120 mL 0  . ibuprofen (ADVIL,MOTRIN) 600 MG  tablet Take 600 mg by mouth every 6 (six) hours as needed for mild pain.    Marland Kitchen ipratropium (ATROVENT) 0.03 % nasal spray Place 2 sprays into the nose 2 (two) times daily. PRN congestion 30 mL 0  . lidocaine-prilocaine (EMLA) cream Apply 1 application topically as needed (For port-a-cath.). 30 g 1  . lisinopril (PRINIVIL,ZESTRIL) 40 MG tablet Take 1 tablet (40 mg total) by mouth daily. 30 tablet 6  . loratadine (CLARITIN) 10 MG tablet Take 10 mg by mouth daily as needed (For inflammation after receiving her injections during chemo.).     Marland Kitchen LORazepam (ATIVAN) 0.5 MG tablet Take 1 tablet (0.5 mg total) by mouth every 6 (six) hours as needed (Nausea or vomiting). 30 tablet 0  . ondansetron (ZOFRAN) 8 MG tablet Take 8 mg by mouth 2 (two) times daily as needed for nausea or vomiting (taken on chemo days).     Marland Kitchen oxyCODONE (OXY IR/ROXICODONE) 5 MG immediate release tablet Take 1 tablet (5 mg total) by mouth every 4 (four) hours as needed for severe pain. 30 tablet 0  . traMADol (ULTRAM) 50 MG tablet Take 50 mg by mouth every 12 (twelve) hours as needed for moderate pain.     No current facility-administered medications for this encounter.    Allergies  Allergen Reactions  . Compazine [Prochlorperazine Edisylate] Other (See Comments)    Stuttering     Filed Vitals:   10/20/14 1008  BP: 151/92  Pulse: 73  Weight: 204 lb 12 oz (92.874 kg)  SpO2: 97%   PHYSICAL EXAM: General:  Well appearing. No respiratory difficulty HEENT: normal Neck: supple. no JVD. Carotids 2+ bilat; no bruits. No lymphadenopathy or thryomegaly appreciated. Cor: PMI nondisplaced. Regular rate & rhythm. No rubs, gallops or murmurs. Lungs: clear Abdomen: soft, nontender, nondistended. No hepatosplenomegaly. No bruits or masses. Good bowel sounds. Extremities: no cyanosis, clubbing, rash, edema PICC in LUE Neuro: alert & oriented x 3, cranial nerves grossly intact. moves all 4 extremities w/o difficulty. Affect  pleasant.  ASSESSMENT & PLAN:  1) Cardiomyopathy:  This seems to have pre-dated Herceptin use.  I reviewed today's echo.  It was difficult given breast implants.  EF appeared somewhat improved at 55%.  There was basal anterolateral hypokinesis but the basal inferolateral wall was poorly visualized. Lateral S' was better than in the past but images were not adequate for strain. LHC showed no significant coronary disease.   - Increase Coreg to 12.5 mg bid. - Continue lisinopril. - I think it would be reasonable to restart Herceptin.  I will arrange for cardiac  MRI in 2 months as windows for echo are poor with breast implants.  The MRI will also allow [place] to assess for infiltrative disease.  The location of the wall motion abnormalities in the lateral wall could be consistent with cardiac sarcoidosis.  2) Breast Cancer L:  HER-2/neu positive with a proliferation marker Ki-67 79%.  She has been off Herceptin, but as above, I think it would be reasonable to restart.  4) HTN BP high.  Increasing Coreg as above.  5) DVT Continue Lovenox 6) Pericardial effusion  No significant effusion on today's echo.   Loralie Champagne 10/20/2014

## 2014-11-05 ENCOUNTER — Other Ambulatory Visit: Payer: Self-pay | Admitting: *Deleted

## 2014-11-08 ENCOUNTER — Other Ambulatory Visit (HOSPITAL_COMMUNITY): Payer: Medicaid Other

## 2014-11-09 ENCOUNTER — Ambulatory Visit (HOSPITAL_BASED_OUTPATIENT_CLINIC_OR_DEPARTMENT_OTHER): Payer: Medicaid Other | Admitting: Nurse Practitioner

## 2014-11-09 ENCOUNTER — Ambulatory Visit (HOSPITAL_BASED_OUTPATIENT_CLINIC_OR_DEPARTMENT_OTHER): Payer: Medicaid Other

## 2014-11-09 ENCOUNTER — Other Ambulatory Visit: Payer: Self-pay | Admitting: Oncology

## 2014-11-09 ENCOUNTER — Encounter: Payer: Self-pay | Admitting: Nurse Practitioner

## 2014-11-09 ENCOUNTER — Other Ambulatory Visit: Payer: Self-pay | Admitting: *Deleted

## 2014-11-09 ENCOUNTER — Other Ambulatory Visit (HOSPITAL_BASED_OUTPATIENT_CLINIC_OR_DEPARTMENT_OTHER): Payer: Medicaid Other

## 2014-11-09 DIAGNOSIS — C50412 Malignant neoplasm of upper-outer quadrant of left female breast: Secondary | ICD-10-CM

## 2014-11-09 DIAGNOSIS — Z5112 Encounter for antineoplastic immunotherapy: Secondary | ICD-10-CM

## 2014-11-09 DIAGNOSIS — I10 Essential (primary) hypertension: Secondary | ICD-10-CM

## 2014-11-09 DIAGNOSIS — I429 Cardiomyopathy, unspecified: Secondary | ICD-10-CM

## 2014-11-09 DIAGNOSIS — Z171 Estrogen receptor negative status [ER-]: Secondary | ICD-10-CM

## 2014-11-09 LAB — CBC WITH DIFFERENTIAL/PLATELET
BASO%: 0.1 % (ref 0.0–2.0)
Basophils Absolute: 0 10*3/uL (ref 0.0–0.1)
EOS%: 5.8 % (ref 0.0–7.0)
Eosinophils Absolute: 0.5 10*3/uL (ref 0.0–0.5)
HEMATOCRIT: 36.5 % (ref 34.8–46.6)
HGB: 11.6 g/dL (ref 11.6–15.9)
LYMPH#: 2.4 10*3/uL (ref 0.9–3.3)
LYMPH%: 31 % (ref 14.0–49.7)
MCH: 28 pg (ref 25.1–34.0)
MCHC: 31.8 g/dL (ref 31.5–36.0)
MCV: 88 fL (ref 79.5–101.0)
MONO#: 0.5 10*3/uL (ref 0.1–0.9)
MONO%: 6.6 % (ref 0.0–14.0)
NEUT#: 4.4 10*3/uL (ref 1.5–6.5)
NEUT%: 56.5 % (ref 38.4–76.8)
Platelets: 289 10*3/uL (ref 145–400)
RBC: 4.15 10*6/uL (ref 3.70–5.45)
RDW: 15.5 % — ABNORMAL HIGH (ref 11.2–14.5)
WBC: 7.7 10*3/uL (ref 3.9–10.3)

## 2014-11-09 LAB — COMPREHENSIVE METABOLIC PANEL (CC13)
ALT: 17 U/L (ref 0–55)
AST: 14 U/L (ref 5–34)
Albumin: 3.8 g/dL (ref 3.5–5.0)
Alkaline Phosphatase: 86 U/L (ref 40–150)
Anion Gap: 11 mEq/L (ref 3–11)
BUN: 13.6 mg/dL (ref 7.0–26.0)
CHLORIDE: 108 meq/L (ref 98–109)
CO2: 22 mEq/L (ref 22–29)
CREATININE: 1 mg/dL (ref 0.6–1.1)
Calcium: 9.3 mg/dL (ref 8.4–10.4)
EGFR: 84 mL/min/{1.73_m2} — ABNORMAL LOW (ref >90)
Glucose: 89 mg/dl (ref 70–140)
Potassium: 3.8 mEq/L (ref 3.5–5.1)
Sodium: 141 mEq/L (ref 136–145)
Total Protein: 6.8 g/dL (ref 6.4–8.3)

## 2014-11-09 MED ORDER — ACETAMINOPHEN 325 MG PO TABS
650.0000 mg | ORAL_TABLET | Freq: Once | ORAL | Status: AC
  Administered 2014-11-09: 650 mg via ORAL

## 2014-11-09 MED ORDER — DIPHENHYDRAMINE HCL 25 MG PO CAPS
ORAL_CAPSULE | ORAL | Status: AC
  Filled 2014-11-09: qty 1

## 2014-11-09 MED ORDER — SODIUM CHLORIDE 0.9 % IV SOLN
Freq: Once | INTRAVENOUS | Status: AC
  Administered 2014-11-09: 14:00:00 via INTRAVENOUS

## 2014-11-09 MED ORDER — DIPHENHYDRAMINE HCL 25 MG PO CAPS
25.0000 mg | ORAL_CAPSULE | Freq: Once | ORAL | Status: AC
  Administered 2014-11-09: 25 mg via ORAL

## 2014-11-09 MED ORDER — CLONIDINE HCL 0.1 MG PO TABS
ORAL_TABLET | ORAL | Status: AC
  Filled 2014-11-09: qty 1

## 2014-11-09 MED ORDER — ACETAMINOPHEN 325 MG PO TABS
ORAL_TABLET | ORAL | Status: AC
Start: 2014-11-09 — End: 2014-11-09
  Filled 2014-11-09: qty 2

## 2014-11-09 MED ORDER — TRASTUZUMAB CHEMO INJECTION 440 MG
6.0000 mg/kg | Freq: Once | INTRAVENOUS | Status: AC
  Administered 2014-11-09: 567 mg via INTRAVENOUS
  Filled 2014-11-09: qty 27

## 2014-11-09 MED ORDER — CLONIDINE HCL 0.1 MG PO TABS
0.1000 mg | ORAL_TABLET | Freq: Once | ORAL | Status: AC
  Administered 2014-11-09: 0.1 mg via ORAL

## 2014-11-09 NOTE — Patient Instructions (Signed)
[facility] Discharge Instructions for Patients Receiving Chemotherapy  Today you received the following chemotherapy agents Herceptin.  To help prevent nausea and vomiting after your treatment, we encourage you to take your nausea medication as prescribed.   If you develop nausea and vomiting that is not controlled by your nausea medication, call the clinic.   BELOW ARE SYMPTOMS THAT SHOULD BE REPORTED IMMEDIATELY:  *FEVER GREATER THAN 100.5 F  *CHILLS WITH OR WITHOUT FEVER  NAUSEA AND VOMITING THAT IS NOT CONTROLLED WITH YOUR NAUSEA MEDICATION  *UNUSUAL SHORTNESS OF BREATH  *UNUSUAL BRUISING OR BLEEDING  TENDERNESS IN MOUTH AND THROAT WITH OR WITHOUT PRESENCE OF ULCERS  *URINARY PROBLEMS  *BOWEL PROBLEMS  UNUSUAL RASH Items with * indicate a potential emergency and should be followed up as soon as possible.  Feel free to call the clinic you have any questions or concerns. The clinic phone number is [phone].    

## 2014-11-09 NOTE — Progress Notes (Signed)
ID: Tamara [name] OB: 04/22/1971  MR#: [id number]  CSN#:[id number]  PCP: Angelica Chessman, MD GYN:   SU: Dr. Erroll Luna OTHER MD: Dr. 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 There was also an 8 mm mass 8 cm from the nipple in the ipsilateral breast, as well as positive lymph nodes. 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 MRI confirmed 2.8 and 1.3 cm mass is as well as lymph node. On the right a 6 mm nodule, biopsied and negative for malignancy. PET scan 07-12-16 had hypermetabolic left breast mass consistent with known neoplasm and FDG positive left axillary lymph nodes,benign-appearing brown fat activity and muscular activity in the  neck and chest but no findings for metastatic disease involving the neck, chest, abdomen, pelvis or bones. Moderate FDG activity in the endometrial canal is thought likely due to secretory phase of ovulation or menses. No mass, uterine fibroids present. CT [facility] 02-19-14 had 3 cm left breast mass, enlarged left axillary lymph nodes are positive and no CT findings for metastatic disease involving the chest,abdomen or pelvis and no evidence of osseous metastatic disease. Mildly enlarged fibroid uterus."   Her subsequent treatment is as detailed below   INTERVAL HISTORY:   Tamara [name] returns today for follow up of her breast  cancer. She was seen in infusion for this appointment. She is restarting trastuzuamb this week with Dr. Claris Gladden clearance. The interval history is unremarkable. She has been married for awhile but has legally changed her name to Tamara [name]. She had her expanders removed and implants placed on 10/14/14.   REVIEW OF SYSTEMS: Tamara [name] denies fevers, chills, nausea, vomiting, or changes in bowel or bladder habits. She has a good appetite and is staying well hydrated. She has some hot flashes. She denies mouth sores, rashes, or peripheral neuropathy symptoms. She has some shortness of breath with exertion but denies chest pain, cough, palpitations, or fatigue. Her hair has grown back in and is actually gray. A detailed review of systems is otherwise negative.   PAST MEDICAL HISTORY: Past Medical History  Diagnosis Date  . Endometriosis   . Hypertension   . Rash     fine rash on abd  . Anemia   . Wears glasses   . 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 Iron deficiency anemia, unspecified 02/25/2014  . BRCA1 positive     c.190T>G (p.Cys64Gly) @ [facility]   . Shortness of breath      when Hemoglobin low  . History of blood transfusion   . DVT (deep venous thrombosis)     Right Subclavian and IJ.    PAST SURGICAL HISTORY: Past Surgical History  Procedure Laterality Date  . Cervical polypectomy  2010  . Cesarean section      one previous  . Tubal ligation    . Portacath placement Right 02/23/2014    Procedure: INSERTION PORT-A-CATH;  Surgeon: Joyice Faster. Cornett, MD;  Location: [facility];  Service: General;  Laterality: Right;  . Port-a-cath removal N/A 05/21/2014    Procedure: REMOVAL PORT-A-CATH;  Surgeon: Zenovia Jarred, MD;  Location: [facility];  Service: General;  Laterality: N/A;  . Tee without cardioversion N/A 05/26/2014    Procedure: TRANSESOPHAGEAL ECHOCARDIOGRAM (TEE);  Surgeon: Larey Dresser, MD;  Location: [facility];  Service: Cardiovascular;  Laterality: N/A;  .  Bilateral total mastectomy with axillary lymph node dissection Bilateral 07/28/2014    Procedure: BILATERAL TOTAL MASTECTOMY WITH LEFT AXILLARY SENTINEL LYMPH NODE BIOPSY;  Surgeon: Stark Klein, MD;  Location: [place];  Service: General;  Laterality: Bilateral;  . Breast reconstruction with placement of tissue expander and flex hd (acellular hydrated dermis) Bilateral 07/28/2014    Procedure: BILATERAL BREAST RECONSTRUCTION WITH PLACEMENT OF TISSUE EXPANDER AND FLEX HD (ACELLULAR HYDRATED DERMIS);  Surgeon: Theodoro Kos, DO;  Location: [place];  Service: Plastics;  Laterality: Bilateral;  . Removal of tissue expander and placement of implant Bilateral 10/14/2014    Procedure: REMOVAL OF BILATERAL BREAST  TISSUE EXPANDER  WITH PLASCEMENT OF BILATERAL  BREAST IMPLANTS;  Surgeon: Theodoro Kos, DO;  Location: [facility];  Service: Plastics;  Laterality: Bilateral;    FAMILY HISTORY Family History  Problem Relation Age of Onset  . Breast cancer Mother 50    currently 43  . Diabetes Father   . Pancreatic cancer Father 2  . Breast cancer Paternal Aunt 96    currently 68; BRCA1 positive  . Stroke Maternal Grandfather   . Cancer Paternal Aunt     unk. primary; deceased 45s  . Breast cancer Cousin     daughter of unaffected paternal aunt; dx in her 27s  [name] father died from prostate cancer the age of 16. [name] mother was diagnosed with breast cancer the age of 36. [name] father had 5 sisters, 3 of whom were diagnosed with breast cancer, 2 of them before the age of 93. The patient had one brother, no sisters. There is no history of ovarian cancer in the family.  GYNECOLOGIC HISTORY:  Menarche age 11, first live birth age 48, the patient is Tamara [name] P5. Her periods stopped at the time of chemotherapy. 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 Her husband Tamara [name] since works as an Animal nutritionist. The  patient's oldest child, a son, Tamara [name], is studying Therapist, occupational; the patient is a 27 year old daughter Tamara [name] is also in college. [name] younger children are 21, 21, and 88. The patient attends a Arboricult[employer] church   ADVANCED DIRECTIVES: Not in place   HEALTH MAINTENANCE: History  Substance Use Topics  . Smoking status: Former Smoker -- 0.25 packs/day for 15 years    Quit date: 02/18/2009  . Smokeless tobacco: Former Systems developer  . Alcohol Use: Yes     Comment: occasional   Mammogram: Colonoscopy: Bone Density Scan:  Pap Smear:  Eye Exam:  Vitamin D Level:   Lipid Panel:    Allergies  Allergen Reactions  . Compazine [Prochlorperazine Edisylate] Other (See Comments)    Stuttering    Current Outpatient Prescriptions  Medication Sig Dispense Refill  . acetaminophen (TYLENOL) 325 MG tablet Take 2 tablets (650 mg total) by mouth every 6 (six) hours as needed for mild pain (or Temp > 100).    . carvedilol (COREG) 12.5 MG tablet Take 1 tablet (12.5 mg total) by mouth 2 (two) times daily  with a meal. 60 tablet 3  . cyclobenzaprine (FLEXERIL) 10 MG tablet Take 1 tablet (10 mg total) by mouth 3 (three) times daily as needed for muscle spasms. 30 tablet 0  . diazepam (VALIUM) 2 MG tablet Take 1 tablet (2 mg total) by mouth every 6 (six) hours as needed for anxiety or muscle spasms. 30 tablet 0  . diphenhydrAMINE (BENADRYL) 25 mg capsule Take 25 mg by mouth every 6 (six) hours as needed for itching.    . docusate sodium (COLACE) 100 MG capsule Take 100 mg by mouth 2 (two) times daily as needed for mild constipation.    . enoxaparin (LOVENOX) 100 MG/ML injection Inject 0.95 mLs (95 mg total) into the skin every 12 (twelve) hours. 60 Syringe 0  . ferrous sulfate 325 (65 FE) MG tablet Take 325 mg by mouth 3 (three) times daily with meals.    . hydrALAZINE (APRESOLINE) 50 MG tablet Take 1 tablet (50 mg total) by mouth every 8 (eight) hours. 90 tablet 6  . HYDROcodone-homatropine  (HYCODAN) 5-1.5 MG/5ML syrup Take 5 mLs by mouth every 6 (six) hours as needed for cough. 120 mL 0  . ibuprofen (ADVIL,MOTRIN) 600 MG tablet Take 600 mg by mouth every 6 (six) hours as needed for mild pain.    Marland Kitchen ipratropium (ATROVENT) 0.03 % nasal spray Place 2 sprays into the nose 2 (two) times daily. PRN congestion 30 mL 0  . lidocaine-prilocaine (EMLA) cream Apply 1 application topically as needed (For port-a-cath.). 30 g 1  . lisinopril (PRINIVIL,ZESTRIL) 40 MG tablet Take 1 tablet (40 mg total) by mouth daily. 30 tablet 6  . loratadine (CLARITIN) 10 MG tablet Take 10 mg by mouth daily as needed (For inflammation after receiving her injections during chemo.).     Marland Kitchen LORazepam (ATIVAN) 0.5 MG tablet Take 1 tablet (0.5 mg total) by mouth every 6 (six) hours as needed (Nausea or vomiting). 30 tablet 0  . ondansetron (ZOFRAN) 8 MG tablet Take 8 mg by mouth 2 (two) times daily as needed for nausea or vomiting (taken on chemo days).     Marland Kitchen oxyCODONE (OXY IR/ROXICODONE) 5 MG immediate release tablet Take 1 tablet (5 mg total) by mouth every 4 (four) hours as needed for severe pain. 30 tablet 0  . traMADol (ULTRAM) 50 MG tablet Take 50 mg by mouth every 12 (twelve) hours as needed for moderate pain.     No current facility-administered medications for this visit.    OBJECTIVE: Young-appearing African American woman Who appears stated age There were no vitals filed for this visit.   There is no weight on file to calculate BMI.      ECOG FS:1 - Symptomatic but completely ambulatory  Skin: warm, dry  HEENT: sclerae anicteric, conjunctivae pink, oropharynx clear. No thrush or mucositis.  Lymph Nodes: No cervical or supraclavicular lymphadenopathy  Lungs: clear to auscultation bilaterally, no rales, wheezes, or rhonci  Heart: regular rate and rhythm  Abdomen: round, soft, non tender, positive bowel sounds  Musculoskeletal: No focal spinal tenderness, no peripheral edema  Neuro: non focal, well  oriented, positive affect    LAB RESULTS:  CMP     Component Value Date/Time   NA 141 11/09/2014 1217   NA 141 09/13/2014 1200   K 3.8 11/09/2014 1217   K 3.9 09/13/2014 1200   CL 103 09/13/2014 1200   CO2 22 11/09/2014 1217   CO2 24 09/13/2014 1200   GLUCOSE 89 11/09/2014 1217   GLUCOSE  96 09/13/2014 1200   BUN 13.6 11/09/2014 1217   BUN 16 09/13/2014 1200   CREATININE 1.0 11/09/2014 1217   CREATININE 0.90 09/13/2014 1200   CALCIUM 9.3 11/09/2014 1217   CALCIUM 9.7 09/13/2014 1200   PROT 6.8 11/09/2014 1217   PROT 6.1 08/07/2014 0300   ALBUMIN 3.8 11/09/2014 1217   ALBUMIN 2.7* 08/07/2014 0300   AST 14 11/09/2014 1217   AST 11 08/07/2014 0300   ALT 17 11/09/2014 1217   ALT 9 08/07/2014 0300   ALKPHOS 86 11/09/2014 1217   ALKPHOS 57 08/07/2014 0300   BILITOT <0.20 11/09/2014 1217   BILITOT <0.2* 08/07/2014 0300   GFRNONAA 78* 09/13/2014 1200   GFRAA >90 09/13/2014 1200    I No results found for: SPEP  Lab Results  Component Value Date   WBC 7.7 11/09/2014   NEUTROABS 4.4 11/09/2014   HGB 11.6 11/09/2014   HCT 36.5 11/09/2014   MCV 88.0 11/09/2014   PLT 289 11/09/2014        Chemistry      Component Value Date/Time   NA 141 11/09/2014 1217   NA 141 09/13/2014 1200   K 3.8 11/09/2014 1217   K 3.9 09/13/2014 1200   CL 103 09/13/2014 1200   CO2 22 11/09/2014 1217   CO2 24 09/13/2014 1200   BUN 13.6 11/09/2014 1217   BUN 16 09/13/2014 1200   CREATININE 1.0 11/09/2014 1217   CREATININE 0.90 09/13/2014 1200      Component Value Date/Time   CALCIUM 9.3 11/09/2014 1217   CALCIUM 9.7 09/13/2014 1200   ALKPHOS 86 11/09/2014 1217   ALKPHOS 57 08/07/2014 0300   AST 14 11/09/2014 1217   AST 11 08/07/2014 0300   ALT 17 11/09/2014 1217   ALT 9 08/07/2014 0300   BILITOT <0.20 11/09/2014 1217   BILITOT <0.2* 08/07/2014 0300       No results found for: LABCA2  No components found for: LABCA125  No results for input(s): INR in the last 168  hours.  Urinalysis    Component Value Date/Time   COLORURINE AMBER* 05/19/2014 2001   APPEARANCEUR HAZY* 05/19/2014 2001   LABSPEC 1.020 09/02/2014 1142   LABSPEC 1.022 05/19/2014 2001   PHURINE 5.5 05/19/2014 2001   GLUCOSEU Negative 09/02/2014 1142   GLUCOSEU NEGATIVE 05/19/2014 2001   HGBUR SMALL* 05/19/2014 2001   BILIRUBINUR SMALL* 05/19/2014 2001   KETONESUR 15* 05/19/2014 2001   PROTEINUR 100* 05/19/2014 2001   UROBILINOGEN 0.2 09/02/2014 1142   UROBILINOGEN 1.0 05/19/2014 2001   NITRITE NEGATIVE 05/19/2014 2001   LEUKOCYTESUR MODERATE* 05/19/2014 2001    STUDIES: Most recent echocardiogram on 09/06/14 showed an EF of 45-50%; however echocardiogram the following week showed a normal ejection fraction  Assessment: 43 y.o. BRCA-1 positive [place] woman  1. S/p left breast upper outer quadrant biopsy of two separate breast masses and one axillary lymph node 01/29/2014 for a , clinical mT2 N1 stage IIB, invasive ductal carcinoma, grade 2-3,  estrogen and progesterone receptor negative, with an MIB-1 between 79-100%, and HER 2 amplified  2. completed 6 cycles of carboplatin, docetaxel, trastuzumab and pertuzumab 06/30/2014 with MRI 07/07/2014 showing a complete radiologic response  3. trastuzumab was to be continued to complete a year (through March 2016); however echocardiogram 07/05/2014 showed an ejection fraction of 45-50%-- trastuzumab was held after 06/30/2014 doseuntil EF recovery  (a) cath report from 09/13/2014 shows normal coronaries and a normal left ventricular function with an ejection fraction of 55%.  4.  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   5. MRSA port infection and septicemia mid-June 2015;  Port was removed, and  PICC line placed, completed antibiotic therapy with ANCEF; PICC subsequently pulled   6 genetic testing with the [facility] panel and was found to have a pathogenic mutation in the BRCA1 gene called  c.190T>G (p.Cys64Gly  7. status post bilateral mastectomies and left axillary sentinel lymph node biopsy, withimmediate expander placement, on 07/28/14; the pathology ([id number]) showed a complete pathologic response in the left breast and the 3 sentinel lymph nodes sampled; the right breast was benign  8. Bilateral salpingo-oophorectomy pending.  PLAN:  Valicia is doing well today. Her blood pressure was elevated despite taking her home meds, so I wrote for clonidine 0.41m PO x 1 and this brought it down quickly. We will proceed with her trastuzumab today.   KNakyiawill have a cardiac MRI performed in January as echocardiograms produce poor results with breast implants. She will follow up with Dr. MAundra Dubinat this time. Otherwise we will continue the trastuzumab infusions every 3 weeks to complete the 9 months of treatment left.   KCannonunderstands and agrees with this plan. She knows the goal of treatment in her case is cure. She has bene encouraged to call with any issues that might arise before  Her next visit here.   FMarcelino Duster NP  11/09/2014 4:35 PM

## 2014-11-10 ENCOUNTER — Telehealth: Payer: Self-pay | Admitting: *Deleted

## 2014-11-10 ENCOUNTER — Telehealth: Payer: Self-pay | Admitting: Nurse Practitioner

## 2014-11-10 NOTE — Telephone Encounter (Signed)
Per staff message and POF I have scheduled appts. Advised scheduler of appts. JMW  

## 2014-11-11 ENCOUNTER — Other Ambulatory Visit: Payer: Self-pay | Admitting: *Deleted

## 2014-11-11 ENCOUNTER — Encounter (HOSPITAL_COMMUNITY): Payer: Self-pay | Admitting: Internal Medicine

## 2014-11-11 ENCOUNTER — Encounter: Payer: Self-pay | Admitting: *Deleted

## 2014-11-30 ENCOUNTER — Ambulatory Visit (HOSPITAL_BASED_OUTPATIENT_CLINIC_OR_DEPARTMENT_OTHER): Payer: Medicaid Other

## 2014-11-30 ENCOUNTER — Other Ambulatory Visit (HOSPITAL_BASED_OUTPATIENT_CLINIC_OR_DEPARTMENT_OTHER): Payer: Medicaid Other

## 2014-11-30 VITALS — BP 178/83 | HR 66 | Temp 97.1°F

## 2014-11-30 DIAGNOSIS — C50919 Malignant neoplasm of unspecified site of unspecified female breast: Secondary | ICD-10-CM

## 2014-11-30 DIAGNOSIS — C50412 Malignant neoplasm of upper-outer quadrant of left female breast: Secondary | ICD-10-CM

## 2014-11-30 DIAGNOSIS — Z5112 Encounter for antineoplastic immunotherapy: Secondary | ICD-10-CM

## 2014-11-30 LAB — CBC WITH DIFFERENTIAL/PLATELET
BASO%: 0.6 % (ref 0.0–2.0)
Basophils Absolute: 0 10*3/uL (ref 0.0–0.1)
EOS%: 2.6 % (ref 0.0–7.0)
Eosinophils Absolute: 0.2 10*3/uL (ref 0.0–0.5)
HEMATOCRIT: 38.9 % (ref 34.8–46.6)
HGB: 12.5 g/dL (ref 11.6–15.9)
LYMPH%: 23.2 % (ref 14.0–49.7)
MCH: 28.4 pg (ref 25.1–34.0)
MCHC: 32 g/dL (ref 31.5–36.0)
MCV: 88.7 fL (ref 79.5–101.0)
MONO#: 0.4 10*3/uL (ref 0.1–0.9)
MONO%: 5.3 % (ref 0.0–14.0)
NEUT%: 68.3 % (ref 38.4–76.8)
NEUTROS ABS: 5.3 10*3/uL (ref 1.5–6.5)
Platelets: 286 10*3/uL (ref 145–400)
RBC: 4.39 10*6/uL (ref 3.70–5.45)
RDW: 15.3 % — ABNORMAL HIGH (ref 11.2–14.5)
WBC: 7.8 10*3/uL (ref 3.9–10.3)
lymph#: 1.8 10*3/uL (ref 0.9–3.3)

## 2014-11-30 LAB — COMPREHENSIVE METABOLIC PANEL (CC13)
ALT: 11 U/L (ref 0–55)
AST: 13 U/L (ref 5–34)
Albumin: 3.9 g/dL (ref 3.5–5.0)
Alkaline Phosphatase: 91 U/L (ref 40–150)
Anion Gap: 10 mEq/L (ref 3–11)
BUN: 19.6 mg/dL (ref 7.0–26.0)
CO2: 22 meq/L (ref 22–29)
CREATININE: 0.9 mg/dL (ref 0.6–1.1)
Calcium: 9.3 mg/dL (ref 8.4–10.4)
Chloride: 108 mEq/L (ref 98–109)
EGFR: >90 mL/min/{1.73_m2} (ref >90)
Glucose: 117 mg/dl (ref 70–140)
Potassium: 3.6 mEq/L (ref 3.5–5.1)
SODIUM: 141 meq/L (ref 136–145)
Total Bilirubin: 0.35 mg/dL (ref 0.20–1.20)
Total Protein: 6.9 g/dL (ref 6.4–8.3)

## 2014-11-30 MED ORDER — ACETAMINOPHEN 325 MG PO TABS
650.0000 mg | ORAL_TABLET | Freq: Once | ORAL | Status: AC
  Administered 2014-11-30: 650 mg via ORAL

## 2014-11-30 MED ORDER — DIPHENHYDRAMINE HCL 25 MG PO CAPS
ORAL_CAPSULE | ORAL | Status: AC
  Filled 2014-11-30: qty 1

## 2014-11-30 MED ORDER — SODIUM CHLORIDE 0.9 % IV SOLN
6.0000 mg/kg | Freq: Once | INTRAVENOUS | Status: AC
  Administered 2014-11-30: 567 mg via INTRAVENOUS
  Filled 2014-11-30: qty 27

## 2014-11-30 MED ORDER — CLONIDINE HCL 0.1 MG PO TABS
ORAL_TABLET | ORAL | Status: AC
  Filled 2014-11-30: qty 1

## 2014-11-30 MED ORDER — SODIUM CHLORIDE 0.9 % IV SOLN
Freq: Once | INTRAVENOUS | Status: AC
  Administered 2014-11-30: 13:00:00 via INTRAVENOUS

## 2014-11-30 MED ORDER — ACETAMINOPHEN 325 MG PO TABS
ORAL_TABLET | ORAL | Status: AC
  Filled 2014-11-30: qty 2

## 2014-11-30 MED ORDER — DIPHENHYDRAMINE HCL 25 MG PO CAPS
25.0000 mg | ORAL_CAPSULE | Freq: Once | ORAL | Status: AC
  Administered 2014-11-30: 25 mg via ORAL

## 2014-11-30 MED ORDER — CLONIDINE HCL 0.1 MG PO TABS
0.1000 mg | ORAL_TABLET | Freq: Once | ORAL | Status: AC
  Administered 2014-11-30: 0.1 mg via ORAL

## 2014-11-30 NOTE — Patient Instructions (Signed)
[facility] Discharge Instructions for Patients Receiving Chemotherapy  Today you received the following chemotherapy agents Herceptin.  To help prevent nausea and vomiting after your treatment, we encourage you to take your nausea medication as prescribed.   If you develop nausea and vomiting that is not controlled by your nausea medication, call the clinic.   BELOW ARE SYMPTOMS THAT SHOULD BE REPORTED IMMEDIATELY:  *FEVER GREATER THAN 100.5 F  *CHILLS WITH OR WITHOUT FEVER  NAUSEA AND VOMITING THAT IS NOT CONTROLLED WITH YOUR NAUSEA MEDICATION  *UNUSUAL SHORTNESS OF BREATH  *UNUSUAL BRUISING OR BLEEDING  TENDERNESS IN MOUTH AND THROAT WITH OR WITHOUT PRESENCE OF ULCERS  *URINARY PROBLEMS  *BOWEL PROBLEMS  UNUSUAL RASH Items with * indicate a potential emergency and should be followed up as soon as possible.  Feel free to call the clinic you have any questions or concerns. The clinic phone number is [phone].    

## 2014-12-04 ENCOUNTER — Emergency Department (HOSPITAL_COMMUNITY): Payer: Medicaid Other

## 2014-12-04 ENCOUNTER — Emergency Department (HOSPITAL_COMMUNITY)
Admission: EM | Admit: 2014-12-04 | Discharge: 2014-12-04 | Disposition: A | Discharge status: Home / Self Care | Payer: Medicaid Other | Attending: Emergency Medicine | Admitting: Emergency Medicine

## 2014-12-04 ENCOUNTER — Encounter (HOSPITAL_COMMUNITY): Payer: Self-pay | Admitting: Emergency Medicine

## 2014-12-04 DIAGNOSIS — Z9889 Other specified postprocedural states: Secondary | ICD-10-CM | POA: Insufficient documentation

## 2014-12-04 DIAGNOSIS — R079 Chest pain, unspecified: Secondary | ICD-10-CM | POA: Diagnosis present

## 2014-12-04 DIAGNOSIS — I10 Essential (primary) hypertension: Secondary | ICD-10-CM | POA: Diagnosis not present

## 2014-12-04 DIAGNOSIS — Z87891 Personal history of nicotine dependence: Secondary | ICD-10-CM | POA: Diagnosis not present

## 2014-12-04 DIAGNOSIS — Z853 Personal history of malignant neoplasm of breast: Secondary | ICD-10-CM | POA: Diagnosis not present

## 2014-12-04 DIAGNOSIS — D509 Iron deficiency anemia, unspecified: Secondary | ICD-10-CM | POA: Insufficient documentation

## 2014-12-04 DIAGNOSIS — Z7901 Long term (current) use of anticoagulants: Secondary | ICD-10-CM | POA: Diagnosis not present

## 2014-12-04 DIAGNOSIS — Z973 Presence of spectacles and contact lenses: Secondary | ICD-10-CM | POA: Diagnosis not present

## 2014-12-04 DIAGNOSIS — M79602 Pain in left arm: Secondary | ICD-10-CM | POA: Diagnosis not present

## 2014-12-04 DIAGNOSIS — Z8742 Personal history of other diseases of the female genital tract: Secondary | ICD-10-CM | POA: Insufficient documentation

## 2014-12-04 DIAGNOSIS — R0602 Shortness of breath: Secondary | ICD-10-CM | POA: Insufficient documentation

## 2014-12-04 DIAGNOSIS — Z79899 Other long term (current) drug therapy: Secondary | ICD-10-CM | POA: Diagnosis not present

## 2014-12-04 DIAGNOSIS — Z86718 Personal history of other venous thrombosis and embolism: Secondary | ICD-10-CM | POA: Diagnosis not present

## 2014-12-04 DIAGNOSIS — M25512 Pain in left shoulder: Secondary | ICD-10-CM | POA: Insufficient documentation

## 2014-12-04 DIAGNOSIS — R0789 Other chest pain: Secondary | ICD-10-CM

## 2014-12-04 LAB — CBC WITH DIFFERENTIAL/PLATELET
Basophils Absolute: 0 10*3/uL (ref 0.0–0.1)
Basophils Relative: 0 % (ref 0–1)
EOS ABS: 0.2 10*3/uL (ref 0.0–0.7)
Eosinophils Relative: 3 % (ref 0–5)
HEMATOCRIT: 37.9 % (ref 36.0–46.0)
HEMOGLOBIN: 12.4 g/dL (ref 12.0–15.0)
Lymphocytes Relative: 37 % (ref 12–46)
Lymphs Abs: 2.5 10*3/uL (ref 0.7–4.0)
MCH: 28.5 pg (ref 26.0–34.0)
MCHC: 32.7 g/dL (ref 30.0–36.0)
MCV: 87.1 fL (ref 78.0–100.0)
MONOS PCT: 7 % (ref 3–12)
Monocytes Absolute: 0.5 10*3/uL (ref 0.1–1.0)
Neutro Abs: 3.5 10*3/uL (ref 1.7–7.7)
Neutrophils Relative %: 53 % (ref 43–77)
Platelets: 290 10*3/uL (ref 150–400)
RBC: 4.35 MIL/uL (ref 3.87–5.11)
RDW: 13.8 % (ref 11.5–15.5)
WBC: 6.7 10*3/uL (ref 4.0–10.5)

## 2014-12-04 LAB — I-STAT TROPONIN, ED
Troponin i, poc: 0 ng/mL (ref 0.00–0.08)
Troponin i, poc: 0 ng/mL (ref 0.00–0.08)

## 2014-12-04 LAB — BASIC METABOLIC PANEL
Anion gap: 8 (ref 5–15)
BUN: 15 mg/dL (ref 6–23)
CALCIUM: 9.2 mg/dL (ref 8.4–10.5)
CO2: 24 mmol/L (ref 19–32)
Chloride: 107 mEq/L (ref 96–112)
Creatinine, Ser: 0.78 mg/dL (ref 0.50–1.10)
GFR calc Af Amer: >90 mL/min (ref >90)
GLUCOSE: 81 mg/dL (ref 70–99)
Potassium: 3.7 mmol/L (ref 3.5–5.1)
Sodium: 139 mmol/L (ref 135–145)

## 2014-12-04 MED ORDER — IOHEXOL 350 MG/ML SOLN
100.0000 mL | Freq: Once | INTRAVENOUS | Status: AC | PRN
  Administered 2014-12-04: 100 mL via INTRAVENOUS

## 2014-12-04 MED ORDER — MORPHINE SULFATE 4 MG/ML IJ SOLN
4.0000 mg | Freq: Once | INTRAMUSCULAR | Status: AC
  Administered 2014-12-04: 4 mg via INTRAVENOUS
  Filled 2014-12-04: qty 1

## 2014-12-04 MED ORDER — ACETAMINOPHEN 325 MG PO TABS
650.0000 mg | ORAL_TABLET | Freq: Once | ORAL | Status: AC
  Administered 2014-12-04: 650 mg via ORAL
  Filled 2014-12-04: qty 2

## 2014-12-04 NOTE — ED Provider Notes (Signed)
CSN: [id number]     Arrival date & time 12/04/14  1418 History   First MD Initiated Contact with Patient 12/04/14 1430     Chief Complaint  Patient presents with  . Chest Pain     (Consider location/radiation/quality/duration/timing/severity/associated sxs/prior Treatment) HPI Comments: Patient with history of breast cancer status post surgery, implants, currently on Herceptin, history of right upper extremity and right jugular DVT on Lovenox -- presents with complaint of left sided intermittent chest pressure since 11 PM last night. Patient has had some left shoulder and left arm pain for the past several days as well. Patient has had sharp pain before but never pressure type pain. She has some baseline shortness of breath which seems to be unchanged. Her pain is not pleuritic in nature. No hemoptysis. Patient admits to only taking Lovenox once a day when she should be taking it twice a day. No prior PE. Patient was also evaluated for mild systolic heart failure as a cardiotoxic side effect from her chemotherapy. Patient has had multiple echocardiograms and a cardiac catheterization in 09/2014 showing ejection fraction of 55% and normal coronary arteries. No lower or upper extremity edema. EMS gave 324 mg of aspirin and 2 nitroglycerin prior to arrival.  Patient is a 44 y.o. female presenting with chest pain. The history is provided by the patient and medical records.  Chest Pain Associated symptoms: cough (baseline) and shortness of breath (baseline)   Associated symptoms: no abdominal pain, no back pain, no diaphoresis, no fever, no nausea, no palpitations and not vomiting     Past Medical History  Diagnosis Date  . Endometriosis   . Hypertension   . Rash     fine rash on abd  . Anemia   . Wears glasses   . 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 Iron deficiency anemia, unspecified 02/25/2014  . BRCA1 positive     c.190T>G (p.Cys64Gly) @ [facility]   . Shortness of breath      when  Hemoglobin low  . History of blood transfusion   . DVT (deep venous thrombosis)     Right Subclavian and IJ.   Past Surgical History  Procedure Laterality Date  . Cervical polypectomy  2010  . Cesarean section      one previous  . Tubal ligation    . Portacath placement Right 02/23/2014    Procedure: INSERTION PORT-A-CATH;  Surgeon: Joyice Faster. Cornett, MD;  Location: [place];  Service: General;  Laterality: Right;  . Port-a-cath removal N/A 05/21/2014    Procedure: REMOVAL PORT-A-CATH;  Surgeon: Zenovia Jarred, MD;  Location: [place];  Service: General;  Laterality: N/A;  . Tee without cardioversion N/A 05/26/2014    Procedure: TRANSESOPHAGEAL ECHOCARDIOGRAM (TEE);  Surgeon: Larey Dresser, MD;  Location[facility] ENDOSCOPY;  Service: Cardiovascular;  Laterality: N/A;  . Bilateral total mastectomy with axillary lymph node dissection Bilateral 07/28/2014    Procedure: BILATERAL TOTAL MASTECTOMY WITH LEFT AXILLARY SENTINEL LYMPH NODE BIOPSY;  Surgeon: Stark Klein, MD;  Location: [place];  Service: General;  Laterality: Bilateral;  . Breast reconstruction with placement of tissue expander and flex hd (acellular hydrated dermis) Bilateral 07/28/2014    Procedure: BILATERAL BREAST RECONSTRUCTION WITH PLACEMENT OF TISSUE EXPANDER AND FLEX HD (ACELLULAR HYDRATED DERMIS);  Surgeon: Theodoro Kos, DO;  Location: [place];  Service: Plastics;  Laterality: Bilateral;  . Removal of tissue expander and placement of implant Bilateral 10/14/2014    Procedure: REMOVAL OF BILATERAL BREAST  TISSUE EXPANDER  WITH PLASCEMENT OF BILATERAL  BREAST IMPLANTS;  Surgeon: Theodoro Kos, DO;  Location: [place];  Service: Plastics;  Laterality: Bilateral;  . Left heart catheterization with coronary angiogram N/A 09/13/2014    Procedure: LEFT HEART CATHETERIZATION WITH CORONARY ANGIOGRAM;  Surgeon: Jolaine Artist, MD;  Location: [facility] CATH LAB;  Service: Cardiovascular;  Laterality: N/A;   Family  History  Problem Relation Age of Onset  . Breast cancer Mother 44    currently 64  . Diabetes Father   . Pancreatic cancer Father 61  . Breast cancer Paternal Aunt 28    currently 6; BRCA1 positive  . Stroke Maternal Grandfather   . Cancer Paternal Aunt     unk. primary; deceased 42s  . Breast cancer Cousin     daughter of unaffected paternal aunt; dx in her 66s   History  Substance Use Topics  . Smoking status: Former Smoker -- 0.25 packs/day for 15 years    Quit date: 02/18/2009  . Smokeless tobacco: Former Systems developer  . Alcohol Use: Yes     Comment: occasional   OB History    Gravida Para Term Preterm AB TAB SAB Ectopic Multiple Living   5 5 5       5      Review of Systems  Constitutional: Negative for fever and diaphoresis.  Eyes: Negative for redness.  Respiratory: Positive for cough (baseline) and shortness of breath (baseline).   Cardiovascular: Positive for chest pain. Negative for palpitations and leg swelling.  Gastrointestinal: Negative for nausea, vomiting and abdominal pain.  Genitourinary: Negative for dysuria.  Musculoskeletal: Positive for myalgias. Negative for back pain and neck pain.  Skin: Negative for rash.  Neurological: Negative for syncope and light-headedness.      Allergies  Compazine  Home Medications   Prior to Admission medications   Medication Sig Start Date End Date Taking? Authorizing Provider  acetaminophen (TYLENOL) 325 MG tablet Take 2 tablets (650 mg total) by mouth every 6 (six) hours as needed for mild pain (or Temp > 100). 07/30/14   Shawn Rayburn, PA-C  carvedilol (COREG) 12.5 MG tablet Take 1 tablet (12.5 mg total) by mouth 2 (two) times daily with a meal. 10/20/14   Larey Dresser, MD  cyclobenzaprine (FLEXERIL) 10 MG tablet Take 1 tablet (10 mg total) by mouth 3 (three) times daily as needed for muscle spasms. 07/31/14   Shawn Rayburn, PA-C  diazepam (VALIUM) 2 MG tablet Take 1 tablet (2 mg total) by mouth every 6 (six) hours as  needed for anxiety or muscle spasms. 09/10/14   Marcelino Duster, NP  diphenhydrAMINE (BENADRYL) 25 mg capsule Take 25 mg by mouth every 6 (six) hours as needed for itching.    Historical Provider, MD  docusate sodium (COLACE) 100 MG capsule Take 100 mg by mouth 2 (two) times daily as needed for mild constipation.    Historical Provider, MD  enoxaparin (LOVENOX) 100 MG/ML injection Inject 0.95 mLs (95 mg total) into the skin every 12 (twelve) hours. 09/07/14   [name], MD  ferrous sulfate 325 (65 FE) MG tablet Take 325 mg by mouth 3 (three) times daily with meals.    Historical Provider, MD  hydrALAZINE (APRESOLINE) 50 MG tablet Take 1 tablet (50 mg total) by mouth every 8 (eight) hours. 07/22/14   Jolaine Artist, MD  HYDROcodone-homatropine [facility]) 5-1.5 MG/5ML syrup Take 5 mLs by mouth every 6 (six) hours as needed for cough. 09/01/14  Hannah Muthersbaugh, PA-C  ibuprofen (ADVIL,MOTRIN) 600 MG tablet Take 600 mg by mouth every 6 (six) hours as needed for mild pain.    Historical Provider, MD  ipratropium (ATROVENT) 0.03 % nasal spray Place 2 sprays into the nose 2 (two) times daily. PRN congestion 09/01/14   Hannah Muthersbaugh, PA-C  lidocaine-prilocaine (EMLA) cream Apply 1 application topically as needed (For port-a-cath.). 09/07/14   [name], MD  lisinopril (PRINIVIL,ZESTRIL) 40 MG tablet Take 1 tablet (40 mg total) by mouth daily. 10/20/14   Larey Dresser, MD  loratadine (CLARITIN) 10 MG tablet Take 10 mg by mouth daily as needed (For inflammation after receiving her injections during chemo.).     Historical Provider, MD  LORazepam (ATIVAN) 0.5 MG tablet Take 1 tablet (0.5 mg total) by mouth every 6 (six) hours as needed (Nausea or vomiting). 07/05/14   Lennis Marion Downer, MD  ondansetron (ZOFRAN) 8 MG tablet Take 8 mg by mouth 2 (two) times daily as needed for nausea or vomiting (taken on chemo days).     Historical Provider, MD  oxyCODONE (OXY IR/ROXICODONE) 5 MG immediate  release tablet Take 1 tablet (5 mg total) by mouth every 4 (four) hours as needed for severe pain. 09/10/14   Marcelino Duster, NP  traMADol (ULTRAM) 50 MG tablet Take 50 mg by mouth every 12 (twelve) hours as needed for moderate pain.    Historical Provider, MD   BP 130/88 mmHg  Temp(Src) 98.1 F (36.7 C) (Oral)  Resp 17  Ht 5' 5"  (1.651 m)  Wt 204 lb (92.534 kg)  BMI 33.95 kg/m2   Physical Exam  Constitutional: She appears well-developed and well-nourished.  HENT:  Head: Normocephalic and atraumatic.  Mouth/Throat: Mucous membranes are normal. Mucous membranes are not dry.  Eyes: Conjunctivae are normal.  Neck: Trachea normal and normal range of motion. Neck supple. Normal carotid pulses and no JVD present. No muscular tenderness present. Carotid bruit is not present. No tracheal deviation present.  Cardiovascular: Normal rate, regular rhythm, S1 normal, S2 normal, normal heart sounds and intact distal pulses.  Exam reveals no decreased pulses.   No murmur heard. Pulmonary/Chest: Effort normal. No respiratory distress. She has no wheezes. She exhibits no tenderness.  Abdominal: Soft. Normal aorta and bowel sounds are normal. There is no tenderness. There is no rebound and no guarding.  Musculoskeletal: Normal range of motion.  Neurological: She is alert.  Skin: Skin is warm and dry. She is not diaphoretic. No cyanosis. No pallor.  Psychiatric: She has a normal mood and affect.  Nursing note and vitals reviewed.   ED Course  Procedures (including critical care time) Labs Review Labs Reviewed  CBC WITH DIFFERENTIAL  BASIC METABOLIC PANEL  BRAIN NATRIURETIC PEPTIDE  I-STAT [place], ED    Imaging Review No results found.   EKG Interpretation None      2:44 PM Patient seen and examined. Work-up initiated. Given surgery history, DVT history, non-compliance with Lovenox -- will need CTA to eval for PE. She is stable currently.    Vital signs reviewed and are as  follows: BP 130/88 mmHg  Temp(Src) 98.1 F (36.7 C) (Oral)  Resp 17  Ht 5' 5"  (1.651 m)  Wt 204 lb (92.534 kg)  BMI 33.95 kg/m2  4:13 PM Pending CT.   Handoff Dr. Reather Converse at shift change.   Plan: Follow-up BNP, CT, if she has developed PE would likely need admitted. If neg, she can go home on her anticoagulant.  Date: 12/04/2014  Rate: 76  Rhythm: normal sinus rhythm  QRS Axis: normal  Intervals: normal  ST/T Wave abnormalities: nonspecific T wave changes  Conduction Disutrbances:none  Narrative Interpretation:   Old EKG Reviewed: unchanged    MDM   Final diagnoses:  Chest pressure   Patient with known risk factors for PE, new chest pressure, pending CT angio. No clinical CHF. Vitals stable.     Carlisle Cater, PA-C 12/04/14 1616  Tanna Furry, MD 12/12/14 [phone]

## 2014-12-04 NOTE — Discharge Instructions (Signed)
If you were given medicines take as directed.  If you are on coumadin or contraceptives realize their levels and effectiveness is altered by many different medicines.  If you have any reaction (rash, tongues swelling, other) to the medicines stop taking and see a physician.   Have blood pressure rechecked in 48 hrs.  Please follow up as directed and return to the ER or see a physician for new or worsening symptoms.  Thank you. Filed Vitals:   12/04/14 1445 12/04/14 1515 12/04/14 1545 12/04/14 1632  BP: 148/97 157/105 169/105 173/101  Pulse: 69 64 60   Temp:      TempSrc:      Resp: 13 14 15 17   Height:      Weight:      SpO2: 99% 99% 99% 100%    Chest Pain (Nonspecific) It is often hard to give a diagnosis for the cause of chest pain. There is always a chance that your pain could be related to something serious, such as a heart attack or a blood clot in the lungs. You need to follow up with your doctor. HOME CARE  If antibiotic medicine was given, take it as directed by your doctor. Finish the medicine even if you start to feel better.  For the next few days, avoid activities that bring on chest pain. Continue physical activities as told by your doctor.  Do not use any tobacco products. This includes cigarettes, chewing tobacco, and e-cigarettes.  Avoid drinking alcohol.  Only take medicine as told by your doctor.  Follow your doctor's suggestions for more testing if your chest pain does not go away.  Keep all doctor visits you made. GET HELP IF:  Your chest pain does not go away, even after treatment.  You have a rash with blisters on your chest.  You have a fever. GET HELP RIGHT AWAY IF:   You have more pain or pain that spreads to your arm, neck, jaw, back, or belly (abdomen).  You have shortness of breath.  You cough more than usual or cough up blood.  You have very bad back or belly pain.  You feel sick to your stomach (nauseous) or throw up (vomit).  You have  very bad weakness.  You pass out (faint).  You have chills. This is an emergency. Do not wait to see if the problems will go away. Call your local emergency services (911 in [place] Do not drive yourself to the hospital. MAKE SURE YOU:   Understand these instructions.  Will watch your condition.  Will get help right away if you are not doing well or get worse. Document Released: 05/07/2008 Document Revised: 11/24/2013 Document Reviewed: 05/07/2008 [facility] Patient Information 2015 [place], [place]. This information is not intended to replace advice given to you by your health care provider. Make sure you discuss any questions you have with your health care provider.

## 2014-12-04 NOTE — ED Notes (Signed)
Pt starting having chest pressure on L side last night at 11pm. Pain radiates to left arm. Pt has CA in L breast and had double mastectomy in August. Pt had implants in November. Pt seeing cardiologist for systolic HF. Pt had cath in Oct. And pt states MD did  Not find anything. Pt has blood clots in right arm. 148/90 BP, HR 80's SR.

## 2014-12-04 NOTE — ED Notes (Signed)
EMS gave 324mg  ASA and 2 nitro. Pt states chest pain is now 7/10.

## 2014-12-04 NOTE — ED Provider Notes (Signed)
Patient signed out to me to follow-up CT angio chest and if unremarkable sent home with outpatient follow-up. Patient is delta troponin negative. Patient feels improved no active chest pain. CT results reviewed no acute pulmonary embolism no acute findings. Outpatient follow-up discussed.  Ct Angio Chest Pe W/cm &/or Wo Cm  12/04/2014   CLINICAL DATA:  Chest tightness since last night. Pt has been sob since starting chemo for left side breast ca in March. Hx of double mastectomy. Reconstruction surg in November.  EXAM: CT ANGIOGRAPHY CHEST WITH CONTRAST  TECHNIQUE: Multidetector CT imaging of the chest was performed using the standard protocol during bolus administration of intravenous contrast. Multiplanar CT image reconstructions and MIPs were obtained to evaluate the vascular anatomy.  CONTRAST:  117mL OMNIPAQUE IOHEXOL 350 MG/ML SOLN  COMPARISON:  05/11/2014  FINDINGS: SVC is patent. RV/LV ratio less than 1. Satisfactory opacification of pulmonary arteries noted, and there is no evidence of pulmonary emboli. Patent superior and inferior pulmonary veins bilaterally. Adequate contrast opacification of the thoracic aorta with no evidence of dissection, aneurysm, or stenosis. There is classic 3-vessel brachiocephalic arch anatomy without proximal stenosis.  Trace bilateral pleural effusions. No pericardial effusion. No pneumothorax. No hilar or mediastinal adenopathy. Bilateral breast implants. Mild dependent atelectasis posteriorly in both lower lobes. Lungs otherwise clear. Thoracic spine and sternum intact. Visualized portions of upper abdomen unremarkable.  Review of the MIP images confirms the above findings.  IMPRESSION: 1. Negative.  No acute PE or thoracic aortic dissection.   Electronically Signed   By: Arne Cleveland M.D.   On: 12/04/2014 16:38   Results and differential diagnosis were discussed with the patient/parent/guardian. Close follow up outpatient was discussed, comfortable with the plan.    Medications  acetaminophen (TYLENOL) tablet 650 mg (not administered)  iohexol (OMNIPAQUE) 350 MG/ML injection 100 mL (100 mLs Intravenous Contrast Given 12/04/14 1616)  morphine 4 MG/ML injection 4 mg (4 mg Intravenous Given 12/04/14 1706)    Filed Vitals:   12/04/14 1645 12/04/14 1700 12/04/14 1715 12/04/14 1730  BP: 162/100 171/102 149/93 159/93  Pulse: 67 64 69 66  Temp:      TempSrc:      Resp: 19 9 14 17   Height:      Weight:      SpO2: 100% 99% 96% 97%    Final diagnoses:  Chest pressure      Mariea Clonts, MD 12/04/14 0093

## 2014-12-06 ENCOUNTER — Telehealth (HOSPITAL_COMMUNITY): Payer: Self-pay | Admitting: Vascular Surgery

## 2014-12-06 NOTE — Telephone Encounter (Signed)
PT was in the hospital over the weekend, pt was having cp and pain in the arm, she was having flutters, pt was wondering when he cardiac MRI will be scheduled...  please advise you call cell phone [phone]

## 2014-12-06 NOTE — Telephone Encounter (Signed)
Tamara [name] are you working on this?

## 2014-12-07 NOTE — Telephone Encounter (Signed)
Yes pre cert is pending with [employer]/ medicaid

## 2014-12-08 ENCOUNTER — Emergency Department (HOSPITAL_COMMUNITY): Payer: Medicaid Other

## 2014-12-08 ENCOUNTER — Emergency Department (HOSPITAL_COMMUNITY)
Admission: EM | Admit: 2014-12-08 | Discharge: 2014-12-08 | Disposition: A | Discharge status: Home / Self Care | Payer: Medicaid Other | Attending: Emergency Medicine | Admitting: Emergency Medicine

## 2014-12-08 ENCOUNTER — Encounter (HOSPITAL_COMMUNITY): Payer: Self-pay | Admitting: *Deleted

## 2014-12-08 DIAGNOSIS — R5383 Other fatigue: Secondary | ICD-10-CM | POA: Insufficient documentation

## 2014-12-08 DIAGNOSIS — R0602 Shortness of breath: Secondary | ICD-10-CM | POA: Diagnosis not present

## 2014-12-08 DIAGNOSIS — D649 Anemia, unspecified: Secondary | ICD-10-CM | POA: Insufficient documentation

## 2014-12-08 DIAGNOSIS — Z791 Long term (current) use of non-steroidal anti-inflammatories (NSAID): Secondary | ICD-10-CM | POA: Insufficient documentation

## 2014-12-08 DIAGNOSIS — Z1501 Genetic susceptibility to malignant neoplasm of breast: Secondary | ICD-10-CM | POA: Diagnosis not present

## 2014-12-08 DIAGNOSIS — Z86718 Personal history of other venous thrombosis and embolism: Secondary | ICD-10-CM | POA: Diagnosis not present

## 2014-12-08 DIAGNOSIS — Z7901 Long term (current) use of anticoagulants: Secondary | ICD-10-CM | POA: Diagnosis not present

## 2014-12-08 DIAGNOSIS — Z9889 Other specified postprocedural states: Secondary | ICD-10-CM | POA: Insufficient documentation

## 2014-12-08 DIAGNOSIS — I10 Essential (primary) hypertension: Secondary | ICD-10-CM | POA: Insufficient documentation

## 2014-12-08 DIAGNOSIS — R002 Palpitations: Secondary | ICD-10-CM | POA: Diagnosis not present

## 2014-12-08 DIAGNOSIS — R079 Chest pain, unspecified: Secondary | ICD-10-CM | POA: Diagnosis not present

## 2014-12-08 DIAGNOSIS — Z87891 Personal history of nicotine dependence: Secondary | ICD-10-CM | POA: Insufficient documentation

## 2014-12-08 DIAGNOSIS — Z8742 Personal history of other diseases of the female genital tract: Secondary | ICD-10-CM | POA: Diagnosis not present

## 2014-12-08 DIAGNOSIS — R197 Diarrhea, unspecified: Secondary | ICD-10-CM | POA: Diagnosis not present

## 2014-12-08 DIAGNOSIS — Z79891 Long term (current) use of opiate analgesic: Secondary | ICD-10-CM | POA: Diagnosis not present

## 2014-12-08 LAB — BASIC METABOLIC PANEL
ANION GAP: 11 (ref 5–15)
BUN: 17 mg/dL (ref 6–23)
CALCIUM: 9.5 mg/dL (ref 8.4–10.5)
CO2: 22 mmol/L (ref 19–32)
CREATININE: 0.96 mg/dL (ref 0.50–1.10)
Chloride: 108 mEq/L (ref 96–112)
GFR calc non Af Amer: 72 mL/min — ABNORMAL LOW (ref >90)
GFR, EST AFRICAN AMERICAN: 84 mL/min — AB (ref >90)
Glucose, Bld: 89 mg/dL (ref 70–99)
POTASSIUM: 3.9 mmol/L (ref 3.5–5.1)
Sodium: 141 mmol/L (ref 135–145)

## 2014-12-08 LAB — CBC
HCT: 38 % (ref 36.0–46.0)
Hemoglobin: 12.5 g/dL (ref 12.0–15.0)
MCH: 28.9 pg (ref 26.0–34.0)
MCHC: 32.9 g/dL (ref 30.0–36.0)
MCV: 87.8 fL (ref 78.0–100.0)
PLATELETS: 298 10*3/uL (ref 150–400)
RBC: 4.33 MIL/uL (ref 3.87–5.11)
RDW: 13.4 % (ref 11.5–15.5)
WBC: 7.8 10*3/uL (ref 4.0–10.5)

## 2014-12-08 LAB — I-STAT TROPONIN, ED
TROPONIN I, POC: 0 ng/mL (ref 0.00–0.08)
Troponin i, poc: 0.01 ng/mL (ref 0.00–0.08)

## 2014-12-08 LAB — BRAIN NATRIURETIC PEPTIDE: B NATRIURETIC PEPTIDE 5: 10.2 pg/mL (ref 0.0–100.0)

## 2014-12-08 MED ORDER — ONDANSETRON HCL 4 MG/2ML IJ SOLN: 4.0000 mg | Freq: Once | INTRAMUSCULAR | Status: DC

## 2014-12-08 MED ORDER — MORPHINE SULFATE 4 MG/ML IJ SOLN
4.0000 mg | Freq: Once | INTRAMUSCULAR | Status: AC
  Administered 2014-12-08: 4 mg via INTRAVENOUS
  Filled 2014-12-08: qty 1

## 2014-12-08 MED ORDER — ASPIRIN 81 MG PO CHEW
324.0000 mg | CHEWABLE_TABLET | Freq: Once | ORAL | Status: AC
  Administered 2014-12-08: 324 mg via ORAL
  Filled 2014-12-08: qty 4

## 2014-12-08 MED ORDER — METOCLOPRAMIDE HCL 5 MG/ML IJ SOLN
10.0000 mg | Freq: Once | INTRAMUSCULAR | Status: AC
  Administered 2014-12-08: 10 mg via INTRAVENOUS
  Filled 2014-12-08: qty 2

## 2014-12-08 NOTE — ED Provider Notes (Signed)
CSN: [id number]     Arrival date & time 12/08/14  1556 History   None    Chief Complaint  Patient presents with  . Chest Pain  . Shortness of Breath  . Migraine     (Consider location/radiation/quality/duration/timing/severity/associated sxs/prior Treatment) Patient is a 44 y.o. female presenting with chest pain, shortness of breath, and migraines.  Chest Pain Pain location:  L chest Pain quality: pressure (had on Saturday, now resolved) and sharp (starting yesterday)   Pain radiates to:  L arm Pain radiates to the back: no   Pain severity:  Severe Onset quality:  Gradual Duration:  2 days Timing:  Intermittent Progression:  Waxing and waning Chronicity:  New Relieved by:  Nothing Worsened by:  Exertion Ineffective treatments:  Leaning forward and certain positions Associated symptoms: fatigue, palpitations and shortness of breath   Associated symptoms: no abdominal pain, no back pain, no cough, no fever, no headache, no nausea, no syncope and not vomiting   Risk factors: hypertension, prior DVT/PE and surgery (november)   Risk factors: no birth control, no coronary artery disease, no diabetes mellitus, no high cholesterol and no smoking   Shortness of Breath Associated symptoms: chest pain   Associated symptoms: no abdominal pain, no cough, no fever, no headaches, no neck pain, no rash, no sore throat, no syncope and no vomiting   Migraine Associated symptoms include chest pain and fatigue. Pertinent negatives include no abdominal pain, coughing, fever, headaches, nausea, neck pain, rash, sore throat or vomiting.    Past Medical History  Diagnosis Date  . Endometriosis   . Hypertension   . Rash     fine rash on abd  . Anemia   . Wears glasses   . 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 Iron deficiency anemia, unspecified 02/25/2014  . BRCA1 positive     c.190T>G (p.Cys64Gly) @ [facility]   . Shortness of breath      when Hemoglobin low  . History of blood transfusion    . DVT (deep venous thrombosis)     Right Subclavian and IJ.   Past Surgical History  Procedure Laterality Date  . Cervical polypectomy  2010  . Cesarean section      one previous  . Tubal ligation    . Portacath placement Right 02/23/2014    Procedure: INSERTION PORT-A-CATH;  Surgeon: Joyice Faster. Cornett, MD;  Location: [place];  Service: General;  Laterality: Right;  . Port-a-cath removal N/A 05/21/2014    Procedure: REMOVAL PORT-A-CATH;  Surgeon: Zenovia Jarred, MD;  Location: [place];  Service: General;  Laterality: N/A;  . Tee without cardioversion N/A 05/26/2014    Procedure: TRANSESOPHAGEAL ECHOCARDIOGRAM (TEE);  Surgeon: Larey Dresser, MD;  Location[facility] ENDOSCOPY;  Service: Cardiovascular;  Laterality: N/A;  . Bilateral total mastectomy with axillary lymph node dissection Bilateral 07/28/2014    Procedure: BILATERAL TOTAL MASTECTOMY WITH LEFT AXILLARY SENTINEL LYMPH NODE BIOPSY;  Surgeon: Stark Klein, MD;  Location: [place];  Service: General;  Laterality: Bilateral;  . Breast reconstruction with placement of tissue expander and flex hd (acellular hydrated dermis) Bilateral 07/28/2014    Procedure: BILATERAL BREAST RECONSTRUCTION WITH PLACEMENT OF TISSUE EXPANDER AND FLEX HD (ACELLULAR HYDRATED DERMIS);  Surgeon: Theodoro Kos, DO;  Location: [place];  Service: Plastics;  Laterality: Bilateral;  . Removal of tissue expander and placement of implant Bilateral 10/14/2014    Procedure: REMOVAL OF BILATERAL BREAST  TISSUE EXPANDER  WITH PLASCEMENT OF BILATERAL  BREAST IMPLANTS;  Surgeon: Theodoro Kos, DO;  Location: [place];  Service: Plastics;  Laterality: Bilateral;  . Left heart catheterization with coronary angiogram N/A 09/13/2014    Procedure: LEFT HEART CATHETERIZATION WITH CORONARY ANGIOGRAM;  Surgeon: Jolaine Artist, MD;  Location: [facility] CATH LAB;  Service: Cardiovascular;  Laterality: N/A;   Family History  Problem Relation Age of Onset  . Breast  cancer Mother 70    currently 3  . Diabetes Father   . Pancreatic cancer Father 55  . Breast cancer Paternal Aunt 51    currently 67; BRCA1 positive  . Stroke Maternal Grandfather   . Cancer Paternal Aunt     unk. primary; deceased 37s  . Breast cancer Cousin     daughter of unaffected paternal aunt; dx in her 44s   History  Substance Use Topics  . Smoking status: Former Smoker -- 0.25 packs/day for 15 years    Quit date: 02/18/2009  . Smokeless tobacco: Former Systems developer  . Alcohol Use: Yes     Comment: occasional   OB History    Gravida Para Term Preterm AB TAB SAB Ectopic Multiple Living   _0 Review of Systems  Constitutional: Positive for fatigue. Negative for fever.  HENT: Negative for sore throat.   Eyes: Negative for visual disturbance.  Respiratory: Positive for shortness of breath. Negative for cough.   Cardiovascular: Positive for chest pain and palpitations. Negative for syncope.  Gastrointestinal: Positive for diarrhea. Negative for nausea, vomiting and abdominal pain.  Genitourinary: Negative for difficulty urinating.  Musculoskeletal: Negative for back pain and neck pain.  Skin: Negative for rash.  Neurological: Negative for syncope and headaches.      Allergies  Compazine  Home Medications   Prior to Admission medications   Medication Sig Start Date End Date Taking? Authorizing Provider  carvedilol (COREG) 12.5 MG tablet Take 1 tablet (12.5 mg total) by mouth 2 (two) times daily with a meal. 10/20/14  Yes Larey Dresser, MD  enoxaparin (LOVENOX) 100 MG/ML injection Inject 0.95 mLs (95 mg total) into the skin every 12 (twelve) hours. Patient taking differently: Inject 95 mg into the skin daily.  09/07/14  Yes [name], MD  ferrous sulfate 325 (65 FE) MG tablet Take 325 mg by mouth 3 (three) times daily with meals.   Yes Historical Provider, MD  hydrALAZINE (APRESOLINE) 50 MG tablet Take 1 tablet (50 mg total) by mouth every 8 (eight)  hours. 07/22/14  Yes Jolaine Artist, MD  ibuprofen (ADVIL,MOTRIN) 800 MG tablet Take 800 mg by mouth every 8 (eight) hours as needed (pain).   Yes Historical Provider, MD  lidocaine-prilocaine (EMLA) cream Apply 1 application topically as needed (For port-a-cath.). Patient taking differently: Apply 1 application topically daily. Prior to enoxaparin shots 09/07/14  Yes [name], MD  lisinopril (PRINIVIL,ZESTRIL) 40 MG tablet Take 1 tablet (40 mg total) by mouth daily. 10/20/14  Yes Larey Dresser, MD  LORazepam (ATIVAN) 0.5 MG tablet Take 1 tablet (0.5 mg total) by mouth every 6 (six) hours as needed (Nausea or vomiting). 07/05/14  Yes Lennis Marion Downer, MD  ondansetron (ZOFRAN) 8 MG tablet Take 8 mg by mouth 2 (two) times daily as needed for nausea or vomiting.    Yes Historical Provider, MD  oxyCODONE (OXY IR/ROXICODONE) 5 MG immediate release tablet Take 1 tablet (5 mg total) by mouth every 4 (four) hours  as needed for severe pain. 09/10/14  Yes Marcelino Duster, NP  traMADol (ULTRAM) 50 MG tablet Take 50 mg by mouth every 12 (twelve) hours as needed for moderate pain.   Yes Historical Provider, MD  acetaminophen (TYLENOL) 325 MG tablet Take 2 tablets (650 mg total) by mouth every 6 (six) hours as needed for mild pain (or Temp > 100). Patient not taking: Reported on 12/04/2014 07/30/14   Shawn Rayburn, PA-C  cyclobenzaprine (FLEXERIL) 10 MG tablet Take 1 tablet (10 mg total) by mouth 3 (three) times daily as needed for muscle spasms. Patient not taking: Reported on 12/04/2014 07/31/14   Shawn Rayburn, PA-C  diazepam (VALIUM) 2 MG tablet Take 1 tablet (2 mg total) by mouth every 6 (six) hours as needed for anxiety or muscle spasms. Patient not taking: Reported on 12/04/2014 09/10/14   Marcelino Duster, NP  HYDROcodone-homatropine [facility]) 5-1.5 MG/5ML syrup Take 5 mLs by mouth every 6 (six) hours as needed for cough. Patient not taking: Reported on 12/04/2014 09/01/14   Jarrett Soho Muthersbaugh, PA-C   ipratropium (ATROVENT) 0.03 % nasal spray Place 2 sprays into the nose 2 (two) times daily. PRN congestion Patient not taking: Reported on 12/04/2014 09/01/14   Jarrett Soho Muthersbaugh, PA-C   BP 101/73 mmHg  Pulse 75  Temp(Src) 97.9 F (36.6 C) (Oral)  Resp 20  SpO2 99% Physical Exam  Constitutional: She is oriented to person, place, and time. She appears well-developed and well-nourished. No distress.  HENT:  Head: Normocephalic and atraumatic.  Eyes: Conjunctivae and EOM are normal.  Neck: Normal range of motion.  Cardiovascular: Normal rate, regular rhythm, normal heart sounds and intact distal pulses.  Exam reveals no gallop and no friction rub.   No murmur heard. Pulmonary/Chest: Effort normal and breath sounds normal. No respiratory distress. She has no wheezes. She has no rales.  Abdominal: Soft. She exhibits no distension. There is no tenderness. There is no guarding.  Musculoskeletal: She exhibits no edema or tenderness.  Neurological: She is alert and oriented to person, place, and time.  Skin: Skin is warm and dry. No rash noted. She is not diaphoretic. No erythema.  Nursing note and vitals reviewed.   ED Course  Procedures (including critical care time) Labs Review Labs Reviewed  BASIC METABOLIC PANEL - Abnormal; Notable for the following:    GFR calc non Af Amer 72 (*)    GFR calc Af Amer 84 (*)    All other components within normal limits  CBC  BRAIN NATRIURETIC PEPTIDE  I-STAT TROPOININ, ED  Randolm Idol, ED    Imaging Review Dg Chest 2 View  12/08/2014   CLINICAL DATA:  Chest pain.  Shortness of breath.  EXAM: CHEST  2 VIEW  COMPARISON:  12/04/2014  FINDINGS: Mild Myocardial perfusion study: Mild cardiomegaly. No edema. The lungs appear clear.  Bilateral breast implants.  No pleural effusion.  IMPRESSION: 1. Stable mild cardiomegaly. Otherwise, no significant abnormalities are observed.   Electronically Signed   By: Sherryl Barters M.D.   On: 12/08/2014 19:12      EKG Interpretation   Date/Time:  Wednesday December 08 2014 16:01:08 EST Ventricular Rate:  82 PR Interval:  138 QRS Duration: 88 QT Interval:  370 QTC Calculation: 432 R Axis:   -17 Text Interpretation:  Normal sinus rhythm Cannot rule out Anterior infarct  , age undetermined Abnormal ECG nonspecific t wave abnormalities not  significantly changed from prior  Confirmed by [facility]  MD, TREY ([id number]) on  12/08/2014 5:57:23 PM      MDM   Final diagnoses:  Chest pain   44 year old female with a history of breast cancer on chemotherapy and Herceptin, right IJ subclavian DVT on Lovenox, hypertension presents with concern of chest pain. Patient was seen on Saturday for chest pressure and had a CTA PE study which showed no acute findings and delta troponins which were negative and she was discharged. Since that time she's had continuing palpitations and development of a sharp left-sided chest pain yesterday as well as dyspnea on exertion.  Given her recent negative CT PE study, normal oxygen saturation on room air, normal heart rate and compliance with Lovenox have low suspicion for pulmonary embolus.  History and physical not consistent with aortic dissection. EKG not consistent with pericarditis and pain is not positional.   Delta troponins negative, and BNP was 10.2.  Chest x-ray showed stable cardiomegaly without edema. Given these findings have low suspicion for ACS or CHF exacerbation.  Given continuing symptoms and Herceptin use discussed with Cardiology who feels she is appropriate for outpatient evaluation and management.  She was discharged and will be called by cardiology for close outpatient follow-up. She left in stable condition with understanding of reasons to return.   Alvino Chapel, MD 12/09/14 [place] III, MD 12/11/14 [phone]

## 2014-12-08 NOTE — Discharge Instructions (Signed)

## 2014-12-08 NOTE — ED Notes (Signed)
Pt in c/o chest pain with shortness of breath and migraines for the last week, seen for same earlier in the week, unable to make follow up appointment with cardiologist so she returned here

## 2014-12-09 NOTE — ED Provider Notes (Signed)
I saw and evaluated the patient, reviewed the resident's note and I agree with the findings and plan.   EKG Interpretation   Date/Time:  Wednesday December 08 2014 16:01:08 EST Ventricular Rate:  82 PR Interval:  138 QRS Duration: 88 QT Interval:  370 QTC Calculation: 432 R Axis:   -17 Text Interpretation:  Normal sinus rhythm Cannot rule out Anterior infarct  , age undetermined Abnormal ECG nonspecific t wave abnormalities not  significantly changed from prior  Confirmed by [facility]  MD, TREY ([id number]) on  12/08/2014 5:57:23 PM      44 yo female with hx of breast cancer presenting with chest pain and shortness of breath.  Shortness of breath has been present to some degree for several months, but chest pain worsened today.  On exam, well appearing, nontoxic, not distressed, normal respiratory effort, normal perfusion, lungs clear to auscultation bilaterally, heart sounds normal with regular rate and rhythm, chest mildly tender to palpation. Workup here was reassuring. Her case was discussed with cardiology was felt she was stable for discharge home with outpatient follow-up.  Clinical Impression: 1. Chest pain       Houston Siren III, MD 12/11/14 [phone]

## 2014-12-13 ENCOUNTER — Other Ambulatory Visit (HOSPITAL_COMMUNITY): Payer: Self-pay | Admitting: Cardiology

## 2014-12-13 DIAGNOSIS — I5022 Chronic systolic (congestive) heart failure: Secondary | ICD-10-CM

## 2014-12-13 NOTE — Telephone Encounter (Signed)
Per [employer]/ medicaid  No pre cert is required  Order placed and [facility] scheduling to arrange for pt

## 2014-12-14 ENCOUNTER — Encounter: Payer: Self-pay | Admitting: Cardiology

## 2014-12-16 ENCOUNTER — Encounter: Payer: Self-pay | Admitting: Internal Medicine

## 2014-12-16 ENCOUNTER — Ambulatory Visit: Payer: Medicaid Other | Attending: Internal Medicine | Admitting: Internal Medicine

## 2014-12-16 VITALS — BP 164/107 | HR 64 | Temp 98.2°F | Resp 18 | Ht 65.0 in | Wt 210.0 lb

## 2014-12-16 DIAGNOSIS — Z86718 Personal history of other venous thrombosis and embolism: Secondary | ICD-10-CM | POA: Insufficient documentation

## 2014-12-16 DIAGNOSIS — H538 Other visual disturbances: Secondary | ICD-10-CM | POA: Diagnosis not present

## 2014-12-16 DIAGNOSIS — G4452 New daily persistent headache (NDPH): Secondary | ICD-10-CM | POA: Insufficient documentation

## 2014-12-16 DIAGNOSIS — Z7901 Long term (current) use of anticoagulants: Secondary | ICD-10-CM | POA: Insufficient documentation

## 2014-12-16 DIAGNOSIS — F331 Major depressive disorder, recurrent, moderate: Secondary | ICD-10-CM | POA: Insufficient documentation

## 2014-12-16 DIAGNOSIS — D509 Iron deficiency anemia, unspecified: Secondary | ICD-10-CM | POA: Diagnosis not present

## 2014-12-16 DIAGNOSIS — I10 Essential (primary) hypertension: Secondary | ICD-10-CM | POA: Diagnosis not present

## 2014-12-16 DIAGNOSIS — Z170 Estrogen receptor positive status [ER+]: Secondary | ICD-10-CM | POA: Insufficient documentation

## 2014-12-16 DIAGNOSIS — C50912 Malignant neoplasm of unspecified site of left female breast: Secondary | ICD-10-CM

## 2014-12-16 MED ORDER — HYDRALAZINE HCL 50 MG PO TABS: 50.0000 mg | ORAL_TABLET | Freq: Three times a day (TID) | ORAL | Status: DC

## 2014-12-16 MED ORDER — LISINOPRIL 40 MG PO TABS: 40.0000 mg | ORAL_TABLET | Freq: Every day | ORAL | Status: DC

## 2014-12-16 MED ORDER — HYDROCHLOROTHIAZIDE 12.5 MG PO CAPS: 12.5000 mg | ORAL_CAPSULE | Freq: Every day | ORAL | Status: DC

## 2014-12-16 MED ORDER — DULOXETINE HCL 30 MG PO CPEP: 30.0000 mg | ORAL_CAPSULE | Freq: Every day | ORAL | Status: DC

## 2014-12-16 MED ORDER — CARVEDILOL 12.5 MG PO TABS: 12.5000 mg | ORAL_TABLET | Freq: Two times a day (BID) | ORAL | Status: DC

## 2014-12-16 NOTE — Progress Notes (Signed)
Patient ID: Tamara [name], female   DOB: 1971/06/14, 44 y.o.   MRN: [id number]   Tamara [name], is a 44 y.o. female  WPV:[id number]  ZSM:[id number]  DOB - [birth year]  Chief Complaint  Patient presents with  . Hospitalization Follow-up  . Chest Pain  . Hypertension        Subjective:   Tamara [name] is a 44 y.o. female here today for a follow up visit. Patient has history of hypertension, breast cancer on chemotherapy, right IJ subclavian DVT on Lovenox, came to the clinic today with major complaint of persistent headache and blurry vision. She was recently seen in the ED for chest pain and pressure, she had a CTA which showed no acute findings and delta troponin is which were negative, patient was discharged to be followed up in the outpatient clinic today. She continues to have headache, described as global, with blurry vision. Patient's blood pressure has been uncontrolled with current medications, but patient is attributing this to her pain and headache instead of the other way around. She is very emotional during this visit, patient is depressed but she denies any suicidal ideation or thoughts. She would like to try antidepressant. Patient has No abdominal pain - No Nausea, No new weakness tingling or numbness, No Cough - SOB.  Problem  New Daily Persistent Headache  Essential Hypertension, Benign  Moderate Recurrent Major Depression    ALLERGIES: Allergies  Allergen Reactions  . Compazine [Prochlorperazine Edisylate] Other (See Comments)    Stuttering    PAST MEDICAL HISTORY: Past Medical History  Diagnosis Date  . Endometriosis   . Hypertension   . Rash     fine rash on abd  . Anemia   . Wears glasses   . 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 Iron deficiency anemia, unspecified 02/25/2014  . BRCA1 positive     c.190T>G (p.Cys64Gly) @ [facility]   . Shortness of breath      when Hemoglobin low  . History of blood transfusion   . DVT (deep venous thrombosis)     Right  Subclavian and IJ.    MEDICATIONS AT HOME: Prior to Admission medications   Medication Sig Start Date End Date Taking? Authorizing Provider  carvedilol (COREG) 12.5 MG tablet Take 1 tablet (12.5 mg total) by mouth 2 (two) times daily with a meal. 12/16/14  Yes Jalecia Leon E Doreene Burke, MD  enoxaparin (LOVENOX) 100 MG/ML injection Inject 0.95 mLs (95 mg total) into the skin every 12 (twelve) hours. Patient taking differently: Inject 95 mg into the skin daily.  09/07/14  Yes [name], MD  hydrALAZINE (APRESOLINE) 50 MG tablet Take 1 tablet (50 mg total) by mouth every 8 (eight) hours. 12/16/14  Yes Tresa Garter, MD  ipratropium (ATROVENT) 0.03 % nasal spray Place 2 sprays into the nose 2 (two) times daily. PRN congestion 09/01/14  Yes Hannah Muthersbaugh, PA-C  lisinopril (PRINIVIL,ZESTRIL) 40 MG tablet Take 1 tablet (40 mg total) by mouth daily. 12/16/14  Yes Kedric Bumgarner Essie Christine, MD  LORazepam (ATIVAN) 0.5 MG tablet Take 1 tablet (0.5 mg total) by mouth every 6 (six) hours as needed (Nausea or vomiting). 07/05/14  Yes Lennis Marion Downer, MD  oxyCODONE (OXY IR/ROXICODONE) 5 MG immediate release tablet Take 1 tablet (5 mg total) by mouth every 4 (four) hours as needed for severe pain. 09/10/14  Yes Marcelino Duster, NP  acetaminophen (TYLENOL) 325 MG tablet Take 2 tablets (650 mg total) by mouth every 6 (six) hours  as needed for mild pain (or Temp > 100). Patient not taking: Reported on 12/04/2014 07/30/14   Shawn Rayburn, PA-C  cyclobenzaprine (FLEXERIL) 10 MG tablet Take 1 tablet (10 mg total) by mouth 3 (three) times daily as needed for muscle spasms. Patient not taking: Reported on 12/04/2014 07/31/14   Shawn Rayburn, PA-C  diazepam (VALIUM) 2 MG tablet Take 1 tablet (2 mg total) by mouth every 6 (six) hours as needed for anxiety or muscle spasms. Patient not taking: Reported on 12/04/2014 09/10/14   Marcelino Duster, NP  DULoxetine (CYMBALTA) 30 MG capsule Take 1 capsule (30 mg total) by mouth daily.  12/16/14   Tresa Garter, MD  ferrous sulfate 325 (65 FE) MG tablet Take 325 mg by mouth 3 (three) times daily with meals.    Historical Provider, MD  hydrochlorothiazide (MICROZIDE) 12.5 MG capsule Take 1 capsule (12.5 mg total) by mouth daily. 12/16/14   Tresa Garter, MD  HYDROcodone-homatropine (HYCODAN) 5-1.5 MG/5ML syrup Take 5 mLs by mouth every 6 (six) hours as needed for cough. Patient not taking: Reported on 12/04/2014 09/01/14   Jarrett Soho Muthersbaugh, PA-C  ibuprofen (ADVIL,MOTRIN) 800 MG tablet Take 800 mg by mouth every 8 (eight) hours as needed (pain).    Historical Provider, MD  lidocaine-prilocaine (EMLA) cream Apply 1 application topically as needed (For port-a-cath.). Patient not taking: Reported on 12/16/2014 09/07/14   [name], MD  ondansetron (ZOFRAN) 8 MG tablet Take 8 mg by mouth 2 (two) times daily as needed for nausea or vomiting.     Historical Provider, MD     Objective:   Filed Vitals:   12/16/14 1147  BP: 164/107  Pulse: 64  Temp: 98.2 F (36.8 C)  TempSrc: Oral  Resp: 18  Height: 5' 5"  (1.651 m)  Weight: 210 lb (95.255 kg)  SpO2: 99%    Exam General appearance : Awake, alert, not in any distress. Speech Clear. Not toxic looking HEENT: Atraumatic and Normocephalic, pupils equally reactive to light and accomodation Neck: supple, no JVD. No cervical lymphadenopathy.  Chest:Good air entry bilaterally, no added sounds  CVS: S1 S2 regular, no murmurs.  Abdomen: Bowel sounds present, Non tender and not distended with no gaurding, rigidity or rebound. Extremities: B/L Lower Ext shows no edema, both legs are warm to touch Neurology: Awake alert, and oriented X 3, CN II-XII intact, Non focal Skin:No Rash Wounds:N/A  Data Review Lab Results  Component Value Date   HGBA1C 5.0 01/29/2014     Assessment & Plan   1. New daily persistent headache: In view of patient's history of breast cancer and this new persistent headache, I will obtain an  imaging studies to rule out metastatic disease even though this headache may be as a result of uncontrolled hypertension  - MR Brain W Wo Contrast; Future  2. Breast cancer, left Continue current regimen of medication with oncologist  3. Essential hypertension: Uncontrolled  Add - hydrochlorothiazide (MICROZIDE) 12.5 MG capsule; Take 1 capsule (12.5 mg total) by mouth daily.  Dispense: 90 capsule; Refill: 3 Continue- lisinopril (PRINIVIL,ZESTRIL) 40 MG tablet; Take 1 tablet (40 mg total) by mouth daily.  Dispense: 90 tablet; Refill: 3 Continue- carvedilol (COREG) 12.5 MG tablet; Take 1 tablet (12.5 mg total) by mouth 2 (two) times daily with a meal.  Dispense: 60 tablet; Refill: 3 Continue- hydrALAZINE (APRESOLINE) 50 MG tablet; Take 1 tablet (50 mg total) by mouth every 8 (eight) hours.  Dispense: 90 tablet; Refill: 3  4.  Moderate recurrent major depression  - DULoxetine (CYMBALTA) 30 MG capsule; Take 1 capsule (30 mg total) by mouth daily.  Dispense: 30 capsule; Refill: 3 - Patient was offered options including psychiatry referral or walk-in to [facility] - Patient was counseled and offered supportive care  Patient was counseled extensively about nutrition and exercise   Return in about 4 weeks (around 01/13/2015) for Follow up Pain and comorbidities, Follow up HTN.  The patient was given clear instructions to go to ER or return to medical center if symptoms don't improve, worsen or new problems develop. The patient verbalized understanding. The patient was told to call to get lab results if they haven't heard anything in the next week.   This note has been created with Surveyor, quantity. Any transcriptional errors are unintentional.    Angelica Chessman, MD, [place], [place], [place] [facility] [place], [place]   12/16/2014, 12:24 PM

## 2014-12-16 NOTE — Progress Notes (Signed)
Pt comes in today s/p Chest pain,migraine headache with elevated Blood pressure  BP- 164/107 64 99% r/a C/o frequent migraine headache,dizziness with blurred vision Denies n/v or feeling faint States she is scheduled for MRI per Cardiologist for poss heart failure at the end of month BP- 164/107 64 compliant with taking Lisinopril 40 mg tab,Hydralazine 50 mg tab EKG- NSR

## 2014-12-16 NOTE — Patient Instructions (Signed)
DASH Eating Plan DASH stands for "Dietary Approaches to Stop Hypertension." The DASH eating plan is a healthy eating plan that has been shown to reduce high blood pressure (hypertension). Additional health benefits may include reducing the risk of type 2 diabetes mellitus, heart disease, and stroke. The DASH eating plan may also help with weight loss. WHAT DO I NEED TO KNOW ABOUT THE DASH EATING PLAN? For the DASH eating plan, you will follow these general guidelines:  Choose foods with a percent daily value for sodium of less than 5% (as listed on the food label).  Use salt-free seasonings or herbs instead of table salt or sea salt.  Check with your health care provider or pharmacist before using salt substitutes.  Eat lower-sodium products, often labeled as "lower sodium" or "no salt added."  Eat fresh foods.  Eat more vegetables, fruits, and low-fat dairy products.  Choose whole grains. Look for the word "whole" as the first word in the ingredient list.  Choose fish and skinless chicken or turkey more often than red meat. Limit fish, poultry, and meat to 6 oz (170 g) each day.  Limit sweets, desserts, sugars, and sugary drinks.  Choose heart-healthy fats.  Limit cheese to 1 oz (28 g) per day.  Eat more home-cooked food and less restaurant, buffet, and fast food.  Limit fried foods.  Cook foods using methods other than frying.  Limit canned vegetables. If you do use them, rinse them well to decrease the sodium.  When eating at a restaurant, ask that your food be prepared with less salt, or no salt if possible. WHAT FOODS CAN I EAT? Seek help from a dietitian for individual calorie needs. Grains Whole grain or whole wheat bread. Brown rice. Whole grain or whole wheat pasta. Quinoa, bulgur, and whole grain cereals. Low-sodium cereals. Corn or whole wheat flour tortillas. Whole grain cornbread. Whole grain crackers. Low-sodium crackers. Vegetables Fresh or frozen vegetables  (raw, steamed, roasted, or grilled). Low-sodium or reduced-sodium tomato and vegetable juices. Low-sodium or reduced-sodium tomato sauce and paste. Low-sodium or reduced-sodium canned vegetables.  Fruits All fresh, canned (in natural juice), or frozen fruits. Meat and Other Protein Products Ground beef (85% or leaner), grass-fed beef, or beef trimmed of fat. Skinless chicken or turkey. Ground chicken or turkey. Pork trimmed of fat. All fish and seafood. Eggs. Dried beans, peas, or lentils. Unsalted nuts and seeds. Unsalted canned beans. Dairy Low-fat dairy products, such as skim or 1% milk, 2% or reduced-fat cheeses, low-fat ricotta or cottage cheese, or plain low-fat yogurt. Low-sodium or reduced-sodium cheeses. Fats and Oils Tub margarines without trans fats. Light or reduced-fat mayonnaise and salad dressings (reduced sodium). Avocado. Safflower, olive, or canola oils. Natural peanut or almond butter. Other Unsalted popcorn and pretzels. The items listed above may not be a complete list of recommended foods or beverages. Contact your dietitian for more options. WHAT FOODS ARE NOT RECOMMENDED? Grains White bread. White pasta. White rice. Refined cornbread. Bagels and croissants. Crackers that contain trans fat. Vegetables Creamed or fried vegetables. Vegetables in a cheese sauce. Regular canned vegetables. Regular canned tomato sauce and paste. Regular tomato and vegetable juices. Fruits Dried fruits. Canned fruit in light or heavy syrup. Fruit juice. Meat and Other Protein Products Fatty cuts of meat. Ribs, chicken wings, bacon, sausage, bologna, salami, chitterlings, fatback, hot dogs, bratwurst, and packaged luncheon meats. Salted nuts and seeds. Canned beans with salt. Dairy Whole or 2% milk, cream, half-and-half, and cream cheese. Whole-fat or sweetened yogurt. Full-fat   cheeses or blue cheese. Nondairy creamers and whipped toppings. Processed cheese, cheese spreads, or cheese  curds. Condiments Onion and garlic salt, seasoned salt, table salt, and sea salt. Canned and packaged gravies. Worcestershire sauce. Tartar sauce. Barbecue sauce. Teriyaki sauce. Soy sauce, including reduced sodium. Steak sauce. Fish sauce. Oyster sauce. Cocktail sauce. Horseradish. Ketchup and mustard. Meat flavorings and tenderizers. Bouillon cubes. Hot sauce. Tabasco sauce. Marinades. Taco seasonings. Relishes. Fats and Oils Butter, stick margarine, lard, shortening, ghee, and bacon fat. Coconut, palm kernel, or palm oils. Regular salad dressings. Other Pickles and olives. Salted popcorn and pretzels. The items listed above may not be a complete list of foods and beverages to avoid. Contact your dietitian for more information. WHERE CAN I FIND MORE INFORMATION? National Heart, Lung, and Blood Institute: travelstabloid.com Document Released: 11/08/2011 Document Revised: 04/05/2014 Document Reviewed: 09/23/2013 [facility] Patient Information 2015 [place], [place]. This information is not intended to replace advice given to you by your health care provider. Make sure you discuss any questions you have with your health care provider. Hypertension Hypertension, commonly called high blood pressure, is when the force of blood pumping through your arteries is too strong. Your arteries are the blood vessels that carry blood from your heart throughout your body. A blood pressure reading consists of a higher number over a lower number, such as 110/72. The higher number (systolic) is the pressure inside your arteries when your heart pumps. The lower number (diastolic) is the pressure inside your arteries when your heart relaxes. Ideally you want your blood pressure below 120/80. Hypertension forces your heart to work harder to pump blood. Your arteries may become narrow or stiff. Having hypertension puts you at risk for heart disease, stroke, and other problems.  RISK  FACTORS Some risk factors for high blood pressure are controllable. Others are not.  Risk factors you cannot control include:   Race. You may be at higher risk if you are African American.  Age. Risk increases with age.  Gender. Men are at higher risk than women before age 37 years. After age 55, women are at higher risk than men. Risk factors you can control include:  Not getting enough exercise or physical activity.  Being overweight.  Getting too much fat, sugar, calories, or salt in your diet.  Drinking too much alcohol. SIGNS AND SYMPTOMS Hypertension does not usually cause signs or symptoms. Extremely high blood pressure (hypertensive crisis) may cause headache, anxiety, shortness of breath, and nosebleed. DIAGNOSIS  To check if you have hypertension, your health care provider will measure your blood pressure while you are seated, with your arm held at the level of your heart. It should be measured at least twice using the same arm. Certain conditions can cause a difference in blood pressure between your right and left arms. A blood pressure reading that is higher than normal on one occasion does not mean that you need treatment. If one blood pressure reading is high, ask your health care provider about having it checked again. TREATMENT  Treating high blood pressure includes making lifestyle changes and possibly taking medicine. Living a healthy lifestyle can help lower high blood pressure. You may need to change some of your habits. Lifestyle changes may include:  Following the DASH diet. This diet is high in fruits, vegetables, and whole grains. It is low in salt, red meat, and added sugars.  Getting at least 2 hours of brisk physical activity every week.  Losing weight if necessary.  Not smoking.  Limiting  alcoholic beverages.  Learning ways to reduce stress. If lifestyle changes are not enough to get your blood pressure under control, your health care provider may  prescribe medicine. You may need to take more than one. Work closely with your health care provider to understand the risks and benefits. HOME CARE INSTRUCTIONS  Have your blood pressure rechecked as directed by your health care provider.   Take medicines only as directed by your health care provider. Follow the directions carefully. Blood pressure medicines must be taken as prescribed. The medicine does not work as well when you skip doses. Skipping doses also puts you at risk for problems.   Do not smoke.   Monitor your blood pressure at home as directed by your health care provider. SEEK MEDICAL CARE IF:   You think you are having a reaction to medicines taken.  You have recurrent headaches or feel dizzy.  You have swelling in your ankles.  You have trouble with your vision. SEEK IMMEDIATE MEDICAL CARE IF:  You develop a severe headache or confusion.  You have unusual weakness, numbness, or feel faint.  You have severe chest or abdominal pain.  You vomit repeatedly.  You have trouble breathing. MAKE SURE YOU:   Understand these instructions.  Will watch your condition.  Will get help right away if you are not doing well or get worse. Document Released: 11/19/2005 Document Revised: 04/05/2014 Document Reviewed: 09/11/2013 [facility] Patient Information 2015 [place], [place]. This information is not intended to replace advice given to you by your health care provider. Make sure you discuss any questions you have with your health care provider. General Headache Without Cause A headache is pain or discomfort felt around the head or neck area. The specific cause of a headache may not be found. There are many causes and types of headaches. A few common ones are:  Tension headaches.  Migraine headaches.  Cluster headaches.  Chronic daily headaches. HOME CARE INSTRUCTIONS   Keep all follow-up appointments with your caregiver or any specialist referral.  Only take  over-the-counter or prescription medicines for pain or discomfort as directed by your caregiver.  Lie down in a dark, quiet room when you have a headache.  Keep a headache journal to find out what may trigger your migraine headaches. For example, write down:  What you eat and drink.  How much sleep you get.  Any change to your diet or medicines.  Try massage or other relaxation techniques.  Put ice packs or heat on the head and neck. Use these 3 to 4 times per day for 15 to 20 minutes each time, or as needed.  Limit stress.  Sit up straight, and do not tense your muscles.  Quit smoking if you smoke.  Limit alcohol use.  Decrease the amount of caffeine you drink, or stop drinking caffeine.  Eat and sleep on a regular schedule.  Get 7 to 9 hours of sleep, or as recommended by your caregiver.  Keep lights dim if bright lights bother you and make your headaches worse. SEEK MEDICAL CARE IF:   You have problems with the medicines you were prescribed.  Your medicines are not working.  You have a change from the usual headache.  You have nausea or vomiting. SEEK IMMEDIATE MEDICAL CARE IF:   Your headache becomes severe.  You have a fever.  You have a stiff neck.  You have loss of vision.  You have muscular weakness or loss of muscle control.  You start  losing your balance or have trouble walking.  You feel faint or pass out.  You have severe symptoms that are different from your first symptoms. MAKE SURE YOU:   Understand these instructions.  Will watch your condition.  Will get help right away if you are not doing well or get worse. Document Released: 11/19/2005 Document Revised: 02/11/2012 Document Reviewed: 12/05/2011 [facility] Patient Information 2015 [place], [place]. This information is not intended to replace advice given to you by your health care provider. Make sure you discuss any questions you have with your health care provider.

## 2014-12-21 ENCOUNTER — Ambulatory Visit (HOSPITAL_BASED_OUTPATIENT_CLINIC_OR_DEPARTMENT_OTHER): Payer: Medicaid Other

## 2014-12-21 ENCOUNTER — Other Ambulatory Visit (HOSPITAL_BASED_OUTPATIENT_CLINIC_OR_DEPARTMENT_OTHER): Payer: Medicaid Other

## 2014-12-21 DIAGNOSIS — Z5112 Encounter for antineoplastic immunotherapy: Secondary | ICD-10-CM

## 2014-12-21 DIAGNOSIS — C50412 Malignant neoplasm of upper-outer quadrant of left female breast: Secondary | ICD-10-CM

## 2014-12-21 LAB — COMPREHENSIVE METABOLIC PANEL (CC13)
ALBUMIN: 4.2 g/dL (ref 3.5–5.0)
ALK PHOS: 99 U/L (ref 40–150)
ALT: 17 U/L (ref 0–55)
AST: 15 U/L (ref 5–34)
Anion Gap: 9 mEq/L (ref 3–11)
BUN: 21.7 mg/dL (ref 7.0–26.0)
CALCIUM: 9.5 mg/dL (ref 8.4–10.4)
CO2: 26 meq/L (ref 22–29)
Chloride: 105 mEq/L (ref 98–109)
Creatinine: 1 mg/dL (ref 0.6–1.1)
EGFR: 86 mL/min/{1.73_m2} — AB (ref >90)
Glucose: 89 mg/dl (ref 70–140)
POTASSIUM: 3.6 meq/L (ref 3.5–5.1)
Sodium: 140 mEq/L (ref 136–145)
TOTAL PROTEIN: 7.4 g/dL (ref 6.4–8.3)
Total Bilirubin: 0.36 mg/dL (ref 0.20–1.20)

## 2014-12-21 LAB — CBC WITH DIFFERENTIAL/PLATELET
BASO%: 0.3 % (ref 0.0–2.0)
Basophils Absolute: 0 10*3/uL (ref 0.0–0.1)
EOS%: 3.1 % (ref 0.0–7.0)
Eosinophils Absolute: 0.2 10*3/uL (ref 0.0–0.5)
HCT: 39.6 % (ref 34.8–46.6)
HEMOGLOBIN: 12.9 g/dL (ref 11.6–15.9)
LYMPH%: 34.5 % (ref 14.0–49.7)
MCH: 28.9 pg (ref 25.1–34.0)
MCHC: 32.6 g/dL (ref 31.5–36.0)
MCV: 88.8 fL (ref 79.5–101.0)
MONO#: 0.4 10*3/uL (ref 0.1–0.9)
MONO%: 5.2 % (ref 0.0–14.0)
NEUT#: 4.2 10*3/uL (ref 1.5–6.5)
NEUT%: 56.9 % (ref 38.4–76.8)
Platelets: 304 10*3/uL (ref 145–400)
RBC: 4.46 10*6/uL (ref 3.70–5.45)
RDW: 12.8 % (ref 11.2–14.5)
WBC: 7.3 10*3/uL (ref 3.9–10.3)
lymph#: 2.5 10*3/uL (ref 0.9–3.3)

## 2014-12-21 MED ORDER — DIPHENHYDRAMINE HCL 25 MG PO CAPS
25.0000 mg | ORAL_CAPSULE | Freq: Once | ORAL | Status: AC
  Administered 2014-12-21: 25 mg via ORAL

## 2014-12-21 MED ORDER — DIPHENHYDRAMINE HCL 25 MG PO CAPS
ORAL_CAPSULE | ORAL | Status: AC
  Filled 2014-12-21: qty 1

## 2014-12-21 MED ORDER — SODIUM CHLORIDE 0.9 % IV SOLN
6.0000 mg/kg | Freq: Once | INTRAVENOUS | Status: AC
  Administered 2014-12-21: 567 mg via INTRAVENOUS
  Filled 2014-12-21: qty 27

## 2014-12-21 MED ORDER — SODIUM CHLORIDE 0.9 % IV SOLN
Freq: Once | INTRAVENOUS | Status: AC
  Administered 2014-12-21: 12:00:00 via INTRAVENOUS

## 2014-12-21 MED ORDER — ACETAMINOPHEN 325 MG PO TABS
650.0000 mg | ORAL_TABLET | Freq: Once | ORAL | Status: AC
  Administered 2014-12-21: 650 mg via ORAL

## 2014-12-21 MED ORDER — ACETAMINOPHEN 325 MG PO TABS
ORAL_TABLET | ORAL | Status: AC
  Filled 2014-12-21: qty 2

## 2014-12-21 NOTE — Patient Instructions (Signed)
[facility] Discharge Instructions for Patients  Today you received the following: Herceptin.  To help prevent nausea and vomiting after your treatment, we encourage you to take your nausea medication as prescribed.   If you develop nausea and vomiting that is not controlled by your nausea medication, call the clinic.   BELOW ARE SYMPTOMS THAT SHOULD BE REPORTED IMMEDIATELY:  *FEVER GREATER THAN 100.5 F  *CHILLS WITH OR WITHOUT FEVER  NAUSEA AND VOMITING THAT IS NOT CONTROLLED WITH YOUR NAUSEA MEDICATION  *UNUSUAL SHORTNESS OF BREATH  *UNUSUAL BRUISING OR BLEEDING  TENDERNESS IN MOUTH AND THROAT WITH OR WITHOUT PRESENCE OF ULCERS  *URINARY PROBLEMS  *BOWEL PROBLEMS  UNUSUAL RASH Items with * indicate a potential emergency and should be followed up as soon as possible.  Feel free to call the clinic you have any questions or concerns. The clinic phone number is [phone].

## 2014-12-29 ENCOUNTER — Ambulatory Visit (HOSPITAL_COMMUNITY)
Admission: RE | Admit: 2014-12-29 | Discharge: 2014-12-29 | Disposition: A | Discharge status: Home / Self Care | Payer: Medicaid Other | Source: Ambulatory Visit | Attending: Internal Medicine | Admitting: Internal Medicine

## 2014-12-29 ENCOUNTER — Ambulatory Visit (HOSPITAL_COMMUNITY)
Admission: RE | Admit: 2014-12-29 | Discharge: 2014-12-29 | Disposition: A | Discharge status: Home / Self Care | Payer: Medicaid Other | Source: Ambulatory Visit | Attending: Cardiology | Admitting: Cardiology

## 2014-12-29 DIAGNOSIS — C50919 Malignant neoplasm of unspecified site of unspecified female breast: Secondary | ICD-10-CM | POA: Diagnosis not present

## 2014-12-29 DIAGNOSIS — G43909 Migraine, unspecified, not intractable, without status migrainosus: Secondary | ICD-10-CM | POA: Insufficient documentation

## 2014-12-29 DIAGNOSIS — I5022 Chronic systolic (congestive) heart failure: Secondary | ICD-10-CM

## 2014-12-29 DIAGNOSIS — G4452 New daily persistent headache (NDPH): Secondary | ICD-10-CM

## 2014-12-29 MED ORDER — GADOBENATE DIMEGLUMINE 529 MG/ML IV SOLN
20.0000 mL | Freq: Once | INTRAVENOUS | Status: AC
  Administered 2014-12-29: 30 mL via INTRAVENOUS

## 2014-12-30 ENCOUNTER — Ambulatory Visit (HOSPITAL_COMMUNITY): Admission: RE | Admit: 2014-12-30 | Payer: Medicaid Other | Source: Ambulatory Visit

## 2015-01-09 ENCOUNTER — Encounter (HOSPITAL_COMMUNITY): Payer: Self-pay | Admitting: *Deleted

## 2015-01-09 ENCOUNTER — Emergency Department (HOSPITAL_COMMUNITY)
Admission: EM | Admit: 2015-01-09 | Discharge: 2015-01-09 | Disposition: A | Discharge status: Home / Self Care | Payer: Medicaid Other | Attending: Emergency Medicine | Admitting: Emergency Medicine

## 2015-01-09 DIAGNOSIS — Z3202 Encounter for pregnancy test, result negative: Secondary | ICD-10-CM | POA: Insufficient documentation

## 2015-01-09 DIAGNOSIS — Z79899 Other long term (current) drug therapy: Secondary | ICD-10-CM | POA: Diagnosis not present

## 2015-01-09 DIAGNOSIS — N938 Other specified abnormal uterine and vaginal bleeding: Secondary | ICD-10-CM | POA: Insufficient documentation

## 2015-01-09 DIAGNOSIS — I10 Essential (primary) hypertension: Secondary | ICD-10-CM | POA: Insufficient documentation

## 2015-01-09 DIAGNOSIS — C50919 Malignant neoplasm of unspecified site of unspecified female breast: Secondary | ICD-10-CM | POA: Insufficient documentation

## 2015-01-09 DIAGNOSIS — D649 Anemia, unspecified: Secondary | ICD-10-CM | POA: Diagnosis not present

## 2015-01-09 DIAGNOSIS — Z86718 Personal history of other venous thrombosis and embolism: Secondary | ICD-10-CM | POA: Diagnosis not present

## 2015-01-09 DIAGNOSIS — Z9889 Other specified postprocedural states: Secondary | ICD-10-CM | POA: Insufficient documentation

## 2015-01-09 DIAGNOSIS — Z7901 Long term (current) use of anticoagulants: Secondary | ICD-10-CM

## 2015-01-09 DIAGNOSIS — N939 Abnormal uterine and vaginal bleeding, unspecified: Secondary | ICD-10-CM | POA: Diagnosis present

## 2015-01-09 DIAGNOSIS — Z87891 Personal history of nicotine dependence: Secondary | ICD-10-CM | POA: Diagnosis not present

## 2015-01-09 LAB — WET PREP, GENITAL
CLUE CELLS WET PREP: NONE SEEN
Trich, Wet Prep: NONE SEEN
Yeast Wet Prep HPF POC: NONE SEEN

## 2015-01-09 LAB — CBC WITH DIFFERENTIAL/PLATELET
Basophils Absolute: 0 10*3/uL (ref 0.0–0.1)
Basophils Relative: 0 % (ref 0–1)
EOS PCT: 2 % (ref 0–5)
Eosinophils Absolute: 0.2 10*3/uL (ref 0.0–0.7)
HEMATOCRIT: 29.9 % — AB (ref 36.0–46.0)
Hemoglobin: 9.8 g/dL — ABNORMAL LOW (ref 12.0–15.0)
LYMPHS ABS: 3 10*3/uL (ref 0.7–4.0)
LYMPHS PCT: 29 % (ref 12–46)
MCH: 29.3 pg (ref 26.0–34.0)
MCHC: 32.8 g/dL (ref 30.0–36.0)
MCV: 89.5 fL (ref 78.0–100.0)
Monocytes Absolute: 0.6 10*3/uL (ref 0.1–1.0)
Monocytes Relative: 6 % (ref 3–12)
NEUTROS ABS: 6.4 10*3/uL (ref 1.7–7.7)
NEUTROS PCT: 63 % (ref 43–77)
Platelets: 339 10*3/uL (ref 150–400)
RBC: 3.34 MIL/uL — AB (ref 3.87–5.11)
RDW: 12.4 % (ref 11.5–15.5)
WBC: 10.2 10*3/uL (ref 4.0–10.5)

## 2015-01-09 LAB — URINALYSIS, ROUTINE W REFLEX MICROSCOPIC
GLUCOSE, UA: NEGATIVE mg/dL
KETONES UR: NEGATIVE mg/dL
NITRITE: NEGATIVE
Protein, ur: 100 mg/dL — AB
SPECIFIC GRAVITY, URINE: 1.025 (ref 1.005–1.030)
Urobilinogen, UA: 0.2 mg/dL (ref 0.0–1.0)
pH: 5 (ref 5.0–8.0)

## 2015-01-09 LAB — PROTIME-INR
INR: 1.08 (ref 0.00–1.49)
PROTHROMBIN TIME: 14.1 s (ref 11.6–15.2)

## 2015-01-09 LAB — COMPREHENSIVE METABOLIC PANEL
ALT: 16 U/L (ref 0–35)
AST: 15 U/L (ref 0–37)
Albumin: 4.1 g/dL (ref 3.5–5.2)
Alkaline Phosphatase: 88 U/L (ref 39–117)
Anion gap: 7 (ref 5–15)
BILIRUBIN TOTAL: 0.3 mg/dL (ref 0.3–1.2)
BUN: 22 mg/dL (ref 6–23)
CO2: 28 mmol/L (ref 19–32)
Calcium: 8.9 mg/dL (ref 8.4–10.5)
Chloride: 105 mmol/L (ref 96–112)
Creatinine, Ser: 1.04 mg/dL (ref 0.50–1.10)
GFR calc Af Amer: 76 mL/min — ABNORMAL LOW (ref >90)
GFR calc non Af Amer: 66 mL/min — ABNORMAL LOW (ref >90)
Glucose, Bld: 87 mg/dL (ref 70–99)
POTASSIUM: 3.4 mmol/L — AB (ref 3.5–5.1)
Sodium: 140 mmol/L (ref 135–145)
Total Protein: 7 g/dL (ref 6.0–8.3)

## 2015-01-09 LAB — URINE MICROSCOPIC-ADD ON

## 2015-01-09 LAB — POC URINE PREG, ED: Preg Test, Ur: NEGATIVE

## 2015-01-09 NOTE — ED Provider Notes (Signed)
CSN: [id number]     Arrival date & time 01/09/15  38 History   First MD Initiated Contact with Patient 01/09/15 1503     Chief Complaint  Patient presents with  . ca pt, vaginal bleeding      (Consider location/radiation/quality/duration/timing/severity/associated sxs/prior Treatment) HPI The patient has a history of breast cancer currently undergoing chemotherapy. She reports that she has not had a menstrual cycle in almost a year. She reports that she began bleeding on February 4 and has been going through thick pads every 30 minutes. She reports she's had large clots passed. She pushes been some lower abdominal cramping but not severe pain. She denies lightheadedness or dizziness with position change. She reports prior to developing breast cancer and undergoing chemotherapy she did have heavy menstrual cycles however not like this. She reports that she is also currently getting Lovenox injections twice a day. Past Medical History  Diagnosis Date  . Endometriosis   . Hypertension   . Rash     fine rash on abd  . Anemia   . Wears glasses   . Iron deficiency anemia, unspecified 02/25/2014  . BRCA1 positive     c.190T>G (p.Cys64Gly) @ [facility]   . Shortness of breath      when Hemoglobin low  . History of blood transfusion   . DVT (deep venous thrombosis)     Right Subclavian and IJ.  . Breast cancer 02/01/14    ER-/PR-/Her2+, still receiving chemo 12/21/14   Past Surgical History  Procedure Laterality Date  . Cervical polypectomy  2010  . Cesarean section      one previous  . Tubal ligation    . Portacath placement Right 02/23/2014    Procedure: INSERTION PORT-A-CATH;  Surgeon: Joyice Faster. Cornett, MD;  Location: [place];  Service: General;  Laterality: Right;  . Port-a-cath removal N/A 05/21/2014    Procedure: REMOVAL PORT-A-CATH;  Surgeon: Zenovia Jarred, MD;  Location: [place];  Service: General;  Laterality: N/A;  . Tee without cardioversion N/A 05/26/2014     Procedure: TRANSESOPHAGEAL ECHOCARDIOGRAM (TEE);  Surgeon: Larey Dresser, MD;  Location: [facility] ENDOSCOPY;  Service: Cardiovascular;  Laterality: N/A;  . Bilateral total mastectomy with axillary lymph node dissection Bilateral 07/28/2014    Procedure: BILATERAL TOTAL MASTECTOMY WITH LEFT AXILLARY SENTINEL LYMPH NODE BIOPSY;  Surgeon: Stark Klein, MD;  Location: [place];  Service: General;  Laterality: Bilateral;  . Breast reconstruction with placement of tissue expander and flex hd (acellular hydrated dermis) Bilateral 07/28/2014    Procedure: BILATERAL BREAST RECONSTRUCTION WITH PLACEMENT OF TISSUE EXPANDER AND FLEX HD (ACELLULAR HYDRATED DERMIS);  Surgeon: Theodoro Kos, DO;  Location: [place];  Service: Plastics;  Laterality: Bilateral;  . Removal of tissue expander and placement of implant Bilateral 10/14/2014    Procedure: REMOVAL OF BILATERAL BREAST  TISSUE EXPANDER  WITH PLASCEMENT OF BILATERAL  BREAST IMPLANTS;  Surgeon: Theodoro Kos, DO;  Location: [place];  Service: Plastics;  Laterality: Bilateral;  . Left heart catheterization with coronary angiogram N/A 09/13/2014    Procedure: LEFT HEART CATHETERIZATION WITH CORONARY ANGIOGRAM;  Surgeon: Jolaine Artist, MD;  Location[facility] CATH LAB;  Service: Cardiovascular;  Laterality: N/A;   Family History  Problem Relation Age of Onset  . Breast cancer Mother 43    currently 73  . Diabetes Father   . Pancreatic cancer Father 8  . Breast cancer Paternal Aunt 71    currently 37; BRCA1 positive  . Stroke  Maternal Grandfather   . Cancer Paternal Aunt     unk. primary; deceased 74s  . Breast cancer Cousin     daughter of unaffected paternal aunt; dx in her 74s   History  Substance Use Topics  . Smoking status: Former Smoker -- 0.25 packs/day for 15 years    Quit date: 02/18/2009  . Smokeless tobacco: Former Systems developer  . Alcohol Use: Yes     Comment: occasional   OB History    Gravida Para Term Preterm AB TAB SAB Ectopic  Multiple Living   _0 Review of Systems 10 Systems reviewed and are negative for acute change except as noted in the HPI.    Allergies  Compazine  Home Medications   Prior to Admission medications   Medication Sig Start Date End Date Taking? Authorizing Provider  acetaminophen (TYLENOL) 325 MG tablet Take 2 tablets (650 mg total) by mouth every 6 (six) hours as needed for mild pain (or Temp > 100). 07/30/14  Yes Shawn Rayburn, PA-C  carvedilol (COREG) 12.5 MG tablet Take 1 tablet (12.5 mg total) by mouth 2 (two) times daily with a meal. 12/16/14  Yes Olugbemiga E Doreene Burke, MD  cyclobenzaprine (FLEXERIL) 10 MG tablet Take 1 tablet (10 mg total) by mouth 3 (three) times daily as needed for muscle spasms. 07/31/14  Yes Shawn Rayburn, PA-C  diazepam (VALIUM) 2 MG tablet Take 1 tablet (2 mg total) by mouth every 6 (six) hours as needed for anxiety or muscle spasms. 09/10/14  Yes Marcelino Duster, NP  DULoxetine (CYMBALTA) 30 MG capsule Take 1 capsule (30 mg total) by mouth daily. 12/16/14  Yes Olugbemiga Essie Christine, MD  enoxaparin (LOVENOX) 100 MG/ML injection Inject 0.95 mLs (95 mg total) into the skin every 12 (twelve) hours. Patient taking differently: Inject 95 mg into the skin daily.  09/07/14  Yes [name], MD  ferrous sulfate 325 (65 FE) MG tablet Take 325 mg by mouth 3 (three) times daily with meals.   Yes Historical Provider, MD  hydrALAZINE (APRESOLINE) 50 MG tablet Take 1 tablet (50 mg total) by mouth every 8 (eight) hours. 12/16/14  Yes Tresa Garter, MD  hydrochlorothiazide (MICROZIDE) 12.5 MG capsule Take 1 capsule (12.5 mg total) by mouth daily. 12/16/14  Yes Tresa Garter, MD  ibuprofen (ADVIL,MOTRIN) 800 MG tablet Take 800 mg by mouth every 8 (eight) hours as needed (pain).   Yes Historical Provider, MD  ipratropium (ATROVENT) 0.03 % nasal spray Place 2 sprays into the nose 2 (two) times daily. PRN congestion 09/01/14  Yes Hannah Muthersbaugh, PA-C   lidocaine-prilocaine (EMLA) cream Apply 1 application topically as needed (For port-a-cath.). Patient taking differently: Apply 1 application topically 2 (two) times daily. Uses before administering lovenox shot to. 09/07/14  Yes [name], MD  lisinopril (PRINIVIL,ZESTRIL) 40 MG tablet Take 1 tablet (40 mg total) by mouth daily. 12/16/14  Yes Olugbemiga Essie Christine, MD  LORazepam (ATIVAN) 0.5 MG tablet Take 1 tablet (0.5 mg total) by mouth every 6 (six) hours as needed (Nausea or vomiting). 07/05/14  Yes Lennis Marion Downer, MD  ondansetron (ZOFRAN) 8 MG tablet Take 8 mg by mouth 2 (two) times daily as needed for nausea or vomiting.    Yes Historical Provider, MD  oxyCODONE (OXY IR/ROXICODONE) 5 MG immediate release tablet Take 1 tablet (5 mg total) by mouth every 4 (four) hours as needed for severe pain. 09/10/14  Yes Marcelino Duster, NP  PRESCRIPTION MEDICATION Chemo at [facility]   Yes Historical Provider, MD  HYDROcodone-homatropine [facility]) 5-1.5 MG/5ML syrup Take 5 mLs by mouth every 6 (six) hours as needed for cough. Patient not taking: Reported on 12/04/2014 09/01/14   Hannah Muthersbaugh, PA-C   BP 101/55 mmHg  Pulse 66  Temp(Src) 98.2 F (36.8 C) (Oral)  Resp 16  Wt 210 lb (95.255 kg)  SpO2 100% Physical Exam  Constitutional: She is oriented to person, place, and time. She appears well-developed and well-nourished.  HENT:  Head: Normocephalic and atraumatic.  Eyes: EOM are normal. Pupils are equal, round, and reactive to light.  Neck: Neck supple.  Cardiovascular: Normal rate, regular rhythm, normal heart sounds and intact distal pulses.   Pulmonary/Chest: Effort normal and breath sounds normal.  Abdominal: Soft. Bowel sounds are normal. She exhibits no distension. There is no tenderness.  Genitourinary:  This patient examination shows a small pool of blood in the vaginal vault. There is no clot present. There is no clot retained within the cervical os. There is a slow trickle of blood  from the os that is not filling the vault briskly. Bimanual examination shows some diffuse uterine tenderness.  Musculoskeletal: Normal range of motion. She exhibits no edema.  Neurological: She is alert and oriented to person, place, and time. She has normal strength. Coordination normal. GCS eye subscore is 4. GCS verbal subscore is 5. GCS motor subscore is 6.  Skin: Skin is warm, dry and intact.  Psychiatric: She has a normal mood and affect.    ED Course  Procedures (including critical care time) Labs Review Labs Reviewed  WET PREP, GENITAL - Abnormal; Notable for the following:    WBC, Wet Prep HPF POC FEW (*)    All other components within normal limits  COMPREHENSIVE METABOLIC PANEL - Abnormal; Notable for the following:    Potassium 3.4 (*)    GFR calc non Af Amer 66 (*)    GFR calc Af Amer 76 (*)    All other components within normal limits  CBC WITH DIFFERENTIAL/PLATELET - Abnormal; Notable for the following:    RBC 3.34 (*)    Hemoglobin 9.8 (*)    HCT 29.9 (*)    All other components within normal limits  URINALYSIS, ROUTINE W REFLEX MICROSCOPIC - Abnormal; Notable for the following:    Color, Urine RED (*)    APPearance CLOUDY (*)    Hgb urine dipstick LARGE (*)    Bilirubin Urine SMALL (*)    Protein, ur 100 (*)    Leukocytes, UA SMALL (*)    All other components within normal limits  URINE MICROSCOPIC-ADD ON - Abnormal; Notable for the following:    Bacteria, UA FEW (*)    All other components within normal limits  PROTIME-INR  POC URINE PREG, ED  GC/CHLAMYDIA PROBE AMP ([place])    Imaging Review No results found.   EKG Interpretation None     Consult: [name] case was reviewed with Dr. Inda Merlin. We did review the patient's history present illness and past medical history. At this time he suggests the patient hold her Lovenox doses and contact Dr. Osker Mason tomorrow morning to discuss her ongoing anticoagulation needs in the face of vaginal  bleeding. MDM   Final diagnoses:  Dysfunctional uterine bleeding  Anticoagulated by anticoagulation treatment  Breast cancer, unspecified laterality   The patient presents vaginal bleeding is on above. At this point time by examination there is  no brisk bleeding present. The patient is not orthostatic and not currently in need of transfusion. She does report that the bleeding seems to be slowing down relative to earlier today and yesterday. At this time per the recommendations of Dr. Inda Merlin, the patient will hold her Lovenox and contact her oncologist for ongoing anticoagulation guidance. The patient is aware of the need to return if any advancing or worsening symptoms. The on-call gynecologist at the [facility] was contacted however at this time does not have other suggestion for management of bleeding other than concurring with the oncologist's recommendation at this time.     Charlesetta Shanks, MD 01/09/15 [phone]

## 2015-01-09 NOTE — ED Notes (Signed)
Md Pfeiffer At bedside

## 2015-01-09 NOTE — ED Notes (Signed)
Pt reports currently being treated for breast cancer, last chemo 1/19. Pt reports that menstrual cycle has been extremely heavy, started on 2/4, pt wearing 3 pads at a time and changing them ever 30 minutes. Pt called cancer center and was told to come to ED. Vaginal pain/cramping 8/10.

## 2015-01-09 NOTE — Discharge Instructions (Signed)
Abnormal Uterine Bleeding DO NO TAKE TONIGHT'S OR TOMORROW MORNING'S LOVENOX DOSE. CALL YOUR ONCOLOGIST IN THE MORNING TO DISCUSS THE ONGOING USE OF YOUR BLOOD THINNERS AND VAGINAL BLEEDING. YOU NEED A RECHECK WITH YOUR GYNECOLOGIST AS WELL. CALL IN THE MORNING. RETURN TO THE EMERGENCY DEPARTMENT IF YOU ARE WORSENING OR DEVELOPING CONCERNING SYMPTOMS.   Abnormal uterine bleeding can affect women at various stages in life, including teenagers, women in their reproductive years, pregnant women, and women who have reached menopause. Several kinds of uterine bleeding are considered abnormal, including:  Bleeding or spotting between periods.   Bleeding after sexual intercourse.   Bleeding that is heavier or more than normal.   Periods that last longer than usual.  Bleeding after menopause.  Many cases of abnormal uterine bleeding are minor and simple to treat, while others are more serious. Any type of abnormal bleeding should be evaluated by your health care provider. Treatment will depend on the cause of the bleeding. HOME CARE INSTRUCTIONS Monitor your condition for any changes. The following actions may help to alleviate any discomfort you are experiencing:  Avoid the use of tampons and douches as directed by your health care provider.  Change your pads frequently. You should get regular pelvic exams and Pap tests. Keep all follow-up appointments for diagnostic tests as directed by your health care provider.  SEEK MEDICAL CARE IF:   Your bleeding lasts more than 1 week.   You feel dizzy at times.  SEEK IMMEDIATE MEDICAL CARE IF:   You pass out.   You are changing pads every 15 to 30 minutes.   You have abdominal pain.  You have a fever.   You become sweaty or weak.   You are passing large blood clots from the vagina.   You start to feel nauseous and vomit. MAKE SURE YOU:   Understand these instructions.  Will watch your condition.  Will get help right away if  you are not doing well or get worse. Document Released: 11/19/2005 Document Revised: 11/24/2013 Document Reviewed: 06/18/2013 [facility] Patient Information 2015 [place], [place]. This information is not intended to replace advice given to you by your health care provider. Make sure you discuss any questions you have with your health care provider.

## 2015-01-10 ENCOUNTER — Telehealth: Payer: Self-pay | Admitting: *Deleted

## 2015-01-10 LAB — GC/CHLAMYDIA PROBE AMP (~~LOC~~) NOT AT ARMC
CHLAMYDIA, DNA PROBE: NEGATIVE
NEISSERIA GONORRHEA: NEGATIVE

## 2015-01-10 NOTE — Telephone Encounter (Signed)
-----   Message from Tresa Garter, MD sent at 12/29/2014  3:21 PM EST ----- Please inform patient that her brain MRI is negative. Continue current blood pressure medicine for proper control and ibuprofen when necessary for headache.

## 2015-01-10 NOTE — Telephone Encounter (Signed)
VERBAL ORDER AND READ BACK TO HEATHER FERRELL,NP- HOLD LOVENOX UNTIL PT. IS SEEN IN THE OFFICE TOMORROW. NOTIFIED PT. OF INSTRUCTIONS. ALSO INFORMED PT. THAT IF BLEEDING WORSEN, SHE BECOMES DIZZY, SHORT OF BREATH, OR HAS CHEST PAIN TO GO TO THE EMERGENCY DEPARTMENT FOR EVALUATION. PT. VOICES UNDERSTANDING.

## 2015-01-10 NOTE — Telephone Encounter (Signed)
DR.MOHAMED INSTRUCTED PT. TO HOLD LOVENOX LAST NIGHT AND THIS MORNING. PT. TO NOTIFIED THIS OFFICE TODAY. VAGINAL BLEEDING HAS IMPROVED BUT IS STILL HEAVY. PT. IS USING THE OVER NIGHT PADS THREE AT A TIME AND CHANGING EVERY HOUR. PT. HAS AN APPOINTMENT WITH HEATHER FERRELL,NP ON 01/11/15. SPOKE WITH HEATHER. SHE WILL CONFER WITH DR.MAGRINAT.

## 2015-01-10 NOTE — Telephone Encounter (Signed)
Pt is aware of her MRI results 

## 2015-01-11 ENCOUNTER — Other Ambulatory Visit: Payer: Self-pay | Admitting: Oncology

## 2015-01-11 ENCOUNTER — Telehealth: Payer: Self-pay | Admitting: Nurse Practitioner

## 2015-01-11 ENCOUNTER — Other Ambulatory Visit (HOSPITAL_BASED_OUTPATIENT_CLINIC_OR_DEPARTMENT_OTHER): Payer: Medicaid Other

## 2015-01-11 ENCOUNTER — Ambulatory Visit (HOSPITAL_BASED_OUTPATIENT_CLINIC_OR_DEPARTMENT_OTHER): Payer: Medicaid Other | Admitting: Nurse Practitioner

## 2015-01-11 ENCOUNTER — Telehealth: Payer: Self-pay | Admitting: *Deleted

## 2015-01-11 ENCOUNTER — Encounter: Payer: Self-pay | Admitting: Nurse Practitioner

## 2015-01-11 ENCOUNTER — Ambulatory Visit (HOSPITAL_BASED_OUTPATIENT_CLINIC_OR_DEPARTMENT_OTHER): Payer: Medicaid Other

## 2015-01-11 VITALS — BP 98/58 | HR 75 | Temp 98.1°F | Resp 18 | Ht 65.0 in | Wt 210.2 lb

## 2015-01-11 DIAGNOSIS — Z5112 Encounter for antineoplastic immunotherapy: Secondary | ICD-10-CM

## 2015-01-11 DIAGNOSIS — C773 Secondary and unspecified malignant neoplasm of axilla and upper limb lymph nodes: Secondary | ICD-10-CM

## 2015-01-11 DIAGNOSIS — Z170 Estrogen receptor positive status [ER+]: Secondary | ICD-10-CM

## 2015-01-11 DIAGNOSIS — C50412 Malignant neoplasm of upper-outer quadrant of left female breast: Secondary | ICD-10-CM

## 2015-01-11 DIAGNOSIS — I82402 Acute embolism and thrombosis of unspecified deep veins of left lower extremity: Secondary | ICD-10-CM

## 2015-01-11 DIAGNOSIS — N924 Excessive bleeding in the premenopausal period: Secondary | ICD-10-CM

## 2015-01-11 DIAGNOSIS — N920 Excessive and frequent menstruation with regular cycle: Secondary | ICD-10-CM | POA: Insufficient documentation

## 2015-01-11 LAB — CBC WITH DIFFERENTIAL/PLATELET
BASO%: 0.1 % (ref 0.0–2.0)
Basophils Absolute: 0 10*3/uL (ref 0.0–0.1)
EOS%: 1.8 % (ref 0.0–7.0)
Eosinophils Absolute: 0.1 10*3/uL (ref 0.0–0.5)
HCT: 25.5 % — ABNORMAL LOW (ref 34.8–46.6)
HGB: 8.4 g/dL — ABNORMAL LOW (ref 11.6–15.9)
LYMPH%: 31.2 % (ref 14.0–49.7)
MCH: 29.4 pg (ref 25.1–34.0)
MCHC: 32.9 g/dL (ref 31.5–36.0)
MCV: 89.2 fL (ref 79.5–101.0)
MONO#: 0.4 10*3/uL (ref 0.1–0.9)
MONO%: 5.5 % (ref 0.0–14.0)
NEUT%: 61.4 % (ref 38.4–76.8)
NEUTROS ABS: 4.7 10*3/uL (ref 1.5–6.5)
Platelets: 245 10*3/uL (ref 145–400)
RBC: 2.86 10*6/uL — AB (ref 3.70–5.45)
RDW: 12.6 % (ref 11.2–14.5)
WBC: 7.7 10*3/uL (ref 3.9–10.3)
lymph#: 2.4 10*3/uL (ref 0.9–3.3)

## 2015-01-11 LAB — COMPREHENSIVE METABOLIC PANEL (CC13)
ALT: 8 U/L (ref 0–55)
AST: 9 U/L (ref 5–34)
Albumin: 3.4 g/dL — ABNORMAL LOW (ref 3.5–5.0)
Alkaline Phosphatase: 89 U/L (ref 40–150)
Anion Gap: 10 mEq/L (ref 3–11)
BUN: 18.6 mg/dL (ref 7.0–26.0)
CALCIUM: 8.7 mg/dL (ref 8.4–10.4)
CHLORIDE: 105 meq/L (ref 98–109)
CO2: 24 mEq/L (ref 22–29)
Creatinine: 1 mg/dL (ref 0.6–1.1)
EGFR: 77 mL/min/{1.73_m2} — ABNORMAL LOW (ref >90)
Glucose: 104 mg/dl (ref 70–140)
Potassium: 3.4 mEq/L — ABNORMAL LOW (ref 3.5–5.1)
Sodium: 139 mEq/L (ref 136–145)
Total Protein: 6.1 g/dL — ABNORMAL LOW (ref 6.4–8.3)

## 2015-01-11 MED ORDER — SODIUM CHLORIDE 0.9 % IV SOLN
Freq: Once | INTRAVENOUS | Status: AC
  Administered 2015-01-11: 13:00:00 via INTRAVENOUS

## 2015-01-11 MED ORDER — TRASTUZUMAB CHEMO INJECTION 440 MG
6.0000 mg/kg | Freq: Once | INTRAVENOUS | Status: AC
  Administered 2015-01-11: 567 mg via INTRAVENOUS
  Filled 2015-01-11: qty 27

## 2015-01-11 MED ORDER — ACETAMINOPHEN 325 MG PO TABS
ORAL_TABLET | ORAL | Status: AC
Start: 2015-01-11 — End: 2015-01-11
  Filled 2015-01-11: qty 2

## 2015-01-11 MED ORDER — ACETAMINOPHEN 325 MG PO TABS
650.0000 mg | ORAL_TABLET | Freq: Once | ORAL | Status: AC
  Administered 2015-01-11: 650 mg via ORAL

## 2015-01-11 MED ORDER — DIPHENHYDRAMINE HCL 25 MG PO CAPS
ORAL_CAPSULE | ORAL | Status: AC
  Filled 2015-01-11: qty 1

## 2015-01-11 MED ORDER — DIPHENHYDRAMINE HCL 25 MG PO CAPS
25.0000 mg | ORAL_CAPSULE | Freq: Once | ORAL | Status: AC
  Administered 2015-01-11: 25 mg via ORAL

## 2015-01-11 NOTE — Telephone Encounter (Signed)
, °

## 2015-01-11 NOTE — Patient Instructions (Signed)
[facility] Discharge Instructions   Today you received the following: Herceptin.  To help prevent nausea and vomiting after your treatment, we encourage you to take your nausea medication as prescribed.   If you develop nausea and vomiting that is not controlled by your nausea medication, call the clinic.   BELOW ARE SYMPTOMS THAT SHOULD BE REPORTED IMMEDIATELY:  *FEVER GREATER THAN 100.5 F  *CHILLS WITH OR WITHOUT FEVER  NAUSEA AND VOMITING THAT IS NOT CONTROLLED WITH YOUR NAUSEA MEDICATION  *UNUSUAL SHORTNESS OF BREATH  *UNUSUAL BRUISING OR BLEEDING  TENDERNESS IN MOUTH AND THROAT WITH OR WITHOUT PRESENCE OF ULCERS  *URINARY PROBLEMS  *BOWEL PROBLEMS  UNUSUAL RASH Items with * indicate a potential emergency and should be followed up as soon as possible.  Feel free to call the clinic you have any questions or concerns. The clinic phone number is [phone].

## 2015-01-11 NOTE — Telephone Encounter (Signed)
Per staff message and POF I have scheduled appts. Advised scheduler of appts. JMW  

## 2015-01-11 NOTE — Progress Notes (Signed)
ID: Su Grand OB: [birth year]  MR#: [id number]  CSN#:[id number]  PCP: Angelica Chessman, MD GYN:   SU: Dr. Erroll Luna OTHER MD: Dr. 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 There was also an 8 mm mass 8 cm from the nipple in the ipsilateral breast, as well as positive lymph nodes. 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 MRI confirmed 2.8 and 1.3 cm mass is as well as lymph node. On the right a 6 mm nodule, biopsied and negative for malignancy. PET scan 8-82-80 had hypermetabolic left breast mass consistent with known neoplasm and FDG positive left axillary lymph nodes,benign-appearing brown fat activity and muscular activity in the  neck and chest but no findings for metastatic disease involving the neck, chest, abdomen, pelvis or bones. Moderate FDG activity in the endometrial canal is thought likely due to secretory phase of ovulation or menses. No mass, uterine fibroids present. CT [facility] 02-19-14 had 3 cm left breast mass, enlarged left axillary lymph nodes are positive and no CT findings for metastatic disease involving the chest,abdomen or pelvis and no evidence of osseous metastatic disease. Mildly enlarged fibroid uterus."   Her subsequent treatment is as detailed below   INTERVAL HISTORY:   Tamara [name] returns today for follow up of her breast  cancer. She is due for trastuzumab. Her period restarted for the first time in 1 year on 2/4, and while she has been known to have heavy periods, its has never been this bad. She was going through thick pad every 30 minutes for about 3 days with large clots passing. She has some cramping, but nothing severe. She finally visited the ED on 2/7 and had a pelvic exam performed but that was normal. She has been holding her lovenox injections per their instructions since then. The bleeding has slowed down some. She is now changing pads about every hour. Her blood pressure is lower than usual, but she is asymptomatic at this time. She denies headaches, weakness, or dizziness.   REVIEW OF SYSTEMS: Tamara [name] denies fevers, chills, nausea, vomiting, or changes in bowel or bladder habits. She has a good appetite and is staying well hydrated. She has some hot flashes. She denies mouth sores, rashes, or peripheral neuropathy symptoms. She has some shortness of breath with exertion but denies chest pain, cough, palpitations, or fatigue.  A detailed review of systems is otherwise negative.   PAST MEDICAL HISTORY: Past Medical History  Diagnosis Date  . Endometriosis   . Hypertension   . Rash     fine rash on abd  . Anemia   . Wears glasses   . Iron deficiency anemia, unspecified 02/25/2014  . BRCA1 positive     c.190T>G (p.Cys64Gly) @ [facility]   . Shortness of breath      when Hemoglobin low  . History of blood transfusion   . DVT (deep venous thrombosis)     Right Subclavian and IJ.  . Breast cancer  02/01/14    ER-/PR-/Her2+, still receiving chemo 12/21/14    PAST SURGICAL HISTORY: Past Surgical History  Procedure Laterality Date  . Cervical polypectomy  2010  . Cesarean section      one previous  . Tubal ligation    . Portacath placement Right 02/23/2014    Procedure: INSERTION PORT-A-CATH;  Surgeon: Joyice Faster. Cornett, MD;  Location: [facility];  Service: General;  Laterality: Right;  .  Port-a-cath removal N/A 05/21/2014    Procedure: REMOVAL PORT-A-CATH;  Surgeon: Zenovia Jarred, MD;  Location: [place];  Service: General;  Laterality: N/A;  . Tee without cardioversion N/A 05/26/2014    Procedure: TRANSESOPHAGEAL ECHOCARDIOGRAM (TEE);  Surgeon: Larey Dresser, MD;  Location: [facility] ENDOSCOPY;  Service: Cardiovascular;  Laterality: N/A;  . Bilateral total mastectomy with axillary lymph node dissection Bilateral 07/28/2014    Procedure: BILATERAL TOTAL MASTECTOMY WITH LEFT AXILLARY SENTINEL LYMPH NODE BIOPSY;  Surgeon: Stark Klein, MD;  Location: [place];  Service: General;  Laterality: Bilateral;  . Breast reconstruction with placement of tissue expander and flex hd (acellular hydrated dermis) Bilateral 07/28/2014    Procedure: BILATERAL BREAST RECONSTRUCTION WITH PLACEMENT OF TISSUE EXPANDER AND FLEX HD (ACELLULAR HYDRATED DERMIS);  Surgeon: Theodoro Kos, DO;  Location: [place];  Service: Plastics;  Laterality: Bilateral;  . Removal of tissue expander and placement of implant Bilateral 10/14/2014    Procedure: REMOVAL OF BILATERAL BREAST  TISSUE EXPANDER  WITH PLASCEMENT OF BILATERAL  BREAST IMPLANTS;  Surgeon: Theodoro Kos, DO;  Location: [place];  Service: Plastics;  Laterality: Bilateral;  . Left heart catheterization with coronary angiogram N/A 09/13/2014    Procedure: LEFT HEART CATHETERIZATION WITH CORONARY ANGIOGRAM;  Surgeon: Jolaine Artist, MD;  Location: [facility] CATH LAB;  Service: Cardiovascular;  Laterality: N/A;    FAMILY HISTORY Family History  Problem Relation Age of Onset  . Breast cancer Mother 37    currently 65  . Diabetes Father   . Pancreatic cancer Father 58  . Breast cancer Paternal Aunt 58    currently 29; BRCA1 positive  . Stroke Maternal Grandfather   . Cancer Paternal Aunt     unk. primary; deceased 42s  . Breast cancer Cousin     daughter of unaffected paternal aunt; dx in her 35s  [name] father died from prostate cancer the  age of 15. [name] mother was diagnosed with breast cancer the age of 81. [name] father had 5 sisters, 3 of whom were diagnosed with breast cancer, 2 of them before the age of 29. The patient had one brother, no sisters. There is no history of ovarian cancer in the family.  GYNECOLOGIC HISTORY:  Menarche age 45, first live birth age 88, the patient is Tamara [name] P5. Her periods stopped at the time of chemotherapy. 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 Her husband Tamara [name] since works as an Animal nutritionist. [name] oldest child, a son, Tamara [name], is studying Therapist, occupational; the patient is a 69 year old daughter Tamara [name] is also in college. [name] younger children are 61, 24, and 60. The patient attends a Arboricult[employer]ist church   ADVANCED DIRECTIVES: Not in place   HEALTH MAINTENANCE: History  Substance Use Topics  . Smoking status: Former Smoker -- 0.25 packs/day for 15 years    Quit date: 02/18/2009  . Smokeless tobacco: Former Systems developer  . Alcohol Use: Yes  Comment: occasional   Mammogram: Colonoscopy: Bone Density Scan:  Pap Smear:  Eye Exam:  Vitamin D Level:   Lipid Panel:    Allergies  Allergen Reactions  . Compazine [Prochlorperazine Edisylate] Other (See Comments)    Stuttering    Current Outpatient Prescriptions  Medication Sig Dispense Refill  . carvedilol (COREG) 12.5 MG tablet Take 1 tablet (12.5 mg total) by mouth 2 (two) times daily with a meal. 60 tablet 3  . DULoxetine (CYMBALTA) 30 MG capsule Take 1 capsule (30 mg total) by mouth daily. 30 capsule 3  . hydrALAZINE (APRESOLINE) 50 MG tablet Take 1 tablet (50 mg total) by mouth every 8 (eight) hours. 90 tablet 3  . hydrochlorothiazide (MICROZIDE) 12.5 MG capsule Take 1 capsule (12.5 mg total) by mouth daily. 90 capsule 3  . lisinopril (PRINIVIL,ZESTRIL) 40 MG tablet Take 1 tablet (40 mg total) by mouth daily. 90 tablet 3  .  oxyCODONE (OXY IR/ROXICODONE) 5 MG immediate release tablet Take 1 tablet (5 mg total) by mouth every 4 (four) hours as needed for severe pain. 30 tablet 0  . PRESCRIPTION MEDICATION Chemo at [facility]    . acetaminophen (TYLENOL) 325 MG tablet Take 2 tablets (650 mg total) by mouth every 6 (six) hours as needed for mild pain (or Temp > 100). (Patient not taking: Reported on 01/11/2015)    . cyclobenzaprine (FLEXERIL) 10 MG tablet Take 1 tablet (10 mg total) by mouth 3 (three) times daily as needed for muscle spasms. (Patient not taking: Reported on 01/11/2015) 30 tablet 0  . diazepam (VALIUM) 2 MG tablet Take 1 tablet (2 mg total) by mouth every 6 (six) hours as needed for anxiety or muscle spasms. (Patient not taking: Reported on 01/11/2015) 30 tablet 0  . enoxaparin (LOVENOX) 100 MG/ML injection Inject 0.95 mLs (95 mg total) into the skin every 12 (twelve) hours. (Patient not taking: Reported on 01/11/2015) 60 Syringe 0  . ferrous sulfate 325 (65 FE) MG tablet Take 325 mg by mouth 3 (three) times daily with meals.    Marland Kitchen HYDROcodone-homatropine (HYCODAN) 5-1.5 MG/5ML syrup Take 5 mLs by mouth every 6 (six) hours as needed for cough. (Patient not taking: Reported on 12/04/2014) 120 mL 0  . ibuprofen (ADVIL,MOTRIN) 800 MG tablet Take 800 mg by mouth every 8 (eight) hours as needed (pain).    Marland Kitchen ipratropium (ATROVENT) 0.03 % nasal spray Place 2 sprays into the nose 2 (two) times daily. PRN congestion (Patient not taking: Reported on 01/11/2015) 30 mL 0  . lidocaine-prilocaine (EMLA) cream Apply 1 application topically as needed (For port-a-cath.). (Patient not taking: Reported on 01/11/2015) 30 g 1  . LORazepam (ATIVAN) 0.5 MG tablet Take 1 tablet (0.5 mg total) by mouth every 6 (six) hours as needed (Nausea or vomiting). (Patient not taking: Reported on 01/11/2015) 30 tablet 0  . ondansetron (ZOFRAN) 8 MG tablet Take 8 mg by mouth 2 (two) times daily as needed for nausea or vomiting.      No current facility-administered  medications for this visit.    OBJECTIVE: Young-appearing African American woman Who appears stated age 44 Vitals:   01/11/15 1216  BP: 98/58  Pulse:   Temp:   Resp:      Body mass index is 34.98 kg/(m^2).      ECOG FS:1 - Symptomatic but completely ambulatory  Skin: warm, dry  HEENT: sclerae anicteric, conjunctivae pink, oropharynx clear. No thrush or mucositis.  Lymph Nodes: No cervical or supraclavicular lymphadenopathy  Lungs: clear  to auscultation bilaterally, no rales, wheezes, or rhonci  Heart: regular rate and rhythm  Abdomen: round, soft, non tender, positive bowel sounds  Musculoskeletal: No focal spinal tenderness, no peripheral edema  Neuro: non focal, well oriented, positive affect    LAB RESULTS:  CMP     Component Value Date/Time   NA 139 01/11/2015 1133   NA 140 01/09/2015 1420   K 3.4* 01/11/2015 1133   K 3.4* 01/09/2015 1420   CL 105 01/09/2015 1420   CO2 24 01/11/2015 1133   CO2 28 01/09/2015 1420   GLUCOSE 104 01/11/2015 1133   GLUCOSE 87 01/09/2015 1420   BUN 18.6 01/11/2015 1133   BUN 22 01/09/2015 1420   CREATININE 1.0 01/11/2015 1133   CREATININE 1.04 01/09/2015 1420   CALCIUM 8.7 01/11/2015 1133   CALCIUM 8.9 01/09/2015 1420   PROT 6.1* 01/11/2015 1133   PROT 7.0 01/09/2015 1420   ALBUMIN 3.4* 01/11/2015 1133   ALBUMIN 4.1 01/09/2015 1420   AST 9 01/11/2015 1133   AST 15 01/09/2015 1420   ALT 8 01/11/2015 1133   ALT 16 01/09/2015 1420   ALKPHOS 89 01/11/2015 1133   ALKPHOS 88 01/09/2015 1420   BILITOT <0.20 01/11/2015 1133   BILITOT 0.3 01/09/2015 1420   GFRNONAA 66* 01/09/2015 1420   GFRAA 76* 01/09/2015 1420    I No results found for: SPEP  Lab Results  Component Value Date   WBC 7.7 01/11/2015   NEUTROABS 4.7 01/11/2015   HGB 8.4* 01/11/2015   HCT 25.5* 01/11/2015   MCV 89.2 01/11/2015   PLT 245 01/11/2015        Chemistry      Component Value Date/Time   NA 139 01/11/2015 1133   NA 140 01/09/2015 1420   K  3.4* 01/11/2015 1133   K 3.4* 01/09/2015 1420   CL 105 01/09/2015 1420   CO2 24 01/11/2015 1133   CO2 28 01/09/2015 1420   BUN 18.6 01/11/2015 1133   BUN 22 01/09/2015 1420   CREATININE 1.0 01/11/2015 1133   CREATININE 1.04 01/09/2015 1420      Component Value Date/Time   CALCIUM 8.7 01/11/2015 1133   CALCIUM 8.9 01/09/2015 1420   ALKPHOS 89 01/11/2015 1133   ALKPHOS 88 01/09/2015 1420   AST 9 01/11/2015 1133   AST 15 01/09/2015 1420   ALT 8 01/11/2015 1133   ALT 16 01/09/2015 1420   BILITOT <0.20 01/11/2015 1133   BILITOT 0.3 01/09/2015 1420       No results found for: LABCA2  No components found for: LABCA125   Recent Labs Lab 01/09/15 1522  INR 1.08    Urinalysis    Component Value Date/Time   COLORURINE RED* 01/09/2015 1409   APPEARANCEUR CLOUDY* 01/09/2015 1409   LABSPEC 1.025 01/09/2015 1409   LABSPEC 1.020 09/02/2014 1142   PHURINE 5.0 01/09/2015 1409   GLUCOSEU NEGATIVE 01/09/2015 1409   GLUCOSEU Negative 09/02/2014 1142   HGBUR LARGE* 01/09/2015 1409   BILIRUBINUR SMALL* 01/09/2015 1409   KETONESUR NEGATIVE 01/09/2015 1409   PROTEINUR 100* 01/09/2015 1409   UROBILINOGEN 0.2 01/09/2015 1409   UROBILINOGEN 0.2 09/02/2014 1142   NITRITE NEGATIVE 01/09/2015 1409   LEUKOCYTESUR SMALL* 01/09/2015 1409    STUDIES: Most recent echocardiogram on 10/20/14 showed an EF of 55%  Assessment: 44 y.o. BRCA-1 positive [place] woman  1. S/p left breast upper outer quadrant biopsy of two separate breast masses and one axillary lymph node 01/29/2014 for a , clinical mT2 N1  stage IIB, invasive ductal carcinoma, grade 2-3,  estrogen and progesterone receptor negative, with an MIB-1 between 79-100%, and HER 2 amplified  2. completed 6 cycles of carboplatin, docetaxel, trastuzumab and pertuzumab 06/30/2014 with MRI 07/07/2014 showing a complete radiologic response  3. trastuzumab was to be continued to complete a year (through March 2016); however echocardiogram  07/05/2014 showed an ejection fraction of 45-50%-- trastuzumab was held after 06/30/2014 doseuntil EF recovery  (a) cath report from 09/13/2014 shows normal coronaries and a normal left ventricular function with an ejection fraction of 55%.  4. 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   5. MRSA port infection and septicemia mid-June 2015;  Port was removed, and  PICC line placed, completed antibiotic therapy with ANCEF; PICC subsequently pulled   6 genetic testing with the [facility] panel and was found to have a pathogenic mutation in the BRCA1 gene called c.190T>G (p.Cys64Gly  7. status post bilateral mastectomies and left axillary sentinel lymph node biopsy, withimmediate expander placement, on 07/28/14; the pathology ([id number]) showed a complete pathologic response in the left breast and the 3 sentinel lymph nodes sampled; the right breast was benign  8. Bilateral salpingo-oophorectomy pending.  PLAN:  Brayden and I spent 25 minutes in the visit. We discussed her lovenox injections and she will continue to hold them this week while she is actively bleeding. She has been on lovenox for over 6 months and could probably come off of it now permanently. I am putting in orders for a d-dimer in order to decide this.   Seleste will proceed with trastuzumab as planned today. She is due for a repeat echocardiogram and I have placed orders for this to be performed this week. She is ready for a bilateral salpingo-oophorectomy as well so I have referred her to Dr. Serita Grit office as well.   Hasina will continue trastuzumab every 3 weeks through September. She will return for labs and a follow up visit here in 3 months. She understands and agrees with this plan. She knows the goal of treatment in her case is cure. She has been encouraged to call with any issues that might arise before her next visit here.   Marcelino Duster, NP  01/11/2015 1:09 PM

## 2015-01-12 LAB — D-DIMER, QUANTITATIVE (NOT AT ARMC): D DIMER QUANT: 0.39 ug{FEU}/mL (ref 0.00–0.48)

## 2015-01-13 ENCOUNTER — Ambulatory Visit (HOSPITAL_COMMUNITY): Admission: RE | Admit: 2015-01-13 | Payer: Medicaid Other | Source: Ambulatory Visit

## 2015-01-24 ENCOUNTER — Encounter: Payer: Self-pay | Admitting: Gynecologic Oncology

## 2015-01-24 ENCOUNTER — Ambulatory Visit: Payer: Medicaid Other | Attending: Gynecologic Oncology | Admitting: Gynecologic Oncology

## 2015-01-24 VITALS — BP 125/81 | HR 70 | Temp 98.1°F | Resp 20 | Ht 65.0 in | Wt 213.8 lb

## 2015-01-24 DIAGNOSIS — Z8041 Family history of malignant neoplasm of ovary: Secondary | ICD-10-CM | POA: Insufficient documentation

## 2015-01-24 DIAGNOSIS — Z9013 Acquired absence of bilateral breasts and nipples: Secondary | ICD-10-CM | POA: Insufficient documentation

## 2015-01-24 DIAGNOSIS — D509 Iron deficiency anemia, unspecified: Secondary | ICD-10-CM | POA: Diagnosis not present

## 2015-01-24 DIAGNOSIS — Z803 Family history of malignant neoplasm of breast: Secondary | ICD-10-CM | POA: Diagnosis not present

## 2015-01-24 DIAGNOSIS — I10 Essential (primary) hypertension: Secondary | ICD-10-CM | POA: Insufficient documentation

## 2015-01-24 DIAGNOSIS — Z87891 Personal history of nicotine dependence: Secondary | ICD-10-CM | POA: Diagnosis not present

## 2015-01-24 DIAGNOSIS — Z171 Estrogen receptor negative status [ER-]: Secondary | ICD-10-CM | POA: Insufficient documentation

## 2015-01-24 DIAGNOSIS — Z315 Encounter for genetic counseling: Secondary | ICD-10-CM | POA: Insufficient documentation

## 2015-01-24 DIAGNOSIS — D251 Intramural leiomyoma of uterus: Secondary | ICD-10-CM | POA: Insufficient documentation

## 2015-01-24 DIAGNOSIS — Z1509 Genetic susceptibility to other malignant neoplasm: Secondary | ICD-10-CM

## 2015-01-24 DIAGNOSIS — Z86718 Personal history of other venous thrombosis and embolism: Secondary | ICD-10-CM | POA: Insufficient documentation

## 2015-01-24 DIAGNOSIS — Z1501 Genetic susceptibility to malignant neoplasm of breast: Secondary | ICD-10-CM

## 2015-01-24 DIAGNOSIS — D259 Leiomyoma of uterus, unspecified: Secondary | ICD-10-CM

## 2015-01-24 DIAGNOSIS — Z1502 Genetic susceptibility to malignant neoplasm of ovary: Secondary | ICD-10-CM | POA: Insufficient documentation

## 2015-01-24 DIAGNOSIS — Z853 Personal history of malignant neoplasm of breast: Secondary | ICD-10-CM | POA: Diagnosis not present

## 2015-01-24 NOTE — Patient Instructions (Signed)
Preparing for your Surgery  Plan for surgery on February 08 2015 with Dr. Denman George,  Pre-operative Glenn will receive a phone call from presurgical testing at [facility] to arrange for a pre-operative testing appointment before your surgery.  This appointment normally occurs one to two weeks before your scheduled surgery.   -Bring your insurance card, copy of an advanced directive if applicable, medication list  -At that visit, you will be asked to sign a consent for a possible blood transfusion in case a transfusion becomes necessary during surgery.  The need for a blood transfusion is rare but having consent is a necessary part of your care.     -You should not be taking blood thinners or aspirin at least ten days prior to surgery unless instructed by your surgeon.  Day Before Surgery at [facility] will be asked to take in only clear liquids the day before surgery.  Examples of clear liquids include broths, jello, and clear juices.    You will be advised to have nothing to eat or drink after midnight the evening before.    Your role in recovery Your role is to become active as soon as directed by your doctor, while still giving yourself time to heal.  Rest when you feel tired. You will be asked to do the following in order to speed your recovery:  - Cough and breathe deeply. This helps toclear and expand your lungs and can prevent pneumonia. You may be given a spirometer to practice deep breathing. A staff member will show you how to use the spirometer. - Do mild physical activity. Walking or moving your legs help your circulation and body functions return to normal. A staff member will help you when you try to walk and will provide you with simple exercises. Do not try to get up or walk alone the first time. - Actively manage your pain. Managing your pain lets you move in comfort. We will ask you to rate your pain on a scale of zero to 10. It is your responsibility to  tell your doctor or nurse where and how much you hurt so your pain can be treated.  Special Considerations -If you are diabetic, you may be placed on insulin after surgery to have closer control over your blood sugars to promote healing and recovery.  This does not mean that you will be discharged on insulin.  If applicable, your oral antidiabetics will be resumed when you are tolerating a solid diet.  -Your final pathology results from surgery should be available by the Friday after surgery and the results will be relayed to you when available.  Hysterectomy Information  A hysterectomy is a surgery in which your uterus is removed. This surgery may be done to treat various medical problems. After the surgery, you will no longer have menstrual periods. The surgery will also make you unable to become pregnant (sterile). The fallopian tubes and ovaries can be removed (bilateral salpingo-oophorectomy) during this surgery as well.  REASONS FOR A HYSTERECTOMY  Persistent, abnormal bleeding.  Lasting (chronic) pelvic pain or infection.  The lining of the uterus (endometrium) starts growing outside the uterus (endometriosis).  The endometrium starts growing in the muscle of the uterus (adenomyosis).  The uterus falls down into the vagina (pelvic organ prolapse).  Noncancerous growths in the uterus (uterine fibroids) that cause symptoms.  Precancerous cells.  Cervical cancer or uterine cancer. TYPES OF HYSTERECTOMIES  Supracervical hysterectomy--In this type, the top part of the  uterus is removed, but not the cervix.  Total hysterectomy--The uterus and cervix are removed.  Radical hysterectomy--The uterus, the cervix, and the fibrous tissue that holds the uterus in place in the pelvis (parametrium) are removed. WAYS A HYSTERECTOMY CAN BE PERFORMED  Abdominal hysterectomy--A large surgical cut (incision) is made in the abdomen. The uterus is removed through this incision.  Vaginal  hysterectomy--An incision is made in the vagina. The uterus is removed through this incision. There are no abdominal incisions.  Conventional laparoscopic hysterectomy--Three or four small incisions are made in the abdomen. A thin, lighted tube with a camera (laparoscope) is inserted into one of the incisions. Other tools are put through the other incisions. The uterus is cut into small pieces. The small pieces are removed through the incisions, or they are removed through the vagina.  Laparoscopically assisted vaginal hysterectomy (LAVH)--Three or four small incisions are made in the abdomen. Part of the surgery is performed laparoscopically and part vaginally. The uterus is removed through the vagina.  Robot-assisted laparoscopic hysterectomy--A laparoscope and other tools are inserted into 3 or 4 small incisions in the abdomen. A computer-controlled device is used to give the surgeon a 3D image and to help control the surgical instruments. This allows for more precise movements of surgical instruments. The uterus is cut into small pieces and removed through the incisions or removed through the vagina. RISKS AND COMPLICATIONS  Possible complications associated with this procedure include:  Bleeding and risk of blood transfusion. Tell your health care provider if you do not want to receive any blood products.  Blood clots in the legs or lung.  Infection.  Injury to surrounding organs.  Problems or side effects related to anesthesia.  Conversion to an abdominal hysterectomy from one of the other techniques. WHAT TO EXPECT AFTER A HYSTERECTOMY  You will be given pain medicine.  You will need to have someone with you for the first 3-5 days after you go home.  You will need to follow up with your surgeon in 2-4 weeks after surgery to evaluate your progress.  You may have early menopause symptoms such as hot flashes, night sweats, and insomnia.  If you had a hysterectomy for a problem that  was not cancer or not a condition that could lead to cancer, then you no longer need Pap tests. However, even if you no longer need a Pap test, a regular exam is a good idea to make sure no other problems are starting. Document Released: 05/15/2001 Document Revised: 09/09/2013 Document Reviewed: 07/27/2013 [facility] Patient Information 2015 [place], [place]. This information is not intended to replace advice given to you by your health care provider. Make sure you discuss any questions you have with your health care provider.

## 2015-01-25 ENCOUNTER — Ambulatory Visit (HOSPITAL_COMMUNITY): Payer: Medicaid Other | Attending: Nurse Practitioner

## 2015-01-25 ENCOUNTER — Encounter (HOSPITAL_COMMUNITY): Payer: Medicaid Other

## 2015-01-25 ENCOUNTER — Encounter: Payer: Self-pay | Admitting: Gynecologic Oncology

## 2015-01-25 NOTE — Progress Notes (Signed)
Consult Note: Gyn-Onc  Consult was requested by  Susanne Borders, NP for the evaluation of Lashonne Shull 44 y.o. female with a deleterious mutation of BRCA1  CC:  Chief Complaint  Patient presents with  . BRAC gene    To discuss hysterectomy    Assessment/Plan:  Ms. Antionetta Ator  is a 44 y.o.  year old with a deleterious mutation of BRCA1. She desires definitive risk reducing surgery with hysterectomy and BSO. She also has a history of uterine fibroids, and a recent history of a deep vein thrombosis associated with a Port-A-Cath.  I discussed with Mardene Celeste that she is an acceptable risk for undergoing a surgery. Her surgeries likely be more complex given her bulky uterine size, history of fibroids, history of cesarean section, and history of DVT approximately 8 months ago. I discussed that this puts her at increased risk for recurrent VTE event. I am recommending prophylactic Lovenox for one month postoperatively to reduce this risk. I discussed with the patient that being on Lovenox prophylaxis increases her risk for bleeding, location. We also discussed the potential need for a minilaparotomy to deliver the uterine specimen given her bulky fibroid uterus. We also discussed surgical risks including  bleeding, infection, damage to internal organs (such as bladder,ureters, bowels), blood clot, reoperation and rehospitalization.   We discussed the indications of the BRCA1 deleterious mutation including increased risk for fallopian tube and ovarian cancer and her lifetime risk of approximately 35-40%. I discussed that this risk and almost be completely eliminated but not completely eliminated with bilateral salpingo-oophorectomy. The patient desires to pursue surgery with a robotic hysterectomy, BSO. She understands that this is associated with surgical menopause postoperatively. Of note her personal history of breast cancer was for an ER/PR negative tumor. Given her premenopausal age and her hormone  receptor negative breast cancer history I am recommending postoperative estrogen replacement therapy until age of natural menopause which is approximately age 70. I discussed that this decreases her risk of all cause mortality.   HPI: Jadelin is a 44 year old para 5 who is seen in consultation at the request of Susanne Borders NP for a deleterious mutation in BRCA1. The patient desires definitive risk reducing surgery with hysterectomy BSO. 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 She was treated with chemotherapy and surgery. She is currently taking Herceptin.  Her breast cancer therapy was complicated with development of a DVT at the Port-A-Cath site in June 2015. Further she was treated with anticoagulation has been off that for a proximally one month.   Her family history is most striking the positive for breast cancer and she is known for serious secondary relatives with a history of ovarian cancer. She carries the BRCA 1 deleterious gene mutation through her father's side.  The patient has had one prior cesarean section and 4 SVTs. She has a known fibroid uterus. CT scan performed in 2011 revealed multiple small intramural fibroids and a bulky uterus. I personally reviewed these films myself. She continues to get heavy menses. She strongly desires hysterectomy as part of her wrist reductive surgery given her symptomatic uterine fibroids.   Current Meds:  Outpatient Encounter Prescriptions as of 01/24/2015  Medication Sig  . acetaminophen (TYLENOL) 325 MG tablet Take 2 tablets (650 mg total) by mouth every 6 (six) hours as needed for mild pain (or Temp > 100).  . carvedilol (COREG) 12.5 MG tablet Take 1 tablet (12.5 mg  total) by mouth 2 (two) times daily with a meal.  . cyclobenzaprine (FLEXERIL) 10 MG tablet Take 1 tablet (10 mg total) by mouth 3 (three) times daily as needed for muscle spasms.  . diazepam  (VALIUM) 2 MG tablet Take 1 tablet (2 mg total) by mouth every 6 (six) hours as needed for anxiety or muscle spasms.  . DULoxetine (CYMBALTA) 30 MG capsule Take 1 capsule (30 mg total) by mouth daily.  . ferrous sulfate 325 (65 FE) MG tablet Take 325 mg by mouth 3 (three) times daily with meals.  . hydrALAZINE (APRESOLINE) 50 MG tablet Take 1 tablet (50 mg total) by mouth every 8 (eight) hours.  . hydrochlorothiazide (MICROZIDE) 12.5 MG capsule Take 1 capsule (12.5 mg total) by mouth daily.  Marland Kitchen ibuprofen (ADVIL,MOTRIN) 800 MG tablet Take 800 mg by mouth every 8 (eight) hours as needed (pain).  Marland Kitchen ipratropium (ATROVENT) 0.03 % nasal spray Place 2 sprays into the nose 2 (two) times daily. PRN congestion  . lidocaine-prilocaine (EMLA) cream Apply 1 application topically as needed (For port-a-cath.).  Marland Kitchen lisinopril (PRINIVIL,ZESTRIL) 40 MG tablet Take 1 tablet (40 mg total) by mouth daily.  Marland Kitchen LORazepam (ATIVAN) 0.5 MG tablet Take 1 tablet (0.5 mg total) by mouth every 6 (six) hours as needed (Nausea or vomiting).  . ondansetron (ZOFRAN) 8 MG tablet Take 8 mg by mouth 2 (two) times daily as needed for nausea or vomiting.   Marland Kitchen oxyCODONE (OXY IR/ROXICODONE) 5 MG immediate release tablet Take 1 tablet (5 mg total) by mouth every 4 (four) hours as needed for severe pain.  Marland Kitchen PRESCRIPTION MEDICATION Chemo at [facility]  . [DISCONTINUED] enoxaparin (LOVENOX) 100 MG/ML injection Inject 0.95 mLs (95 mg total) into the skin every 12 (twelve) hours. (Patient not taking: Reported on 01/11/2015)  . [DISCONTINUED] HYDROcodone-homatropine (HYCODAN) 5-1.5 MG/5ML syrup Take 5 mLs by mouth every 6 (six) hours as needed for cough. (Patient not taking: Reported on 12/04/2014)    Allergy:  Allergies  Allergen Reactions  . Compazine [Prochlorperazine Edisylate] Other (See Comments)    Stuttering    Social Hx:   History   Social History  . Marital Status: Legally Separated    Spouse Name: N/A  . Number of Children: 5  . Years  of Education: N/A   Occupational History  . Not on file.   Social History Main Topics  . Smoking status: Former Smoker -- 0.25 packs/day for 15 years    Quit date: 02/18/2009  . Smokeless tobacco: Former Systems developer  . Alcohol Use: No     Comment: occasional  . Drug Use: No  . Sexual Activity: Not Currently    Birth Control/ Protection: Surgical   Other Topics Concern  . Not on file   Social History Narrative    Past Surgical Hx:  Past Surgical History  Procedure Laterality Date  . Cervical polypectomy  2010  . Cesarean section      one previous  . Tubal ligation    . Portacath placement Right 02/23/2014    Procedure: INSERTION PORT-A-CATH;  Surgeon: Joyice Faster. Cornett, MD;  Location: [place];  Service: General;  Laterality: Right;  . Port-a-cath removal N/A 05/21/2014    Procedure: REMOVAL PORT-A-CATH;  Surgeon: Zenovia Jarred, MD;  Location: [place];  Service: General;  Laterality: N/A;  . Tee without cardioversion N/A 05/26/2014    Procedure: TRANSESOPHAGEAL ECHOCARDIOGRAM (TEE);  Surgeon: Larey Dresser, MD;  Location: [place];  Service: Cardiovascular;  Laterality:  N/A;  . Bilateral total mastectomy with axillary lymph node dissection Bilateral 07/28/2014    Procedure: BILATERAL TOTAL MASTECTOMY WITH LEFT AXILLARY SENTINEL LYMPH NODE BIOPSY;  Surgeon: Stark Klein, MD;  Location: [place];  Service: General;  Laterality: Bilateral;  . Breast reconstruction with placement of tissue expander and flex hd (acellular hydrated dermis) Bilateral 07/28/2014    Procedure: BILATERAL BREAST RECONSTRUCTION WITH PLACEMENT OF TISSUE EXPANDER AND FLEX HD (ACELLULAR HYDRATED DERMIS);  Surgeon: Theodoro Kos, DO;  Location: [place];  Service: Plastics;  Laterality: Bilateral;  . Removal of tissue expander and placement of implant Bilateral 10/14/2014    Procedure: REMOVAL OF BILATERAL BREAST  TISSUE EXPANDER  WITH PLASCEMENT OF BILATERAL  BREAST IMPLANTS;  Surgeon: Theodoro Kos, DO;   Location: [place];  Service: Plastics;  Laterality: Bilateral;  . Left heart catheterization with coronary angiogram N/A 09/13/2014    Procedure: LEFT HEART CATHETERIZATION WITH CORONARY ANGIOGRAM;  Surgeon: Jolaine Artist, MD;  Location: [facility] CATH LAB;  Service: Cardiovascular;  Laterality: N/A;    Past Medical Hx:  Past Medical History  Diagnosis Date  . Endometriosis   . Hypertension   . Rash     fine rash on abd  . Anemia   . Wears glasses   . Iron deficiency anemia, unspecified 02/25/2014  . BRCA1 positive     c.190T>G (p.Cys64Gly) @ [facility]   . Shortness of breath      when Hemoglobin low  . History of blood transfusion   . DVT (deep venous thrombosis)     Right Subclavian and IJ.  . Breast cancer 02/01/14    ER-/PR-/Her2+, still receiving chemo 12/21/14    Past Gynecological History:  SVD x 4, CSx1  No LMP recorded. Patient is not currently having periods (Reason: Chemotherapy).  Family Hx:  Family History  Problem Relation Age of Onset  . Breast cancer Mother 64    currently 28  . Diabetes Father   . Pancreatic cancer Father 64  . Breast cancer Paternal Aunt 73    currently 23; BRCA1 positive  . Stroke Maternal Grandfather   . Cancer Paternal Aunt     unk. primary; deceased 26s  . Breast cancer Cousin     daughter of unaffected paternal aunt; dx in her 66s    Review of Systems:  Constitutional  Feels well,    ENT Normal appearing ears and nares bilaterally Skin/Breast  No rash, sores, jaundice, itching, dryness Cardiovascular  No chest pain, shortness of breath, or edema  Pulmonary  No cough or wheeze.  Gastro Intestinal  No nausea, vomitting, or diarrhoea. No bright red blood per rectum, no abdominal pain, change in bowel movement, or constipation.  Genito Urinary  No frequency, urgency, dysuria,  Musculo Skeletal  No myalgia, arthralgia, joint swelling or pain  Neurologic  No weakness, numbness, change in gait,  Psychology  No  depression, anxiety, insomnia.   Vitals:  Blood pressure 125/81, pulse 70, temperature 98.1 F (36.7 C), resp. rate 20, height 5' 5"  (1.651 m), weight 213 lb 12.8 oz (96.979 kg).  Physical Exam: WD in NAD Neck  Supple NROM, without any enlargements.  Lymph Node Survey No cervical supraclavicular or inguinal adenopathy Cardiovascular  Pulse normal rate, regularity and rhythm. S1 and S2 normal.  Lungs  Clear to auscultation bilateraly, without wheezes/crackles/rhonchi. Good air movement.  Skin  No rash/lesions/breakdown  Psychiatry  Alert and oriented to person, place, and time  Abdomen  Normoactive bowel sounds, abdomen soft,  non-tender and overweight without evidence of hernia.  Back No CVA tenderness Genito Urinary  Vulva/vagina: Normal external female genitalia.  No lesions. No discharge or bleeding.  Bladder/urethra:  No lesions or masses, well supported bladder  Vagina: normal  Cervix: Normal appearing, no lesions.  Uterus: 14 week size, mobile, no parametrial involvement or nodularity.  Adnexa: no discrete palpable masses. Rectal  Good tone, no masses no cul de sac nodularity.  Extremities  No bilateral cyanosis, clubbing or edema.   Donaciano Eva, MD   01/24/2015, 5:31 PM

## 2015-01-30 ENCOUNTER — Emergency Department (HOSPITAL_COMMUNITY): Payer: Medicaid Other

## 2015-01-30 ENCOUNTER — Encounter (HOSPITAL_COMMUNITY): Payer: Self-pay | Admitting: Emergency Medicine

## 2015-01-30 ENCOUNTER — Emergency Department (HOSPITAL_COMMUNITY)
Admission: EM | Admit: 2015-01-30 | Discharge: 2015-01-30 | Disposition: A | Discharge status: Home / Self Care | Payer: Medicaid Other | Attending: Emergency Medicine | Admitting: Emergency Medicine

## 2015-01-30 DIAGNOSIS — D259 Leiomyoma of uterus, unspecified: Secondary | ICD-10-CM | POA: Diagnosis not present

## 2015-01-30 DIAGNOSIS — Z3202 Encounter for pregnancy test, result negative: Secondary | ICD-10-CM | POA: Diagnosis not present

## 2015-01-30 DIAGNOSIS — Z79899 Other long term (current) drug therapy: Secondary | ICD-10-CM | POA: Insufficient documentation

## 2015-01-30 DIAGNOSIS — N939 Abnormal uterine and vaginal bleeding, unspecified: Secondary | ICD-10-CM

## 2015-01-30 DIAGNOSIS — Z86718 Personal history of other venous thrombosis and embolism: Secondary | ICD-10-CM | POA: Insufficient documentation

## 2015-01-30 DIAGNOSIS — D649 Anemia, unspecified: Secondary | ICD-10-CM | POA: Diagnosis not present

## 2015-01-30 DIAGNOSIS — Z9889 Other specified postprocedural states: Secondary | ICD-10-CM | POA: Insufficient documentation

## 2015-01-30 DIAGNOSIS — Z853 Personal history of malignant neoplasm of breast: Secondary | ICD-10-CM | POA: Diagnosis not present

## 2015-01-30 DIAGNOSIS — Z87891 Personal history of nicotine dependence: Secondary | ICD-10-CM | POA: Insufficient documentation

## 2015-01-30 DIAGNOSIS — D509 Iron deficiency anemia, unspecified: Secondary | ICD-10-CM | POA: Insufficient documentation

## 2015-01-30 DIAGNOSIS — I10 Essential (primary) hypertension: Secondary | ICD-10-CM | POA: Insufficient documentation

## 2015-01-30 DIAGNOSIS — N938 Other specified abnormal uterine and vaginal bleeding: Secondary | ICD-10-CM | POA: Diagnosis not present

## 2015-01-30 DIAGNOSIS — Z9851 Tubal ligation status: Secondary | ICD-10-CM | POA: Diagnosis not present

## 2015-01-30 HISTORY — DX: Benign neoplasm of connective and other soft tissue, unspecified: D21.9

## 2015-01-30 LAB — CBC
HCT: 30.4 % — ABNORMAL LOW (ref 36.0–46.0)
HEMOGLOBIN: 9.6 g/dL — AB (ref 12.0–15.0)
MCH: 29.3 pg (ref 26.0–34.0)
MCHC: 31.6 g/dL (ref 30.0–36.0)
MCV: 92.7 fL (ref 78.0–100.0)
PLATELETS: 394 10*3/uL (ref 150–400)
RBC: 3.28 MIL/uL — ABNORMAL LOW (ref 3.87–5.11)
RDW: 14.1 % (ref 11.5–15.5)
WBC: 6.6 10*3/uL (ref 4.0–10.5)

## 2015-01-30 LAB — PREGNANCY, URINE: Preg Test, Ur: NEGATIVE

## 2015-01-30 MED ORDER — HYDROCODONE-ACETAMINOPHEN 5-325 MG PO TABS: 1.0000 | ORAL_TABLET | ORAL | Status: DC | PRN

## 2015-01-30 MED ORDER — MORPHINE SULFATE 4 MG/ML IJ SOLN
4.0000 mg | Freq: Once | INTRAMUSCULAR | Status: AC
  Administered 2015-01-30: 4 mg via INTRAVENOUS
  Filled 2015-01-30: qty 1

## 2015-01-30 MED ORDER — NAPROXEN 250 MG PO TABS: 250.0000 mg | ORAL_TABLET | Freq: Two times a day (BID) | ORAL | Status: DC

## 2015-01-30 MED ORDER — ONDANSETRON HCL 4 MG/2ML IJ SOLN
4.0000 mg | Freq: Once | INTRAMUSCULAR | Status: AC
  Administered 2015-01-30: 4 mg via INTRAVENOUS
  Filled 2015-01-30: qty 2

## 2015-01-30 NOTE — ED Provider Notes (Signed)
CSN: [id number]     Arrival date & time 01/30/15  0930 History   First MD Initiated Contact with Patient 01/30/15 [phone]     Chief Complaint  Patient presents with  . Vaginal Bleeding  . Abdominal Pain    Tamara [name] is a 44 y.o. female with a history of breast cancer, iron deficiency anemia, endometriosis, and hypertension who presents to the ED complaining of heavy vaginal bleeding since yesterday morning at 10 AM. Patient reports she has soiled 8 pads today, 2 at a time. She reports she is also passing clots. She also complains of LLQ abdominal pain at 10/10, sharp and non-radiating. She reports she is due to have a total hysterectomy by Everitt Amber on 02/08/15. She also reports her GYN told her she has a left sided fibroid. The patient is a breast cancer survivor, but patient is still taking Herceptin. The patient is not on anticoagulants. The patient denies being sexually active in many months. The patient denies fevers, lightheadedness, dizziness, headache, dysuria, hematuria, urinary frequency, urinary urgency, nausea, vomiting, diarrhea, hematochezia, rashes or lesions.   (Consider location/radiation/quality/duration/timing/severity/associated sxs/prior Treatment) HPI  Past Medical History  Diagnosis Date  . Endometriosis   . Hypertension   . Rash     fine rash on abd  . Anemia   . Wears glasses   . Iron deficiency anemia, unspecified 02/25/2014  . BRCA1 positive     c.190T>G (p.Cys64Gly) @ [facility]   . Shortness of breath      when Hemoglobin low  . History of blood transfusion   . DVT (deep venous thrombosis)     Right Subclavian and IJ.  . Breast cancer 02/01/14    ER-/PR-/Her2+, still receiving chemo 12/21/14  . Fibroid tumor    Past Surgical History  Procedure Laterality Date  . Cervical polypectomy  2010  . Cesarean section      one previous  . Tubal ligation    . Portacath placement Right 02/23/2014    Procedure: INSERTION PORT-A-CATH;  Surgeon: Joyice Faster. Cornett, MD;   Location: [place];  Service: General;  Laterality: Right;  . Port-a-cath removal N/A 05/21/2014    Procedure: REMOVAL PORT-A-CATH;  Surgeon: Zenovia Jarred, MD;  Location: [place];  Service: General;  Laterality: N/A;  . Tee without cardioversion N/A 05/26/2014    Procedure: TRANSESOPHAGEAL ECHOCARDIOGRAM (TEE);  Surgeon: Larey Dresser, MD;  Location: [facility] ENDOSCOPY;  Service: Cardiovascular;  Laterality: N/A;  . Bilateral total mastectomy with axillary lymph node dissection Bilateral 07/28/2014    Procedure: BILATERAL TOTAL MASTECTOMY WITH LEFT AXILLARY SENTINEL LYMPH NODE BIOPSY;  Surgeon: Stark Klein, MD;  Location: [place];  Service: General;  Laterality: Bilateral;  . Breast reconstruction with placement of tissue expander and flex hd (acellular hydrated dermis) Bilateral 07/28/2014    Procedure: BILATERAL BREAST RECONSTRUCTION WITH PLACEMENT OF TISSUE EXPANDER AND FLEX HD (ACELLULAR HYDRATED DERMIS);  Surgeon: Theodoro Kos, DO;  Location: [place];  Service: Plastics;  Laterality: Bilateral;  . Removal of tissue expander and placement of implant Bilateral 10/14/2014    Procedure: REMOVAL OF BILATERAL BREAST  TISSUE EXPANDER  WITH PLASCEMENT OF BILATERAL  BREAST IMPLANTS;  Surgeon: Theodoro Kos, DO;  Location: [place];  Service: Plastics;  Laterality: Bilateral;  . Left heart catheterization with coronary angiogram N/A 09/13/2014    Procedure: LEFT HEART CATHETERIZATION WITH CORONARY ANGIOGRAM;  Surgeon: Jolaine Artist, MD;  Location: [facility] CATH LAB;  Service: Cardiovascular;  Laterality: N/A;  Family History  Problem Relation Age of Onset  . Breast cancer Mother 56    currently 59  . Diabetes Father   . Pancreatic cancer Father 55  . Breast cancer Paternal Aunt 48    currently 60; BRCA1 positive  . Stroke Maternal Grandfather   . Cancer Paternal Aunt     unk. primary; deceased 70s  . Breast cancer Cousin     daughter of unaffected paternal aunt; dx in  her 40s   History  Substance Use Topics  . Smoking status: Former Smoker -- 0.25 packs/day for 15 years    Quit date: 02/18/2009  . Smokeless tobacco: Former User  . Alcohol Use: No     Comment: occasional   OB History    Gravida Para Term Preterm AB TAB SAB Ectopic Multiple Living   5 5 5       5     Review of Systems  Constitutional: Negative for fever and chills.  HENT: Negative for congestion and sore throat.   Eyes: Negative for visual disturbance.  Respiratory: Negative for cough, shortness of breath and wheezing.   Cardiovascular: Negative for chest pain and palpitations.  Gastrointestinal: Positive for abdominal pain. Negative for nausea, vomiting, diarrhea and blood in stool.  Genitourinary: Positive for vaginal bleeding and pelvic pain. Negative for dysuria, urgency, frequency, flank pain, difficulty urinating and genital sores.  Musculoskeletal: Negative for back pain and neck pain.  Skin: Negative for rash and wound.  Neurological: Negative for syncope, weakness, light-headedness and headaches.      Allergies  Compazine  Home Medications   Prior to Admission medications   Medication Sig Start Date End Date Taking? Authorizing Provider  carvedilol (COREG) 12.5 MG tablet Take 1 tablet (12.5 mg total) by mouth 2 (two) times daily with a meal. 12/16/14  Yes Olugbemiga E Jegede, MD  diazepam (VALIUM) 2 MG tablet Take 1 tablet (2 mg total) by mouth every 6 (six) hours as needed for anxiety or muscle spasms. 09/10/14  Yes Heather L Ferrell, NP  DULoxetine (CYMBALTA) 30 MG capsule Take 1 capsule (30 mg total) by mouth daily. 12/16/14  Yes Olugbemiga E Jegede, MD  ferrous sulfate 325 (65 FE) MG tablet Take 325 mg by mouth 3 (three) times daily with meals.   Yes Historical Provider, MD  hydrALAZINE (APRESOLINE) 50 MG tablet Take 1 tablet (50 mg total) by mouth every 8 (eight) hours. 12/16/14  Yes Olugbemiga E Jegede, MD  hydrochlorothiazide (MICROZIDE) 12.5 MG capsule Take 1  capsule (12.5 mg total) by mouth daily. 12/16/14  Yes Olugbemiga E Jegede, MD  ibuprofen (ADVIL,MOTRIN) 800 MG tablet Take 800 mg by mouth every 8 (eight) hours as needed (pain).   Yes Historical Provider, MD  lisinopril (PRINIVIL,ZESTRIL) 40 MG tablet Take 1 tablet (40 mg total) by mouth daily. 12/16/14  Yes Olugbemiga E Jegede, MD  PRESCRIPTION MEDICATION Chemo at [facility]   Yes Historical Provider, MD  acetaminophen (TYLENOL) 325 MG tablet Take 2 tablets (650 mg total) by mouth every 6 (six) hours as needed for mild pain (or Temp > 100). 07/30/14   Shawn Rayburn, PA-C  cyclobenzaprine (FLEXERIL) 10 MG tablet Take 1 tablet (10 mg total) by mouth 3 (three) times daily as needed for muscle spasms. 07/31/14   Shawn Rayburn, PA-C  HYDROcodone-acetaminophen (NORCO/VICODIN) 5-325 MG per tablet Take 1-2 tablets by mouth every 4 (four) hours as needed. 01/30/15   William Duncan Dansie, PA-C  ipratropium (ATROVENT) 0.03 % nasal spray Place 2 sprays   into the nose 2 (two) times daily. PRN congestion 09/01/14   Hannah Muthersbaugh, PA-C  lidocaine-prilocaine (EMLA) cream Apply 1 application topically as needed (For port-a-cath.). 09/07/14   Gustav C Magrinat, MD  LORazepam (ATIVAN) 0.5 MG tablet Take 1 tablet (0.5 mg total) by mouth every 6 (six) hours as needed (Nausea or vomiting). 07/05/14   Lennis P Livesay, MD  naproxen (NAPROSYN) 250 MG tablet Take 1 tablet (250 mg total) by mouth 2 (two) times daily with a meal. 01/30/15   William Duncan Dansie, PA-C  ondansetron (ZOFRAN) 8 MG tablet Take 8 mg by mouth 2 (two) times daily as needed for nausea or vomiting.     Historical Provider, MD  oxyCODONE (OXY IR/ROXICODONE) 5 MG immediate release tablet Take 1 tablet (5 mg total) by mouth every 4 (four) hours as needed for severe pain. 09/10/14   Heather L Ferrell, NP   BP 124/75 mmHg  Pulse 77  Temp(Src) 98.6 F (37 C) (Oral)  Resp 16  SpO2 98% Physical Exam  Constitutional: She is oriented to person, place, and time. She  appears well-developed and well-nourished. No distress.  HENT:  Head: Normocephalic and atraumatic.  Mouth/Throat: Oropharynx is clear and moist. No oropharyngeal exudate.  Eyes: Conjunctivae are normal. Pupils are equal, round, and reactive to light. Right eye exhibits no discharge. Left eye exhibits no discharge.  Neck: Neck supple.  Cardiovascular: Normal rate, regular rhythm, normal heart sounds and intact distal pulses.   Pulmonary/Chest: Effort normal and breath sounds normal. No respiratory distress. She has no wheezes. She has no rales.  Abdominal: Soft. Bowel sounds are normal. She exhibits no distension and no mass. There is tenderness. There is no rebound and no guarding.  Abdomen is soft. Bowel sounds are present. Tenderness in left lower quadrant. No right lower quadrant tenderness. Negative psoas and obturator sign.   Genitourinary: There is no rash, tenderness, lesion or injury on the right labia. There is no rash, tenderness, lesion or injury on the left labia. Left adnexum displays tenderness. There is bleeding in the vagina.  Pelvic exam performed by me with female nurse tech chaperone. Patient has a moderate amount of blood in her vaginal vault. This was easily cleared with large Q-tip. Patient has a small amount of blood oozing from her cervical os that is not filling the vault briskly. She has left adnexal tenderness. No vaginal lacerations. No external lesions or rashes. No cervical motion tenderness.   Musculoskeletal: She exhibits no edema.  Lymphadenopathy:    She has no cervical adenopathy.  Neurological: She is alert and oriented to person, place, and time. Coordination normal.  Skin: Skin is warm and dry. No rash noted. She is not diaphoretic. No erythema. No pallor.  Psychiatric: She has a normal mood and affect. Her behavior is normal.  Nursing note and vitals reviewed.   ED Course  Procedures (including critical care time) Labs Review Labs Reviewed  CBC -  Abnormal; Notable for the following:    RBC 3.28 (*)    Hemoglobin 9.6 (*)    HCT 30.4 (*)    All other components within normal limits  PREGNANCY, URINE    Imaging Review Us Transvaginal Non-ob  01/30/2015   CLINICAL DATA:  Left adnexal tenderness for 3 days. Vaginal bleeding since yesterday. History of endometriosis and breast cancer. LMP 07/27/2015.  EXAM: TRANSABDOMINAL AND TRANSVAGINAL ULTRASOUND OF PELVIS  TECHNIQUE: Both transabdominal and transvaginal ultrasound examinations of the pelvis were performed. Transabdominal technique was performed for   global imaging of the pelvis including uterus, ovaries, adnexal regions, and pelvic cul-de-sac. It was necessary to proceed with endovaginal exam following the transabdominal exam to visualize the uterus, endometrium, and adnexal regions.  COMPARISON:  CT of the abdomen and pelvis 02/19/2014  FINDINGS: Uterus  Measurements: At least 11.2 x 5.8 x 8.8 cm. Uterus is heterogeneous. At least to discrete fibroids are measured. Posterior fibroid is 3.1 x 2.9 x 3.7 cm. Left fibroid is 3.0 x 3.5 x 3.1 cm. Nabothian cysts are seen in the cervix.  Endometrium  Thickness: 9 mm.  No focal abnormality visualized.  Right ovary  Measurements: 2.6 x 1.5 x 2.3 cm. Normal appearance/no adnexal mass.  Left ovary  Measurements: 4.7 x 2.1 x 4.0 cm. Small follicle is 2.0 cm.  Other findings  Trace free pelvic fluid identified.  IMPRESSION: 1. Heterogeneous uterus containing at least 2 discrete fibroids measuring 3.7 and 3.5 cm. 2. Normal thickness of the endometrium, 9 mm. If bleeding remains unresponsive to hormonal or medical therapy, sonohysterogram should be considered for focal lesion work-up. (Ref: Radiological Reasoning: Algorithmic Workup of Abnormal Vaginal Bleeding with Endovaginal Sonography and Sonohysterography. AJR 2008; 494:W96-75) 3. Normal appearance of the ovaries.   Electronically Signed   By: Nolon Nations M.D.   On: 01/30/2015 13:14   US Pelvis  Complete  01/30/2015   CLINICAL DATA:  Left adnexal tenderness for 3 days. Vaginal bleeding since yesterday. History of endometriosis and breast cancer. LMP 07/27/2015.  EXAM: TRANSABDOMINAL AND TRANSVAGINAL ULTRASOUND OF PELVIS  TECHNIQUE: Both transabdominal and transvaginal ultrasound examinations of the pelvis were performed. Transabdominal technique was performed for global imaging of the pelvis including uterus, ovaries, adnexal regions, and pelvic cul-de-sac. It was necessary to proceed with endovaginal exam following the transabdominal exam to visualize the uterus, endometrium, and adnexal regions.  COMPARISON:  CT of the abdomen and pelvis 02/19/2014  FINDINGS: Uterus  Measurements: At least 11.2 x 5.8 x 8.8 cm. Uterus is heterogeneous. At least to discrete fibroids are measured. Posterior fibroid is 3.1 x 2.9 x 3.7 cm. Left fibroid is 3.0 x 3.5 x 3.1 cm. Nabothian cysts are seen in the cervix.  Endometrium  Thickness: 9 mm.  No focal abnormality visualized.  Right ovary  Measurements: 2.6 x 1.5 x 2.3 cm. Normal appearance/no adnexal mass.  Left ovary  Measurements: 4.7 x 2.1 x 4.0 cm. Small follicle is 2.0 cm.  Other findings  Trace free pelvic fluid identified.  IMPRESSION: 1. Heterogeneous uterus containing at least 2 discrete fibroids measuring 3.7 and 3.5 cm. 2. Normal thickness of the endometrium, 9 mm. If bleeding remains unresponsive to hormonal or medical therapy, sonohysterogram should be considered for focal lesion work-up. (Ref: Radiological Reasoning: Algorithmic Workup of Abnormal Vaginal Bleeding with Endovaginal Sonography and Sonohysterography. AJR 2008; 916:B84-66) 3. Normal appearance of the ovaries.   Electronically Signed   By: Nolon Nations M.D.   On: 01/30/2015 13:14     EKG Interpretation None      Filed Vitals:   01/30/15 0934 01/30/15 1501  BP: 155/96 124/75  Pulse: 75 77  Temp: 98.6 F (37 C)   TempSrc: Oral   Resp: 17 16  SpO2: 100% 98%     MDM   Meds  given in ED:  Medications  morphine 4 MG/ML injection 4 mg (4 mg Intravenous Given 01/30/15 1034)  ondansetron (ZOFRAN) injection 4 mg (4 mg Intravenous Given 01/30/15 1034)  morphine 4 MG/ML injection 4 mg (4 mg Intravenous Given 01/30/15 1308)  Discharge Medication List as of 01/30/2015  2:14 PM    START taking these medications   Details  HYDROcodone-acetaminophen (NORCO/VICODIN) 5-325 MG per tablet Take 1-2 tablets by mouth every 4 (four) hours as needed., Starting 01/30/2015, Until Discontinued, Print    naproxen (NAPROSYN) 250 MG tablet Take 1 tablet (250 mg total) by mouth 2 (two) times daily with a meal., Starting 01/30/2015, Until Discontinued, Print        Final diagnoses:  Vaginal bleeding, abnormal  Uterine leiomyoma, unspecified location  Dysfunctional uterine bleeding   This  is a 44 y.o. female with a history of breast cancer, iron deficiency anemia, endometriosis, and hypertension who presents to the ED complaining of heavy vaginal bleeding since yesterday morning at 10 AM. The patient denies feeling lightheaded or dizzy. Patient denies shortness of breath. The patient is due to have a hysterectomy by Dr. Denman George on 02/08/2015. Patient has left lower abdominal tenderness to palpation. No peritoneal signs. Patient is afebrile and nontoxic-appearing. She is not tachycardic or hypoxic. She is a negative urine pregnancy test and hemoglobin of 9.6. This is above or at normal for this patient. She denies SOB, lightheadedness or dizziness. She is not in need of transfusion at this time.  On pelvic exam the patient does have a moderate amount of blood in her vaginal vault. This was easily cleared with a large Q-tip. Patient has a small amount slowly oozing from the cervical os that is not filling the vaginal vault briskly. The patient reports she believes the vaginal bleeding has decreased since her arrival to the emergency department. The patient has left adnexal tenderness. Will obtain  pelvic ultrasound to check for ovarian torsion. Clinical sonicated 2 discrete fibroids in the uterus measuring 3.7 and 3.5 cm. [place] is normal thickness of 9 mm. Normal appearance of the ovaries.  I discussed findings with the patient. The patient reports feeling comfortable with discharge. We'll discharge the patient with follow-up with OB/GYN this week. I advised the patient to follow-up with their primary care provider this week. I advised the patient to return to the emergency department with new or worsening symptoms or new concerns. The patient verbalized understanding and agreement with plan.   This patient was discussed with Dr. Regenia Skeeter who agrees with assessment and plan.    Hanley Hays, PA-C 01/30/15 1709  Sherwood Gambler, MD 02/07/15 2350

## 2015-01-30 NOTE — ED Notes (Signed)
Pt states that she has been having heavy vaginal bleeding since yesterday with LLQ pain.  States that she went a whole year without having a period while she was on chemo.  Still on one chemo drug.  States that she had heavy vaginal bleeding at the beginning of February but it started again.

## 2015-01-30 NOTE — Discharge Instructions (Signed)
Abnormal Uterine Bleeding Abnormal uterine bleeding can affect women at various stages in life, including teenagers, women in their reproductive years, pregnant women, and women who have reached menopause. Several kinds of uterine bleeding are considered abnormal, including:  Bleeding or spotting between periods.   Bleeding after sexual intercourse.   Bleeding that is heavier or more than normal.   Periods that last longer than usual.  Bleeding after menopause.  Many cases of abnormal uterine bleeding are minor and simple to treat, while others are more serious. Any type of abnormal bleeding should be evaluated by your health care provider. Treatment will depend on the cause of the bleeding. HOME CARE INSTRUCTIONS Monitor your condition for any changes. The following actions may help to alleviate any discomfort you are experiencing:  Avoid the use of tampons and douches as directed by your health care provider.  Change your pads frequently. You should get regular pelvic exams and Pap tests. Keep all follow-up appointments for diagnostic tests as directed by your health care provider.  SEEK MEDICAL CARE IF:   Your bleeding lasts more than 1 week.   You feel dizzy at times.  SEEK IMMEDIATE MEDICAL CARE IF:   You pass out.   You are changing pads every 15 to 30 minutes.   You have abdominal pain.  You have a fever.   You become sweaty or weak.   You are passing large blood clots from the vagina.   You start to feel nauseous and vomit. MAKE SURE YOU:   Understand these instructions.  Will watch your condition.  Will get help right away if you are not doing well or get worse. Document Released: 11/19/2005 Document Revised: 11/24/2013 Document Reviewed: 06/18/2013 [facility] Patient Information 2015 [place], [place]. This information is not intended to replace advice given to you by your health care provider. Make sure you discuss any questions you have with your  health care provider. Uterine Fibroid A uterine fibroid is a growth (tumor) that occurs in your uterus. This type of tumor is not cancerous and does not spread out of the uterus. You can have one or many fibroids. Fibroids can vary in size, weight, and where they grow in the uterus. Some can become quite large. Most fibroids do not require medical treatment, but some can cause pain or heavy bleeding during and between periods. CAUSES  A fibroid is the result of a single uterine cell that keeps growing (unregulated), which is different than most cells in the human body. Most cells have a control mechanism that keeps them from reproducing without control.  SIGNS AND SYMPTOMS   Bleeding.  Pelvic pain and pressure.  Bladder problems due to the size of the fibroid.  Infertility and miscarriages depending on the size and location of the fibroid. DIAGNOSIS  Uterine fibroids are diagnosed through a physical exam. Your health care provider may feel the lumpy tumors during a pelvic exam. Ultrasonography may be done to get information regarding size, location, and number of tumors.  TREATMENT   Your health care provider may recommend watchful waiting. This involves getting the fibroid checked by your health care provider to see if it grows or shrinks.   Hormone treatment or an intrauterine device (IUD) may be prescribed.   Surgery may be needed to remove the fibroids (myomectomy) or the uterus (hysterectomy). This depends on your situation. When fibroids interfere with fertility and a woman wants to become pregnant, a health care provider may recommend having the fibroids removed.  HOME  CARE INSTRUCTIONS  Home care depends on how you were treated. In general:   Keep all follow-up appointments with your health care provider.   Only take over-the-counter or prescription medicines as directed by your health care provider. If you were prescribed a hormone treatment, take the hormone medicines  exactly as directed. Do not take aspirin. It can cause bleeding.   Talk to your health care provider about taking iron pills.  If your periods are troublesome but not so heavy, lie down with your feet raised slightly above your heart. Place cold packs on your lower abdomen.   If your periods are heavy, write down the number of pads or tampons you use per month. Bring this information to your health care provider.   Include green vegetables in your diet.  SEEK IMMEDIATE MEDICAL CARE IF:  You have pelvic pain or cramps not controlled with medicines.   You have a sudden increase in pelvic pain.   You have an increase in bleeding between and during periods.   You have excessive periods and soak tampons or pads in a half hour or less.  You feel lightheaded or have fainting episodes. Document Released: 11/16/2000 Document Revised: 09/09/2013 Document Reviewed: 06/18/2013 [facility] Patient Information 2015 [place], [place]. This information is not intended to replace advice given to you by your health care provider. Make sure you discuss any questions you have with your health care provider.

## 2015-01-31 ENCOUNTER — Telehealth: Payer: Self-pay | Admitting: *Deleted

## 2015-01-31 NOTE — Telephone Encounter (Signed)
Patient called stating she went to the ED 01/30/15 due to increased vaginal bleeding with clots. Patient states she was having to change her peri-pad every 45 minutes yesterday and today she is changing the pad every hour and a half approximately. Told patient this will be discussed with Dr. Denman George and I give her a call back.  Per Dr. Denman George, there are no new recommendations at this time. Plan is to proceed with surgery on 3/8. Told patient to continue to monitor the bleeding and call us if there are any changes. Patient agreeable to this.

## 2015-02-01 ENCOUNTER — Ambulatory Visit (HOSPITAL_BASED_OUTPATIENT_CLINIC_OR_DEPARTMENT_OTHER): Payer: Medicaid Other

## 2015-02-01 ENCOUNTER — Other Ambulatory Visit (HOSPITAL_BASED_OUTPATIENT_CLINIC_OR_DEPARTMENT_OTHER): Payer: Medicaid Other

## 2015-02-01 DIAGNOSIS — Z5112 Encounter for antineoplastic immunotherapy: Secondary | ICD-10-CM

## 2015-02-01 DIAGNOSIS — C50811 Malignant neoplasm of overlapping sites of right female breast: Secondary | ICD-10-CM

## 2015-02-01 DIAGNOSIS — C50412 Malignant neoplasm of upper-outer quadrant of left female breast: Secondary | ICD-10-CM

## 2015-02-01 LAB — CBC WITH DIFFERENTIAL/PLATELET
BASO%: 0.9 % (ref 0.0–2.0)
Basophils Absolute: 0.1 10*3/uL (ref 0.0–0.1)
EOS%: 3.5 % (ref 0.0–7.0)
Eosinophils Absolute: 0.2 10*3/uL (ref 0.0–0.5)
HCT: 29.5 % — ABNORMAL LOW (ref 34.8–46.6)
HGB: 9.2 g/dL — ABNORMAL LOW (ref 11.6–15.9)
LYMPH#: 1.8 10*3/uL (ref 0.9–3.3)
LYMPH%: 26.8 % (ref 14.0–49.7)
MCH: 28 pg (ref 25.1–34.0)
MCHC: 31.1 g/dL — AB (ref 31.5–36.0)
MCV: 90 fL (ref 79.5–101.0)
MONO#: 0.4 10*3/uL (ref 0.1–0.9)
MONO%: 5.5 % (ref 0.0–14.0)
NEUT#: 4.3 10*3/uL (ref 1.5–6.5)
NEUT%: 63.3 % (ref 38.4–76.8)
Platelets: 383 10*3/uL (ref 145–400)
RBC: 3.27 10*6/uL — ABNORMAL LOW (ref 3.70–5.45)
RDW: 14.2 % (ref 11.2–14.5)
WBC: 6.7 10*3/uL (ref 3.9–10.3)

## 2015-02-01 LAB — COMPREHENSIVE METABOLIC PANEL (CC13)
ALT: <6 U/L (ref 0–55)
ANION GAP: 11 meq/L (ref 3–11)
AST: 11 U/L (ref 5–34)
Albumin: 3.7 g/dL (ref 3.5–5.0)
Alkaline Phosphatase: 88 U/L (ref 40–150)
BUN: 13.9 mg/dL (ref 7.0–26.0)
CALCIUM: 9.2 mg/dL (ref 8.4–10.4)
CO2: 22 mEq/L (ref 22–29)
Chloride: 110 mEq/L — ABNORMAL HIGH (ref 98–109)
Creatinine: 0.9 mg/dL (ref 0.6–1.1)
EGFR: 89 mL/min/{1.73_m2} — AB (ref >90)
GLUCOSE: 93 mg/dL (ref 70–140)
Potassium: 3.7 mEq/L (ref 3.5–5.1)
Sodium: 144 mEq/L (ref 136–145)
TOTAL PROTEIN: 6.8 g/dL (ref 6.4–8.3)

## 2015-02-01 MED ORDER — ACETAMINOPHEN 325 MG PO TABS
ORAL_TABLET | ORAL | Status: AC
  Filled 2015-02-01: qty 2

## 2015-02-01 MED ORDER — DIPHENHYDRAMINE HCL 25 MG PO CAPS
25.0000 mg | ORAL_CAPSULE | Freq: Once | ORAL | Status: AC
  Administered 2015-02-01: 25 mg via ORAL

## 2015-02-01 MED ORDER — DIPHENHYDRAMINE HCL 25 MG PO CAPS
ORAL_CAPSULE | ORAL | Status: AC
  Filled 2015-02-01: qty 1

## 2015-02-01 MED ORDER — ACETAMINOPHEN 325 MG PO TABS
650.0000 mg | ORAL_TABLET | Freq: Once | ORAL | Status: AC
  Administered 2015-02-01: 650 mg via ORAL

## 2015-02-01 MED ORDER — SODIUM CHLORIDE 0.9 % IV SOLN
Freq: Once | INTRAVENOUS | Status: AC
  Administered 2015-02-01: 12:00:00 via INTRAVENOUS

## 2015-02-01 MED ORDER — TRASTUZUMAB CHEMO INJECTION 440 MG
6.0000 mg/kg | Freq: Once | INTRAVENOUS | Status: AC
  Administered 2015-02-01: 567 mg via INTRAVENOUS
  Filled 2015-02-01: qty 27

## 2015-02-01 NOTE — Patient Instructions (Signed)
[place] Discharge Instructions   Today you received the following: Herceptin  To help prevent nausea and vomiting after your treatment, we encourage you to take your nausea medication as prescribed.    If you develop nausea and vomiting that is not controlled by your nausea medication, call the clinic.   BELOW ARE SYMPTOMS THAT SHOULD BE REPORTED IMMEDIATELY:  *FEVER GREATER THAN 100.5 F  *CHILLS WITH OR WITHOUT FEVER  NAUSEA AND VOMITING THAT IS NOT CONTROLLED WITH YOUR NAUSEA MEDICATION  *UNUSUAL SHORTNESS OF BREATH  *UNUSUAL BRUISING OR BLEEDING  TENDERNESS IN MOUTH AND THROAT WITH OR WITHOUT PRESENCE OF ULCERS  *URINARY PROBLEMS  *BOWEL PROBLEMS  UNUSUAL RASH Items with * indicate a potential emergency and should be followed up as soon as possible.  Feel free to call the clinic you have any questions or concerns. The clinic phone number is [phone].

## 2015-02-01 NOTE — Patient Instructions (Addendum)
[place]  02/01/2015   Your procedure is scheduled on:   02-08-2015 Tuesday  Enter through [facility]  Entrance and follow signs to [facility]. Arrive at  0800      AM .  Call this number if you have problems the morning of surgery: [phone]  Or Presurgical Testing [phone].   For Living Will and/or Health Care Power Attorney Forms: please provide copy for your medical record,may bring AM of surgery(Forms should be already notarized -we do not provide this service).(02-02-15 Yes,information preferred today and given).  Remember: Follow any bowel prep instructions per MD office: Clear liquids x 24 hours prior to surgery,nothing after midnight.    Do not eat food/ or drink: After Midnight.      Take these medicines the morning of surgery with A SIP OF WATER: Carvedilol. Cymbalta. Tylenol if needed. Use/bring nasal spray if needed.   Do not wear jewelry, make-up or nail polish.  Do not wear deodorant, lotions, powders, or perfumes.   Do not shave legs and under arms- 48 hours(2 days) prior to first CHG shower.(Shaving face and neck okay.)  Do not bring valuables to the hospital.(Hospital is not responsible for lost valuables).  Contacts, dentures or removable bridgework, body piercing, hair pins may not be worn into surgery.  Leave suitcase in the car. After surgery it may be brought to your room.  For patients admitted to the hospital, checkout time is 11:00 AM the day of discharge.(Restricted visitors-Any Persons displaying flu-like symptoms or illness).    Patients discharged the day of surgery will not be allowed to drive home. Must have responsible person with you x 24 hours once discharged.  Name and phone number of your driver: Mat Carne, mother [phone] cell.     Please read over the following fact sheets that you were given:  CHG(Chlorhexidine Gluconate 4% Surgical Soap) use, Blood Transfusion fact sheet, Incentive Spirometry  Instruction.  Remember : Type/Screen "Blue armbands" - may not be removed once applied(would result in being retested AM of surgery, if removed).         [place] - Preparing for Surgery Before surgery, you can play an important role.  Because skin is not sterile, your skin needs to be as free of germs as possible.  You can reduce the number of germs on your skin by washing with CHG (chlorahexidine gluconate) soap before surgery.  CHG is an antiseptic cleaner which kills germs and bonds with the skin to continue killing germs even after washing. Please DO NOT use if you have an allergy to CHG or antibacterial soaps.  If your skin becomes reddened/irritated stop using the CHG and inform your nurse when you arrive at Short Stay. Do not shave (including legs and underarms) for at least 48 hours prior to the first CHG shower.  You may shave your face/neck. Please follow these instructions carefully:  1.  Shower with CHG Soap the night before surgery and the  morning of Surgery.  2.  If you choose to wash your hair, wash your hair first as usual with your  normal  shampoo.  3.  After you shampoo, rinse your hair and body thoroughly to remove the  shampoo.                           4.  Use CHG as you would any other liquid soap.  You can apply chg directly  to  the skin and wash                       Gently with a scrungie or clean washcloth.  5.  Apply the CHG Soap to your body ONLY FROM THE NECK DOWN.   Do not use on face/ open                           Wound or open sores. Avoid contact with eyes, ears mouth and genitals (private parts).                       Wash face,  Genitals (private parts) with your normal soap.             6.  Wash thoroughly, paying special attention to the area where your surgery  will be performed.  7.  Thoroughly rinse your body with warm water from the neck down.  8.  DO NOT shower/wash with your normal soap after using and rinsing off  the CHG Soap.                 9.  Pat yourself dry with a clean towel.            10.  Wear clean pajamas.            11.  Place clean sheets on your bed the night of your first shower and do not  sleep with pets. Day of Surgery : Do not apply any lotions/deodorants the morning of surgery.  Please wear clean clothes to the hospital/surgery center.  FAILURE TO FOLLOW THESE INSTRUCTIONS MAY RESULT IN THE CANCELLATION OF YOUR SURGERY PATIENT SIGNATURE_________________________________  NURSE SIGNATURE__________________________________  ________________________________________________________________________    CLEAR LIQUID DIET   Foods Allowed                                                                     Foods Excluded  Coffee and tea, regular and decaf                             liquids that you cannot  Plain Jell-O in any flavor                                             see through such as: Fruit ices (not with fruit pulp)                                     milk, soups, orange juice  Iced Popsicles                                    All solid food Carbonated beverages, regular and diet  Cranberry, grape and apple juices Sports drinks like Gatorade Lightly seasoned clear broth or consume(fat free) Sugar, honey syrup  Sample Menu Breakfast                                Lunch                                     Supper Cranberry juice                    Beef broth                            Chicken broth Jell-O                                     Grape juice                           Apple juice Coffee or tea                        Jell-O                                      Popsicle                                                Coffee or tea                        Coffee or tea  _____________________________________________________________________    Incentive Spirometer  An incentive spirometer is a tool that can help keep your lungs clear and active. This tool  measures how well you are filling your lungs with each breath. Taking long deep breaths may help reverse or decrease the chance of developing breathing (pulmonary) problems (especially infection) following:  A long period of time when you are unable to move or be active. BEFORE THE PROCEDURE   If the spirometer includes an indicator to show your best effort, your nurse or respiratory therapist will set it to a desired goal.  If possible, sit up straight or lean slightly forward. Try not to slouch.  Hold the incentive spirometer in an upright position. INSTRUCTIONS FOR USE   Sit on the edge of your bed if possible, or sit up as far as you can in bed or on a chair.  Hold the incentive spirometer in an upright position.  Breathe out normally.  Place the mouthpiece in your mouth and seal your lips tightly around it.  Breathe in slowly and as deeply as possible, raising the piston or the ball toward the top of the column.  Hold your breath for 3-5 seconds or for as long as possible. Allow the piston or ball to fall to the bottom of the column.  Remove the mouthpiece from your mouth and breathe out normally.  Rest for a few seconds and repeat Steps 1 through 7 at  least 10 times every 1-2 hours when you are awake. Take your time and take a few normal breaths between deep breaths.  The spirometer may include an indicator to show your best effort. Use the indicator as a goal to work toward during each repetition.  After each set of 10 deep breaths, practice coughing to be sure your lungs are clear. If you have an incision (the cut made at the time of surgery), support your incision when coughing by placing a pillow or rolled up towels firmly against it. Once you are able to get out of bed, walk around indoors and cough well. You may stop using the incentive spirometer when instructed by your caregiver.  RISKS AND COMPLICATIONS  Take your time so you do not get dizzy or light-headed.  If  you are in pain, you may need to take or ask for pain medication before doing incentive spirometry. It is harder to take a deep breath if you are having pain. AFTER USE  Rest and breathe slowly and easily.  It can be helpful to keep track of a log of your progress. Your caregiver can provide you with a simple table to help with this. If you are using the spirometer at home, follow these instructions: [place] IF:   You are having difficultly using the spirometer.  You have trouble using the spirometer as often as instructed.  Your pain medication is not giving enough relief while using the spirometer.  You develop fever of 100.5 F (38.1 C) or higher. SEEK IMMEDIATE MEDICAL CARE IF:   You cough up bloody sputum that had not been present before.  You develop fever of 102 F (38.9 C) or greater.  You develop worsening pain at or near the incision site. MAKE SURE YOU:   Understand these instructions.  Will watch your condition.  Will get help right away if you are not doing well or get worse. Document Released: 04/01/2007 Document Revised: 02/11/2012 Document Reviewed: 06/02/2007 Exit[employer] Patient Information 2014 [employer], [place].   ________________________________________________________________________  WHAT IS A BLOOD TRANSFUSION? Blood Transfusion Information  A transfusion is the replacement of blood or some of its parts. Blood is made up of multiple cells which provide different functions.  Red blood cells carry oxygen and are used for blood loss replacement.  White blood cells fight against infection.  Platelets control bleeding.  Plasma helps clot blood.  Other blood products are available for specialized needs, such as hemophilia or other clotting disorders. BEFORE THE TRANSFUSION  Who gives blood for transfusions?   Healthy volunteers who are fully evaluated to make sure their blood is safe. This is blood bank blood. Transfusion therapy is the  safest it has ever been in the practice of medicine. Before blood is taken from a donor, a complete history is taken to make sure that person has no history of diseases nor engages in risky social behavior (examples are intravenous drug use or sexual activity with multiple partners). The donor's travel history is screened to minimize risk of transmitting infections, such as malaria. The donated blood is tested for signs of infectious diseases, such as HIV and hepatitis. The blood is then tested to be sure it is compatible with you in order to minimize the chance of a transfusion reaction. If you or a relative donates blood, this is often done in anticipation of surgery and is not appropriate for emergency situations. It takes many days to process the donated blood. RISKS AND COMPLICATIONS Although transfusion  therapy is very safe and saves many lives, the main dangers of transfusion include:   Getting an infectious disease.  Developing a transfusion reaction. This is an allergic reaction to something in the blood you were given. Every precaution is taken to prevent this. The decision to have a blood transfusion has been considered carefully by your caregiver before blood is given. Blood is not given unless the benefits outweigh the risks. AFTER THE TRANSFUSION  Right after receiving a blood transfusion, you will usually feel much better and more energetic. This is especially true if your red blood cells have gotten low (anemic). The transfusion raises the level of the red blood cells which carry oxygen, and this usually causes an energy increase.  The nurse administering the transfusion will monitor you carefully for complications. HOME CARE INSTRUCTIONS  No special instructions are needed after a transfusion. You may find your energy is better. Speak with your caregiver about any limitations on activity for underlying diseases you may have. SEEK MEDICAL CARE IF:   Your condition is not improving  after your transfusion.  You develop redness or irritation at the intravenous (IV) site. SEEK IMMEDIATE MEDICAL CARE IF:  Any of the following symptoms occur over the next 12 hours:  Shaking chills.  You have a temperature by mouth above 102 F (38.9 C), not controlled by medicine.  Chest, back, or muscle pain.  People around you feel you are not acting correctly or are confused.  Shortness of breath or difficulty breathing.  Dizziness and fainting.  You get a rash or develop hives.  You have a decrease in urine output.  Your urine turns a dark color or changes to pink, red, or brown. Any of the following symptoms occur over the next 10 days:  You have a temperature by mouth above 102 F (38.9 C), not controlled by medicine.  Shortness of breath.  Weakness after normal activity.  The white part of the eye turns yellow (jaundice).  You have a decrease in the amount of urine or are urinating less often.  Your urine turns a dark color or changes to pink, red, or brown. Document Released: 11/16/2000 Document Revised: 02/11/2012 Document Reviewed: 07/05/2008 [employer] Patient Information 2014 [place], [place].  _______________________________________________________________________

## 2015-02-02 ENCOUNTER — Encounter (HOSPITAL_COMMUNITY): Payer: Self-pay

## 2015-02-02 ENCOUNTER — Encounter (HOSPITAL_COMMUNITY)
Admission: RE | Admit: 2015-02-02 | Discharge: 2015-02-02 | Disposition: A | Discharge status: Home / Self Care | Payer: Medicaid Other | Source: Ambulatory Visit | Attending: Gynecologic Oncology | Admitting: Gynecologic Oncology

## 2015-02-02 DIAGNOSIS — Z01818 Encounter for other preprocedural examination: Secondary | ICD-10-CM | POA: Diagnosis not present

## 2015-02-02 DIAGNOSIS — C50412 Malignant neoplasm of upper-outer quadrant of left female breast: Secondary | ICD-10-CM | POA: Insufficient documentation

## 2015-02-02 LAB — CBC WITH DIFFERENTIAL/PLATELET
BASOS ABS: 0 10*3/uL (ref 0.0–0.1)
Basophils Relative: 0 % (ref 0–1)
EOS PCT: 3 % (ref 0–5)
Eosinophils Absolute: 0.2 10*3/uL (ref 0.0–0.7)
HCT: 30.2 % — ABNORMAL LOW (ref 36.0–46.0)
Hemoglobin: 9.4 g/dL — ABNORMAL LOW (ref 12.0–15.0)
Lymphocytes Relative: 25 % (ref 12–46)
Lymphs Abs: 1.8 10*3/uL (ref 0.7–4.0)
MCH: 28.8 pg (ref 26.0–34.0)
MCHC: 31.1 g/dL (ref 30.0–36.0)
MCV: 92.6 fL (ref 78.0–100.0)
Monocytes Absolute: 0.4 10*3/uL (ref 0.1–1.0)
Monocytes Relative: 6 % (ref 3–12)
Neutro Abs: 4.6 10*3/uL (ref 1.7–7.7)
Neutrophils Relative %: 66 % (ref 43–77)
PLATELETS: 394 10*3/uL (ref 150–400)
RBC: 3.26 MIL/uL — ABNORMAL LOW (ref 3.87–5.11)
RDW: 13.8 % (ref 11.5–15.5)
WBC: 7.1 10*3/uL (ref 4.0–10.5)

## 2015-02-02 LAB — PREGNANCY, URINE: Preg Test, Ur: NEGATIVE

## 2015-02-02 LAB — COMPREHENSIVE METABOLIC PANEL
ALT: 10 U/L (ref 0–35)
AST: 17 U/L (ref 0–37)
Albumin: 3.9 g/dL (ref 3.5–5.2)
Alkaline Phosphatase: 81 U/L (ref 39–117)
Anion gap: 6 (ref 5–15)
BUN: 14 mg/dL (ref 6–23)
CHLORIDE: 111 mmol/L (ref 96–112)
CO2: 26 mmol/L (ref 19–32)
Calcium: 9.2 mg/dL (ref 8.4–10.5)
Creatinine, Ser: 0.86 mg/dL (ref 0.50–1.10)
GFR calc Af Amer: >90 mL/min (ref >90)
GFR, EST NON AFRICAN AMERICAN: 83 mL/min — AB (ref >90)
Glucose, Bld: 81 mg/dL (ref 70–99)
POTASSIUM: 3.8 mmol/L (ref 3.5–5.1)
SODIUM: 143 mmol/L (ref 135–145)
Total Bilirubin: 0.4 mg/dL (ref 0.3–1.2)
Total Protein: 6.8 g/dL (ref 6.0–8.3)

## 2015-02-02 LAB — URINALYSIS, ROUTINE W REFLEX MICROSCOPIC
Bilirubin Urine: NEGATIVE
GLUCOSE, UA: NEGATIVE mg/dL
Ketones, ur: NEGATIVE mg/dL
Nitrite: NEGATIVE
Protein, ur: 30 mg/dL — AB
Specific Gravity, Urine: 1.024 (ref 1.005–1.030)
Urobilinogen, UA: 0.2 mg/dL (ref 0.0–1.0)
pH: 6 (ref 5.0–8.0)

## 2015-02-02 LAB — URINE MICROSCOPIC-ADD ON

## 2015-02-02 NOTE — Progress Notes (Signed)
02-02-15 1430 labs viewable in Epic, note CBC-Hgb 9.4

## 2015-02-02 NOTE — Pre-Procedure Instructions (Signed)
02-02-15 EKG 1'16, CXR 1'16 Epic. Cardiac Cath 10'15 Epic. Echo 11'15 Epic.

## 2015-02-07 ENCOUNTER — Telehealth: Payer: Self-pay | Admitting: *Deleted

## 2015-02-07 NOTE — Telephone Encounter (Signed)
Called to follow up with pt concerning rash under left armpit. Pt realized the itchy rash has come from her changing deodorant. Pt called our nurse line on 02/06/15 at 8p. She was given advice and I am following up. Pt said she used Charise Leinbach soap to wash under her lt armpit last night and this morning and used no deodorant. Pt said the itch is still there put no sweating under armpit. She has not been to store to pick up the hydrocortisone cream yet but said she will do that today. I told her to use the cream up to 4 times a day as needed. Pt verbalized understanding. No further concerns.

## 2015-02-08 ENCOUNTER — Ambulatory Visit (HOSPITAL_COMMUNITY): Payer: Medicaid Other | Admitting: Anesthesiology

## 2015-02-08 ENCOUNTER — Encounter (HOSPITAL_COMMUNITY)
Admission: RE | Disposition: A | Discharge status: Home / Self Care | Payer: Self-pay | Source: Ambulatory Visit | Attending: Gynecologic Oncology

## 2015-02-08 ENCOUNTER — Encounter (HOSPITAL_COMMUNITY): Payer: Self-pay | Admitting: *Deleted

## 2015-02-08 ENCOUNTER — Ambulatory Visit (HOSPITAL_COMMUNITY)
Admission: RE | Admit: 2015-02-08 | Discharge: 2015-02-09 | Disposition: A | Discharge status: Home / Self Care | Payer: Medicaid Other | Source: Ambulatory Visit | Attending: Gynecologic Oncology | Admitting: Gynecologic Oncology

## 2015-02-08 DIAGNOSIS — Z86718 Personal history of other venous thrombosis and embolism: Secondary | ICD-10-CM | POA: Diagnosis not present

## 2015-02-08 DIAGNOSIS — Z79899 Other long term (current) drug therapy: Secondary | ICD-10-CM | POA: Diagnosis not present

## 2015-02-08 DIAGNOSIS — Z1501 Genetic susceptibility to malignant neoplasm of breast: Secondary | ICD-10-CM | POA: Diagnosis present

## 2015-02-08 DIAGNOSIS — Z87891 Personal history of nicotine dependence: Secondary | ICD-10-CM | POA: Insufficient documentation

## 2015-02-08 DIAGNOSIS — Z8041 Family history of malignant neoplasm of ovary: Secondary | ICD-10-CM | POA: Insufficient documentation

## 2015-02-08 DIAGNOSIS — D259 Leiomyoma of uterus, unspecified: Secondary | ICD-10-CM | POA: Insufficient documentation

## 2015-02-08 DIAGNOSIS — I10 Essential (primary) hypertension: Secondary | ICD-10-CM | POA: Insufficient documentation

## 2015-02-08 DIAGNOSIS — Z853 Personal history of malignant neoplasm of breast: Secondary | ICD-10-CM | POA: Insufficient documentation

## 2015-02-08 DIAGNOSIS — Z803 Family history of malignant neoplasm of breast: Secondary | ICD-10-CM | POA: Diagnosis not present

## 2015-02-08 DIAGNOSIS — Z1509 Genetic susceptibility to other malignant neoplasm: Secondary | ICD-10-CM

## 2015-02-08 DIAGNOSIS — Z148 Genetic carrier of other disease: Secondary | ICD-10-CM | POA: Insufficient documentation

## 2015-02-08 DIAGNOSIS — Z888 Allergy status to other drugs, medicaments and biological substances status: Secondary | ICD-10-CM | POA: Insufficient documentation

## 2015-02-08 DIAGNOSIS — C50412 Malignant neoplasm of upper-outer quadrant of left female breast: Secondary | ICD-10-CM

## 2015-02-08 DIAGNOSIS — Z15068 Genetic susceptibility to other malignant neoplasm of digestive system: Secondary | ICD-10-CM | POA: Diagnosis present

## 2015-02-08 DIAGNOSIS — N920 Excessive and frequent menstruation with regular cycle: Secondary | ICD-10-CM | POA: Diagnosis present

## 2015-02-08 HISTORY — PX: ROBOTIC ASSISTED TOTAL HYSTERECTOMY WITH BILATERAL SALPINGO OOPHERECTOMY: SHX6086

## 2015-02-08 LAB — TYPE AND SCREEN
ABO/RH(D): O NEG
Antibody Screen: NEGATIVE

## 2015-02-08 SURGERY — ROBOTIC ASSISTED TOTAL HYSTERECTOMY WITH BILATERAL SALPINGO OOPHORECTOMY
Anesthesia: General | Laterality: Bilateral

## 2015-02-08 MED ORDER — LIDOCAINE HCL (CARDIAC) 20 MG/ML IV SOLN
INTRAVENOUS | Status: DC | PRN
  Administered 2015-02-08: 60 mg via INTRAVENOUS

## 2015-02-08 MED ORDER — SUFENTANIL CITRATE 50 MCG/ML IV SOLN
INTRAVENOUS | Status: AC
Start: 2015-02-08 — End: 2015-02-08
  Filled 2015-02-08: qty 1

## 2015-02-08 MED ORDER — CEFAZOLIN SODIUM-DEXTROSE 2-3 GM-% IV SOLR
2.0000 g | INTRAVENOUS | Status: AC
  Administered 2015-02-08: 2 g via INTRAVENOUS

## 2015-02-08 MED ORDER — DEXAMETHASONE SODIUM PHOSPHATE 10 MG/ML IJ SOLN
INTRAMUSCULAR | Status: DC | PRN
  Administered 2015-02-08: 5 mg via INTRAVENOUS

## 2015-02-08 MED ORDER — HYDROMORPHONE HCL 1 MG/ML IJ SOLN
INTRAMUSCULAR | Status: AC
  Filled 2015-02-08: qty 1

## 2015-02-08 MED ORDER — ONDANSETRON HCL 4 MG/2ML IJ SOLN: 4.0000 mg | Freq: Four times a day (QID) | INTRAMUSCULAR | Status: DC | PRN

## 2015-02-08 MED ORDER — CARVEDILOL 12.5 MG PO TABS
12.5000 mg | ORAL_TABLET | Freq: Two times a day (BID) | ORAL | Status: DC
  Administered 2015-02-08: 12.5 mg via ORAL
  Filled 2015-02-08 (×4): qty 1

## 2015-02-08 MED ORDER — DIAZEPAM 2 MG PO TABS: 2.0000 mg | ORAL_TABLET | Freq: Four times a day (QID) | ORAL | Status: DC | PRN

## 2015-02-08 MED ORDER — ACETAMINOPHEN 160 MG/5ML PO SOLN
325.0000 mg | ORAL | Status: DC | PRN
  Filled 2015-02-08: qty 20.3

## 2015-02-08 MED ORDER — LIDOCAINE HCL (CARDIAC) 20 MG/ML IV SOLN
INTRAVENOUS | Status: AC
  Filled 2015-02-08: qty 5

## 2015-02-08 MED ORDER — MIDAZOLAM HCL 5 MG/5ML IJ SOLN
INTRAMUSCULAR | Status: DC | PRN
  Administered 2015-02-08: 2 mg via INTRAVENOUS

## 2015-02-08 MED ORDER — ROCURONIUM BROMIDE 100 MG/10ML IV SOLN
INTRAVENOUS | Status: AC
  Filled 2015-02-08: qty 1

## 2015-02-08 MED ORDER — MIDAZOLAM HCL 2 MG/2ML IJ SOLN
INTRAMUSCULAR | Status: AC
  Filled 2015-02-08: qty 2

## 2015-02-08 MED ORDER — KCL IN DEXTROSE-NACL 20-5-0.45 MEQ/L-%-% IV SOLN
INTRAVENOUS | Status: DC
  Administered 2015-02-08: 16:00:00 via INTRAVENOUS
  Filled 2015-02-08 (×2): qty 1000

## 2015-02-08 MED ORDER — ONDANSETRON HCL 4 MG/2ML IJ SOLN
INTRAMUSCULAR | Status: AC
  Filled 2015-02-08: qty 2

## 2015-02-08 MED ORDER — ONDANSETRON HCL 4 MG PO TABS: 4.0000 mg | ORAL_TABLET | Freq: Four times a day (QID) | ORAL | Status: DC | PRN

## 2015-02-08 MED ORDER — SUFENTANIL CITRATE 50 MCG/ML IV SOLN
INTRAVENOUS | Status: DC | PRN
  Administered 2015-02-08 (×2): 10 ug via INTRAVENOUS
  Administered 2015-02-08: 5 ug via INTRAVENOUS
  Administered 2015-02-08 (×2): 10 ug via INTRAVENOUS

## 2015-02-08 MED ORDER — PROPOFOL 10 MG/ML IV BOLUS
INTRAVENOUS | Status: DC | PRN
  Administered 2015-02-08: 200 mg via INTRAVENOUS

## 2015-02-08 MED ORDER — ENOXAPARIN SODIUM 40 MG/0.4ML ~~LOC~~ SOLN
40.0000 mg | SUBCUTANEOUS | Status: AC
  Administered 2015-02-08: 40 mg via SUBCUTANEOUS
  Filled 2015-02-08: qty 0.4

## 2015-02-08 MED ORDER — PROPOFOL 10 MG/ML IV BOLUS
INTRAVENOUS | Status: AC
  Filled 2015-02-08: qty 20

## 2015-02-08 MED ORDER — LACTATED RINGERS IV SOLN
INTRAVENOUS | Status: DC | PRN
Start: 2015-02-08 — End: 2015-02-08
  Administered 2015-02-08: 1000 mL

## 2015-02-08 MED ORDER — LISINOPRIL 40 MG PO TABS
40.0000 mg | ORAL_TABLET | Freq: Every day | ORAL | Status: DC
  Administered 2015-02-08 – 2015-02-09 (×2): 40 mg via ORAL
  Filled 2015-02-08 (×2): qty 1

## 2015-02-08 MED ORDER — NEOSTIGMINE METHYLSULFATE 10 MG/10ML IV SOLN
INTRAVENOUS | Status: AC
  Filled 2015-02-08: qty 1

## 2015-02-08 MED ORDER — HYDROMORPHONE HCL 1 MG/ML IJ SOLN
0.2500 mg | INTRAMUSCULAR | Status: DC | PRN
  Administered 2015-02-08 (×4): 0.5 mg via INTRAVENOUS

## 2015-02-08 MED ORDER — ACETAMINOPHEN 10 MG/ML IV SOLN
1000.0000 mg | Freq: Once | INTRAVENOUS | Status: AC
  Administered 2015-02-08: 1000 mg via INTRAVENOUS
  Filled 2015-02-08: qty 100

## 2015-02-08 MED ORDER — HYDROMORPHONE HCL 2 MG/ML IJ SOLN
INTRAMUSCULAR | Status: AC
  Filled 2015-02-08: qty 1

## 2015-02-08 MED ORDER — ACETAMINOPHEN 325 MG PO TABS: 325.0000 mg | ORAL_TABLET | ORAL | Status: DC | PRN

## 2015-02-08 MED ORDER — HYDROMORPHONE HCL 1 MG/ML IJ SOLN
INTRAMUSCULAR | Status: DC | PRN
  Administered 2015-02-08 (×2): 1 mg via INTRAVENOUS

## 2015-02-08 MED ORDER — NEOSTIGMINE METHYLSULFATE 10 MG/10ML IV SOLN
INTRAVENOUS | Status: DC | PRN
  Administered 2015-02-08: 4 mg via INTRAVENOUS

## 2015-02-08 MED ORDER — IBUPROFEN 800 MG PO TABS: 800.0000 mg | ORAL_TABLET | Freq: Three times a day (TID) | ORAL | Status: DC | PRN

## 2015-02-08 MED ORDER — OXYCODONE-ACETAMINOPHEN 5-325 MG PO TABS
1.0000 | ORAL_TABLET | ORAL | Status: DC | PRN
  Administered 2015-02-08 – 2015-02-09 (×2): 2 via ORAL
  Filled 2015-02-08 (×2): qty 2

## 2015-02-08 MED ORDER — HYDRALAZINE HCL 50 MG PO TABS
50.0000 mg | ORAL_TABLET | Freq: Three times a day (TID) | ORAL | Status: DC
  Administered 2015-02-08 (×2): 50 mg via ORAL
  Filled 2015-02-08 (×6): qty 1

## 2015-02-08 MED ORDER — GLYCOPYRROLATE 0.2 MG/ML IJ SOLN
INTRAMUSCULAR | Status: DC | PRN
  Administered 2015-02-08: 0.6 mg via INTRAVENOUS

## 2015-02-08 MED ORDER — OXYCODONE HCL 5 MG PO TABS: 5.0000 mg | ORAL_TABLET | Freq: Once | ORAL | Status: DC | PRN

## 2015-02-08 MED ORDER — SODIUM CHLORIDE 0.9 % IJ SOLN
INTRAMUSCULAR | Status: AC
  Filled 2015-02-08: qty 20

## 2015-02-08 MED ORDER — OXYCODONE HCL 5 MG/5ML PO SOLN: 5.0000 mg | Freq: Once | ORAL | Status: DC | PRN

## 2015-02-08 MED ORDER — HYDROMORPHONE HCL 1 MG/ML IJ SOLN
0.2000 mg | INTRAMUSCULAR | Status: DC | PRN
  Administered 2015-02-08: 0.6 mg via INTRAVENOUS
  Administered 2015-02-08: 0.5 mg via INTRAVENOUS
  Administered 2015-02-09 (×3): 0.6 mg via INTRAVENOUS
  Filled 2015-02-08 (×5): qty 1

## 2015-02-08 MED ORDER — LACTATED RINGERS IV SOLN
INTRAVENOUS | Status: DC | PRN
  Administered 2015-02-08: 10:00:00 via INTRAVENOUS

## 2015-02-08 MED ORDER — ENOXAPARIN SODIUM 40 MG/0.4ML ~~LOC~~ SOLN
40.0000 mg | SUBCUTANEOUS | Status: DC
  Administered 2015-02-09: 40 mg via SUBCUTANEOUS
  Filled 2015-02-08: qty 0.4

## 2015-02-08 MED ORDER — GLYCOPYRROLATE 0.2 MG/ML IJ SOLN
INTRAMUSCULAR | Status: AC
  Filled 2015-02-08: qty 3

## 2015-02-08 MED ORDER — CEFAZOLIN SODIUM-DEXTROSE 2-3 GM-% IV SOLR
INTRAVENOUS | Status: AC
  Filled 2015-02-08: qty 50

## 2015-02-08 MED ORDER — IPRATROPIUM BROMIDE 0.03 % NA SOLN
2.0000 | Freq: Two times a day (BID) | NASAL | Status: DC
Start: 2015-02-08 — End: 2015-02-09
  Administered 2015-02-08 – 2015-02-09 (×3): 2 via NASAL
  Filled 2015-02-08: qty 30

## 2015-02-08 MED ORDER — DULOXETINE HCL 30 MG PO CPEP
30.0000 mg | ORAL_CAPSULE | Freq: Every day | ORAL | Status: DC
  Administered 2015-02-08 – 2015-02-09 (×2): 30 mg via ORAL
  Filled 2015-02-08 (×2): qty 1

## 2015-02-08 MED ORDER — LACTATED RINGERS IV SOLN: INTRAVENOUS | Status: DC

## 2015-02-08 MED ORDER — STERILE WATER FOR IRRIGATION IR SOLN
Status: DC | PRN
  Administered 2015-02-08: 3000 mL

## 2015-02-08 MED ORDER — ROCURONIUM BROMIDE 100 MG/10ML IV SOLN
INTRAVENOUS | Status: DC | PRN
  Administered 2015-02-08: 10 mg via INTRAVENOUS
  Administered 2015-02-08: 40 mg via INTRAVENOUS

## 2015-02-08 MED ORDER — METOCLOPRAMIDE HCL 5 MG/ML IJ SOLN
INTRAMUSCULAR | Status: DC | PRN
  Administered 2015-02-08: 5 mg via INTRAVENOUS

## 2015-02-08 MED ORDER — ONDANSETRON HCL 4 MG/2ML IJ SOLN
INTRAMUSCULAR | Status: DC | PRN
  Administered 2015-02-08: 4 mg via INTRAVENOUS

## 2015-02-08 SURGICAL SUPPLY — 46 items
CABLE HIGH FREQUENCY MONO STRZ (ELECTRODE) ×2 IMPLANT
CHLORAPREP W/TINT 26ML (MISCELLANEOUS) ×2 IMPLANT
CORDS BIPOLAR (ELECTRODE) ×2 IMPLANT
COVER SURGICAL LIGHT HANDLE (MISCELLANEOUS) ×2 IMPLANT
COVER TIP SHEARS 8 DVNC (MISCELLANEOUS) ×1 IMPLANT
COVER TIP SHEARS 8MM DA VINCI (MISCELLANEOUS) ×1
DERMABOND ADVANCED (GAUZE/BANDAGES/DRESSINGS) ×1
DERMABOND ADVANCED .7 DNX12 (GAUZE/BANDAGES/DRESSINGS) ×1 IMPLANT
DRAPE SHEET LG 3/4 BI-LAMINATE (DRAPES) ×4 IMPLANT
DRAPE SURG IRRIG POUCH 19X23 (DRAPES) ×2 IMPLANT
DRAPE TABLE BACK 44X90 PK DISP (DRAPES) ×4 IMPLANT
DRAPE WARM FLUID 44X44 (DRAPE) ×2 IMPLANT
DRSG TEGADERM 6X8 (GAUZE/BANDAGES/DRESSINGS) ×4 IMPLANT
ELECT REM PT RETURN 9FT ADLT (ELECTROSURGICAL) ×2
ELECTRODE REM PT RTRN 9FT ADLT (ELECTROSURGICAL) ×1 IMPLANT
GLOVE BIO SURGEON STRL SZ 6 (GLOVE) ×6 IMPLANT
GLOVE BIO SURGEON STRL SZ 6.5 (GLOVE) ×4 IMPLANT
GOWN STRL REUS W/ TWL LRG LVL3 (GOWN DISPOSABLE) ×3 IMPLANT
GOWN STRL REUS W/TWL LRG LVL3 (GOWN DISPOSABLE) ×3
HOLDER FOLEY CATH W/STRAP (MISCELLANEOUS) ×2 IMPLANT
KIT ACCESSORY DA VINCI DISP (KITS) ×1
KIT ACCESSORY DVNC DISP (KITS) ×1 IMPLANT
KIT BASIN OR (CUSTOM PROCEDURE TRAY) ×2 IMPLANT
LIQUID BAND (GAUZE/BANDAGES/DRESSINGS) ×2 IMPLANT
MANIPULATOR UTERINE 4.5 ZUMI (MISCELLANEOUS) ×2 IMPLANT
OCCLUDER COLPOPNEUMO (BALLOONS) ×2 IMPLANT
PEN SKIN MARKING BROAD (MISCELLANEOUS) ×2 IMPLANT
POUCH SPECIMEN RETRIEVAL 10MM (ENDOMECHANICALS) ×4 IMPLANT
SET TUBE IRRIG SUCTION NO TIP (IRRIGATION / IRRIGATOR) ×2 IMPLANT
SHEET LAVH (DRAPES) ×2 IMPLANT
SOLUTION ELECTROLUBE (MISCELLANEOUS) ×2 IMPLANT
SUT VIC AB 0 CT1 27 (SUTURE) ×3
SUT VIC AB 0 CT1 27XBRD ANTBC (SUTURE) ×3 IMPLANT
SUT VIC AB 4-0 PS2 27 (SUTURE) ×4 IMPLANT
SUT VICRYL 0 UR6 27IN ABS (SUTURE) ×2 IMPLANT
SYR 50ML LL SCALE MARK (SYRINGE) ×2 IMPLANT
TOWEL OR 17X26 10 PK STRL BLUE (TOWEL DISPOSABLE) ×4 IMPLANT
TOWEL OR NON WOVEN STRL DISP B (DISPOSABLE) ×2 IMPLANT
TRAP SPECIMEN MUCOUS 40CC (MISCELLANEOUS) IMPLANT
TRAY FOLEY CATH 14FRSI W/METER (CATHETERS) ×2 IMPLANT
TRAY LAPAROSCOPIC (CUSTOM PROCEDURE TRAY) ×2 IMPLANT
TROCAR 12M 150ML BLUNT (TROCAR) ×2 IMPLANT
TROCAR BLADELESS OPT 5 100 (ENDOMECHANICALS) ×2 IMPLANT
TROCAR XCEL 12X100 BLDLESS (ENDOMECHANICALS) ×2 IMPLANT
TUBING INSUFFLATION 10FT LAP (TUBING) ×2 IMPLANT
WATER STERILE IRR 1500ML POUR (IV SOLUTION) ×4 IMPLANT

## 2015-02-08 NOTE — Transfer of Care (Signed)
Immediate Anesthesia Transfer of Care Note  Patient: Elyssa Pendelton  Procedure(s) Performed: Procedure(s) with comments: ROBOTIC ASSISTED TOTAL HYSTERECTOMY WITH BILATERAL SALPINGO OOPHORECTOMY; UTERUS WEIGHING GREATER THAN 250 GRAMS (Bilateral) - BRCA 1 GENE MUTATION  Patient Location: PACU  Anesthesia Type:General  Level of Consciousness: awake, oriented and patient cooperative  Airway & Oxygen Therapy: Patient Spontanous Breathing and Patient connected to face mask oxygen  Post-op Assessment: Report given to RN and Post -op Vital signs reviewed and stable  Post vital signs: Reviewed and stable  Last Vitals:  Filed Vitals:   02/08/15 0809  BP: 143/77  Pulse: 60  Temp: 36.7 C  Resp: 20    Complications: No apparent anesthesia complications

## 2015-02-08 NOTE — Progress Notes (Signed)
Stable post op. No vaginal drainage noted. Dangled at bedside.C,DB.

## 2015-02-08 NOTE — Anesthesia Postprocedure Evaluation (Signed)
  Anesthesia Post-op Note  Patient: Tamara [name]  Procedure(s) Performed: Procedure(s) with comments: ROBOTIC ASSISTED TOTAL HYSTERECTOMY WITH BILATERAL SALPINGO OOPHORECTOMY; UTERUS WEIGHING GREATER THAN 250 GRAMS (Bilateral) - BRCA 1 GENE MUTATION  Patient Location: PACU  Anesthesia Type:General  Level of Consciousness: awake  Airway and Oxygen Therapy: Patient Spontanous Breathing and Patient connected to nasal cannula oxygen  Post-op Pain: moderate  Post-op Assessment: Post-op Vital signs reviewed, Patient's Cardiovascular Status Stable, Respiratory Function Stable, Patent Airway, No signs of Nausea or vomiting and Pain level controlled  Post-op Vital Signs: Reviewed and stable  Last Vitals:  Filed Vitals:   02/08/15 1800  BP: 127/77  Pulse: 66  Temp: 36.9 C  Resp:     Complications: No apparent anesthesia complications

## 2015-02-08 NOTE — Op Note (Signed)
Surgeon: Donaciano Eva   Assistants: Lahoma Crocker, MD (an MD assistant was necessary for tissue manipulation, management of robotic instrumentation, retraction and positioning due to the complexity of the case and hospital policies).   Anesthesia: General endotracheal anesthesia  ASA Class: 2   Pre-operative Diagnosis: fibroid uterus, BRCA 1 deleterious mutation  Post-operative Diagnosis: same  Operation: Robotic-assisted laparoscopic hysterectomy >250gm with bilateral salpingoophorectomy (an MD assistant was necessary for tissue manipulation, management of robotic instrumentation, retraction and positioning due to the complexity of the case and hospital policies).   Surgeon: Donaciano Eva  Assistant Surgeon: Lahoma Crocker MD  Anesthesia: GET  Urine Output: 200cc  Operative Findings:  : 12 week size fibroid uterus, simple cyst left ovary, normal appearing right tube and ovary.   Estimated Blood Loss:  less than 50 mL      Total IV Fluids: 1,000 ml         Specimens: washings, uterus, bilateral tubes and ovareis and cervix         Complications:  None; patient tolerated the procedure well.         Disposition: PACU - hemodynamically stable.  Procedure Details  The patient was seen in the Holding Room. The risks, benefits, complications, treatment options, and expected outcomes were discussed with the patient.  The patient concurred with the proposed plan, giving informed consent.  The site of surgery properly noted/marked. The patient was identified as Tamara [name] and the procedure verified as a Robotic-assisted hysterectomy with bilateral salpingo oophorectomy. A Time Out was held and the above information confirmed.  After induction of anesthesia, the patient was draped and prepped in the usual sterile manner. Pt was placed in supine position after anesthesia and draped and prepped in the usual sterile manner. The abdominal drape was placed after the  CholoraPrep had been allowed to dry for 3 minutes.  Her arms were tucked to her side with all appropriate precautions.  The chest was secured to the table.  The patient was placed in the semi-lithotomy position in [place].  The perineum was prepped with Betadine.  Foley catheter was placed.  A sterile speculum was placed in the vagina.  The cervix was grasped with a single-tooth tenaculum and dilated with Kennon Rounds dilators.  The ZUMI uterine manipulator with a medium colpotomizer ring was placed without difficulty.  A pneum occluder balloon was placed over the manipulator.  A second time-out was performed.  OG tube placement was confirmed and to suction.    Procedure:  The patient was brought to the operating room where general anesthesia was administered with no complications.  The patient was placed in the dorsal lithotomy position in padded Allen stirrups.  The arms were tucked at the sides with gel pads protecting the elbows and foam protecting the hands. The patient was then prepped.  A Foley was placed to gravity.  A medium size KOH ring was used to place around the cervix after the cervix had been dilated and then a RUMI manipulator was attached in the normal manner.  The patient was then draped in the normal manner.  Next, a 5 mm skin incision was made 1 cm below the subcostal margin in the midclavicular line.  The 5 mm Optiview port and scope was used for direct entry.  Opening pressure was under 10 mm CO2.  The abdomen was insufflated and the findings were noted as above.   At this point and all points during the procedure, the patient's intra-abdominal  pressure did not exceed 15 mmHg. Next, a 10 mm skin incision was made in the umbilicus and a right and left port was placed about 10 cm lateral to the robot port on the right and left side.   All ports were placed under direct visualization.  The patient was placed in steep Trendelenburg.  Bowel was away into the upper abdomen.  The robot was docked  in the normal manner.  The hysterectomy was started after the round ligament on the right side was incised and the retroperitoneum was entered and the pararectal space was developed.  The ureter was noted to be on the medial leaf of the broad ligament.  The peritoneum above the ureter was incised and stretched and the infundibulopelvic ligament was skeletonized, cauterized and cut.  The posterior peritoneum was taken down to the level of the KOH ring.  The anterior peritoneum was also taken down.  The bladder flap was created to the level of the KOH ring.  The uterine artery on the right side was skeletonized, cauterized and cut in the normal manner.  A similar procedure was performed on the left.  The colpotomy was made and the uterus, cervix, bilateral ovaries and tubes were amputated and delivered through the vagina.  Pedicles were inspected and excellent hemostasis was achieved.    The colpotomy at the vaginal cuff was closed with Vicryl on a CT1 needle in a running manner.  Irrigation was used and excellent hemostasis was achieved.  At this point in the procedure was completed.  Robotic instruments were removed under direct visulaization.  The robot was undocked. The 10 mm ports were closed with Vicryl on a UR-5 needle and the fascia was closed with 0 Vicryl on a UR-5 needle.  The skin was closed with 4-0 Vicryl in a subcuticular manner.  Steri-Strips were applied and bandages were also applied.  Sponge, lap and needle counts correct x 2.  The patient was taken to the recovery room in stable condition.  The vagina was swabbed with  minimal bleeding noted.   All instrument and needle counts were correct x  3.   The patient was transferred to the recovery room in stable condition.   Donaciano Eva, MD

## 2015-02-08 NOTE — H&P (View-Only) (Signed)
Consult Note: Gyn-Onc  Consult was requested by  Tamara Borders, NP for the evaluation of Tamara [name] 44 y.o. female with a deleterious mutation of BRCA1  CC:  Chief Complaint  Patient presents with  . BRAC gene    To discuss hysterectomy    Assessment/Plan:  [name]  is a 44 y.o.  year old with a deleterious mutation of BRCA1. She desires definitive risk reducing surgery with hysterectomy and BSO. She also has a history of uterine fibroids, and a recent history of a deep vein thrombosis associated with a Port-A-Cath.  I discussed with Mardene Celeste that she is an acceptable risk for undergoing a surgery. Her surgeries likely be more complex given her bulky uterine size, history of fibroids, history of cesarean section, and history of DVT approximately 8 months ago. I discussed that this puts her at increased risk for recurrent VTE event. I am recommending prophylactic Lovenox for one month postoperatively to reduce this risk. I discussed with the patient that being on Lovenox prophylaxis increases her risk for bleeding, location. We also discussed the potential need for a minilaparotomy to deliver the uterine specimen given her bulky fibroid uterus. We also discussed surgical risks including  bleeding, infection, damage to internal organs (such as bladder,ureters, bowels), blood clot, reoperation and rehospitalization.   We discussed the indications of the BRCA1 deleterious mutation including increased risk for fallopian tube and ovarian cancer and her lifetime risk of approximately 35-40%. I discussed that this risk and almost be completely eliminated but not completely eliminated with bilateral salpingo-oophorectomy. The patient desires to pursue surgery with a robotic hysterectomy, BSO. She understands that this is associated with surgical menopause postoperatively. Of note her personal history of breast cancer was for an ER/PR negative tumor. Given her premenopausal age and her hormone  receptor negative breast cancer history I am recommending postoperative estrogen replacement therapy until age of natural menopause which is approximately age 71. I discussed that this decreases her risk of all cause mortality.   HPI: Tamara [name] is a 44 year old para 5 who is seen in consultation at the request of Tamara Borders NP for a deleterious mutation in BRCA1. The patient desires definitive risk reducing surgery with hysterectomy BSO. 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 She was treated with chemotherapy and surgery. She is currently taking Herceptin.  Her breast cancer therapy was complicated with development of a DVT at the Port-A-Cath site in June 2015. Further she was treated with anticoagulation has been off that for a proximally one month.   Her family history is most striking the positive for breast cancer and she is known for serious secondary relatives with a history of ovarian cancer. She carries the BRCA 1 deleterious gene mutation through her father's side.  The patient has had one prior cesarean section and 4 SVTs. She has a known fibroid uterus. CT scan performed in 2011 revealed multiple small intramural fibroids and a bulky uterus. I personally reviewed these films myself. She continues to get heavy menses. She strongly desires hysterectomy as part of her wrist reductive surgery given her symptomatic uterine fibroids.   Current Meds:  Outpatient Encounter Prescriptions as of 01/24/2015  Medication Sig  . acetaminophen (TYLENOL) 325 MG tablet Take 2 tablets (650 mg total) by mouth every 6 (six) hours as needed for mild pain (or Temp > 100).  . carvedilol (COREG) 12.5 MG tablet Take 1 tablet (12.5 mg  total) by mouth 2 (two) times daily with a meal.  . cyclobenzaprine (FLEXERIL) 10 MG tablet Take 1 tablet (10 mg total) by mouth 3 (three) times daily as needed for muscle spasms.  . diazepam  (VALIUM) 2 MG tablet Take 1 tablet (2 mg total) by mouth every 6 (six) hours as needed for anxiety or muscle spasms.  . DULoxetine (CYMBALTA) 30 MG capsule Take 1 capsule (30 mg total) by mouth daily.  . ferrous sulfate 325 (65 FE) MG tablet Take 325 mg by mouth 3 (three) times daily with meals.  . hydrALAZINE (APRESOLINE) 50 MG tablet Take 1 tablet (50 mg total) by mouth every 8 (eight) hours.  . hydrochlorothiazide (MICROZIDE) 12.5 MG capsule Take 1 capsule (12.5 mg total) by mouth daily.  Marland Kitchen ibuprofen (ADVIL,MOTRIN) 800 MG tablet Take 800 mg by mouth every 8 (eight) hours as needed (pain).  Marland Kitchen ipratropium (ATROVENT) 0.03 % nasal spray Place 2 sprays into the nose 2 (two) times daily. PRN congestion  . lidocaine-prilocaine (EMLA) cream Apply 1 application topically as needed (For port-a-cath.).  Marland Kitchen lisinopril (PRINIVIL,ZESTRIL) 40 MG tablet Take 1 tablet (40 mg total) by mouth daily.  Marland Kitchen LORazepam (ATIVAN) 0.5 MG tablet Take 1 tablet (0.5 mg total) by mouth every 6 (six) hours as needed (Nausea or vomiting).  . ondansetron (ZOFRAN) 8 MG tablet Take 8 mg by mouth 2 (two) times daily as needed for nausea or vomiting.   Marland Kitchen oxyCODONE (OXY IR/ROXICODONE) 5 MG immediate release tablet Take 1 tablet (5 mg total) by mouth every 4 (four) hours as needed for severe pain.  Marland Kitchen PRESCRIPTION MEDICATION Chemo at [facility]  . [DISCONTINUED] enoxaparin (LOVENOX) 100 MG/ML injection Inject 0.95 mLs (95 mg total) into the skin every 12 (twelve) hours. (Patient not taking: Reported on 01/11/2015)  . [DISCONTINUED] HYDROcodone-homatropine (HYCODAN) 5-1.5 MG/5ML syrup Take 5 mLs by mouth every 6 (six) hours as needed for cough. (Patient not taking: Reported on 12/04/2014)    Allergy:  Allergies  Allergen Reactions  . Compazine [Prochlorperazine Edisylate] Other (See Comments)    Stuttering    Social Hx:   History   Social History  . Marital Status: Legally Separated    Spouse Name: N/A  . Number of Children: 5  . Years  of Education: N/A   Occupational History  . Not on file.   Social History Main Topics  . Smoking status: Former Smoker -- 0.25 packs/day for 15 years    Quit date: 02/18/2009  . Smokeless tobacco: Former Systems developer  . Alcohol Use: No     Comment: occasional  . Drug Use: No  . Sexual Activity: Not Currently    Birth Control/ Protection: Surgical   Other Topics Concern  . Not on file   Social History Narrative    Past Surgical Hx:  Past Surgical History  Procedure Laterality Date  . Cervical polypectomy  2010  . Cesarean section      one previous  . Tubal ligation    . Portacath placement Right 02/23/2014    Procedure: INSERTION PORT-A-CATH;  Surgeon: Joyice Faster. Cornett, MD;  Location: [place];  Service: General;  Laterality: Right;  . Port-a-cath removal N/A 05/21/2014    Procedure: REMOVAL PORT-A-CATH;  Surgeon: Zenovia Jarred, MD;  Location: [place];  Service: General;  Laterality: N/A;  . Tee without cardioversion N/A 05/26/2014    Procedure: TRANSESOPHAGEAL ECHOCARDIOGRAM (TEE);  Surgeon: Larey Dresser, MD;  Location: [place];  Service: Cardiovascular;  Laterality:  N/A;  . Bilateral total mastectomy with axillary lymph node dissection Bilateral 07/28/2014    Procedure: BILATERAL TOTAL MASTECTOMY WITH LEFT AXILLARY SENTINEL LYMPH NODE BIOPSY;  Surgeon: Stark Klein, MD;  Location: [place];  Service: General;  Laterality: Bilateral;  . Breast reconstruction with placement of tissue expander and flex hd (acellular hydrated dermis) Bilateral 07/28/2014    Procedure: BILATERAL BREAST RECONSTRUCTION WITH PLACEMENT OF TISSUE EXPANDER AND FLEX HD (ACELLULAR HYDRATED DERMIS);  Surgeon: Theodoro Kos, DO;  Location: [place];  Service: Plastics;  Laterality: Bilateral;  . Removal of tissue expander and placement of implant Bilateral 10/14/2014    Procedure: REMOVAL OF BILATERAL BREAST  TISSUE EXPANDER  WITH PLASCEMENT OF BILATERAL  BREAST IMPLANTS;  Surgeon: Theodoro Kos, DO;   Location: [facility];  Service: Plastics;  Laterality: Bilateral;  . Left heart catheterization with coronary angiogram N/A 09/13/2014    Procedure: LEFT HEART CATHETERIZATION WITH CORONARY ANGIOGRAM;  Surgeon: Jolaine Artist, MD;  Location: [facility] CATH LAB;  Service: Cardiovascular;  Laterality: N/A;    Past Medical Hx:  Past Medical History  Diagnosis Date  . Endometriosis   . Hypertension   . Rash     fine rash on abd  . Anemia   . Wears glasses   . Iron deficiency anemia, unspecified 02/25/2014  . BRCA1 positive     c.190T>G (p.Cys64Gly) @ [facility]   . Shortness of breath      when Hemoglobin low  . History of blood transfusion   . DVT (deep venous thrombosis)     Right Subclavian and IJ.  . Breast cancer 02/01/14    ER-/PR-/Her2+, still receiving chemo 12/21/14    Past Gynecological History:  SVD x 4, CSx1  No LMP recorded. Patient is not currently having periods (Reason: Chemotherapy).  Family Hx:  Family History  Problem Relation Age of Onset  . Breast cancer Mother 77    currently 50  . Diabetes Father   . Pancreatic cancer Father 26  . Breast cancer Paternal Aunt 43    currently 31; BRCA1 positive  . Stroke Maternal Grandfather   . Cancer Paternal Aunt     unk. primary; deceased 62s  . Breast cancer Cousin     daughter of unaffected paternal aunt; dx in her 23s    Review of Systems:  Constitutional  Feels well,    ENT Normal appearing ears and nares bilaterally Skin/Breast  No rash, sores, jaundice, itching, dryness Cardiovascular  No chest pain, shortness of breath, or edema  Pulmonary  No cough or wheeze.  Gastro Intestinal  No nausea, vomitting, or diarrhoea. No bright red blood per rectum, no abdominal pain, change in bowel movement, or constipation.  Genito Urinary  No frequency, urgency, dysuria,  Musculo Skeletal  No myalgia, arthralgia, joint swelling or pain  Neurologic  No weakness, numbness, change in gait,  Psychology  No  depression, anxiety, insomnia.   Vitals:  Blood pressure 125/81, pulse 70, temperature 98.1 F (36.7 C), resp. rate 20, height 5' 5"  (1.651 m), weight 213 lb 12.8 oz (96.979 kg).  Physical Exam: WD in NAD Neck  Supple NROM, without any enlargements.  Lymph Node Survey No cervical supraclavicular or inguinal adenopathy Cardiovascular  Pulse normal rate, regularity and rhythm. S1 and S2 normal.  Lungs  Clear to auscultation bilateraly, without wheezes/crackles/rhonchi. Good air movement.  Skin  No rash/lesions/breakdown  Psychiatry  Alert and oriented to person, place, and time  Abdomen  Normoactive bowel sounds, abdomen soft,  non-tender and overweight without evidence of hernia.  Back No CVA tenderness Genito Urinary  Vulva/vagina: Normal external female genitalia.  No lesions. No discharge or bleeding.  Bladder/urethra:  No lesions or masses, well supported bladder  Vagina: normal  Cervix: Normal appearing, no lesions.  Uterus: 14 week size, mobile, no parametrial involvement or nodularity.  Adnexa: no discrete palpable masses. Rectal  Good tone, no masses no cul de sac nodularity.  Extremities  No bilateral cyanosis, clubbing or edema.   Donaciano Eva, MD   01/24/2015, 5:31 PM

## 2015-02-08 NOTE — Anesthesia Preprocedure Evaluation (Signed)
Anesthesia Evaluation  Patient identified by MRN, date of birth, ID band Patient awake    Reviewed: Allergy & Precautions, NPO status , Patient's Chart, lab work & pertinent test results  History of Anesthesia Complications Negative for: history of anesthetic complications  Airway Mallampati: I  TM Distance: >3 FB Neck ROM: Full    Dental  (+) Teeth Intact   Pulmonary neg shortness of breath, neg sleep apnea, neg COPDneg recent URI, former smoker,  breath sounds clear to auscultation        Cardiovascular hypertension, Pt. on medications and Pt. on home beta blockers - angina- Past MI and - CHF Rhythm:Regular     Neuro/Psych  Headaches, PSYCHIATRIC DISORDERS Depression    GI/Hepatic negative GI ROS, Neg liver ROS,   Endo/Other  Morbid obesity  Renal/GU negative Renal ROS     Musculoskeletal   Abdominal   Peds  Hematology  (+) anemia ,   Anesthesia Other Findings   Reproductive/Obstetrics                             Anesthesia Physical Anesthesia Plan  ASA: III  Anesthesia Plan: General   Post-op Pain Management:    Induction: Intravenous  Airway Management Planned: Oral ETT  Additional Equipment: None  Intra-op Plan:   Post-operative Plan: Extubation in OR  Informed Consent: I have reviewed the patients History and Physical, chart, labs and discussed the procedure including the risks, benefits and alternatives for the proposed anesthesia with the patient or authorized representative who has indicated his/her understanding and acceptance.   Dental advisory given  Plan Discussed with: CRNA and Surgeon  Anesthesia Plan Comments:         Anesthesia Quick Evaluation

## 2015-02-08 NOTE — Interval H&P Note (Signed)
History and Physical Interval Note:  02/08/2015 10:25 AM  Tamara [name]  has presented today for surgery, with the diagnosis of BRAC Postive  Breast Cancer  The various methods of treatment have been discussed with the patient and family. After consideration of risks, benefits and other options for treatment, the patient has consented to  Procedure(s): ROBOTIC ASSISTED TOTAL HYSTERECTOMY WITH BILATERAL SALPINGO OOPHORECTOMY (Bilateral) as a surgical intervention .  [name] history has been reviewed, patient examined, no change in status, stable for surgery.  I have reviewed the patient's chart and labs.  Questions were answered to the patient's satisfaction.     Donaciano Eva

## 2015-02-09 ENCOUNTER — Encounter (HOSPITAL_COMMUNITY): Payer: Self-pay | Admitting: Gynecologic Oncology

## 2015-02-09 DIAGNOSIS — D259 Leiomyoma of uterus, unspecified: Secondary | ICD-10-CM | POA: Diagnosis not present

## 2015-02-09 LAB — CBC
HCT: 28.5 % — ABNORMAL LOW (ref 36.0–46.0)
Hemoglobin: 8.9 g/dL — ABNORMAL LOW (ref 12.0–15.0)
MCH: 28 pg (ref 26.0–34.0)
MCHC: 31.2 g/dL (ref 30.0–36.0)
MCV: 89.6 fL (ref 78.0–100.0)
PLATELETS: 407 10*3/uL — AB (ref 150–400)
RBC: 3.18 MIL/uL — ABNORMAL LOW (ref 3.87–5.11)
RDW: 14 % (ref 11.5–15.5)
WBC: 11 10*3/uL — ABNORMAL HIGH (ref 4.0–10.5)

## 2015-02-09 LAB — BASIC METABOLIC PANEL
Anion gap: 8 (ref 5–15)
BUN: 11 mg/dL (ref 6–23)
CO2: 25 mmol/L (ref 19–32)
Calcium: 8.9 mg/dL (ref 8.4–10.5)
Chloride: 105 mmol/L (ref 96–112)
Creatinine, Ser: 0.87 mg/dL (ref 0.50–1.10)
GFR calc Af Amer: >90 mL/min (ref >90)
GFR, EST NON AFRICAN AMERICAN: 82 mL/min — AB (ref >90)
GLUCOSE: 113 mg/dL — AB (ref 70–99)
Potassium: 3.6 mmol/L (ref 3.5–5.1)
Sodium: 138 mmol/L (ref 135–145)

## 2015-02-09 MED ORDER — OXYCODONE HCL 5 MG PO TABS: 5.0000 mg | ORAL_TABLET | ORAL | Status: DC | PRN

## 2015-02-09 MED ORDER — ENOXAPARIN SODIUM 40 MG/0.4ML ~~LOC~~ SOLN: 40.0000 mg | SUBCUTANEOUS | Status: DC

## 2015-02-09 NOTE — Discharge Summary (Signed)
Physician Discharge Summary  Patient ID: Tamara [name] MRN: [id number] DOB/AGE: Sep 03, 1971 44 y.o.  Admit date: 02/08/2015 Discharge date: 02/09/2015  Admission Diagnoses: BRCA1 positive  Discharge Diagnoses:  Active Problems:   BRCA1 positive   Heavy menstrual bleeding   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   Discharged Condition:  The patient is in good condition and stable for discharge.    Hospital Course: On 02/08/2015, the patient underwent the following: Procedure(s): ROBOTIC ASSISTED TOTAL HYSTERECTOMY WITH BILATERAL SALPINGO OOPHORECTOMY; UTERUS WEIGHING GREATER THAN 250 GRAMS.  The postoperative course was uneventful.  She was discharged to home on postoperative day 1 tolerating a regular diet.  Consults: None  Significant Diagnostic Studies: None  Treatments: surgery: see above  Discharge Exam: Blood pressure 100/57, pulse 70, temperature 98.3 F (36.8 C), temperature source Oral, resp. rate 14, height 5' 5"  (1.651 m), weight 211 lb 8 oz (95.936 kg), SpO2 100 %. General appearance: alert, cooperative and no distress Resp: clear to auscultation bilaterally Cardio: regular rate and rhythm, S1, S2 normal, no murmur, click, rub or gallop GI: abdomen soft, tender on palpation, slightly distended, mildly tympanic, active bowel sounds Extremities: extremities normal, atraumatic, no cyanosis or edema Incision/Wound: Lap sites to abdomen with derma-bond without erythema or drainage  Disposition: 01-Home or Self Care      Discharge Instructions    Call MD for:  difficulty breathing, headache or visual disturbances    Complete by:  As directed      Call MD for:  extreme fatigue    Complete by:  As directed      Call MD for:  hives    Complete by:  As directed      Call MD for:  persistant dizziness or light-headedness    Complete by:  As directed      Call MD for:  persistant nausea and vomiting    Complete by:  As directed      Call MD for:  redness, tenderness, or signs of infection  (pain, swelling, redness, odor or green/yellow discharge around incision site)    Complete by:  As directed      Call MD for:  severe uncontrolled pain    Complete by:  As directed      Call MD for:  temperature >100.4    Complete by:  As directed      Diet - low sodium heart healthy    Complete by:  As directed      Driving Restrictions    Complete by:  As directed   No driving for 1 week.  Do not take narcotics and drive.     Increase activity slowly    Complete by:  As directed      Lifting restrictions    Complete by:  As directed   No lifting greater than 10 lbs.     Sexual Activity Restrictions    Complete by:  As directed   No sexual activity, nothing in the vagina, for 8 weeks.            Medication List    STOP taking these medications        HYDROcodone-acetaminophen 5-325 MG per tablet  Commonly known as:  NORCO/VICODIN     naproxen 250 MG tablet  Commonly known as:  NAPROSYN      TAKE these medications        acetaminophen 325 MG tablet  Commonly known as:  TYLENOL  Take 2 tablets (650 mg total) by mouth every 6 (  six) hours as needed for mild pain (or Temp > 100).     carvedilol 12.5 MG tablet  Commonly known as:  COREG  Take 1 tablet (12.5 mg total) by mouth 2 (two) times daily with a meal.     cyclobenzaprine 10 MG tablet  Commonly known as:  FLEXERIL  Take 1 tablet (10 mg total) by mouth 3 (three) times daily as needed for muscle spasms.     diazepam 2 MG tablet  Commonly known as:  VALIUM  Take 1 tablet (2 mg total) by mouth every 6 (six) hours as needed for anxiety or muscle spasms.     DULoxetine 30 MG capsule  Commonly known as:  CYMBALTA  Take 1 capsule (30 mg total) by mouth daily.     enoxaparin 40 MG/0.4ML injection  Commonly known as:  LOVENOX  Inject 0.4 mLs (40 mg total) into the skin daily.     ferrous sulfate 325 (65 FE) MG tablet  Take 325 mg by mouth 3 (three) times daily with meals.     hydrALAZINE 50 MG tablet  Commonly  known as:  APRESOLINE  Take 1 tablet (50 mg total) by mouth every 8 (eight) hours.     hydrochlorothiazide 12.5 MG capsule  Commonly known as:  MICROZIDE  Take 1 capsule (12.5 mg total) by mouth daily.     ibuprofen 800 MG tablet  Commonly known as:  ADVIL,MOTRIN  Take 800 mg by mouth every 8 (eight) hours as needed (pain).     ipratropium 0.03 % nasal spray  Commonly known as:  ATROVENT  Place 2 sprays into the nose 2 (two) times daily. PRN congestion     lidocaine-prilocaine cream  Commonly known as:  EMLA  Apply 1 application topically as needed (For port-a-cath.).     lisinopril 40 MG tablet  Commonly known as:  PRINIVIL,ZESTRIL  Take 1 tablet (40 mg total) by mouth daily.     LORazepam 0.5 MG tablet  Commonly known as:  ATIVAN  Take 1 tablet (0.5 mg total) by mouth every 6 (six) hours as needed (Nausea or vomiting).     ondansetron 8 MG tablet  Commonly known as:  ZOFRAN  Take 8 mg by mouth 2 (two) times daily as needed for nausea or vomiting.     oxyCODONE 5 MG immediate release tablet  Commonly known as:  Oxy IR/ROXICODONE  Take 1-2 tablets (5-10 mg total) by mouth every 4 (four) hours as needed for severe pain.     PRESCRIPTION MEDICATION  Chemo at [facility]       Follow-up Information    Follow up with Donaciano Eva, MD On 03/07/2015.   Specialty:  Obstetrics and Gynecology   Why:  at 10:30am at the [facility] information:   [place]  [phone]       Greater than thirty minutes were spend for face to face discharge instructions and discharge orders/summary in EPIC.   Signed: CROSS, MELISSA DEAL 02/09/2015, 11:54 AM

## 2015-02-09 NOTE — Discharge Instructions (Signed)
02/09/2015  Return to work: 4-6 weeks if applicable  Activity: 1. Be up and out of the bed during the day.  Take a nap if needed.  You may walk up steps but be careful and use the hand rail.  Stair climbing will tire you more than you think, you may need to stop part way and rest.   2. No lifting or straining for 6 weeks.  3. No driving for 1 week(s).  Do not drive if you are taking narcotic pain medicine.  4. Shower daily.  Use soap and water on your incision and pat dry; don't rub.  No tub baths until cleared by your surgeon.   5. No sexual activity and nothing in the vagina for 8 weeks.  Diet: 1. Low sodium Heart Healthy Diet is recommended.  2. It is safe to use a laxative, such as Miralax or Colace, if you have difficulty moving your bowels.   Wound Care: 1. Keep clean and dry.  Shower daily.  Reasons to call the Doctor:  Fever - Oral temperature greater than 100.4 degrees Fahrenheit  Foul-smelling vaginal discharge  Difficulty urinating  Nausea and vomiting  Increased pain at the site of the incision that is unrelieved with pain medicine.  Difficulty breathing with or without chest pain  New calf pain especially if only on one side  Sudden, continuing increased vaginal bleeding with or without clots.   Contacts: For questions or concerns you should contact:  Dr. Everitt Amber at [phone]  Joylene John, NP at [phone]  After Hours: call [phone] and ask for the GYN Oncologist on call  Oxycodone tablets or capsules What is this medicine? OXYCODONE (ox i KOE done) is a pain reliever. It is used to treat moderate to severe pain. This medicine may be used for other purposes; ask your health care provider or pharmacist if you have questions. COMMON BRAND NAME(S): Dazidox, Endocodone, OXECTA, OxyIR, Percolone, Roxicodone What should I tell my health care provider before I take this medicine? They need to know if you have any of these  conditions: -Addison's disease -brain tumor -drug abuse or addiction -head injury -heart disease -if you frequently drink alcohol containing drinks -kidney disease or problems going to the bathroom -liver disease -lung disease, asthma, or breathing problems -mental problems -an unusual or allergic reaction to oxycodone, codeine, hydrocodone, morphine, other medicines, foods, dyes, or preservatives -pregnant or trying to get pregnant -breast-feeding How should I use this medicine? Take this medicine by mouth with a glass of water. Follow the directions on the prescription label. You can take it with or without food. If it upsets your stomach, take it with food. Take your medicine at regular intervals. Do not take it more often than directed. Do not stop taking except on your doctor's advice. Some brands of this medicine, like Oxecta, have special instructions. Ask your doctor or pharmacist if these directions are for you: Do not cut, crush or chew this medicine. Swallow only one tablet at a time. Do not wet, soak, or lick the tablet before you take it. Talk to your pediatrician regarding the use of this medicine in children. Special care may be needed. Overdosage: If you think you have taken too much of this medicine contact a poison control center or emergency room at once. NOTE: This medicine is only for you. Do not share this medicine with others. What if I miss a dose? If you miss a dose, take it as soon as you can. If  it is almost time for your next dose, take only that dose. Do not take double or extra doses. What may interact with this medicine? -alcohol -antihistamines -certain medicines used for nausea like chlorpromazine, droperidol -erythromycin -ketoconazole -medicines for depression, anxiety, or psychotic disturbances -medicines for sleep -muscle relaxants -naloxone -naltrexone -narcotic medicines (opiates) for  pain -nilotinib -phenobarbital -phenytoin -rifampin -ritonavir -voriconazole This list may not describe all possible interactions. Give your health care provider a list of all the medicines, herbs, non-prescription drugs, or dietary supplements you use. Also tell them if you smoke, drink alcohol, or use illegal drugs. Some items may interact with your medicine. What should I watch for while using this medicine? Tell your doctor or health care professional if your pain does not go away, if it gets worse, or if you have new or a different type of pain. You may develop tolerance to the medicine. Tolerance means that you will need a higher dose of the medicine for pain relief. Tolerance is normal and is expected if you take this medicine for a long time. Do not suddenly stop taking your medicine because you may develop a severe reaction. Your body becomes used to the medicine. This does NOT mean you are addicted. Addiction is a behavior related to getting and using a drug for a non-medical reason. If you have pain, you have a medical reason to take pain medicine. Your doctor will tell you how much medicine to take. If your doctor wants you to stop the medicine, the dose will be slowly lowered over time to avoid any side effects. You may get drowsy or dizzy when you first start taking this medicine or change doses. Do not drive, use machinery, or do anything that may be dangerous until you know how the medicine affects you. Stand or sit up slowly. There are different types of narcotic medicines (opiates) for pain. If you take more than one type at the same time, you may have more side effects. Give your health care provider a list of all medicines you use. Your doctor will tell you how much medicine to take. Do not take more medicine than directed. Call emergency for help if you have problems breathing. This medicine will cause constipation. Try to have a bowel movement at least every 2 to 3 days. If you do  not have a bowel movement for 3 days, call your doctor or health care professional. Your mouth may get dry. Drinking water, chewing sugarless gum, or sucking on hard candy may help. See your dentist every 6 months. What side effects may I notice from receiving this medicine? Side effects that you should report to your doctor or health care professional as soon as possible: -allergic reactions like skin rash, itching or hives, swelling of the face, lips, or tongue -breathing problems -confusion -feeling faint or lightheaded, falls -trouble passing urine or change in the amount of urine -unusually weak or tired Side effects that usually do not require medical attention (report to your doctor or health care professional if they continue or are bothersome): -constipation -dry mouth -itching -nausea, vomiting -upset stomach This list may not describe all possible side effects. Call your doctor for medical advice about side effects. You may report side effects to FDA at 1-800-FDA-1088. Where should I keep my medicine? Keep out of the reach of children. This medicine can be abused. Keep your medicine in a safe place to protect it from theft. Do not share this medicine with anyone. Selling or giving  away this medicine is dangerous and against the law. Store at room temperature between 15 and 30 degrees C (59 and 86 degrees F). Protect from light. Keep container tightly closed. This medicine may cause accidental overdose and death if it is taken by other adults, children, or pets. Flush any unused medicine down the toilet to reduce the chance of harm. Do not use the medicine after the expiration date. NOTE: This sheet is a summary. It may not cover all possible information. If you have questions about this medicine, talk to your doctor, pharmacist, or health care provider.  2015, [employer] Standard. (2013-07-30 13:43:33)

## 2015-02-09 NOTE — Progress Notes (Signed)
Discharge instructions given to pt with all questions answered.

## 2015-02-10 ENCOUNTER — Telehealth: Payer: Self-pay | Admitting: Nurse Practitioner

## 2015-02-10 ENCOUNTER — Telehealth: Payer: Self-pay | Admitting: *Deleted

## 2015-02-10 NOTE — Telephone Encounter (Signed)
PT.HAD HER HYSTERECTOMY DONE ON 02/08/15. SHE HAD HERCEPTIN ON 02/01/15. WHEN CAN PT. HAVE HER TEETH PULLED? SPOKE TO VAL DODD,RN. NOTIFIED PT. NO PROBLEMS WITH HERCEPTIN. PT. MAY HAVE HER TEETH PULLED AT ANY TIME.

## 2015-02-10 NOTE — Telephone Encounter (Signed)
This RN tried to reach patient but busy signal on multiple attempts. Want to verify lovenox has been delivered to home and check on patient status after 02/08/15 surgery. RN to follow up.

## 2015-02-14 ENCOUNTER — Telehealth: Payer: Self-pay | Admitting: Gynecologic Oncology

## 2015-02-14 NOTE — Telephone Encounter (Signed)
Patient called about increasing her pain medication.  Stating she is taking two 5 mg oxycodone tablets every six hours for pain and is getting "some relief."  Reporting moderate left sided abdominal pain today that "feels like someone is ripping me open."  Stating she had her first bowel movement today since surgery.  Advised to continue taking stool softeners while using pain medication unless diarrhea develops.  Reporting a mild pressure with urination but no hematuria or dysuria.  Denies vaginal bleeding.  No fever, chills, nausea, or vomiting.  Situation discussed with Dr. Denman George.  Patient advised to take two tablets for pain every six hours PRN and use a heating pad on low heat.  She is to call when she is getting low on her pain medication.  Currently, she has 32 pills left.  She will only be prescribed pain medication up to 4 weeks post-op.  Reportable signs and symptoms reviewed.  She is to call with an update on the pain.  No other concerns voiced.

## 2015-02-16 ENCOUNTER — Ambulatory Visit: Payer: Medicaid Other | Attending: Gynecologic Oncology | Admitting: Gynecologic Oncology

## 2015-02-16 ENCOUNTER — Encounter: Payer: Self-pay | Admitting: Gynecologic Oncology

## 2015-02-16 ENCOUNTER — Other Ambulatory Visit: Payer: Self-pay | Admitting: *Deleted

## 2015-02-16 ENCOUNTER — Other Ambulatory Visit (HOSPITAL_BASED_OUTPATIENT_CLINIC_OR_DEPARTMENT_OTHER): Payer: Medicaid Other

## 2015-02-16 ENCOUNTER — Telehealth: Payer: Self-pay | Admitting: *Deleted

## 2015-02-16 ENCOUNTER — Encounter: Payer: Self-pay | Admitting: *Deleted

## 2015-02-16 VITALS — BP 124/81 | HR 75 | Temp 98.1°F | Resp 16 | Ht 65.0 in | Wt 206.1 lb

## 2015-02-16 DIAGNOSIS — Z90722 Acquired absence of ovaries, bilateral: Secondary | ICD-10-CM | POA: Diagnosis not present

## 2015-02-16 DIAGNOSIS — Z90710 Acquired absence of both cervix and uterus: Secondary | ICD-10-CM

## 2015-02-16 DIAGNOSIS — R1030 Lower abdominal pain, unspecified: Secondary | ICD-10-CM

## 2015-02-16 DIAGNOSIS — N9489 Other specified conditions associated with female genital organs and menstrual cycle: Secondary | ICD-10-CM | POA: Insufficient documentation

## 2015-02-16 DIAGNOSIS — R300 Dysuria: Secondary | ICD-10-CM

## 2015-02-16 DIAGNOSIS — R3919 Other difficulties with micturition: Secondary | ICD-10-CM

## 2015-02-16 DIAGNOSIS — K5900 Constipation, unspecified: Secondary | ICD-10-CM | POA: Diagnosis not present

## 2015-02-16 DIAGNOSIS — R102 Pelvic and perineal pain: Secondary | ICD-10-CM

## 2015-02-16 DIAGNOSIS — Z09 Encounter for follow-up examination after completed treatment for conditions other than malignant neoplasm: Secondary | ICD-10-CM | POA: Insufficient documentation

## 2015-02-16 LAB — URINALYSIS, MICROSCOPIC - CHCC
Bilirubin (Urine): NEGATIVE
Blood: NEGATIVE
Glucose: NEGATIVE mg/dL
Ketones: 5 mg/dL
NITRITE: NEGATIVE
SPECIFIC GRAVITY, URINE: 1.025 (ref 1.003–1.035)
UROBILINOGEN UR: 0.2 mg/dL (ref 0.2–1)
pH: 6 (ref 4.6–8.0)

## 2015-02-16 LAB — CBC WITH DIFFERENTIAL/PLATELET
BASO%: 0.4 % (ref 0.0–2.0)
Basophils Absolute: 0 10*3/uL (ref 0.0–0.1)
EOS%: 5.2 % (ref 0.0–7.0)
Eosinophils Absolute: 0.6 10*3/uL — ABNORMAL HIGH (ref 0.0–0.5)
HCT: 31.7 % — ABNORMAL LOW (ref 34.8–46.6)
HGB: 9.8 g/dL — ABNORMAL LOW (ref 11.6–15.9)
LYMPH%: 20.9 % (ref 14.0–49.7)
MCH: 26.8 pg (ref 25.1–34.0)
MCHC: 30.9 g/dL — ABNORMAL LOW (ref 31.5–36.0)
MCV: 86.6 fL (ref 79.5–101.0)
MONO#: 0.9 10*3/uL (ref 0.1–0.9)
MONO%: 8.1 % (ref 0.0–14.0)
NEUT#: 7.1 10*3/uL — ABNORMAL HIGH (ref 1.5–6.5)
NEUT%: 65.4 % (ref 38.4–76.8)
Platelets: 420 10*3/uL — ABNORMAL HIGH (ref 145–400)
RBC: 3.66 10*6/uL — ABNORMAL LOW (ref 3.70–5.45)
RDW: 14.6 % — ABNORMAL HIGH (ref 11.2–14.5)
WBC: 10.9 10*3/uL — ABNORMAL HIGH (ref 3.9–10.3)
lymph#: 2.3 10*3/uL (ref 0.9–3.3)

## 2015-02-16 NOTE — Patient Instructions (Signed)
AT [facility]  (NOT FOR PATIENTS NOT HAVING A PET SCAN ON THE SAME DAY) PATIENT NAME Tamara [name]  Your exam is scheduled for (Monday) February 21, 2015 at [facility] / X-RAY DYE, PLEASE NOTIFY us IMM E DIAT E LY YOU MAY NEED ADDITIONAL PRE-MEDICATIONS THE DAY PRIOR TO YOUR EXAM   Please follow these instructions on the day of your exam.  -Do not eat or drink anything after 11 am (4 hours prior to exam time)  -You will be given two bottles of Readi-Cat oral contrast to drink. The Solution may taste better if refrigerated,                   but do not add ice.                            SHAKE WELL BEFORE DRINKING  -Drink one bottle of Readi-Cat (barium solution) at 1pm (2 hours prior to your exam)  -Drink one bottle of Readi-Cat (barium solution) at 2 pm (1 hours prior to your exam)  You may take medications as prescribed, with a small amount of water, if necessary.     Come directly to the Radiology Department, if your appointment is between 7:00 AM and 5:30 PM Monday through Friday. Go to the Admitting (located on the first floor at the [facility]), if you arrive before 7:00 AM, or between 3:00PM and 5:30PM Monday through Friday. If you arrive after 5:30 PM,  you must register in the Emergency Department as an OUTPATIENT before coming to Radiology  If registering in any area, other than Radiology, notify Radiology reception upon your arrival in the Radiology Department and they will notify the CT Department.  The purpose of you drinking the oral contrast solution is to aid in the visualization of your intestinal tract.  Contrast solution may cause some diarrhea. Before your exam is started, you will be given a small amount                   of water to drink. Depending on your individual set of symptoms, you may also receive an intravenous solution  of x-ray contrast/iodine. Plan on being in  Radiology for 30 to 60 minutes longer, depending on the type of exam you are having performed. If you have any questions regarding your procedure, you may call the Department at [phone] between the hours of 7:00 AM and 10:00 PM.

## 2015-02-16 NOTE — Progress Notes (Signed)
POSTOPERATIVE EVALUATION  HPI:  Tamara [name] is a 44 y.o. year old [id number] initially seen in consultation on 01/24/15 for BRCA 1 deleterious mutation.  She then underwent a robotic hysterectomy, BSO on 02/08/32 without complications.  Her postoperative course was uncomplicated in the hospital and she was discharged on POD1.  Her final pathology revealed benign pathology in the uterus, tubes and ovaries. Washings were negative. She had uterine fibroids.  She is seen today for a postoperative check due to symptoms of severe pelvic pressure with urination. She denies bleeding. She denies fevers. She has been very constipated and had only 1 bm since surgery. She has no emesis or nausea.     Review of systems: Constitutional:  She has no weight gain or weight loss. She has no fever or chills. Eyes: No blurred vision Ears, Nose, Mouth, Throat: No dizziness, headaches or changes in hearing. No mouth sores. Cardiovascular: No chest pain, palpitations or edema. Respiratory:  No shortness of breath, wheezing or cough Gastrointestinal: She has normal bowel movements without diarrhea or constipation. She denies any nausea or vomiting. She denies blood in her stool or heart burn. Genitourinary:  She denies pelvic pain, + pelvic pressure no changes in her urinary function. She has no hematuria, dysuria, or incontinence. She has no irregular vaginal bleeding or vaginal discharge Musculoskeletal: Denies muscle weakness or joint pains.  Skin:  She has no skin changes, rashes or itching Neurological:  Denies dizziness or headaches. No neuropathy, no numbness or tingling. Psychiatric:  She denies depression or anxiety. Hematologic/Lymphatic:   No easy bruising or bleeding   Physical Exam: Blood pressure 124/81, pulse 75, temperature 98.1 F (36.7 C), temperature source Oral, resp. rate 16, height 5' 5" (1.651 m), weight 206 lb 1.6 oz (93.486 kg). General: Well dressed, well nourished in no apparent distress.    HEENT:  Normocephalic and atraumatic, no lesions.  Extraocular muscles intact. Sclerae anicteric. Pupils equal, round, reactive. No mouth sores or ulcers. Thyroid is normal size, not nodular, midline. Skin:  No lesions or rashes. Breasts:  deferred Lungs:  deferred Cardiovascular:  deferred Abdomen:  Soft, nontender, nondistended.  No palpable masses.  No hepatosplenomegaly.  No ascites. Normal bowel sounds.  No hernias.  Incisions are well healed. Genitourinary: Normal EGBUS  Vaginal cuff intact.  No bleeding or discharge.  No obvious cul de sac fullness or drainage. Exam limited due to patient anxiety. Denies pain. Rectal exam limited due to patient discomfort with sphincter penetration. Unable to reach level of cuff. Extremities: No cyanosis, clubbing or edema.  No calf tenderness or erythema. No palpable cords. Psychiatric: Mood and affect are appropriate. Neurological: Awake, alert and oriented x 3. Sensation is intact, no neuropathy.  Musculoskeletal: No pain, normal strength and range of motion.  CBC    Component Value Date/Time   WBC 10.9* 02/16/2015 1244   WBC 11.0* 02/09/2015 0525   RBC 3.66* 02/16/2015 1244   RBC 3.18* 02/09/2015 0525   RBC 2.88* 05/20/2014 1909   HGB 9.8* 02/16/2015 1244   HGB 8.9* 02/09/2015 0525   HCT 31.7* 02/16/2015 1244   HCT 28.5* 02/09/2015 0525   PLT 420* 02/16/2015 1244   PLT 407* 02/09/2015 0525   MCV 86.6 02/16/2015 1244   MCV 89.6 02/09/2015 0525   MCH 26.8 02/16/2015 1244   MCH 28.0 02/09/2015 0525   MCHC 30.9* 02/16/2015 1244   MCHC 31.2 02/09/2015 0525   RDW 14.6* 02/16/2015 1244   RDW 14.0 02/09/2015 0525   LYMPHSABS 2.3  02/16/2015 1244   LYMPHSABS 1.8 02/02/2015 1040   MONOABS 0.9 02/16/2015 1244   MONOABS 0.4 02/02/2015 1040   EOSABS 0.6* 02/16/2015 1244   EOSABS 0.2 02/02/2015 1040   BASOSABS 0.0 02/16/2015 1244   BASOSABS 0.0 02/02/2015 1040     Assessment:    44 y.o. year old with pelvic pressure  S/p robotic  hysterectomy and BSO for BRCA1 deleterious mutation on 02/08/15. No focal abnormalities on exam.  Plan: 1) CT abdo/pelvis to rule out pelvic collection (eg hematoma) that requires drainage. 2) counseled regarding addition of miralax and laxatives to improve bowel function which is likely resulting in some of this pressure symptom. 3)  Return to clinic 2-4 weeks.  Donaciano Eva, MD

## 2015-02-16 NOTE — Telephone Encounter (Signed)
Patient called stating she is having bladder pressure and would like to be seen today. She states if she is unable to get an appt "I will have to go to the emergency room because I can't take this pressure anymore." Patient given appt for lab at 12:45pm and appt with Dr. Denman George at 1:15pm - pt agreeable to these appts.

## 2015-02-17 LAB — URINE CULTURE

## 2015-02-21 ENCOUNTER — Ambulatory Visit (HOSPITAL_COMMUNITY)
Admission: RE | Admit: 2015-02-21 | Discharge: 2015-02-21 | Disposition: A | Discharge status: Home / Self Care | Payer: Medicaid Other | Source: Ambulatory Visit | Attending: Gynecologic Oncology | Admitting: Gynecologic Oncology

## 2015-02-21 DIAGNOSIS — N9489 Other specified conditions associated with female genital organs and menstrual cycle: Secondary | ICD-10-CM | POA: Insufficient documentation

## 2015-02-21 DIAGNOSIS — R102 Pelvic and perineal pain: Secondary | ICD-10-CM

## 2015-02-21 MED ORDER — IOHEXOL 300 MG/ML  SOLN
100.0000 mL | Freq: Once | INTRAMUSCULAR | Status: AC | PRN
  Administered 2015-02-21: 100 mL via INTRAVENOUS

## 2015-02-22 ENCOUNTER — Other Ambulatory Visit (HOSPITAL_BASED_OUTPATIENT_CLINIC_OR_DEPARTMENT_OTHER): Payer: Medicaid Other

## 2015-02-22 ENCOUNTER — Ambulatory Visit (HOSPITAL_BASED_OUTPATIENT_CLINIC_OR_DEPARTMENT_OTHER): Payer: Medicaid Other

## 2015-02-22 ENCOUNTER — Ambulatory Visit (HOSPITAL_BASED_OUTPATIENT_CLINIC_OR_DEPARTMENT_OTHER): Payer: Medicaid Other | Admitting: Nurse Practitioner

## 2015-02-22 ENCOUNTER — Other Ambulatory Visit: Payer: Self-pay | Admitting: Oncology

## 2015-02-22 ENCOUNTER — Encounter: Payer: Self-pay | Admitting: Nurse Practitioner

## 2015-02-22 ENCOUNTER — Encounter: Payer: Self-pay | Admitting: *Deleted

## 2015-02-22 DIAGNOSIS — C50412 Malignant neoplasm of upper-outer quadrant of left female breast: Secondary | ICD-10-CM

## 2015-02-22 DIAGNOSIS — Z5112 Encounter for antineoplastic immunotherapy: Secondary | ICD-10-CM

## 2015-02-22 DIAGNOSIS — R21 Rash and other nonspecific skin eruption: Secondary | ICD-10-CM

## 2015-02-22 DIAGNOSIS — C50811 Malignant neoplasm of overlapping sites of right female breast: Secondary | ICD-10-CM

## 2015-02-22 DIAGNOSIS — Z86718 Personal history of other venous thrombosis and embolism: Secondary | ICD-10-CM

## 2015-02-22 DIAGNOSIS — Z7901 Long term (current) use of anticoagulants: Secondary | ICD-10-CM | POA: Insufficient documentation

## 2015-02-22 HISTORY — DX: Rash and other nonspecific skin eruption: R21

## 2015-02-22 LAB — CBC WITH DIFFERENTIAL/PLATELET
BASO%: 0.3 % (ref 0.0–2.0)
Basophils Absolute: 0 10*3/uL (ref 0.0–0.1)
EOS ABS: 0.7 10*3/uL — AB (ref 0.0–0.5)
EOS%: 7.3 % — ABNORMAL HIGH (ref 0.0–7.0)
HCT: 32.9 % — ABNORMAL LOW (ref 34.8–46.6)
HGB: 10 g/dL — ABNORMAL LOW (ref 11.6–15.9)
LYMPH%: 24.4 % (ref 14.0–49.7)
MCH: 25.9 pg (ref 25.1–34.0)
MCHC: 30.4 g/dL — ABNORMAL LOW (ref 31.5–36.0)
MCV: 85.2 fL (ref 79.5–101.0)
MONO#: 0.5 10*3/uL (ref 0.1–0.9)
MONO%: 5.8 % (ref 0.0–14.0)
NEUT#: 5.6 10*3/uL (ref 1.5–6.5)
NEUT%: 62.2 % (ref 38.4–76.8)
NRBC: 0 % (ref 0–0)
Platelets: 447 10*3/uL — ABNORMAL HIGH (ref 145–400)
RBC: 3.86 10*6/uL (ref 3.70–5.45)
RDW: 14.9 % — ABNORMAL HIGH (ref 11.2–14.5)
WBC: 9 10*3/uL (ref 3.9–10.3)
lymph#: 2.2 10*3/uL (ref 0.9–3.3)

## 2015-02-22 LAB — COMPREHENSIVE METABOLIC PANEL (CC13)
ALT: 15 U/L (ref 0–55)
AST: 12 U/L (ref 5–34)
Albumin: 3.6 g/dL (ref 3.5–5.0)
Alkaline Phosphatase: 93 U/L (ref 40–150)
Anion Gap: 12 mEq/L — ABNORMAL HIGH (ref 3–11)
BUN: 13.4 mg/dL (ref 7.0–26.0)
CALCIUM: 9.4 mg/dL (ref 8.4–10.4)
CHLORIDE: 109 meq/L (ref 98–109)
CO2: 23 meq/L (ref 22–29)
Creatinine: 0.8 mg/dL (ref 0.6–1.1)
EGFR: >90 mL/min/{1.73_m2} (ref >90)
Glucose: 92 mg/dl (ref 70–140)
Potassium: 3.9 mEq/L (ref 3.5–5.1)
Sodium: 143 mEq/L (ref 136–145)
Total Bilirubin: <0.2 mg/dL (ref 0.20–1.20)
Total Protein: 7.2 g/dL (ref 6.4–8.3)

## 2015-02-22 MED ORDER — SODIUM CHLORIDE 0.9 % IV SOLN
Freq: Once | INTRAVENOUS | Status: AC
  Administered 2015-02-22: 12:00:00 via INTRAVENOUS

## 2015-02-22 MED ORDER — SODIUM CHLORIDE 0.9 % IV SOLN
6.0000 mg/kg | Freq: Once | INTRAVENOUS | Status: AC
  Administered 2015-02-22: 567 mg via INTRAVENOUS
  Filled 2015-02-22: qty 27

## 2015-02-22 MED ORDER — ACETAMINOPHEN 325 MG PO TABS
ORAL_TABLET | ORAL | Status: AC
  Filled 2015-02-22: qty 2

## 2015-02-22 MED ORDER — DIPHENHYDRAMINE HCL 25 MG PO CAPS
ORAL_CAPSULE | ORAL | Status: AC
  Filled 2015-02-22: qty 1

## 2015-02-22 MED ORDER — ACETAMINOPHEN 325 MG PO TABS
650.0000 mg | ORAL_TABLET | Freq: Once | ORAL | Status: AC
  Administered 2015-02-22: 650 mg via ORAL

## 2015-02-22 MED ORDER — DIPHENHYDRAMINE HCL 25 MG PO CAPS
25.0000 mg | ORAL_CAPSULE | Freq: Once | ORAL | Status: AC
  Administered 2015-02-22: 25 mg via ORAL

## 2015-02-22 NOTE — Assessment & Plan Note (Signed)
Pt has been experiencing a pruritic rash to her generalized body since right after her hysterectomy on 02/08/15.  Pt reports initiating Mederma scar cream the first post op day of her hysterectomy to decrease surgical scars. She also applied the Mederma cream to arms, legs, chest, and back "for all of the other little scars she has obtained in the past".    She denies any other new medications, lotions, shampoos, foods, etc.    On exam- pt with fine, scattered scabbed rash to chest, back, shoulders, arms and legs.   Pt advised that Mederma may actually be causing pt's rash.  Advised she stop use of Mederma immediately;and start taking Benadryl 25 mg Q 6 hours and Pepcid 20 mgevery 12 hours for allergic rash. Gave pt printed instructions as well.    Advised pt would be following up with her within next 2 days to see how she is doing. Pt was instructed to call/return; or go directly to the ED if she develops any worsening symptoms whatsoever.

## 2015-02-22 NOTE — Progress Notes (Signed)
SYMPTOM MANAGEMENT CLINIC   HPI: Tamara [name] 44 y.o. female diagnosed with breast cancer. Pt is s/p bilat mastectomy with reconstruction. Pt underwent recent hysterectomy on 02/08/15.  Currently undergoing Herceptin therapy.   Pt presented to the cancer center today for her next herceptin infusion. She is c/o a pruritic rash since right after her hysterectomy.  She denies any new medications, lotions, shampoo, or foods.   Pt stated that she has been using Mederma scar cream since the day after surgery to help prevent worsening surgical scarring.  She has also been using the Mederma cream to most of her body as well in hopes of decreasing the other multiple scars from her past.   She denies any other allergic symptoms.   HPI  ROS  Past Medical History  Diagnosis Date  . Endometriosis   . Hypertension   . Anemia   . Wears glasses   . Iron deficiency anemia, unspecified 02/25/2014  . BRCA1 positive     c.190T>G (p.Cys64Gly) @ [facility]   . History of blood transfusion     last 9'15. due to chemotherapy.  Marland Kitchen DVT (deep venous thrombosis)     Right Subclavian and IJ.. Lovenox stopped 01-10-15 until after surgery planned 02-08-15.  . Fibroid tumor     02-02-15 remains with abdominal pain and some vaginal bleeding due to fibroids.  . Breast cancer 02/01/14    ER-/PR-/Her2+, still receiving chemo 12/21/14, last Herceptin 02-02-15.Left breat cancer.  . Shortness of breath      when Hemoglobin low, now resolved after transfusions 9'15.  . Rash 02/22/2015    Past Surgical History  Procedure Laterality Date  . Cervical polypectomy  2010  . Cesarean section      one previous  . Tubal ligation    . Portacath placement Right 02/23/2014    Procedure: INSERTION PORT-A-CATH;  Surgeon: Joyice Faster. Cornett, MD;  Location: [place];  Service: General;  Laterality: Right;  . Port-a-cath removal N/A 05/21/2014    Procedure: REMOVAL PORT-A-CATH;  Surgeon: Zenovia Jarred, MD;  Location: [place];   Service: General;  Laterality: N/A;  . Tee without cardioversion N/A 05/26/2014    Procedure: TRANSESOPHAGEAL ECHOCARDIOGRAM (TEE);  Surgeon: Larey Dresser, MD;  Location: [facility] ENDOSCOPY;  Service: Cardiovascular;  Laterality: N/A;  . Bilateral total mastectomy with axillary lymph node dissection Bilateral 07/28/2014    Procedure: BILATERAL TOTAL MASTECTOMY WITH LEFT AXILLARY SENTINEL LYMPH NODE BIOPSY;  Surgeon: Stark Klein, MD;  Location: [place];  Service: General;  Laterality: Bilateral;  . Breast reconstruction with placement of tissue expander and flex hd (acellular hydrated dermis) Bilateral 07/28/2014    Procedure: BILATERAL BREAST RECONSTRUCTION WITH PLACEMENT OF TISSUE EXPANDER AND FLEX HD (ACELLULAR HYDRATED DERMIS);  Surgeon: Theodoro Kos, DO;  Location: [place];  Service: Plastics;  Laterality: Bilateral;  . Removal of tissue expander and placement of implant Bilateral 10/14/2014    Procedure: REMOVAL OF BILATERAL BREAST  TISSUE EXPANDER  WITH PLASCEMENT OF BILATERAL  BREAST IMPLANTS;  Surgeon: Theodoro Kos, DO;  Location: [place];  Service: Plastics;  Laterality: Bilateral;  . Left heart catheterization with coronary angiogram N/A 09/13/2014    Procedure: LEFT HEART CATHETERIZATION WITH CORONARY ANGIOGRAM;  Surgeon: Jolaine Artist, MD;  Location: [facility] CATH LAB;  Service: Cardiovascular;  Laterality: N/A;  . Cardiac catheterization      10'15- Dr. Sung Amabile  . 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; BRCA1 positive;  Palpitations; Staphylococcus aureus bacteremia with sepsis; Sinus tachycardia; AKI (acute kidney injury); Acute blood loss anemia; DVT (deep venous thrombosis) right IJ/subclavian; Bloodstream infection due to port-a-cath; Breast cancer; Anemia; Symptomatic anemia; Absence of breast; Fever; Neoplasm related pain; Cardiomyopathy, ischemic; Cardiomyopathy; New daily persistent headache; Essential hypertension, benign; Moderate recurrent major depression; Heavy menstrual bleeding; 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; Rash; and Long term current use of anticoagulant therapy on her problem list.    is allergic to compazine.    Medication List       This list is accurate as of: 02/22/15 10:32 PM.  Always use your most recent med list.               acetaminophen 325 MG tablet  Commonly known as:  TYLENOL  Take 2 tablets (650 mg total) by mouth every 6 (six) hours as needed for mild pain (or Temp > 100).     carvedilol 12.5 MG tablet  Commonly known as:  COREG  Take 1 tablet (12.5 mg total) by mouth 2 (two) times daily with a meal.     cyclobenzaprine 10 MG tablet  Commonly known as:  FLEXERIL  Take 1 tablet (10 mg total) by mouth 3 (three) times daily as needed for muscle spasms.     diazepam 2 MG tablet  Commonly known as:  VALIUM  Take 1 tablet (2 mg total) by mouth every 6 (six) hours as needed for anxiety or muscle spasms.     DULoxetine 30 MG capsule  Commonly known as:  CYMBALTA  Take 1 capsule (30 mg total) by mouth daily.     enoxaparin 40 MG/0.4ML injection  Commonly known as:  LOVENOX  Inject 0.4 mLs (40 mg total) into the skin daily.     ferrous sulfate 325 (65 FE) MG tablet  Take 325 mg by mouth 3 (three) times daily with meals.     hydrALAZINE 50 MG tablet  Commonly known as:  APRESOLINE  Take 1 tablet (50 mg total) by mouth every 8 (eight) hours.     hydrochlorothiazide 12.5 MG capsule  Commonly known as:  MICROZIDE  Take 1 capsule (12.5 mg total) by mouth daily.      ibuprofen 800 MG tablet  Commonly known as:  ADVIL,MOTRIN  Take 800 mg by mouth every 8 (eight) hours as needed (pain).     ipratropium 0.03 % nasal spray  Commonly known as:  ATROVENT  Place 2 sprays into the nose 2 (two) times daily. PRN congestion     lidocaine-prilocaine cream  Commonly known as:  EMLA  Apply 1 application topically as needed (For port-a-cath.).     lisinopril 40 MG tablet  Commonly known as:  PRINIVIL,ZESTRIL  Take 1 tablet (40 mg total) by mouth daily.     LORazepam 0.5 MG tablet  Commonly known as:  ATIVAN  Take 1 tablet (0.5 mg total) by mouth every 6 (six) hours as needed (Nausea or vomiting).     ondansetron 8 MG tablet  Commonly known as:  ZOFRAN  Take 8 mg by mouth 2 (two) times daily as needed for nausea or vomiting.     oxyCODONE  5 MG immediate release tablet  Commonly known as:  Oxy IR/ROXICODONE  Take 1-2 tablets (5-10 mg total) by mouth every 4 (four) hours as needed for severe pain.     PRESCRIPTION MEDICATION  Chemo at [facility]  Oncology Vitals 02/22/2015 02/16/2015 02/09/2015 02/09/2015 02/09/2015 02/08/2015 02/08/2015  Height - 165 cm - - - - -  Weight - 93.486 kg - - - - -  Weight (lbs) - 206 lbs 2 oz - - - - -  BMI (kg/m2) - 34.3 kg/m2 - - - - -  Temp 98.5 98.1 98.3 98.7 98.7 98.9 98.4  Pulse 69 75 70 79 74 69 66  Resp 18 16 14 16 16 15  -  SpO2 100 - 100 98 100 98 98  BSA (m2) - 2.07 m2 - - - - -   BP Readings from Last 3 Encounters:  02/22/15 154/88  02/16/15 124/81  02/01/15 126/81    Physical Exam  Constitutional: She is oriented to person, place, and time and well-developed, well-nourished, and in no distress.  HENT:  Head: Normocephalic and atraumatic.  Eyes: Conjunctivae and EOM are normal. Pupils are equal, round, and reactive to light. Right eye exhibits no discharge. Left eye exhibits no discharge. No scleral icterus.  Neck: Normal range of motion.  Pulmonary/Chest: Effort normal. No respiratory  distress.  Musculoskeletal: Normal range of motion.  Neurological: She is alert and oriented to person, place, and time.  Skin: Skin is warm and dry. Rash noted.  Pt with fine, scattered, scabbed rash to chest, back, shoulders, arms and legs.  No active infection noted.   Psychiatric: Affect normal.  Nursing note and vitals reviewed.   LABORATORY DATA:. Appointment on 02/22/2015  Component Date Value Ref Range Status  . WBC 02/22/2015 9.0  3.9 - 10.3 10e3/uL Final  . NEUT# 02/22/2015 5.6  1.5 - 6.5 10e3/uL Final  . HGB 02/22/2015 10.0* 11.6 - 15.9 g/dL Final  . HCT 02/22/2015 32.9* 34.8 - 46.6 % Final  . Platelets 02/22/2015 447* 145 - 400 10e3/uL Final  . MCV 02/22/2015 85.2  79.5 - 101.0 fL Final  . MCH 02/22/2015 25.9  25.1 - 34.0 pg Final  . MCHC 02/22/2015 30.4* 31.5 - 36.0 g/dL Final  . RBC 02/22/2015 3.86  3.70 - 5.45 10e6/uL Final  . RDW 02/22/2015 14.9* 11.2 - 14.5 % Final  . lymph# 02/22/2015 2.2  0.9 - 3.3 10e3/uL Final  . MONO# 02/22/2015 0.5  0.1 - 0.9 10e3/uL Final  . Eosinophils Absolute 02/22/2015 0.7* 0.0 - 0.5 10e3/uL Final  . Basophils Absolute 02/22/2015 0.0  0.0 - 0.1 10e3/uL Final  . NEUT% 02/22/2015 62.2  38.4 - 76.8 % Final  . LYMPH% 02/22/2015 24.4  14.0 - 49.7 % Final  . MONO% 02/22/2015 5.8  0.0 - 14.0 % Final  . EOS% 02/22/2015 7.3* 0.0 - 7.0 % Final  . BASO% 02/22/2015 0.3  0.0 - 2.0 % Final  . nRBC 02/22/2015 0  0 - 0 % Final  . Sodium 02/22/2015 143  136 - 145 mEq/L Final  . Potassium 02/22/2015 3.9  3.5 - 5.1 mEq/L Final  . Chloride 02/22/2015 109  98 - 109 mEq/L Final  . CO2 02/22/2015 23  22 - 29 mEq/L Final  . Glucose 02/22/2015 92  70 - 140 mg/dl Final  . BUN 02/22/2015 13.4  7.0 - 26.0 mg/dL Final  . Creatinine 02/22/2015 0.8  0.6 - 1.1  mg/dL Final  . Total Bilirubin 02/22/2015 <0.20  0.20 - 1.20 mg/dL Final  . Alkaline Phosphatase 02/22/2015 93  40 - 150 U/L Final  . AST 02/22/2015 12  5 - 34 U/L Final  . ALT 02/22/2015 15  0 - 55 U/L  Final  . Total Protein 02/22/2015 7.2  6.4 - 8.3 g/dL Final  . Albumin 02/22/2015 3.6  3.5 - 5.0 g/dL Final  . Calcium 02/22/2015 9.4  8.4 - 10.4 mg/dL Final  . Anion Gap 02/22/2015 12* 3 - 11 mEq/L Final  . EGFR 02/22/2015 >90  >90 ml/min/1.73 m2 Final   eGFR is calculated using the CKD-EPI Creatinine Equation (2009)     RADIOGRAPHIC STUDIES: Ct Abdomen Pelvis W Contrast  02/21/2015   CLINICAL DATA:  44 year old female with history of recent hysterectomy (02/09/2015), complaining of increasing pelvic pressure particularly during urination since the time of surgery. Additional history of left-sided breast cancer status post bilateral mastectomy.  EXAM: CT ABDOMEN AND PELVIS WITH CONTRAST  TECHNIQUE: Multidetector CT imaging of the abdomen and pelvis was performed using the standard protocol following bolus administration of intravenous contrast.  CONTRAST:  132m OMNIPAQUE IOHEXOL 300 MG/ML  SOLN  COMPARISON:  PET-CT 02/19/2014.  FINDINGS: Lower chest: Postoperative changes of bilateral modified radical mastectomy and breast reconstruction incompletely visualized.  Hepatobiliary: No cystic or solid hepatic lesions. No intra or extrahepatic biliary ductal dilatation. Gallbladder is nearly completely decompressed, but otherwise unremarkable in appearance.  Pancreas: Unremarkable.  Spleen: Unremarkable.  Adrenals/Urinary Tract: Bilateral adrenal glands and bilateral kidneys are normal in appearance. No hydroureteronephrosis. Urinary bladder is normal in appearance.  Stomach/Bowel: Normal appearance of the stomach. No pathologic dilatation of small bowel or colon. Normal appendix.  Vascular/Lymphatic: No significant atherosclerotic disease, aneurysm or dissection identified in the abdominal or pelvic vasculature.  Reproductive: Status post total abdominal hysterectomy and bilateral salpingo-oophorectomy. There is some soft tissue stranding in the surgical bed, and a trace amount of postoperative fluid, with  the collection measuring approximately 2.5 x 1.1 x 2.4 cm (images 80 of series 2 and 50 for of coronal series 602), which appears somewhat amorphous and is low-attenuation with some peripheral rim enhancement. This does not appear to exert significant mass effect upon adjacent structures, and is favored to represent a resolving postoperative fluid collection such as a seroma. No well-defined rim enhancing fluid collection exerting mass effect upon adjacent structures to strongly suggest the presence of an abscess at this time.  Other: Trace volume of ascites in the low pelvis, likely reactive. No larger volume of ascites. No pneumoperitoneum. Small amount of gas and fluid in the subcutaneous fat of the lower right anterior abdominal wall, likely iatrogenic from recent injection.  Musculoskeletal: There are no aggressive appearing lytic or blastic lesions noted in the visualized portions of the skeleton.  IMPRESSION: 1. Postoperative changes of recent total abdominal hysterectomy and bilateral salpingo-oophorectomy with some resolving postoperative inflammation and a tiny postoperative fluid collection in the low anatomic pelvis adjacent to the vaginal cuff, which is favored to reflect a resolving postoperative seroma. The possibility of phlegmon is not excluded, but no discrete abscess is identified at this time. 2. Normal appendix. 3. Additional incidental findings, as above.   Electronically Signed   By: DVinnie LangtonM.D.   On: 02/21/2015 17:08    ASSESSMENT/PLAN:    Breast cancer of upper-outer quadrant of left female breast Pt is status post bilat mastectomy with reconstruction; and recently underwent hysterectomy on 02/08/15.  She is recovering  well from her most recent surgery.   She is currently undergoing Herceptin on an every 3 week basis. Labs obtained today were essentially stable. Pt to proceed today with her next cycle of herceptin.    She is scheduled for an ECHO on 03/01/15.  She is  scheduled for labs and her next cycle of herceptin on 03/15/15.     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    Rash Pt has been experiencing a pruritic rash to her generalized body since right after her hysterectomy on 02/08/15.  Pt reports initiating Mederma scar cream the first post op day of her hysterectomy to decrease surgical scars. She also applied the Mederma cream to arms, legs, chest, and back "for all of the other little scars she has obtained in the past".    She denies any other new medications, lotions, shampoos, foods, etc.    On exam- pt with fine, scattered scabbed rash to chest, back, shoulders, arms and legs.   Pt advised that Mederma may actually be causing pt's rash.  Advised she stop use of Mederma immediately;and start taking Benadryl 25 mg Q 6 hours and Pepcid 20 mgevery 12 hours for allergic rash. Gave pt printed instructions as well.    Advised pt would be following up with her within next 2 days to see how she is doing. Pt was instructed to call/return; or go directly to the ED if she develops any worsening symptoms whatsoever.        CT obtained yesterday per Dr. Denman George revealed resolving post op seroma.  All CT results will be reviewed with pt by Dr. Denman George and/or Dr. Jana Hakim.   Patient stated understanding of all instructions; and was in agreement with this plan of care. The patient knows to call the clinic with any problems, questions or concerns.   Review/collaboration with Dr. Jana Hakim regarding all aspects of patient's visit today.   Total time spent with patient was 25 minutes;  with greater than 75 percent of that time spent in face to face counseling regarding patient's symptoms,  and coordination of care and follow up.  Disclaimer: This note was dictated with voice recognition software. Similar sounding words can inadvertently be transcribed and may not be corrected  upon review.   Drue Second, NP 02/22/2015

## 2015-02-22 NOTE — Patient Instructions (Signed)
[facility] Discharge Instructions for Patients  Today you received the following: Herceptin   To help prevent nausea and vomiting after your treatment, we encourage you to take your nausea medication as directed.   If you develop nausea and vomiting that is not controlled by your nausea medication, call the clinic.   BELOW ARE SYMPTOMS THAT SHOULD BE REPORTED IMMEDIATELY:  *FEVER GREATER THAN 100.5 F  *CHILLS WITH OR WITHOUT FEVER  NAUSEA AND VOMITING THAT IS NOT CONTROLLED WITH YOUR NAUSEA MEDICATION  *UNUSUAL SHORTNESS OF BREATH  *UNUSUAL BRUISING OR BLEEDING  TENDERNESS IN MOUTH AND THROAT WITH OR WITHOUT PRESENCE OF ULCERS  *URINARY PROBLEMS  *BOWEL PROBLEMS  UNUSUAL RASH Items with * indicate a potential emergency and should be followed up as soon as possible.  Feel free to call the clinic you have any questions or concerns. The clinic phone number is [phone].  Please show the CHEMO ALERT CARD at check-in to the Emergency Department and triage nurse.   

## 2015-02-22 NOTE — Progress Notes (Signed)
Pt reports having rash to arms, chest, back, and abdomen since hysterectomy surgery on 02/08/15.  Reports itching as well.  Dr. Jana Hakim notified and okay to proceed with Herceptin today with ECHO from 10/20/14.  Dr. Jana Hakim requests Selena Lesser, NP see patient while in infusion.  Selena Lesser, NP notified and at chairside to assess patient.

## 2015-02-22 NOTE — Assessment & Plan Note (Signed)
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

## 2015-02-22 NOTE — Assessment & Plan Note (Signed)
Pt is status post bilat mastectomy with reconstruction; and recently underwent hysterectomy on 02/08/15.  She is recovering well from her most recent surgery.   She is currently undergoing Herceptin on an every 3 week basis. Labs obtained today were essentially stable. Pt to proceed today with her next cycle of herceptin.    She is scheduled for an ECHO on 03/01/15.  She is scheduled for labs and her next cycle of herceptin on 03/15/15.

## 2015-02-23 ENCOUNTER — Telehealth: Payer: Self-pay | Admitting: Nurse Practitioner

## 2015-02-23 ENCOUNTER — Other Ambulatory Visit: Payer: Self-pay | Admitting: Nurse Practitioner

## 2015-02-23 DIAGNOSIS — R300 Dysuria: Secondary | ICD-10-CM

## 2015-02-23 MED ORDER — PHENAZOPYRIDINE HCL 200 MG PO TABS: 200.0000 mg | ORAL_TABLET | Freq: Three times a day (TID) | ORAL | Status: DC

## 2015-02-23 MED ORDER — PHENAZOPYRIDINE HCL 200 MG PO TABS
200.0000 mg | ORAL_TABLET | Freq: Three times a day (TID) | ORAL | Status: DC
Start: 2015-02-23 — End: 2015-03-30

## 2015-02-23 NOTE — Progress Notes (Signed)
Pyridium 200 mg TID after meals PRN dysuria #15, 0 refills called to [facility] per Joylene John, NP. Office phone and patient information given to pharmacy.

## 2015-02-23 NOTE — Telephone Encounter (Signed)
Per Joylene John, NP, called patient to inform her of small amount of fluid present in surgical area contributing to patient's c/o pressure with voiding. Patient informed that this is normal with surgery and will resolve; she has been made aware that there is no infection or abcess per NP. Patient reports no change in left sided tenderness, present since surgery, and no relief from pelvic pressure.  She does, however, note new onset of burning during urination.  Rn reported to NP. Patient informed we will review this with Dr. Denman George and call her back with further instructions.   Per Dr. Denman George, rx for pyridium sent to preferred pharmacy. Patient can take 200 mg TID after meals PRN for dysuria, #15. Patient also informed that her urine cx was negative per Joylene John, NP. Patient verbalizes understanding and thanks for the call.

## 2015-02-24 ENCOUNTER — Telehealth: Payer: Self-pay | Admitting: *Deleted

## 2015-02-24 NOTE — Telephone Encounter (Signed)
Called pt & she reports that rash is clearing & she is not itching.  She has been using benadryl & pepcid as ordered & reports that she knows she can call if she is having any problems or concerns.

## 2015-03-01 ENCOUNTER — Ambulatory Visit (HOSPITAL_COMMUNITY)
Admission: RE | Admit: 2015-03-01 | Discharge: 2015-03-01 | Disposition: A | Discharge status: Home / Self Care | Payer: Medicaid Other | Source: Ambulatory Visit | Attending: Internal Medicine | Admitting: Internal Medicine

## 2015-03-01 ENCOUNTER — Ambulatory Visit (HOSPITAL_COMMUNITY)
Admission: RE | Admit: 2015-03-01 | Discharge: 2015-03-01 | Disposition: A | Discharge status: Home / Self Care | Payer: Medicaid Other | Source: Ambulatory Visit | Attending: Nurse Practitioner | Admitting: Nurse Practitioner

## 2015-03-01 VITALS — BP 126/84 | HR 81 | Wt 206.8 lb

## 2015-03-01 DIAGNOSIS — C50412 Malignant neoplasm of upper-outer quadrant of left female breast: Secondary | ICD-10-CM

## 2015-03-01 DIAGNOSIS — I429 Cardiomyopathy, unspecified: Secondary | ICD-10-CM

## 2015-03-01 DIAGNOSIS — I82409 Acute embolism and thrombosis of unspecified deep veins of unspecified lower extremity: Secondary | ICD-10-CM | POA: Diagnosis not present

## 2015-03-01 DIAGNOSIS — Z5111 Encounter for antineoplastic chemotherapy: Secondary | ICD-10-CM | POA: Diagnosis not present

## 2015-03-01 DIAGNOSIS — I427 Cardiomyopathy due to drug and external agent: Secondary | ICD-10-CM | POA: Diagnosis not present

## 2015-03-01 DIAGNOSIS — I10 Essential (primary) hypertension: Secondary | ICD-10-CM | POA: Diagnosis not present

## 2015-03-01 DIAGNOSIS — I313 Pericardial effusion (noninflammatory): Secondary | ICD-10-CM | POA: Diagnosis not present

## 2015-03-01 DIAGNOSIS — T451X5A Adverse effect of antineoplastic and immunosuppressive drugs, initial encounter: Secondary | ICD-10-CM | POA: Diagnosis not present

## 2015-03-01 MED ORDER — CARVEDILOL 12.5 MG PO TABS: 18.7500 mg | ORAL_TABLET | Freq: Two times a day (BID) | ORAL | Status: DC

## 2015-03-01 MED ORDER — HYDRALAZINE HCL 50 MG PO TABS: 50.0000 mg | ORAL_TABLET | Freq: Three times a day (TID) | ORAL | Status: DC

## 2015-03-01 MED ORDER — LISINOPRIL 40 MG PO TABS: 40.0000 mg | ORAL_TABLET | Freq: Every day | ORAL | Status: DC

## 2015-03-01 MED ORDER — HYDROCHLOROTHIAZIDE 12.5 MG PO CAPS: 12.5000 mg | ORAL_CAPSULE | Freq: Every day | ORAL | Status: DC

## 2015-03-01 NOTE — Patient Instructions (Addendum)
INCREASE Carvedilol to 18.75 mg, one and one half tab twice a day  Your physician recommends that you schedule a follow-up appointment in: 2 months with a echocardiogram  Your physician has requested that you have an echocardiogram. Echocardiography is a painless test that uses sound waves to create images of your heart. It provides your doctor with information about the size and shape of your heart and how well your heart's chambers and valves are working. This procedure takes approximately one hour. There are no restrictions for this procedure.

## 2015-03-01 NOTE — Progress Notes (Signed)
Patient ID: Tamara [name], female   DOB: Oct 12, 1971, 44 y.o.   MRN: [id number] Referring Physician: Dr. Jana Hakim Primary Care: [place] [facility] Cardiologist: N/A Plastic Surgeon: Dr Migdalia Dk  HPI: Tamara [name] is a 44 yo with a history of HTN and chronic anemia. She was diagnosed with L breast cancer in 3/15. 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 Lymph node was positive for metastatic disease.She is S/P Bilateral Mastectomy with reconstruction 09/02/2014.   Was treated with Taxotere and carboplatinum + Herceptin/perjeta x 6 cycles - stopped in 7/15 due to low EF.   Admitted with MSSA bacteremia due to port infection. Completed IV abx. TEE in 6/15 with EF 50-55%. 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   She retturns for follow up. Back on herceptin every 3 weeks . Denies SOB/PND/Orthopnea. Does admit to mild dyspnea with exertion. Taking all medicaitons.   Labs (10/15): K 3.9, creatinine 0.9 Labs 02/22/2015: K 3.9 Creatinine 0.8  LHC 09/2014 Coronaries OK   CMRI 12/2014: EF 56% No wall abnormality noted. RV ok. No infiltrative disease noted.   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 + inferior lateral wall aneurysm. Small pericardial effusion  ECHO 11/15: EF 55%, basal anterolateral hypokinesis (basal inferolateral wall not well-visualized), lateral S' 13.6, images not adequate to assess strain.  ECHO 03/01/2015: EF 45% lateral s' -9.1 GLS -19.6  SH: Does not smoke or drink  FH: Mother diagnosed breast CA at age 42, living        2 paternal aunts - breast cancer (1  deceased and 1 living)        Father- Deceased at age 25 of cancer not sure what kind        - Has 42 yo, 42 yo, 42 yo, 42 yo and 42 yo (3 boys and 2 girls)  ROS: All systems reviewed and negative except as per HPI.    Past Medical History  Diagnosis Date  . Endometriosis   . Hypertension   . Anemia   . Wears glasses   . Iron deficiency anemia, unspecified 02/25/2014  . BRCA1 positive     c.190T>G (p.Cys64Gly) @ [facility]   . History of blood transfusion     last 9'15. due to chemotherapy.  Marland Kitchen DVT (deep venous thrombosis)     Right Subclavian and IJ.. Lovenox stopped 01-10-15 until after surgery planned 02-08-15.  . Fibroid tumor     02-02-15 remains with abdominal pain and some vaginal bleeding due to fibroids.  . Breast cancer 02/01/14    ER-/PR-/Her2+, still receiving chemo 12/21/14, last Herceptin 02-02-15.Left breat cancer.  . Shortness of breath      when Hemoglobin low, now resolved after transfusions 9'15.  . Rash 02/22/2015    Current Outpatient Prescriptions  Medication Sig Dispense Refill  . acetaminophen (TYLENOL) 325 MG tablet Take 2 tablets (650 mg total) by mouth every 6 (six) hours as needed for mild pain (or Temp > 100).    . carvedilol (COREG) 12.5 MG tablet Take 1 tablet (12.5 mg total) by mouth 2 (  two) times daily with a meal. 60 tablet 3  . cyclobenzaprine (FLEXERIL) 10 MG tablet Take 1 tablet (10 mg total) by mouth 3 (three) times daily as needed for muscle spasms. 30 tablet 0  . diazepam (VALIUM) 2 MG tablet Take 1 tablet (2 mg total) by mouth every 6 (six) hours as needed for anxiety or muscle spasms. 30 tablet 0  . DULoxetine (CYMBALTA) 30 MG capsule Take 1 capsule (30 mg total) by mouth daily. 30 capsule 3  . enoxaparin (LOVENOX) 40 MG/0.4ML injection Inject 0.4 mLs (40 mg total) into the skin daily. 27 Syringe 0  . ferrous sulfate 325 (65 FE) MG tablet Take 325 mg by mouth 3 (three) times daily with meals.    . hydrALAZINE (APRESOLINE) 50 MG tablet Take 1 tablet (50 mg  total) by mouth every 8 (eight) hours. 90 tablet 3  . hydrochlorothiazide (MICROZIDE) 12.5 MG capsule Take 1 capsule (12.5 mg total) by mouth daily. 90 capsule 3  . ibuprofen (ADVIL,MOTRIN) 800 MG tablet Take 800 mg by mouth every 8 (eight) hours as needed (pain).    Marland Kitchen ipratropium (ATROVENT) 0.03 % nasal spray Place 2 sprays into the nose 2 (two) times daily. PRN congestion 30 mL 0  . lidocaine-prilocaine (EMLA) cream Apply 1 application topically as needed (For port-a-cath.). 30 g 1  . lisinopril (PRINIVIL,ZESTRIL) 40 MG tablet Take 1 tablet (40 mg total) by mouth daily. 90 tablet 3  . LORazepam (ATIVAN) 0.5 MG tablet Take 1 tablet (0.5 mg total) by mouth every 6 (six) hours as needed (Nausea or vomiting). 30 tablet 0  . ondansetron (ZOFRAN) 8 MG tablet Take 8 mg by mouth 2 (two) times daily as needed for nausea or vomiting.     Marland Kitchen oxyCODONE (OXY IR/ROXICODONE) 5 MG immediate release tablet Take 1-2 tablets (5-10 mg total) by mouth every 4 (four) hours as needed for severe pain. 50 tablet 0  . phenazopyridine (PYRIDIUM) 200 MG tablet Take 1 tablet (200 mg total) by mouth 3 (three) times daily after meals. 15 tablet 0  . PRESCRIPTION MEDICATION Chemo at [facility]     No current facility-administered medications for this encounter.    Allergies  Allergen Reactions  . Compazine [Prochlorperazine Edisylate] Other (See Comments)    Stuttering     Filed Vitals:   03/01/15 1202  BP: 126/84  Pulse: 81  Weight: 206 lb 12.8 oz (93.804 kg)  SpO2: 98%   PHYSICAL EXAM: General:  Well appearing. No respiratory difficulty HEENT: normal Neck: supple. no JVD. Carotids 2+ bilat; no bruits. No lymphadenopathy or thryomegaly appreciated. Cor: PMI nondisplaced. Regular rate & rhythm. No rubs, gallops 2/6 TR  Lungs: clear Abdomen: soft, nontender, nondistended. No hepatosplenomegaly. No bruits or masses. Good bowel sounds. Extremities: no cyanosis, clubbing, rash, edema  Neuro: alert & oriented x 3,  cranial nerves grossly intact. moves all 4 extremities w/o difficulty. Affect pleasant.  ASSESSMENT & PLAN:  1) Cardiomyopathy:  This seems to have pre-dated Herceptin use.  .  It was difficult given breast implants.  EF appeared somewhat improved at 55%.  There was basal anterolateral hypokinesis but the basal inferolateral wall was poorly visualized. Lateral S' was better than in the past but images were not adequate for strain. LHC showed no significant coronary disease.  01/2015 C-Mri was ok no infiltrative disease or wall abnormality.   ECHO today is down today and discussed by Dr Haroldine Laws  - Increase Coreg to 18.75  mg bid. - Continue lisinopril at  40 mg daily  - 2) Breast Cancer L:  HER-2/neu positive with a proliferation marker Ki-67 79%.  Back on herceptin. Will need to stop due to recurrent cardiomyopathy.  4) HTN BP high.  Increasing Coreg as above.  5) DVT Continue Lovenox 6) Pericardial effusion  No significant effusion on today's echo.   Follow up in 2 months with an ECHO.  CLEGG,AMY 03/01/2015  Patient seen and examined with Darrick Grinder, NP. We discussed all aspects of the encounter. I agree with the assessment and plan as stated above.   Echo reviewed personally in clinic and EF is back down to 45%. Recommend stopping herceptin. (I have left a message with Dr. Jana Hakim). Agree with increasing carvedilol. Repeat echo in 2 months.   Benay Spice 7:36 PM

## 2015-03-01 NOTE — Progress Notes (Signed)
  Echocardiogram 2D Echocardiogram has been performed.  Donata Clay 03/01/2015, 11:58 AM

## 2015-03-03 ENCOUNTER — Telehealth: Payer: Self-pay | Admitting: Oncology

## 2015-03-03 ENCOUNTER — Telehealth: Payer: Self-pay | Admitting: *Deleted

## 2015-03-03 ENCOUNTER — Emergency Department (HOSPITAL_COMMUNITY): Payer: Medicaid Other

## 2015-03-03 ENCOUNTER — Other Ambulatory Visit: Payer: Self-pay

## 2015-03-03 ENCOUNTER — Encounter (HOSPITAL_COMMUNITY): Payer: Self-pay | Admitting: Nurse Practitioner

## 2015-03-03 ENCOUNTER — Emergency Department (HOSPITAL_COMMUNITY)
Admission: EM | Admit: 2015-03-03 | Discharge: 2015-03-04 | Disposition: A | Discharge status: Home / Self Care | Payer: Medicaid Other | Attending: Emergency Medicine | Admitting: Emergency Medicine

## 2015-03-03 ENCOUNTER — Other Ambulatory Visit: Payer: Self-pay | Admitting: Oncology

## 2015-03-03 DIAGNOSIS — I10 Essential (primary) hypertension: Secondary | ICD-10-CM | POA: Insufficient documentation

## 2015-03-03 DIAGNOSIS — Z87448 Personal history of other diseases of urinary system: Secondary | ICD-10-CM | POA: Insufficient documentation

## 2015-03-03 DIAGNOSIS — Z79899 Other long term (current) drug therapy: Secondary | ICD-10-CM | POA: Diagnosis not present

## 2015-03-03 DIAGNOSIS — Z862 Personal history of diseases of the blood and blood-forming organs and certain disorders involving the immune mechanism: Secondary | ICD-10-CM | POA: Diagnosis not present

## 2015-03-03 DIAGNOSIS — Z86718 Personal history of other venous thrombosis and embolism: Secondary | ICD-10-CM | POA: Diagnosis not present

## 2015-03-03 DIAGNOSIS — R079 Chest pain, unspecified: Secondary | ICD-10-CM | POA: Diagnosis present

## 2015-03-03 DIAGNOSIS — Z9889 Other specified postprocedural states: Secondary | ICD-10-CM | POA: Diagnosis not present

## 2015-03-03 DIAGNOSIS — Z87891 Personal history of nicotine dependence: Secondary | ICD-10-CM | POA: Diagnosis not present

## 2015-03-03 DIAGNOSIS — R0789 Other chest pain: Secondary | ICD-10-CM | POA: Insufficient documentation

## 2015-03-03 DIAGNOSIS — Z853 Personal history of malignant neoplasm of breast: Secondary | ICD-10-CM | POA: Insufficient documentation

## 2015-03-03 LAB — CBC WITH DIFFERENTIAL/PLATELET
BASOS ABS: 0 10*3/uL (ref 0.0–0.1)
BASOS PCT: 0 % (ref 0–1)
EOS ABS: 0.9 10*3/uL — AB (ref 0.0–0.7)
EOS PCT: 10 % — AB (ref 0–5)
HCT: 34.6 % — ABNORMAL LOW (ref 36.0–46.0)
Hemoglobin: 10.4 g/dL — ABNORMAL LOW (ref 12.0–15.0)
LYMPHS PCT: 30 % (ref 12–46)
Lymphs Abs: 2.7 10*3/uL (ref 0.7–4.0)
MCH: 25.6 pg — AB (ref 26.0–34.0)
MCHC: 30.1 g/dL (ref 30.0–36.0)
MCV: 85.2 fL (ref 78.0–100.0)
Monocytes Absolute: 0.6 10*3/uL (ref 0.1–1.0)
Monocytes Relative: 6 % (ref 3–12)
Neutro Abs: 5 10*3/uL (ref 1.7–7.7)
Neutrophils Relative %: 54 % (ref 43–77)
PLATELETS: 491 10*3/uL — AB (ref 150–400)
RBC: 4.06 MIL/uL (ref 3.87–5.11)
RDW: 16 % — AB (ref 11.5–15.5)
WBC: 9.2 10*3/uL (ref 4.0–10.5)

## 2015-03-03 NOTE — ED Provider Notes (Addendum)
CSN: [id number]     Arrival date & time 03/03/15  2036 History   First MD Initiated Contact with Patient 03/03/15 2301     Chief Complaint  Patient presents with  . Chest Pain     (Consider location/radiation/quality/duration/timing/severity/associated sxs/prior Treatment) Patient is a 44 y.o. female presenting with chest pain. The history is provided by the patient.  Chest Pain Pain location:  L chest and R chest Pain quality: aching   Pain radiates to:  Does not radiate Pain radiates to the back: no   Pain severity:  Moderate Onset quality:  Sudden Timing:  Sporadic Progression:  Resolved Chronicity:  Chronic Context: at rest   Relieved by:  Nothing Worsened by:  Nothing tried Ineffective treatments:  None tried Associated symptoms: no abdominal pain, no back pain, no cough, no dizziness, no fatigue, no fever, no headache, no nausea, no shortness of breath and not vomiting     Past Medical History  Diagnosis Date  . Endometriosis   . Hypertension   . Anemia   . Wears glasses   . Iron deficiency anemia, unspecified 02/25/2014  . BRCA1 positive     c.190T>G (p.Cys64Gly) @ [facility]   . History of blood transfusion     last 9'15. due to chemotherapy.  Marland Kitchen DVT (deep venous thrombosis)     Right Subclavian and IJ.. Lovenox stopped 01-10-15 until after surgery planned 02-08-15.  . Fibroid tumor     02-02-15 remains with abdominal pain and some vaginal bleeding due to fibroids.  . Breast cancer 02/01/14    ER-/PR-/Her2+, still receiving chemo 12/21/14, last Herceptin 02-02-15.Left breat cancer.  . Shortness of breath      when Hemoglobin low, now resolved after transfusions 9'15.  . Rash 02/22/2015   Past Surgical History  Procedure Laterality Date  . Cervical polypectomy  2010  . Cesarean section      one previous  . Tubal ligation    . Portacath placement Right 02/23/2014    Procedure: INSERTION PORT-A-CATH;  Surgeon: Joyice Faster. Cornett, MD;  Location: [place];   Service: General;  Laterality: Right;  . Port-a-cath removal N/A 05/21/2014    Procedure: REMOVAL PORT-A-CATH;  Surgeon: Zenovia Jarred, MD;  Location: [place];  Service: General;  Laterality: N/A;  . Tee without cardioversion N/A 05/26/2014    Procedure: TRANSESOPHAGEAL ECHOCARDIOGRAM (TEE);  Surgeon: Larey Dresser, MD;  Location: [facility] ENDOSCOPY;  Service: Cardiovascular;  Laterality: N/A;  . Bilateral total mastectomy with axillary lymph node dissection Bilateral 07/28/2014    Procedure: BILATERAL TOTAL MASTECTOMY WITH LEFT AXILLARY SENTINEL LYMPH NODE BIOPSY;  Surgeon: Stark Klein, MD;  Location: [place];  Service: General;  Laterality: Bilateral;  . Breast reconstruction with placement of tissue expander and flex hd (acellular hydrated dermis) Bilateral 07/28/2014    Procedure: BILATERAL BREAST RECONSTRUCTION WITH PLACEMENT OF TISSUE EXPANDER AND FLEX HD (ACELLULAR HYDRATED DERMIS);  Surgeon: Theodoro Kos, DO;  Location: [place];  Service: Plastics;  Laterality: Bilateral;  . Removal of tissue expander and placement of implant Bilateral 10/14/2014    Procedure: REMOVAL OF BILATERAL BREAST  TISSUE EXPANDER  WITH PLASCEMENT OF BILATERAL  BREAST IMPLANTS;  Surgeon: Theodoro Kos, DO;  Location: [place];  Service: Plastics;  Laterality: Bilateral;  . Left heart catheterization with coronary angiogram N/A 09/13/2014    Procedure: LEFT HEART CATHETERIZATION WITH CORONARY ANGIOGRAM;  Surgeon: Jolaine Artist, MD;  Location[facility] CATH LAB;  Service: Cardiovascular;  Laterality: N/A;  .  Cardiac catheterization      10'15- Dr. Sung Amabile  . Robotic assisted total hysterectomy with bilateral salpingo oopherectomy Bilateral 02/08/2015    Procedure: ROBOTIC ASSISTED TOTAL HYSTERECTOMY WITH BILATERAL SALPINGO OOPHORECTOMY; UTERUS WEIGHING GREATER THAN 250 GRAMS;  Surgeon: Everitt Amber, MD;  Location: [facility] ORS;  Service: Gynecology;  Laterality: Bilateral;  BRCA 1 GENE MUTATION   Family History   Problem Relation Age of Onset  . Breast cancer Mother 6    currently 75  . Diabetes Father   . Pancreatic cancer Father 75  . Breast cancer Paternal Aunt 30    currently 22; BRCA1 positive  . Stroke Maternal Grandfather   . Cancer Paternal Aunt     unk. primary; deceased 57s  . Breast cancer Cousin     daughter of unaffected paternal aunt; dx in her 45s   History  Substance Use Topics  . Smoking status: Former Smoker -- 0.25 packs/day for 15 years    Quit date: 02/18/2009  . Smokeless tobacco: Former Systems developer  . Alcohol Use: No     Comment: occasional   OB History    Gravida Para Term Preterm AB TAB SAB Ectopic Multiple Living   5 5 5       5      Review of Systems  Constitutional: Negative for fever and fatigue.  HENT: Negative for congestion and drooling.   Eyes: Negative for pain.  Respiratory: Negative for cough and shortness of breath.   Cardiovascular: Negative for chest pain.  Gastrointestinal: Negative for nausea, vomiting, abdominal pain and diarrhea.  Genitourinary: Negative for dysuria and hematuria.  Musculoskeletal: Negative for back pain, gait problem and neck pain.  Skin: Negative for color change.  Neurological: Negative for dizziness and headaches.  Hematological: Negative for adenopathy.  Psychiatric/Behavioral: Negative for behavioral problems.  All other systems reviewed and are negative.     Allergies  Compazine  Home Medications   Prior to Admission medications   Medication Sig Start Date End Date Taking? Authorizing Provider  carvedilol (COREG) 12.5 MG tablet Take 1.5 tablets (18.75 mg total) by mouth 2 (two) times daily with a meal. 03/01/15  Yes Jolaine Artist, MD  cyclobenzaprine (FLEXERIL) 10 MG tablet Take 1 tablet (10 mg total) by mouth 3 (three) times daily as needed for muscle spasms. 07/31/14  Yes Shawn M Rayburn, PA-C  DULoxetine (CYMBALTA) 30 MG capsule Take 1 capsule (30 mg total) by mouth daily. 12/16/14  Yes Olugbemiga Essie Christine,  MD  enoxaparin (LOVENOX) 40 MG/0.4ML injection Inject 0.4 mLs (40 mg total) into the skin daily. Patient taking differently: Inject 40 mg into the skin daily. 1500 02/09/15  Yes Melissa D Cross, NP  ferrous sulfate 325 (65 FE) MG tablet Take 325 mg by mouth 2 (two) times daily with a meal.    Yes Historical Provider, MD  hydrALAZINE (APRESOLINE) 50 MG tablet Take 1 tablet (50 mg total) by mouth every 8 (eight) hours. 03/01/15  Yes Jolaine Artist, MD  hydrochlorothiazide (MICROZIDE) 12.5 MG capsule Take 1 capsule (12.5 mg total) by mouth daily. 03/01/15  Yes Jolaine Artist, MD  ibuprofen (ADVIL,MOTRIN) 800 MG tablet Take 800 mg by mouth every 8 (eight) hours as needed (pain).   Yes Historical Provider, MD  lidocaine-prilocaine (EMLA) cream Apply 1 application topically as needed (For port-a-cath.). 09/07/14  Yes [name], MD  lisinopril (PRINIVIL,ZESTRIL) 40 MG tablet Take 1 tablet (40 mg total) by mouth daily. 03/01/15  Yes Jolaine Artist, MD  LORazepam (ATIVAN) 0.5 MG tablet Take 1 tablet (0.5 mg total) by mouth every 6 (six) hours as needed (Nausea or vomiting). 07/05/14  Yes Lennis Marion Downer, MD  oxyCODONE (OXY IR/ROXICODONE) 5 MG immediate release tablet Take 1-2 tablets (5-10 mg total) by mouth every 4 (four) hours as needed for severe pain. 02/09/15  Yes Melissa D Cross, NP  phenazopyridine (PYRIDIUM) 200 MG tablet Take 1 tablet (200 mg total) by mouth 3 (three) times daily after meals. 02/23/15  Yes Everitt Amber, MD  acetaminophen (TYLENOL) 325 MG tablet Take 2 tablets (650 mg total) by mouth every 6 (six) hours as needed for mild pain (or Temp > 100). Patient not taking: Reported on 03/03/2015 07/30/14   Shawn M Rayburn, PA-C  diazepam (VALIUM) 2 MG tablet Take 1 tablet (2 mg total) by mouth every 6 (six) hours as needed for anxiety or muscle spasms. 09/10/14   Laurie Panda, NP  ipratropium (ATROVENT) 0.03 % nasal spray Place 2 sprays into the nose 2 (two) times daily. PRN  congestion Patient not taking: Reported on 03/03/2015 09/01/14   Jarrett Soho Muthersbaugh, PA-C  ondansetron (ZOFRAN) 8 MG tablet Take 8 mg by mouth 2 (two) times daily as needed for nausea or vomiting.     Historical Provider, MD  PRESCRIPTION MEDICATION Chemo at [facility]    Historical Provider, MD   BP 109/60 mmHg  Pulse 62  Temp(Src) 98 F (36.7 C) (Oral)  Resp 18  Ht 5' 5"  (1.651 m)  Wt 206 lb (93.441 kg)  BMI 34.28 kg/m2  SpO2 97%  LMP 02/19/2014 Physical Exam  Constitutional: She is oriented to person, place, and time. She appears well-developed and well-nourished.  HENT:  Head: Normocephalic.  Mouth/Throat: Oropharynx is clear and moist. No oropharyngeal exudate.  Eyes: Conjunctivae and EOM are normal. Pupils are equal, round, and reactive to light.  Neck: Normal range of motion. Neck supple.  Cardiovascular: Normal rate, regular rhythm, normal heart sounds and intact distal pulses.  Exam reveals no gallop and no friction rub.   No murmur heard. Pulmonary/Chest: Effort normal and breath sounds normal. No respiratory distress. She has no wheezes.  Abdominal: Soft. Bowel sounds are normal. There is no tenderness. There is no rebound and no guarding.  Musculoskeletal: Normal range of motion. She exhibits no edema or tenderness.  Neurological: She is alert and oriented to person, place, and time.  Skin: Skin is warm and dry.  Psychiatric: She has a normal mood and affect. Her behavior is normal.  Nursing note and vitals reviewed.   ED Course  Procedures (including critical care time) Labs Review Labs Reviewed  CBC WITH DIFFERENTIAL/PLATELET - Abnormal; Notable for the following:    Hemoglobin 10.4 (*)    HCT 34.6 (*)    MCH 25.6 (*)    RDW 16.0 (*)    Platelets 491 (*)    Eosinophils Relative 10 (*)    Eosinophils Absolute 0.9 (*)    All other components within normal limits  COMPREHENSIVE METABOLIC PANEL - Abnormal; Notable for the following:    Glucose, Bld 100 (*)    GFR  calc non Af Amer 67 (*)    GFR calc Af Amer 78 (*)    All other components within normal limits  TROPONIN I    Imaging Review Dg Chest 2 View  03/04/2015   CLINICAL DATA:  Chest pain.  EXAM: CHEST  2 VIEW  COMPARISON:  Chest radiograph 12/08/2014  FINDINGS: Lower lung volumes from prior. Mild  elevation of right hemidiaphragm. Mild cardiomegaly, unchanged. Pulmonary vasculature is normal. No consolidation, pleural effusion, or pneumothorax. No acute osseous abnormalities are seen. Surgical clips noted in both axilla.  IMPRESSION: 1. Stable mild cardiomegaly. 2. Lower lung volumes, no acute intrathoracic process.   Electronically Signed   By: Jeb Levering M.D.   On: 03/04/2015 00:15     EKG Interpretation   Date/Time:  Thursday March 03 2015 20:47:05 EDT Ventricular Rate:  59 PR Interval:  151 QRS Duration: 98 QT Interval:  436 QTC Calculation: 432 R Axis:   -23 Text Interpretation:  Sinus rhythm Borderline left axis deviation Abnormal  R-wave progression, late transition Borderline T wave abnormalities ED  PHYSICIAN INTERPRETATION AVAILABLE IN [facility] Confirmed by TEST,  Record ([id number]) on 03/05/2015 8:14:19 AM      MDM   Final diagnoses:  Other chest pain    11:33 PM 44 y.o. female with a history of breast cancer status post mastectomy, DVT on Lovenox who presents with sharp left-sided chest pain which began around 8 PM this evening and lasted approximately 30 minutes to 1 hour. She states that she has been having similar chest pains about once a week for the last year. She has had significant prior workup including a noncontributory cardiac cath and multiple CT angios of her chest. The last CT angio was in January of this year. She denies shortness of breath on exam. She is currently asymptomatic. Unknown cause of her symptoms but they sound to be chronic and similar to many previous episodes. We'll get screening labs and imaging. Do not think the patient would benefit from  repeat CT angio as her symptoms are consistent with multiple prior episodes w/out new features. She does note that she has had a hysterectomy in the last month and also has a history of DVT. Her vital signs are unremarkable here and she appears comfortable on exam.  1:26 AM: I interpreted/reviewed the labs and/or imaging which were non-contributory.   I have discussed the diagnosis/risks/treatment options with the patient and believe the pt to be eligible for discharge home to follow-up with her pcp. We also discussed returning to the ED immediately if new or worsening sx occur. We discussed the sx which are most concerning (e.g., worsening pain, sob, fever) that necessitate immediate return. Medications administered to the patient during their visit and any new prescriptions provided to the patient are listed below.  Medications given during this visit Medications - No data to display  New Prescriptions   OXYCODONE-ACETAMINOPHEN (PERCOCET) 5-325 MG PER TABLET    Take 1 tablet by mouth every 6 (six) hours as needed.     Pamella Pert, MD 03/05/15 1118  Pamella Pert, MD 03/05/15 1122

## 2015-03-03 NOTE — ED Notes (Signed)
Bed: FX90 Expected date:  Expected time:  Means of arrival:  Comments: EMS 44 yo female left side chest pain

## 2015-03-03 NOTE — Telephone Encounter (Signed)
per pof to CX appts and leave MD appt-per GM pt aware

## 2015-03-03 NOTE — Progress Notes (Unsigned)
Received a note from cardiology that Tamara [name] echo shows again a drop in her ejection fraction. We are holding her treatments until this resolves.

## 2015-03-03 NOTE — ED Notes (Addendum)
Pt is an oncology c/o diffuse chest pain, this is ongoing pain that she has seen a cardiologist for it, had an ecocardiogram done and was taken of her chemo medications until they figure out underlying cause of pain. Pain worsened today around 1900 hrs rating 9/10.

## 2015-03-03 NOTE — Telephone Encounter (Signed)
Patient called to say that Dr. Haroldine Laws told her she should discontinue Herceptin due to hear cardiac condition.  She wants to know what she will receive next.  She would like to be seen before Apr 07, 2015, "ASAP" because this is her life and she needs to know what she is going to receive next.  Please advise.

## 2015-03-04 LAB — COMPREHENSIVE METABOLIC PANEL
ALBUMIN: 4 g/dL (ref 3.5–5.2)
ALT: 16 U/L (ref 0–35)
ANION GAP: 9 (ref 5–15)
AST: 18 U/L (ref 0–37)
Alkaline Phosphatase: 92 U/L (ref 39–117)
BUN: 21 mg/dL (ref 6–23)
CO2: 27 mmol/L (ref 19–32)
CREATININE: 1.02 mg/dL (ref 0.50–1.10)
Calcium: 9.6 mg/dL (ref 8.4–10.5)
Chloride: 106 mmol/L (ref 96–112)
GFR calc Af Amer: 78 mL/min — ABNORMAL LOW (ref >90)
GFR, EST NON AFRICAN AMERICAN: 67 mL/min — AB (ref >90)
Glucose, Bld: 100 mg/dL — ABNORMAL HIGH (ref 70–99)
Potassium: 3.8 mmol/L (ref 3.5–5.1)
Sodium: 142 mmol/L (ref 135–145)
Total Bilirubin: 0.3 mg/dL (ref 0.3–1.2)
Total Protein: 7.2 g/dL (ref 6.0–8.3)

## 2015-03-04 LAB — TROPONIN I

## 2015-03-04 MED ORDER — OXYCODONE-ACETAMINOPHEN 5-325 MG PO TABS: 1.0000 | ORAL_TABLET | Freq: Four times a day (QID) | ORAL | Status: DC | PRN

## 2015-03-04 NOTE — Discharge Instructions (Signed)

## 2015-03-07 ENCOUNTER — Encounter: Payer: Self-pay | Admitting: Gynecologic Oncology

## 2015-03-07 ENCOUNTER — Ambulatory Visit: Payer: Medicaid Other | Attending: Gynecologic Oncology | Admitting: Gynecologic Oncology

## 2015-03-07 ENCOUNTER — Encounter: Payer: Self-pay | Admitting: *Deleted

## 2015-03-07 VITALS — BP 106/63 | HR 72 | Temp 97.7°F | Resp 18 | Ht 65.0 in | Wt 202.6 lb

## 2015-03-07 DIAGNOSIS — Z1501 Genetic susceptibility to malignant neoplasm of breast: Secondary | ICD-10-CM

## 2015-03-07 DIAGNOSIS — Z09 Encounter for follow-up examination after completed treatment for conditions other than malignant neoplasm: Secondary | ICD-10-CM | POA: Diagnosis not present

## 2015-03-07 DIAGNOSIS — Z1509 Genetic susceptibility to other malignant neoplasm: Secondary | ICD-10-CM

## 2015-03-07 DIAGNOSIS — Z86718 Personal history of other venous thrombosis and embolism: Secondary | ICD-10-CM | POA: Diagnosis not present

## 2015-03-07 DIAGNOSIS — C50412 Malignant neoplasm of upper-outer quadrant of left female breast: Secondary | ICD-10-CM

## 2015-03-07 DIAGNOSIS — Z90710 Acquired absence of both cervix and uterus: Secondary | ICD-10-CM | POA: Insufficient documentation

## 2015-03-07 DIAGNOSIS — Z7901 Long term (current) use of anticoagulants: Secondary | ICD-10-CM

## 2015-03-07 DIAGNOSIS — D259 Leiomyoma of uterus, unspecified: Secondary | ICD-10-CM

## 2015-03-07 NOTE — Progress Notes (Signed)
POSTOPERATIVE EVALUATION  HPI:  Tamara [name] is a 44 y.o. year old [id number] initially seen in consultation on 01/24/15 for BRCA 1 deleterious mutation.  She then underwent a robotic hysterectomy, BSO on 0/8/14 without complications.  Her postoperative course was uncomplicated in the hospital and she was discharged on POD1.  Her final pathology revealed benign pathology in the uterus, tubes and ovaries. Washings were negative. She had uterine fibroids.  She had severe pelvic pressure 2 weeks postop and was seen and evaluated. No discrete findings were noted. The patient underwent a CT scan which showed no concerning findings including no collections.  She developed chest pain postop and was evaluated by her Cardiologist, Dr Haroldine Laws, who diagnosed her with a reduced ejection fraction. She had her Herceptin discontinued. Of note, she is on lovenox (prophylactic dose) for her hx of prior recent dvt associated with a vascular port. The plan is to discontinue this dosing 30 days after her surgery (03/11/15).  Interval Hx:  She feels "much better". Her pelvic pressure has completely resolved. She has mild vaginal spotting. She denies having initiated intercourse postop.   Review of systems: Constitutional:  She has no weight gain or weight loss. She has no fever or chills. Eyes: No blurred vision Ears, Nose, Mouth, Throat: No dizziness, headaches or changes in hearing. No mouth sores. Cardiovascular:see HPI Respiratory:  No shortness of breath, wheezing or cough Gastrointestinal: She has normal bowel movements without diarrhea or constipation. She denies any nausea or vomiting. She denies blood in her stool or heart burn. Genitourinary:  She denies pelvic pain, + pelvic pressure no changes in her urinary function. She has no hematuria, dysuria, or incontinence. She has no irregular vaginal bleeding or vaginal discharge Musculoskeletal: Denies muscle weakness or joint pains.  Skin:  She has no skin  changes, rashes or itching Neurological:  Denies dizziness or headaches. No neuropathy, no numbness or tingling. Psychiatric:  She denies depression or anxiety. Hematologic/Lymphatic:   No easy bruising or bleeding   Physical Exam: Blood pressure 106/63, pulse 72, temperature 97.7 F (36.5 C), temperature source Oral, resp. rate 18, height 5' 5"  (1.651 m), weight 202 lb 9.6 oz (91.899 kg), last menstrual period 02/19/2014. General: Well dressed, well nourished in no apparent distress.   HEENT:  Normocephalic and atraumatic, no lesions.  Extraocular muscles intact. Sclerae anicteric. Pupils equal, round, reactive. No mouth sores or ulcers. Thyroid is normal size, not nodular, midline. Skin:  No lesions or rashes. Breasts:  deferred Lungs:  CTAB Cardiovascular:  No murmur appreciated Abdomen:  Soft, nontender, nondistended.  No palpable masses.  No hepatosplenomegaly.  No ascites. Normal bowel sounds.  No hernias.  Incisions are well healed. Genitourinary: Normal EGBUS  Vaginal cuff intact.  No bleeding or discharge.  No obvious cul de sac fullness or drainage.  Cuff in tact. Extremities: No cyanosis, clubbing or edema.  No calf tenderness or erythema. No palpable cords. Psychiatric: Mood and affect are appropriate. Neurological: Awake, alert and oriented x 3. Sensation is intact, no neuropathy.  Musculoskeletal: No pain, normal strength and range of motion.  CBC    Component Value Date/Time   WBC 9.2 03/03/2015 2308   WBC 9.0 02/22/2015 1124   RBC 4.06 03/03/2015 2308   RBC 3.86 02/22/2015 1124   RBC 2.88* 05/20/2014 1909   HGB 10.4* 03/03/2015 2308   HGB 10.0* 02/22/2015 1124   HCT 34.6* 03/03/2015 2308   HCT 32.9* 02/22/2015 1124   PLT 491* 03/03/2015 2308   PLT  447* 02/22/2015 1124   MCV 85.2 03/03/2015 2308   MCV 85.2 02/22/2015 1124   MCH 25.6* 03/03/2015 2308   MCH 25.9 02/22/2015 1124   MCHC 30.1 03/03/2015 2308   MCHC 30.4* 02/22/2015 1124   RDW 16.0* 03/03/2015 2308    RDW 14.9* 02/22/2015 1124   LYMPHSABS 2.7 03/03/2015 2308   LYMPHSABS 2.2 02/22/2015 1124   MONOABS 0.6 03/03/2015 2308   MONOABS 0.5 02/22/2015 1124   EOSABS 0.9* 03/03/2015 2308   EOSABS 0.7* 02/22/2015 1124   BASOSABS 0.0 03/03/2015 2308   BASOSABS 0.0 02/22/2015 1124     Assessment:    44 y.o. year old with pelvic pressure  S/p robotic hysterectomy and BSO for BRCA1 deleterious mutation on 02/08/15. No focal abnormalities on exam.  Plan: Followup on a prn basis only. Discontinue lovenox 30 days postop.   Donaciano Eva, MD

## 2015-03-07 NOTE — Patient Instructions (Signed)
Followup with gyn oncology if needed in the future. Please call us with any additional questions or concerns you have. Followup with your gynecologist annually in the future for well woman checks.   You can discontinue Lovenox injections on Wednesday, 03/09/15.

## 2015-03-15 ENCOUNTER — Ambulatory Visit: Payer: Medicaid Other

## 2015-03-15 ENCOUNTER — Other Ambulatory Visit: Payer: Medicaid Other

## 2015-03-28 ENCOUNTER — Other Ambulatory Visit: Payer: Self-pay | Admitting: Plastic Surgery

## 2015-03-28 DIAGNOSIS — N651 Disproportion of reconstructed breast: Secondary | ICD-10-CM

## 2015-03-30 ENCOUNTER — Encounter (HOSPITAL_BASED_OUTPATIENT_CLINIC_OR_DEPARTMENT_OTHER): Payer: Self-pay | Admitting: *Deleted

## 2015-03-30 NOTE — Progress Notes (Signed)
Pt was here for past reconstruction-doing well-labs done

## 2015-03-31 ENCOUNTER — Encounter (HOSPITAL_BASED_OUTPATIENT_CLINIC_OR_DEPARTMENT_OTHER): Payer: Self-pay

## 2015-03-31 ENCOUNTER — Ambulatory Visit (HOSPITAL_BASED_OUTPATIENT_CLINIC_OR_DEPARTMENT_OTHER): Payer: Medicaid Other | Admitting: Anesthesiology

## 2015-03-31 ENCOUNTER — Encounter (HOSPITAL_BASED_OUTPATIENT_CLINIC_OR_DEPARTMENT_OTHER)
Admission: RE | Disposition: A | Discharge status: Home / Self Care | Payer: Self-pay | Source: Ambulatory Visit | Attending: Plastic Surgery

## 2015-03-31 ENCOUNTER — Ambulatory Visit: Payer: Medicaid Other | Admitting: Internal Medicine

## 2015-03-31 ENCOUNTER — Ambulatory Visit (HOSPITAL_BASED_OUTPATIENT_CLINIC_OR_DEPARTMENT_OTHER)
Admission: RE | Admit: 2015-03-31 | Discharge: 2015-03-31 | Disposition: A | Discharge status: Home / Self Care | Payer: Medicaid Other | Source: Ambulatory Visit | Attending: Plastic Surgery | Admitting: Plastic Surgery

## 2015-03-31 DIAGNOSIS — D649 Anemia, unspecified: Secondary | ICD-10-CM | POA: Insufficient documentation

## 2015-03-31 DIAGNOSIS — Z86718 Personal history of other venous thrombosis and embolism: Secondary | ICD-10-CM | POA: Insufficient documentation

## 2015-03-31 DIAGNOSIS — Z90710 Acquired absence of both cervix and uterus: Secondary | ICD-10-CM | POA: Diagnosis not present

## 2015-03-31 DIAGNOSIS — Z888 Allergy status to other drugs, medicaments and biological substances status: Secondary | ICD-10-CM | POA: Diagnosis not present

## 2015-03-31 DIAGNOSIS — Z9013 Acquired absence of bilateral breasts and nipples: Secondary | ICD-10-CM | POA: Diagnosis not present

## 2015-03-31 DIAGNOSIS — Z87891 Personal history of nicotine dependence: Secondary | ICD-10-CM | POA: Diagnosis not present

## 2015-03-31 DIAGNOSIS — Z9851 Tubal ligation status: Secondary | ICD-10-CM | POA: Diagnosis not present

## 2015-03-31 DIAGNOSIS — Z853 Personal history of malignant neoplasm of breast: Secondary | ICD-10-CM | POA: Insufficient documentation

## 2015-03-31 DIAGNOSIS — I10 Essential (primary) hypertension: Secondary | ICD-10-CM | POA: Insufficient documentation

## 2015-03-31 DIAGNOSIS — Z800 Family history of malignant neoplasm of digestive organs: Secondary | ICD-10-CM | POA: Insufficient documentation

## 2015-03-31 DIAGNOSIS — N651 Disproportion of reconstructed breast: Secondary | ICD-10-CM | POA: Diagnosis present

## 2015-03-31 DIAGNOSIS — D509 Iron deficiency anemia, unspecified: Secondary | ICD-10-CM | POA: Insufficient documentation

## 2015-03-31 DIAGNOSIS — Z9861 Coronary angioplasty status: Secondary | ICD-10-CM | POA: Insufficient documentation

## 2015-03-31 HISTORY — PX: LIPOSUCTION WITH LIPOFILLING: SHX6436

## 2015-03-31 HISTORY — PX: BREAST RECONSTRUCTION: SHX9

## 2015-03-31 HISTORY — DX: Other specified postprocedural states: Z98.890

## 2015-03-31 HISTORY — DX: Nausea with vomiting, unspecified: R11.2

## 2015-03-31 LAB — POCT HEMOGLOBIN-HEMACUE: HEMOGLOBIN: 10.6 g/dL — AB (ref 12.0–15.0)

## 2015-03-31 SURGERY — RECONSTRUCTION, BREAST
Anesthesia: General | Site: Breast | Laterality: Bilateral

## 2015-03-31 MED ORDER — MIDAZOLAM HCL 2 MG/2ML IJ SOLN
INTRAMUSCULAR | Status: AC
  Filled 2015-03-31: qty 2

## 2015-03-31 MED ORDER — LACTATED RINGERS IV SOLN
INTRAVENOUS | Status: DC
  Administered 2015-03-31 (×3): via INTRAVENOUS

## 2015-03-31 MED ORDER — HYDROMORPHONE HCL 1 MG/ML IJ SOLN
0.2500 mg | INTRAMUSCULAR | Status: DC | PRN
  Administered 2015-03-31 (×3): 0.5 mg via INTRAVENOUS

## 2015-03-31 MED ORDER — FENTANYL CITRATE (PF) 100 MCG/2ML IJ SOLN: 50.0000 ug | INTRAMUSCULAR | Status: DC | PRN

## 2015-03-31 MED ORDER — DEXTROSE 5 % IV SOLN
10.0000 mg | INTRAVENOUS | Status: DC | PRN
  Administered 2015-03-31: 50 ug/min via INTRAVENOUS

## 2015-03-31 MED ORDER — DEXAMETHASONE SODIUM PHOSPHATE 4 MG/ML IJ SOLN
INTRAMUSCULAR | Status: DC | PRN
Start: 2015-03-31 — End: 2015-03-31
  Administered 2015-03-31: 10 mg via INTRAVENOUS

## 2015-03-31 MED ORDER — HYDROMORPHONE HCL 1 MG/ML IJ SOLN
INTRAMUSCULAR | Status: AC
  Filled 2015-03-31: qty 1

## 2015-03-31 MED ORDER — MIDAZOLAM HCL 2 MG/2ML IJ SOLN
1.0000 mg | INTRAMUSCULAR | Status: DC | PRN
  Administered 2015-03-31: 2 mg via INTRAVENOUS

## 2015-03-31 MED ORDER — LIDOCAINE HCL (CARDIAC) 20 MG/ML IV SOLN
INTRAVENOUS | Status: DC | PRN
  Administered 2015-03-31: 50 mg via INTRAVENOUS

## 2015-03-31 MED ORDER — SUCCINYLCHOLINE CHLORIDE 20 MG/ML IJ SOLN
INTRAMUSCULAR | Status: DC | PRN
Start: 2015-03-31 — End: 2015-03-31
  Administered 2015-03-31: 120 mg via INTRAVENOUS

## 2015-03-31 MED ORDER — ONDANSETRON HCL 4 MG/2ML IJ SOLN
INTRAMUSCULAR | Status: DC | PRN
  Administered 2015-03-31: 4 mg via INTRAVENOUS

## 2015-03-31 MED ORDER — MEPERIDINE HCL 25 MG/ML IJ SOLN: 6.2500 mg | INTRAMUSCULAR | Status: DC | PRN

## 2015-03-31 MED ORDER — GLYCOPYRROLATE 0.2 MG/ML IJ SOLN
0.2000 mg | Freq: Once | INTRAMUSCULAR | Status: AC | PRN
  Administered 2015-03-31: 0.2 mg via INTRAVENOUS

## 2015-03-31 MED ORDER — SODIUM BICARBONATE 4 % IV SOLN
INTRAVENOUS | Status: DC | PRN
  Administered 2015-03-31: 900 mL via INTRAMUSCULAR

## 2015-03-31 MED ORDER — OXYCODONE HCL 5 MG PO TABS
5.0000 mg | ORAL_TABLET | Freq: Once | ORAL | Status: AC
  Administered 2015-03-31: 5 mg via ORAL

## 2015-03-31 MED ORDER — SCOPOLAMINE 1 MG/3DAYS TD PT72
1.0000 | MEDICATED_PATCH | TRANSDERMAL | Status: DC
  Administered 2015-03-31: 1 via TRANSDERMAL
  Administered 2015-03-31: 1.5 mg via TRANSDERMAL

## 2015-03-31 MED ORDER — SUFENTANIL CITRATE 50 MCG/ML IV SOLN
INTRAVENOUS | Status: AC
  Filled 2015-03-31: qty 1

## 2015-03-31 MED ORDER — OXYCODONE HCL 5 MG PO TABS
ORAL_TABLET | ORAL | Status: AC
  Filled 2015-03-31: qty 1

## 2015-03-31 MED ORDER — CEFAZOLIN SODIUM-DEXTROSE 2-3 GM-% IV SOLR
2.0000 g | INTRAVENOUS | Status: AC
  Administered 2015-03-31: 2 g via INTRAVENOUS

## 2015-03-31 MED ORDER — PHENYLEPHRINE HCL 10 MG/ML IJ SOLN
INTRAMUSCULAR | Status: DC | PRN
  Administered 2015-03-31: 40 ug via INTRAVENOUS

## 2015-03-31 MED ORDER — PROPOFOL 10 MG/ML IV BOLUS
INTRAVENOUS | Status: DC | PRN
  Administered 2015-03-31: 30 mg via INTRAVENOUS
  Administered 2015-03-31: 200 mg via INTRAVENOUS

## 2015-03-31 MED ORDER — CEFAZOLIN SODIUM-DEXTROSE 2-3 GM-% IV SOLR
INTRAVENOUS | Status: AC
  Filled 2015-03-31: qty 50

## 2015-03-31 MED ORDER — SCOPOLAMINE 1 MG/3DAYS TD PT72
MEDICATED_PATCH | TRANSDERMAL | Status: AC
  Filled 2015-03-31: qty 1

## 2015-03-31 MED ORDER — EPHEDRINE SULFATE 50 MG/ML IJ SOLN
INTRAMUSCULAR | Status: DC | PRN
  Administered 2015-03-31: 10 mg via INTRAVENOUS

## 2015-03-31 MED ORDER — OXYCODONE HCL 5 MG PO TABS: 5.0000 mg | ORAL_TABLET | ORAL | Status: DC | PRN

## 2015-03-31 MED ORDER — SODIUM CHLORIDE 0.9 % IR SOLN
Status: DC | PRN
  Administered 2015-03-31: 500 mL

## 2015-03-31 MED ORDER — SUFENTANIL CITRATE 50 MCG/ML IV SOLN
INTRAVENOUS | Status: DC | PRN
  Administered 2015-03-31: 10 ug via INTRAVENOUS
  Administered 2015-03-31: 5 ug via INTRAVENOUS

## 2015-03-31 MED ORDER — BUPIVACAINE-EPINEPHRINE 0.25% -1:200000 IJ SOLN
INTRAMUSCULAR | Status: DC | PRN
Start: 2015-03-31 — End: 2015-03-31
  Administered 2015-03-31: 7 mL

## 2015-03-31 SURGICAL SUPPLY — 73 items
BAG DECANTER FOR FLEXI CONT (MISCELLANEOUS) ×3 IMPLANT
BINDER BREAST LRG (GAUZE/BANDAGES/DRESSINGS) IMPLANT
BINDER BREAST MEDIUM (GAUZE/BANDAGES/DRESSINGS) IMPLANT
BINDER BREAST XLRG (GAUZE/BANDAGES/DRESSINGS) IMPLANT
BINDER BREAST XXLRG (GAUZE/BANDAGES/DRESSINGS) IMPLANT
BIOPATCH RED 1 DISK 7.0 (GAUZE/BANDAGES/DRESSINGS) IMPLANT
BIOPATCH RED 1IN DISK 7.0MM (GAUZE/BANDAGES/DRESSINGS)
BLADE HEX COATED 2.75 (ELECTRODE) ×3 IMPLANT
BLADE SURG 15 STRL LF DISP TIS (BLADE) ×1 IMPLANT
BLADE SURG 15 STRL SS (BLADE) ×2
BNDG GAUZE ELAST 4 BULKY (GAUZE/BANDAGES/DRESSINGS) ×6 IMPLANT
CANISTER LIPO FAT HARVEST (MISCELLANEOUS) ×3 IMPLANT
CANISTER SUCT 1200ML W/VALVE (MISCELLANEOUS) ×3 IMPLANT
CHLORAPREP W/TINT 26ML (MISCELLANEOUS) ×3 IMPLANT
COVER BACK TABLE 60X90IN (DRAPES) ×3 IMPLANT
COVER MAYO STAND STRL (DRAPES) ×3 IMPLANT
DECANTER SPIKE VIAL GLASS SM (MISCELLANEOUS) IMPLANT
DRAIN CHANNEL 19F RND (DRAIN) IMPLANT
DRAPE LAPAROSCOPIC ABDOMINAL (DRAPES) ×3 IMPLANT
DRSG PAD ABDOMINAL 8X10 ST (GAUZE/BANDAGES/DRESSINGS) ×6 IMPLANT
ELECT BLADE 4.0 EZ CLEAN MEGAD (MISCELLANEOUS) ×3
ELECT BLADE 6.5 .24CM SHAFT (ELECTRODE) IMPLANT
ELECT REM PT RETURN 9FT ADLT (ELECTROSURGICAL) ×3
ELECTRODE BLDE 4.0 EZ CLN MEGD (MISCELLANEOUS) ×1 IMPLANT
ELECTRODE REM PT RTRN 9FT ADLT (ELECTROSURGICAL) ×1 IMPLANT
EVACUATOR SILICONE 100CC (DRAIN) IMPLANT
FILTER LIPOSUCTION (MISCELLANEOUS) ×3 IMPLANT
GLOVE BIO SURGEON STRL SZ 6.5 (GLOVE) ×10 IMPLANT
GLOVE BIO SURGEONS STRL SZ 6.5 (GLOVE) ×5
GLOVE BIOGEL PI IND STRL 7.0 (GLOVE) ×1 IMPLANT
GLOVE BIOGEL PI INDICATOR 7.0 (GLOVE) ×2
GLOVE SURG SS PI 7.0 STRL IVOR (GLOVE) ×3 IMPLANT
GOWN STRL REUS W/ TWL LRG LVL3 (GOWN DISPOSABLE) ×3 IMPLANT
GOWN STRL REUS W/TWL LRG LVL3 (GOWN DISPOSABLE) ×6
IV NS 500ML (IV SOLUTION)
IV NS 500ML BAXH (IV SOLUTION) IMPLANT
KIT FILL SYSTEM UNIVERSAL (SET/KITS/TRAYS/PACK) IMPLANT
LINER CANISTER 1000CC FLEX (MISCELLANEOUS) ×3 IMPLANT
LIQUID BAND (GAUZE/BANDAGES/DRESSINGS) ×6 IMPLANT
NDL SAFETY ECLIPSE 18X1.5 (NEEDLE) ×1 IMPLANT
NEEDLE HYPO 18GX1.5 SHARP (NEEDLE) ×2
NEEDLE HYPO 25X1 1.5 SAFETY (NEEDLE) ×3 IMPLANT
NS IRRIG 1000ML POUR BTL (IV SOLUTION) IMPLANT
PACK BASIN DAY SURGERY FS (CUSTOM PROCEDURE TRAY) ×3 IMPLANT
PAD ALCOHOL SWAB (MISCELLANEOUS) IMPLANT
PENCIL BUTTON HOLSTER BLD 10FT (ELECTRODE) ×3 IMPLANT
PIN SAFETY STERILE (MISCELLANEOUS) IMPLANT
SLEEVE SCD COMPRESS KNEE MED (MISCELLANEOUS) ×3 IMPLANT
SPONGE GAUZE 4X4 12PLY STER LF (GAUZE/BANDAGES/DRESSINGS) IMPLANT
SPONGE LAP 18X18 X RAY DECT (DISPOSABLE) ×9 IMPLANT
SUT MNCRL AB 4-0 PS2 18 (SUTURE) IMPLANT
SUT MON AB 3-0 SH 27 (SUTURE) ×2
SUT MON AB 3-0 SH27 (SUTURE) ×1 IMPLANT
SUT MON AB 5-0 PS2 18 (SUTURE) ×6 IMPLANT
SUT PDS 3-0 CT2 (SUTURE)
SUT PDS AB 2-0 CT2 27 (SUTURE) IMPLANT
SUT PDS II 3-0 CT2 27 ABS (SUTURE) IMPLANT
SUT SILK 3 0 PS 1 (SUTURE) IMPLANT
SUT VIC AB 3-0 SH 27 (SUTURE)
SUT VIC AB 3-0 SH 27X BRD (SUTURE) IMPLANT
SUT VICRYL 4-0 PS2 18IN ABS (SUTURE) IMPLANT
SYR 20CC LL (SYRINGE) IMPLANT
SYR 50ML LL SCALE MARK (SYRINGE) ×12 IMPLANT
SYR BULB IRRIGATION 50ML (SYRINGE) ×3 IMPLANT
SYR CONTROL 10ML LL (SYRINGE) ×3 IMPLANT
SYR TB 1ML LL NO SAFETY (SYRINGE) ×3 IMPLANT
TOWEL OR 17X24 6PK STRL BLUE (TOWEL DISPOSABLE) ×6 IMPLANT
TUBE CONNECTING 20'X1/4 (TUBING) ×1
TUBE CONNECTING 20X1/4 (TUBING) ×2 IMPLANT
TUBING INFILTRATION IT-10001 (TUBING) ×3 IMPLANT
TUBING SET GRADUATE ASPIR 12FT (MISCELLANEOUS) ×3 IMPLANT
UNDERPAD 30X30 (UNDERPADS AND DIAPERS) ×6 IMPLANT
YANKAUER SUCT BULB TIP NO VENT (SUCTIONS) ×3 IMPLANT

## 2015-03-31 NOTE — Anesthesia Procedure Notes (Signed)
Procedure Name: Intubation Date/Time: 03/31/2015 12:19 PM Performed by: Melynda Ripple D Pre-anesthesia Checklist: Patient identified, Emergency Drugs available, Suction available and Patient being monitored Patient Re-evaluated:Patient Re-evaluated prior to inductionOxygen Delivery Method: Circle System Utilized Preoxygenation: Pre-oxygenation with 100% oxygen Intubation Type: IV induction Ventilation: Mask ventilation without difficulty Laryngoscope Size: Mac and 3 Grade View: Grade I Tube type: Oral Number of attempts: 1 Airway Equipment and Method: Stylet and Oral airway Placement Confirmation: ETT inserted through vocal cords under direct vision,  positive ETCO2 and breath sounds checked- equal and bilateral Secured at: 22 cm Tube secured with: Tape Dental Injury: Teeth and Oropharynx as per pre-operative assessment

## 2015-03-31 NOTE — Brief Op Note (Signed)
03/31/2015  1:58 PM  PATIENT:  Tamara [name]  44 y.o. female  PRE-OPERATIVE DIAGNOSIS:  history of breast cancer  POST-OPERATIVE DIAGNOSIS:  history of breast cancer  PROCEDURE:  Procedure(s): BILATERAL BREAST RECONSTRUCTION REVISION WITH BILATERAL LIPOFILLING (Bilateral) LIPOSUCTION WITH LIPOFILLING (Bilateral)  SURGEON:  Surgeon(s) and Role:    * Harlie Ragle Sanger, DO - Primary  PHYSICIAN ASSISTANT: Shawn Rayburn, PA  ASSISTANTS: none   ANESTHESIA:   general  EBL:  Total I/O In: 1600 [I.V.:1600] Out: -   BLOOD ADMINISTERED:none  DRAINS: none   LOCAL MEDICATIONS USED:  MARCAINE    and LIDOCAINE   SPECIMEN:  none  DISPOSITION OF SPECIMEN:  N/A  COUNTS:  YES  TOURNIQUET:  * No tourniquets in log *  DICTATION: .Dragon Dictation  PLAN OF CARE: Discharge to home after PACU  PATIENT DISPOSITION:  PACU - hemodynamically stable.   Delay start of Pharmacological VTE agent (>24hrs) due to surgical blood loss or risk of bleeding: no

## 2015-03-31 NOTE — Anesthesia Postprocedure Evaluation (Signed)
  Anesthesia Post-op Note  Patient: Tamara [name]  Procedure(s) Performed: Procedure(s): BILATERAL BREAST RECONSTRUCTION REVISION WITH BILATERAL LIPOFILLING (Bilateral) LIPOSUCTION WITH LIPOFILLING (Bilateral)  Patient Location: PACU  Anesthesia Type: General   Level of Consciousness: awake, alert  and oriented  Airway and Oxygen Therapy: Patient Spontanous Breathing  Post-op Pain: moderate  Post-op Assessment: Post-op Vital signs reviewed  Post-op Vital Signs: Reviewed  Last Vitals:  Filed Vitals:   03/31/15 1415  BP: 123/63  Pulse: 70  Temp:   Resp: 21    Complications: No apparent anesthesia complications

## 2015-03-31 NOTE — Op Note (Signed)
Operative Note   DATE OF OPERATION: 03/31/2015  LOCATION: [place]  SURGICAL DIVISION: Plastic Surgery  PREOPERATIVE DIAGNOSES:  Breast asymmetry after breast reconstruction  POSTOPERATIVE DIAGNOSES:  same  PROCEDURE:  Lipofilling of bilateral breasts with release of scar tissue / contraction for symmetry after breast reconstruction  SURGEON: Theodoro Kos, DO  ASSISTANT: Erlinda Hong, PA  ANESTHESIA:  General.   COMPLICATIONS: None.   INDICATIONS FOR PROCEDURE:  The patient, Tamara [name] is a 44 y.o. female born on 02-08-1971, is here for treatment of bilateral breast asymmetry after breast reconstruction for breast cancer treatment. MRN: [id number]  CONSENT:  Informed consent was obtained directly from the patient. Risks, benefits and alternatives were fully discussed. Specific risks including but not limited to bleeding, infection, hematoma, seroma, scarring, pain, infection, contracture, asymmetry, wound healing problems, and need for further surgery were all discussed. The patient did have an ample opportunity to have questions answered to satisfaction.   DESCRIPTION OF PROCEDURE:  The patient was taken to the operating room. SCDs were placed and IV antibiotics were given. [name] operative site was prepped and draped in a sterile fashion. A time out was performed and all information was confirmed to be correct.  General anesthesia was administered.  The tumescent was placed in the abdominal area and lateral axillary area of both breasts.  After waiting several minutes for the local to take effect the liposuction was performed.  The fat was prepared and placed in syringes for lipofilling.  The cannister was switched from the fat collection to the suction machine and liposuction done at the lateral breast / axillary area for better contour and symmetry.   There was 800 cc of fat / tumescent removed.  A #15 blade was used to make a small incision in  the breast. The sharp cannula was used to release the scar tissue of the breasts on the superior lateral and medial right and medial superior left.  The fat was used to achieve symmetry and better contour with 150 cc placed on the right and 80 cc placed on the left.  The incisions were closed with 5-0 Monocryl.  The patient tolerated the procedure well.  There were no complications. The patient was allowed to wake from anesthesia, extubated and taken to the recovery room in satisfactory condition.

## 2015-03-31 NOTE — Discharge Instructions (Signed)
May shower on Saturday Continue spanx or binders at all times.  Call your surgeon if you experience:   1.  Fever over 101.0. 2.  Inability to urinate. 3.  Nausea and/or vomiting. 4.  Extreme swelling or bruising at the surgical site. 5.  Continued bleeding from the incision. 6.  Increased pain, redness or drainage from the incision. 7.  Problems related to your pain medication. 8.  Any problems and/or concerns  Post Anesthesia Home Care Instructions  Activity: Get plenty of rest for the remainder of the day. A responsible adult should stay with you for 24 hours following the procedure.  For the next 24 hours, DO NOT: -Drive a car -Paediatric nurse -Drink alcoholic beverages -Take any medication unless instructed by your physician -Make any legal decisions or sign important papers.  Meals: Start with liquid foods such as gelatin or soup. Progress to regular foods as tolerated. Avoid greasy, spicy, heavy foods. If nausea and/or vomiting occur, drink only clear liquids until the nausea and/or vomiting subsides. Call your physician if vomiting continues.  Special Instructions/Symptoms: Your throat may feel dry or sore from the anesthesia or the breathing tube placed in your throat during surgery. If this causes discomfort, gargle with warm salt water. The discomfort should disappear within 24 hours.  If you had a scopolamine patch placed behind your ear for the management of post- operative nausea and/or vomiting:  1. The medication in the patch is effective for 72 hours, after which it should be removed.  Wrap patch in a tissue and discard in the trash. Wash hands thoroughly with soap and water. 2. You may remove the patch earlier than 72 hours if you experience unpleasant side effects which may include dry mouth, dizziness or visual disturbances. 3. Avoid touching the patch. Wash your hands with soap and water after contact with the patch.

## 2015-03-31 NOTE — Anesthesia Preprocedure Evaluation (Signed)
Anesthesia Evaluation  Patient identified by MRN, date of birth, ID band Patient awake    Reviewed: Allergy & Precautions, NPO status , Patient's Chart, lab work & pertinent test results  History of Anesthesia Complications (+) PONV  Airway Mallampati: I  TM Distance: >3 FB Neck ROM: Full    Dental  (+) Teeth Intact, Dental Advisory Given   Pulmonary former smoker,  breath sounds clear to auscultation        Cardiovascular hypertension, Pt. on medications and Pt. on home beta blockers Rhythm:Regular Rate:Normal     Neuro/Psych    GI/Hepatic   Endo/Other    Renal/GU      Musculoskeletal   Abdominal   Peds  Hematology   Anesthesia Other Findings   Reproductive/Obstetrics                             Anesthesia Physical Anesthesia Plan  ASA: II  Anesthesia Plan: General   Post-op Pain Management:    Induction: Intravenous  Airway Management Planned: LMA  Additional Equipment:   Intra-op Plan:   Post-operative Plan: Extubation in OR  Informed Consent: I have reviewed the patients History and Physical, chart, labs and discussed the procedure including the risks, benefits and alternatives for the proposed anesthesia with the patient or authorized representative who has indicated his/her understanding and acceptance.   Dental advisory given  Plan Discussed with: CRNA, Anesthesiologist and Surgeon  Anesthesia Plan Comments:         Anesthesia Quick Evaluation

## 2015-03-31 NOTE — Addendum Note (Signed)
Addendum  created 03/31/15 1445 by Lorrene Reid, MD   Modules edited: Orders, PRL Based Order Sets

## 2015-03-31 NOTE — H&P (Signed)
Tamara [name] is an 44 y.o. female.   Chief Complaint: breast asymmetry HPI: The patient is a  44 y.o. female with a history of breast cancer. She presents for a history and physical for surgery. The patient is schedule to undergo secondary bilateral breast reconstruction for symmetry. She noted a lump in her left lateral breast and underwent a mammogram which was positive for breast cancer.  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 She is 5 feet 5 inches tall, weighs 190 pounds and wore a 36 B. She would like to be around a C cup. She had a c-section and breast feed her child. The BRCA-1 was positive and she has a strong family history for colon cancer and breast cancer. She is currently staged at II (T2 N1).  She completed chemotherapy at the end of July. She did develop an infection in her port site and had to have that removed.   Past Medical History  Diagnosis Date  . Endometriosis   . Hypertension   . Anemia   . Wears glasses   . Iron deficiency anemia, unspecified 02/25/2014  . BRCA1 positive     c.190T>G (p.Cys64Gly) @ [facility]   . History of blood transfusion     last 9'15. due to chemotherapy.  Marland Kitchen DVT (deep venous thrombosis)     Right Subclavian and IJ.. Lovenox stopped 01-10-15 until after surgery planned 02-08-15.  . Fibroid tumor     02-02-15 remains with abdominal pain and some vaginal bleeding due to fibroids.  . Breast cancer 02/01/14    ER-/PR-/Her2+, still receiving chemo 12/21/14, last Herceptin 02-02-15.Left breat cancer.  . Shortness of breath      when Hemoglobin low, now resolved after transfusions 9'15.  . Rash 02/22/2015  . PONV (postoperative nausea and vomiting)     needs scop patch    Past Surgical History  Procedure Laterality Date  . Cervical polypectomy  2010  . Cesarean section      one previous  . Tubal  ligation    . Portacath placement Right 02/23/2014    Procedure: INSERTION PORT-A-CATH;  Surgeon: Joyice Faster. Cornett, MD;  Location: [place];  Service: General;  Laterality: Right;  . Port-a-cath removal N/A 05/21/2014    Procedure: REMOVAL PORT-A-CATH;  Surgeon: Zenovia Jarred, MD;  Location: [place];  Service: General;  Laterality: N/A;  . Tee without cardioversion N/A 05/26/2014    Procedure: TRANSESOPHAGEAL ECHOCARDIOGRAM (TEE);  Surgeon: Larey Dresser, MD;  Location[facility] ENDOSCOPY;  Service: Cardiovascular;  Laterality: N/A;  . Bilateral total mastectomy with axillary lymph node dissection Bilateral 07/28/2014    Procedure: BILATERAL TOTAL MASTECTOMY WITH LEFT AXILLARY SENTINEL LYMPH NODE BIOPSY;  Surgeon: Stark Klein, MD;  Location: [place];  Service: General;  Laterality: Bilateral;  . Breast reconstruction with placement of tissue expander and flex hd (acellular hydrated dermis) Bilateral 07/28/2014    Procedure: BILATERAL BREAST RECONSTRUCTION WITH PLACEMENT OF TISSUE EXPANDER AND FLEX HD (ACELLULAR HYDRATED DERMIS);  Surgeon: Theodoro Kos, DO;  Location: [place];  Service: Plastics;  Laterality: Bilateral;  . Removal of tissue expander and placement of implant Bilateral 10/14/2014    Procedure: REMOVAL OF BILATERAL BREAST  TISSUE EXPANDER  WITH PLASCEMENT OF BILATERAL  BREAST IMPLANTS;  Surgeon: Theodoro Kos, DO;  Location: [place];  Service: Plastics;  Laterality: Bilateral;  . Left heart catheterization  with coronary angiogram N/A 09/13/2014    Procedure: LEFT HEART CATHETERIZATION WITH CORONARY ANGIOGRAM;  Surgeon: Jolaine Artist, MD;  Location: [facility] CATH LAB;  Service: Cardiovascular;  Laterality: N/A;  . Cardiac catheterization      10'15- Dr. Sung Amabile  . Robotic assisted total hysterectomy with bilateral salpingo oopherectomy Bilateral 02/08/2015    Procedure: ROBOTIC ASSISTED TOTAL HYSTERECTOMY WITH BILATERAL SALPINGO OOPHORECTOMY; UTERUS WEIGHING  GREATER THAN 250 GRAMS;  Surgeon: Everitt Amber, MD;  Location: [facility];  Service: Gynecology;  Laterality: Bilateral;  BRCA 1 GENE MUTATION    Family History  Problem Relation Age of Onset  . Breast cancer Mother 52    currently 92  . Diabetes Father   . Pancreatic cancer Father 34  . Breast cancer Paternal Aunt 24    currently 67; BRCA1 positive  . Stroke Maternal Grandfather   . Cancer Paternal Aunt     unk. primary; deceased 14s  . Breast cancer Cousin     daughter of unaffected paternal aunt; dx in her 5s   Social History:  reports that she quit smoking about 6 years ago. She has quit using smokeless tobacco. She reports that she does not drink alcohol or use illicit drugs.  Allergies:  Allergies  Allergen Reactions  . Compazine [Prochlorperazine Edisylate] Other (See Comments)    Stuttering    Medications Prior to Admission  Medication Sig Dispense Refill  . carvedilol (COREG) 12.5 MG tablet Take 1.5 tablets (18.75 mg total) by mouth 2 (two) times daily with a meal. 90 tablet 3  . DULoxetine (CYMBALTA) 30 MG capsule Take 1 capsule (30 mg total) by mouth daily. 30 capsule 3  . ferrous sulfate 325 (65 FE) MG tablet Take 325 mg by mouth 2 (two) times daily with a meal.     . hydrALAZINE (APRESOLINE) 50 MG tablet Take 1 tablet (50 mg total) by mouth every 8 (eight) hours. 90 tablet 3  . hydrochlorothiazide (MICROZIDE) 12.5 MG capsule Take 1 capsule (12.5 mg total) by mouth daily. 90 capsule 3  . ibuprofen (ADVIL,MOTRIN) 800 MG tablet Take 800 mg by mouth every 8 (eight) hours as needed (pain).    Marland Kitchen lisinopril (PRINIVIL,ZESTRIL) 40 MG tablet Take 1 tablet (40 mg total) by mouth daily. 90 tablet 3  . LORazepam (ATIVAN) 0.5 MG tablet Take 1 tablet (0.5 mg total) by mouth every 6 (six) hours as needed (Nausea or vomiting). 30 tablet 0  . acetaminophen (TYLENOL) 325 MG tablet Take 2 tablets (650 mg total) by mouth every 6 (six) hours as needed for mild pain (or Temp > 100). (Patient not  taking: Reported on 03/03/2015)    . cyclobenzaprine (FLEXERIL) 10 MG tablet Take 1 tablet (10 mg total) by mouth 3 (three) times daily as needed for muscle spasms. 30 tablet 0  . ipratropium (ATROVENT) 0.03 % nasal spray Place 2 sprays into the nose 2 (two) times daily. PRN congestion (Patient not taking: Reported on 03/07/2015) 30 mL 0  . lidocaine-prilocaine (EMLA) cream Apply 1 application topically as needed (For port-a-cath.). 30 g 1  . ondansetron (ZOFRAN) 8 MG tablet Take 8 mg by mouth 2 (two) times daily as needed for nausea or vomiting.     Marland Kitchen PRESCRIPTION MEDICATION Chemo at [facility]      No results found for this or any previous visit (from the past 83 hour(s)). No results found.  Review of Systems  Constitutional: Negative.   HENT: Negative.   Eyes: Negative.   Respiratory: Negative.  Cardiovascular: Negative.   Gastrointestinal: Negative.   Genitourinary: Negative.   Musculoskeletal: Negative.   Skin: Negative.   Neurological: Negative.   Psychiatric/Behavioral: Negative.     Blood pressure 104/63, pulse 59, temperature 98 F (36.7 C), temperature source Oral, resp. rate 18, height 5' 5"  (1.651 m), weight 93.951 kg (207 lb 2 oz), last menstrual period 02/19/2014, SpO2 100 %. Physical Exam  Constitutional: She is oriented to person, place, and time. She appears well-developed and well-nourished.  HENT:  Head: Normocephalic and atraumatic.  Eyes: Conjunctivae and EOM are normal. Pupils are equal, round, and reactive to light.  Cardiovascular: Normal rate.   Respiratory: Effort normal.  GI: Soft.  Musculoskeletal: Normal range of motion.  Neurological: She is alert and oriented to person, place, and time.  Skin: Skin is warm.  Psychiatric: She has a normal mood and affect. Her behavior is normal. Judgment and thought content normal.     Assessment/Plan Plan for bilateral lipofilling and excision for symmetry after breast reconstruction.   [place] 03/31/2015,  11:24 AM

## 2015-03-31 NOTE — Transfer of Care (Signed)
Immediate Anesthesia Transfer of Care Note  Patient: Tamara [name]  Procedure(s) Performed: Procedure(s): BILATERAL BREAST RECONSTRUCTION REVISION WITH BILATERAL LIPOFILLING (Bilateral) LIPOSUCTION WITH LIPOFILLING (Bilateral)  Patient Location: PACU  Anesthesia Type:General  Level of Consciousness: awake, alert  and patient cooperative  Airway & Oxygen Therapy: Patient Spontanous Breathing and Patient connected to face mask oxygen  Post-op Assessment: Report given to RN, Post -op Vital signs reviewed and stable and Patient moving all extremities  Post vital signs: Reviewed and stable  Last Vitals:  Filed Vitals:   03/31/15 1354  BP:   Pulse: 72  Temp:   Resp: 19    Complications: No apparent anesthesia complications

## 2015-04-01 ENCOUNTER — Encounter (HOSPITAL_BASED_OUTPATIENT_CLINIC_OR_DEPARTMENT_OTHER): Payer: Self-pay | Admitting: Plastic Surgery

## 2015-04-01 NOTE — Addendum Note (Signed)
Addendum  created 04/01/15 2500 by Tawni Millers, CRNA   Modules edited: Charges VN

## 2015-04-05 ENCOUNTER — Telehealth: Payer: Self-pay | Admitting: Oncology

## 2015-04-05 ENCOUNTER — Ambulatory Visit (HOSPITAL_BASED_OUTPATIENT_CLINIC_OR_DEPARTMENT_OTHER): Payer: Medicaid Other | Admitting: Oncology

## 2015-04-05 ENCOUNTER — Other Ambulatory Visit (HOSPITAL_BASED_OUTPATIENT_CLINIC_OR_DEPARTMENT_OTHER): Payer: Medicaid Other

## 2015-04-05 ENCOUNTER — Ambulatory Visit: Payer: Medicaid Other

## 2015-04-05 ENCOUNTER — Other Ambulatory Visit: Payer: Medicaid Other

## 2015-04-05 VITALS — BP 122/80 | HR 62 | Temp 97.9°F | Resp 18 | Ht 65.0 in | Wt 211.1 lb

## 2015-04-05 DIAGNOSIS — C50412 Malignant neoplasm of upper-outer quadrant of left female breast: Secondary | ICD-10-CM

## 2015-04-05 DIAGNOSIS — Z86718 Personal history of other venous thrombosis and embolism: Secondary | ICD-10-CM

## 2015-04-05 DIAGNOSIS — Z1501 Genetic susceptibility to malignant neoplasm of breast: Secondary | ICD-10-CM

## 2015-04-05 DIAGNOSIS — C50811 Malignant neoplasm of overlapping sites of right female breast: Secondary | ICD-10-CM

## 2015-04-05 DIAGNOSIS — Z1509 Genetic susceptibility to other malignant neoplasm: Principal | ICD-10-CM

## 2015-04-05 DIAGNOSIS — Z803 Family history of malignant neoplasm of breast: Secondary | ICD-10-CM

## 2015-04-05 LAB — CBC WITH DIFFERENTIAL/PLATELET
BASO%: 0.6 % (ref 0.0–2.0)
BASOS ABS: 0 10*3/uL (ref 0.0–0.1)
EOS ABS: 0.3 10*3/uL (ref 0.0–0.5)
EOS%: 3.1 % (ref 0.0–7.0)
HCT: 33.1 % — ABNORMAL LOW (ref 34.8–46.6)
HGB: 10.3 g/dL — ABNORMAL LOW (ref 11.6–15.9)
LYMPH%: 19.3 % (ref 14.0–49.7)
MCH: 25 pg — AB (ref 25.1–34.0)
MCHC: 31.3 g/dL — ABNORMAL LOW (ref 31.5–36.0)
MCV: 80 fL (ref 79.5–101.0)
MONO#: 0.4 10*3/uL (ref 0.1–0.9)
MONO%: 5.1 % (ref 0.0–14.0)
NEUT%: 71.9 % (ref 38.4–76.8)
NEUTROS ABS: 5.7 10*3/uL (ref 1.5–6.5)
PLATELETS: 306 10*3/uL (ref 145–400)
RBC: 4.14 10*6/uL (ref 3.70–5.45)
RDW: 19.4 % — ABNORMAL HIGH (ref 11.2–14.5)
WBC: 8 10*3/uL (ref 3.9–10.3)
lymph#: 1.5 10*3/uL (ref 0.9–3.3)

## 2015-04-05 LAB — COMPREHENSIVE METABOLIC PANEL (CC13)
ALT: 17 U/L (ref 0–55)
AST: 12 U/L (ref 5–34)
Albumin: 3.4 g/dL — ABNORMAL LOW (ref 3.5–5.0)
Alkaline Phosphatase: 96 U/L (ref 40–150)
Anion Gap: 9 mEq/L (ref 3–11)
BUN: 16.5 mg/dL (ref 7.0–26.0)
CHLORIDE: 106 meq/L (ref 98–109)
CO2: 25 mEq/L (ref 22–29)
CREATININE: 0.8 mg/dL (ref 0.6–1.1)
Calcium: 9.1 mg/dL (ref 8.4–10.4)
Glucose: 95 mg/dl (ref 70–140)
POTASSIUM: 3.7 meq/L (ref 3.5–5.1)
Sodium: 140 mEq/L (ref 136–145)
Total Bilirubin: 0.42 mg/dL (ref 0.20–1.20)
Total Protein: 6.6 g/dL (ref 6.4–8.3)

## 2015-04-05 NOTE — Progress Notes (Signed)
ID: Su Grand OB: May 17, 1971  MR#: [id number]  CSN#:[id number]  PCP: Angelica Chessman, MD GYN:   SU: Dr. Erroll Luna OTHER MD: Dr. 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 There was also an 8 mm mass 8 cm from the nipple in the ipsilateral breast, as well as positive lymph nodes. 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 MRI confirmed 2.8 and 1.3 cm mass is as well as lymph node. On the right a 6 mm nodule, biopsied and negative for malignancy. PET scan 08-17-04 had hypermetabolic left breast mass consistent with known neoplasm and FDG positive left axillary lymph nodes,benign-appearing brown fat activity and muscular activity in the  neck and chest but no findings for metastatic disease involving the neck, chest, abdomen, pelvis or bones. Moderate FDG activity in the endometrial canal is thought likely due to secretory phase of ovulation or menses. No mass, uterine fibroids present. CT CAP 02-19-14 had 3 cm left breast mass, enlarged left axillary lymph nodes are positive and no CT findings for metastatic disease involving the chest,abdomen or pelvis and no evidence of osseous metastatic disease. Mildly enlarged fibroid uterus."   Her subsequent treatment is as detailed below   INTERVAL HISTORY:   Nishat returns today for follow up of her breast  cancer. Since her last visit with me she underwent hysterectomy and bilateral salpingo-oophorectomy 02/08/2015. The pathology ([facility] [id number]) was benign. She had her most recent echo 03/01/2015. This showed a slight drop and recurrent cardiomyopathy and therefore her Herceptin was discontinued. On 03/31/2015 she also had a revision of her breast reconstruction. She is still recovering from that  REVIEW OF SYSTEMS: Soleil in addition to the discomfort from the recent surgery  has been having a severe headache now for 2 days. She took some oxycodone for that but it didn't work very well. Motrin work better, but it only worked for little bit. She has had no nausea or vomiting with it, no visual changes. She has also been having some discomfort in her left shin area, with no swelling. She feels short of breath when climbing stairs but not otherwise. She feels depressed. She is having hot flashes. A detailed review of systems today was otherwise stable.  PAST MEDICAL HISTORY: Past Medical History  Diagnosis Date  . Endometriosis   . Hypertension   . Anemia   . Wears glasses   . Iron deficiency anemia, unspecified 02/25/2014  . BRCA1 positive     c.190T>G (p.Cys64Gly) @ [facility]   . History of blood transfusion     last 9'15. due to chemotherapy.  Marland Kitchen DVT (deep venous thrombosis)     Right Subclavian and IJ.. Lovenox stopped 01-10-15 until after surgery planned 02-08-15.  . Fibroid tumor     02-02-15 remains with abdominal pain and some vaginal bleeding due to fibroids.  . Breast cancer 02/01/14    ER-/PR-/Her2+, still receiving chemo 12/21/14, last Herceptin 02-02-15.Left breat cancer.  . Shortness  of breath      when Hemoglobin low, now resolved after transfusions 9'15.  . Rash 02/22/2015  . PONV (postoperative nausea and vomiting)     needs scop patch    PAST SURGICAL HISTORY: Past Surgical History  Procedure Laterality Date  . Cervical polypectomy  2010  . Cesarean section      one previous  . Tubal  ligation    . Portacath placement Right 02/23/2014    Procedure: INSERTION PORT-A-CATH;  Surgeon: Joyice Faster. Cornett, MD;  Location: [place];  Service: General;  Laterality: Right;  . Port-a-cath removal N/A 05/21/2014    Procedure: REMOVAL PORT-A-CATH;  Surgeon: Zenovia Jarred, MD;  Location: [place];  Service: General;  Laterality: N/A;  . Tee without cardioversion N/A 05/26/2014    Procedure: TRANSESOPHAGEAL ECHOCARDIOGRAM (TEE);  Surgeon: Larey Dresser, MD;  Location: [facility] ENDOSCOPY;  Service: Cardiovascular;  Laterality: N/A;  . Bilateral total mastectomy with axillary lymph node dissection Bilateral 07/28/2014    Procedure: BILATERAL TOTAL MASTECTOMY WITH LEFT AXILLARY SENTINEL LYMPH NODE BIOPSY;  Surgeon: Stark Klein, MD;  Location: [place];  Service: General;  Laterality: Bilateral;  . Breast reconstruction with placement of tissue expander and flex hd (acellular hydrated dermis) Bilateral 07/28/2014    Procedure: BILATERAL BREAST RECONSTRUCTION WITH PLACEMENT OF TISSUE EXPANDER AND FLEX HD (ACELLULAR HYDRATED DERMIS);  Surgeon: Theodoro Kos, DO;  Location: [place];  Service: Plastics;  Laterality: Bilateral;  . Removal of tissue expander and placement of implant Bilateral 10/14/2014    Procedure: REMOVAL OF BILATERAL BREAST  TISSUE EXPANDER  WITH PLASCEMENT OF BILATERAL  BREAST IMPLANTS;  Surgeon: Theodoro Kos, DO;  Location: [place];  Service: Plastics;  Laterality: Bilateral;  . Left heart catheterization with coronary angiogram N/A 09/13/2014    Procedure: LEFT HEART CATHETERIZATION WITH CORONARY ANGIOGRAM;  Surgeon: Jolaine Artist, MD;  Location: [facility] CATH LAB;  Service: Cardiovascular;  Laterality: N/A;  . Cardiac catheterization      10'15- Dr. Sung Amabile  . Robotic assisted total hysterectomy with bilateral salpingo oopherectomy Bilateral 02/08/2015    Procedure: ROBOTIC ASSISTED TOTAL HYSTERECTOMY WITH BILATERAL SALPINGO OOPHORECTOMY; UTERUS WEIGHING  GREATER THAN 250 GRAMS;  Surgeon: Everitt Amber, MD;  Location: [facility];  Service: Gynecology;  Laterality: Bilateral;  BRCA 1 GENE MUTATION  . Breast reconstruction Bilateral 03/31/2015    Procedure: BILATERAL BREAST RECONSTRUCTION REVISION WITH BILATERAL LIPOFILLING;  Surgeon: Theodoro Kos, DO;  Location: [place];  Service: Plastics;  Laterality: Bilateral;  . Liposuction with lipofilling Bilateral 03/31/2015    Procedure: LIPOSUCTION WITH LIPOFILLING;  Surgeon: Theodoro Kos, DO;  Location: [place];  Service: Plastics;  Laterality: Bilateral;    FAMILY HISTORY Family History  Problem Relation Age of Onset  . Breast cancer Mother 38    currently 46  . Diabetes Father   . Pancreatic cancer Father 50  . Breast cancer Paternal Aunt 59    currently 49; BRCA1 positive  . Stroke Maternal Grandfather   . Cancer Paternal Aunt     unk. primary; deceased 87s  . Breast cancer Cousin     daughter of unaffected paternal aunt; dx in her 30s  [name] father died from prostate cancer the age of 83. [name] mother was diagnosed with breast cancer the age of 80. [name] father had 5 sisters, 3 of whom were diagnosed with breast cancer, 2 of them before the age of 36. The patient had one brother, no  sisters. There is no history of ovarian cancer in the family.  GYNECOLOGIC HISTORY:  Menarche age 24, first live birth age 55, the patient is [place] P5. Her periods stopped at the time of chemotherapy. 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 Her husband [name] since works as an Animal nutritionist. [name] oldest child, a son, [name], is studying Therapist, occupational; the patient is a 89 year old daughter [name] is also in college. [name] younger children are 47, 18, and 70. The patient attends a Arboriculturist church   ADVANCED DIRECTIVES: Not in place   HEALTH MAINTENANCE: History   Substance Use Topics  . Smoking status: Former Smoker -- 0.25 packs/day for 15 years    Quit date: 02/18/2009  . Smokeless tobacco: Former Systems developer  . Alcohol Use: No     Comment: occasional   Colonoscopy: Bone Density Scan:  Pap Smear:  Eye Exam:  Vitamin D Level:   Lipid Panel:    Allergies  Allergen Reactions  . Compazine [Prochlorperazine Edisylate] Other (See Comments)    Stuttering    Current Outpatient Prescriptions  Medication Sig Dispense Refill  . acetaminophen (TYLENOL) 325 MG tablet Take 2 tablets (650 mg total) by mouth every 6 (six) hours as needed for mild pain (or Temp > 100). (Patient not taking: Reported on 03/03/2015)    . carvedilol (COREG) 12.5 MG tablet Take 1.5 tablets (18.75 mg total) by mouth 2 (two) times daily with a meal. 90 tablet 3  . cyclobenzaprine (FLEXERIL) 10 MG tablet Take 1 tablet (10 mg total) by mouth 3 (three) times daily as needed for muscle spasms. 30 tablet 0  . DULoxetine (CYMBALTA) 30 MG capsule Take 1 capsule (30 mg total) by mouth daily. 30 capsule 3  . ferrous sulfate 325 (65 FE) MG tablet Take 325 mg by mouth 2 (two) times daily with a meal.     . hydrALAZINE (APRESOLINE) 50 MG tablet Take 1 tablet (50 mg total) by mouth every 8 (eight) hours. 90 tablet 3  . hydrochlorothiazide (MICROZIDE) 12.5 MG capsule Take 1 capsule (12.5 mg total) by mouth daily. 90 capsule 3  . ibuprofen (ADVIL,MOTRIN) 800 MG tablet Take 800 mg by mouth every 8 (eight) hours as needed (pain).    Marland Kitchen ipratropium (ATROVENT) 0.03 % nasal spray Place 2 sprays into the nose 2 (two) times daily. PRN congestion (Patient not taking: Reported on 03/07/2015) 30 mL 0  . lidocaine-prilocaine (EMLA) cream Apply 1 application topically as needed (For port-a-cath.). 30 g 1  . lisinopril (PRINIVIL,ZESTRIL) 40 MG tablet Take 1 tablet (40 mg total) by mouth daily. 90 tablet 3  . LORazepam (ATIVAN) 0.5 MG tablet Take 1 tablet (0.5 mg total) by mouth every 6 (six) hours as needed (Nausea  or vomiting). 30 tablet 0  . ondansetron (ZOFRAN) 8 MG tablet Take 8 mg by mouth 2 (two) times daily as needed for nausea or vomiting.     Marland Kitchen oxyCODONE (ROXICODONE) 5 MG immediate release tablet Take 1 tablet (5 mg total) by mouth every 4 (four) hours as needed for moderate pain or severe pain. 40 tablet 0  . PRESCRIPTION MEDICATION Chemo at [facility]     No current facility-administered medications for this visit.    OBJECTIVE: Young-appearing African American woman  Filed Vitals:   04/05/15 1043  BP: 122/80  Pulse: 62  Temp: 97.9 F (36.6 C)  Resp: 18  Body mass index is 35.13 kg/(m^2).      ECOG FS:2 - Symptomatic, <50% confined to bed  Sclerae unicteric, pupils equal and reactive, EOMs intact Oropharynx clear and moist-- no thrush or other lesions No cervical or supraclavicular adenopathy Lungs no rales or rhonchi Heart regular rate and rhythm Abd soft, nontender, positive bowel sounds MSK no focal spinal tenderness, no upper extremity lymphedema Neuro: nonfocal, well oriented, appropriate affect Breasts: Status post bilateral mastectomies with bilateral implant reconstruction. The cosmetic result is excellent. There is no evidence of chest wall recurrence.    LAB RESULTS:  CMP     Component Value Date/Time   NA 142 03/03/2015 2308   NA 143 02/22/2015 1124   K 3.8 03/03/2015 2308   K 3.9 02/22/2015 1124   CL 106 03/03/2015 2308   CO2 27 03/03/2015 2308   CO2 23 02/22/2015 1124   GLUCOSE 100* 03/03/2015 2308   GLUCOSE 92 02/22/2015 1124   BUN 21 03/03/2015 2308   BUN 13.4 02/22/2015 1124   CREATININE 1.02 03/03/2015 2308   CREATININE 0.8 02/22/2015 1124   CALCIUM 9.6 03/03/2015 2308   CALCIUM 9.4 02/22/2015 1124   PROT 7.2 03/03/2015 2308   PROT 7.2 02/22/2015 1124   ALBUMIN 4.0 03/03/2015 2308   ALBUMIN 3.6 02/22/2015 1124   AST 18 03/03/2015 2308   AST 12 02/22/2015 1124   ALT 16 03/03/2015 2308   ALT 15 02/22/2015 1124   ALKPHOS 92 03/03/2015 2308    ALKPHOS 93 02/22/2015 1124   BILITOT 0.3 03/03/2015 2308   BILITOT <0.20 02/22/2015 1124   GFRNONAA 67* 03/03/2015 2308   GFRAA 78* 03/03/2015 2308    I No results found for: SPEP  Lab Results  Component Value Date   WBC 8.0 04/05/2015   NEUTROABS 5.7 04/05/2015   HGB 10.3* 04/05/2015   HCT 33.1* 04/05/2015   MCV 80.0 04/05/2015   PLT 306 04/05/2015        Chemistry      Component Value Date/Time   NA 142 03/03/2015 2308   NA 143 02/22/2015 1124   K 3.8 03/03/2015 2308   K 3.9 02/22/2015 1124   CL 106 03/03/2015 2308   CO2 27 03/03/2015 2308   CO2 23 02/22/2015 1124   BUN 21 03/03/2015 2308   BUN 13.4 02/22/2015 1124   CREATININE 1.02 03/03/2015 2308   CREATININE 0.8 02/22/2015 1124      Component Value Date/Time   CALCIUM 9.6 03/03/2015 2308   CALCIUM 9.4 02/22/2015 1124   ALKPHOS 92 03/03/2015 2308   ALKPHOS 93 02/22/2015 1124   AST 18 03/03/2015 2308   AST 12 02/22/2015 1124   ALT 16 03/03/2015 2308   ALT 15 02/22/2015 1124   BILITOT 0.3 03/03/2015 2308   BILITOT <0.20 02/22/2015 1124       No results found for: LABCA2  No components found for: LABCA125  No results for input(s): INR in the last 168 hours.  Urinalysis    Component Value Date/Time   COLORURINE RED* 02/02/2015 1030   APPEARANCEUR CLOUDY* 02/02/2015 1030   LABSPEC 1.025 02/16/2015 1243   LABSPEC 1.024 02/02/2015 1030   PHURINE 6.0 02/02/2015 1030   GLUCOSEU Negative 02/16/2015 1243   GLUCOSEU NEGATIVE 02/02/2015 1030   HGBUR LARGE* 02/02/2015 1030   BILIRUBINUR NEGATIVE 02/02/2015 1030   KETONESUR NEGATIVE 02/02/2015 1030   PROTEINUR 30* 02/02/2015 1030   UROBILINOGEN 0.2 02/16/2015 1243   UROBILINOGEN 0.2 02/02/2015 1030   NITRITE Negative 02/16/2015 1243  NITRITE NEGATIVE 02/02/2015 1030   LEUKOCYTESUR SMALL* 02/02/2015 1030    STUDIES: No results found.  Assessment: 44 y.o. BRCA-1 positive [place] woman  1. S/p left breast upper outer quadrant biopsy of two  separate breast masses and one axillary lymph node 01/29/2014 for a , clinical mT2 N1 stage IIB, invasive ductal carcinoma, grade 2-3,  estrogen and progesterone receptor negative, with an MIB-1 between 79-100%, and HER 2 amplified  2. completed 6 cycles of carboplatin, docetaxel, trastuzumab and pertuzumab 06/30/2014 with MRI 07/07/2014 showing a complete radiologic response  3. trastuzumab was to be continued to complete a year (through March 2016); however echocardiogram 07/05/2014 showed an ejection fraction of 45-50%-- trastuzumab was held after 06/30/2014 dose until EF recovery, resumed 11/09/2014  (a) cath report from 09/13/2014 shows normal coronaries and a normal left ventricular function with an ejection fraction of 55%.  (b) echo 03/01/2015 suggest continuing mild cardiomyopathy: Trastuzumab discontinued--final dose 02/22/2015  4. 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   5. MRSA port infection and septicemia mid-June 2015;  Port was removed, and  PICC line placed, completed antibiotic therapy with ANCEF; PICC subsequently pulled   6 genetic testing with the [facility] panel and was found to have a pathogenic mutation in the BRCA1 gene called c.190T>G (p.Cys64Gly  7. status post bilateral mastectomies and left axillary sentinel lymph node biopsy, withimmediate expander placement, on 07/28/14; the pathology ([id number]) showed a complete pathologic response in the left breast and the 3 sentinel lymph nodes sampled; the right breast was benign  (a) bilateral breast reconstruction revision 03/31/2015  8. Bilateral salpingo-oophorectomy and hysterectomy 02/08/15 with benign pathology  PLAN:  Terryann is done with systemic and local treatment of her breast cancer (she is considering nipple tattoos). She is still recovering of course from the recent surgery, but no further cancer specific treatment as planned. She is in remission and at this  point we are initiating long-term follow-up.  She will see her primary care physician later this month. She will see Dr. Haroldine Laws in June. She will continue to work with Dr. Migdalia Dk until the reconstruction has been completed to the patient's satisfaction (she is very pleased so far).  She will see [place] again in 3 months. We will do lab work and physical exam at that time. She understands she does not need breast MRI or mammography in the future. The physical exam alone is extremely sensitive in this setting. Once we have our breast survivorship nurse practitioner in place we will transition Kirs[place]a to her care alternating visits with me until she completes 5 years of follow-up  The patient has a good understanding of the overall plan. She agrees with it. She knows the goal of treatment in her case is cure. She will call with any problems that may develop before her next visit here. [name], MD  04/05/2015 10:49 AM

## 2015-04-05 NOTE — Telephone Encounter (Signed)
Left message to confirm appointment for July/August. Mailed calendar.

## 2015-04-07 ENCOUNTER — Other Ambulatory Visit: Payer: Medicaid Other

## 2015-04-11 ENCOUNTER — Encounter: Payer: Self-pay | Admitting: Internal Medicine

## 2015-04-11 ENCOUNTER — Ambulatory Visit: Payer: Medicaid Other | Attending: Internal Medicine | Admitting: Internal Medicine

## 2015-04-11 VITALS — BP 130/85 | HR 76 | Temp 98.2°F | Resp 16 | Ht 65.0 in | Wt 209.8 lb

## 2015-04-11 DIAGNOSIS — I10 Essential (primary) hypertension: Secondary | ICD-10-CM

## 2015-04-11 DIAGNOSIS — C50412 Malignant neoplasm of upper-outer quadrant of left female breast: Secondary | ICD-10-CM | POA: Insufficient documentation

## 2015-04-11 DIAGNOSIS — F331 Major depressive disorder, recurrent, moderate: Secondary | ICD-10-CM | POA: Insufficient documentation

## 2015-04-11 MED ORDER — FERROUS SULFATE 325 (65 FE) MG PO TABS: 325.0000 mg | ORAL_TABLET | Freq: Three times a day (TID) | ORAL | Status: DC

## 2015-04-11 MED ORDER — DULOXETINE HCL 60 MG PO CPEP: 60.0000 mg | ORAL_CAPSULE | Freq: Every day | ORAL | Status: DC

## 2015-04-11 MED ORDER — LORAZEPAM 0.5 MG PO TABS: 0.5000 mg | ORAL_TABLET | Freq: Two times a day (BID) | ORAL | Status: DC

## 2015-04-11 NOTE — Patient Instructions (Signed)
DASH Eating Plan DASH stands for "Dietary Approaches to Stop Hypertension." The DASH eating plan is a healthy eating plan that has been shown to reduce high blood pressure (hypertension). Additional health benefits may include reducing the risk of type 2 diabetes mellitus, heart disease, and stroke. The DASH eating plan may also help with weight loss. WHAT DO I NEED TO KNOW ABOUT THE DASH EATING PLAN? For the DASH eating plan, you will follow these general guidelines:  Choose foods with a percent daily value for sodium of less than 5% (as listed on the food label).  Use salt-free seasonings or herbs instead of table salt or sea salt.  Check with your health care provider or pharmacist before using salt substitutes.  Eat lower-sodium products, often labeled as "lower sodium" or "no salt added."  Eat fresh foods.  Eat more vegetables, fruits, and low-fat dairy products.  Choose whole grains. Look for the word "whole" as the first word in the ingredient list.  Choose fish and skinless chicken or turkey more often than red meat. Limit fish, poultry, and meat to 6 oz (170 g) each day.  Limit sweets, desserts, sugars, and sugary drinks.  Choose heart-healthy fats.  Limit cheese to 1 oz (28 g) per day.  Eat more home-cooked food and less restaurant, buffet, and fast food.  Limit fried foods.  Cook foods using methods other than frying.  Limit canned vegetables. If you do use them, rinse them well to decrease the sodium.  When eating at a restaurant, ask that your food be prepared with less salt, or no salt if possible. WHAT FOODS CAN I EAT? Seek help from a dietitian for individual calorie needs. Grains Whole grain or whole wheat bread. Brown rice. Whole grain or whole wheat pasta. Quinoa, bulgur, and whole grain cereals. Low-sodium cereals. Corn or whole wheat flour tortillas. Whole grain cornbread. Whole grain crackers. Low-sodium crackers. Vegetables Fresh or frozen vegetables  (raw, steamed, roasted, or grilled). Low-sodium or reduced-sodium tomato and vegetable juices. Low-sodium or reduced-sodium tomato sauce and paste. Low-sodium or reduced-sodium canned vegetables.  Fruits All fresh, canned (in natural juice), or frozen fruits. Meat and Other Protein Products Ground beef (85% or leaner), grass-fed beef, or beef trimmed of fat. Skinless chicken or turkey. Ground chicken or turkey. Pork trimmed of fat. All fish and seafood. Eggs. Dried beans, peas, or lentils. Unsalted nuts and seeds. Unsalted canned beans. Dairy Low-fat dairy products, such as skim or 1% milk, 2% or reduced-fat cheeses, low-fat ricotta or cottage cheese, or plain low-fat yogurt. Low-sodium or reduced-sodium cheeses. Fats and Oils Tub margarines without trans fats. Light or reduced-fat mayonnaise and salad dressings (reduced sodium). Avocado. Safflower, olive, or canola oils. Natural peanut or almond butter. Other Unsalted popcorn and pretzels. The items listed above may not be a complete list of recommended foods or beverages. Contact your dietitian for more options. WHAT FOODS ARE NOT RECOMMENDED? Grains White bread. White pasta. White rice. Refined cornbread. Bagels and croissants. Crackers that contain trans fat. Vegetables Creamed or fried vegetables. Vegetables in a cheese sauce. Regular canned vegetables. Regular canned tomato sauce and paste. Regular tomato and vegetable juices. Fruits Dried fruits. Canned fruit in light or heavy syrup. Fruit juice. Meat and Other Protein Products Fatty cuts of meat. Ribs, chicken wings, bacon, sausage, bologna, salami, chitterlings, fatback, hot dogs, bratwurst, and packaged luncheon meats. Salted nuts and seeds. Canned beans with salt. Dairy Whole or 2% milk, cream, half-and-half, and cream cheese. Whole-fat or sweetened yogurt. Full-fat   cheeses or blue cheese. Nondairy creamers and whipped toppings. Processed cheese, cheese spreads, or cheese  curds. Condiments Onion and garlic salt, seasoned salt, table salt, and sea salt. Canned and packaged gravies. Worcestershire sauce. Tartar sauce. Barbecue sauce. Teriyaki sauce. Soy sauce, including reduced sodium. Steak sauce. Fish sauce. Oyster sauce. Cocktail sauce. Horseradish. Ketchup and mustard. Meat flavorings and tenderizers. Bouillon cubes. Hot sauce. Tabasco sauce. Marinades. Taco seasonings. Relishes. Fats and Oils Butter, stick margarine, lard, shortening, ghee, and bacon fat. Coconut, palm kernel, or palm oils. Regular salad dressings. Other Pickles and olives. Salted popcorn and pretzels. The items listed above may not be a complete list of foods and beverages to avoid. Contact your dietitian for more information. WHERE CAN I FIND MORE INFORMATION? National Heart, Lung, and [employer] Institute: www.nhlbi.nih.gov/health/health-topics/topics/dash/ Document Released: 11/08/2011 Document Revised: 04/05/2014 Document Reviewed: 09/23/2013 ExitCare Patient Information 2015 [employer]. This information is not intended to replace advice given to you by your health care provider. Make sure you discuss any questions you have with your health care provider. Hypertension Hypertension, commonly called high blood pressure, is when the force of blood pumping through your arteries is too strong. Your arteries are the blood vessels that carry blood from your heart throughout your body. A blood pressure reading consists of a higher number over a lower number, such as 110/72. The higher number (systolic) is the pressure inside your arteries when your heart pumps. The lower number (diastolic) is the pressure inside your arteries when your heart relaxes. Ideally you want your blood pressure below 120/80. Hypertension forces your heart to work harder to pump blood. Your arteries may become narrow or stiff. Having hypertension puts you at risk for heart disease, stroke, and other problems.  RISK  FACTORS Some risk factors for high blood pressure are controllable. Others are not.  Risk factors you cannot control include:   Race. You may be at higher risk if you are African American.  Age. Risk increases with age.  Gender. Men are at higher risk than women before age 45 years. After age 65, women are at higher risk than men. Risk factors you can control include:  Not getting enough exercise or physical activity.  Being overweight.  Getting too much fat, sugar, calories, or salt in your diet.  Drinking too much alcohol. SIGNS AND SYMPTOMS Hypertension does not usually cause signs or symptoms. Extremely high blood pressure (hypertensive crisis) may cause headache, anxiety, shortness of breath, and nosebleed. DIAGNOSIS  To check if you have hypertension, your health care provider will measure your blood pressure while you are seated, with your arm held at the level of your heart. It should be measured at least twice using the same arm. Certain conditions can cause a difference in blood pressure between your right and left arms. A blood pressure reading that is higher than normal on one occasion does not mean that you need treatment. If one blood pressure reading is high, ask your health care provider about having it checked again. TREATMENT  Treating high blood pressure includes making lifestyle changes and possibly taking medicine. Living a healthy lifestyle can help lower high blood pressure. You may need to change some of your habits. Lifestyle changes may include:  Following the DASH diet. This diet is high in fruits, vegetables, and whole grains. It is low in salt, red meat, and added sugars.  Getting at least 2 hours of brisk physical activity every week.  Losing weight if necessary.  Not smoking.  Limiting   alcoholic beverages.  Learning ways to reduce stress. If lifestyle changes are not enough to get your blood pressure under control, your health care provider may  prescribe medicine. You may need to take more than one. Work closely with your health care provider to understand the risks and benefits. HOME CARE INSTRUCTIONS  Have your blood pressure rechecked as directed by your health care provider.   Take medicines only as directed by your health care provider. Follow the directions carefully. Blood pressure medicines must be taken as prescribed. The medicine does not work as well when you skip doses. Skipping doses also puts you at risk for problems.   Do not smoke.   Monitor your blood pressure at home as directed by your health care provider. SEEK MEDICAL CARE IF:   You think you are having a reaction to medicines taken.  You have recurrent headaches or feel dizzy.  You have swelling in your ankles.  You have trouble with your vision. SEEK IMMEDIATE MEDICAL CARE IF:  You develop a severe headache or confusion.  You have unusual weakness, numbness, or feel faint.  You have severe chest or abdominal pain.  You vomit repeatedly.  You have trouble breathing. MAKE SURE YOU:   Understand these instructions.  Will watch your condition.  Will get help right away if you are not doing well or get worse. Document Released: 11/19/2005 Document Revised: 04/05/2014 Document Reviewed: 09/11/2013 ExitCare Patient Information 2015 [employer]. This information is not intended to replace advice given to you by your health care provider. Make sure you discuss any questions you have with your health care provider.  

## 2015-04-11 NOTE — Progress Notes (Signed)
Pt comes in for f/u bilat breast reconstruction surgery x 2 weeks ago States she is feeling better with pain improvement Requesting refill Cymbalta and Lorazepam

## 2015-04-11 NOTE — Progress Notes (Signed)
Patient ID: Tamara [name], female   DOB: 10-13-1971, 44 y.o.   MRN: [id number]   Venissa Nappi, is a 44 y.o. female  [id number]:[id number]  [id number]:[id number]  DOB - 09/29/1971  Chief Complaint  Patient presents with  . Follow-up        Subjective:   Tamara [name] is a 44 y.o. female here today for a follow up visit. 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 She is here today for follow-up breast reconstructive surgery. She has no complaints today. She is still recovering from the recent surgery. She is having hot flashes and increasing anxiety, she verbalized ongoing depression and worry about her body she chose to have changed since mastectomies and hysterectomy. She specifically mentioned the absence of nipples and loss of sexual drive. She requests a refill of some of her medications. She claims her blood pressure has been controlled at home and even in other physician's offices, she is compliant with her medications with no side effects reported. She follows up with her therapist. She denies any suicidal ideation or homicidal ideation. Overall patient claims she is doing much better than the previous visit. Patient has No headache, No chest pain, No abdominal pain - No Nausea, No new weakness tingling or numbness, No Cough - SOB.  No problems updated.  ALLERGIES: Allergies  Allergen Reactions  . Compazine [Prochlorperazine Edisylate] Other (See Comments)    Stuttering    PAST MEDICAL HISTORY: Past Medical History  Diagnosis Date  . Endometriosis   . Hypertension   . Anemia   . Wears glasses   . Iron deficiency anemia, unspecified 02/25/2014  . BRCA1 positive    c.190T>G (p.Cys64Gly) @ [facility]   . History of blood transfusion     last 9'15. due to chemotherapy.  Marland Kitchen DVT (deep venous thrombosis)     Right Subclavian and IJ.. Lovenox stopped 01-10-15 until after surgery planned 02-08-15.  . Fibroid tumor     02-02-15 remains with abdominal pain and some vaginal bleeding due to fibroids.  . Breast cancer 02/01/14    ER-/PR-/Her2+, still receiving chemo 12/21/14, last Herceptin 02-02-15.Left breat cancer.  . Shortness of breath      when Hemoglobin low, now resolved after transfusions 9'15.  . Rash 02/22/2015  . PONV (postoperative nausea and vomiting)     needs scop patch    MEDICATIONS AT HOME: Prior to Admission medications   Medication Sig Start Date End Date Taking? Authorizing Provider  carvedilol (COREG) 12.5 MG tablet Take 1.5 tablets (18.75 mg total) by mouth 2 (two) times daily with a meal. 03/01/15  Yes Jolaine Artist, MD  DULoxetine (CYMBALTA) 60 MG capsule Take 1 capsule (60 mg total) by mouth daily. 04/11/15  Yes Tresa Garter, MD  ferrous sulfate 325 (65 FE) MG tablet Take 1 tablet (325 mg total) by mouth 3 (three) times daily with meals. 04/11/15  Yes Tresa Garter, MD  hydrALAZINE (APRESOLINE) 50 MG tablet Take 1 tablet (50 mg total) by mouth every 8 (eight) hours. 03/01/15  Yes Jolaine Artist, MD  hydrochlorothiazide (MICROZIDE) 12.5 MG capsule Take 1 capsule (12.5 mg total) by mouth daily. 03/01/15  Yes Jolaine Artist, MD  ibuprofen (ADVIL,MOTRIN) 800 MG tablet Take 800 mg by mouth every  8 (eight) hours as needed (pain).   Yes Historical Provider, MD  lisinopril (PRINIVIL,ZESTRIL) 40 MG tablet Take 1 tablet (40 mg total) by mouth daily. 03/01/15  Yes Jolaine Artist, MD  oxyCODONE (ROXICODONE) 5 MG immediate release tablet Take 1 tablet (5 mg total) by mouth every 4 (four) hours as needed for moderate pain or severe pain. 03/31/15  Yes Shawn M Rayburn, PA-C  acetaminophen (TYLENOL) 325 MG tablet Take 2 tablets (650 mg total) by  mouth every 6 (six) hours as needed for mild pain (or Temp > 100). Patient not taking: Reported on 03/03/2015 07/30/14   Shawn M Rayburn, PA-C  ipratropium (ATROVENT) 0.03 % nasal spray Place 2 sprays into the nose 2 (two) times daily. PRN congestion Patient not taking: Reported on 03/07/2015 09/01/14   Jarrett Soho Muthersbaugh, PA-C  LORazepam (ATIVAN) 0.5 MG tablet Take 1 tablet (0.5 mg total) by mouth 2 (two) times daily. 04/11/15   Tresa Garter, MD  ondansetron (ZOFRAN) 8 MG tablet Take 8 mg by mouth 2 (two) times daily as needed for nausea or vomiting.     Historical Provider, MD     Objective:   Filed Vitals:   04/11/15 0945  BP: 145/86  Pulse: 76  Temp: 98.2 F (36.8 C)  TempSrc: Oral  Resp: 16  Height: 5' 5"  (1.651 m)  Weight: 209 lb 12.8 oz (95.165 kg)  SpO2: 97%    Exam General appearance : Awake, alert, not in any distress. Speech Clear. Not toxic looking HEENT: Atraumatic and Normocephalic, pupils equally reactive to light and accomodation Neck: supple, no JVD. No cervical lymphadenopathy.  Chest:Good air entry bilaterally, no added sounds, status post breast reconstruction surgery CVS: S1 S2 regular, no murmurs.  Abdomen: Bowel sounds present, Non tender and not distended with no gaurding, rigidity or rebound., Liposuction scar. Extremities: B/L Lower Ext shows no edema, both legs are warm to touch Neurology: Awake alert, and oriented X 3, CN II-XII intact, Non focal Skin:No Rash  Data Review Lab Results  Component Value Date   HGBA1C 5.0 01/29/2014     Assessment & Plan   1. Moderate recurrent major depression With increase Cymbalta to 60 mg capsule by mouth daily - DULoxetine (CYMBALTA) 60 MG capsule; Take 1 capsule (60 mg total) by mouth daily.  Dispense: 30 capsule; Refill: 3 - Patient counseled extensively.  2. Breast cancer of upper-outer quadrant of left female breast with major depression and anxiety Refill - LORazepam (ATIVAN) 0.5 MG tablet; Take 1  tablet (0.5 mg total) by mouth 2 (two) times daily.  Dispense: 30 tablet; Refill: 0 - Patient has been counseled on weaning off Ativan, she agrees to the plan  3. Essential hypertension, benign  Repeat blood pressure today is 130/85 mmHg sitting  - We have discussed target BP range and blood pressure goal - I have advised patient to check BP regularly and to call us back or report to clinic if the numbers are consistently higher than 140/90  - We discussed the importance of compliance with medical therapy and DASH diet recommended, consequences of uncontrolled hypertension discussed.  - continue current BP medications   Return in about 2 months (around 06/11/2015).  The patient was given clear instructions to go to ER or return to medical center if symptoms don't improve, worsen or new problems develop. The patient verbalized understanding. The patient was told to call to get lab results if they haven't heard anything in the next week.   This note  has been created with Surveyor, quantity. Any transcriptional errors are unintentional.    Angelica Chessman, MD, [place], [place], [place], [place] and [place], [place]   04/11/2015, 10:22 AM

## 2015-04-25 ENCOUNTER — Other Ambulatory Visit: Payer: Self-pay | Admitting: Oncology

## 2015-04-26 ENCOUNTER — Other Ambulatory Visit: Payer: Medicaid Other

## 2015-04-26 ENCOUNTER — Ambulatory Visit: Payer: Medicaid Other

## 2015-06-28 ENCOUNTER — Other Ambulatory Visit (HOSPITAL_BASED_OUTPATIENT_CLINIC_OR_DEPARTMENT_OTHER): Payer: Medicaid Other

## 2015-06-28 DIAGNOSIS — C50811 Malignant neoplasm of overlapping sites of right female breast: Secondary | ICD-10-CM

## 2015-06-28 DIAGNOSIS — C50412 Malignant neoplasm of upper-outer quadrant of left female breast: Secondary | ICD-10-CM | POA: Diagnosis not present

## 2015-06-28 LAB — COMPREHENSIVE METABOLIC PANEL (CC13)
ALT: 18 U/L (ref 0–55)
AST: 16 U/L (ref 5–34)
Albumin: 3.7 g/dL (ref 3.5–5.0)
Alkaline Phosphatase: 93 U/L (ref 40–150)
Anion Gap: 7 mEq/L (ref 3–11)
BUN: 16.1 mg/dL (ref 7.0–26.0)
CO2: 24 mEq/L (ref 22–29)
Calcium: 9.2 mg/dL (ref 8.4–10.4)
Chloride: 111 mEq/L — ABNORMAL HIGH (ref 98–109)
Creatinine: 0.8 mg/dL (ref 0.6–1.1)
Glucose: 93 mg/dl (ref 70–140)
Potassium: 3.9 mEq/L (ref 3.5–5.1)
SODIUM: 143 meq/L (ref 136–145)
TOTAL PROTEIN: 6.7 g/dL (ref 6.4–8.3)
Total Bilirubin: 0.31 mg/dL (ref 0.20–1.20)

## 2015-06-28 LAB — CBC WITH DIFFERENTIAL/PLATELET
BASO%: 0.6 % (ref 0.0–2.0)
BASOS ABS: 0 10*3/uL (ref 0.0–0.1)
EOS%: 2.5 % (ref 0.0–7.0)
Eosinophils Absolute: 0.2 10*3/uL (ref 0.0–0.5)
HEMATOCRIT: 36.9 % (ref 34.8–46.6)
HEMOGLOBIN: 12.1 g/dL (ref 11.6–15.9)
LYMPH%: 28.4 % (ref 14.0–49.7)
MCH: 28.6 pg (ref 25.1–34.0)
MCHC: 32.8 g/dL (ref 31.5–36.0)
MCV: 87.2 fL (ref 79.5–101.0)
MONO#: 0.4 10*3/uL (ref 0.1–0.9)
MONO%: 5.7 % (ref 0.0–14.0)
NEUT#: 4.1 10*3/uL (ref 1.5–6.5)
NEUT%: 62.8 % (ref 38.4–76.8)
Platelets: 271 10*3/uL (ref 145–400)
RBC: 4.23 10*6/uL (ref 3.70–5.45)
RDW: 14.1 % (ref 11.2–14.5)
WBC: 6.6 10*3/uL (ref 3.9–10.3)
lymph#: 1.9 10*3/uL (ref 0.9–3.3)

## 2015-07-05 ENCOUNTER — Telehealth: Payer: Self-pay | Admitting: Oncology

## 2015-07-05 ENCOUNTER — Other Ambulatory Visit: Payer: Medicaid Other

## 2015-07-05 ENCOUNTER — Ambulatory Visit (HOSPITAL_BASED_OUTPATIENT_CLINIC_OR_DEPARTMENT_OTHER): Payer: Medicaid Other | Admitting: Nurse Practitioner

## 2015-07-05 ENCOUNTER — Encounter: Payer: Self-pay | Admitting: Nurse Practitioner

## 2015-07-05 VITALS — BP 122/72 | HR 64 | Temp 98.4°F | Resp 18 | Ht 65.0 in | Wt 214.2 lb

## 2015-07-05 DIAGNOSIS — Z803 Family history of malignant neoplasm of breast: Secondary | ICD-10-CM

## 2015-07-05 DIAGNOSIS — C50811 Malignant neoplasm of overlapping sites of right female breast: Secondary | ICD-10-CM | POA: Diagnosis not present

## 2015-07-05 DIAGNOSIS — C50412 Malignant neoplasm of upper-outer quadrant of left female breast: Secondary | ICD-10-CM | POA: Diagnosis present

## 2015-07-05 DIAGNOSIS — Z86718 Personal history of other venous thrombosis and embolism: Secondary | ICD-10-CM | POA: Diagnosis not present

## 2015-07-05 NOTE — Telephone Encounter (Signed)
Appointments made and avs printed for patient,email to [facility]ret[facility] for breast survivorship

## 2015-07-05 NOTE — Progress Notes (Signed)
ID: Su Grand OB: 1971/11/17  MR#: [id number]  CSN#:[id number]  PCP: Angelica Chessman, MD GYN:   SU: Dr. Erroll Luna OTHER MD: Dr. 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 There was also an 8 mm mass 8 cm from the nipple in the ipsilateral breast, as well as positive lymph nodes. 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 MRI confirmed 2.8 and 1.3 cm mass is as well as lymph node. On the right a 6 mm nodule, biopsied and negative for malignancy. PET scan 0-35-00 had hypermetabolic left breast mass consistent with known neoplasm and FDG positive left axillary lymph nodes,benign-appearing brown fat activity and muscular activity in the  neck and chest but no findings for metastatic disease involving the neck, chest, abdomen, pelvis or bones. Moderate FDG activity in the endometrial canal is thought likely due to secretory phase of ovulation or menses. No mass, uterine fibroids present. CT [facility] 02-19-14 had 3 cm left breast mass, enlarged left axillary lymph nodes are positive and no CT findings for metastatic disease involving the chest,abdomen or pelvis and no evidence of osseous metastatic disease. Mildly enlarged fibroid uterus."   Her subsequent treatment is as detailed below   INTERVAL HISTORY:   Marg returns today for follow up of her breast  cancer. She has recently started training with the [employer] program, and is fully enjoying it. She works out for nearly 1 hr daily, and especially enjoys the water aerobics. The interval history is otherwise unremarkable.  REVIEW OF SYSTEMS: Jonah denies fevers, chills, nausea, vomiting, or changes in bowel or bladder habits. She is having some clear sinus drainage, but denies sore throat, congestion, or cough. Her blood pressure has been stable. She denies shortness of breath, chest pain, cough, or palpitations. She continues to have left axillar numbness and shooting pains to the left breast on occasion. She is working with Dr. Migdalia Dk.she denies headaches, dizziness, or vision changes. She has some hot flashes. Her mood has improved since starting and exercise program. A detailed review of systems is otherwise stable.  PAST MEDICAL HISTORY: Past Medical History  Diagnosis Date  . Endometriosis   . Hypertension   . Anemia   . Wears glasses   . Iron deficiency anemia, unspecified 02/25/2014  . BRCA1 positive     c.190T>G (p.Cys64Gly) @ [facility]   . History of blood transfusion     last 9'15. due to chemotherapy.  Marland Kitchen DVT (deep venous thrombosis)     Right Subclavian and IJ.. Lovenox stopped 01-10-15 until after surgery planned 02-08-15.  . Fibroid tumor     02-02-15 remains with abdominal pain and some vaginal bleeding due to fibroids.  . Breast cancer 02/01/14    ER-/PR-/Her2+, still receiving chemo 12/21/14, last Herceptin 02-02-15.Left breat cancer.  . Shortness of breath      when Hemoglobin low, now resolved after transfusions 9'15.  . Rash 02/22/2015  . PONV (postoperative nausea and vomiting)  needs scop patch    PAST SURGICAL HISTORY: Past Surgical History  Procedure Laterality Date  . Cervical polypectomy  2010  . Cesarean section      one previous  . Tubal ligation    . Portacath placement Right 02/23/2014    Procedure: INSERTION PORT-A-CATH;  Surgeon: Joyice Faster. Cornett, MD;   Location: [place];  Service: General;  Laterality: Right;  . Port-a-cath removal N/A 05/21/2014    Procedure: REMOVAL PORT-A-CATH;  Surgeon: Zenovia Jarred, MD;  Location: [place];  Service: General;  Laterality: N/A;  . Tee without cardioversion N/A 05/26/2014    Procedure: TRANSESOPHAGEAL ECHOCARDIOGRAM (TEE);  Surgeon: Larey Dresser, MD;  Location: [facility];  Service: Cardiovascular;  Laterality: N/A;  . Bilateral total mastectomy with axillary lymph node dissection Bilateral 07/28/2014    Procedure: BILATERAL TOTAL MASTECTOMY WITH LEFT AXILLARY SENTINEL LYMPH NODE BIOPSY;  Surgeon: Stark Klein, MD;  Location: [place];  Service: General;  Laterality: Bilateral;  . Breast reconstruction with placement of tissue expander and flex hd (acellular hydrated dermis) Bilateral 07/28/2014    Procedure: BILATERAL BREAST RECONSTRUCTION WITH PLACEMENT OF TISSUE EXPANDER AND FLEX HD (ACELLULAR HYDRATED DERMIS);  Surgeon: Theodoro Kos, DO;  Location: [place];  Service: Plastics;  Laterality: Bilateral;  . Removal of tissue expander and placement of implant Bilateral 10/14/2014    Procedure: REMOVAL OF BILATERAL BREAST  TISSUE EXPANDER  WITH PLASCEMENT OF BILATERAL  BREAST IMPLANTS;  Surgeon: Theodoro Kos, DO;  Location: [place];  Service: Plastics;  Laterality: Bilateral;  . Left heart catheterization with coronary angiogram N/A 09/13/2014    Procedure: LEFT HEART CATHETERIZATION WITH CORONARY ANGIOGRAM;  Surgeon: Jolaine Artist, MD;  Location: [facility] CATH LAB;  Service: Cardiovascular;  Laterality: N/A;  . Cardiac catheterization      10'15- Dr. Sung Amabile  . Robotic assisted total hysterectomy with bilateral salpingo oopherectomy Bilateral 02/08/2015    Procedure: ROBOTIC ASSISTED TOTAL HYSTERECTOMY WITH BILATERAL SALPINGO OOPHORECTOMY; UTERUS WEIGHING GREATER THAN 250 GRAMS;  Surgeon: Everitt Amber, MD;  Location: [facility];  Service: Gynecology;  Laterality: Bilateral;  BRCA 1  GENE MUTATION  . Breast reconstruction Bilateral 03/31/2015    Procedure: BILATERAL BREAST RECONSTRUCTION REVISION WITH BILATERAL LIPOFILLING;  Surgeon: Theodoro Kos, DO;  Location: [place];  Service: Plastics;  Laterality: Bilateral;  . Liposuction with lipofilling Bilateral 03/31/2015    Procedure: LIPOSUCTION WITH LIPOFILLING;  Surgeon: Theodoro Kos, DO;  Location: [place];  Service: Plastics;  Laterality: Bilateral;    FAMILY HISTORY Family History  Problem Relation Age of Onset  . Breast cancer Mother 109    currently 26  . Diabetes Father   . Pancreatic cancer Father 76  . Breast cancer Paternal Aunt 11    currently 99; BRCA1 positive  . Stroke Maternal Grandfather   . Cancer Paternal Aunt     unk. primary; deceased 37s  . Breast cancer Cousin     daughter of unaffected paternal aunt; dx in her 35s  [name] father died from prostate cancer the age of 18. [name] mother was diagnosed with breast cancer the age of 42. [name] father had 5 sisters, 3 of whom were diagnosed with breast cancer, 2 of them before the age of 11. The patient had one brother, no sisters. There is no history of ovarian cancer in the family.  GYNECOLOGIC HISTORY:  Menarche age 37, first live birth age 72, the patient is [place] P5. Her periods  stopped at the time of chemotherapy. 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 Her husband [name] since works as an Animal nutritionist. [name] oldest child, a son, [name], is studying Therapist, occupational; the patient is a 71 year old daughter [name] is also in college. [name] younger children are 24, 41, and 59. The patient attends a Arboricultur[employer] church   ADVANCED DIRECTIVES: Not in place   HEALTH MAINTENANCE: History  Substance Use Topics  . Smoking status: Former Smoker -- 0.25 packs/day for 15 years    Quit date: 02/18/2009  .  Smokeless tobacco: Former Systems developer  . Alcohol Use: No     Comment: occasional   Colonoscopy: Bone Density Scan:  Pap Smear:  Eye Exam:  Vitamin D Level:   Lipid Panel:    Allergies  Allergen Reactions  . Compazine [Prochlorperazine Edisylate] Other (See Comments)    Stuttering    Current Outpatient Prescriptions  Medication Sig Dispense Refill  . carvedilol (COREG) 12.5 MG tablet Take 1.5 tablets (18.75 mg total) by mouth 2 (two) times daily with a meal. 90 tablet 3  . DULoxetine (CYMBALTA) 60 MG capsule Take 1 capsule (60 mg total) by mouth daily. 30 capsule 3  . ferrous sulfate 325 (65 FE) MG tablet Take 1 tablet (325 mg total) by mouth 3 (three) times daily with meals. 270 tablet 3  . hydrALAZINE (APRESOLINE) 50 MG tablet Take 1 tablet (50 mg total) by mouth every 8 (eight) hours. 90 tablet 3  . hydrochlorothiazide (MICROZIDE) 12.5 MG capsule Take 1 capsule (12.5 mg total) by mouth daily. 90 capsule 3  . ibuprofen (ADVIL,MOTRIN) 800 MG tablet Take 800 mg by mouth every 8 (eight) hours as needed (pain).    Marland Kitchen lisinopril (PRINIVIL,ZESTRIL) 40 MG tablet Take 1 tablet (40 mg total) by mouth daily. 90 tablet 3  . acetaminophen (TYLENOL) 325 MG tablet Take 2 tablets (650 mg total) by mouth every 6 (six) hours as needed for mild pain (or Temp > 100). (Patient not taking: Reported on 03/03/2015)    . ipratropium (ATROVENT) 0.03 % nasal spray Place 2 sprays into the nose 2 (two) times daily. PRN congestion (Patient not taking: Reported on 03/07/2015) 30 mL 0  . LORazepam (ATIVAN) 0.5 MG tablet Take 1 tablet (0.5 mg total) by mouth 2 (two) times daily. (Patient not taking: Reported on 07/05/2015) 30 tablet 0  . ondansetron (ZOFRAN) 8 MG tablet Take 8 mg by mouth 2 (two) times daily as needed for nausea or vomiting.     Marland Kitchen oxyCODONE (ROXICODONE) 5 MG immediate release tablet Take 1 tablet (5 mg total) by mouth every 4 (four) hours as needed for moderate pain or severe pain. (Patient not taking: Reported  on 07/05/2015) 40 tablet 0   No current facility-administered medications for this visit.    OBJECTIVE: Young-appearing African American woman  Filed Vitals:   07/05/15 1124  BP: 122/72  Pulse: 64  Temp: 98.4 F (36.9 C)  Resp: 18     Body mass index is 35.64 kg/(m^2).      ECOG FS:2 - Symptomatic, <50% confined to bed  Skin: warm, dry  HEENT: sclerae anicteric, conjunctivae pink, oropharynx clear. No thrush or mucositis.  Lymph Nodes: No cervical or supraclavicular lymphadenopathy  Lungs: clear to auscultation bilaterally, no rales, wheezes, or rhonci  Heart: regular rate and rhythm  Abdomen: round, soft, non tender, positive bowel sounds  Musculoskeletal: No focal  spinal tenderness, no peripheral edema  Neuro: non focal, well oriented, positive affect  Breasts: bilateral breasts status post mastectomies and implant reconstruction. No evidence of recurrent disease. Bilateral axillae benign.   LAB RESULTS:  CMP     Component Value Date/Time   NA 143 06/28/2015 1036   NA 142 03/03/2015 2308   K 3.9 06/28/2015 1036   K 3.8 03/03/2015 2308   CL 106 03/03/2015 2308   CO2 24 06/28/2015 1036   CO2 27 03/03/2015 2308   GLUCOSE 93 06/28/2015 1036   GLUCOSE 100* 03/03/2015 2308   BUN 16.1 06/28/2015 1036   BUN 21 03/03/2015 2308   CREATININE 0.8 06/28/2015 1036   CREATININE 1.02 03/03/2015 2308   CALCIUM 9.2 06/28/2015 1036   CALCIUM 9.6 03/03/2015 2308   PROT 6.7 06/28/2015 1036   PROT 7.2 03/03/2015 2308   ALBUMIN 3.7 06/28/2015 1036   ALBUMIN 4.0 03/03/2015 2308   AST 16 06/28/2015 1036   AST 18 03/03/2015 2308   ALT 18 06/28/2015 1036   ALT 16 03/03/2015 2308   ALKPHOS 93 06/28/2015 1036   ALKPHOS 92 03/03/2015 2308   BILITOT 0.31 06/28/2015 1036   BILITOT 0.3 03/03/2015 2308   GFRNONAA 67* 03/03/2015 2308   GFRAA 78* 03/03/2015 2308    I No results found for: SPEP  Lab Results  Component Value Date   WBC 6.6 06/28/2015   NEUTROABS 4.1 06/28/2015   HGB  12.1 06/28/2015   HCT 36.9 06/28/2015   MCV 87.2 06/28/2015   PLT 271 06/28/2015        Chemistry      Component Value Date/Time   NA 143 06/28/2015 1036   NA 142 03/03/2015 2308   K 3.9 06/28/2015 1036   K 3.8 03/03/2015 2308   CL 106 03/03/2015 2308   CO2 24 06/28/2015 1036   CO2 27 03/03/2015 2308   BUN 16.1 06/28/2015 1036   BUN 21 03/03/2015 2308   CREATININE 0.8 06/28/2015 1036   CREATININE 1.02 03/03/2015 2308      Component Value Date/Time   CALCIUM 9.2 06/28/2015 1036   CALCIUM 9.6 03/03/2015 2308   ALKPHOS 93 06/28/2015 1036   ALKPHOS 92 03/03/2015 2308   AST 16 06/28/2015 1036   AST 18 03/03/2015 2308   ALT 18 06/28/2015 1036   ALT 16 03/03/2015 2308   BILITOT 0.31 06/28/2015 1036   BILITOT 0.3 03/03/2015 2308       No results found for: LABCA2  No components found for: LABCA125  No results for input(s): INR in the last 168 hours.  Urinalysis    Component Value Date/Time   COLORURINE RED* 02/02/2015 1030   APPEARANCEUR CLOUDY* 02/02/2015 1030   LABSPEC 1.025 02/16/2015 1243   LABSPEC 1.024 02/02/2015 1030   PHURINE 6.0 02/16/2015 1243   PHURINE 6.0 02/02/2015 1030   GLUCOSEU Negative 02/16/2015 1243   GLUCOSEU NEGATIVE 02/02/2015 1030   HGBUR Negative 02/16/2015 1243   HGBUR LARGE* 02/02/2015 1030   BILIRUBINUR Negative 02/16/2015 1243   BILIRUBINUR NEGATIVE 02/02/2015 1030   KETONESUR 5 02/16/2015 1243   KETONESUR NEGATIVE 02/02/2015 1030   PROTEINUR < 30 02/16/2015 1243   PROTEINUR 30* 02/02/2015 1030   UROBILINOGEN 0.2 02/16/2015 1243   UROBILINOGEN 0.2 02/02/2015 1030   NITRITE Negative 02/16/2015 1243   NITRITE NEGATIVE 02/02/2015 1030   LEUKOCYTESUR Moderate 02/16/2015 1243   LEUKOCYTESUR SMALL* 02/02/2015 1030    STUDIES: No results found.  Assessment: 44 y.o. BRCA-1 positive [place] woman  1. S/p left breast upper outer quadrant biopsy of two separate breast masses and one axillary lymph node 01/29/2014 for a , clinical  mT2 N1 stage IIB, invasive ductal carcinoma, grade 2-3,  estrogen and progesterone receptor negative, with an MIB-1 between 79-100%, and HER 2 amplified  2. completed 6 cycles of carboplatin, docetaxel, trastuzumab and pertuzumab 06/30/2014 with MRI 07/07/2014 showing a complete radiologic response  3. trastuzumab was to be continued to complete a year (through March 2016); however echocardiogram 07/05/2014 showed an ejection fraction of 45-50%-- trastuzumab was held after 06/30/2014 dose until EF recovery, resumed 11/09/2014  (a) cath report from 09/13/2014 shows normal coronaries and a normal left ventricular function with an ejection fraction of 55%.  (b) echo 03/01/2015 suggest continuing mild cardiomyopathy: Trastuzumab discontinued--final dose 02/22/2015  4. 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   5. MRSA port infection and septicemia mid-June 2015;  Port was removed, and  PICC line placed, completed antibiotic therapy with ANCEF; PICC subsequently pulled   6 genetic testing with the [facility] panel and was found to have a pathogenic mutation in the BRCA1 gene called c.190T>G (p.Cys64Gly  7. status post bilateral mastectomies and left axillary sentinel lymph node biopsy, withimmediate expander placement, on 07/28/14; the pathology ([id number]) showed a complete pathologic response in the left breast and the 3 sentinel lymph nodes sampled; the right breast was benign  (a) bilateral breast reconstruction revision 03/31/2015  8. Bilateral salpingo-oophorectomy and hysterectomy 02/08/15 with benign pathology  PLAN:  Americus is doing well as far as her breast cancer is concerned. The labs were reviewed in detail and were entirely stable. She is encouraged about the [facility] and takes advantage of this time in the gym nearly every day. She is going to ask Dr. Haroldine Laws about participating in the cardio classes such as zumba during  their visit in 2 weeks.   For her mild sinus drainage, I suggested she try an OTC antihistamine such as claritin.   Francisca December will continue to follow up with Dr. Migdalia Dk, who has an additional fat transfer in mind. She will return in 3 months to follow up with the survivorship NP, then alternate with Dr. Jana Hakim again in 3 months. She understands and agrees with this plan. She has been encouraged to call with any issues that might arise before her next visit here.    Laurie Panda, NP  07/05/2015 12:29 PM

## 2015-07-08 ENCOUNTER — Telehealth: Payer: Self-pay

## 2015-07-08 NOTE — Telephone Encounter (Signed)
Patient had cancer and got [facility] Medicaid and therefore did not continue in [facility].

## 2015-07-10 ENCOUNTER — Emergency Department (HOSPITAL_COMMUNITY)
Admission: EM | Admit: 2015-07-10 | Discharge: 2015-07-10 | Disposition: A | Discharge status: Home / Self Care | Payer: Medicaid Other | Attending: Emergency Medicine | Admitting: Emergency Medicine

## 2015-07-10 ENCOUNTER — Encounter (HOSPITAL_COMMUNITY): Payer: Self-pay

## 2015-07-10 DIAGNOSIS — Y9389 Activity, other specified: Secondary | ICD-10-CM | POA: Insufficient documentation

## 2015-07-10 DIAGNOSIS — Z86018 Personal history of other benign neoplasm: Secondary | ICD-10-CM | POA: Diagnosis not present

## 2015-07-10 DIAGNOSIS — W57XXXA Bitten or stung by nonvenomous insect and other nonvenomous arthropods, initial encounter: Secondary | ICD-10-CM | POA: Insufficient documentation

## 2015-07-10 DIAGNOSIS — D649 Anemia, unspecified: Secondary | ICD-10-CM | POA: Insufficient documentation

## 2015-07-10 DIAGNOSIS — Z8742 Personal history of other diseases of the female genital tract: Secondary | ICD-10-CM | POA: Diagnosis not present

## 2015-07-10 DIAGNOSIS — Z7952 Long term (current) use of systemic steroids: Secondary | ICD-10-CM | POA: Diagnosis not present

## 2015-07-10 DIAGNOSIS — Z86718 Personal history of other venous thrombosis and embolism: Secondary | ICD-10-CM | POA: Diagnosis not present

## 2015-07-10 DIAGNOSIS — I10 Essential (primary) hypertension: Secondary | ICD-10-CM | POA: Insufficient documentation

## 2015-07-10 DIAGNOSIS — Z87891 Personal history of nicotine dependence: Secondary | ICD-10-CM | POA: Diagnosis not present

## 2015-07-10 DIAGNOSIS — Z853 Personal history of malignant neoplasm of breast: Secondary | ICD-10-CM | POA: Diagnosis not present

## 2015-07-10 DIAGNOSIS — R21 Rash and other nonspecific skin eruption: Secondary | ICD-10-CM | POA: Diagnosis present

## 2015-07-10 DIAGNOSIS — Y998 Other external cause status: Secondary | ICD-10-CM | POA: Diagnosis not present

## 2015-07-10 DIAGNOSIS — Z79899 Other long term (current) drug therapy: Secondary | ICD-10-CM | POA: Insufficient documentation

## 2015-07-10 DIAGNOSIS — Y9289 Other specified places as the place of occurrence of the external cause: Secondary | ICD-10-CM | POA: Diagnosis not present

## 2015-07-10 DIAGNOSIS — S90862A Insect bite (nonvenomous), left foot, initial encounter: Secondary | ICD-10-CM | POA: Insufficient documentation

## 2015-07-10 MED ORDER — DIPHENHYDRAMINE HCL 25 MG PO TABS: 25.0000 mg | ORAL_TABLET | Freq: Four times a day (QID) | ORAL | Status: DC

## 2015-07-10 MED ORDER — FAMOTIDINE 20 MG PO TABS
20.0000 mg | ORAL_TABLET | Freq: Once | ORAL | Status: AC
  Administered 2015-07-10: 20 mg via ORAL
  Filled 2015-07-10: qty 1

## 2015-07-10 MED ORDER — DIPHENHYDRAMINE HCL 25 MG PO CAPS
25.0000 mg | ORAL_CAPSULE | Freq: Once | ORAL | Status: AC
  Administered 2015-07-10: 25 mg via ORAL
  Filled 2015-07-10: qty 1

## 2015-07-10 MED ORDER — TRIAMCINOLONE ACETONIDE 0.025 % EX CREA: 1.0000 "application " | TOPICAL_CREAM | Freq: Two times a day (BID) | CUTANEOUS | Status: DC

## 2015-07-10 NOTE — Discharge Instructions (Signed)

## 2015-07-10 NOTE — ED Notes (Addendum)
Pt c/o insect bite on third digit of L foot x last night and LUE rash x 2-3 days.  Denies pain.  Pt reports the rash itches.  Denies new soaps, lotions, etc.  Pt is concerned because she has lymphodema and sts she has to have antibiotics when she has a bite.  Hx of breast CA.

## 2015-07-10 NOTE — ED Notes (Signed)
MD at bedside. 

## 2015-07-10 NOTE — ED Provider Notes (Signed)
CSN: [id number]     Arrival date & time 07/10/15  0844 History   First MD Initiated Contact with Patient 07/10/15 [phone]     Chief Complaint  Patient presents with  . Insect Bite  . Rash   (Consider location/radiation/quality/duration/timing/severity/associated sxs/prior Treatment) HPI   Patient is a 44 year old female presents to the ER with a possible insect bite to the left foot between toe number 2 and 3 that was noticed when she woke up this morning, there is a small bump that is itchy with some mild surrounding redness. She does not know what bit her and she denies any pain.  She has not tried anything for it, and does not identify any aggravating factors.  She additionally is concerned with a rash on her left arm, which she has been trying not to itch for some time.  She thinks some of this rash has also gone to her chest.  She denies any new lotions or detergents. Denies any new foods or medicines. She has not had any shortness of breath, wheeze, facial swelling, sensation of throat tightening, difficulty swallowing or speaking. She denies any fever, nausea, vomiting.   Past Medical History  Diagnosis Date  . Endometriosis   . Hypertension   . Anemia   . Wears glasses   . Iron deficiency anemia, unspecified 02/25/2014  . BRCA1 positive     c.190T>G (p.Cys64Gly) @ [facility]   . History of blood transfusion     last 9'15. due to chemotherapy.  Marland Kitchen DVT (deep venous thrombosis)     Right Subclavian and IJ.. Lovenox stopped 01-10-15 until after surgery planned 02-08-15.  . Fibroid tumor     02-02-15 remains with abdominal pain and some vaginal bleeding due to fibroids.  . Breast cancer 02/01/14    ER-/PR-/Her2+, still receiving chemo 12/21/14, last Herceptin 02-02-15.Left breat cancer.  . Shortness of breath      when Hemoglobin low, now resolved after transfusions 9'15.  . Rash 02/22/2015  . PONV (postoperative nausea and vomiting)     needs scop patch   Past Surgical History  Procedure  Laterality Date  . Cervical polypectomy  2010  . Cesarean section      one previous  . Tubal ligation    . Portacath placement Right 02/23/2014    Procedure: INSERTION PORT-A-CATH;  Surgeon: Joyice Faster. Cornett, MD;  Location: [place];  Service: General;  Laterality: Right;  . Port-a-cath removal N/A 05/21/2014    Procedure: REMOVAL PORT-A-CATH;  Surgeon: Zenovia Jarred, MD;  Location: [place];  Service: General;  Laterality: N/A;  . Tee without cardioversion N/A 05/26/2014    Procedure: TRANSESOPHAGEAL ECHOCARDIOGRAM (TEE);  Surgeon: Larey Dresser, MD;  Location: [facility] ENDOSCOPY;  Service: Cardiovascular;  Laterality: N/A;  . Bilateral total mastectomy with axillary lymph node dissection Bilateral 07/28/2014    Procedure: BILATERAL TOTAL MASTECTOMY WITH LEFT AXILLARY SENTINEL LYMPH NODE BIOPSY;  Surgeon: Stark Klein, MD;  Location: [place];  Service: General;  Laterality: Bilateral;  . Breast reconstruction with placement of tissue expander and flex hd (acellular hydrated dermis) Bilateral 07/28/2014    Procedure: BILATERAL BREAST RECONSTRUCTION WITH PLACEMENT OF TISSUE EXPANDER AND FLEX HD (ACELLULAR HYDRATED DERMIS);  Surgeon: Theodoro Kos, DO;  Location: [place];  Service: Plastics;  Laterality: Bilateral;  . Removal of tissue expander and placement of implant Bilateral 10/14/2014    Procedure: REMOVAL OF BILATERAL BREAST  TISSUE EXPANDER  WITH PLASCEMENT OF BILATERAL  BREAST IMPLANTS;  Surgeon: Lyndee Leo  Sanger, DO;  Location: [place];  Service: Clinical cytogeneticist;  Laterality: Bilateral;  . Left heart catheterization with coronary angiogram N/A 09/13/2014    Procedure: LEFT HEART CATHETERIZATION WITH CORONARY ANGIOGRAM;  Surgeon: Jolaine Artist, MD;  Location: [facility] CATH LAB;  Service: Cardiovascular;  Laterality: N/A;  . Cardiac catheterization      10'15- Dr. Sung Amabile  . Robotic assisted total hysterectomy with bilateral salpingo oopherectomy Bilateral 02/08/2015     Procedure: ROBOTIC ASSISTED TOTAL HYSTERECTOMY WITH BILATERAL SALPINGO OOPHORECTOMY; UTERUS WEIGHING GREATER THAN 250 GRAMS;  Surgeon: Everitt Amber, MD;  Location: [facility];  Service: Gynecology;  Laterality: Bilateral;  BRCA 1 GENE MUTATION  . Breast reconstruction Bilateral 03/31/2015    Procedure: BILATERAL BREAST RECONSTRUCTION REVISION WITH BILATERAL LIPOFILLING;  Surgeon: Theodoro Kos, DO;  Location: [place];  Service: Plastics;  Laterality: Bilateral;  . Liposuction with lipofilling Bilateral 03/31/2015    Procedure: LIPOSUCTION WITH LIPOFILLING;  Surgeon: Theodoro Kos, DO;  Location: [place];  Service: Plastics;  Laterality: Bilateral;   Family History  Problem Relation Age of Onset  . Breast cancer Mother 57    currently 32  . Diabetes Father   . Pancreatic cancer Father 39  . Breast cancer Paternal Aunt 61    currently 58; BRCA1 positive  . Stroke Maternal Grandfather   . Cancer Paternal Aunt     unk. primary; deceased 27s  . Breast cancer Cousin     daughter of unaffected paternal aunt; dx in her 63s   History  Substance Use Topics  . Smoking status: Former Smoker -- 0.25 packs/day for 15 years    Quit date: 02/18/2009  . Smokeless tobacco: Former Systems developer  . Alcohol Use: No     Comment: occasional   OB History    Gravida Para Term Preterm AB TAB SAB Ectopic Multiple Living   _0 Review of Systems  Constitutional: Negative.   HENT: Negative.   Respiratory: Negative.   Cardiovascular: Negative.   Gastrointestinal: Negative.   Genitourinary: Negative.   Musculoskeletal: Negative.   Skin: Positive for rash. Negative for color change, pallor and wound.  Neurological: Negative.   Psychiatric/Behavioral: Negative.       Allergies  Compazine  Home Medications   Prior to Admission medications   Medication Sig Start Date End Date Taking? Authorizing Provider  carvedilol (COREG) 12.5 MG tablet Take 1.5 tablets (18.75 mg  total) by mouth 2 (two) times daily with a meal. 03/01/15  Yes Jolaine Artist, MD  DULoxetine (CYMBALTA) 60 MG capsule Take 1 capsule (60 mg total) by mouth daily. 04/11/15  Yes Tresa Garter, MD  ferrous sulfate 325 (65 FE) MG tablet Take 1 tablet (325 mg total) by mouth 3 (three) times daily with meals. 04/11/15  Yes Tresa Garter, MD  hydrALAZINE (APRESOLINE) 50 MG tablet Take 1 tablet (50 mg total) by mouth every 8 (eight) hours. 03/01/15  Yes Jolaine Artist, MD  hydrochlorothiazide (MICROZIDE) 12.5 MG capsule Take 1 capsule (12.5 mg total) by mouth daily. 03/01/15  Yes Jolaine Artist, MD  ibuprofen (ADVIL,MOTRIN) 800 MG tablet Take 800 mg by mouth every 8 (eight) hours as needed (pain).   Yes Historical Provider, MD  lisinopril (PRINIVIL,ZESTRIL) 40 MG tablet Take 1 tablet (40 mg total) by mouth daily. 03/01/15  Yes Shaune Pascal Bensimhon, MD  ondansetron (ZOFRAN) 8 MG tablet Take 8 mg by mouth  2 (two) times daily as needed for nausea or vomiting.    Yes Historical Provider, MD  acetaminophen (TYLENOL) 325 MG tablet Take 2 tablets (650 mg total) by mouth every 6 (six) hours as needed for mild pain (or Temp > 100). Patient not taking: Reported on 03/03/2015 07/30/14   Shawn M Rayburn, PA-C  diphenhydrAMINE (BENADRYL) 25 MG tablet Take 1 tablet (25 mg total) by mouth every 6 (six) hours. 07/10/15   Delsa Grana, PA-C  ipratropium (ATROVENT) 0.03 % nasal spray Place 2 sprays into the nose 2 (two) times daily. PRN congestion Patient not taking: Reported on 03/07/2015 09/01/14   Jarrett Soho Muthersbaugh, PA-C  LORazepam (ATIVAN) 0.5 MG tablet Take 1 tablet (0.5 mg total) by mouth 2 (two) times daily. Patient not taking: Reported on 07/05/2015 04/11/15   Tresa Garter, MD  oxyCODONE (ROXICODONE) 5 MG immediate release tablet Take 1 tablet (5 mg total) by mouth every 4 (four) hours as needed for moderate pain or severe pain. Patient not taking: Reported on 07/05/2015 03/31/15   Fabio Asa Rayburn, PA-C   PRESCRIPTION MEDICATION Antibody Plan Supportive Therapy - [place]    Historical Provider, MD  triamcinolone (KENALOG) 0.025 % cream Apply 1 application topically 2 (two) times daily. 07/10/15   Delsa Grana, PA-C   BP 158/56 mmHg  Pulse 70  Temp(Src) 98.2 F (36.8 C) (Oral)  Resp 20  SpO2 97%  LMP 02/19/2014 Physical Exam  Constitutional: She is oriented to person, place, and time. She appears well-developed and well-nourished. No distress.  HENT:  Head: Normocephalic and atraumatic.  Right Ear: External ear normal.  Left Ear: External ear normal.  Nose: Nose normal.  Mouth/Throat: Oropharynx is clear and moist. No oropharyngeal exudate.  Eyes: Conjunctivae and EOM are normal. Pupils are equal, round, and reactive to light. Right eye exhibits no discharge. Left eye exhibits no discharge. No scleral icterus.  Neck: Normal range of motion. Neck supple. No JVD present. No tracheal deviation present.  Cardiovascular: Normal rate, regular rhythm, normal heart sounds and intact distal pulses.  Exam reveals no gallop and no friction rub.   No murmur heard. Pulmonary/Chest: Effort normal and breath sounds normal. No stridor. No respiratory distress. She has no wheezes. She has no rales. She exhibits no tenderness.  Musculoskeletal: Normal range of motion. She exhibits no edema.  Lymphadenopathy:    She has no cervical adenopathy.  Neurological: She is alert and oriented to person, place, and time. She exhibits normal muscle tone. Coordination normal.  Skin: Skin is warm and dry. She is not diaphoretic. No pallor.  Single papule located between toe #2&3 over MTP area, approximately .5 cm in diameter, with mild erythema, skin intact, no drainage, no surrounding erythema or induration, no fluctuance. Hyperpigmented confluent patches to left anterior arm roughly 10x10cm, no erythema, no exudate Scant hyperpigmented patches on chest  Psychiatric: She has a normal mood and affect. Her behavior is  normal. Judgment and thought content normal.    ED Course  Procedures (including critical care time) Labs Review Labs Reviewed - No data to display  Imaging Review No results found.   EKG Interpretation None      MDM   Final diagnoses:  Insect bite  Rash    Insect bite on left foot and rash to left arm and chest, rash appears to be old with some lichenification, no broken skin, nothing worrisome for secondary infection. Patient denies any difficulty breathing or swallowing.  Pt has a patent airway without stridor  and is handling secretions without difficulty; no angioedema. No blisters, no pustules, no warmth, no draining sinus tracts, no superficial abscesses, no bullous impetigo, no vesicles, no desquamation, no target lesions with dusky purpura or a central bulla. Not tender to touch. No concern for superimposed infection. No concern for SJS, TEN, TSS, tick borne illness, syphilis or other life-threatening condition. Will discharge home with topical steroids, pepcid and recommend Benadryl as needed for pruritis.      Delsa Grana, PA-C 07/10/15 V[place], DO 07/10/15 1850

## 2015-07-20 ENCOUNTER — Other Ambulatory Visit: Payer: Self-pay | Admitting: Oncology

## 2015-07-20 ENCOUNTER — Ambulatory Visit (HOSPITAL_BASED_OUTPATIENT_CLINIC_OR_DEPARTMENT_OTHER)
Admission: RE | Admit: 2015-07-20 | Discharge: 2015-07-20 | Disposition: A | Discharge status: Home / Self Care | Payer: Medicaid Other | Source: Ambulatory Visit | Attending: Internal Medicine | Admitting: Internal Medicine

## 2015-07-20 ENCOUNTER — Ambulatory Visit (HOSPITAL_COMMUNITY)
Admission: RE | Admit: 2015-07-20 | Discharge: 2015-07-20 | Disposition: A | Discharge status: Home / Self Care | Payer: Medicaid Other | Source: Ambulatory Visit | Attending: Internal Medicine | Admitting: Internal Medicine

## 2015-07-20 ENCOUNTER — Encounter (HOSPITAL_COMMUNITY): Payer: Self-pay

## 2015-07-20 VITALS — BP 112/70 | HR 61 | Wt 215.2 lb

## 2015-07-20 DIAGNOSIS — C50919 Malignant neoplasm of unspecified site of unspecified female breast: Secondary | ICD-10-CM | POA: Diagnosis not present

## 2015-07-20 DIAGNOSIS — I429 Cardiomyopathy, unspecified: Secondary | ICD-10-CM | POA: Diagnosis not present

## 2015-07-20 DIAGNOSIS — I427 Cardiomyopathy due to drug and external agent: Secondary | ICD-10-CM

## 2015-07-20 DIAGNOSIS — I517 Cardiomegaly: Secondary | ICD-10-CM | POA: Insufficient documentation

## 2015-07-20 DIAGNOSIS — T451X5A Adverse effect of antineoplastic and immunosuppressive drugs, initial encounter: Secondary | ICD-10-CM

## 2015-07-20 DIAGNOSIS — I10 Essential (primary) hypertension: Secondary | ICD-10-CM

## 2015-07-20 DIAGNOSIS — C50412 Malignant neoplasm of upper-outer quadrant of left female breast: Secondary | ICD-10-CM

## 2015-07-20 NOTE — Patient Instructions (Signed)
FOLLOW UP in 6 weeks with an ECHO.  

## 2015-07-20 NOTE — Addendum Note (Signed)
Encounter addended by: Harvie Junior, CMA on: 07/20/2015  1:21 PM<BR>     Documentation filed: Visit Diagnoses, Dx Association, Patient Instructions Section, Orders

## 2015-07-20 NOTE — Progress Notes (Unsigned)
Was contacted by Dr. Haroldine Laws regarding improvement in heart function. Called Pricilla Handler. She would like to resume the treatments and I have set her up for August 25 as her nextHerceptin.

## 2015-07-20 NOTE — Progress Notes (Signed)
  Echocardiogram 2D Echocardiogram has been performed.  Diamond Nickel 07/20/2015, 12:14 PM

## 2015-07-20 NOTE — Progress Notes (Signed)
Patient ID: Tamara [name], female   DOB: June 12, 1971, 44 y.o.   MRN: [id number] Referring Physician: Dr. Jana Hakim Primary Care: [place] Primary Cardiologist: N/A Plastic Surgeon: Dr Migdalia Dk  HPI: Tamara [name] is a 44 yo with a history of HTN and chronic anemia. She was diagnosed with L breast cancer in 3/15. 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 Lymph node was positive for metastatic disease.She is S/P Bilateral Mastectomy with reconstruction 09/02/2014.   Was treated with Taxotere and carboplatinum + Herceptin/perjeta x 6 cycles - stopped in 7/15 due to low EF.   Admitted with MSSA bacteremia due to port infection. Completed IV abx. TEE in 6/15 with EF 50-55%. 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   She retturns for follow up. Has had no herceptin since March. Denies SOB/PND/Orthopnea. Exercising at [employer]. Taking all medicaitons. Wants to start Herceptin back.   Labs (10/15): K 3.9, creatinine 0.9 Labs 02/22/2015: K 3.9 Creatinine 0.8  LHC 09/2014 Coronaries OK   [facility] 12/2014: EF 56% No wall abnormality noted. RV ok. No infiltrative disease noted.   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 + inferior lateral wall aneurysm. Small pericardial effusion  ECHO 11/15: EF 55%, basal anterolateral hypokinesis (basal inferolateral wall not well-visualized), lateral S' 13.6, images not adequate to assess strain.  ECHO 03/01/2015: EF 45% lateral s' -9.1 GLS -19.6 ECHO 07/20/15: EF 50% lateral s' 10.5 GLS -14.5 (poor tracking)  SH: Does not smoke or drink  FH: Mother diagnosed  breast CA at age 73, living        2 paternal aunts - breast cancer (1 deceased and 1 living)        Father- Deceased at age 20 of cancer not sure what kind        - Has 42 yo, 42 yo, 41 yo, 42 yo and 42 yo (3 boys and 2 girls)  ROS: All systems reviewed and negative except as per HPI.    Past Medical History  Diagnosis Date  . Endometriosis   . Hypertension   . Anemia   . Wears glasses   . Iron deficiency anemia, unspecified 02/25/2014  . BRCA1 positive     c.190T>G (p.Cys64Gly) @ [facility]   . History of blood transfusion     last 9'15. due to chemotherapy.  Marland Kitchen DVT (deep venous thrombosis)     Right Subclavian and IJ.. Lovenox stopped 01-10-15 until after surgery planned 02-08-15.  . Fibroid tumor     02-02-15 remains with abdominal pain and some vaginal bleeding due to fibroids.  . Breast cancer 02/01/14    ER-/PR-/Her2+, still receiving chemo 12/21/14, last Herceptin 02-02-15.Left breat cancer.  . Shortness of breath      when Hemoglobin low, now resolved after transfusions 9'15.  . Rash 02/22/2015  . PONV (postoperative nausea and vomiting)     needs scop patch    Current Outpatient Prescriptions  Medication Sig Dispense Refill  . acetaminophen (TYLENOL) 325 MG tablet Take 2 tablets (650 mg total) by mouth every 6 (six) hours as needed  for mild pain (or Temp > 100).    . Biotin 5000 MCG TABS Take 10,000 mcg by mouth daily.    . carvedilol (COREG) 12.5 MG tablet Take 1.5 tablets (18.75 mg total) by mouth 2 (two) times daily with a meal. 90 tablet 3  . diphenhydrAMINE (BENADRYL) 25 MG tablet Take 1 tablet (25 mg total) by mouth every 6 (six) hours. 20 tablet 0  . DULoxetine (CYMBALTA) 60 MG capsule Take 1 capsule (60 mg total) by mouth daily. 30 capsule 3  . ferrous sulfate 325 (65 FE) MG tablet Take 1 tablet (325 mg total) by mouth 3 (three) times daily with meals. 270 tablet 3  . hydrALAZINE (APRESOLINE) 50 MG tablet Take 1 tablet (50 mg total) by mouth every 8 (eight) hours. 90 tablet 3   . hydrochlorothiazide (MICROZIDE) 12.5 MG capsule Take 1 capsule (12.5 mg total) by mouth daily. 90 capsule 3  . ibuprofen (ADVIL,MOTRIN) 800 MG tablet Take 800 mg by mouth every 8 (eight) hours as needed (pain).    Marland Kitchen lisinopril (PRINIVIL,ZESTRIL) 40 MG tablet Take 1 tablet (40 mg total) by mouth daily. 90 tablet 3  . LORazepam (ATIVAN) 0.5 MG tablet Take 0.5 mg by mouth every 8 (eight) hours as needed for anxiety or sleep.    Marland Kitchen ondansetron (ZOFRAN) 8 MG tablet Take 8 mg by mouth 2 (two) times daily as needed for nausea or vomiting.     Marland Kitchen PRESCRIPTION MEDICATION Antibody Plan Supportive Therapy - [facility]    . triamcinolone (KENALOG) 0.025 % cream Apply 1 application topically 2 (two) times daily. 30 g 0   No current facility-administered medications for this encounter.    Allergies  Allergen Reactions  . Compazine [Prochlorperazine Edisylate] Other (See Comments)    Stuttering     Filed Vitals:   07/20/15 1236  BP: 112/70  Pulse: 61  Weight: 215 lb 4 oz (97.637 kg)  SpO2: 98%   PHYSICAL EXAM: General:  Well appearing. No respiratory difficulty HEENT: normal Neck: supple. no JVD. Carotids 2+ bilat; no bruits. No lymphadenopathy or thryomegaly appreciated. Cor: PMI nondisplaced. Regular rate & rhythm. No rubs, gallops 2/6 TR  Lungs: clear Abdomen: soft, nontender, nondistended. No hepatosplenomegaly. No bruits or masses. Good bowel sounds. Extremities: no cyanosis, clubbing, rash, edema  Neuro: alert & oriented x 3, cranial nerves grossly intact. moves all 4 extremities w/o difficulty. Affect pleasant.  ASSESSMENT & PLAN:  1) Cardiomyopathy:  This seems to have pre-dated Herceptin use. Herceptin stopped in March 2016 due to chemotoxicity. Echo today reviewed personally and EF back to normal. She is on good therapy with carvedilol and lisinopril. - I feel strongly that given the benefits of herceptin and the reversible nature of its cardiotoxicity that we should resume therapy at  this point with close monitoring of her heart function. Will discuss with Dr. Jana Hakim. We will repeat echo after 2 more cycles of herceptin.  01/2015 C-Mri was ok no infiltrative disease or wall abnormality.   - Continue Coreg to 18.75  mg bid. - Continue lisinopril at 40 mg daily  - 2) Breast Cancer L:  HER-2/neu positive with a proliferation marker Ki-67 79%.  Back on herceptin. Will need to stop due to recurrent cardiomyopathy.  4) HTN -Blood pressure well controlled. Continue current regimen. 5) DVT -Finished treatment with lovenox 6) Pericardial effusion  No significant effusion on today's echo. 7) CP - post-mastectomy pain. Cardiac cath with normal coronaries.     Satira Mccallum Tillery PA-C 07/20/2015  [facility]     Patient seen and examined with Oda Kilts, PA-C. We discussed all aspects of the encounter. I agree with the assessment and plan as stated above.   I feel strongly that given the benefits of herceptin and the reversible nature of its cardiotoxicity that we should resume therapy at this point with close monitoring of her heart function. Will discuss with Dr. Jana Hakim. We will repeat echo after 2 more cycles of herceptin.   Total time spent 45 minutes. Over half that time spent discussing above.    Nuriyah Hanline,MD 1:15 PM

## 2015-07-20 NOTE — Progress Notes (Signed)
Advanced Heart Failure Medication Review by a Pharmacist  Does the patient  feel that his/her medications are working for him/her?  yes  Has the patient been experiencing any side effects to the medications prescribed?  no  Does the patient measure his/her own blood pressure or blood glucose at home?  no   Does the patient have any problems obtaining medications due to transportation or finances?   no  Understanding of regimen: good Understanding of indications: good Potential of compliance: good    Pharmacist comments:  Tamara [name] is a pleasant 44 yo F who presents without a medication list but who is able to verbalize each of her medications and dosages to me. She states that she misses one of her ferrous sulfate doses ~1-2 times per week but otherwise rarely misses any doses of her medications. She does report chest pain ~1-2 times per week for which she takes Tylenol which alleviates some of her pain but she is not sure if the pain is due to her mastectomy or her heart. She also reports more frequent bowel movements which are formed but do not seem to correlate with any of her medications. She has recently started juicing and eating more salads which could be the cause. No other medication-related questions or concerns identified at this time.   [name]. Velva Harman, PharmD, BCPS, CPP Clinical Pharmacist Pager: [phone] Phone: [phone] 07/20/2015 1:08 PM

## 2015-07-25 ENCOUNTER — Telehealth: Payer: Self-pay | Admitting: Oncology

## 2015-07-25 NOTE — Telephone Encounter (Signed)
Returned Advertising account executive. Patient moved appointment with hercp to start Aug 26, 2023 due to death in family. Patient confirmed and will pick up schedule with remaining appointment then.

## 2015-07-28 ENCOUNTER — Other Ambulatory Visit: Payer: Medicaid Other

## 2015-08-01 ENCOUNTER — Ambulatory Visit: Payer: Medicaid Other | Attending: Internal Medicine | Admitting: *Deleted

## 2015-08-01 DIAGNOSIS — Z111 Encounter for screening for respiratory tuberculosis: Secondary | ICD-10-CM | POA: Diagnosis not present

## 2015-08-01 DIAGNOSIS — Z0000 Encounter for general adult medical examination without abnormal findings: Secondary | ICD-10-CM | POA: Insufficient documentation

## 2015-08-01 NOTE — Progress Notes (Signed)
Patient presents for PPD placement for work Denies previous positive TB test  Denies known exposure to TB   Tuberculin skin test applied to left ventral forearm.  Patient aware that she needs to return in 48 hours for PPD reading   

## 2015-08-02 ENCOUNTER — Encounter: Payer: Self-pay | Admitting: *Deleted

## 2015-08-03 ENCOUNTER — Ambulatory Visit: Payer: Medicaid Other | Attending: Internal Medicine | Admitting: *Deleted

## 2015-08-03 DIAGNOSIS — Z0000 Encounter for general adult medical examination without abnormal findings: Secondary | ICD-10-CM

## 2015-08-03 LAB — TB SKIN TEST
Induration: 0 mm
TB Skin Test: NEGATIVE

## 2015-08-03 NOTE — Progress Notes (Signed)
PPD Reading Note PPD read and results entered in Epic Result: 0 mm induration. Interpretation: Negative Allergic reaction: None Letter provided for employer

## 2015-08-04 ENCOUNTER — Other Ambulatory Visit (HOSPITAL_BASED_OUTPATIENT_CLINIC_OR_DEPARTMENT_OTHER): Payer: Medicaid Other

## 2015-08-04 ENCOUNTER — Ambulatory Visit (HOSPITAL_BASED_OUTPATIENT_CLINIC_OR_DEPARTMENT_OTHER): Payer: Medicaid Other

## 2015-08-04 VITALS — BP 127/67 | HR 63 | Temp 98.1°F | Resp 18

## 2015-08-04 DIAGNOSIS — C50412 Malignant neoplasm of upper-outer quadrant of left female breast: Secondary | ICD-10-CM | POA: Diagnosis present

## 2015-08-04 DIAGNOSIS — C50811 Malignant neoplasm of overlapping sites of right female breast: Secondary | ICD-10-CM

## 2015-08-04 DIAGNOSIS — Z5112 Encounter for antineoplastic immunotherapy: Secondary | ICD-10-CM | POA: Diagnosis not present

## 2015-08-04 LAB — COMPREHENSIVE METABOLIC PANEL (CC13)
ALT: 15 U/L (ref 0–55)
ANION GAP: 7 meq/L (ref 3–11)
AST: 14 U/L (ref 5–34)
Albumin: 3.9 g/dL (ref 3.5–5.0)
Alkaline Phosphatase: 101 U/L (ref 40–150)
BUN: 16.4 mg/dL (ref 7.0–26.0)
CHLORIDE: 109 meq/L (ref 98–109)
CO2: 26 mEq/L (ref 22–29)
Calcium: 9.6 mg/dL (ref 8.4–10.4)
Creatinine: 1.2 mg/dL — ABNORMAL HIGH (ref 0.6–1.1)
EGFR: 68 mL/min/{1.73_m2} — ABNORMAL LOW (ref >90)
Glucose: 100 mg/dl (ref 70–140)
POTASSIUM: 3.8 meq/L (ref 3.5–5.1)
Sodium: 142 mEq/L (ref 136–145)
Total Bilirubin: 0.3 mg/dL (ref 0.20–1.20)
Total Protein: 7.2 g/dL (ref 6.4–8.3)

## 2015-08-04 LAB — CBC WITH DIFFERENTIAL/PLATELET
BASO%: 0.2 % (ref 0.0–2.0)
BASOS ABS: 0 10*3/uL (ref 0.0–0.1)
EOS%: 3.2 % (ref 0.0–7.0)
Eosinophils Absolute: 0.3 10*3/uL (ref 0.0–0.5)
HCT: 40.2 % (ref 34.8–46.6)
HGB: 13.3 g/dL (ref 11.6–15.9)
LYMPH#: 2.4 10*3/uL (ref 0.9–3.3)
LYMPH%: 25.4 % (ref 14.0–49.7)
MCH: 29.3 pg (ref 25.1–34.0)
MCHC: 33.1 g/dL (ref 31.5–36.0)
MCV: 88.5 fL (ref 79.5–101.0)
MONO#: 0.5 10*3/uL (ref 0.1–0.9)
MONO%: 5.6 % (ref 0.0–14.0)
NEUT#: 6.3 10*3/uL (ref 1.5–6.5)
NEUT%: 65.6 % (ref 38.4–76.8)
PLATELETS: 313 10*3/uL (ref 145–400)
RBC: 4.54 10*6/uL (ref 3.70–5.45)
RDW: 12.8 % (ref 11.2–14.5)
WBC: 9.6 10*3/uL (ref 3.9–10.3)

## 2015-08-04 MED ORDER — SODIUM CHLORIDE 0.9 % IV SOLN
6.0000 mg/kg | Freq: Once | INTRAVENOUS | Status: AC
  Administered 2015-08-04: 567 mg via INTRAVENOUS
  Filled 2015-08-04: qty 27

## 2015-08-04 MED ORDER — ACETAMINOPHEN 325 MG PO TABS
ORAL_TABLET | ORAL | Status: AC
  Filled 2015-08-04: qty 2

## 2015-08-04 MED ORDER — SODIUM CHLORIDE 0.9 % IV SOLN
Freq: Once | INTRAVENOUS | Status: AC
  Administered 2015-08-04: 15:00:00 via INTRAVENOUS

## 2015-08-04 MED ORDER — DIPHENHYDRAMINE HCL 25 MG PO CAPS
ORAL_CAPSULE | ORAL | Status: AC
  Filled 2015-08-04: qty 1

## 2015-08-04 MED ORDER — DIPHENHYDRAMINE HCL 25 MG PO CAPS
25.0000 mg | ORAL_CAPSULE | Freq: Once | ORAL | Status: AC
  Administered 2015-08-04: 25 mg via ORAL

## 2015-08-04 MED ORDER — ACETAMINOPHEN 325 MG PO TABS
650.0000 mg | ORAL_TABLET | Freq: Once | ORAL | Status: AC
  Administered 2015-08-04: 650 mg via ORAL

## 2015-08-04 NOTE — Patient Instructions (Signed)
[facility] Discharge Instructions for Patients  Today you received the following: Herceptin   To help prevent nausea and vomiting after your treatment, we encourage you to take your nausea medication as directed.   If you develop nausea and vomiting that is not controlled by your nausea medication, call the clinic.   BELOW ARE SYMPTOMS THAT SHOULD BE REPORTED IMMEDIATELY:  *FEVER GREATER THAN 100.5 F  *CHILLS WITH OR WITHOUT FEVER  NAUSEA AND VOMITING THAT IS NOT CONTROLLED WITH YOUR NAUSEA MEDICATION  *UNUSUAL SHORTNESS OF BREATH  *UNUSUAL BRUISING OR BLEEDING  TENDERNESS IN MOUTH AND THROAT WITH OR WITHOUT PRESENCE OF ULCERS  *URINARY PROBLEMS  *BOWEL PROBLEMS  UNUSUAL RASH Items with * indicate a potential emergency and should be followed up as soon as possible.  Feel free to call the clinic you have any questions or concerns. The clinic phone number is [phone].  Please show the CHEMO ALERT CARD at check-in to the Emergency Department and triage nurse.   

## 2015-08-04 NOTE — Progress Notes (Signed)
Okay to treat with echo results from 07/20/15 per Zigmund Daniel, Dr. Virgie Dad nurse.  Pt states that she feels lumps in her lt breast that she does not recall being there after her implants were placed, and would like either Dr. Jana Hakim or Gentry Fitz NP to assess.  Dr. Jana Hakim aware and would like for pt to stop by his office after treatment is complete today (08/04/15) Pt aware and verbalizes understanding.

## 2015-08-15 ENCOUNTER — Encounter (HOSPITAL_COMMUNITY): Payer: Self-pay | Admitting: Emergency Medicine

## 2015-08-15 ENCOUNTER — Emergency Department (HOSPITAL_COMMUNITY): Payer: Medicaid Other

## 2015-08-15 ENCOUNTER — Emergency Department (HOSPITAL_COMMUNITY)
Admission: EM | Admit: 2015-08-15 | Discharge: 2015-08-16 | Disposition: A | Discharge status: Home / Self Care | Payer: Medicaid Other | Attending: Emergency Medicine | Admitting: Emergency Medicine

## 2015-08-15 DIAGNOSIS — I10 Essential (primary) hypertension: Secondary | ICD-10-CM | POA: Diagnosis not present

## 2015-08-15 DIAGNOSIS — Z853 Personal history of malignant neoplasm of breast: Secondary | ICD-10-CM | POA: Diagnosis not present

## 2015-08-15 DIAGNOSIS — Z8679 Personal history of other diseases of the circulatory system: Secondary | ICD-10-CM | POA: Insufficient documentation

## 2015-08-15 DIAGNOSIS — D649 Anemia, unspecified: Secondary | ICD-10-CM | POA: Insufficient documentation

## 2015-08-15 DIAGNOSIS — Z973 Presence of spectacles and contact lenses: Secondary | ICD-10-CM | POA: Diagnosis not present

## 2015-08-15 DIAGNOSIS — R05 Cough: Secondary | ICD-10-CM | POA: Diagnosis not present

## 2015-08-15 DIAGNOSIS — Z87891 Personal history of nicotine dependence: Secondary | ICD-10-CM | POA: Diagnosis not present

## 2015-08-15 DIAGNOSIS — Z86718 Personal history of other venous thrombosis and embolism: Secondary | ICD-10-CM | POA: Insufficient documentation

## 2015-08-15 DIAGNOSIS — R0789 Other chest pain: Secondary | ICD-10-CM | POA: Insufficient documentation

## 2015-08-15 DIAGNOSIS — Z86018 Personal history of other benign neoplasm: Secondary | ICD-10-CM | POA: Insufficient documentation

## 2015-08-15 DIAGNOSIS — Z79899 Other long term (current) drug therapy: Secondary | ICD-10-CM | POA: Insufficient documentation

## 2015-08-15 DIAGNOSIS — R079 Chest pain, unspecified: Secondary | ICD-10-CM | POA: Diagnosis present

## 2015-08-15 LAB — CBC
HEMATOCRIT: 35.1 % — AB (ref 36.0–46.0)
Hemoglobin: 11.3 g/dL — ABNORMAL LOW (ref 12.0–15.0)
MCH: 29.1 pg (ref 26.0–34.0)
MCHC: 32.2 g/dL (ref 30.0–36.0)
MCV: 90.5 fL (ref 78.0–100.0)
Platelets: 256 10*3/uL (ref 150–400)
RBC: 3.88 MIL/uL (ref 3.87–5.11)
RDW: 12.9 % (ref 11.5–15.5)
WBC: 8.4 10*3/uL (ref 4.0–10.5)

## 2015-08-15 LAB — I-STAT TROPONIN, ED: Troponin i, poc: 0 ng/mL (ref 0.00–0.08)

## 2015-08-15 LAB — BASIC METABOLIC PANEL
Anion gap: 8 (ref 5–15)
BUN: 22 mg/dL — AB (ref 6–20)
CALCIUM: 8.8 mg/dL — AB (ref 8.9–10.3)
CO2: 23 mmol/L (ref 22–32)
Chloride: 109 mmol/L (ref 101–111)
Creatinine, Ser: 1.07 mg/dL — ABNORMAL HIGH (ref 0.44–1.00)
GFR calc Af Amer: >60 mL/min (ref >60)
GLUCOSE: 102 mg/dL — AB (ref 65–99)
Potassium: 4.1 mmol/L (ref 3.5–5.1)
Sodium: 140 mmol/L (ref 135–145)

## 2015-08-15 LAB — PROTIME-INR
INR: 1.08 (ref 0.00–1.49)
PROTHROMBIN TIME: 14.2 s (ref 11.6–15.2)

## 2015-08-15 MED ORDER — SODIUM CHLORIDE 0.9 % IV SOLN
INTRAVENOUS | Status: DC
  Administered 2015-08-15: 23:00:00 via INTRAVENOUS

## 2015-08-15 MED ORDER — IOHEXOL 350 MG/ML SOLN
80.0000 mL | Freq: Once | INTRAVENOUS | Status: AC | PRN
  Administered 2015-08-15: 100 mL via INTRAVENOUS

## 2015-08-15 MED ORDER — MORPHINE SULFATE (PF) 4 MG/ML IV SOLN
4.0000 mg | Freq: Once | INTRAVENOUS | Status: AC
  Administered 2015-08-15: 4 mg via INTRAVENOUS
  Filled 2015-08-15: qty 1

## 2015-08-15 NOTE — ED Notes (Signed)
Pt in EMS from home reporting CP, cough, and cold-like symptoms. Pt also reports nausea, SOB, dizziness, headache. Pt has hx of breast cancer. Given 1NTG, ASA, 4mg  zofran en route

## 2015-08-15 NOTE — ED Provider Notes (Signed)
CSN: [id number]     Arrival date & time 08/15/15  1949 History   First MD Initiated Contact with Patient 08/15/15 2104     Chief Complaint  Patient presents with  . Chest Pain  . Cough     (Consider location/radiation/quality/duration/timing/severity/associated sxs/prior Treatment) HPI Patient poor she has developed some chest congestion and pressure sensation. She states that she has pain in the center of her chest that goes through to her back. She also reports she's felt increasingly short of breath. She has been coughing but not bringing up much sputum. She reports is very painful to take a deep breath. She reports also she's felt generally nauseated and dizzy with a mild headache. Patient reports that it seems like cold like symptoms but with her prior history of breast cancer and pneumonia, she was concerned. She reports the pain has been fairly severe today. She has no fever that she is aware of. She denies swelling of the lower extremities. Patient has a history of an upper extremity DVT and history of Lovenox use. Past Medical History  Diagnosis Date  . Endometriosis   . Hypertension   . Anemia   . Wears glasses   . Iron deficiency anemia, unspecified 02/25/2014  . BRCA1 positive     c.190T>G (p.Cys64Gly) @ [facility]   . History of blood transfusion     last 9'15. due to chemotherapy.  Marland Kitchen DVT (deep venous thrombosis)     Right Subclavian and IJ.. Lovenox stopped 01-10-15 until after surgery planned 02-08-15.  . Fibroid tumor     02-02-15 remains with abdominal pain and some vaginal bleeding due to fibroids.  . Breast cancer 02/01/14    ER-/PR-/Her2+, still receiving chemo 12/21/14, last Herceptin 02-02-15.Left breat cancer.  . Shortness of breath      when Hemoglobin low, now resolved after transfusions 9'15.  . Rash 02/22/2015  . PONV (postoperative nausea and vomiting)     needs scop patch   Past Surgical History  Procedure Laterality Date  . Cervical polypectomy  2010  . Cesarean  section      one previous  . Tubal ligation    . Portacath placement Right 02/23/2014    Procedure: INSERTION PORT-A-CATH;  Surgeon: Joyice Faster. Cornett, MD;  Location: [place];  Service: General;  Laterality: Right;  . Port-a-cath removal N/A 05/21/2014    Procedure: REMOVAL PORT-A-CATH;  Surgeon: Zenovia Jarred, MD;  Location: [place];  Service: General;  Laterality: N/A;  . Tee without cardioversion N/A 05/26/2014    Procedure: TRANSESOPHAGEAL ECHOCARDIOGRAM (TEE);  Surgeon: Larey Dresser, MD;  Location: [facility] ENDOSCOPY;  Service: Cardiovascular;  Laterality: N/A;  . Bilateral total mastectomy with axillary lymph node dissection Bilateral 07/28/2014    Procedure: BILATERAL TOTAL MASTECTOMY WITH LEFT AXILLARY SENTINEL LYMPH NODE BIOPSY;  Surgeon: Stark Klein, MD;  Location: [place];  Service: General;  Laterality: Bilateral;  . Breast reconstruction with placement of tissue expander and flex hd (acellular hydrated dermis) Bilateral 07/28/2014    Procedure: BILATERAL BREAST RECONSTRUCTION WITH PLACEMENT OF TISSUE EXPANDER AND FLEX HD (ACELLULAR HYDRATED DERMIS);  Surgeon: Theodoro Kos, DO;  Location: [place];  Service: Plastics;  Laterality: Bilateral;  . Removal of tissue expander and placement of implant Bilateral 10/14/2014    Procedure: REMOVAL OF BILATERAL BREAST  TISSUE EXPANDER  WITH PLASCEMENT OF BILATERAL  BREAST IMPLANTS;  Surgeon: Theodoro Kos, DO;  Location: [place];  Service: Plastics;  Laterality: Bilateral;  . Left heart  catheterization with coronary angiogram N/A 09/13/2014    Procedure: LEFT HEART CATHETERIZATION WITH CORONARY ANGIOGRAM;  Surgeon: Jolaine Artist, MD;  Location: [facility] CATH LAB;  Service: Cardiovascular;  Laterality: N/A;  . Cardiac catheterization      10'15- Dr. Sung Amabile  . Robotic assisted total hysterectomy with bilateral salpingo oopherectomy Bilateral 02/08/2015    Procedure: ROBOTIC ASSISTED TOTAL HYSTERECTOMY WITH BILATERAL  SALPINGO OOPHORECTOMY; UTERUS WEIGHING GREATER THAN 250 GRAMS;  Surgeon: Everitt Amber, MD;  Location: [facility];  Service: Gynecology;  Laterality: Bilateral;  BRCA 1 GENE MUTATION  . Breast reconstruction Bilateral 03/31/2015    Procedure: BILATERAL BREAST RECONSTRUCTION REVISION WITH BILATERAL LIPOFILLING;  Surgeon: Theodoro Kos, DO;  Location: [place];  Service: Plastics;  Laterality: Bilateral;  . Liposuction with lipofilling Bilateral 03/31/2015    Procedure: LIPOSUCTION WITH LIPOFILLING;  Surgeon: Theodoro Kos, DO;  Location: [place];  Service: Plastics;  Laterality: Bilateral;   Family History  Problem Relation Age of Onset  . Breast cancer Mother 55    currently 36  . Diabetes Father   . Pancreatic cancer Father 28  . Breast cancer Paternal Aunt 32    currently 21; BRCA1 positive  . Stroke Maternal Grandfather   . Cancer Paternal Aunt     unk. primary; deceased 77s  . Breast cancer Cousin     daughter of unaffected paternal aunt; dx in her 36s   Social History  Substance Use Topics  . Smoking status: Former Smoker -- 0.25 packs/day for 15 years    Quit date: 02/18/2009  . Smokeless tobacco: Former Systems developer  . Alcohol Use: No     Comment: occasional   OB History    Gravida Para Term Preterm AB TAB SAB Ectopic Multiple Living   _0 Review of Systems 10 Systems reviewed and are negative for acute change except as noted in the HPI.    Allergies  Compazine  Home Medications   Prior to Admission medications   Medication Sig Start Date End Date Taking? Authorizing Provider  acetaminophen (TYLENOL) 325 MG tablet Take 2 tablets (650 mg total) by mouth every 6 (six) hours as needed for mild pain (or Temp > 100). 07/30/14   Shawn M Rayburn, PA-C  Biotin 5000 MCG TABS Take 10,000 mcg by mouth daily.    Historical Provider, MD  carvedilol (COREG) 12.5 MG tablet Take 1.5 tablets (18.75 mg total) by mouth 2 (two) times daily with a meal.  03/01/15   Jolaine Artist, MD  diphenhydrAMINE (BENADRYL) 25 MG tablet Take 1 tablet (25 mg total) by mouth every 6 (six) hours. 07/10/15   Delsa Grana, PA-C  DULoxetine (CYMBALTA) 60 MG capsule Take 1 capsule (60 mg total) by mouth daily. 04/11/15   Tresa Garter, MD  ferrous sulfate 325 (65 FE) MG tablet Take 1 tablet (325 mg total) by mouth 3 (three) times daily with meals. 04/11/15   Tresa Garter, MD  hydrALAZINE (APRESOLINE) 50 MG tablet Take 1 tablet (50 mg total) by mouth every 8 (eight) hours. 03/01/15   Jolaine Artist, MD  hydrochlorothiazide (MICROZIDE) 12.5 MG capsule Take 1 capsule (12.5 mg total) by mouth daily. 03/01/15   Jolaine Artist, MD  ibuprofen (ADVIL,MOTRIN) 800 MG tablet Take 800 mg by mouth every 8 (eight) hours as needed (pain).    Historical Provider, MD  lisinopril (PRINIVIL,ZESTRIL) 40 MG tablet Take 1 tablet (40  mg total) by mouth daily. 03/01/15   Jolaine Artist, MD  LORazepam (ATIVAN) 0.5 MG tablet Take 0.5 mg by mouth every 8 (eight) hours as needed for anxiety or sleep.    Historical Provider, MD  ondansetron (ZOFRAN) 8 MG tablet Take 8 mg by mouth 2 (two) times daily as needed for nausea or vomiting.     Historical Provider, MD  oxyCODONE-acetaminophen (PERCOCET/ROXICET) 5-325 MG per tablet Take 1-2 tablets by mouth every 4 (four) hours as needed for severe pain. 08/16/15   Charlesetta Shanks, MD  triamcinolone (KENALOG) 0.025 % cream Apply 1 application topically 2 (two) times daily. 07/10/15   Delsa Grana, PA-C   BP 126/67 mmHg  Pulse 53  Resp 16  SpO2 98%  LMP 02/19/2014 Physical Exam  Constitutional: She is oriented to person, place, and time. She appears well-developed and well-nourished.  HENT:  Head: Normocephalic and atraumatic.  Eyes: EOM are normal. Pupils are equal, round, and reactive to light.  Neck: Neck supple.  Cardiovascular: Normal rate, regular rhythm, normal heart sounds and intact distal pulses.   Pulmonary/Chest: Effort  normal and breath sounds normal. She exhibits tenderness.  Abdominal: Soft. Bowel sounds are normal. She exhibits no distension. There is no tenderness.  Musculoskeletal: Normal range of motion. She exhibits no edema.  Neurological: She is alert and oriented to person, place, and time. She has normal strength. Coordination normal. GCS eye subscore is 4. GCS verbal subscore is 5. GCS motor subscore is 6.  Skin: Skin is warm, dry and intact.  Psychiatric: She has a normal mood and affect.    ED Course  Procedures (including critical care time) Labs Review Labs Reviewed  BASIC METABOLIC PANEL - Abnormal; Notable for the following:    Glucose, Bld 102 (*)    BUN 22 (*)    Creatinine, Ser 1.07 (*)    Calcium 8.8 (*)    All other components within normal limits  CBC - Abnormal; Notable for the following:    Hemoglobin 11.3 (*)    HCT 35.1 (*)    All other components within normal limits  PROTIME-INR  Randolm Idol, ED    Imaging Review Dg Chest 2 View  08/15/2015   CLINICAL DATA:  44 year old female with chest pain  EXAM: CHEST  2 VIEW  COMPARISON:  Radiograph dated 03/03/2015  FINDINGS: The heart size and mediastinal contours are within normal limits. Both lungs are clear. The visualized skeletal structures are unremarkable. Multiple surgical clips noted in the left breast and axilla.  IMPRESSION: No active cardiopulmonary disease.   Electronically Signed   By: Anner Crete M.D.   On: 08/15/2015 20:29   Ct Angio Chest Pe W/cm &/or Wo Cm  08/16/2015   CLINICAL DATA:  Chest pain, cough and cold like symptoms. Nausea, shortness of breath, dizziness, headache. History of breast cancer and deep vein thrombosis.  EXAM: CT ANGIOGRAPHY CHEST WITH CONTRAST  TECHNIQUE: Multidetector CT imaging of the chest was performed using the standard protocol during bolus administration of intravenous contrast. Multiplanar CT image reconstructions and MIPs were obtained to evaluate the vascular anatomy.   CONTRAST:  127m OMNIPAQUE IOHEXOL 350 MG/ML SOLN  COMPARISON:  Chest radiograph August 15, 2015 and, CT of the chest December 04, 2014  FINDINGS: PULMONARY ARTERY: Adequate contrast opacification of the pulmonary artery's. Main pulmonary artery is not enlarged. No pulmonary arterial filling defects to the level of the subsegmental branches.  MEDIASTINUM: Heart is mildly enlarged, no right heart strain. Thoracic aorta  is normal course and caliber, unremarkable. No lymphadenopathy by CT size criteria.  LUNGS: Tracheobronchial tree is patent, no pneumothorax. Mild bronchial wall thickening . Heterogeneous lung attenuation. No pleural effusion or focal consolidation.  SOFT TISSUES AND OSSEOUS STRUCTURES: Included view of the abdomen is unremarkable. Bilateral breast implants. Surgical clips in the bilateral axilla  Review of the MIP images confirms the above findings.  IMPRESSION: No acute pulmonary embolism.  Mild cardiomegaly. Peribronchial wall thickening could be seen with pulmonary edema, bronchitis or reactive airway disease without pneumonia.  Heterogeneous lung attenuation, compatible with small airway disease or, pulmonary edema.   Electronically Signed   By: Elon Alas M.D.   On: 08/16/2015 00:07   I have personally reviewed and evaluated these images and lab results as part of my medical decision-making.   EKG Interpretation   Date/Time:  Monday August 15 2015 20:03:19 EDT Ventricular Rate:  60 PR Interval:  157 QRS Duration: 105 QT Interval:  433 QTC Calculation: 433 R Axis:   -29 Text Interpretation:  Sinus rhythm Borderline left axis deviation  Nonspecific T abnormalities, diffuse leads agree. Confirmed by Johnney Killian,  MD, Jeannie Done [phone]) on 08/15/2015 10:42:48 PM      MDM   Final diagnoses:  Other chest pain  History of cardiomyopathy   Patient with history of breast cancer undergoing chemotherapy. He does have prior history of upper extremity DVT. CT does not show any  evidence of DVT. Patient also had developed some cardiomyopathy in association with her chemotherapy. At this time she does not appear to be in overt failure. Vital signs are stable patient is in no acute respiratory distress. She does not have signs of pneumonia. She has not developed fever. She does have significantly reproducible chest wall pain as well. At this point I do believe the patient is safe for discharge with outpatient follow-up with cardiology this week. The patient is nontoxic and otherwise well appearance. She'll be treated for pain with Percocet and schedule cardiology follow-up for resuming her chemotherapy.    Charlesetta Shanks, MD 08/16/15 [phone]

## 2015-08-16 ENCOUNTER — Telehealth (HOSPITAL_COMMUNITY): Payer: Self-pay | Admitting: Vascular Surgery

## 2015-08-16 MED ORDER — ONDANSETRON HCL 4 MG/2ML IJ SOLN
4.0000 mg | Freq: Once | INTRAMUSCULAR | Status: AC
  Administered 2015-08-16: 4 mg via INTRAVENOUS
  Filled 2015-08-16: qty 2

## 2015-08-16 MED ORDER — OXYCODONE-ACETAMINOPHEN 5-325 MG PO TABS: 1.0000 | ORAL_TABLET | ORAL | Status: DC | PRN

## 2015-08-16 NOTE — Telephone Encounter (Signed)
Pt was in ed last night due to chest pain had a chest x ray and CT. She was told to call the office to possibly get a sooner appt w/ Echo

## 2015-08-16 NOTE — Telephone Encounter (Signed)
Left message to call back  

## 2015-08-16 NOTE — Discharge Instructions (Signed)

## 2015-08-17 NOTE — Telephone Encounter (Signed)
Pt returned your call this morning please call her back when you can

## 2015-08-17 NOTE — Telephone Encounter (Signed)
Spoke w/pt, she states she is still SOB even at rest and has chest pain that is going through to her back, she states the percocet she was given in the ER doesn't really help much, appt sch for tomorrow at 10:30

## 2015-08-17 NOTE — Telephone Encounter (Signed)
Left message to call back  

## 2015-08-18 ENCOUNTER — Encounter (HOSPITAL_COMMUNITY): Payer: Self-pay | Admitting: Vascular Surgery

## 2015-08-18 ENCOUNTER — Ambulatory Visit (HOSPITAL_COMMUNITY)
Admission: RE | Admit: 2015-08-18 | Discharge: 2015-08-18 | Disposition: A | Discharge status: Home / Self Care | Payer: Medicaid Other | Source: Ambulatory Visit | Attending: Internal Medicine | Admitting: Internal Medicine

## 2015-08-18 ENCOUNTER — Other Ambulatory Visit: Payer: Medicaid Other

## 2015-08-18 VITALS — BP 116/68 | HR 68 | Wt 216.8 lb

## 2015-08-18 DIAGNOSIS — R0602 Shortness of breath: Secondary | ICD-10-CM | POA: Insufficient documentation

## 2015-08-18 DIAGNOSIS — R0789 Other chest pain: Secondary | ICD-10-CM | POA: Diagnosis present

## 2015-08-18 DIAGNOSIS — R079 Chest pain, unspecified: Secondary | ICD-10-CM | POA: Insufficient documentation

## 2015-08-18 DIAGNOSIS — Z171 Estrogen receptor negative status [ER-]: Secondary | ICD-10-CM | POA: Insufficient documentation

## 2015-08-18 DIAGNOSIS — I429 Cardiomyopathy, unspecified: Secondary | ICD-10-CM | POA: Diagnosis not present

## 2015-08-18 DIAGNOSIS — Z86718 Personal history of other venous thrombosis and embolism: Secondary | ICD-10-CM | POA: Diagnosis not present

## 2015-08-18 DIAGNOSIS — R0600 Dyspnea, unspecified: Secondary | ICD-10-CM

## 2015-08-18 DIAGNOSIS — R072 Precordial pain: Secondary | ICD-10-CM | POA: Insufficient documentation

## 2015-08-18 DIAGNOSIS — I427 Cardiomyopathy due to drug and external agent: Secondary | ICD-10-CM

## 2015-08-18 DIAGNOSIS — I10 Essential (primary) hypertension: Secondary | ICD-10-CM | POA: Insufficient documentation

## 2015-08-18 DIAGNOSIS — C50919 Malignant neoplasm of unspecified site of unspecified female breast: Secondary | ICD-10-CM

## 2015-08-18 DIAGNOSIS — Z9221 Personal history of antineoplastic chemotherapy: Secondary | ICD-10-CM | POA: Insufficient documentation

## 2015-08-18 DIAGNOSIS — Z79899 Other long term (current) drug therapy: Secondary | ICD-10-CM | POA: Diagnosis not present

## 2015-08-18 DIAGNOSIS — C50912 Malignant neoplasm of unspecified site of left female breast: Secondary | ICD-10-CM | POA: Insufficient documentation

## 2015-08-18 DIAGNOSIS — T451X5A Adverse effect of antineoplastic and immunosuppressive drugs, initial encounter: Secondary | ICD-10-CM

## 2015-08-18 LAB — CBC
HCT: 38.9 % (ref 36.0–46.0)
Hemoglobin: 12.4 g/dL (ref 12.0–15.0)
MCH: 28.5 pg (ref 26.0–34.0)
MCHC: 31.9 g/dL (ref 30.0–36.0)
MCV: 89.4 fL (ref 78.0–100.0)
PLATELETS: 318 10*3/uL (ref 150–400)
RBC: 4.35 MIL/uL (ref 3.87–5.11)
RDW: 12.7 % (ref 11.5–15.5)
WBC: 7.3 10*3/uL (ref 4.0–10.5)

## 2015-08-18 LAB — BASIC METABOLIC PANEL
ANION GAP: 6 (ref 5–15)
BUN: 10 mg/dL (ref 6–20)
CALCIUM: 9.5 mg/dL (ref 8.9–10.3)
CO2: 26 mmol/L (ref 22–32)
Chloride: 108 mmol/L (ref 101–111)
Creatinine, Ser: 0.91 mg/dL (ref 0.44–1.00)
GFR calc Af Amer: >60 mL/min (ref >60)
GFR calc non Af Amer: >60 mL/min (ref >60)
Glucose, Bld: 94 mg/dL (ref 65–99)
POTASSIUM: 4 mmol/L (ref 3.5–5.1)
SODIUM: 140 mmol/L (ref 135–145)

## 2015-08-18 LAB — TROPONIN I: Troponin I: <0.03 ng/mL (ref <0.031)

## 2015-08-18 LAB — D-DIMER, QUANTITATIVE (NOT AT ARMC): D DIMER QUANT: 0.33 ug{FEU}/mL (ref 0.00–0.48)

## 2015-08-18 NOTE — Progress Notes (Signed)
CARDIO-ONCOLOGY CLINIC NOTE  Patient ID: Tamara [name], female   DOB: 31-May-1971, 45 y.o.   MRN: [id number] Referring Physician: Dr. Jana Hakim Primary Care: [place] Primary Cardiologist: [place] Surgeon: Dr Migdalia Dk   HPI: Tamara [name] is a 44  yo with a history of HTN and chronic anemia. She was diagnosed with L breast cancer in 3/15. 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 Lymph node was positive for metastatic disease.She is S/P Bilateral Mastectomy with reconstruction 09/02/2014.   Was treated with Taxotere and carboplatinum + Herceptin/perjeta x 6 cycles up front.  Admitted with MSSA bacteremia due to port infection. Completed IV abx. TEE in 6/15 with EF 50-55%. Found to have IJ/subclavian DVT. Treated with lovenox for 6 months. Stopped in June 2016.   Herceptin started 3/15. However echocardiogram 07/05/2014 showed an ejection fraction of 45-50%-- trastuzumab was held after 06/30/2014 dose until EF recovery, resumed 11/09/2014 (a) cath report from 09/13/2014 shows normal coronaries and a normal left ventricular function with an ejection fraction of 55%.             (b)  [facility] 12/2014: EF 56% No wall abnormality noted. RV ok. No infiltrative disease noted.  (c) echo 03/01/2015 suggested recurrent mild cardiomyopathy: Trastuzumab discontinued 02/22/2015             (d) was seen in 8/16. I read ECHO as low normal at 50% (formal report reads 40-45%) and Herceptin restarted. Had one dose on 08/04/2015 ( I reviewed echo with Dr. Aundra Dubin who agreed EF 50-55%)  Follow-up: Here for unscheduled follow-up for CP and SOB. Says over the pas week has had sharp pain in center of her chest going to her back. Also with nasal congestion and cough. Not sure what makes the pain worse but seems to be worse with exertion. Her husband feels like she is  very active and doing too much. CP is not positional. No fevers. Seen in ER last week troponin 0.0 but ECG with new mild diffuse TWI. 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 + inferior lateral wall aneurysm. Small pericardial effusion  ECHO 11/15: EF 55%, basal anterolateral hypokinesis (basal inferolateral wall not well-visualized), lateral S' 13.6, images not adequate to assess strain.  ECHO 03/01/2015: EF 45% lateral s' -9.1 GLS -19.6 ECHO 07/20/15: EF 50% lateral s' 10.5 GLS -14.5 (poor tracking)   SH: Does not smoke or drink  FH: Mother diagnosed breast CA at age 40, living        2 paternal aunts - breast cancer (1 deceased and 1 living)        Father- Deceased at age 7 of cancer not sure what kind        - Has 42 yo, 42 yo, 42 yo, 42 yo and 42 yo (3 boys and 2 girls)  ROS: All systems reviewed and negative except as per HPI.    Past Medical History  Diagnosis  Date  . Endometriosis   . Hypertension   . Anemia   . Wears glasses   . Iron deficiency anemia, unspecified 02/25/2014  . BRCA1 positive     c.190T>G (p.Cys64Gly) @ [facility]   . History of blood transfusion     last 9'15. due to chemotherapy.  Marland Kitchen DVT (deep venous thrombosis)     Right Subclavian and IJ.. Lovenox stopped 01-10-15 until after surgery planned 02-08-15.  . Fibroid tumor     02-02-15 remains with abdominal pain and some vaginal bleeding due to fibroids.  . Breast cancer 02/01/14    ER-/PR-/Her2+, still receiving chemo 12/21/14, last Herceptin 02-02-15.Left breat cancer.  . Shortness of breath      when  Hemoglobin low, now resolved after transfusions 9'15.  . Rash 02/22/2015  . PONV (postoperative nausea and vomiting)     needs scop patch    Current Outpatient Prescriptions  Medication Sig Dispense Refill  . acetaminophen (TYLENOL) 325 MG tablet Take 2 tablets (650 mg total) by mouth every 6 (six) hours as needed for mild pain (or Temp > 100).    . Biotin 5000 MCG TABS Take 10,000 mcg by mouth daily.    . carvedilol (COREG) 12.5 MG tablet Take 1.5 tablets (18.75 mg total) by mouth 2 (two) times daily with a meal. 90 tablet 3  . diphenhydrAMINE (BENADRYL) 25 MG tablet Take 1 tablet (25 mg total) by mouth every 6 (six) hours. 20 tablet 0  . DULoxetine (CYMBALTA) 60 MG capsule Take 1 capsule (60 mg total) by mouth daily. 30 capsule 3  . ferrous sulfate 325 (65 FE) MG tablet Take 1 tablet (325 mg total) by mouth 3 (three) times daily with meals. 270 tablet 3  . hydrALAZINE (APRESOLINE) 50 MG tablet Take 1 tablet (50 mg total) by mouth every 8 (eight) hours. 90 tablet 3  . hydrochlorothiazide (MICROZIDE) 12.5 MG capsule Take 1 capsule (12.5 mg total) by mouth daily. 90 capsule 3  . ibuprofen (ADVIL,MOTRIN) 800 MG tablet Take 800 mg by mouth every 8 (eight) hours as needed (pain).    Marland Kitchen lisinopril (PRINIVIL,ZESTRIL) 40 MG tablet Take 1 tablet (40 mg total) by mouth daily. 90 tablet 3  . LORazepam (ATIVAN) 0.5 MG tablet Take 0.5 mg by mouth every 8 (eight) hours as needed for anxiety or sleep.    Marland Kitchen ondansetron (ZOFRAN) 8 MG tablet Take 8 mg by mouth 2 (two) times daily as needed for nausea or vomiting.     Marland Kitchen oxyCODONE-acetaminophen (PERCOCET/ROXICET) 5-325 MG per tablet Take 1-2 tablets by mouth every 4 (four) hours as needed for severe pain. 20 tablet 0  . triamcinolone (KENALOG) 0.025 % cream Apply 1 application topically 2 (two) times daily. 30 g 0   No current facility-administered medications for this encounter.    Allergies  Allergen Reactions  . Compazine [Prochlorperazine Edisylate] Other  (See Comments)    Stuttering     Filed Vitals:   08/18/15 1052  BP: 116/68  Pulse: 68  Weight: 216 lb 12.8 oz (98.34 kg)  SpO2: 97%   PHYSICAL EXAM: General:  Well appearing. No respiratory difficulty HEENT: normal Neck: supple. no JVD. Carotids 2+ bilat; no bruits. No lymphadenopathy or thryomegaly appreciated. Cor: PMI nondisplaced. Regular rate & rhythm. No rubs, gallops 2/6 TR Chest all versy sore to palpation  Lungs: clear Abdomen: soft, nontender, nondistended. No hepatosplenomegaly. No bruits or masses. Good bowel sounds. Extremities: no cyanosis, clubbing, rash, edema  Neuro: alert & oriented x 3,  cranial nerves grossly intact. moves all 4 extremities w/o difficulty. Affect pleasant.  ASSESSMENT & PLAN:  1) Chest pain: - This is somewhat chronic for her. I suspect this is musculoskeletal but ECG does have new TWI. Cath in 10/15 with normal cors.  Will repeat ECG, troponin and d-dimer here. Sx not c/w pericarditis. Will also repeat echo. If cardiac w/u negative may need GI to see if sx persist.  2) Cardiomyopathy:  This seems to have pre-dated Herceptin use. Echo from 8/16 with EF 50-55%. Now back on Herceptin. Will follow closely. .   - Continue Coreg to 18.75  mg bid. - Continue lisinopril at 40 mg daily  - 3) Breast Cancer L:  HER-2/neu positive with a proliferation marker Ki-67 79%.  Back on herceptin.  4) HTN -Blood pressure well controlled. Continue current regimen. 5) DVT -Finished treatment with lovenox.  Bensimhon, Daniel,MD 11:20 AM

## 2015-08-18 NOTE — Patient Instructions (Addendum)
Labs today  Your physician has requested that you have an echocardiogram. Echocardiography is a painless test that uses sound waves to create images of your heart. It provides your doctor with information about the size and shape of your heart and how well your heart's chambers and valves are working. This procedure takes approximately one hour. There are no restrictions for this procedure.  We will contact you in 3 months to schedule your next appointment and echocardiogram

## 2015-08-19 ENCOUNTER — Ambulatory Visit (HOSPITAL_COMMUNITY)
Admission: RE | Admit: 2015-08-19 | Discharge: 2015-08-19 | Disposition: A | Discharge status: Home / Self Care | Payer: Medicaid Other | Source: Ambulatory Visit | Attending: Internal Medicine | Admitting: Internal Medicine

## 2015-08-19 DIAGNOSIS — Z9013 Acquired absence of bilateral breasts and nipples: Secondary | ICD-10-CM | POA: Insufficient documentation

## 2015-08-19 DIAGNOSIS — Z853 Personal history of malignant neoplasm of breast: Secondary | ICD-10-CM | POA: Insufficient documentation

## 2015-08-19 DIAGNOSIS — I10 Essential (primary) hypertension: Secondary | ICD-10-CM | POA: Insufficient documentation

## 2015-08-19 DIAGNOSIS — I429 Cardiomyopathy, unspecified: Secondary | ICD-10-CM

## 2015-08-19 DIAGNOSIS — R079 Chest pain, unspecified: Secondary | ICD-10-CM | POA: Diagnosis not present

## 2015-08-19 DIAGNOSIS — I071 Rheumatic tricuspid insufficiency: Secondary | ICD-10-CM | POA: Insufficient documentation

## 2015-08-19 DIAGNOSIS — Z86718 Personal history of other venous thrombosis and embolism: Secondary | ICD-10-CM | POA: Diagnosis not present

## 2015-08-23 ENCOUNTER — Telehealth (HOSPITAL_COMMUNITY): Payer: Self-pay | Admitting: Vascular Surgery

## 2015-08-23 NOTE — Telephone Encounter (Signed)
Pt is starting chemo again on Thursday she would like her Echo results.. Please advise

## 2015-08-23 NOTE — Telephone Encounter (Signed)
Pt given echo results 

## 2015-08-25 ENCOUNTER — Other Ambulatory Visit: Payer: Self-pay | Admitting: Oncology

## 2015-08-25 ENCOUNTER — Ambulatory Visit: Payer: Medicaid Other

## 2015-08-25 ENCOUNTER — Encounter: Payer: Self-pay | Admitting: *Deleted

## 2015-08-25 NOTE — Progress Notes (Signed)
Echo results reviewed with Dr. Jana Hakim: Per MD, Pt will not be treated today, he has asked Dr. Tempie Hoist to review her echo results. Pt may resume treatment net week, however this is to be determined. Echo EF 40-45% on 9/16

## 2015-08-26 ENCOUNTER — Ambulatory Visit (HOSPITAL_BASED_OUTPATIENT_CLINIC_OR_DEPARTMENT_OTHER): Payer: Medicaid Other | Admitting: Nurse Practitioner

## 2015-08-26 ENCOUNTER — Emergency Department (HOSPITAL_COMMUNITY): Payer: Medicaid Other

## 2015-08-26 ENCOUNTER — Emergency Department (HOSPITAL_COMMUNITY)
Admission: EM | Admit: 2015-08-26 | Discharge: 2015-08-26 | Disposition: A | Discharge status: Home / Self Care | Payer: Medicaid Other | Attending: Emergency Medicine | Admitting: Emergency Medicine

## 2015-08-26 ENCOUNTER — Telehealth: Payer: Self-pay | Admitting: *Deleted

## 2015-08-26 ENCOUNTER — Other Ambulatory Visit: Payer: Self-pay

## 2015-08-26 ENCOUNTER — Other Ambulatory Visit: Payer: Self-pay | Admitting: *Deleted

## 2015-08-26 ENCOUNTER — Encounter (HOSPITAL_COMMUNITY): Payer: Self-pay | Admitting: *Deleted

## 2015-08-26 VITALS — BP 156/92 | HR 64 | Temp 98.2°F | Resp 18

## 2015-08-26 DIAGNOSIS — Z9889 Other specified postprocedural states: Secondary | ICD-10-CM | POA: Diagnosis not present

## 2015-08-26 DIAGNOSIS — Z79899 Other long term (current) drug therapy: Secondary | ICD-10-CM | POA: Diagnosis not present

## 2015-08-26 DIAGNOSIS — Z8742 Personal history of other diseases of the female genital tract: Secondary | ICD-10-CM | POA: Diagnosis not present

## 2015-08-26 DIAGNOSIS — Z86018 Personal history of other benign neoplasm: Secondary | ICD-10-CM | POA: Diagnosis not present

## 2015-08-26 DIAGNOSIS — R0789 Other chest pain: Secondary | ICD-10-CM | POA: Insufficient documentation

## 2015-08-26 DIAGNOSIS — I10 Essential (primary) hypertension: Secondary | ICD-10-CM | POA: Insufficient documentation

## 2015-08-26 DIAGNOSIS — Z86718 Personal history of other venous thrombosis and embolism: Secondary | ICD-10-CM | POA: Insufficient documentation

## 2015-08-26 DIAGNOSIS — Z87891 Personal history of nicotine dependence: Secondary | ICD-10-CM | POA: Insufficient documentation

## 2015-08-26 DIAGNOSIS — Z853 Personal history of malignant neoplasm of breast: Secondary | ICD-10-CM | POA: Diagnosis not present

## 2015-08-26 DIAGNOSIS — R079 Chest pain, unspecified: Secondary | ICD-10-CM | POA: Diagnosis present

## 2015-08-26 DIAGNOSIS — C50412 Malignant neoplasm of upper-outer quadrant of left female breast: Secondary | ICD-10-CM | POA: Diagnosis not present

## 2015-08-26 DIAGNOSIS — Z1501 Genetic susceptibility to malignant neoplasm of breast: Secondary | ICD-10-CM | POA: Insufficient documentation

## 2015-08-26 DIAGNOSIS — D509 Iron deficiency anemia, unspecified: Secondary | ICD-10-CM | POA: Insufficient documentation

## 2015-08-26 LAB — BASIC METABOLIC PANEL
ANION GAP: 7 (ref 5–15)
BUN: 17 mg/dL (ref 6–20)
CALCIUM: 9.6 mg/dL (ref 8.9–10.3)
CHLORIDE: 106 mmol/L (ref 101–111)
CO2: 27 mmol/L (ref 22–32)
Creatinine, Ser: 0.85 mg/dL (ref 0.44–1.00)
GFR calc non Af Amer: >60 mL/min (ref >60)
Glucose, Bld: 94 mg/dL (ref 65–99)
POTASSIUM: 3.6 mmol/L (ref 3.5–5.1)
Sodium: 140 mmol/L (ref 135–145)

## 2015-08-26 LAB — CBC
HCT: 40 % (ref 36.0–46.0)
HEMOGLOBIN: 13 g/dL (ref 12.0–15.0)
MCH: 29.5 pg (ref 26.0–34.0)
MCHC: 32.5 g/dL (ref 30.0–36.0)
MCV: 90.7 fL (ref 78.0–100.0)
Platelets: 319 10*3/uL (ref 150–400)
RBC: 4.41 MIL/uL (ref 3.87–5.11)
RDW: 12.8 % (ref 11.5–15.5)
WBC: 8.9 10*3/uL (ref 4.0–10.5)

## 2015-08-26 LAB — I-STAT TROPONIN, ED: TROPONIN I, POC: 0 ng/mL (ref 0.00–0.08)

## 2015-08-26 LAB — D-DIMER, QUANTITATIVE: D-Dimer, Quant: <0.27 ug/mL-FEU (ref 0.00–0.48)

## 2015-08-26 MED ORDER — OXYCODONE-ACETAMINOPHEN 5-325 MG PO TABS
1.0000 | ORAL_TABLET | Freq: Once | ORAL | Status: AC
  Administered 2015-08-26: 1 via ORAL
  Filled 2015-08-26: qty 1

## 2015-08-26 NOTE — ED Provider Notes (Signed)
CSN: [id number]     Arrival date & time 08/26/15  1350 History   First MD Initiated Contact with Patient 08/26/15 1501     Chief Complaint  Patient presents with  . Chest Pain     (Consider location/radiation/quality/duration/timing/severity/associated sxs/prior Treatment) HPI   Tamara [name] is a 44 y.o. female with PMH significant for breast cancer (s/p double mastectomy in 2015 and last chemo 08/04/15), HTN, DVT (after surgery 2015) who was seen at [facility] ED on 08/15/2015 for the same complaints of chest pain and shortness of breath. Patient states that for the last 2 weeks she has been experiencing burning, pressure-like intermittent central chest pain that radiates to the upper back. She states that her symptoms have remained the same some her previous visit, maybe slightly worse. Associated symptoms include nausea, headache, and upper back pain.  Denies fevers, chills, cough, vomiting, hemoptysis, abdominal pain, or urinary symptoms. Since her previous ED visit, she has been able to follow up with cardiology and her oncologist.  She reports they told her that the chest pain could be secondary to her surgery, GERD, or anxiety.    Per cardiology note (08/18/15), suspect musculoskeletal in origin.  Echo performed was unchanged from last echo (August 2016).  Troponin and d-dimer were also negative.  New TWI noted on EKG.  Suggest GI follow up for persistent symptoms.    Past Medical History  Diagnosis Date  . Endometriosis   . Hypertension   . Anemia   . Wears glasses   . Iron deficiency anemia, unspecified 02/25/2014  . BRCA1 positive     c.190T>G (p.Cys64Gly) @ [facility]   . History of blood transfusion     last 9'15. due to chemotherapy.  Marland Kitchen DVT (deep venous thrombosis)     Right Subclavian and IJ.. Lovenox stopped 01-10-15 until after surgery planned 02-08-15.  . Fibroid tumor     02-02-15 remains with abdominal pain and some vaginal bleeding due to fibroids.  . Breast cancer 02/01/14   ER-/PR-/Her2+, still receiving chemo 12/21/14, last Herceptin 02-02-15.Left breat cancer.  . Shortness of breath      when Hemoglobin low, now resolved after transfusions 9'15.  . Rash 02/22/2015  . PONV (postoperative nausea and vomiting)     needs scop patch   Past Surgical History  Procedure Laterality Date  . Cervical polypectomy  2010  . Cesarean section      one previous  . Tubal ligation    . Portacath placement Right 02/23/2014    Procedure: INSERTION PORT-A-CATH;  Surgeon: Joyice Faster. Cornett, MD;  Location: [place];  Service: General;  Laterality: Right;  . Port-a-cath removal N/A 05/21/2014    Procedure: REMOVAL PORT-A-CATH;  Surgeon: Zenovia Jarred, MD;  Location: [place];  Service: General;  Laterality: N/A;  . Tee without cardioversion N/A 05/26/2014    Procedure: TRANSESOPHAGEAL ECHOCARDIOGRAM (TEE);  Surgeon: Larey Dresser, MD;  Location: [facility] ENDOSCOPY;  Service: Cardiovascular;  Laterality: N/A;  . Bilateral total mastectomy with axillary lymph node dissection Bilateral 07/28/2014    Procedure: BILATERAL TOTAL MASTECTOMY WITH LEFT AXILLARY SENTINEL LYMPH NODE BIOPSY;  Surgeon: Stark Klein, MD;  Location: [place];  Service: General;  Laterality: Bilateral;  . Breast reconstruction with placement of tissue expander and flex hd (acellular hydrated dermis) Bilateral 07/28/2014    Procedure: BILATERAL BREAST RECONSTRUCTION WITH PLACEMENT OF TISSUE EXPANDER AND FLEX HD (ACELLULAR HYDRATED DERMIS);  Surgeon: Theodoro Kos, DO;  Location: [place];  Service: Plastics;  Laterality: Bilateral;  . Removal of tissue expander and placement of implant Bilateral 10/14/2014    Procedure: REMOVAL OF BILATERAL BREAST  TISSUE EXPANDER  WITH PLASCEMENT OF BILATERAL  BREAST IMPLANTS;  Surgeon: Theodoro Kos, DO;  Location: [place];  Service: Plastics;  Laterality: Bilateral;  . Left heart catheterization with coronary angiogram N/A 09/13/2014    Procedure: LEFT HEART  CATHETERIZATION WITH CORONARY ANGIOGRAM;  Surgeon: Jolaine Artist, MD;  Location: [facility] CATH LAB;  Service: Cardiovascular;  Laterality: N/A;  . Cardiac catheterization      10'15- Dr. Sung Amabile  . Robotic assisted total hysterectomy with bilateral salpingo oopherectomy Bilateral 02/08/2015    Procedure: ROBOTIC ASSISTED TOTAL HYSTERECTOMY WITH BILATERAL SALPINGO OOPHORECTOMY; UTERUS WEIGHING GREATER THAN 250 GRAMS;  Surgeon: Everitt Amber, MD;  Location: [facility];  Service: Gynecology;  Laterality: Bilateral;  BRCA 1 GENE MUTATION  . Breast reconstruction Bilateral 03/31/2015    Procedure: BILATERAL BREAST RECONSTRUCTION REVISION WITH BILATERAL LIPOFILLING;  Surgeon: Theodoro Kos, DO;  Location: [place];  Service: Plastics;  Laterality: Bilateral;  . Liposuction with lipofilling Bilateral 03/31/2015    Procedure: LIPOSUCTION WITH LIPOFILLING;  Surgeon: Theodoro Kos, DO;  Location: [place];  Service: Plastics;  Laterality: Bilateral;   Family History  Problem Relation Age of Onset  . Breast cancer Mother 63    currently 55  . Diabetes Father   . Pancreatic cancer Father 69  . Breast cancer Paternal Aunt 79    currently 23; BRCA1 positive  . Stroke Maternal Grandfather   . Cancer Paternal Aunt     unk. primary; deceased 1s  . Breast cancer Cousin     daughter of unaffected paternal aunt; dx in her 14s   Social History  Substance Use Topics  . Smoking status: Former Smoker -- 0.25 packs/day for 15 years    Quit date: 02/18/2009  . Smokeless tobacco: Former Systems developer  . Alcohol Use: No     Comment: occasional   OB History    Gravida Para Term Preterm AB TAB SAB Ectopic Multiple Living   5 5 5       5      Review of Systems All other systems negative unless otherwise stated in HPI    Allergies  Compazine  Home Medications   Prior to Admission medications   Medication Sig Start Date End Date Taking? Authorizing Provider  acetaminophen (TYLENOL) 325  MG tablet Take 2 tablets (650 mg total) by mouth every 6 (six) hours as needed for mild pain (or Temp > 100). 07/30/14  Yes Shawn M Rayburn, PA-C  Biotin 5000 MCG TABS Take 10,000 mcg by mouth daily.   Yes Historical Provider, MD  carvedilol (COREG) 12.5 MG tablet Take 1.5 tablets (18.75 mg total) by mouth 2 (two) times daily with a meal. 03/01/15  Yes Jolaine Artist, MD  diphenhydrAMINE (BENADRYL) 25 MG tablet Take 1 tablet (25 mg total) by mouth every 6 (six) hours. Patient taking differently: Take 25 mg by mouth every 6 (six) hours as needed for sleep (rash).  07/10/15  Yes Delsa Grana, PA-C  DULoxetine (CYMBALTA) 60 MG capsule Take 1 capsule (60 mg total) by mouth daily. 04/11/15  Yes Tresa Garter, MD  ferrous sulfate 325 (65 FE) MG tablet Take 1 tablet (325 mg total) by mouth 3 (three) times daily with meals. 04/11/15  Yes Tresa Garter, MD  hydrALAZINE (APRESOLINE) 50 MG tablet Take 1 tablet (50 mg total) by mouth every  8 (eight) hours. 03/01/15  Yes Jolaine Artist, MD  ibuprofen (ADVIL,MOTRIN) 800 MG tablet Take 800 mg by mouth every 8 (eight) hours as needed (pain).   Yes Historical Provider, MD  lisinopril (PRINIVIL,ZESTRIL) 40 MG tablet Take 1 tablet (40 mg total) by mouth daily. 03/01/15  Yes Shaune Pascal Bensimhon, MD  LORazepam (ATIVAN) 0.5 MG tablet Take 0.5 mg by mouth every 8 (eight) hours as needed for anxiety or sleep.   Yes Historical Provider, MD  ondansetron (ZOFRAN) 8 MG tablet Take 8 mg by mouth 2 (two) times daily as needed for nausea or vomiting.    Yes Historical Provider, MD  oxyCODONE-acetaminophen (PERCOCET/ROXICET) 5-325 MG per tablet Take 1-2 tablets by mouth every 4 (four) hours as needed for severe pain. 08/16/15  Yes Charlesetta Shanks, MD  hydrochlorothiazide (MICROZIDE) 12.5 MG capsule Take 1 capsule (12.5 mg total) by mouth daily. Patient not taking: Reported on 08/26/2015 03/01/15   Jolaine Artist, MD  triamcinolone (KENALOG) 0.025 % cream Apply 1 application  topically 2 (two) times daily. Patient not taking: Reported on 08/26/2015 07/10/15   Delsa Grana, PA-C   BP 140/84 mmHg  Pulse 66  Temp(Src) 98 F (36.7 C) (Oral)  Resp 15  SpO2 99%  LMP 02/19/2014 Physical Exam  Constitutional: She is oriented to person, place, and time. She appears well-developed and well-nourished.  HENT:  Head: Normocephalic and atraumatic.  Mouth/Throat: Oropharynx is clear and moist.  Eyes: Pupils are equal, round, and reactive to light.  Neck: Normal range of motion. Neck supple.  Cardiovascular: Normal rate, regular rhythm, normal heart sounds and intact distal pulses.   No murmur heard. Pulmonary/Chest: Effort normal and breath sounds normal. No respiratory distress. She has no wheezes. She has no rales.  Abdominal: Soft. Bowel sounds are normal. She exhibits no distension. There is no tenderness. There is no rebound and no guarding.  Musculoskeletal: Normal range of motion.       Arms: Lymphadenopathy:    She has no cervical adenopathy.  Neurological: She is alert and oriented to person, place, and time.  Skin: Skin is warm and dry.  Psychiatric: She has a normal mood and affect. Her behavior is normal.    ED Course  Procedures (including critical care time) Labs Review Labs Reviewed  CBC  BASIC METABOLIC PANEL  D-DIMER, QUANTITATIVE (NOT AT [facility])  Randolm Idol, ED    Imaging Review Dg Chest 2 View  08/26/2015   CLINICAL DATA:  Chest pain  EXAM: CHEST  2 VIEW  COMPARISON:  None.  FINDINGS: Mild cardiomegaly. Normal vascularity. Clear lungs. No pleural effusion or pneumothorax. Postoperative changes in the left axilla.  IMPRESSION: Cardiomegaly without decompensation.   Electronically Signed   By: Marybelle Killings M.D.   On: 08/26/2015 15:23   I have personally reviewed and evaluated these images and lab results as part of my medical decision-making.   EKG Interpretation None      MDM   Final diagnoses:  None   Patient presents with 2  week history of chest pain and shortness of breath, but is unchanged from her previous ED visit on August 15, 2015.  VSS, NAD, appears non-toxic.  On exam, distal pulses intact.  Lungs CTAB.  Heart sounds nl.  Low suspicion for PE/DVT, PNA, pericarditis.  Suspect multifactorial etiology.  Troponin negative.  BMP and CBC show no abnormalities.  CXR shows mild cardiomegaly, no pleural effusion or PTX.  Cardiomegaly w/out decompensation.  PERC 1.  Well's Criteria  2.5.  D-dimer negative.  Doubt PE/DVT.  Suspect multifactorial etiology.  Patient stable for discharge.  Follow up PCP this week.  Patient acknowledges and agrees with the above plan.  Case has been discussed with Dr. Alvino Chapel who agrees with the above plan for discharge.      Gloriann Loan, PA-C 08/26/15 [place], MD 08/27/15 [phone]

## 2015-08-26 NOTE — Discharge Instructions (Signed)

## 2015-08-26 NOTE — ED Notes (Signed)
Nurse starting IV 

## 2015-08-26 NOTE — Telephone Encounter (Signed)
NOW PT. HAS INFORMED THIS NURSE SHE IS WALKING INTO THE CANCER CENTER. INSTRUCTED PT. THAT SHE WILL BE GIVEN A WALK IN FORM TO BE COMPLETED AND PROCESSED.

## 2015-08-26 NOTE — ED Notes (Addendum)
Pt reports central chest pain with SOB and nausea x 2 weeks. Last chemo was on 9/1

## 2015-08-28 ENCOUNTER — Encounter: Payer: Self-pay | Admitting: Nurse Practitioner

## 2015-08-28 DIAGNOSIS — R079 Chest pain, unspecified: Secondary | ICD-10-CM | POA: Insufficient documentation

## 2015-08-28 NOTE — Progress Notes (Signed)
SYMPTOM MANAGEMENT CLINIC   HPI: Tamara [name] 44 y.o. female diagnosed with breast cancer.  Patient is status post double mastectomy with reconstruction.  Patient is status post chemotherapy; and currently undergoing Herceptin therapy.   Patient reports that she developed some significant centralized chest pain that radiated to her back on 08/15/2015.  She was seen in the emergency department for the same complaint on that day; and was fully evaluated per the emergency department.  All cardiac workup was negative on that day; and patient was referred to Dr. Haroldine Laws cardiologist for further evaluation and management.  Since that time.-Patient has been to see the cardiologist; and underwent both an echo and an EKG.  Echo revealed an ejection fraction of 50-55%.  EKG essentially normal; with the exception of a new T-wave inversion.  Patient states that the chest pain comes and goes; and does frequently radiate to her back.  She denies any obvious shortness of breath; but does sometimes experience some pain with inspiration.  She describes the pain as burning at times; and at other times as a pressure in her chest.  Patient states that she has oxycodone at home; but typically never takes it.  Patient has a history of DVT in the past; but has completed Lovenox therapy.  Patient was scheduled to receive Herceptin infusion yesterday 08/25/2015; but Herceptin was held due to patient's complaint.  HPI  ROS  Past Medical History  Diagnosis Date  . Endometriosis   . Hypertension   . Anemia   . Wears glasses   . Iron deficiency anemia, unspecified 02/25/2014  . BRCA1 positive     c.190T>G (p.Cys64Gly) @ [facility]   . History of blood transfusion     last 9'15. due to chemotherapy.  Marland Kitchen DVT (deep venous thrombosis)     Right Subclavian and IJ.. Lovenox stopped 01-10-15 until after surgery planned 02-08-15.  . Fibroid tumor     02-02-15 remains with abdominal pain and some vaginal bleeding due to  fibroids.  . Breast cancer 02/01/14    ER-/PR-/Her2+, still receiving chemo 12/21/14, last Herceptin 02-02-15.Left breat cancer.  . Shortness of breath      when Hemoglobin low, now resolved after transfusions 9'15.  . Rash 02/22/2015  . PONV (postoperative nausea and vomiting)     needs scop patch    Past Surgical History  Procedure Laterality Date  . Cervical polypectomy  2010  . Cesarean section      one previous  . Tubal ligation    . Portacath placement Right 02/23/2014    Procedure: INSERTION PORT-A-CATH;  Surgeon: Joyice Faster. Cornett, MD;  Location: [place];  Service: General;  Laterality: Right;  . Port-a-cath removal N/A 05/21/2014    Procedure: REMOVAL PORT-A-CATH;  Surgeon: Zenovia Jarred, MD;  Location: [place];  Service: General;  Laterality: N/A;  . Tee without cardioversion N/A 05/26/2014    Procedure: TRANSESOPHAGEAL ECHOCARDIOGRAM (TEE);  Surgeon: Larey Dresser, MD;  Location: [facility] ENDOSCOPY;  Service: Cardiovascular;  Laterality: N/A;  . Bilateral total mastectomy with axillary lymph node dissection Bilateral 07/28/2014    Procedure: BILATERAL TOTAL MASTECTOMY WITH LEFT AXILLARY SENTINEL LYMPH NODE BIOPSY;  Surgeon: Stark Klein, MD;  Location: [place];  Service: General;  Laterality: Bilateral;  . Breast reconstruction with placement of tissue expander and flex hd (acellular hydrated dermis) Bilateral 07/28/2014    Procedure: BILATERAL BREAST RECONSTRUCTION WITH PLACEMENT OF TISSUE EXPANDER AND FLEX HD (ACELLULAR HYDRATED DERMIS);  Surgeon: Theodoro Kos, DO;  Location: [place];  Service: Plastics;  Laterality: Bilateral;  . Removal of tissue expander and placement of implant Bilateral 10/14/2014    Procedure: REMOVAL OF BILATERAL BREAST  TISSUE EXPANDER  WITH PLASCEMENT OF BILATERAL  BREAST IMPLANTS;  Surgeon: Theodoro Kos, DO;  Location: [place];  Service: Plastics;  Laterality: Bilateral;  . Left heart catheterization with coronary angiogram N/A  09/13/2014    Procedure: LEFT HEART CATHETERIZATION WITH CORONARY ANGIOGRAM;  Surgeon: Jolaine Artist, MD;  Location: [facility] CATH LAB;  Service: Cardiovascular;  Laterality: N/A;  . Cardiac catheterization      10'15- Dr. Sung Amabile  . Robotic assisted total hysterectomy with bilateral salpingo oopherectomy Bilateral 02/08/2015    Procedure: ROBOTIC ASSISTED TOTAL HYSTERECTOMY WITH BILATERAL SALPINGO OOPHORECTOMY; UTERUS WEIGHING GREATER THAN 250 GRAMS;  Surgeon: Everitt Amber, MD;  Location: [facility];  Service: Gynecology;  Laterality: Bilateral;  BRCA 1 GENE MUTATION  . Breast reconstruction Bilateral 03/31/2015    Procedure: BILATERAL BREAST RECONSTRUCTION REVISION WITH BILATERAL LIPOFILLING;  Surgeon: Theodoro Kos, DO;  Location: [place];  Service: Plastics;  Laterality: Bilateral;  . Liposuction with lipofilling Bilateral 03/31/2015    Procedure: LIPOSUCTION WITH LIPOFILLING;  Surgeon: Theodoro Kos, DO;  Location: [place];  Service: Plastics;  Laterality: Bilateral;    has Breast cancer of upper-outer quadrant of left female breast; HTN (hypertension); Iron deficiency anemia, unspecified; Local infection due to port-a-cath; BRCA1 positive; Palpitations; Staphylococcus aureus bacteremia with sepsis; Sinus tachycardia; AKI (acute kidney injury); Acute blood loss anemia; DVT (deep venous thrombosis) right IJ/subclavian; Bloodstream infection due to port-a-cath; Breast cancer; Anemia; Symptomatic anemia; Absence of breast; Fever; Neoplasm related pain; Cardiomyopathy, ischemic; Cardiomyopathy; New daily persistent headache; Essential hypertension, benign; Moderate recurrent major depression; Heavy menstrual bleeding; 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; Rash; Long term current use of anticoagulant therapy; Chemotherapy induced cardiomyopathy; Precordial chest pain; and Chest pain on her problem list.    is allergic to compazine.    Medication List       This list is accurate as of:  08/26/15  1:59 PM.  Always use your most recent med list.               acetaminophen 325 MG tablet  Commonly known as:  TYLENOL  Take 2 tablets (650 mg total) by mouth every 6 (six) hours as needed for mild pain (or Temp > 100).     Biotin 5000 MCG Tabs  Take 10,000 mcg by mouth daily.     carvedilol 12.5 MG tablet  Commonly known as:  COREG  Take 1.5 tablets (18.75 mg total) by mouth 2 (two) times daily with a meal.     diphenhydrAMINE 25 MG tablet  Commonly known as:  BENADRYL  Take 1 tablet (25 mg total) by mouth every 6 (six) hours.     diphenhydrAMINE 25 mg capsule  Commonly known as:  BENADRYL  TAKE ONE CAPSULE EVERY 6 HOURS     DULoxetine 60 MG capsule  Commonly known as:  CYMBALTA  Take 1 capsule (60 mg total) by mouth daily.     ferrous sulfate 325 (65 FE) MG tablet  Take 1 tablet (325 mg total) by mouth 3 (three) times daily with meals.     hydrALAZINE 50 MG tablet  Commonly known as:  APRESOLINE  Take 1 tablet (50 mg total) by mouth every 8 (eight) hours.     hydrochlorothiazide 12.5 MG capsule  Commonly known as:  MICROZIDE  Take 1 capsule (12.5  mg total) by mouth daily.     ibuprofen 800 MG tablet  Commonly known as:  ADVIL,MOTRIN  Take 800 mg by mouth every 8 (eight) hours as needed (pain).     lisinopril 40 MG tablet  Commonly known as:  PRINIVIL,ZESTRIL  Take 1 tablet (40 mg total) by mouth daily.     LORazepam 0.5 MG tablet  Commonly known as:  ATIVAN  Take 0.5 mg by mouth every 8 (eight) hours as needed for anxiety or sleep.     ondansetron 8 MG tablet  Commonly known as:  ZOFRAN  Take 8 mg by mouth 2 (two) times daily as needed for nausea or vomiting.     oxyCODONE-acetaminophen 5-325 MG per tablet  Commonly known as:  PERCOCET/ROXICET  Take 1-2 tablets by mouth every 4 (four) hours as needed for severe pain.     triamcinolone 0.025 % cream  Commonly known as:  KENALOG  Apply 1 application topically 2 (two) times daily.          PHYSICAL EXAMINATION  Oncology Vitals 08/26/2015 08/26/2015 08/26/2015 08/18/2015 08/16/2015 08/16/2015 08/16/2015  Height - - - - - - -  Weight - - - 98.34 kg - - -  Weight (lbs) - - - 216 lbs 13 oz - - -  BMI (kg/m2) - - - - - - -  Temp 97.7 98 98.2 - - - -  Pulse 62 66 64 68 63 62 63  Resp 14 15 18  - 26 19 20   SpO2 98 99 - 97 96 97 93  BSA (m2) - - - - - - -   BP Readings from Last 3 Encounters:  08/26/15 147/92  08/26/15 156/92  08/18/15 116/68    Physical Exam  Constitutional: She is oriented to person, place, and time and well-developed, well-nourished, and in no distress.  HENT:  Head: Normocephalic and atraumatic.  Mouth/Throat: Oropharynx is clear and moist.  Eyes: Conjunctivae and EOM are normal. Pupils are equal, round, and reactive to light. Right eye exhibits no discharge. Left eye exhibits no discharge. No scleral icterus.  Neck: Normal range of motion. Neck supple. No JVD present. No tracheal deviation present. No thyromegaly present.  Cardiovascular: Normal rate, regular rhythm, normal heart sounds and intact distal pulses.   Pulmonary/Chest: Effort normal and breath sounds normal. No respiratory distress. She has no wheezes. She has no rales. She exhibits no tenderness.  Abdominal: Soft. Bowel sounds are normal. She exhibits no distension and no mass. There is no tenderness. There is no rebound and no guarding.  Musculoskeletal: Normal range of motion. She exhibits no edema or tenderness.  Lymphadenopathy:    She has no cervical adenopathy.  Neurological: She is alert and oriented to person, place, and time. Gait normal.  Skin: Skin is warm and dry. No rash noted. No erythema. No pallor.  Psychiatric:  Slightly anxious.  Nursing note and vitals reviewed.   LABORATORY DATA:. Admission on 08/26/2015, Discharged on 08/26/2015  Component Date Value Ref Range Status  . Sodium 08/26/2015 140  135 - 145 mmol/L Final  . Potassium 08/26/2015 3.6  3.5 - 5.1 mmol/L Final   . Chloride 08/26/2015 106  101 - 111 mmol/L Final  . CO2 08/26/2015 27  22 - 32 mmol/L Final  . Glucose, Bld 08/26/2015 94  65 - 99 mg/dL Final  . BUN 08/26/2015 17  6 - 20 mg/dL Final  . Creatinine, Ser 08/26/2015 0.85  0.44 - 1.00 mg/dL Final  . Calcium  08/26/2015 9.6  8.9 - 10.3 mg/dL Final  . GFR calc non Af Amer 08/26/2015 >60  >60 mL/min Final  . GFR calc Af Amer 08/26/2015 >60  >60 mL/min Final   Comment: (NOTE) The eGFR has been calculated using the CKD EPI equation. This calculation has not been validated in all clinical situations. eGFR's persistently <60 mL/min signify possible Chronic Kidney Disease.   . Anion gap 08/26/2015 7  5 - 15 Final  . WBC 08/26/2015 8.9  4.0 - 10.5 K/uL Final  . RBC 08/26/2015 4.41  3.87 - 5.11 MIL/uL Final  . Hemoglobin 08/26/2015 13.0  12.0 - 15.0 g/dL Final  . HCT 08/26/2015 40.0  36.0 - 46.0 % Final  . MCV 08/26/2015 90.7  78.0 - 100.0 fL Final  . MCH 08/26/2015 29.5  26.0 - 34.0 pg Final  . MCHC 08/26/2015 32.5  30.0 - 36.0 g/dL Final  . RDW 08/26/2015 12.8  11.5 - 15.5 % Final  . Platelets 08/26/2015 319  150 - 400 K/uL Final  . Troponin i, poc 08/26/2015 0.00  0.00 - 0.08 ng/mL Final  . Comment 3 08/26/2015          Final   Comment: Due to the release kinetics of cTnI, a negative result within the first hours of the onset of symptoms does not rule out myocardial infarction with certainty. If myocardial infarction is still suspected, repeat the test at appropriate intervals.   Marland Kitchen D-Dimer, Quant 08/26/2015 <0.27  0.00 - 0.48 ug/mL-FEU Final   Comment:        AT THE INHOUSE ESTABLISHED CUTOFF VALUE OF 0.48 ug/mL FEU, THIS ASSAY HAS BEEN DOCUMENTED IN THE LITERATURE TO HAVE A SENSITIVITY AND NEGATIVE PREDICTIVE VALUE OF AT LEAST 98 TO 99%.  THE TEST RESULT SHOULD BE CORRELATED WITH AN ASSESSMENT OF THE CLINICAL PROBABILITY OF DVT / VTE.      RADIOGRAPHIC STUDIES: Dg Chest 2 View  08/26/2015   CLINICAL DATA:  Chest pain  EXAM:  CHEST  2 VIEW  COMPARISON:  None.  FINDINGS: Mild cardiomegaly. Normal vascularity. Clear lungs. No pleural effusion or pneumothorax. Postoperative changes in the left axilla.  IMPRESSION: Cardiomegaly without decompensation.   Electronically Signed   By: Marybelle Killings M.D.   On: 08/26/2015 15:23    ASSESSMENT/PLAN:    Chest pain Patient reports that she developed some significant centralized chest pain that radiated to her back on 08/15/2015.  She was seen in the emergency department for the same complaint on that day; and was fully evaluated per the emergency department.  All cardiac workup was negative on that day; and patient was referred to Dr. Haroldine Laws cardiologist for further evaluation and management.  Since that time.-Patient has been to see the cardiologist; and underwent both an echo and an EKG.  Echo revealed an ejection fraction of 50-55%.  EKG essentially normal; with the exception of a new T-wave inversion.  Patient states that the chest pain comes and goes; and does frequently radiate to her back.  She denies any obvious shortness of breath; but does sometimes experience some pain with inspiration.  She describes the pain as burning at times; and at other times as a pressure in her chest.  Patient states that she has oxycodone at home; but typically never takes it.  Patient has a history of DVT in the past; but has completed Lovenox therapy.  Patient was scheduled to receive Herceptin infusion yesterday 08/25/2015; but Herceptin was held due to patient's complaint.  On exam.-Patient with bilateral lung sounds clear.  No obvious shortness of breath on exam.  No acute distress noted.  Patient does appear anxious.  Due to patient's complaint-patient will be transported to the emergency department for further evaluation and management.  Brief history and report was called to the emergency department charge nurse; prior to that patient being transported via wheelchair to the emergency  department by the [facility] nurse.  Advised pt that differential diagnoses to consider- if her cardiac workup in the ED is negative; should include muscular pain, GERD, surgical site/scar tissue discomfort, and anxiety. Pt stated understanding of all.   Breast cancer of upper-outer quadrant of left female breast 1. 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 completed 6 cycles of carboplatin, docetaxel, trastuzumab and pertuzumab 06/30/2014 with MRI 07/07/2014 showing a complete radiologic response  3. trastuzumab was to be continued to complete a year (through March 2016); however echocardiogram 07/05/2014 showed an ejection fraction of 45-50%-- trastuzumab was held after 06/30/2014 dose until EF recovery, resumed 11/09/2014 (a) cath report from 09/13/2014 shows normal coronaries and a normal left ventricular function with an ejection fraction of 55%. (b) echo 03/01/2015 suggest continuing mild cardiomyopathy: Trastuzumab discontinued--final dose 02/22/2015  4. 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   5. MRSA port infection and septicemia mid-June 2015; Port was removed, and PICC line placed, completed antibiotic therapy with ANCEF; PICC subsequently pulled   6 genetic testing with the [facility] panel and was found to have a pathogenic mutation in the BRCA1 gene called c.190T>G (p.Cys64Gly  7. status post bilateral mastectomies and left axillary sentinel lymph node biopsy, withimmediate expander placement, on 07/28/14; the pathology ([id number]) showed a complete pathologic response in the left breast and the 3 sentinel lymph nodes sampled; the right breast was  benign (a) bilateral breast reconstruction revision 03/31/2015  8. Bilateral salpingo-oophorectomy and hysterectomy 02/08/15 with benign pathology  Patient was scheduled to receive her Herceptin infusion just yesterday 08/25/2015; but this infusion was held due to patient's complaint of chest discomfort.  Patient is scheduled to return for labs and her next Herceptin infusion on 09/15/2015.  Will review patient's scheduled with Dr. Jana Hakim; to see if he wants to have the patient come in earlier for her next Herceptin infusion-the patient missed her Herceptin infusion this week.  Patient stated understanding of all instructions; and was in agreement with this plan of care. The patient knows to call the clinic with any problems, questions or concerns.   Review/collaboration with Dr. Jana Hakim regarding all aspects of patient's visit today.   Total time spent with patient was 40 minutes;  with greater than 75 percent of that time spent in face to face counseling regarding patient's symptoms,  and coordination of care and follow up.  Disclaimer:This dictation was prepared with Dragon/digital dictation along with [employer]. Any transcriptional errors that result from this process are unintentional.  Drue Second, NP 08/28/2015

## 2015-08-28 NOTE — Assessment & Plan Note (Signed)
1. 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 completed 6 cycles of carboplatin, docetaxel, trastuzumab and pertuzumab 06/30/2014 with MRI 07/07/2014 showing a complete radiologic response  3. trastuzumab was to be continued to complete a year (through March 2016); however echocardiogram 07/05/2014 showed an ejection fraction of 45-50%-- trastuzumab was held after 06/30/2014 dose until EF recovery, resumed 11/09/2014 (a) cath report from 09/13/2014 shows normal coronaries and a normal left ventricular function with an ejection fraction of 55%. (b) echo 03/01/2015 suggest continuing mild cardiomyopathy: Trastuzumab discontinued--final dose 02/22/2015  4. 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   5. MRSA port infection and septicemia mid-June 2015; Port was removed, and PICC line placed, completed antibiotic therapy with ANCEF; PICC subsequently pulled   6 genetic testing with the [facility] panel and was found to have a pathogenic mutation in the BRCA1 gene called c.190T>G (p.Cys64Gly  7. status post bilateral mastectomies and left axillary sentinel lymph node biopsy, withimmediate expander placement, on 07/28/14; the pathology ([id number]) showed a complete pathologic response in the left breast and the 3 sentinel lymph nodes sampled; the right breast was benign (a) bilateral breast reconstruction revision 03/31/2015  8. Bilateral salpingo-oophorectomy and hysterectomy 02/08/15 with benign pathology  Patient was scheduled to receive her Herceptin infusion just yesterday 08/25/2015; but this infusion was held due to patient's complaint of chest discomfort.  Patient is  scheduled to return for labs and her next Herceptin infusion on 09/15/2015.  Will review patient's scheduled with Dr. Jana Hakim; to see if he wants to have the patient come in earlier for her next Herceptin infusion-the patient missed her Herceptin infusion this week.

## 2015-08-28 NOTE — Assessment & Plan Note (Addendum)
Patient reports that she developed some significant centralized chest pain that radiated to her back on 08/15/2015.  She was seen in the emergency department for the same complaint on that day; and was fully evaluated per the emergency department.  All cardiac workup was negative on that day; and patient was referred to Dr. Haroldine Laws cardiologist for further evaluation and management.  Since that time.-Patient has been to see the cardiologist; and underwent both an echo and an EKG.  Echo revealed an ejection fraction of 50-55%.  EKG essentially normal; with the exception of a new T-wave inversion.  Patient states that the chest pain comes and goes; and does frequently radiate to her back.  She denies any obvious shortness of breath; but does sometimes experience some pain with inspiration.  She describes the pain as burning at times; and at other times as a pressure in her chest.  Patient states that she has oxycodone at home; but typically never takes it.  Patient has a history of DVT in the past; but has completed Lovenox therapy.  Patient was scheduled to receive Herceptin infusion yesterday 08/25/2015; but Herceptin was held due to patient's complaint.  On exam.-Patient with bilateral lung sounds clear.  No obvious shortness of breath on exam.  No acute distress noted.  Patient does appear anxious.  Due to patient's complaint-patient will be transported to the emergency department for further evaluation and management.  Brief history and report was called to the emergency department charge nurse; prior to that patient being transported via wheelchair to the emergency department by the [facility] nurse.  Advised pt that differential diagnoses to consider- if her cardiac workup in the ED is negative; should include muscular pain, GERD, surgical site/scar tissue discomfort, and anxiety. Pt stated understanding of all.

## 2015-08-29 ENCOUNTER — Other Ambulatory Visit: Payer: Self-pay | Admitting: Oncology

## 2015-08-29 ENCOUNTER — Telehealth: Payer: Self-pay | Admitting: *Deleted

## 2015-08-29 NOTE — Telephone Encounter (Signed)
INFORMED PT. IT IS USUALLY SEVEN TO TEN DAYS FOR LETTERS TO BE READY. PT. WOULD NOT TALK TO THIS NURSE. SHE STATED "NEVER MIND" AND HUNG UP THE PHONE.

## 2015-08-30 ENCOUNTER — Other Ambulatory Visit: Payer: Self-pay | Admitting: Oncology

## 2015-08-30 ENCOUNTER — Telehealth: Payer: Self-pay | Admitting: Oncology

## 2015-08-30 NOTE — Telephone Encounter (Signed)
Called patient and she is aware of her 9/30 appointments as 9/29 is capped  anne

## 2015-08-30 NOTE — Progress Notes (Unsigned)
Dr Haroldine Laws has reviewed echo and comments ". I don't think EF has changed much. Let's continue her on Herceptin for now and we will follow closely. Ok? Thanks. -dan "  We are resuming herceptin 10/13/as scheduled

## 2015-08-31 ENCOUNTER — Other Ambulatory Visit: Payer: Self-pay | Admitting: Nurse Practitioner

## 2015-09-01 ENCOUNTER — Other Ambulatory Visit: Payer: Self-pay | Admitting: Nurse Practitioner

## 2015-09-01 ENCOUNTER — Other Ambulatory Visit (HOSPITAL_COMMUNITY): Payer: Medicaid Other

## 2015-09-01 ENCOUNTER — Encounter (HOSPITAL_COMMUNITY): Payer: Medicaid Other

## 2015-09-02 ENCOUNTER — Other Ambulatory Visit (HOSPITAL_BASED_OUTPATIENT_CLINIC_OR_DEPARTMENT_OTHER): Payer: Medicaid Other

## 2015-09-02 ENCOUNTER — Other Ambulatory Visit: Payer: Self-pay | Admitting: Nurse Practitioner

## 2015-09-02 ENCOUNTER — Ambulatory Visit (HOSPITAL_BASED_OUTPATIENT_CLINIC_OR_DEPARTMENT_OTHER): Payer: Medicaid Other

## 2015-09-02 VITALS — BP 147/86 | HR 63 | Temp 97.1°F | Resp 16

## 2015-09-02 DIAGNOSIS — C50811 Malignant neoplasm of overlapping sites of right female breast: Secondary | ICD-10-CM | POA: Diagnosis not present

## 2015-09-02 DIAGNOSIS — C50412 Malignant neoplasm of upper-outer quadrant of left female breast: Secondary | ICD-10-CM | POA: Diagnosis not present

## 2015-09-02 DIAGNOSIS — Z5112 Encounter for antineoplastic immunotherapy: Secondary | ICD-10-CM

## 2015-09-02 LAB — COMPREHENSIVE METABOLIC PANEL (CC13)
ALT: 14 U/L (ref 0–55)
ANION GAP: 8 meq/L (ref 3–11)
AST: 13 U/L (ref 5–34)
Albumin: 4 g/dL (ref 3.5–5.0)
Alkaline Phosphatase: 98 U/L (ref 40–150)
BILIRUBIN TOTAL: 0.39 mg/dL (ref 0.20–1.20)
BUN: 17.8 mg/dL (ref 7.0–26.0)
CALCIUM: 9.7 mg/dL (ref 8.4–10.4)
CHLORIDE: 110 meq/L — AB (ref 98–109)
CO2: 24 mEq/L (ref 22–29)
CREATININE: 0.9 mg/dL (ref 0.6–1.1)
EGFR: 88 mL/min/{1.73_m2} — AB (ref >90)
Glucose: 90 mg/dl (ref 70–140)
Potassium: 3.9 mEq/L (ref 3.5–5.1)
Sodium: 142 mEq/L (ref 136–145)
Total Protein: 7.1 g/dL (ref 6.4–8.3)

## 2015-09-02 LAB — CBC WITH DIFFERENTIAL/PLATELET
BASO%: 0.1 % (ref 0.0–2.0)
BASOS ABS: 0 10*3/uL (ref 0.0–0.1)
EOS ABS: 0.2 10*3/uL (ref 0.0–0.5)
EOS%: 3 % (ref 0.0–7.0)
HEMATOCRIT: 38.3 % (ref 34.8–46.6)
HGB: 12.7 g/dL (ref 11.6–15.9)
LYMPH#: 2 10*3/uL (ref 0.9–3.3)
LYMPH%: 27 % (ref 14.0–49.7)
MCH: 29.5 pg (ref 25.1–34.0)
MCHC: 33.2 g/dL (ref 31.5–36.0)
MCV: 89.1 fL (ref 79.5–101.0)
MONO#: 0.4 10*3/uL (ref 0.1–0.9)
MONO%: 5.7 % (ref 0.0–14.0)
NEUT#: 4.8 10*3/uL (ref 1.5–6.5)
NEUT%: 64.2 % (ref 38.4–76.8)
PLATELETS: 311 10*3/uL (ref 145–400)
RBC: 4.3 10*6/uL (ref 3.70–5.45)
RDW: 12.6 % (ref 11.2–14.5)
WBC: 7.6 10*3/uL (ref 3.9–10.3)

## 2015-09-02 MED ORDER — DIPHENHYDRAMINE HCL 25 MG PO CAPS
ORAL_CAPSULE | ORAL | Status: AC
  Filled 2015-09-02: qty 1

## 2015-09-02 MED ORDER — SODIUM CHLORIDE 0.9 % IV SOLN
4.0000 mg/kg | Freq: Once | INTRAVENOUS | Status: AC
  Administered 2015-09-02: 378 mg via INTRAVENOUS
  Filled 2015-09-02: qty 18

## 2015-09-02 MED ORDER — DIPHENHYDRAMINE HCL 25 MG PO CAPS
25.0000 mg | ORAL_CAPSULE | Freq: Once | ORAL | Status: AC
  Administered 2015-09-02: 25 mg via ORAL

## 2015-09-02 MED ORDER — ACETAMINOPHEN 325 MG PO TABS
ORAL_TABLET | ORAL | Status: AC
  Filled 2015-09-02: qty 2

## 2015-09-02 MED ORDER — ACETAMINOPHEN 325 MG PO TABS
650.0000 mg | ORAL_TABLET | Freq: Once | ORAL | Status: AC
  Administered 2015-09-02: 650 mg via ORAL

## 2015-09-02 MED ORDER — SODIUM CHLORIDE 0.9 % IV SOLN
Freq: Once | INTRAVENOUS | Status: AC
  Administered 2015-09-02: 10:00:00 via INTRAVENOUS

## 2015-09-02 NOTE — Patient Instructions (Signed)
[facility] Discharge Instructions for Patients Receiving Chemotherapy  Today you received the following chemotherapy agents Herceptin.  To help prevent nausea and vomiting after your treatment, we encourage you to take your nausea medication as prescribed by your physician. If you develop nausea and vomiting that is not controlled by your nausea medication, call the clinic.   BELOW ARE SYMPTOMS THAT SHOULD BE REPORTED IMMEDIATELY:  *FEVER GREATER THAN 100.5 F  *CHILLS WITH OR WITHOUT FEVER  NAUSEA AND VOMITING THAT IS NOT CONTROLLED WITH YOUR NAUSEA MEDICATION  *UNUSUAL SHORTNESS OF BREATH  *UNUSUAL BRUISING OR BLEEDING  TENDERNESS IN MOUTH AND THROAT WITH OR WITHOUT PRESENCE OF ULCERS  *URINARY PROBLEMS  *BOWEL PROBLEMS  UNUSUAL RASH Items with * indicate a potential emergency and should be followed up as soon as possible.  Feel free to call the clinic you have any questions or concerns. The clinic phone number is [phone].  Please show the CHEMO ALERT CARD at check-in to the Emergency Department and triage nurse.   

## 2015-09-02 NOTE — Progress Notes (Signed)
1015-EF 40-45%; per Nira Conn NP, okay to tx. Pt reports Dr. Jana Hakim called her Tuesday evening also and told her it was okay to tx, so pt is aware.

## 2015-09-04 ENCOUNTER — Emergency Department (HOSPITAL_COMMUNITY)
Admission: EM | Admit: 2015-09-04 | Discharge: 2015-09-04 | Disposition: A | Discharge status: Home / Self Care | Payer: Medicaid Other | Attending: Emergency Medicine | Admitting: Emergency Medicine

## 2015-09-04 ENCOUNTER — Encounter (HOSPITAL_COMMUNITY): Payer: Self-pay | Admitting: *Deleted

## 2015-09-04 DIAGNOSIS — Z86718 Personal history of other venous thrombosis and embolism: Secondary | ICD-10-CM | POA: Diagnosis not present

## 2015-09-04 DIAGNOSIS — Z9889 Other specified postprocedural states: Secondary | ICD-10-CM | POA: Diagnosis not present

## 2015-09-04 DIAGNOSIS — Z86018 Personal history of other benign neoplasm: Secondary | ICD-10-CM | POA: Insufficient documentation

## 2015-09-04 DIAGNOSIS — Y9389 Activity, other specified: Secondary | ICD-10-CM | POA: Diagnosis not present

## 2015-09-04 DIAGNOSIS — Y9289 Other specified places as the place of occurrence of the external cause: Secondary | ICD-10-CM | POA: Insufficient documentation

## 2015-09-04 DIAGNOSIS — W460XXA Contact with hypodermic needle, initial encounter: Secondary | ICD-10-CM | POA: Insufficient documentation

## 2015-09-04 DIAGNOSIS — S5011XA Contusion of right forearm, initial encounter: Secondary | ICD-10-CM | POA: Diagnosis not present

## 2015-09-04 DIAGNOSIS — D649 Anemia, unspecified: Secondary | ICD-10-CM | POA: Diagnosis not present

## 2015-09-04 DIAGNOSIS — Z853 Personal history of malignant neoplasm of breast: Secondary | ICD-10-CM | POA: Diagnosis not present

## 2015-09-04 DIAGNOSIS — Z87891 Personal history of nicotine dependence: Secondary | ICD-10-CM | POA: Insufficient documentation

## 2015-09-04 DIAGNOSIS — Z973 Presence of spectacles and contact lenses: Secondary | ICD-10-CM | POA: Diagnosis not present

## 2015-09-04 DIAGNOSIS — Z79899 Other long term (current) drug therapy: Secondary | ICD-10-CM | POA: Insufficient documentation

## 2015-09-04 DIAGNOSIS — Y998 Other external cause status: Secondary | ICD-10-CM | POA: Insufficient documentation

## 2015-09-04 DIAGNOSIS — I10 Essential (primary) hypertension: Secondary | ICD-10-CM | POA: Diagnosis not present

## 2015-09-04 DIAGNOSIS — Z8742 Personal history of other diseases of the female genital tract: Secondary | ICD-10-CM | POA: Insufficient documentation

## 2015-09-04 DIAGNOSIS — S59911A Unspecified injury of right forearm, initial encounter: Secondary | ICD-10-CM | POA: Diagnosis present

## 2015-09-04 LAB — BASIC METABOLIC PANEL
ANION GAP: 3 — AB (ref 5–15)
BUN: 18 mg/dL (ref 6–20)
CO2: 26 mmol/L (ref 22–32)
Calcium: 9 mg/dL (ref 8.9–10.3)
Chloride: 111 mmol/L (ref 101–111)
Creatinine, Ser: 0.92 mg/dL (ref 0.44–1.00)
GFR calc Af Amer: >60 mL/min (ref >60)
Glucose, Bld: 97 mg/dL (ref 65–99)
POTASSIUM: 3.5 mmol/L (ref 3.5–5.1)
SODIUM: 140 mmol/L (ref 135–145)

## 2015-09-04 LAB — CBC
HCT: 35.1 % — ABNORMAL LOW (ref 36.0–46.0)
Hemoglobin: 11.4 g/dL — ABNORMAL LOW (ref 12.0–15.0)
MCH: 28.9 pg (ref 26.0–34.0)
MCHC: 32.5 g/dL (ref 30.0–36.0)
MCV: 88.9 fL (ref 78.0–100.0)
PLATELETS: 291 10*3/uL (ref 150–400)
RBC: 3.95 MIL/uL (ref 3.87–5.11)
RDW: 12.4 % (ref 11.5–15.5)
WBC: 9.4 10*3/uL (ref 4.0–10.5)

## 2015-09-04 LAB — PROTIME-INR
INR: 1.12 (ref 0.00–1.49)
PROTHROMBIN TIME: 14.6 s (ref 11.6–15.2)

## 2015-09-04 NOTE — ED Notes (Signed)
Pt reports currently being treated for left upper out quadrant breast cancer of left breast, received chemo on 9/30, pt noticed bruise at IV insertion site that night. Today pt has circular dark bruise larger than a quarter. Denies pain. Some redness noted around bruise, unsure if it is rash.

## 2015-09-04 NOTE — ED Notes (Signed)
Awake. Verbally responsive. A/O x4. Resp even and unlabored. No audible adventitious breath sounds noted. ABC's intact.  

## 2015-09-04 NOTE — ED Notes (Signed)
Pt has bruise to rt inner forearm without nodule noted. Pt reported that the bruise developed after having chemotherapy on Friday and has gotten bigger. No drainage or s/s of infection/cellulitis noted.

## 2015-09-04 NOTE — ED Provider Notes (Signed)
CSN: [id number]     Arrival date & time 09/04/15  1651 History   First MD Initiated Contact with Patient 09/04/15 1731     Chief Complaint  Patient presents with  . ca pt, bruising      (Consider location/radiation/quality/duration/timing/severity/associated sxs/prior Treatment) Patient is a 44 y.o. female presenting with general illness. The history is provided by the patient.  Illness Location:  Right forearm Quality:  Bruising Severity:  Mild Onset quality:  Gradual Duration:  2 days Timing:  Constant Progression:  Improving Chronicity:  New Context:  Had an IV at this site Relieved by:  Nothing Worsened by:  Nothing Associated symptoms: no abdominal pain, no cough, no diarrhea, no fever, no shortness of breath and no vomiting     Past Medical History  Diagnosis Date  . Endometriosis   . Hypertension   . Anemia   . Wears glasses   . Iron deficiency anemia, unspecified 02/25/2014  . BRCA1 positive     c.190T>G (p.Cys64Gly) @ [facility]   . History of blood transfusion     last 9'15. due to chemotherapy.  Marland Kitchen DVT (deep venous thrombosis) (HCC)     Right Subclavian and IJ.. Lovenox stopped 01-10-15 until after surgery planned 02-08-15.  . Fibroid tumor     02-02-15 remains with abdominal pain and some vaginal bleeding due to fibroids.  . Breast cancer ([place]) 02/01/14    ER-/PR-/Her2+, still receiving chemo 12/21/14, last Herceptin 02-02-15.Left breat cancer.  . Shortness of breath      when Hemoglobin low, now resolved after transfusions 9'15.  . Rash 02/22/2015  . PONV (postoperative nausea and vomiting)     needs scop patch   Past Surgical History  Procedure Laterality Date  . Cervical polypectomy  2010  . Cesarean section      one previous  . Tubal ligation    . Portacath placement Right 02/23/2014    Procedure: INSERTION PORT-A-CATH;  Surgeon: Joyice Faster. Cornett, MD;  Location: [place];  Service: General;  Laterality: Right;  . Port-a-cath removal N/A  05/21/2014    Procedure: REMOVAL PORT-A-CATH;  Surgeon: Zenovia Jarred, MD;  Location: [place];  Service: General;  Laterality: N/A;  . Tee without cardioversion N/A 05/26/2014    Procedure: TRANSESOPHAGEAL ECHOCARDIOGRAM (TEE);  Surgeon: Larey Dresser, MD;  Location: [facility] ENDOSCOPY;  Service: Cardiovascular;  Laterality: N/A;  . Bilateral total mastectomy with axillary lymph node dissection Bilateral 07/28/2014    Procedure: BILATERAL TOTAL MASTECTOMY WITH LEFT AXILLARY SENTINEL LYMPH NODE BIOPSY;  Surgeon: Stark Klein, MD;  Location: [place];  Service: General;  Laterality: Bilateral;  . Breast reconstruction with placement of tissue expander and flex hd (acellular hydrated dermis) Bilateral 07/28/2014    Procedure: BILATERAL BREAST RECONSTRUCTION WITH PLACEMENT OF TISSUE EXPANDER AND FLEX HD (ACELLULAR HYDRATED DERMIS);  Surgeon: Theodoro Kos, DO;  Location: [place];  Service: Plastics;  Laterality: Bilateral;  . Removal of tissue expander and placement of implant Bilateral 10/14/2014    Procedure: REMOVAL OF BILATERAL BREAST  TISSUE EXPANDER  WITH PLASCEMENT OF BILATERAL  BREAST IMPLANTS;  Surgeon: Theodoro Kos, DO;  Location: [place];  Service: Plastics;  Laterality: Bilateral;  . Left heart catheterization with coronary angiogram N/A 09/13/2014    Procedure: LEFT HEART CATHETERIZATION WITH CORONARY ANGIOGRAM;  Surgeon: Jolaine Artist, MD;  Location: [facility] CATH LAB;  Service: Cardiovascular;  Laterality: N/A;  . Cardiac catheterization      10'15- Dr. Sung Amabile  . Robotic  assisted total hysterectomy with bilateral salpingo oopherectomy Bilateral 02/08/2015    Procedure: ROBOTIC ASSISTED TOTAL HYSTERECTOMY WITH BILATERAL SALPINGO OOPHORECTOMY; UTERUS WEIGHING GREATER THAN 250 GRAMS;  Surgeon: Everitt Amber, MD;  Location: [facility];  Service: Gynecology;  Laterality: Bilateral;  BRCA 1 GENE MUTATION  . Breast reconstruction Bilateral 03/31/2015    Procedure: BILATERAL BREAST RECONSTRUCTION  REVISION WITH BILATERAL LIPOFILLING;  Surgeon: Theodoro Kos, DO;  Location: [place];  Service: Plastics;  Laterality: Bilateral;  . Liposuction with lipofilling Bilateral 03/31/2015    Procedure: LIPOSUCTION WITH LIPOFILLING;  Surgeon: Theodoro Kos, DO;  Location: [place];  Service: Plastics;  Laterality: Bilateral;   Family History  Problem Relation Age of Onset  . Breast cancer Mother 27    currently 45  . Diabetes Father   . Pancreatic cancer Father 23  . Breast cancer Paternal Aunt 64    currently 82; BRCA1 positive  . Stroke Maternal Grandfather   . Cancer Paternal Aunt     unk. primary; deceased 69s  . Breast cancer Cousin     daughter of unaffected paternal aunt; dx in her 29s   Social History  Substance Use Topics  . Smoking status: Former Smoker -- 0.25 packs/day for 15 years    Quit date: 02/18/2009  . Smokeless tobacco: Former Systems developer  . Alcohol Use: No     Comment: occasional   OB History    Gravida Para Term Preterm AB TAB SAB Ectopic Multiple Living   _0 Review of Systems  Constitutional: Negative for fever.  Respiratory: Negative for cough and shortness of breath.   Gastrointestinal: Negative for vomiting, abdominal pain and diarrhea.  Hematological: Bruises/bleeds easily (easy gum bleeding with brushing her teeth).  All other systems reviewed and are negative.     Allergies  Compazine  Home Medications   Prior to Admission medications   Medication Sig Start Date End Date Taking? Authorizing Provider  acetaminophen (TYLENOL) 325 MG tablet Take 2 tablets (650 mg total) by mouth every 6 (six) hours as needed for mild pain (or Temp > 100). 07/30/14  Yes Shawn M Rayburn, PA-C  Biotin 5000 MCG TABS Take 10,000 mcg by mouth daily.   Yes Historical Provider, MD  carvedilol (COREG) 12.5 MG tablet Take 1.5 tablets (18.75 mg total) by mouth 2 (two) times daily with a meal. 03/01/15  Yes Jolaine Artist, MD    diphenhydrAMINE (BENADRYL) 25 MG tablet Take 1 tablet (25 mg total) by mouth every 6 (six) hours. Patient taking differently: Take 25 mg by mouth every 6 (six) hours as needed for sleep (rash).  07/10/15  Yes Delsa Grana, PA-C  DULoxetine (CYMBALTA) 60 MG capsule Take 1 capsule (60 mg total) by mouth daily. 04/11/15  Yes Tresa Garter, MD  ferrous sulfate 325 (65 FE) MG tablet Take 1 tablet (325 mg total) by mouth 3 (three) times daily with meals. 04/11/15  Yes Tresa Garter, MD  hydrALAZINE (APRESOLINE) 50 MG tablet Take 1 tablet (50 mg total) by mouth every 8 (eight) hours. 03/01/15  Yes Jolaine Artist, MD  hydrochlorothiazide (MICROZIDE) 12.5 MG capsule Take 1 capsule (12.5 mg total) by mouth daily. Patient taking differently: Take 12.5 mg by mouth daily as needed (high blood pressure).  03/01/15  Yes Jolaine Artist, MD  ibuprofen (ADVIL,MOTRIN) 800 MG tablet Take 800 mg by mouth every 8 (eight) hours as needed (pain).  Yes Historical Provider, MD  lisinopril (PRINIVIL,ZESTRIL) 40 MG tablet Take 1 tablet (40 mg total) by mouth daily. 03/01/15  Yes Shaune Pascal Bensimhon, MD  LORazepam (ATIVAN) 0.5 MG tablet Take 0.5 mg by mouth every 8 (eight) hours as needed for anxiety or sleep.   Yes Historical Provider, MD  ondansetron (ZOFRAN) 8 MG tablet Take 8 mg by mouth 2 (two) times daily as needed for nausea or vomiting.    Yes Historical Provider, MD  oxyCODONE-acetaminophen (PERCOCET/ROXICET) 5-325 MG per tablet Take 1-2 tablets by mouth every 4 (four) hours as needed for severe pain. 08/16/15  Yes Charlesetta Shanks, MD  PRESCRIPTION MEDICATION Chemo at [facility]   Yes Historical Provider, MD  triamcinolone (KENALOG) 0.025 % cream Apply 1 application topically 2 (two) times daily. Patient not taking: Reported on 08/26/2015 07/10/15   Delsa Grana, PA-C   BP 150/96 mmHg  Pulse 68  Temp(Src) 98.1 F (36.7 C) (Oral)  Resp 16  SpO2 99%  LMP 02/19/2014 Physical Exam  Constitutional: She is oriented to  person, place, and time. She appears well-developed and well-nourished. No distress.  HENT:  Head: Normocephalic and atraumatic.  Mouth/Throat: Oropharynx is clear and moist.  Eyes: EOM are normal. Pupils are equal, round, and reactive to light.  Neck: Normal range of motion. Neck supple.  Cardiovascular: Normal rate and regular rhythm.  Exam reveals no friction rub.   No murmur heard. Pulmonary/Chest: Effort normal and breath sounds normal. No respiratory distress. She has no wheezes. She has no rales.  Abdominal: Soft. She exhibits no distension. There is no tenderness. There is no rebound.  Musculoskeletal: Normal range of motion. She exhibits no edema.  Neurological: She is alert and oriented to person, place, and time.  Skin: Skin is warm. No rash noted. She is not diaphoretic.     Nursing note and vitals reviewed.   ED Course  Procedures (including critical care time) Labs Review Labs Reviewed  CBC  BASIC METABOLIC PANEL  PROTIME-INR    Imaging Review No results found. I have personally reviewed and evaluated these images and lab results as part of my medical decision-making.   EKG Interpretation None      MDM   Final diagnoses:  Traumatic ecchymosis of forearm, right, initial encounter    31F here with bruising to her R forearm. It is at the site where she had an IV for recent chemotherapy infusion. It is mildly bruised today. She denies any other current problems. When asked about using bleeding, she did state some mild gum bleeding. She does have history of blood clots before during her breast cancer, but is not currently on any anticoagulants.  Small bruise on right forearm. Will check lab since she had some easy gum bleeding. I suspect she is okay. Labs normal. Stable for discharge.    Evelina Bucy, MD 09/05/15 0030

## 2015-09-04 NOTE — Discharge Instructions (Signed)

## 2015-09-06 ENCOUNTER — Encounter: Payer: Self-pay | Admitting: Oncology

## 2015-09-06 ENCOUNTER — Other Ambulatory Visit: Payer: Self-pay | Admitting: Oncology

## 2015-09-06 ENCOUNTER — Telehealth: Payer: Self-pay

## 2015-09-06 NOTE — Telephone Encounter (Signed)
LMOVM - letter ready for pickup at front desk.  Pt to call if she has any questions.  Copy of letter sent to scan.

## 2015-09-08 ENCOUNTER — Encounter: Payer: Self-pay | Admitting: Nurse Practitioner

## 2015-09-08 ENCOUNTER — Other Ambulatory Visit: Payer: Self-pay | Admitting: Nurse Practitioner

## 2015-09-08 ENCOUNTER — Other Ambulatory Visit: Payer: Medicaid Other

## 2015-09-15 ENCOUNTER — Ambulatory Visit (HOSPITAL_BASED_OUTPATIENT_CLINIC_OR_DEPARTMENT_OTHER): Payer: Medicaid Other

## 2015-09-15 ENCOUNTER — Other Ambulatory Visit: Payer: Self-pay | Admitting: Oncology

## 2015-09-15 ENCOUNTER — Other Ambulatory Visit (HOSPITAL_BASED_OUTPATIENT_CLINIC_OR_DEPARTMENT_OTHER): Payer: Medicaid Other

## 2015-09-15 VITALS — BP 152/93 | HR 61 | Temp 97.5°F | Resp 18

## 2015-09-15 DIAGNOSIS — C50412 Malignant neoplasm of upper-outer quadrant of left female breast: Secondary | ICD-10-CM

## 2015-09-15 DIAGNOSIS — D0512 Intraductal carcinoma in situ of left breast: Secondary | ICD-10-CM

## 2015-09-15 DIAGNOSIS — Z5112 Encounter for antineoplastic immunotherapy: Secondary | ICD-10-CM | POA: Diagnosis present

## 2015-09-15 LAB — COMPREHENSIVE METABOLIC PANEL (CC13)
ALT: 13 U/L (ref 0–55)
ANION GAP: 8 meq/L (ref 3–11)
AST: 13 U/L (ref 5–34)
Albumin: 3.9 g/dL (ref 3.5–5.0)
Alkaline Phosphatase: 95 U/L (ref 40–150)
BILIRUBIN TOTAL: 0.33 mg/dL (ref 0.20–1.20)
BUN: 15.8 mg/dL (ref 7.0–26.0)
CHLORIDE: 112 meq/L — AB (ref 98–109)
CO2: 23 meq/L (ref 22–29)
CREATININE: 0.9 mg/dL (ref 0.6–1.1)
Calcium: 9.4 mg/dL (ref 8.4–10.4)
EGFR: >90 mL/min/{1.73_m2} (ref >90)
GLUCOSE: 115 mg/dL (ref 70–140)
Potassium: 3.9 mEq/L (ref 3.5–5.1)
SODIUM: 142 meq/L (ref 136–145)
TOTAL PROTEIN: 6.9 g/dL (ref 6.4–8.3)

## 2015-09-15 LAB — CBC WITH DIFFERENTIAL/PLATELET
BASO%: 0.1 % (ref 0.0–2.0)
Basophils Absolute: 0 10*3/uL (ref 0.0–0.1)
EOS%: 3 % (ref 0.0–7.0)
Eosinophils Absolute: 0.2 10*3/uL (ref 0.0–0.5)
HCT: 37 % (ref 34.8–46.6)
HGB: 12.1 g/dL (ref 11.6–15.9)
LYMPH%: 28.9 % (ref 14.0–49.7)
MCH: 29.2 pg (ref 25.1–34.0)
MCHC: 32.7 g/dL (ref 31.5–36.0)
MCV: 89.2 fL (ref 79.5–101.0)
MONO#: 0.5 10*3/uL (ref 0.1–0.9)
MONO%: 6.2 % (ref 0.0–14.0)
NEUT%: 61.8 % (ref 38.4–76.8)
NEUTROS ABS: 4.7 10*3/uL (ref 1.5–6.5)
PLATELETS: 280 10*3/uL (ref 145–400)
RBC: 4.15 10*6/uL (ref 3.70–5.45)
RDW: 12.5 % (ref 11.2–14.5)
WBC: 7.6 10*3/uL (ref 3.9–10.3)
lymph#: 2.2 10*3/uL (ref 0.9–3.3)

## 2015-09-15 MED ORDER — DIPHENHYDRAMINE HCL 25 MG PO CAPS
ORAL_CAPSULE | ORAL | Status: AC
  Filled 2015-09-15: qty 1

## 2015-09-15 MED ORDER — DIPHENHYDRAMINE HCL 25 MG PO CAPS
25.0000 mg | ORAL_CAPSULE | Freq: Once | ORAL | Status: AC
  Administered 2015-09-15: 25 mg via ORAL

## 2015-09-15 MED ORDER — ACETAMINOPHEN 325 MG PO TABS
ORAL_TABLET | ORAL | Status: AC
  Filled 2015-09-15: qty 2

## 2015-09-15 MED ORDER — SODIUM CHLORIDE 0.9 % IV SOLN
Freq: Once | INTRAVENOUS | Status: AC
  Administered 2015-09-15: 11:00:00 via INTRAVENOUS

## 2015-09-15 MED ORDER — TRASTUZUMAB CHEMO INJECTION 440 MG
6.0000 mg/kg | Freq: Once | INTRAVENOUS | Status: AC
  Administered 2015-09-15: 567 mg via INTRAVENOUS
  Filled 2015-09-15: qty 27

## 2015-09-15 MED ORDER — ACETAMINOPHEN 325 MG PO TABS
650.0000 mg | ORAL_TABLET | Freq: Once | ORAL | Status: AC
  Administered 2015-09-15: 650 mg via ORAL

## 2015-09-15 NOTE — Patient Instructions (Signed)
[facility] Discharge Instructions for Patients Receiving Chemotherapy  Today you received the following chemotherapy agents herceptin   To help prevent nausea and vomiting after your treatment, we encourage you to take your nausea medication as directed   If you develop nausea and vomiting that is not controlled by your nausea medication, call the clinic.   BELOW ARE SYMPTOMS THAT SHOULD BE REPORTED IMMEDIATELY:  *FEVER GREATER THAN 100.5 F  *CHILLS WITH OR WITHOUT FEVER  NAUSEA AND VOMITING THAT IS NOT CONTROLLED WITH YOUR NAUSEA MEDICATION  *UNUSUAL SHORTNESS OF BREATH  *UNUSUAL BRUISING OR BLEEDING  TENDERNESS IN MOUTH AND THROAT WITH OR WITHOUT PRESENCE OF ULCERS  *URINARY PROBLEMS  *BOWEL PROBLEMS  UNUSUAL RASH Items with * indicate a potential emergency and should be followed up as soon as possible.  Feel free to call the clinic you have any questions or concerns. The clinic phone number is [phone].  

## 2015-09-17 ENCOUNTER — Emergency Department (HOSPITAL_COMMUNITY)
Admission: EM | Admit: 2015-09-17 | Discharge: 2015-09-17 | Disposition: A | Discharge status: Home / Self Care | Payer: Medicaid Other | Attending: Emergency Medicine | Admitting: Emergency Medicine

## 2015-09-17 ENCOUNTER — Encounter (HOSPITAL_COMMUNITY): Payer: Self-pay | Admitting: Emergency Medicine

## 2015-09-17 DIAGNOSIS — Z79899 Other long term (current) drug therapy: Secondary | ICD-10-CM | POA: Insufficient documentation

## 2015-09-17 DIAGNOSIS — L233 Allergic contact dermatitis due to drugs in contact with skin: Secondary | ICD-10-CM | POA: Diagnosis not present

## 2015-09-17 DIAGNOSIS — I10 Essential (primary) hypertension: Secondary | ICD-10-CM | POA: Insufficient documentation

## 2015-09-17 DIAGNOSIS — Z87891 Personal history of nicotine dependence: Secondary | ICD-10-CM | POA: Insufficient documentation

## 2015-09-17 DIAGNOSIS — Z8742 Personal history of other diseases of the female genital tract: Secondary | ICD-10-CM | POA: Insufficient documentation

## 2015-09-17 DIAGNOSIS — Z853 Personal history of malignant neoplasm of breast: Secondary | ICD-10-CM | POA: Insufficient documentation

## 2015-09-17 DIAGNOSIS — L259 Unspecified contact dermatitis, unspecified cause: Secondary | ICD-10-CM

## 2015-09-17 DIAGNOSIS — Z7952 Long term (current) use of systemic steroids: Secondary | ICD-10-CM | POA: Diagnosis not present

## 2015-09-17 DIAGNOSIS — Z86718 Personal history of other venous thrombosis and embolism: Secondary | ICD-10-CM | POA: Diagnosis not present

## 2015-09-17 DIAGNOSIS — Z9889 Other specified postprocedural states: Secondary | ICD-10-CM | POA: Insufficient documentation

## 2015-09-17 DIAGNOSIS — T494X5A Adverse effect of keratolytics, keratoplastics, and other hair treatment drugs and preparations, initial encounter: Secondary | ICD-10-CM | POA: Diagnosis not present

## 2015-09-17 DIAGNOSIS — D509 Iron deficiency anemia, unspecified: Secondary | ICD-10-CM | POA: Insufficient documentation

## 2015-09-17 DIAGNOSIS — R21 Rash and other nonspecific skin eruption: Secondary | ICD-10-CM | POA: Diagnosis present

## 2015-09-17 NOTE — ED Provider Notes (Signed)
CSN: [id number]     Arrival date & time 09/17/15  2121 History   First MD Initiated Contact with Patient 09/17/15 2224     Chief Complaint  Patient presents with  . Rash     (Consider location/radiation/quality/duration/timing/severity/associated sxs/prior Treatment) HPI Comments: Patient here with pruritic rash around her mouth that began after she used Blistex. She purchases from the [employer]. Denies any rashes on her body. No hives. No trouble swallowing. Denies any dyspnea. Symptoms persistent and no treatment use prior to arrival. Denies any oral involvement  Patient is a 44 y.o. female presenting with rash. The history is provided by the patient.  Rash   Past Medical History  Diagnosis Date  . Endometriosis   . Hypertension   . Anemia   . Wears glasses   . Iron deficiency anemia, unspecified 02/25/2014  . BRCA1 positive     c.190T>G (p.Cys64Gly) @ [facility]   . History of blood transfusion     last 9'15. due to chemotherapy.  Marland Kitchen DVT (deep venous thrombosis) (HCC)     Right Subclavian and IJ.. Lovenox stopped 01-10-15 until after surgery planned 02-08-15.  . Fibroid tumor     02-02-15 remains with abdominal pain and some vaginal bleeding due to fibroids.  . Breast cancer ([place]) 02/01/14    ER-/PR-/Her2+, still receiving chemo 12/21/14, last Herceptin 02-02-15.Left breat cancer.  . Shortness of breath      when Hemoglobin low, now resolved after transfusions 9'15.  . Rash 02/22/2015  . PONV (postoperative nausea and vomiting)     needs scop patch   Past Surgical History  Procedure Laterality Date  . Cervical polypectomy  2010  . Cesarean section      one previous  . Tubal ligation    . Portacath placement Right 02/23/2014    Procedure: INSERTION PORT-A-CATH;  Surgeon: Joyice Faster. Cornett, MD;  Location: [place];  Service: General;  Laterality: Right;  . Port-a-cath removal N/A 05/21/2014    Procedure: REMOVAL PORT-A-CATH;  Surgeon: Zenovia Jarred, MD;  Location:  [place];  Service: General;  Laterality: N/A;  . Tee without cardioversion N/A 05/26/2014    Procedure: TRANSESOPHAGEAL ECHOCARDIOGRAM (TEE);  Surgeon: Larey Dresser, MD;  Location: [facility] ENDOSCOPY;  Service: Cardiovascular;  Laterality: N/A;  . Bilateral total mastectomy with axillary lymph node dissection Bilateral 07/28/2014    Procedure: BILATERAL TOTAL MASTECTOMY WITH LEFT AXILLARY SENTINEL LYMPH NODE BIOPSY;  Surgeon: Stark Klein, MD;  Location: [place];  Service: General;  Laterality: Bilateral;  . Breast reconstruction with placement of tissue expander and flex hd (acellular hydrated dermis) Bilateral 07/28/2014    Procedure: BILATERAL BREAST RECONSTRUCTION WITH PLACEMENT OF TISSUE EXPANDER AND FLEX HD (ACELLULAR HYDRATED DERMIS);  Surgeon: Theodoro Kos, DO;  Location: [place];  Service: Plastics;  Laterality: Bilateral;  . Removal of tissue expander and placement of implant Bilateral 10/14/2014    Procedure: REMOVAL OF BILATERAL BREAST  TISSUE EXPANDER  WITH PLASCEMENT OF BILATERAL  BREAST IMPLANTS;  Surgeon: Theodoro Kos, DO;  Location: [place];  Service: Plastics;  Laterality: Bilateral;  . Left heart catheterization with coronary angiogram N/A 09/13/2014    Procedure: LEFT HEART CATHETERIZATION WITH CORONARY ANGIOGRAM;  Surgeon: Jolaine Artist, MD;  Location: [facility] CATH LAB;  Service: Cardiovascular;  Laterality: N/A;  . Cardiac catheterization      10'15- Dr. Sung Amabile  . Robotic assisted total hysterectomy with bilateral salpingo oopherectomy Bilateral 02/08/2015    Procedure: ROBOTIC ASSISTED TOTAL HYSTERECTOMY  WITH BILATERAL SALPINGO OOPHORECTOMY; UTERUS WEIGHING GREATER THAN 250 GRAMS;  Surgeon: Everitt Amber, MD;  Location: [facility];  Service: Gynecology;  Laterality: Bilateral;  BRCA 1 GENE MUTATION  . Breast reconstruction Bilateral 03/31/2015    Procedure: BILATERAL BREAST RECONSTRUCTION REVISION WITH BILATERAL LIPOFILLING;  Surgeon: Theodoro Kos, DO;  Location: [place];  Service: Plastics;  Laterality: Bilateral;  . Liposuction with lipofilling Bilateral 03/31/2015    Procedure: LIPOSUCTION WITH LIPOFILLING;  Surgeon: Theodoro Kos, DO;  Location: [place];  Service: Plastics;  Laterality: Bilateral;   Family History  Problem Relation Age of Onset  . Breast cancer Mother 88    currently 29  . Diabetes Father   . Pancreatic cancer Father 55  . Breast cancer Paternal Aunt 46    currently 24; BRCA1 positive  . Stroke Maternal Grandfather   . Cancer Paternal Aunt     unk. primary; deceased 56s  . Breast cancer Cousin     daughter of unaffected paternal aunt; dx in her 49s   Social History  Substance Use Topics  . Smoking status: Former Smoker -- 0.25 packs/day for 15 years    Quit date: 02/18/2009  . Smokeless tobacco: Former Systems developer  . Alcohol Use: No     Comment: occasional   OB History    Gravida Para Term Preterm AB TAB SAB Ectopic Multiple Living   5 5 5       5      Review of Systems  Skin: Positive for rash.  All other systems reviewed and are negative.     Allergies  Compazine  Home Medications   Prior to Admission medications   Medication Sig Start Date End Date Taking? Authorizing Provider  acetaminophen (TYLENOL) 325 MG tablet Take 2 tablets (650 mg total) by mouth every 6 (six) hours as needed for mild pain (or Temp > 100). 07/30/14   Shawn M Rayburn, PA-C  Biotin 5000 MCG TABS Take 10,000 mcg by mouth daily.    Historical Provider, MD  carvedilol (COREG) 12.5 MG tablet Take 1.5 tablets (18.75 mg total) by mouth 2 (two) times daily with a meal. 03/01/15   Jolaine Artist, MD  diphenhydrAMINE (BENADRYL) 25 MG tablet Take 1 tablet (25 mg total) by mouth every 6 (six) hours. Patient taking differently: Take 25 mg by mouth every 6 (six) hours as needed for sleep (rash).  07/10/15   Delsa Grana, PA-C  DULoxetine (CYMBALTA) 60 MG capsule Take 1 capsule (60 mg total) by mouth daily. 04/11/15   Tresa Garter, MD  ferrous sulfate 325 (65 FE) MG tablet Take 1 tablet (325 mg total) by mouth 3 (three) times daily with meals. 04/11/15   Tresa Garter, MD  hydrALAZINE (APRESOLINE) 50 MG tablet Take 1 tablet (50 mg total) by mouth every 8 (eight) hours. 03/01/15   Jolaine Artist, MD  hydrochlorothiazide (MICROZIDE) 12.5 MG capsule Take 1 capsule (12.5 mg total) by mouth daily. Patient taking differently: Take 12.5 mg by mouth daily as needed (high blood pressure).  03/01/15   Jolaine Artist, MD  ibuprofen (ADVIL,MOTRIN) 800 MG tablet Take 800 mg by mouth every 8 (eight) hours as needed (pain).    Historical Provider, MD  lisinopril (PRINIVIL,ZESTRIL) 40 MG tablet Take 1 tablet (40 mg total) by mouth daily. 03/01/15   Jolaine Artist, MD  LORazepam (ATIVAN) 0.5 MG tablet Take 0.5 mg by mouth every 8 (eight) hours as needed for anxiety or sleep.  Historical Provider, MD  ondansetron (ZOFRAN) 8 MG tablet Take 8 mg by mouth 2 (two) times daily as needed for nausea or vomiting.     Historical Provider, MD  oxyCODONE-acetaminophen (PERCOCET/ROXICET) 5-325 MG per tablet Take 1-2 tablets by mouth every 4 (four) hours as needed for severe pain. 08/16/15   Charlesetta Shanks, MD  PRESCRIPTION MEDICATION Chemo at [facility]    Historical Provider, MD  triamcinolone (KENALOG) 0.025 % cream Apply 1 application topically 2 (two) times daily. Patient not taking: Reported on 08/26/2015 07/10/15   Delsa Grana, PA-C   BP 133/78 mmHg  Pulse 74  Temp(Src) 97.7 F (36.5 C) (Oral)  Resp 16  Ht 5' 5"  (1.651 m)  Wt 215 lb (97.523 kg)  BMI 35.78 kg/m2  SpO2 98%  LMP 02/19/2014 Physical Exam  Constitutional: She is oriented to person, place, and time. She appears well-developed and well-nourished.  Non-toxic appearance. No distress.  HENT:  Head: Normocephalic and atraumatic.  Mouth/Throat:    Eyes: Conjunctivae, EOM and lids are normal. Pupils are equal, round, and reactive to light.  Neck: Normal range of  motion. Neck supple. No tracheal deviation present. No thyroid mass present.  Cardiovascular: Normal rate, regular rhythm and normal heart sounds.  Exam reveals no gallop.   No murmur heard. Pulmonary/Chest: Effort normal and breath sounds normal. No stridor. No respiratory distress. She has no decreased breath sounds. She has no wheezes. She has no rhonchi. She has no rales.  Abdominal: Soft. Normal appearance and bowel sounds are normal. She exhibits no distension. There is no tenderness. There is no rebound and no CVA tenderness.  Musculoskeletal: Normal range of motion. She exhibits no edema or tenderness.  Neurological: She is alert and oriented to person, place, and time. She has normal strength. No cranial nerve deficit or sensory deficit. GCS eye subscore is 4. GCS verbal subscore is 5. GCS motor subscore is 6.  Skin: Skin is warm and dry. No abrasion and no rash noted.  Psychiatric: She has a normal mood and affect. Her speech is normal and behavior is normal.  Nursing note and vitals reviewed.   ED Course  Procedures (including critical care time) Labs Review Labs Reviewed - No data to display  Imaging Review No results found. I have personally reviewed and evaluated these images and lab results as part of my medical decision-making.   EKG Interpretation None      MDM   Final diagnoses:  None    Patient with likely contact dermatitis. Stable for discharge instructed to stop using Blistex use Benadryl when necessary    Lacretia Leigh, MD 09/17/15 2248

## 2015-09-17 NOTE — ED Notes (Signed)
Pt arrrived to the ED with a complaint of a rash around her mouth.  Pt is a cancer patient and received chemotherapy on Thursday.  The rash began the same time as the treatment.  Pt states it makes her lips feel numb.

## 2015-09-17 NOTE — ED Notes (Signed)
MD at bedside. 

## 2015-09-17 NOTE — ED Notes (Signed)
Pt alert, oriented, and ambulatory upon DC. She was advised to stop using the blistex and use benadryl.  She was encourage to follow up with PCP and come back if she worsens.

## 2015-09-17 NOTE — Discharge Instructions (Signed)
Stop using the Blistex and use Benadryl as directed  Contact Dermatitis Dermatitis is redness, soreness, and swelling (inflammation) of the skin. Contact dermatitis is a reaction to certain substances that touch the skin. There are two types of contact dermatitis:   Irritant contact dermatitis. This type is caused by something that irritates your skin, such as dry hands from washing them too much. This type does not require previous exposure to the substance for a reaction to occur. This type is more common.  Allergic contact dermatitis. This type is caused by a substance that you are allergic to, such as a nickel allergy or poison ivy. This type only occurs if you have been exposed to the substance (allergen) before. Upon a repeat exposure, your body reacts to the substance. This type is less common. CAUSES  Many different substances can cause contact dermatitis. Irritant contact dermatitis is most commonly caused by exposure to:   Makeup.   Soaps.   Detergents.   Bleaches.   Acids.   Metal salts, such as nickel.  Allergic contact dermatitis is most commonly caused by exposure to:   Poisonous plants.   Chemicals.   Jewelry.   Latex.   Medicines.   Preservatives in products, such as clothing.  RISK FACTORS This condition is more likely to develop in:   People who have jobs that expose them to irritants or allergens.  People who have certain medical conditions, such as asthma or eczema.  SYMPTOMS  Symptoms of this condition may occur anywhere on your body where the irritant has touched you or is touched by you. Symptoms include:  Dryness or flaking.   Redness.   Cracks.   Itching.   Pain or a burning feeling.   Blisters.  Drainage of small amounts of blood or clear fluid from skin cracks. With allergic contact dermatitis, there may also be swelling in areas such as the eyelids, mouth, or genitals.  DIAGNOSIS  This condition is diagnosed with  a medical history and physical exam. A patch skin test may be performed to help determine the cause. If the condition is related to your job, you may need to see an occupational medicine specialist. TREATMENT Treatment for this condition includes figuring out what caused the reaction and protecting your skin from further contact. Treatment may also include:   Steroid creams or ointments. Oral steroid medicines may be needed in more severe cases.  Antibiotics or antibacterial ointments, if a skin infection is present.  Antihistamine lotion or an antihistamine taken by mouth to ease itching.  A bandage (dressing). HOME CARE INSTRUCTIONS Skin Care  Moisturize your skin as needed.   Apply cool compresses to the affected areas.  Try taking a bath with:  Epsom salts. Follow the instructions on the packaging. You can get these at your local pharmacy or grocery store.  Baking soda. Pour a small amount into the bath as directed by your health care provider.  Colloidal oatmeal. Follow the instructions on the packaging. You can get this at your local pharmacy or grocery store.  Try applying baking soda paste to your skin. Stir water into baking soda until it reaches a paste-like consistency.  Do not scratch your skin.  Bathe less frequently, such as every other day.  Bathe in lukewarm water. Avoid using hot water. Medicines  Take or apply over-the-counter and prescription medicines only as told by your health care provider.   If you were prescribed an antibiotic medicine, take or apply your antibiotic as told  by your health care provider. Do not stop using the antibiotic even if your condition starts to improve. General Instructions  Keep all follow-up visits as told by your health care provider. This is important.  Avoid the substance that caused your reaction. If you do not know what caused it, keep a journal to try to track what caused it. Write down:  What you eat.  What  cosmetic products you use.  What you drink.  What you wear in the affected area. This includes jewelry.  If you were given a dressing, take care of it as told by your health care provider. This includes when to change and remove it. SEEK MEDICAL CARE IF:   Your condition does not improve with treatment.  Your condition gets worse.  You have signs of infection such as swelling, tenderness, redness, soreness, or warmth in the affected area.  You have a fever.  You have new symptoms. SEEK IMMEDIATE MEDICAL CARE IF:   You have a severe headache, neck pain, or neck stiffness.  You vomit.  You feel very sleepy.  You notice red streaks coming from the affected area.  Your bone or joint underneath the affected area becomes painful after the skin has healed.  The affected area turns darker.  You have difficulty breathing.   This information is not intended to replace advice given to you by your health care provider. Make sure you discuss any questions you have with your health care provider.   Document Released: 11/16/2000 Document Revised: 08/10/2015 Document Reviewed: 04/06/2015 Elsevier Interactive Patient Education [employer].

## 2015-09-29 ENCOUNTER — Other Ambulatory Visit: Payer: Medicaid Other

## 2015-09-29 ENCOUNTER — Ambulatory Visit: Payer: Medicaid Other | Admitting: Internal Medicine

## 2015-10-06 ENCOUNTER — Telehealth: Payer: Self-pay | Admitting: *Deleted

## 2015-10-06 ENCOUNTER — Other Ambulatory Visit (HOSPITAL_BASED_OUTPATIENT_CLINIC_OR_DEPARTMENT_OTHER): Payer: Medicaid Other

## 2015-10-06 ENCOUNTER — Encounter: Payer: Self-pay | Admitting: *Deleted

## 2015-10-06 ENCOUNTER — Ambulatory Visit (HOSPITAL_BASED_OUTPATIENT_CLINIC_OR_DEPARTMENT_OTHER): Payer: Medicaid Other

## 2015-10-06 VITALS — BP 120/71 | HR 66 | Temp 98.1°F | Resp 18

## 2015-10-06 DIAGNOSIS — D0512 Intraductal carcinoma in situ of left breast: Secondary | ICD-10-CM | POA: Diagnosis not present

## 2015-10-06 DIAGNOSIS — C50412 Malignant neoplasm of upper-outer quadrant of left female breast: Secondary | ICD-10-CM

## 2015-10-06 DIAGNOSIS — Z5112 Encounter for antineoplastic immunotherapy: Secondary | ICD-10-CM

## 2015-10-06 LAB — CBC WITH DIFFERENTIAL/PLATELET
BASO%: 0.6 % (ref 0.0–2.0)
BASOS ABS: 0.1 10*3/uL (ref 0.0–0.1)
EOS ABS: 0.2 10*3/uL (ref 0.0–0.5)
EOS%: 2 % (ref 0.0–7.0)
HCT: 37.9 % (ref 34.8–46.6)
HEMOGLOBIN: 12.4 g/dL (ref 11.6–15.9)
LYMPH%: 14.3 % (ref 14.0–49.7)
MCH: 29 pg (ref 25.1–34.0)
MCHC: 32.7 g/dL (ref 31.5–36.0)
MCV: 88.9 fL (ref 79.5–101.0)
MONO#: 0.5 10*3/uL (ref 0.1–0.9)
MONO%: 5.3 % (ref 0.0–14.0)
NEUT#: 7.3 10*3/uL — ABNORMAL HIGH (ref 1.5–6.5)
NEUT%: 77.8 % — AB (ref 38.4–76.8)
Platelets: 273 10*3/uL (ref 145–400)
RBC: 4.26 10*6/uL (ref 3.70–5.45)
RDW: 13.3 % (ref 11.2–14.5)
WBC: 9.4 10*3/uL (ref 3.9–10.3)
lymph#: 1.3 10*3/uL (ref 0.9–3.3)

## 2015-10-06 LAB — COMPREHENSIVE METABOLIC PANEL (CC13)
ALBUMIN: 3.8 g/dL (ref 3.5–5.0)
ALK PHOS: 118 U/L (ref 40–150)
ALT: 17 U/L (ref 0–55)
AST: 13 U/L (ref 5–34)
Anion Gap: 6 mEq/L (ref 3–11)
BUN: 17.2 mg/dL (ref 7.0–26.0)
CHLORIDE: 111 meq/L — AB (ref 98–109)
CO2: 24 mEq/L (ref 22–29)
Calcium: 9.7 mg/dL (ref 8.4–10.4)
Creatinine: 0.9 mg/dL (ref 0.6–1.1)
EGFR: 87 mL/min/{1.73_m2} — AB (ref >90)
GLUCOSE: 130 mg/dL (ref 70–140)
POTASSIUM: 3.8 meq/L (ref 3.5–5.1)
SODIUM: 141 meq/L (ref 136–145)
Total Bilirubin: 0.34 mg/dL (ref 0.20–1.20)
Total Protein: 6.8 g/dL (ref 6.4–8.3)

## 2015-10-06 IMAGING — CR DG CHEST 2V
2 series · 2 of 2 positions shown · non-contrast
Comparison: PA and lateral chest x-ray April 13, 2014.

CLINICAL DATA: Mid chest pain for 4 days, history of breast
malignancy.

EXAM:
CHEST  2 VIEW

[w chest pa]
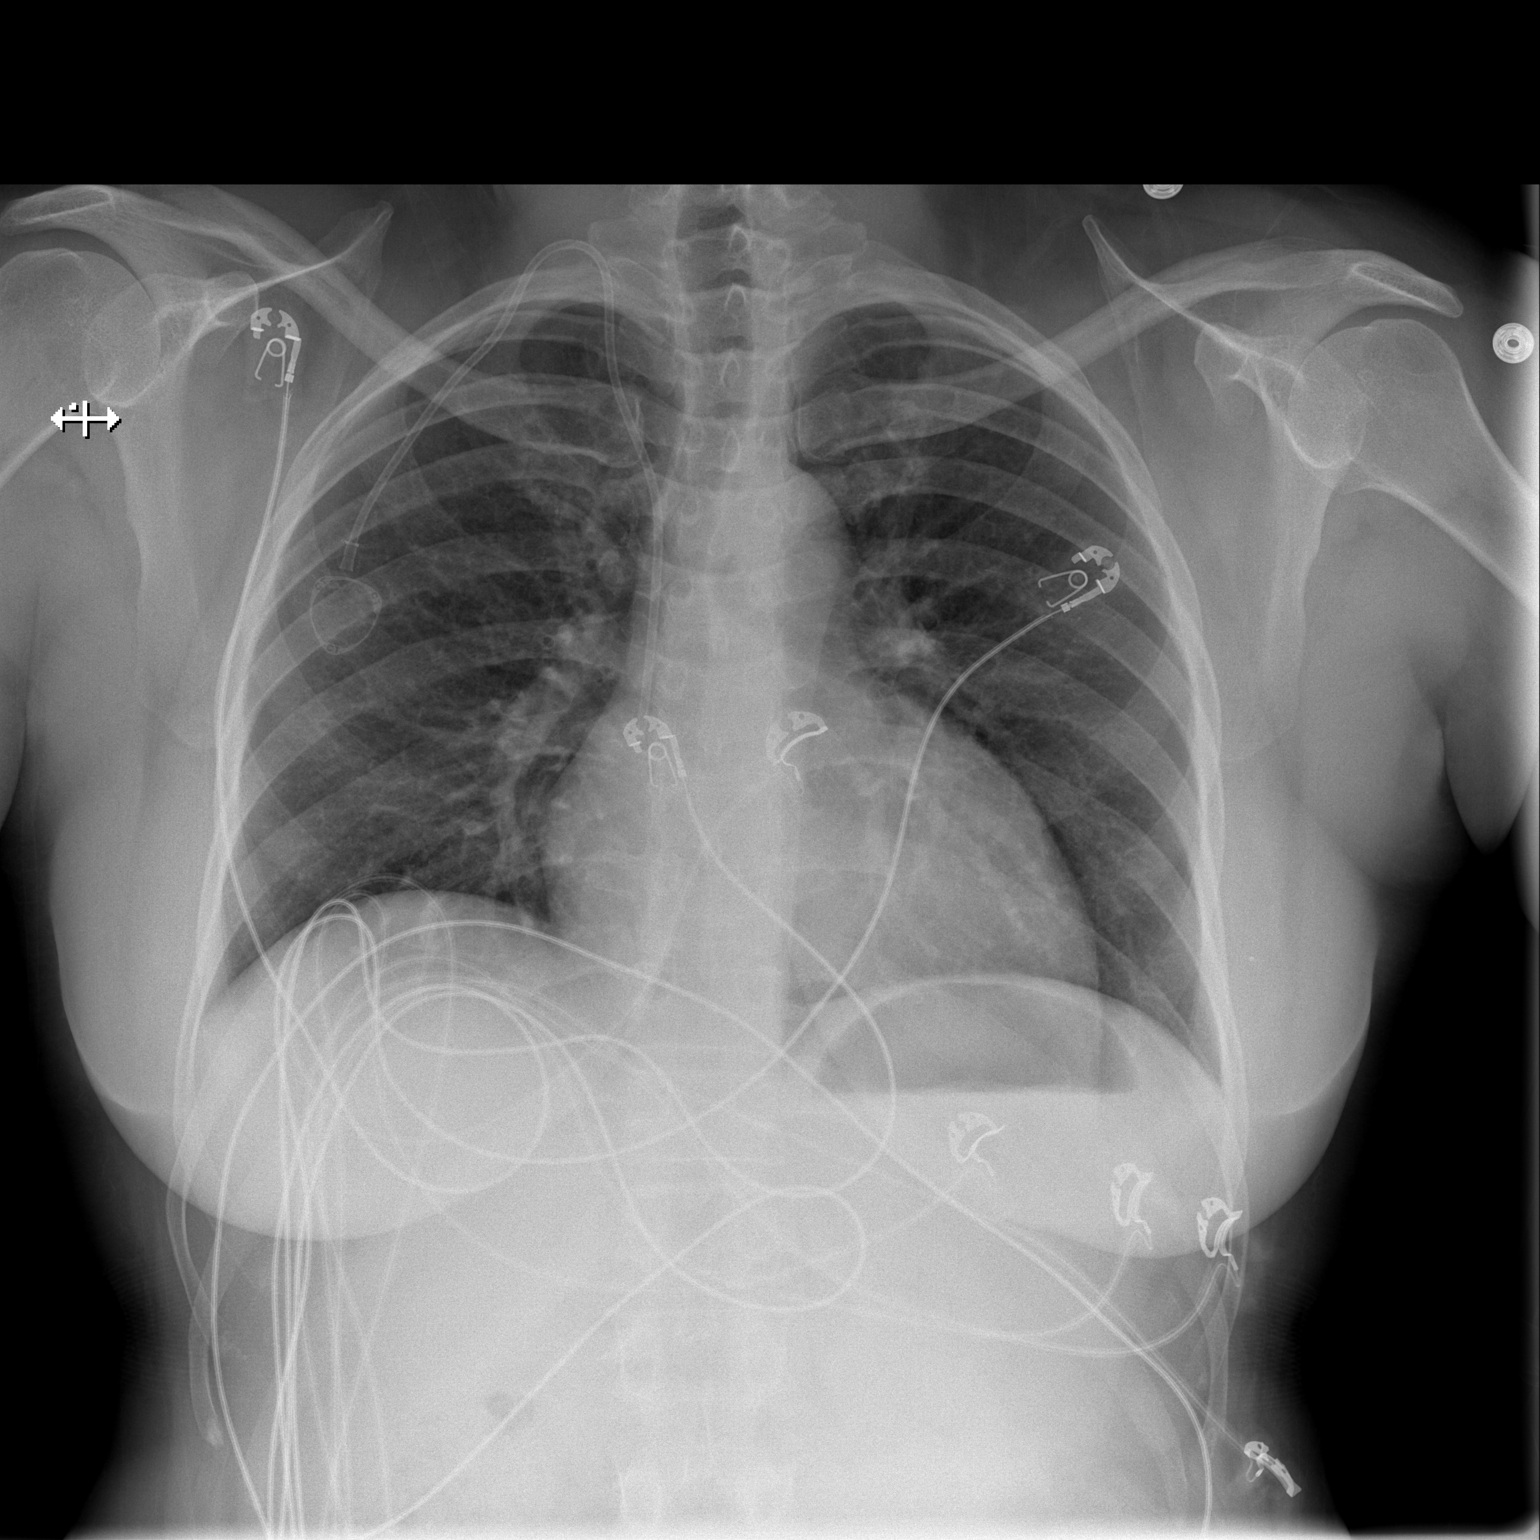

[w chest lat]
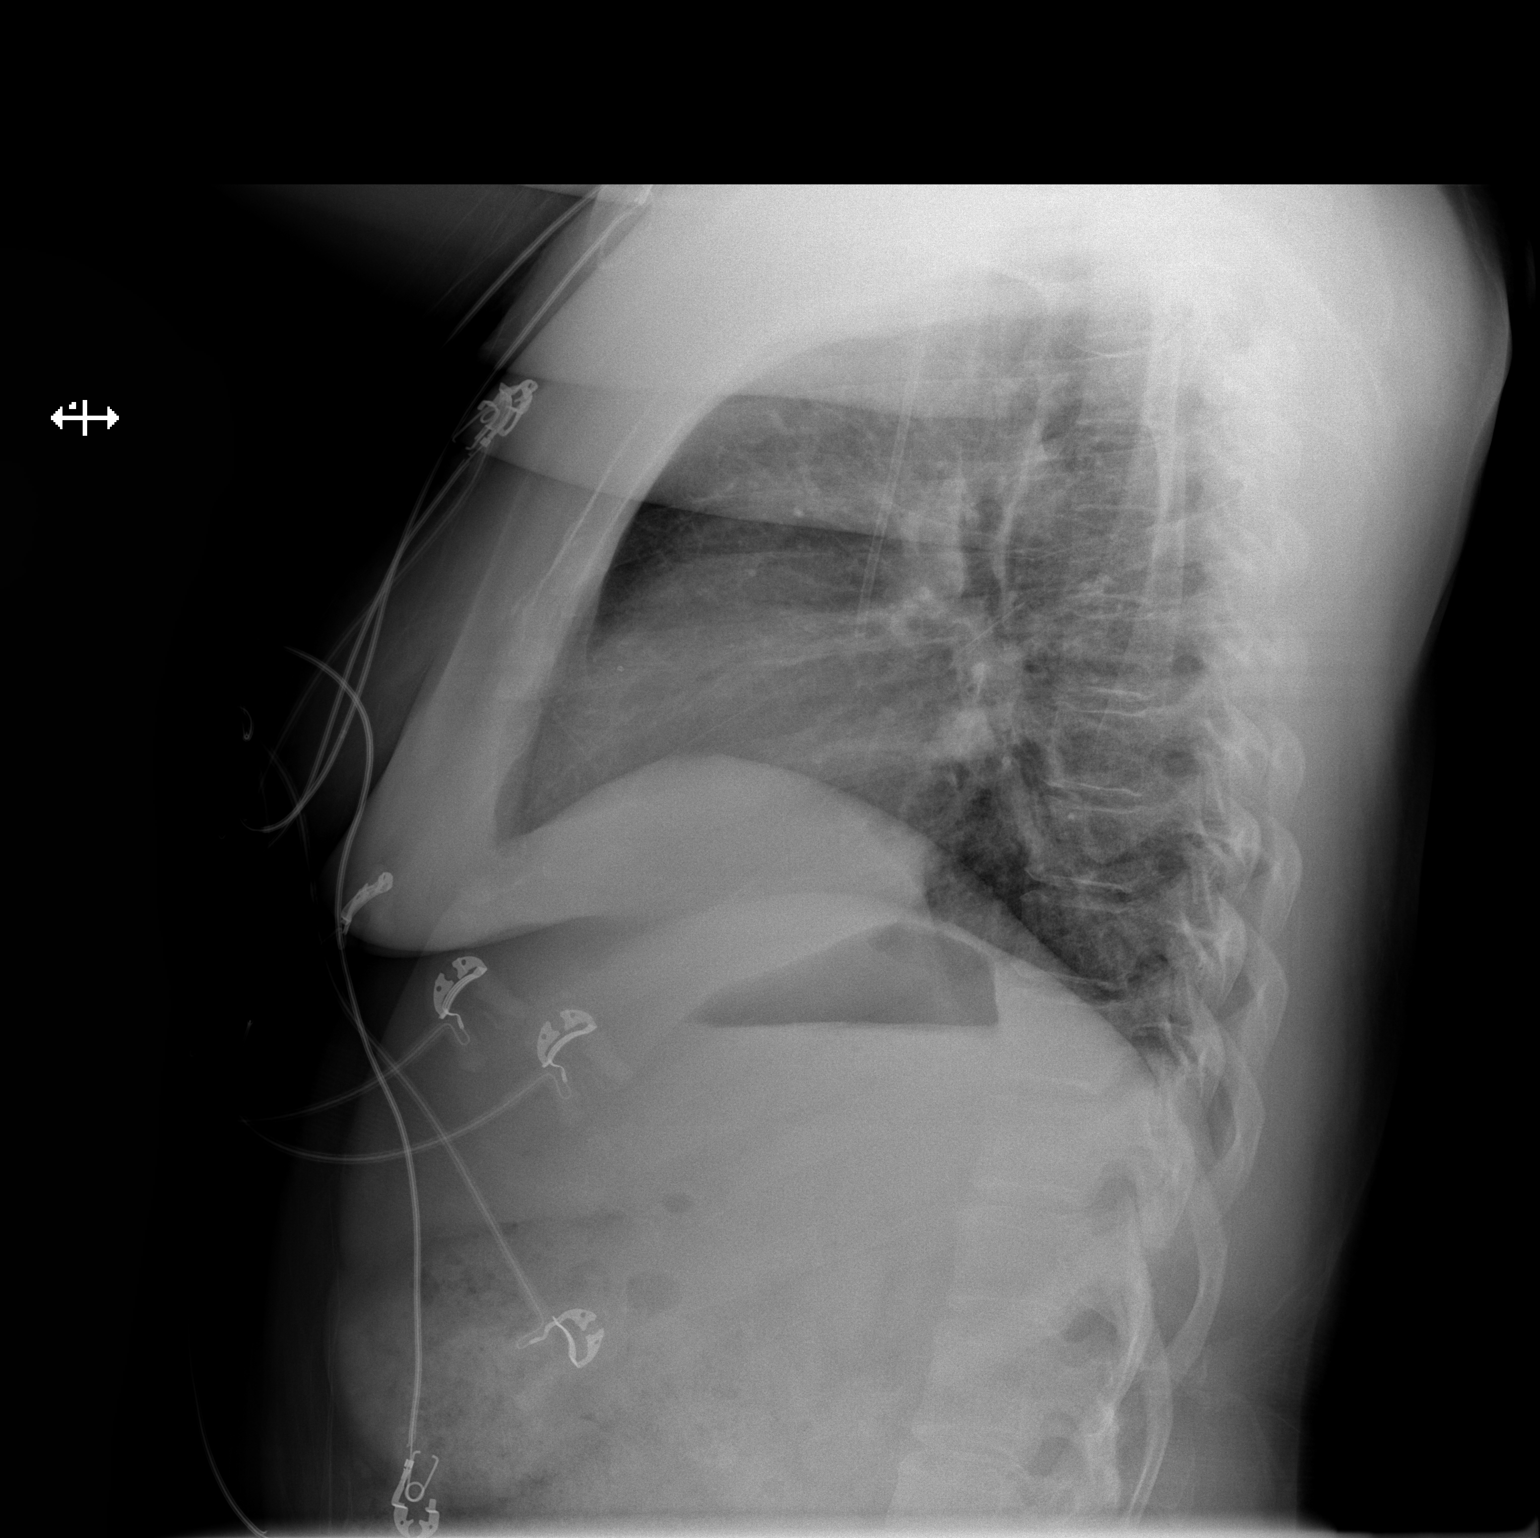

[2 of 2 positions shown; findings below may reference images not displayed]

FINDINGS: The lungs are adequately inflated. There is no focal infiltrate. The
heart is top-normal in size but stable. The pulmonary vascularity is
not engorged. The Port-A-Cath appliance is unchanged. There is no
pleural effusion. The bony thorax is unremarkable.
IMPRESSION: There is no acute cardiopulmonary disease.

## 2015-10-06 IMAGING — CT CT ANGIO CHEST
1 of 2 series · 19 of 32 positions shown · IV contrast (OMNIPAQUE 300)
Comparison: Chest radiograph May 11, 2012 and chest CT April 13, 2014

CLINICAL DATA: Shortness of breath and chest pain

EXAM:
CT ANGIOGRAPHY CHEST WITH CONTRAST
TECHNIQUE: Multidetector CT imaging of the chest was performed using the
standard protocol during bolus administration of intravenous
contrast. Multiplanar CT image reconstructions and MIPs were
obtained to evaluate the vascular anatomy.
CONTRAST:  100mL OMNIPAQUE IOHEXOL 350 MG/ML SOLN

[Series 6: thins for pacs · axial · 0.59mm/px · z∈[+1455,+1656]mm · 19 of 225 slices shown]
[im 12/225  lung]
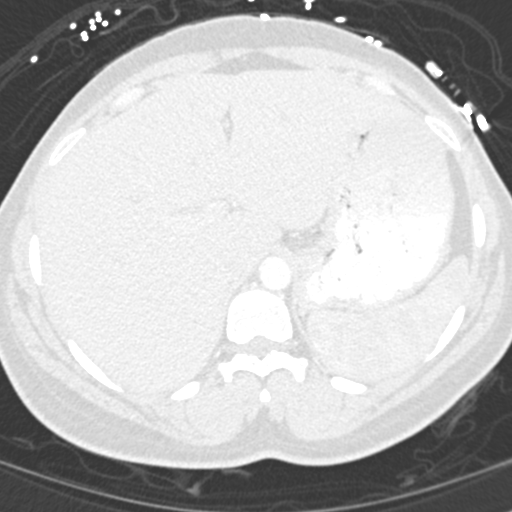
[im 23/225  mediastinal]
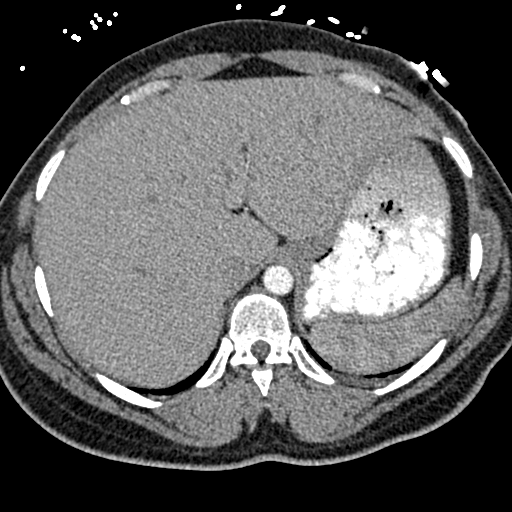
[im 34/225  lung]
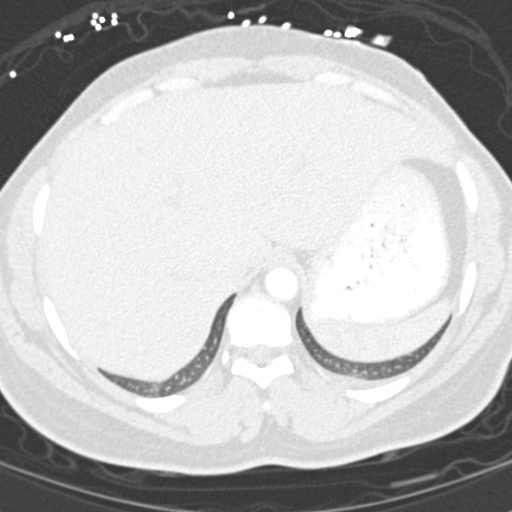
[im 57/225  mediastinal]
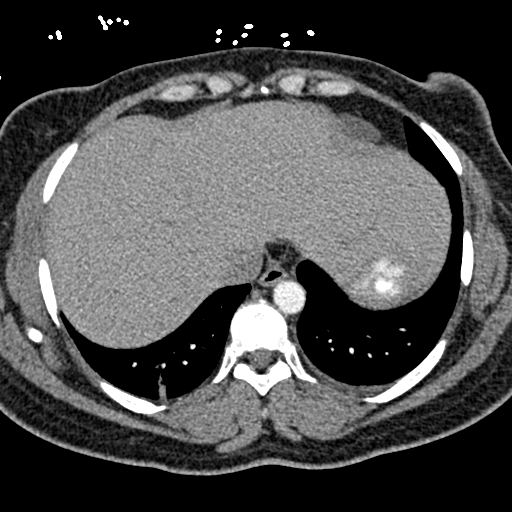
[im 68/225  lung]
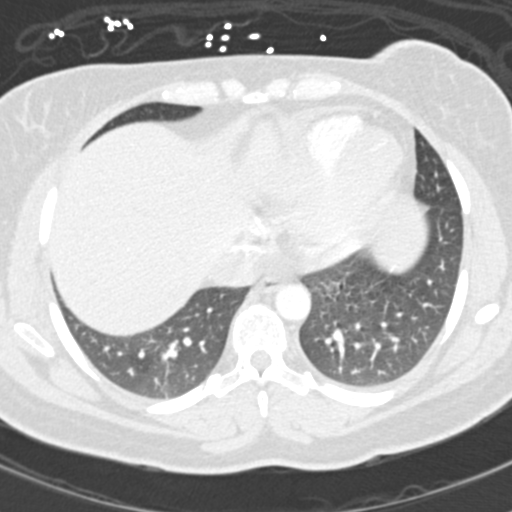
[im 75/225  mediastinal]
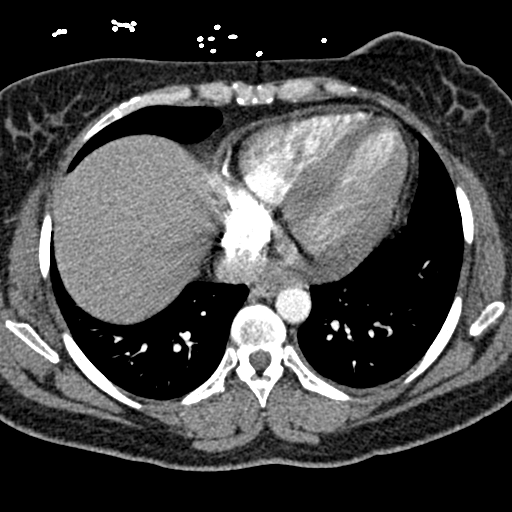
[im 79/225  lung]
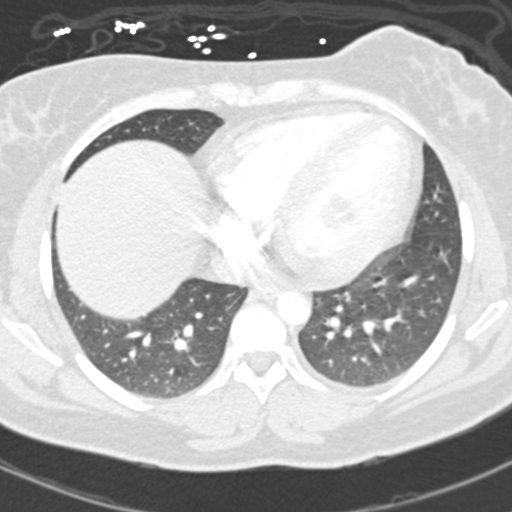
[im 90/225  mediastinal]
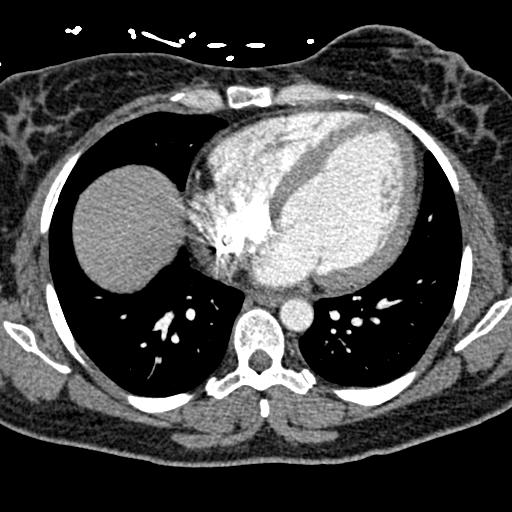
[im 101/225  lung]
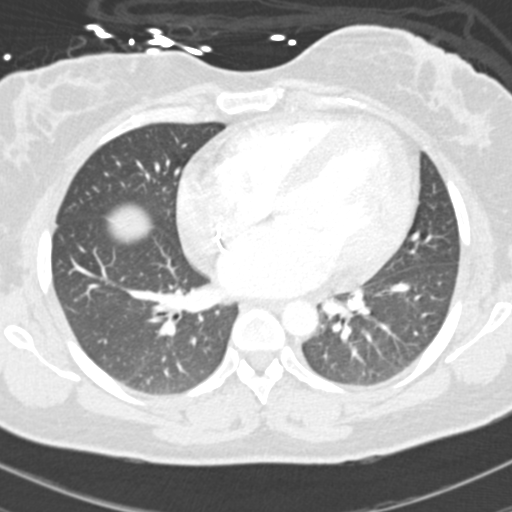
[im 113/225  mediastinal]
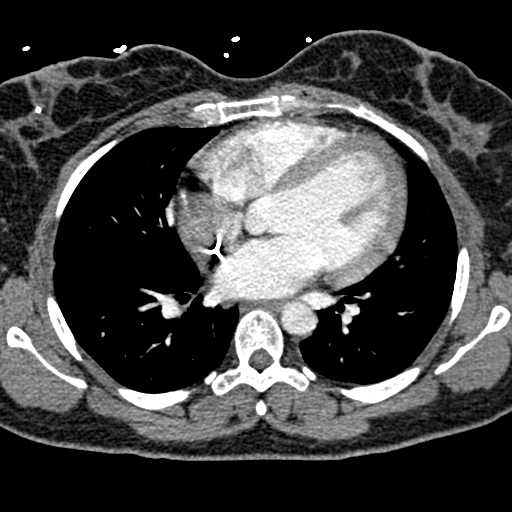
[im 124/225  lung]
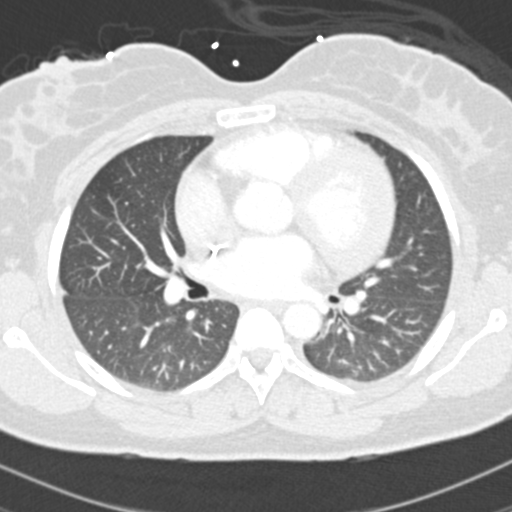
[im 135/225  mediastinal]
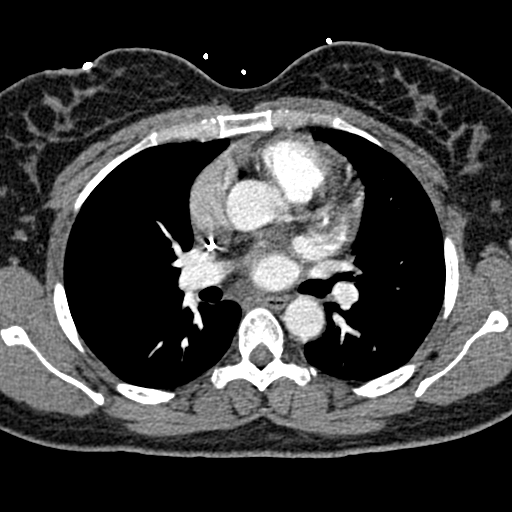
[im 146/225  lung]
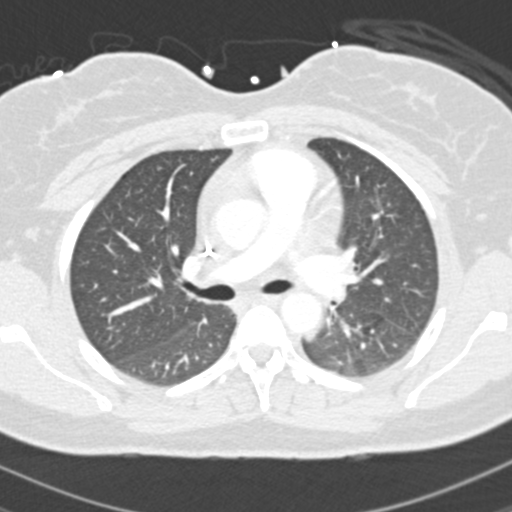
[im 150/225  mediastinal]
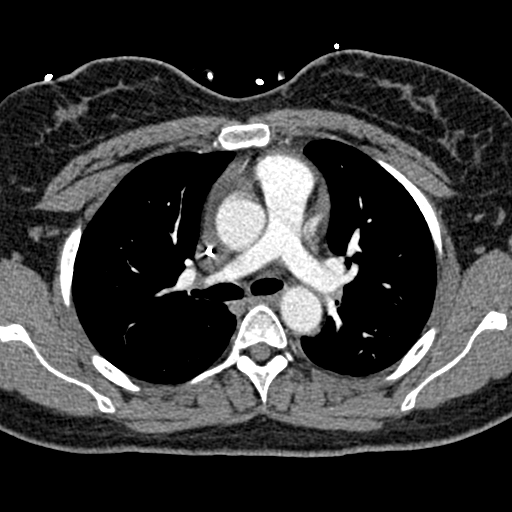
[im 157/225  lung]
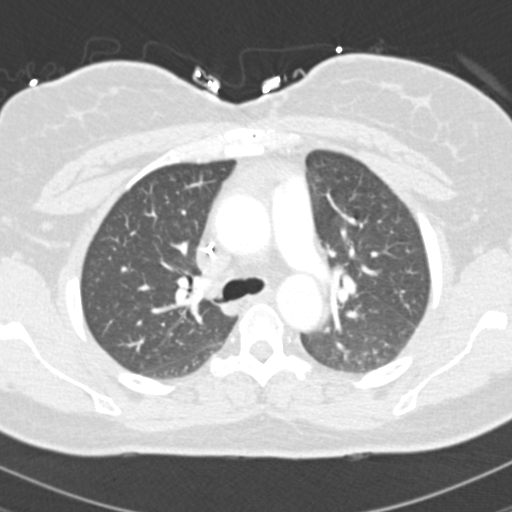
[im 169/225  mediastinal]
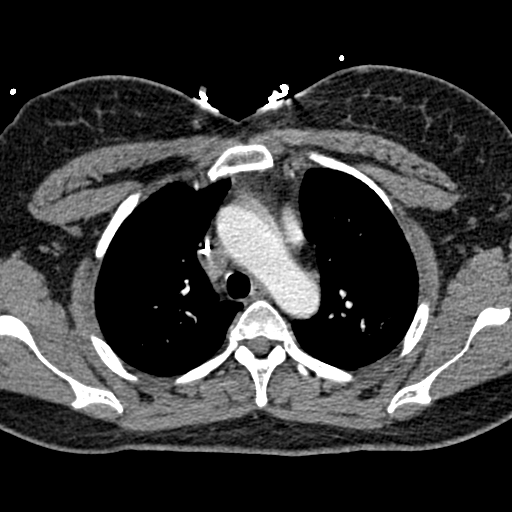
[im 191/225  lung]
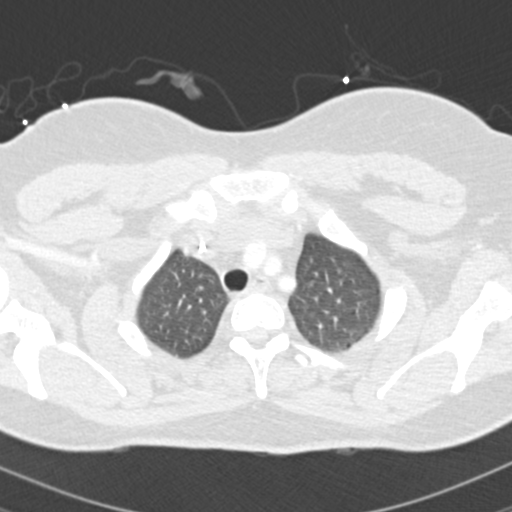
[im 202/225  mediastinal]
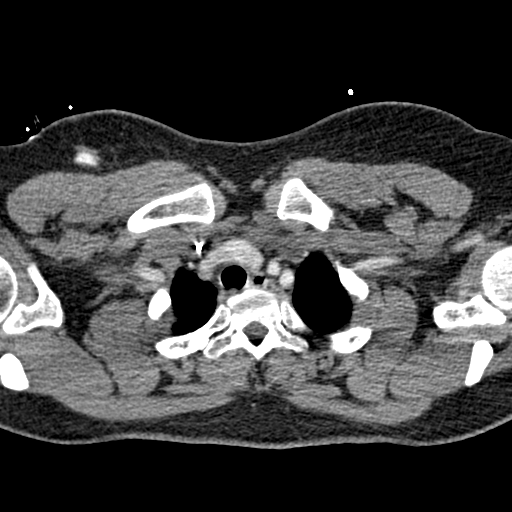
[im 213/225  lung]
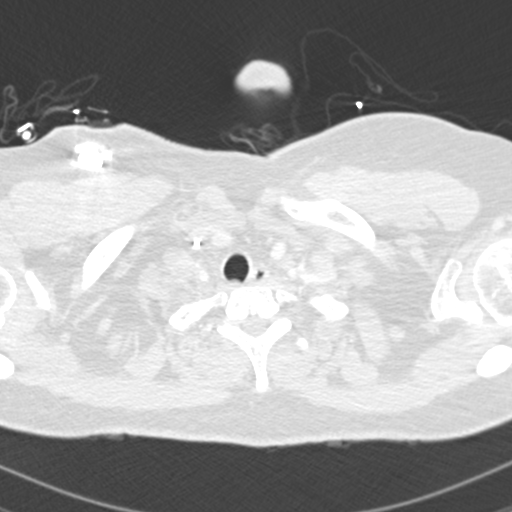

[19 of 32 positions shown; findings below may reference images not displayed]

FINDINGS: There is no demonstrable pulmonary embolus. There is no thoracic
aortic aneurysm or dissection.

There has been slight partial clearing of infiltrate from the
posterior right base compared to most recent prior CT examination.
Some residual opacity remains in the posterior segment right lower
lobe. The previously noted nodular opacity posterior left base
currently measures 6 x 3 mm, smaller than on the previous study. It
is seen on axial slice 39, series 7. There is no new nodular opacity
or airspace consolidation apparent. There is a minimal bullous
disease in the right lower lobe.

Central catheter tip is in the right atrium. There is no appreciable
pneumothorax.

There is no appreciable thoracic adenopathy. The pericardium is
slightly thickened inferiorly, a stable finding. Heart is slightly
enlarged.

Visualized upper abdominal structures appear unremarkable. There are
no blastic or lytic bone lesions.

Review of the MIP images confirms the above findings.
IMPRESSION: No demonstrable pulmonary embolus.

The area of previously noted infiltrate in the posterior right base
has shown mild partial but incomplete clearing. There is still
residual consolidation in a small portion of the posterior segment
of the right lower lobe.

The nodular opacity in the posterior left base has become slightly
smaller.

No new areas of infiltrate or nodularity identified.  No adenopathy.

Heart is prominent but stable. No appreciable pericardial effusion.
Slight inferior pericardial thickening is stable.

## 2015-10-06 MED ORDER — ACETAMINOPHEN 325 MG PO TABS
ORAL_TABLET | ORAL | Status: AC
  Filled 2015-10-06: qty 2

## 2015-10-06 MED ORDER — TRASTUZUMAB CHEMO INJECTION 440 MG
6.0000 mg/kg | Freq: Once | INTRAVENOUS | Status: AC
  Administered 2015-10-06: 567 mg via INTRAVENOUS
  Filled 2015-10-06: qty 27

## 2015-10-06 MED ORDER — DIPHENHYDRAMINE HCL 25 MG PO CAPS
25.0000 mg | ORAL_CAPSULE | Freq: Once | ORAL | Status: AC
  Administered 2015-10-06: 25 mg via ORAL

## 2015-10-06 MED ORDER — ACETAMINOPHEN 325 MG PO TABS
650.0000 mg | ORAL_TABLET | Freq: Once | ORAL | Status: AC
  Administered 2015-10-06: 650 mg via ORAL

## 2015-10-06 MED ORDER — DIPHENHYDRAMINE HCL 25 MG PO CAPS
ORAL_CAPSULE | ORAL | Status: AC
  Filled 2015-10-06: qty 1

## 2015-10-06 MED ORDER — SODIUM CHLORIDE 0.9 % IV SOLN
Freq: Once | INTRAVENOUS | Status: AC
  Administered 2015-10-06: 11:00:00 via INTRAVENOUS

## 2015-10-06 NOTE — Telephone Encounter (Signed)
Called by treatment room nurse requesting " ok to treat " per most recent EF on 08/18/2015 with EF % per reading- pf 40-45%.  Per note on result by Dr Haroldine Laws at the [facility] clinic " echo personally reviewed with Dr Aundra Dubin - we feel EF 50-55%. Discussed with pt at OV ".  Above reviewed with Dr Jana Hakim who agreed pt is ok to proceed with treatment.  This RN called above to covering RN in treatment room.

## 2015-10-06 NOTE — Patient Instructions (Signed)
[facility] Discharge Instructions for Patients  Today you received the following: Herceptin   To help prevent nausea and vomiting after your treatment, we encourage you to take your nausea medication as directed.   If you develop nausea and vomiting that is not controlled by your nausea medication, call the clinic.   BELOW ARE SYMPTOMS THAT SHOULD BE REPORTED IMMEDIATELY:  *FEVER GREATER THAN 100.5 F  *CHILLS WITH OR WITHOUT FEVER  NAUSEA AND VOMITING THAT IS NOT CONTROLLED WITH YOUR NAUSEA MEDICATION  *UNUSUAL SHORTNESS OF BREATH  *UNUSUAL BRUISING OR BLEEDING  TENDERNESS IN MOUTH AND THROAT WITH OR WITHOUT PRESENCE OF ULCERS  *URINARY PROBLEMS  *BOWEL PROBLEMS  UNUSUAL RASH Items with * indicate a potential emergency and should be followed up as soon as possible.  Feel free to call the clinic you have any questions or concerns. The clinic phone number is [phone].  Please show the CHEMO ALERT CARD at check-in to the Emergency Department and triage nurse.   

## 2015-10-06 NOTE — Progress Notes (Signed)
Per Dr. Virgie Dad nurse Tivis Ringer, okay to treat with Echo from 08/19/15 with EF of 40-45%

## 2015-10-10 ENCOUNTER — Encounter: Payer: Self-pay | Admitting: Genetic Counselor

## 2015-10-10 DIAGNOSIS — Z1379 Encounter for other screening for genetic and chromosomal anomalies: Secondary | ICD-10-CM | POA: Insufficient documentation

## 2015-10-19 ENCOUNTER — Telehealth: Payer: Self-pay | Admitting: Nurse Practitioner

## 2015-10-19 ENCOUNTER — Telehealth: Payer: Self-pay | Admitting: Internal Medicine

## 2015-10-19 NOTE — Telephone Encounter (Signed)
Patient would like a copy of her TB test results faxed to [phone], with attn to [facility]

## 2015-10-19 NOTE — Telephone Encounter (Signed)
Left message for patient re LTS visit 12/9. Other appointments remain the same. Schedule mailed.

## 2015-10-20 ENCOUNTER — Other Ambulatory Visit: Payer: Medicaid Other

## 2015-10-24 NOTE — Telephone Encounter (Signed)
Patient verified DOB Patient made aware of results being faxed to her employer per patients request. Patient will also have a hard copy at the front desk for her pickup. Patient had no further questions.

## 2015-10-28 ENCOUNTER — Ambulatory Visit (HOSPITAL_BASED_OUTPATIENT_CLINIC_OR_DEPARTMENT_OTHER): Payer: Medicaid Other

## 2015-10-28 ENCOUNTER — Encounter: Payer: Self-pay | Admitting: *Deleted

## 2015-10-28 ENCOUNTER — Other Ambulatory Visit (HOSPITAL_BASED_OUTPATIENT_CLINIC_OR_DEPARTMENT_OTHER): Payer: Medicaid Other

## 2015-10-28 VITALS — BP 151/94 | HR 107 | Temp 98.1°F | Resp 18

## 2015-10-28 DIAGNOSIS — Z5112 Encounter for antineoplastic immunotherapy: Secondary | ICD-10-CM

## 2015-10-28 DIAGNOSIS — C50412 Malignant neoplasm of upper-outer quadrant of left female breast: Secondary | ICD-10-CM | POA: Diagnosis not present

## 2015-10-28 LAB — CBC WITH DIFFERENTIAL/PLATELET
BASO%: 0.7 % (ref 0.0–2.0)
BASOS ABS: 0.1 10*3/uL (ref 0.0–0.1)
EOS ABS: 0.3 10*3/uL (ref 0.0–0.5)
EOS%: 2.6 % (ref 0.0–7.0)
HEMATOCRIT: 37.7 % (ref 34.8–46.6)
HEMOGLOBIN: 12.2 g/dL (ref 11.6–15.9)
LYMPH#: 2.3 10*3/uL (ref 0.9–3.3)
LYMPH%: 22.6 % (ref 14.0–49.7)
MCH: 28.8 pg (ref 25.1–34.0)
MCHC: 32.3 g/dL (ref 31.5–36.0)
MCV: 89 fL (ref 79.5–101.0)
MONO#: 0.6 10*3/uL (ref 0.1–0.9)
MONO%: 5.7 % (ref 0.0–14.0)
NEUT#: 6.8 10*3/uL — ABNORMAL HIGH (ref 1.5–6.5)
NEUT%: 68.4 % (ref 38.4–76.8)
Platelets: 327 10*3/uL (ref 145–400)
RBC: 4.24 10*6/uL (ref 3.70–5.45)
RDW: 13.2 % (ref 11.2–14.5)
WBC: 10 10*3/uL (ref 3.9–10.3)

## 2015-10-28 LAB — COMPREHENSIVE METABOLIC PANEL (CC13)
ALBUMIN: 3.7 g/dL (ref 3.5–5.0)
ALK PHOS: 105 U/L (ref 40–150)
ALT: 17 U/L (ref 0–55)
ANION GAP: 8 meq/L (ref 3–11)
AST: 15 U/L (ref 5–34)
BUN: 19.9 mg/dL (ref 7.0–26.0)
CALCIUM: 9.6 mg/dL (ref 8.4–10.4)
CHLORIDE: 107 meq/L (ref 98–109)
CO2: 26 mEq/L (ref 22–29)
Creatinine: 1.1 mg/dL (ref 0.6–1.1)
EGFR: 74 mL/min/{1.73_m2} — AB (ref >90)
Glucose: 94 mg/dl (ref 70–140)
POTASSIUM: 4.3 meq/L (ref 3.5–5.1)
Sodium: 141 mEq/L (ref 136–145)
Total Bilirubin: <0.3 mg/dL (ref 0.20–1.20)
Total Protein: 6.8 g/dL (ref 6.4–8.3)

## 2015-10-28 MED ORDER — ACETAMINOPHEN 325 MG PO TABS
ORAL_TABLET | ORAL | Status: AC
  Filled 2015-10-28: qty 2

## 2015-10-28 MED ORDER — TRASTUZUMAB CHEMO INJECTION 440 MG
6.0000 mg/kg | Freq: Once | INTRAVENOUS | Status: AC
  Administered 2015-10-28: 567 mg via INTRAVENOUS
  Filled 2015-10-28: qty 27

## 2015-10-28 MED ORDER — SODIUM CHLORIDE 0.9 % IV SOLN
Freq: Once | INTRAVENOUS | Status: AC
  Administered 2015-10-28: 11:00:00 via INTRAVENOUS

## 2015-10-28 MED ORDER — ACETAMINOPHEN 325 MG PO TABS
650.0000 mg | ORAL_TABLET | Freq: Once | ORAL | Status: AC
  Administered 2015-10-28: 650 mg via ORAL

## 2015-10-28 MED ORDER — DIPHENHYDRAMINE HCL 25 MG PO CAPS
ORAL_CAPSULE | ORAL | Status: AC
  Filled 2015-10-28: qty 1

## 2015-10-28 MED ORDER — DIPHENHYDRAMINE HCL 25 MG PO CAPS
25.0000 mg | ORAL_CAPSULE | Freq: Once | ORAL | Status: AC
  Administered 2015-10-28: 25 mg via ORAL

## 2015-10-28 NOTE — Patient Instructions (Signed)
[facility] Discharge Instructions for Patients  Today you received the following: Herceptin   To help prevent nausea and vomiting after your treatment, we encourage you to take your nausea medication as directed.   If you develop nausea and vomiting that is not controlled by your nausea medication, call the clinic.   BELOW ARE SYMPTOMS THAT SHOULD BE REPORTED IMMEDIATELY:  *FEVER GREATER THAN 100.5 F  *CHILLS WITH OR WITHOUT FEVER  NAUSEA AND VOMITING THAT IS NOT CONTROLLED WITH YOUR NAUSEA MEDICATION  *UNUSUAL SHORTNESS OF BREATH  *UNUSUAL BRUISING OR BLEEDING  TENDERNESS IN MOUTH AND THROAT WITH OR WITHOUT PRESENCE OF ULCERS  *URINARY PROBLEMS  *BOWEL PROBLEMS  UNUSUAL RASH Items with * indicate a potential emergency and should be followed up as soon as possible.  Feel free to call the clinic you have any questions or concerns. The clinic phone number is [phone].  Please show the CHEMO ALERT CARD at check-in to the Emergency Department and triage nurse.   

## 2015-11-10 ENCOUNTER — Other Ambulatory Visit: Payer: Medicaid Other

## 2015-11-11 ENCOUNTER — Other Ambulatory Visit: Payer: Self-pay | Admitting: Nurse Practitioner

## 2015-11-11 ENCOUNTER — Ambulatory Visit (HOSPITAL_BASED_OUTPATIENT_CLINIC_OR_DEPARTMENT_OTHER): Payer: Medicaid Other | Admitting: Nurse Practitioner

## 2015-11-11 ENCOUNTER — Encounter: Payer: Self-pay | Admitting: Nurse Practitioner

## 2015-11-11 ENCOUNTER — Ambulatory Visit (HOSPITAL_COMMUNITY)
Admission: RE | Admit: 2015-11-11 | Discharge: 2015-11-11 | Disposition: A | Discharge status: Home / Self Care | Payer: Medicaid Other | Source: Ambulatory Visit | Attending: Nurse Practitioner | Admitting: Nurse Practitioner

## 2015-11-11 ENCOUNTER — Telehealth: Payer: Self-pay | Admitting: Oncology

## 2015-11-11 VITALS — BP 147/83 | HR 71 | Temp 98.4°F | Resp 16 | Ht 66.0 in | Wt 215.8 lb

## 2015-11-11 DIAGNOSIS — M546 Pain in thoracic spine: Secondary | ICD-10-CM | POA: Insufficient documentation

## 2015-11-11 DIAGNOSIS — N393 Stress incontinence (female) (male): Secondary | ICD-10-CM

## 2015-11-11 DIAGNOSIS — M79605 Pain in left leg: Secondary | ICD-10-CM | POA: Insufficient documentation

## 2015-11-11 DIAGNOSIS — C50912 Malignant neoplasm of unspecified site of left female breast: Secondary | ICD-10-CM

## 2015-11-11 DIAGNOSIS — M545 Low back pain: Secondary | ICD-10-CM

## 2015-11-11 DIAGNOSIS — C50412 Malignant neoplasm of upper-outer quadrant of left female breast: Secondary | ICD-10-CM

## 2015-11-11 DIAGNOSIS — R200 Anesthesia of skin: Secondary | ICD-10-CM

## 2015-11-11 DIAGNOSIS — N8189 Other female genital prolapse: Secondary | ICD-10-CM

## 2015-11-11 NOTE — Telephone Encounter (Signed)
Gave and printed appt sched and avs fo rpt for DEC and Jan...Marland KitchenMarland Kitchenlvm for Dr. Tempie Hoist office

## 2015-11-11 NOTE — Progress Notes (Signed)
CLINIC:  Cancer Survivorship   REASON FOR VISIT:  Routine follow-up post-treatment for history of breast cancer.  BRIEF ONCOLOGIC HISTORY:    Breast cancer of upper-outer quadrant of left female breast ([place])   01/08/2014 Mammogram Left breast: 3 cm breast mass, 7 cm from the nipple   01/08/2014 Breast US Left breast: irregularly marginated hypoechoic mass with increased vascularity located at 2 o'clock position, 7 cm from the nipple corresponding to the palpable and mammographic finding. This measures 3.2 x 2.6 x 2.0 cm   01/29/2014 Initial Biopsy Left breast core needle bx: 2:00 lesion, 7 CFN: IDC, DCIS, +LVI; 2:00, 9CFM: IDC, DCIS.  Left axillary LN: positive for malignancy.  ER- (0%), PR- (0%), HER2/neu amplified (ratio 5.1), Ki67 79-100%.   02/07/2014 Breast MRI Two enhancing masses in the upper-outer quadrant of the left breast with enlarged axillary adenopathy corresponding with the patient's known areas of invasive ductal carcinoma and DCIS as well as axillary metastasis.    02/07/2014 Clinical Stage Stage IIB: T2 N1   02/22/2014 Procedure [facility] panel and was found to have a pathogenic mutation in the BRCA1 gene called c.190T>G (p.Cys64Gly   02/22/2014 Procedure Right breast core needle bx: negative for malignancy   02/25/2014 - 06/30/2014 Neo-Adjuvant Chemotherapy Docetaxel, carboplatin, trastuzumab, and pertuzumab (3/26-7/29/15) followed by maintenance trastuzumab   06/30/2014 -  Chemotherapy Maintenace trastuzumab.  Held 07/2014 due to decreased EF=  resumed 11/09/2014; continued decline; d/c'd 02/22/15; resumed 08/04/15 after discussion with Dr. Haroldine Laws   07/07/2014 Breast MRI no remaining abnormal enhancement or mass seen the left breast. No suspicious findings on either side   07/28/2014 Definitive Surgery Bilateral mastectomy/[facility] Barry Dienes) with implant placement: no evidence of residual malignancy in left breast; negative in right breast. 3 Left axillary LN removed and no evidence of  malignancy (0/3). two benign LN from right.   07/28/2014 Pathologic Stage ypT0 ypN0   10/14/2014 Surgery Removal of bilateral breast tissue expander with placement of bilateral breast implants ([facility])    02/08/2015 Surgery Bilateral salpingo-oophorectomy and hysterectomy with benign pathology   03/31/2015 Surgery Breast reconstruction revision with bilateral lipofilling ([facility])    INTERVAL HISTORY:  [name] presents to the [facility] today for ongoing follow up regarding her history of breast cancer. Overall, Tamara [name] reports feeling doing pretty well since her last visit. She has resumed her trastuzumab (as of 08/04/2015) and is tolerating it well.  She thinks that she had an appointment for ECHO and to see Dr. Haroldine Laws in November but missed it due to changes in her phone number. She continues with shortness of breath with long distances, and states that this is no worse.  She remains fatigued.  She continues with intermittent chest pain, which has been worked up extensively.  She denies any change along her reconstruction and states that she will see Dr. Marla Roe next month for additional lipofilling.  She has noticed some numbness along the lateral aspect of her left thigh which moves down from her hip.  When asked, she states that she also has some intermittent back pain. She denies any numbness or tingling in her feet or calves.  She has experienced some stress incontinence following her TAH-BSO and it is no worse.  She denies any headache or cough  She reports a good appetite and denies any weight loss.    REVIEW OF SYSTEMS:  General: Occasional night sweats. Denies fever, chills, or unintentional weight loss. HEENT: Wears glasses.  Denies visual changes, hearing loss, mouth sores,  or difficulty swallowing. Cardiac: Intermittent chest pain as above. Denies palpitations and lower extremity edema.  Respiratory: Dyspnea as above. Denies wheeze. Breast: Denies any new nodularity or  masses along her reconstruction. GI: Denies abdominal pain, constipation, diarrhea, nausea, or vomiting.  GU: Stress incontinence since surgery. Denies dysuria, hematuria, vaginal bleeding, vaginal discharge, or vaginal dryness.  Musculoskeletal: Back pain as above, intermittent..  Neuro: Numbness at left thigh, as above, otherwise denies recent fall or numbness / tingling in her extremities. Skin: Denies rash, pruritis, or open wounds.  Psych: Some anxiety now nearing end of treatment that is interfering with her sleep. Denies depression or memory loss.   A 14-point review of systems was completed and was negative, except as noted above.   ONCOLOGY TREATMENT TEAM:  1. Surgeon:  Dr. Barry Dienes at [facility]  2. Medical Oncologist: Dr. Jana Hakim 3. Plastics: Dr. Marla Roe    PAST MEDICAL/SURGICAL HISTORY:  Past Medical History  Diagnosis Date  . Endometriosis   . Hypertension   . Anemia   . Wears glasses   . Iron deficiency anemia, unspecified 02/25/2014  . BRCA1 positive     c.190T>G (p.Cys64Gly) @ [facility]   . History of blood transfusion     last 9'15. due to chemotherapy.  Marland Kitchen DVT (deep venous thrombosis) (HCC)     Right Subclavian and IJ.. Lovenox stopped 01-10-15 until after surgery planned 02-08-15.  . Fibroid tumor     02-02-15 remains with abdominal pain and some vaginal bleeding due to fibroids.  . Breast cancer ([place]) 02/01/14    ER-/PR-/Her2+, still receiving chemo 12/21/14, last Herceptin 02-02-15.Left breat cancer.  . Shortness of breath      when Hemoglobin low, now resolved after transfusions 9'15.  . Rash 02/22/2015  . PONV (postoperative nausea and vomiting)     needs scop patch   Past Surgical History  Procedure Laterality Date  . Cervical polypectomy  2010  . Cesarean section      one previous  . Tubal ligation    . Portacath placement Right 02/23/2014    Procedure: INSERTION PORT-A-CATH;  Surgeon: Joyice Faster. Cornett, MD;  Location: [facility];   Service: General;  Laterality: Right;  . Port-a-cath removal N/A 05/21/2014    Procedure: REMOVAL PORT-A-CATH;  Surgeon: Zenovia Jarred, MD;  Location: [facility];  Service: General;  Laterality: N/A;  . Tee without cardioversion N/A 05/26/2014    Procedure: TRANSESOPHAGEAL ECHOCARDIOGRAM (TEE);  Surgeon: Larey Dresser, MD;  Location: [facility] ENDOSCOPY;  Service: Cardiovascular;  Laterality: N/A;  . Bilateral total mastectomy with axillary lymph node dissection Bilateral 07/28/2014    Procedure: BILATERAL TOTAL MASTECTOMY WITH LEFT AXILLARY SENTINEL LYMPH NODE BIOPSY;  Surgeon: Stark Klein, MD;  Location: [facility];  Service: General;  Laterality: Bilateral;  . Breast reconstruction with placement of tissue expander and flex hd (acellular hydrated dermis) Bilateral 07/28/2014    Procedure: BILATERAL BREAST RECONSTRUCTION WITH PLACEMENT OF TISSUE EXPANDER AND FLEX HD (ACELLULAR HYDRATED DERMIS);  Surgeon: Theodoro Kos, DO;  Location: [facility];  Service: Plastics;  Laterality: Bilateral;  . Removal of tissue expander and placement of implant Bilateral 10/14/2014    Procedure: REMOVAL OF BILATERAL BREAST  TISSUE EXPANDER  WITH PLASCEMENT OF BILATERAL  BREAST IMPLANTS;  Surgeon: Theodoro Kos, DO;  Location: [facility];  Service: Plastics;  Laterality: Bilateral;  . Left heart catheterization with coronary angiogram N/A 09/13/2014    Procedure: LEFT HEART CATHETERIZATION WITH CORONARY ANGIOGRAM;  Surgeon: Shaune Pascal Bensimhon,  MD;  Location: [facility] CATH LAB;  Service: Cardiovascular;  Laterality: N/A;  . Cardiac catheterization      10'15- Dr. Sung Amabile  . Robotic assisted total hysterectomy with bilateral salpingo oopherectomy Bilateral 02/08/2015    Procedure: ROBOTIC ASSISTED TOTAL HYSTERECTOMY WITH BILATERAL SALPINGO OOPHORECTOMY; UTERUS WEIGHING GREATER THAN 250 GRAMS;  Surgeon: Everitt Amber, MD;  Location: [facility];  Service: Gynecology;  Laterality: Bilateral;  BRCA 1 GENE MUTATION  . Breast reconstruction  Bilateral 03/31/2015    Procedure: BILATERAL BREAST RECONSTRUCTION REVISION WITH BILATERAL LIPOFILLING;  Surgeon: Theodoro Kos, DO;  Location: [place];  Service: Plastics;  Laterality: Bilateral;  . Liposuction with lipofilling Bilateral 03/31/2015    Procedure: LIPOSUCTION WITH LIPOFILLING;  Surgeon: Theodoro Kos, DO;  Location: [facility];  Service: Plastics;  Laterality: Bilateral;     ALLERGIES:  Allergies  Allergen Reactions  . Compazine [Prochlorperazine Edisylate] Other (See Comments)    Stuttering     CURRENT MEDICATIONS:  Current Outpatient Prescriptions on File Prior to Visit  Medication Sig Dispense Refill  . acetaminophen (TYLENOL) 325 MG tablet Take 2 tablets (650 mg total) by mouth every 6 (six) hours as needed for mild pain (or Temp > 100).    . Biotin 5000 MCG TABS Take 10,000 mcg by mouth daily.    . carvedilol (COREG) 12.5 MG tablet Take 1.5 tablets (18.75 mg total) by mouth 2 (two) times daily with a meal. 90 tablet 3  . DULoxetine (CYMBALTA) 60 MG capsule Take 1 capsule (60 mg total) by mouth daily. 30 capsule 3  . ferrous sulfate 325 (65 FE) MG tablet Take 1 tablet (325 mg total) by mouth 3 (three) times daily with meals. 270 tablet 3  . hydrALAZINE (APRESOLINE) 50 MG tablet Take 1 tablet (50 mg total) by mouth every 8 (eight) hours. 90 tablet 3  . hydrochlorothiazide (MICROZIDE) 12.5 MG capsule Take 1 capsule (12.5 mg total) by mouth daily. (Patient taking differently: Take 12.5 mg by mouth daily as needed (high blood pressure). ) 90 capsule 3  . ibuprofen (ADVIL,MOTRIN) 800 MG tablet Take 800 mg by mouth every 8 (eight) hours as needed (pain).    Marland Kitchen lisinopril (PRINIVIL,ZESTRIL) 40 MG tablet Take 1 tablet (40 mg total) by mouth daily. 90 tablet 3  . oxyCODONE-acetaminophen (PERCOCET/ROXICET) 5-325 MG per tablet Take 1-2 tablets by mouth every 4 (four) hours as needed for severe pain. 20 tablet 0  . diphenhydrAMINE (BENADRYL) 25 MG  tablet Take 1 tablet (25 mg total) by mouth every 6 (six) hours. (Patient not taking: Reported on 11/11/2015) 20 tablet 0  . LORazepam (ATIVAN) 0.5 MG tablet Take 0.5 mg by mouth every 8 (eight) hours as needed for anxiety or sleep.    Marland Kitchen ondansetron (ZOFRAN) 8 MG tablet Take 8 mg by mouth 2 (two) times daily as needed for nausea or vomiting.     Marland Kitchen PRESCRIPTION MEDICATION Chemo at [facility]     No current facility-administered medications on file prior to visit.     ONCOLOGIC FAMILY HISTORY:  Family History  Problem Relation Age of Onset  . Breast cancer Mother 73    currently 61  . Diabetes Father   . Pancreatic cancer Father 92  . Breast cancer Paternal Aunt 75    currently 61; BRCA1 positive  . Stroke Maternal Grandfather   . Cancer Paternal Aunt     unk. primary; deceased 71s  . Breast cancer Cousin     daughter of unaffected paternal aunt;  dx in her 23s     GENETIC COUNSELING/TESTING: Yes, performed 02/22/2014 reveals BRCA1 mutation.  SOCIAL HISTORY:  Truly Stankiewicz is separated and lives with her family in [place], [place].  She has 5 children. [name] is currently not working.  She is a former smoker and denies any current or history of alcohol or illicit drug use.     PHYSICAL EXAMINATION:  Vital Signs: Filed Vitals:   11/11/15 1311  BP: 147/83  Pulse: 71  Temp: 98.4 F (36.9 C)  Resp: 16   ECOG performance status: 1 General: Well-nourished, well-appearing female in no acute distress.  She is unaccompanied in clinic today.   HEENT: Head is atraumatic and normocephalic.  Pupils equal and reactive to light and accomodation. Conjunctivae clear without exudate.  Sclerae anicteric. Oral mucosa is pink, moist, and intact without lesions.  Oropharynx is pink without lesions or erythema.  Lymph: No cervical, supraclavicular, infraclavicular, or axillary lymphadenopathy noted on palpation.  Cardiovascular: Regular rate and rhythm without murmurs, rubs, or  gallops. Respiratory: Clear to auscultation bilaterally. Chest expansion symmetric without accessory muscle use on inspiration or expiration.  Breast: Bilateral breast exam performed.  Bilateral breast reconstruction with good cosmesis.  No mass or nodularity. GI: Abdomen soft and round. No tenderness to palpation. Bowel sounds normoactive in 4 quadrants.  GU: Deferred.  Musculoskeletal: Muscle strength 5/5 in all extremities. No tenderness to percussion along spine.   Neuro: No focal deficits. Steady gait.  Psych: Mood and affect normal and appropriate for situation.  Extremities: No edema, cyanosis, or clubbing.  Skin: Warm and dry. No open lesions noted.      ASSESSMENT AND PLAN:   1. 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 Haroldine Laws, S/P bilateral mastectomies (Dr. Barry Dienes) with placement of bilateral breast implants with revision per Dr. Marla Roe, S/P TAH-BSO (Dr. Denman George).   [name] is doing well with no clinical symptoms worrisome for cancer recurrence at this time. She is scheduled for her next (and final) dose of trastuzumab on November 17, 2015.  I have reviewed her case and status with Gentry Fitz NP, in Dr. Virgie Dad absence.  We will plan for repeat echocardiogram following this last dose of trastruzumab and have Tamara [name] to return to see Dr. Jana Hakim next month.  I have encouraged Tamara [name] to call Dr. Clayborne Dana office to reschedule the appointment that she believes that she missed. She will follow up with Dr. Marla Roe after the first of the year for additional lipofilling.  She was  instructed to make Dr. Jana Hakim or myself aware if she notes any change within her breast, any new symptoms such as pain, shortness of breath, weight loss, or fatigue.   2. Back pain / leg numbness: Based on [name]' complaints of intermittent back pain and numbness along her hip to left thigh, we will check plain films of her lumbar and thoracic spine.  I have encouraged her to keep a journal of her symptoms between now and the time that she sees Dr. Jana Hakim next month.  We will await findings of the plain films and monitor her symptoms, with additional imaging / testing pending those results.   3. Stress incontinence:  Will place referral to pelvic floor PT for evaluation of pelvic floor weakness.  4. Cancer screening:  Due to Tamara [name]'s history and her age, she should receive screening for skin cancers. She is S/P TAH-BSO. The information and recommendations were shared with the patient and in her written after visit summary.  5. Health maintenance and wellness promotion:Tamara [name] was encouraged to consume 5-7 servings of fruits and vegetables per day. We reviewed recommendations to maximize nutrition and minimize recurrence.  She was also encouraged to continue to engage in regular physical activity for 30 minutes per day most days of the week, pending Dr. Clayborne Dana recommendations.  She was instructed to limit her alcohol consumption and continue to abstain from tobacco use.  A total of 30 minutes of face-to-face time was spent with this patient with greater than 50% of that time in counseling and care-coordination.   Sylvan Cheese, NP  Survivorship Program [facility] [phone]   Note: PRIMARY CARE PROVIDER Angelica Chessman, [place] [phone]

## 2015-11-11 NOTE — Patient Instructions (Addendum)
Thank you for coming in today!  It was nice meeting you and looking forward to working with you.  As we discussed, please keep your upcoming appointments for labwork and Herceptin treatment as they are currently scheduled.  We will move your appointment with Dr. Jana Hakim to January (rather than February as it is currently scheduled). We will also call you to schedule your echocardiogram, which will be done after this next dose of Herceptin.  I will call you with the results of your xrays from today.  Continue to monitor for any change in your pain / sensation in your leg, and keep [place] posted.  Please be sure to let us know if you have any change in your condition or new symptoms.    Symptoms to Watch for and Report to Your Provider  . Changes in reconstructed breast . New or unusual pain that seems unrelated to an injury and does not go away, including back pain or bone pain . Weight loss without trying/intending . Unexplained bleeding . A rash or allergic reaction, such as swelling, severe itching or wheezing . Chills or fevers . Persistent headaches . Shortness of breath or difficulty breathing . Bloody stools or blood in your urine . Nausea, vomiting, diarrhea, loss of appetite, or trouble swallowing . A cough that does not go away . Abdominal pain . Swelling in your arms or legs . Fractures . Hot flashes or other menopausal symptoms . Any other signs mentioned by your doctor or nurse or any unusual symptoms                 that you just can't explain   NOTE: Just because you have certain symptoms, it doesn't mean the cancer has come back or you have a new cancer. Symptoms can be due to other problems that need to be addressed.  It is important to watch for these symptoms and report them to your provider so you can be medically evaluated for any of these concerns!    Living a Life of Wellness After Cancer:  *Note: Please consult your health care provider before using any medications,  supplements, over-the-counter products, or other interventions.  Also, please consult your primary care provider before you begin any lifestyle program (diet, exercise, etc.).  Your safety is our top priority and we want to make sure you continue to live a long and healthy life!    Healthy Lifestyle Recommendations  As a cancer survivor, it is important develop a lifelong commitment to a healthy lifestyle. A healthy lifestyle can prevent cancer from returning as well as prevent other diseases like heart disease, diabetes and high blood pressure.  These are some things that you can do to have a healthy lifestyle:  Marland Kitchen Maintain a healthy weight.  . Exercise daily per your doctor's orders. . Eat a balanced diet high in fruits, vegetables, bran, and fiber. Limit intake of red meat         and processed foods.  . Limit how much alcohol you consume, if at all. . Talk to your doctor about cardiovascular disease or "heart disease" screening. . Stop smoking (if you smoke). . Know your family history. . Be mindful of your emotional, social, and spiritual needs. . Meet regularly with a Primary Care Provider (PCP).  . Talk to your doctor about regular cancer screening including screening for colon             cancer and skin cancer.

## 2015-11-14 ENCOUNTER — Telehealth: Payer: Self-pay | Admitting: *Deleted

## 2015-11-14 NOTE — Telephone Encounter (Signed)
TC to pt, notified xray results are normal from 11/11/15. Encouraged pt to log symptoms into a journal/calendar to bring to Dr. Jana Hakim. Pt states she has begun this. Pt verbalized an understanding to call this office if symptoms begin to get worse or if she has any questions/concerns

## 2015-11-16 ENCOUNTER — Other Ambulatory Visit: Payer: Self-pay | Admitting: *Deleted

## 2015-11-16 DIAGNOSIS — C50412 Malignant neoplasm of upper-outer quadrant of left female breast: Secondary | ICD-10-CM

## 2015-11-17 ENCOUNTER — Telehealth: Payer: Self-pay | Admitting: *Deleted

## 2015-11-17 ENCOUNTER — Ambulatory Visit: Payer: Medicaid Other

## 2015-11-17 ENCOUNTER — Other Ambulatory Visit: Payer: Medicaid Other

## 2015-11-17 NOTE — Telephone Encounter (Signed)
TC from patient this am to cancel her lab/chemo appt for today as she has a bad headache. She wants to re-schedule. Notified Michelle in Infusion room.

## 2015-11-17 NOTE — Telephone Encounter (Signed)
I have called and left the patient a message to call the office to r/s her appts from today

## 2015-12-01 ENCOUNTER — Other Ambulatory Visit (HOSPITAL_BASED_OUTPATIENT_CLINIC_OR_DEPARTMENT_OTHER): Payer: Medicaid Other

## 2015-12-01 DIAGNOSIS — C50412 Malignant neoplasm of upper-outer quadrant of left female breast: Secondary | ICD-10-CM

## 2015-12-01 LAB — COMPREHENSIVE METABOLIC PANEL
ALBUMIN: 3.7 g/dL (ref 3.5–5.0)
ALK PHOS: 94 U/L (ref 40–150)
ALT: 12 U/L (ref 0–55)
ANION GAP: 7 meq/L (ref 3–11)
AST: 13 U/L (ref 5–34)
BILIRUBIN TOTAL: 0.34 mg/dL (ref 0.20–1.20)
BUN: 20.9 mg/dL (ref 7.0–26.0)
CALCIUM: 9.2 mg/dL (ref 8.4–10.4)
CO2: 26 meq/L (ref 22–29)
CREATININE: 1 mg/dL (ref 0.6–1.1)
Chloride: 110 mEq/L — ABNORMAL HIGH (ref 98–109)
EGFR: 78 mL/min/{1.73_m2} — AB (ref >90)
Glucose: 95 mg/dl (ref 70–140)
Potassium: 4.1 mEq/L (ref 3.5–5.1)
Sodium: 143 mEq/L (ref 136–145)
TOTAL PROTEIN: 6.9 g/dL (ref 6.4–8.3)

## 2015-12-01 LAB — CBC WITH DIFFERENTIAL/PLATELET
BASO%: 0.2 % (ref 0.0–2.0)
Basophils Absolute: 0 10*3/uL (ref 0.0–0.1)
EOS ABS: 0.2 10*3/uL (ref 0.0–0.5)
EOS%: 2.3 % (ref 0.0–7.0)
HEMATOCRIT: 38.6 % (ref 34.8–46.6)
HEMOGLOBIN: 12.3 g/dL (ref 11.6–15.9)
LYMPH#: 2.3 10*3/uL (ref 0.9–3.3)
LYMPH%: 26.8 % (ref 14.0–49.7)
MCH: 28.7 pg (ref 25.1–34.0)
MCHC: 31.9 g/dL (ref 31.5–36.0)
MCV: 90 fL (ref 79.5–101.0)
MONO#: 0.4 10*3/uL (ref 0.1–0.9)
MONO%: 4.9 % (ref 0.0–14.0)
NEUT%: 65.8 % (ref 38.4–76.8)
NEUTROS ABS: 5.5 10*3/uL (ref 1.5–6.5)
PLATELETS: 286 10*3/uL (ref 145–400)
RBC: 4.29 10*6/uL (ref 3.70–5.45)
RDW: 13.1 % (ref 11.2–14.5)
WBC: 8.4 10*3/uL (ref 3.9–10.3)

## 2015-12-02 ENCOUNTER — Other Ambulatory Visit: Payer: Self-pay | Admitting: *Deleted

## 2015-12-02 ENCOUNTER — Telehealth: Payer: Self-pay | Admitting: Oncology

## 2015-12-02 NOTE — Telephone Encounter (Signed)
Returned patient call re r/s missed chemo about from 2 weeks ago. Spoke with desk nurse and scheduled patient for herc only on 1/6. Left message for patient re 1/6 appointment and also informed her per desk nurse she is to keep scheduled appointment for lab/GM so that we can get her remaining appointments straightened out. Schedule mailed.

## 2015-12-07 ENCOUNTER — Telehealth: Payer: Self-pay | Admitting: Oncology

## 2015-12-07 ENCOUNTER — Ambulatory Visit: Payer: Medicaid Other | Admitting: Physical Therapy

## 2015-12-07 NOTE — Telephone Encounter (Signed)
Called patient per her note that she left yesterday to reschedule her herceptin due to her work schedule

## 2015-12-09 ENCOUNTER — Ambulatory Visit: Payer: Medicaid Other

## 2015-12-09 ENCOUNTER — Other Ambulatory Visit (HOSPITAL_COMMUNITY): Payer: Self-pay | Admitting: Internal Medicine

## 2015-12-12 ENCOUNTER — Ambulatory Visit (HOSPITAL_BASED_OUTPATIENT_CLINIC_OR_DEPARTMENT_OTHER): Payer: Medicaid Other

## 2015-12-12 VITALS — BP 117/66 | HR 75 | Temp 97.7°F | Resp 16

## 2015-12-12 DIAGNOSIS — C50412 Malignant neoplasm of upper-outer quadrant of left female breast: Secondary | ICD-10-CM | POA: Diagnosis not present

## 2015-12-12 DIAGNOSIS — Z5112 Encounter for antineoplastic immunotherapy: Secondary | ICD-10-CM | POA: Diagnosis not present

## 2015-12-12 MED ORDER — DIPHENHYDRAMINE HCL 25 MG PO CAPS
25.0000 mg | ORAL_CAPSULE | Freq: Once | ORAL | Status: AC
  Administered 2015-12-12: 25 mg via ORAL

## 2015-12-12 MED ORDER — DIPHENHYDRAMINE HCL 25 MG PO CAPS
ORAL_CAPSULE | ORAL | Status: AC
  Filled 2015-12-12: qty 1

## 2015-12-12 MED ORDER — SODIUM CHLORIDE 0.9 % IJ SOLN
10.0000 mL | INTRAMUSCULAR | Status: DC | PRN
  Filled 2015-12-12: qty 10

## 2015-12-12 MED ORDER — ACETAMINOPHEN 325 MG PO TABS
650.0000 mg | ORAL_TABLET | Freq: Once | ORAL | Status: AC
  Administered 2015-12-12: 650 mg via ORAL

## 2015-12-12 MED ORDER — HEPARIN SOD (PORK) LOCK FLUSH 100 UNIT/ML IV SOLN
500.0000 [IU] | Freq: Once | INTRAVENOUS | Status: DC | PRN
  Filled 2015-12-12: qty 5

## 2015-12-12 MED ORDER — SODIUM CHLORIDE 0.9 % IV SOLN
Freq: Once | INTRAVENOUS | Status: AC
  Administered 2015-12-12: 15:00:00 via INTRAVENOUS

## 2015-12-12 MED ORDER — SODIUM CHLORIDE 0.9 % IV SOLN
6.0000 mg/kg | Freq: Once | INTRAVENOUS | Status: AC
  Administered 2015-12-12: 567 mg via INTRAVENOUS
  Filled 2015-12-12: qty 27

## 2015-12-12 MED ORDER — ACETAMINOPHEN 325 MG PO TABS
ORAL_TABLET | ORAL | Status: AC
  Filled 2015-12-12: qty 2

## 2015-12-12 NOTE — Patient Instructions (Signed)
[facility] Discharge Instructions for Patients Receiving Chemotherapy  Today you received the following chemotherapy agents: Herceptin   If you develop nausea and vomiting that is not controlled by your nausea medication, call the clinic.   BELOW ARE SYMPTOMS THAT SHOULD BE REPORTED IMMEDIATELY:  *FEVER GREATER THAN 100.5 F  *CHILLS WITH OR WITHOUT FEVER  NAUSEA AND VOMITING THAT IS NOT CONTROLLED WITH YOUR NAUSEA MEDICATION  *UNUSUAL SHORTNESS OF BREATH  *UNUSUAL BRUISING OR BLEEDING  TENDERNESS IN MOUTH AND THROAT WITH OR WITHOUT PRESENCE OF ULCERS  *URINARY PROBLEMS  *BOWEL PROBLEMS  UNUSUAL RASH Items with * indicate a potential emergency and should be followed up as soon as possible.  Feel free to call the clinic should you have any questions or concerns. The clinic phone number is [phone].  Please show the CHEMO ALERT CARD at check-in to the Emergency Department and triage nurse.   

## 2015-12-13 ENCOUNTER — Ambulatory Visit (HOSPITAL_COMMUNITY): Payer: Medicaid Other

## 2015-12-13 ENCOUNTER — Encounter (HOSPITAL_COMMUNITY): Payer: Medicaid Other | Admitting: Internal Medicine

## 2015-12-14 ENCOUNTER — Ambulatory Visit: Payer: Medicaid Other | Attending: Nurse Practitioner | Admitting: Physical Therapy

## 2015-12-14 ENCOUNTER — Other Ambulatory Visit: Payer: Self-pay | Admitting: *Deleted

## 2015-12-14 DIAGNOSIS — C50412 Malignant neoplasm of upper-outer quadrant of left female breast: Secondary | ICD-10-CM

## 2015-12-15 ENCOUNTER — Other Ambulatory Visit (HOSPITAL_BASED_OUTPATIENT_CLINIC_OR_DEPARTMENT_OTHER): Payer: Medicaid Other

## 2015-12-15 ENCOUNTER — Telehealth: Payer: Self-pay | Admitting: Oncology

## 2015-12-15 ENCOUNTER — Ambulatory Visit (HOSPITAL_BASED_OUTPATIENT_CLINIC_OR_DEPARTMENT_OTHER): Payer: Medicaid Other | Admitting: Oncology

## 2015-12-15 VITALS — BP 137/87 | HR 69 | Temp 98.1°F | Resp 18 | Ht 66.0 in | Wt 215.9 lb

## 2015-12-15 DIAGNOSIS — Z1501 Genetic susceptibility to malignant neoplasm of breast: Secondary | ICD-10-CM

## 2015-12-15 DIAGNOSIS — I824Y1 Acute embolism and thrombosis of unspecified deep veins of right proximal lower extremity: Secondary | ICD-10-CM | POA: Insufficient documentation

## 2015-12-15 DIAGNOSIS — C50412 Malignant neoplasm of upper-outer quadrant of left female breast: Secondary | ICD-10-CM

## 2015-12-15 DIAGNOSIS — C50811 Malignant neoplasm of overlapping sites of right female breast: Secondary | ICD-10-CM | POA: Diagnosis not present

## 2015-12-15 DIAGNOSIS — Z86718 Personal history of other venous thrombosis and embolism: Secondary | ICD-10-CM

## 2015-12-15 DIAGNOSIS — M545 Low back pain: Secondary | ICD-10-CM

## 2015-12-15 DIAGNOSIS — M79603 Pain in arm, unspecified: Secondary | ICD-10-CM | POA: Diagnosis not present

## 2015-12-15 DIAGNOSIS — G629 Polyneuropathy, unspecified: Secondary | ICD-10-CM

## 2015-12-15 DIAGNOSIS — Z1509 Genetic susceptibility to other malignant neoplasm: Secondary | ICD-10-CM

## 2015-12-15 DIAGNOSIS — C50911 Malignant neoplasm of unspecified site of right female breast: Secondary | ICD-10-CM

## 2015-12-15 DIAGNOSIS — C50919 Malignant neoplasm of unspecified site of unspecified female breast: Secondary | ICD-10-CM | POA: Insufficient documentation

## 2015-12-15 DIAGNOSIS — D509 Iron deficiency anemia, unspecified: Secondary | ICD-10-CM

## 2015-12-15 LAB — CBC WITH DIFFERENTIAL/PLATELET
BASO%: 0.7 % (ref 0.0–2.0)
Basophils Absolute: 0.1 10*3/uL (ref 0.0–0.1)
EOS ABS: 0.2 10*3/uL (ref 0.0–0.5)
EOS%: 2.6 % (ref 0.0–7.0)
HCT: 37.2 % (ref 34.8–46.6)
HEMOGLOBIN: 12.3 g/dL (ref 11.6–15.9)
LYMPH%: 27.2 % (ref 14.0–49.7)
MCH: 28.8 pg (ref 25.1–34.0)
MCHC: 33 g/dL (ref 31.5–36.0)
MCV: 87.3 fL (ref 79.5–101.0)
MONO#: 0.6 10*3/uL (ref 0.1–0.9)
MONO%: 6.4 % (ref 0.0–14.0)
NEUT%: 63.1 % (ref 38.4–76.8)
NEUTROS ABS: 5.5 10*3/uL (ref 1.5–6.5)
Platelets: 270 10*3/uL (ref 145–400)
RBC: 4.27 10*6/uL (ref 3.70–5.45)
RDW: 13.3 % (ref 11.2–14.5)
WBC: 8.8 10*3/uL (ref 3.9–10.3)
lymph#: 2.4 10*3/uL (ref 0.9–3.3)

## 2015-12-15 LAB — COMPREHENSIVE METABOLIC PANEL
ALT: 14 U/L (ref 0–55)
ANION GAP: 9 meq/L (ref 3–11)
AST: 15 U/L (ref 5–34)
Albumin: 3.9 g/dL (ref 3.5–5.0)
Alkaline Phosphatase: 102 U/L (ref 40–150)
BUN: 17.4 mg/dL (ref 7.0–26.0)
CALCIUM: 9.1 mg/dL (ref 8.4–10.4)
CO2: 23 meq/L (ref 22–29)
CREATININE: 0.9 mg/dL (ref 0.6–1.1)
Chloride: 110 mEq/L — ABNORMAL HIGH (ref 98–109)
EGFR: 88 mL/min/{1.73_m2} — ABNORMAL LOW (ref >90)
Glucose: 104 mg/dl (ref 70–140)
Potassium: 3.4 mEq/L — ABNORMAL LOW (ref 3.5–5.1)
SODIUM: 142 meq/L (ref 136–145)
TOTAL PROTEIN: 7.1 g/dL (ref 6.4–8.3)

## 2015-12-15 NOTE — Telephone Encounter (Signed)
Appointments made and avs printed for patient °

## 2015-12-15 NOTE — Progress Notes (Signed)
ID: Tamara [name] OB: [birth year]  MR#: [id number]  CSN#:[id number]  PCP: Angelica Chessman, MD GYN:   Tamara: Dr. Erroll Luna OTHER MD: Dr. 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 There was also an 8 mm mass 8 cm from the nipple in the ipsilateral breast, as well as positive lymph nodes. 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 MRI confirmed 2.8 and 1.3 cm mass is as well as lymph node. On the right a 6 mm nodule, biopsied and negative for malignancy. PET scan 05-08-29 had hypermetabolic left breast mass consistent with known neoplasm and FDG positive left axillary lymph nodes,benign-appearing brown fat activity and muscular activity in the  neck and chest but no findings for metastatic disease involving the neck, chest, abdomen, pelvis or bones. Moderate FDG activity in the endometrial canal is thought likely due to secretory phase of ovulation or menses. No mass, uterine fibroids present. CT [facility] 02-19-14 had 3 cm left breast mass, enlarged left axillary lymph nodes are positive and no CT findings for metastatic disease involving the chest,abdomen or pelvis and no evidence of osseous metastatic disease. Mildly enlarged fibroid uterus."   Her subsequent treatment is as detailed below   INTERVAL HISTORY:   Tamara [name] returns today for follow up of her HER-2 positive  breast cancer. 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 This is very physical work and she is using her left arm little bit more than before. She wears the sleeve while at work.  REVIEW OF SYSTEMS: Tamara [name] still has minimal neuropathy involving the toe tips, not at all the hands. She does not have any left upper extremity lymphedema that she is aware of. She does have some numb feeling on the left upper leg laterally, and she does not wear tight belts although she does wear tight genes at times. She does not have similar symptoms on the right. She does have hot flashes and night sweats. She is concerned about gaining weight. Sometimes she has discomfort in her mid chest wall, but this depends on the activity she is doing. Occasionally she has a little dry cough. Chest some stress urinary incontinence. She has low back pain which is not more persistent or intense than prior. She feels anxious and depressed. A detailed review of systems today was otherwise stable  PAST MEDICAL HISTORY: Past Medical History  Diagnosis Date  . Endometriosis   . Hypertension   . Anemia   . Wears glasses   . Iron deficiency anemia, unspecified 02/25/2014  . BRCA1 positive     c.190T>G (p.Cys64Gly) @ [facility]   . History of blood transfusion     last 9'15. due to chemotherapy.  Marland Kitchen DVT (deep venous thrombosis) (HCC)     Right Subclavian and IJ.. Lovenox stopped 01-10-15 until after surgery planned 02-08-15.  . Fibroid tumor     02-02-15  remains with abdominal pain and some vaginal bleeding due to fibroids.  . Breast cancer ([place]) 02/01/14    ER-/PR-/Her2+, still receiving chemo 12/21/14, last Herceptin 02-02-15.Left breat cancer.  . Shortness of breath      when Hemoglobin low, now resolved after transfusions 9'15.  . Rash 02/22/2015  . PONV (postoperative nausea and vomiting)     needs scop patch    PAST SURGICAL HISTORY: Past  Surgical History  Procedure Laterality Date  . Cervical polypectomy  2010  . Cesarean section      one previous  . Tubal ligation    . Portacath placement Right 02/23/2014    Procedure: INSERTION PORT-A-CATH;  Surgeon: Joyice Faster. Cornett, MD;  Location: [place];  Service: General;  Laterality: Right;  . Port-a-cath removal N/A 05/21/2014    Procedure: REMOVAL PORT-A-CATH;  Surgeon: Zenovia Jarred, MD;  Location: [place];  Service: General;  Laterality: N/A;  . Tee without cardioversion N/A 05/26/2014    Procedure: TRANSESOPHAGEAL ECHOCARDIOGRAM (TEE);  Surgeon: Larey Dresser, MD;  Location[facility] ENDOSCOPY;  Service: Cardiovascular;  Laterality: N/A;  . Bilateral total mastectomy with axillary lymph node dissection Bilateral 07/28/2014    Procedure: BILATERAL TOTAL MASTECTOMY WITH LEFT AXILLARY SENTINEL LYMPH NODE BIOPSY;  Surgeon: Stark Klein, MD;  Location: [place];  Service: General;  Laterality: Bilateral;  . Breast reconstruction with placement of tissue expander and flex hd (acellular hydrated dermis) Bilateral 07/28/2014    Procedure: BILATERAL BREAST RECONSTRUCTION WITH PLACEMENT OF TISSUE EXPANDER AND FLEX HD (ACELLULAR HYDRATED DERMIS);  Surgeon: Theodoro Kos, DO;  Location: [place];  Service: Plastics;  Laterality: Bilateral;  . Removal of tissue expander and placement of implant Bilateral 10/14/2014    Procedure: REMOVAL OF BILATERAL BREAST  TISSUE EXPANDER  WITH PLASCEMENT OF BILATERAL  BREAST IMPLANTS;  Surgeon: Theodoro Kos, DO;  Location: [place];  Service: Plastics;  Laterality: Bilateral;  . Left heart catheterization with coronary angiogram N/A 09/13/2014    Procedure: LEFT HEART CATHETERIZATION WITH CORONARY ANGIOGRAM;  Surgeon: Jolaine Artist, MD;  Location: [facility] CATH LAB;  Service: Cardiovascular;  Laterality: N/A;  . Cardiac catheterization      10'15- Dr. Sung Amabile  . Robotic assisted total hysterectomy with bilateral salpingo oopherectomy  Bilateral 02/08/2015    Procedure: ROBOTIC ASSISTED TOTAL HYSTERECTOMY WITH BILATERAL SALPINGO OOPHORECTOMY; UTERUS WEIGHING GREATER THAN 250 GRAMS;  Surgeon: Everitt Amber, MD;  Location: [facility];  Service: Gynecology;  Laterality: Bilateral;  BRCA 1 GENE MUTATION  . Breast reconstruction Bilateral 03/31/2015    Procedure: BILATERAL BREAST RECONSTRUCTION REVISION WITH BILATERAL LIPOFILLING;  Surgeon: Theodoro Kos, DO;  Location: [place];  Service: Plastics;  Laterality: Bilateral;  . Liposuction with lipofilling Bilateral 03/31/2015    Procedure: LIPOSUCTION WITH LIPOFILLING;  Surgeon: Theodoro Kos, DO;  Location: [place];  Service: Plastics;  Laterality: Bilateral;    FAMILY HISTORY Family History  Problem Relation Age of Onset  . Breast cancer Mother 11    currently 54  . Diabetes Father   . Pancreatic cancer Father 17  . Breast cancer Paternal Aunt 74    currently 38; BRCA1 positive  . Stroke Maternal Grandfather   . Cancer Paternal Aunt     unk. primary; deceased 74s  . Breast cancer Cousin     daughter of unaffected paternal aunt; dx in her 22s  [name] father died from prostate cancer the age of 33. [name] mother was diagnosed with breast  cancer the age of 2. [name] father had 5 sisters, 3 of whom were diagnosed with breast cancer, 2 of them before the age of 44. The patient had one brother, no sisters. There is no history of ovarian cancer in the family.  GYNECOLOGIC HISTORY:  Menarche age 82, first live birth age 67, the patient is Tamara [name] P5. Her periods stopped at the time of chemotherapy. 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 Her (former) husband [name] works as an Animal nutritionist. Their divorce was finalized September 2016. [name] oldest child, a son, [name], is studying Therapist, occupational; the patient is a  60 year old daughter [name] is also in college. [name] younger children are 32, 90, and 44. The patient attends a [employer]   ADVANCED DIRECTIVES: Not in place; at the 12/15/2015 visit the patient was given the appropriate forms to complete and notarize at her discretion   HEALTH MAINTENANCE: Social History  Substance Use Topics  . Smoking status: Former Smoker -- 0.25 packs/day for 15 years    Quit date: 02/18/2009  . Smokeless tobacco: Former Systems developer  . Alcohol Use: No     Comment: occasional   Colonoscopy: Bone Density Scan:  Pap Smear:  Eye Exam:  Vitamin D Level:   Lipid Panel:    Allergies  Allergen Reactions  . Compazine [Prochlorperazine Edisylate] Other (See Comments)    Stuttering    Current Outpatient Prescriptions  Medication Sig Dispense Refill  . acetaminophen (TYLENOL) 325 MG tablet Take 2 tablets (650 mg total) by mouth every 6 (six) hours as needed for mild pain (or Temp > 100).    . Biotin 5000 MCG TABS Take 10,000 mcg by mouth daily.    . carvedilol (COREG) 12.5 MG tablet TAKE 1.5 TABLETS (18.75 MG TOTAL) BY MOUTH 2 (TWO) TIMES DAILY WITH A MEAL. 90 tablet 3  . diphenhydrAMINE (BENADRYL) 25 MG tablet Take 1 tablet (25 mg total) by mouth every 6 (six) hours. (Patient not taking: Reported on 11/11/2015) 20 tablet 0  . DULoxetine (CYMBALTA) 60 MG capsule Take 1 capsule (60 mg total) by mouth daily. 30 capsule 3  . ferrous sulfate 325 (65 FE) MG tablet Take 1 tablet (325 mg total) by mouth 3 (three) times daily with meals. 270 tablet 3  . hydrALAZINE (APRESOLINE) 50 MG tablet Take 1 tablet (50 mg total) by mouth every 8 (eight) hours. 90 tablet 3  . hydrochlorothiazide (MICROZIDE) 12.5 MG capsule Take 1 capsule (12.5 mg total) by mouth daily. (Patient taking differently: Take 12.5 mg by mouth daily as needed (high blood pressure). ) 90 capsule 3  . ibuprofen (ADVIL,MOTRIN) 800 MG tablet Take 800 mg by mouth every 8 (eight) hours as needed (pain).     Marland Kitchen lisinopril (PRINIVIL,ZESTRIL) 40 MG tablet Take 1 tablet (40 mg total) by mouth daily. 90 tablet 3  . LORazepam (ATIVAN) 0.5 MG tablet Take 0.5 mg by mouth every 8 (eight) hours as needed for anxiety or sleep.    Marland Kitchen ondansetron (ZOFRAN) 8 MG tablet Take 8 mg by mouth 2 (two) times daily as needed for nausea or vomiting.     Marland Kitchen oxyCODONE-acetaminophen (PERCOCET/ROXICET) 5-325 MG per tablet Take 1-2 tablets by mouth every 4 (four) hours as needed for severe pain. 20 tablet 0  . PRESCRIPTION MEDICATION Chemo at [facility]     No current facility-administered medications for this visit.  OBJECTIVE: Tamara [name] who appears well  Filed Vitals:   12/15/15 1551  BP: 137/87  Pulse: 69  Temp: 98.1 F (36.7 C)  Resp: 18     Body mass index is 34.86 kg/(m^2).      ECOG FS:0 - Asymptomatic  Sclerae unicteric, pupils round and equal Oropharynx clear and moist-- no thrush or other lesions No cervical or supraclavicular adenopathy Lungs no rales or rhonchi Heart regular rate and rhythm Abd soft, nontender, positive bowel sounds MSK no focal spinal tenderness, no leftupper extremity lymphedema; straight-leg raising only to about 30, eliciting cramps in both thighs Neuro: nonfocal, well oriented, appropriate affect Breasts:  status post bilateral mastectomies with reconstruction. No evidence of local recurrence. Both axillae are benign.  LAB RESULTS:  CMP     Component Value Date/Time   NA 143 12/01/2015 1009   NA 140 09/04/2015 1827   K 4.1 12/01/2015 1009   K 3.5 09/04/2015 1827   CL 111 09/04/2015 1827   CO2 26 12/01/2015 1009   CO2 26 09/04/2015 1827   GLUCOSE 95 12/01/2015 1009   GLUCOSE 97 09/04/2015 1827   BUN 20.9 12/01/2015 1009   BUN 18 09/04/2015 1827   CREATININE 1.0 12/01/2015 1009   CREATININE 0.92 09/04/2015 1827   CALCIUM 9.2 12/01/2015 1009   CALCIUM 9.0 09/04/2015 1827   PROT 6.9 12/01/2015 1009   PROT 7.2 03/03/2015 2308   ALBUMIN 3.7  12/01/2015 1009   ALBUMIN 4.0 03/03/2015 2308   AST 13 12/01/2015 1009   AST 18 03/03/2015 2308   ALT 12 12/01/2015 1009   ALT 16 03/03/2015 2308   ALKPHOS 94 12/01/2015 1009   ALKPHOS 92 03/03/2015 2308   BILITOT 0.34 12/01/2015 1009   BILITOT 0.3 03/03/2015 2308   GFRNONAA >60 09/04/2015 1827   GFRAA >60 09/04/2015 1827    I No results found for: SPEP  Lab Results  Component Value Date   WBC 8.8 12/15/2015   NEUTROABS 5.5 12/15/2015   HGB 12.3 12/15/2015   HCT 37.2 12/15/2015   MCV 87.3 12/15/2015   PLT 270 12/15/2015        Chemistry      Component Value Date/Time   NA 143 12/01/2015 1009   NA 140 09/04/2015 1827   K 4.1 12/01/2015 1009   K 3.5 09/04/2015 1827   CL 111 09/04/2015 1827   CO2 26 12/01/2015 1009   CO2 26 09/04/2015 1827   BUN 20.9 12/01/2015 1009   BUN 18 09/04/2015 1827   CREATININE 1.0 12/01/2015 1009   CREATININE 0.92 09/04/2015 1827      Component Value Date/Time   CALCIUM 9.2 12/01/2015 1009   CALCIUM 9.0 09/04/2015 1827   ALKPHOS 94 12/01/2015 1009   ALKPHOS 92 03/03/2015 2308   AST 13 12/01/2015 1009   AST 18 03/03/2015 2308   ALT 12 12/01/2015 1009   ALT 16 03/03/2015 2308   BILITOT 0.34 12/01/2015 1009   BILITOT 0.3 03/03/2015 2308       No results found for: LABCA2  No components found for: LABCA125  No results for input(s): INR in the last 168 hours.  Urinalysis    Component Value Date/Time   COLORURINE RED* 02/02/2015 1030   APPEARANCEUR CLOUDY* 02/02/2015 1030   LABSPEC 1.025 02/16/2015 1243   LABSPEC 1.024 02/02/2015 1030   PHURINE 6.0 02/16/2015 1243   PHURINE 6.0 02/02/2015 1030   GLUCOSEU Negative 02/16/2015 1243   GLUCOSEU NEGATIVE 02/02/2015 1030   HGBUR Negative  02/16/2015 1243   HGBUR LARGE* 02/02/2015 1030   BILIRUBINUR Negative 02/16/2015 [place] 02/02/2015 1030   KETONESUR 5 02/16/2015 1243   KETONESUR NEGATIVE 02/02/2015 1030   PROTEINUR < 30 02/16/2015 1243   PROTEINUR 30*  02/02/2015 1030   UROBILINOGEN 0.2 02/16/2015 1243   UROBILINOGEN 0.2 02/02/2015 1030   NITRITE Negative 02/16/2015 1243   NITRITE NEGATIVE 02/02/2015 1030   LEUKOCYTESUR Moderate 02/16/2015 1243   LEUKOCYTESUR SMALL* 02/02/2015 1030    STUDIES: No results found.   Assessment: 45 y.o. BRCA-1 positive Tamara [name]  1. S/p left breast upper outer quadrant biopsy of two separate breast masses and one axillary lymph node 01/29/2014 for a , clinical mT2 N1 stage IIB, invasive ductal carcinoma, grade 2-3,  estrogen and progesterone receptor negative, with an MIB-1 between 79-100%, and HER 2 amplified  2. completed 6 cycles of carboplatin, docetaxel, trastuzumab and pertuzumab 06/30/2014 with MRI 07/07/2014 showing a complete radiologic response  3. trastuzumab was to be continued to complete a year (through March 2016); however echocardiogram 07/05/2014 showed an ejection fraction of 45-50%-- trastuzumab was held after 06/30/2014 dose until EF recovery, resumed 11/09/2014  (a) cath report from 09/13/2014 shows normal coronaries and a normal left ventricular function with an ejection fraction of 55%.  (b) echo 03/01/2015 suggest continuing mild cardiomyopathy: Trastuzumab discontinued--final dose 02/22/2015  4. 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   5. MRSA port infection and septicemia mid-June 2015;  Port was removed, and  PICC line placed, completed antibiotic therapy with ANCEF; PICC subsequently pulled   6 genetic testing with the [facility] panel and was found to have a pathogenic mutation in the BRCA1 gene called c.190T>G (p.Cys64Gly  7. status post bilateral mastectomies and left axillary sentinel lymph node biopsy, withimmediate expander placement, on 07/28/14; the pathology ([id number]) showed a complete pathologic response in the left breast and the 3 sentinel lymph nodes sampled; the right breast was benign  (a) bilateral  breast reconstruction revision 03/31/2015  8. Bilateral salpingo-oophorectomy and hysterectomy 02/08/15 with benign pathology  PLAN:  Tamara [name] continues to recover from her chemotherapy, now with only very minimal residuals, including slight left arm discomfort without significant lymphedema and mild neuropathy involving chiefly the toes.  I think is wonderful that she has the job she now has and I'm sure she is doing a very good work with these disabled children. She would like a job more like 1 [employer] courts and she has an application here. In the meantime she has 3 children in college, and 2 at home.  Now that her divorce is finalized her husband is no longer her healthcare power of attorney and I gave her the appropriate documents for her to complete at her discretion. She intends to name her mother as healthcare power of attorney.  I'm not sure why she is having the numbness sensation in her left thigh area but she has very little flexibility at her hip joints and I gave her a copy of some barre exercises she can do at home.  Otherwise we will continue to see her on an every three-month basis for the remaining of this year after which we will broaden the follow-up interval. She knows to call for any problems that may develop before her next visit here. Tamara Cruel, MD  12/15/2015 4:14 PM

## 2016-01-05 ENCOUNTER — Encounter: Payer: Self-pay | Admitting: Internal Medicine

## 2016-01-05 ENCOUNTER — Ambulatory Visit: Payer: Medicaid Other | Admitting: Oncology

## 2016-01-05 ENCOUNTER — Other Ambulatory Visit: Payer: Medicaid Other

## 2016-01-05 ENCOUNTER — Other Ambulatory Visit: Payer: Self-pay | Admitting: *Deleted

## 2016-01-05 ENCOUNTER — Ambulatory Visit: Payer: Medicaid Other | Attending: Internal Medicine | Admitting: Internal Medicine

## 2016-01-05 VITALS — BP 136/84 | HR 67 | Temp 98.4°F | Resp 18 | Ht 66.0 in | Wt 220.6 lb

## 2016-01-05 DIAGNOSIS — Z87891 Personal history of nicotine dependence: Secondary | ICD-10-CM | POA: Diagnosis not present

## 2016-01-05 DIAGNOSIS — T451X5A Adverse effect of antineoplastic and immunosuppressive drugs, initial encounter: Secondary | ICD-10-CM

## 2016-01-05 DIAGNOSIS — Z79899 Other long term (current) drug therapy: Secondary | ICD-10-CM | POA: Insufficient documentation

## 2016-01-05 DIAGNOSIS — I427 Cardiomyopathy due to drug and external agent: Secondary | ICD-10-CM | POA: Diagnosis not present

## 2016-01-05 DIAGNOSIS — G620 Drug-induced polyneuropathy: Secondary | ICD-10-CM | POA: Diagnosis not present

## 2016-01-05 DIAGNOSIS — Z90710 Acquired absence of both cervix and uterus: Secondary | ICD-10-CM | POA: Insufficient documentation

## 2016-01-05 DIAGNOSIS — J9801 Acute bronchospasm: Secondary | ICD-10-CM

## 2016-01-05 DIAGNOSIS — Z888 Allergy status to other drugs, medicaments and biological substances status: Secondary | ICD-10-CM | POA: Diagnosis not present

## 2016-01-05 DIAGNOSIS — R05 Cough: Secondary | ICD-10-CM | POA: Insufficient documentation

## 2016-01-05 DIAGNOSIS — Z86718 Personal history of other venous thrombosis and embolism: Secondary | ICD-10-CM | POA: Insufficient documentation

## 2016-01-05 DIAGNOSIS — C50412 Malignant neoplasm of upper-outer quadrant of left female breast: Secondary | ICD-10-CM

## 2016-01-05 DIAGNOSIS — R200 Anesthesia of skin: Secondary | ICD-10-CM | POA: Diagnosis present

## 2016-01-05 DIAGNOSIS — Z9013 Acquired absence of bilateral breasts and nipples: Secondary | ICD-10-CM | POA: Insufficient documentation

## 2016-01-05 DIAGNOSIS — I10 Essential (primary) hypertension: Secondary | ICD-10-CM | POA: Diagnosis not present

## 2016-01-05 DIAGNOSIS — G629 Polyneuropathy, unspecified: Secondary | ICD-10-CM | POA: Insufficient documentation

## 2016-01-05 MED ORDER — CARVEDILOL 12.5 MG PO TABS: 18.7500 mg | ORAL_TABLET | Freq: Two times a day (BID) | ORAL | Status: DC

## 2016-01-05 MED ORDER — HYDROCHLOROTHIAZIDE 12.5 MG PO CAPS: 12.5000 mg | ORAL_CAPSULE | Freq: Every day | ORAL | Status: DC

## 2016-01-05 MED ORDER — GUAIFENESIN-DM 100-10 MG/5ML PO SYRP
5.0000 mL | ORAL_SOLUTION | ORAL | Status: DC | PRN
Start: 2016-01-05 — End: 2016-06-21

## 2016-01-05 MED ORDER — GABAPENTIN 100 MG PO CAPS: 100.0000 mg | ORAL_CAPSULE | Freq: Three times a day (TID) | ORAL | Status: DC

## 2016-01-05 MED ORDER — PANTOPRAZOLE SODIUM 40 MG PO TBEC
40.0000 mg | DELAYED_RELEASE_TABLET | Freq: Every day | ORAL | Status: DC
Start: 2016-01-05 — End: 2016-04-23

## 2016-01-05 MED ORDER — LISINOPRIL 40 MG PO TABS: 40.0000 mg | ORAL_TABLET | Freq: Every day | ORAL | Status: DC

## 2016-01-05 NOTE — Progress Notes (Signed)
Tamara [name], is a 45 y.o. female  JGO:[id number]  TDH:[id number]  DOB - 01-Mar-1971  CC:  Chief Complaint  Patient presents with  . Numbness    Left Leg     HPI: Tamara [name] is a 45 y.o. female here today to for numbness in left upper leg and cough. 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 Patient completed chemotherapy 12/30/2015. Patient is being followed by Dr Jana Hakim of [facility] for treatment and surveillance of Breast Cancer. Patient also had hysterectomy and bilateral salpingo-oophorectomy in April 2016. Patient is being followed by Dr. Rogelia Mire of [facility] for cardiomyopathy next appointment is 01/10/2016. Patient's blood pressure is well controlled on present medications and she states compliance with medication regime. Patient has complaint of numbness in her left hip radiating to her thigh, which began in early December, 2016 and is intermittent. Patient is not aware of any aggravating or precipitating factors, and states numbness is not accompanied by pain or mobility impairment. Patient was seen by Dr Jana Hakim and given exercises to improve ROM and numbness. Patient states no improvement with exercises. Additional complaint of cough worse at night for last several weeks. Patient states "mold found in her home, which has now been remediated". Cough is non-productive and is not accompanied by respiratory symptoms or fever.   Allergies  Allergen Reactions  . Compazine [Prochlorperazine Edisylate] Other (See Comments)    Stuttering   Past Medical History  Diagnosis Date  . Endometriosis   . Hypertension   . Anemia   . Wears glasses   . Iron deficiency anemia, unspecified 02/25/2014  . BRCA1 positive     c.190T>G (p.Cys64Gly) @ [facility]   . History of blood transfusion     last 9'15. due to chemotherapy.  Marland Kitchen DVT (deep venous thrombosis) (HCC)       Right Subclavian and IJ.. Lovenox stopped 01-10-15 until after surgery planned 02-08-15.  . Fibroid tumor     02-02-15 remains with abdominal pain and some vaginal bleeding due to fibroids.  . Breast cancer ([place]) 02/01/14    ER-/PR-/Her2+, still receiving chemo 12/21/14, last Herceptin 02-02-15.Left breat cancer.  . Shortness of breath      when Hemoglobin low, now resolved after transfusions 9'15.  . Rash 02/22/2015  . PONV (postoperative nausea and vomiting)     needs scop patch   Current Outpatient Prescriptions on File Prior to Visit  Medication Sig Dispense Refill  . acetaminophen (TYLENOL) 325 MG tablet Take 2 tablets (650 mg total) by mouth every 6 (six) hours as needed for mild pain (or Temp > 100).    . Biotin 5000 MCG TABS Take 10,000 mcg by mouth daily.    . DULoxetine (CYMBALTA) 60 MG capsule Take 1 capsule (60 mg total) by mouth daily. 30 capsule 3  . ferrous sulfate 325 (65 FE) MG tablet Take 1 tablet (325 mg total) by mouth 3 (three) times daily with meals. 270 tablet 3  . hydrALAZINE (APRESOLINE) 50 MG tablet Take 1 tablet (50 mg total) by mouth every 8 (eight) hours. 90 tablet 3  . ibuprofen (ADVIL,MOTRIN) 800 MG tablet Take 800 mg by mouth every 8 (eight) hours as needed (pain).    . LORazepam (ATIVAN) 0.5 MG tablet Take 0.5 mg by mouth every 8 (eight) hours as needed for anxiety or sleep.    Marland Kitchen ondansetron (ZOFRAN) 8 MG tablet Take 8 mg by  mouth 2 (two) times daily as needed for nausea or vomiting.     . diphenhydrAMINE (BENADRYL) 25 MG tablet Take 1 tablet (25 mg total) by mouth every 6 (six) hours. (Patient not taking: Reported on 01/05/2016) 20 tablet 0   No current facility-administered medications on file prior to visit.   Family History  Problem Relation Age of Onset  . Breast cancer Mother 44    currently 73  . Diabetes Father   . Pancreatic cancer Father 46  . Breast cancer Paternal Aunt 67    currently 83; BRCA1 positive  . Stroke Maternal Grandfather   . Cancer  Paternal Aunt     unk. primary; deceased 85s  . Breast cancer Cousin     daughter of unaffected paternal aunt; dx in her 13s   Social History   Social History  . Marital Status: Legally Separated    Spouse Name: N/A  . Number of Children: 5  . Years of Education: N/A   Occupational History  . Not on file.   Social History Main Topics  . Smoking status: Former Smoker -- 0.25 packs/day for 15 years    Quit date: 02/18/2009  . Smokeless tobacco: Former Systems developer  . Alcohol Use: No     Comment: occasional  . Drug Use: No  . Sexual Activity: Not Currently    Birth Control/ Protection: Surgical   Other Topics Concern  . Not on file   Social History Narrative    Review of Systems: Constitutional: Negative for fever, chills, diaphoresis, activity change, appetite change and fatigue. HENT: Negative for ear pain, nosebleeds, congestion, facial swelling, rhinorrhea, neck pain, neck stiffness and ear discharge.  Eyes: Negative for pain, discharge, redness, itching and visual disturbance. Respiratory: Negative for cough, choking, chest tightness, shortness of breath, wheezing and stridor.  Cardiovascular: Negative for chest pain, palpitations and leg swelling. Gastrointestinal: Negative for abdominal distention. Genitourinary: Negative for dysuria, urgency, frequency, hematuria, flank pain, decreased urine volume, difficulty urinating and dyspareunia.  Musculoskeletal: Negative for back pain, joint swelling, arthralgia and gait problem. Neurological: Negative for dizziness, tremors, seizures, syncope, facial asymmetry, speech difficulty, weakness, light-headedness, numbness and headaches.  Hematological: Negative for adenopathy. Does not bruise/bleed easily. Psychiatric/Behavioral: Negative for hallucinations, behavioral problems, confusion, dysphoric mood, decreased concentration and agitation.    Objective:   Filed Vitals:   01/05/16 1147  BP: 136/84  Pulse: 67  Temp: 98.4 F  (36.9 C)  Resp: 18    Physical Exam: Constitutional: Patient appears well-developed and well-nourished. No distress. HENT: Normocephalic, atraumatic, External right and left ear normal.  Eyes: Conjunctivae and EOM are normal.  Neck: Normal ROM. Neck supple. No JVD. No tracheal deviation. No thyromegaly. CVS: RRR, S1/S2 +, no murmurs, no gallops, no carotid bruit.  Pulmonary: Effort and breath sounds normal, no stridor, rhonchi, wheezes, rales.  Abdominal: Soft. BS +, no distension, tenderness, rebound or guarding.  Musculoskeletal: Normal range of motion. No edema and no tenderness.  Lymphadenopathy: No lymphadenopathy noted Neuro: Alert. Normal reflexes, muscle tone coordination. No cranial nerve deficit. Skin: Skin is warm and dry. No rash noted. Not diaphoretic. No erythema. No pallor. Psychiatric: Normal mood and affect. Behavior, judgment, thought content normal.  Lab Results  Component Value Date   WBC 8.8 12/15/2015   HGB 12.3 12/15/2015   HCT 37.2 12/15/2015   MCV 87.3 12/15/2015   PLT 270 12/15/2015   Lab Results  Component Value Date   CREATININE 0.9 12/15/2015   BUN 17.4 12/15/2015  NA 142 12/15/2015   K 3.4* 12/15/2015   CL 111 09/04/2015   CO2 23 12/15/2015    Lab Results  Component Value Date   HGBA1C 5.0 01/29/2014   Lipid Panel     Component Value Date/Time   CHOL 165 01/29/2014 0938   TRIG 65 01/29/2014 0938   HDL 65 01/29/2014 0938   CHOLHDL 2.5 01/29/2014 0938   VLDL 13 01/29/2014 0938   LDLCALC 87 01/29/2014 0938       Assessment and plan:   Tamara [name] was seen today for numbness.  Diagnoses and all orders for this visit:  Breast cancer of upper-outer quadrant of left female breast [facility])  Status post bilateral mastectomy, bilateral salpingo-oophorectomy and chemotherapy Patient is clinically stable Continue follow-up with oncology  Essential hypertension, benign  -     lisinopril (PRINIVIL,ZESTRIL) 40 MG tablet; Take 1 tablet (40 mg  total) by mouth daily. -     hydrochlorothiazide (MICROZIDE) 12.5 MG capsule; Take 1 capsule (12.5 mg total) by mouth daily. -     carvedilol (COREG) 12.5 MG tablet; Take 1.5 tablets (18.75 mg total) by mouth 2 (two) times daily with a meal.  We have discussed target BP range and blood pressure goal. I have advised patient to check BP regularly and to call us back or report to clinic if the numbers are consistently higher than 140/90. We discussed the importance of compliance with medical therapy and DASH diet recommended, consequences of uncontrolled hypertension discussed.   Cough due to bronchospasm: May be due to reflux or allergies  -     pantoprazole (PROTONIX) 40 MG tablet; Take 1 tablet (40 mg total) by mouth daily. -     guaiFENesin-dextromethorphan (ROBITUSSIN DM) 100-10 MG/5ML syrup; Take 5 mLs by mouth every 4 (four) hours as needed for cough.  Neuropathy due to chemotherapeutic drug (HCC)  -     gabapentin (NEURONTIN) 100 MG capsule; Take 1 capsule (100 mg total) by mouth 3 (three) times daily.  Return in about 3 months (around 04/03/2016) for Follow up HTN, Follow up Pain and comorbidities, Pap Smear.  The patient was given clear instructions to go to ER or return to medical center if symptoms don't improve, worsen or new problems develop. The patient verbalized understanding.   Tamara [name] and Wellness [phone] 01/05/2016, 2:33 PM  Evaluation and management procedures were performed by the Advanced Practitioner under my supervision and collaboration. I have reviewed the Advanced Practitioner's note and chart, and I agree with the management and plan.   Angelica Chessman, MD, [place], [place], [place], [place] and [place], [place]   01/05/2016, 6:19 PM

## 2016-01-05 NOTE — Progress Notes (Signed)
Patient here for Left Leg Numbness and Finger Cut on right index finger.  Patient complains of left leg numbness being present. Symptom is intermittent. Patient also complains of dry cough at night which is described as burning in her throat and chest.  Patient is considering the flu shot.

## 2016-01-10 ENCOUNTER — Ambulatory Visit (HOSPITAL_BASED_OUTPATIENT_CLINIC_OR_DEPARTMENT_OTHER)
Admission: RE | Admit: 2016-01-10 | Discharge: 2016-01-10 | Disposition: A | Discharge status: Home / Self Care | Payer: Medicaid Other | Source: Ambulatory Visit | Attending: Internal Medicine | Admitting: Internal Medicine

## 2016-01-10 ENCOUNTER — Encounter (HOSPITAL_COMMUNITY): Payer: Self-pay | Admitting: Internal Medicine

## 2016-01-10 ENCOUNTER — Ambulatory Visit (HOSPITAL_COMMUNITY)
Admission: RE | Admit: 2016-01-10 | Discharge: 2016-01-10 | Disposition: A | Discharge status: Home / Self Care | Payer: Medicaid Other | Source: Ambulatory Visit | Attending: Internal Medicine | Admitting: Internal Medicine

## 2016-01-10 VITALS — BP 124/76 | HR 62 | Wt 220.2 lb

## 2016-01-10 DIAGNOSIS — Z803 Family history of malignant neoplasm of breast: Secondary | ICD-10-CM | POA: Diagnosis not present

## 2016-01-10 DIAGNOSIS — Z87891 Personal history of nicotine dependence: Secondary | ICD-10-CM | POA: Insufficient documentation

## 2016-01-10 DIAGNOSIS — Z888 Allergy status to other drugs, medicaments and biological substances status: Secondary | ICD-10-CM | POA: Diagnosis not present

## 2016-01-10 DIAGNOSIS — Z86718 Personal history of other venous thrombosis and embolism: Secondary | ICD-10-CM | POA: Insufficient documentation

## 2016-01-10 DIAGNOSIS — C50412 Malignant neoplasm of upper-outer quadrant of left female breast: Secondary | ICD-10-CM | POA: Diagnosis not present

## 2016-01-10 DIAGNOSIS — Z8742 Personal history of other diseases of the female genital tract: Secondary | ICD-10-CM | POA: Insufficient documentation

## 2016-01-10 DIAGNOSIS — Z9013 Acquired absence of bilateral breasts and nipples: Secondary | ICD-10-CM | POA: Diagnosis not present

## 2016-01-10 DIAGNOSIS — I10 Essential (primary) hypertension: Secondary | ICD-10-CM | POA: Insufficient documentation

## 2016-01-10 DIAGNOSIS — I429 Cardiomyopathy, unspecified: Secondary | ICD-10-CM

## 2016-01-10 DIAGNOSIS — Z79899 Other long term (current) drug therapy: Secondary | ICD-10-CM | POA: Insufficient documentation

## 2016-01-10 DIAGNOSIS — Z171 Estrogen receptor negative status [ER-]: Secondary | ICD-10-CM | POA: Diagnosis not present

## 2016-01-10 NOTE — Progress Notes (Signed)
*  PRELIMINARY RESULTS* Echocardiogram 2D Echocardiogram limited has been performed.  Tamara [name] 01/10/2016, 10:43 AM

## 2016-01-10 NOTE — Addendum Note (Signed)
Encounter addended by: Effie Berkshire, RN on: 01/10/2016 11:28 AM<BR>     Documentation filed: Patient Instructions Section

## 2016-01-10 NOTE — Patient Instructions (Signed)
Follow up 12 months with echocardiogram.  Do the following things EVERYDAY: 1) Weigh yourself in the morning before breakfast. Write it down and keep it in a log. 2) Take your medicines as prescribed 3) Eat low salt foods-Limit salt (sodium) to 2000 mg per day.  4) Stay as active as you can everyday 5) Limit all fluids for the day to less than 2 liters

## 2016-01-10 NOTE — Progress Notes (Signed)
Patient ID: Tamara [name], female   DOB: 1971/07/04, 46 y.o.   MRN: [id number]  [place] NOTE  Patient ID: Tamara [name], female   DOB: 1971/01/16, 45 y.o.   MRN: [id number] Referring Physician: Dr. Jana Hakim Primary Care: [place] Primary Cardiologist: [place] Surgeon: Dr Migdalia Dk   HPI: [name] is a 45  yo with a history of HTN and chronic anemia. She was diagnosed with L breast cancer in 3/15. 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 Lymph node was positive for metastatic disease.She is S/P Bilateral Mastectomy with reconstruction 09/02/2014.   Was treated with Taxotere and carboplatinum + Herceptin/perjeta x 6 cycles up front.  Admitted with MSSA bacteremia due to port infection. Completed IV abx. TEE in 6/15 with EF 50-55%. Found to have IJ/subclavian DVT. Treated with lovenox for 6 months. Stopped in June 2016.   Herceptin started 3/15. However echocardiogram 07/05/2014 showed an ejection fraction of 45-50%-- trastuzumab was held after 06/30/2014 dose until EF recovery, resumed 11/09/2014 (a) cath report from 09/13/2014 shows normal coronaries and a normal left ventricular function with an ejection fraction of 55%.             (b)  [facility] 12/2014: EF 56% No wall abnormality noted. RV ok. No infiltrative disease noted.  (c) echo 03/01/2015 suggested recurrent mild cardiomyopathy: Trastuzumab discontinued 02/22/2015             (d) was seen in 8/16. I read ECHO as low normal at 50% (formal report reads 40-45%) and Herceptin restarted. Had one dose on 08/04/2015 ( I reviewed echo with Dr. Aundra Dubin who agreed EF 50-55%)  Follow-up: Here for routine f/u. Feels fine. Herceptin finished 1/17. No orthopnea, PND or edema. I reviewed echo personally with her. Struggling with neuropathy. 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 + inferior lateral wall aneurysm. Small pericardial effusion  ECHO 11/15: EF 55%, basal anterolateral hypokinesis (basal inferolateral wall not well-visualized), lateral S' 13.6, images not adequate to assess strain.  ECHO 03/01/2015: EF 45% lateral s' -9.1 GLS -19.6 ECHO 07/20/15: EF 50% lateral s' 10.5 GLS -14.5 (poor tracking)  ECHO 01/10/16: EF 45-50% lateral s' 9.1 GLS -18 mid to basilar inferolateral wall HK  SH: Does not smoke or drink  FH: Mother diagnosed breast CA at age 44, living        2 paternal aunts - breast cancer (1 deceased and 1 living)        Father- Deceased at age 65 of cancer not sure what kind        - Has 43 yo, 43 yo, 43 yo, 43 yo and 43 yo (3 boys and 2 girls)  ROS: All systems reviewed and negative except as per HPI.    Past Medical History  Diagnosis Date  . Endometriosis   . Hypertension   . Anemia   . Wears glasses   .  Iron deficiency anemia, unspecified 02/25/2014  . BRCA1 positive     c.190T>G (p.Cys64Gly) @ [facility]   . History of blood transfusion     last 9'15. due to chemotherapy.  Marland Kitchen DVT (deep venous thrombosis) (HCC)     Right Subclavian and IJ.. Lovenox stopped 01-10-15 until after surgery planned 02-08-15.  . Fibroid tumor     02-02-15 remains with abdominal pain and some vaginal bleeding due to fibroids.  . Breast cancer ([place]) 02/01/14    ER-/PR-/Her2+, still receiving chemo 12/21/14, last Herceptin 02-02-15.Left breat cancer.  . Shortness of breath      when Hemoglobin low, now resolved after transfusions 9'15.  .  Rash 02/22/2015  . PONV (postoperative nausea and vomiting)     needs scop patch    Current Outpatient Prescriptions  Medication Sig Dispense Refill  . acetaminophen (TYLENOL) 325 MG tablet Take 2 tablets (650 mg total) by mouth every 6 (six) hours as needed for mild pain (or Temp > 100).    . Biotin 5000 MCG TABS Take 10,000 mcg by mouth daily.    . carvedilol (COREG) 12.5 MG tablet Take 1.5 tablets (18.75 mg total) by mouth 2 (two) times daily with a meal. 90 tablet 3  . ferrous sulfate 325 (65 FE) MG tablet Take 1 tablet (325 mg total) by mouth 3 (three) times daily with meals. 270 tablet 3  . gabapentin (NEURONTIN) 100 MG capsule Take 1 capsule (100 mg total) by mouth 3 (three) times daily. 90 capsule 3  . guaiFENesin-dextromethorphan (ROBITUSSIN DM) 100-10 MG/5ML syrup Take 5 mLs by mouth every 4 (four) hours as needed for cough. 480 mL 0  . hydrALAZINE (APRESOLINE) 50 MG tablet Take 1 tablet (50 mg total) by mouth every 8 (eight) hours. 90 tablet 3  . hydrochlorothiazide (MICROZIDE) 12.5 MG capsule Take 1 capsule (12.5 mg total) by mouth daily. 90 capsule 3  . ibuprofen (ADVIL,MOTRIN) 800 MG tablet Take 800 mg by mouth every 8 (eight) hours as needed (pain).    Marland Kitchen lisinopril (PRINIVIL,ZESTRIL) 40 MG tablet Take 1 tablet (40 mg total) by mouth daily. 90 tablet 3  . LORazepam (ATIVAN) 0.5 MG tablet Take 0.5 mg by mouth every 8 (eight) hours as needed for anxiety or sleep.    . pantoprazole (PROTONIX) 40 MG tablet Take 1 tablet (40 mg total) by mouth daily. 30 tablet 3  . diphenhydrAMINE (BENADRYL) 25 MG tablet Take 1 tablet (25 mg total) by mouth every 6 (six) hours. (Patient not taking: Reported on 01/05/2016) 20 tablet 0  . ondansetron (ZOFRAN) 8 MG tablet Take 8 mg by mouth 2 (two) times daily as needed for nausea or vomiting. Reported on 01/10/2016     No current facility-administered medications for this encounter.    Allergies  Allergen Reactions  . Compazine [Prochlorperazine  Edisylate] Other (See Comments)    Stuttering     Filed Vitals:   01/10/16 1052  BP: 124/76  Pulse: 62  Weight: 220 lb 4 oz (99.905 kg)  SpO2: 98%   PHYSICAL EXAM: General:  Well appearing. No respiratory difficulty HEENT: normal Neck: supple. no JVD. Carotids 2+ bilat; no bruits. No lymphadenopathy or thryomegaly appreciated. Cor: PMI nondisplaced. Regular rate & rhythm. No rubs, gallops 2/6 TR Lungs: clear Abdomen: soft, nontender, nondistended. No hepatosplenomegaly. No bruits or masses. Good bowel sounds. Extremities: no cyanosis, clubbing, rash, edema  Neuro: alert & oriented x 3, cranial nerves grossly intact. moves all 4 extremities w/o difficulty. Affect pleasant.  ASSESSMENT &  PLAN:  1) Cardiomyopathy:  This seems to have pre-dated Herceptin use. Echo from 8/16 with EF 50-55%. I reviewed echo today EF 45-50% with inferolateral HK (inferolateral wall normal by cMRI)  -  Finished herceptin 1/17. Echo reviewed personally today and parameters overall stable. Will f/u in 12 months with echo - Continue Coreg to 18.75  mg bid. - Continue lisinopril at 40 mg daily  2) Breast Cancer L, stage IV: HER-2/neu positive with a proliferation marker Ki-67 79%. Herceptin completed 3) HTN - PCP recently add hydralazine - Blood pressure well controlled. Continue current regimen. 4) DVT -Finished treatment with lovenox.  Eugene Isadore,MD 11:07 AM

## 2016-01-19 ENCOUNTER — Other Ambulatory Visit: Payer: Medicaid Other | Admitting: Internal Medicine

## 2016-01-27 ENCOUNTER — Encounter: Payer: Self-pay | Admitting: Clinical

## 2016-01-27 NOTE — Progress Notes (Signed)
Depression screen [facility] 2/9 01/05/2016 01/05/2016 06/11/2014  Decreased Interest 0 0 0  Down, Depressed, Hopeless 2 0 0  PHQ - 2 Score 2 0 0  Altered sleeping 2 - -  Tired, decreased energy 2 - -  Change in appetite 2 - -  Feeling bad or failure about yourself  3 - -  Trouble concentrating 2 - -  Moving slowly or fidgety/restless 0 - -  Suicidal thoughts 0 - -  PHQ-9 Score 13 - -    01-05-16: GAD7   12 (2,1,1,2,1,3,2)

## 2016-02-06 ENCOUNTER — Encounter: Payer: Self-pay | Admitting: Internal Medicine

## 2016-02-06 ENCOUNTER — Ambulatory Visit: Payer: Medicaid Other | Attending: Internal Medicine | Admitting: Internal Medicine

## 2016-02-06 VITALS — BP 139/82 | HR 63 | Temp 98.1°F | Resp 18 | Ht 66.0 in | Wt 217.8 lb

## 2016-02-06 DIAGNOSIS — Z888 Allergy status to other drugs, medicaments and biological substances status: Secondary | ICD-10-CM | POA: Diagnosis not present

## 2016-02-06 DIAGNOSIS — I10 Essential (primary) hypertension: Secondary | ICD-10-CM | POA: Insufficient documentation

## 2016-02-06 DIAGNOSIS — Z9013 Acquired absence of bilateral breasts and nipples: Secondary | ICD-10-CM | POA: Insufficient documentation

## 2016-02-06 DIAGNOSIS — S39012A Strain of muscle, fascia and tendon of lower back, initial encounter: Secondary | ICD-10-CM | POA: Insufficient documentation

## 2016-02-06 DIAGNOSIS — Z79899 Other long term (current) drug therapy: Secondary | ICD-10-CM | POA: Diagnosis not present

## 2016-02-06 DIAGNOSIS — C50911 Malignant neoplasm of unspecified site of right female breast: Secondary | ICD-10-CM | POA: Diagnosis not present

## 2016-02-06 DIAGNOSIS — I427 Cardiomyopathy due to drug and external agent: Secondary | ICD-10-CM | POA: Diagnosis not present

## 2016-02-06 DIAGNOSIS — I429 Cardiomyopathy, unspecified: Secondary | ICD-10-CM | POA: Insufficient documentation

## 2016-02-06 DIAGNOSIS — S46812A Strain of other muscles, fascia and tendons at shoulder and upper arm level, left arm, initial encounter: Secondary | ICD-10-CM

## 2016-02-06 DIAGNOSIS — M549 Dorsalgia, unspecified: Secondary | ICD-10-CM | POA: Diagnosis not present

## 2016-02-06 DIAGNOSIS — Z86718 Personal history of other venous thrombosis and embolism: Secondary | ICD-10-CM | POA: Insufficient documentation

## 2016-02-06 MED ORDER — ACETAMINOPHEN-CODEINE #3 300-30 MG PO TABS: 1.0000 | ORAL_TABLET | ORAL | Status: DC | PRN

## 2016-02-06 MED ORDER — CYCLOBENZAPRINE HCL 10 MG PO TABS
10.0000 mg | ORAL_TABLET | Freq: Three times a day (TID) | ORAL | Status: DC | PRN
Start: 2016-02-06 — End: 2016-11-06

## 2016-02-06 NOTE — Patient Instructions (Signed)
Subscapularis Disruption With Rehab A subscapularis disruption is a partial or complete tear of the subscapularis muscle or tendon. The subscapularis muscle is one of the rotator cuff muscles and is responsible for stabilizing the shoulder joint (glenohumeral) and inwardly (medially) rotating the upper arm. Subscapularis disruptions are uncommon, because the subscapularis is the strongest of the rotator cuff muscles. Collectively, the rotator cuff muscles are responsible for stabilizing the shoulder joint as well as providing the force for much of the shoulder movements. The shoulder joint allows more movement that any other joint, and for this reason it is susceptible to injury. SYMPTOMS   Shoulder pain, especially in the front (anterior) part of the shoulder and upper arm.  Pain that worsens with function of the subscapularis.  Tenderness, inflammation, or redness over the injury.  Decreased shoulder function, especially medial rotation.  Crackling sound (crepitation) when the shoulder is moved.  Recurrent shoulder dislocation, if disruption is due to or associated with shoulder dislocation. CAUSES  Subscapularis disruptions occur when a force is placed on the subscapularis muscle or tendon that is greater than it can withstand. Common mechanisms of injury include:  Forceful outward (external) rotation of the shoulder.  Hyperextension of the shoulder.  Direct trauma to the shoulder.  Shoulder dislocation. RISK INCREASES WITH:  Contact sports (football, wrestling, or rugby).  Previous shoulder injury.  Poor strength and flexibility.  Failure to warm-up properly before activity.  Poorly fitted or padded protective equipment.  Shoulder dislocation or subluxation.  Shoulder surgery that required moving the subscapularis muscle or tendon. PREVENTION  Warm up and stretch properly before activity.  Maintain physical fitness:  Strength, flexibility, and  endurance.  Cardiovascular fitness.  Learn and use proper technique. When possible, have a coach correct improper technique. PROGNOSIS  Subscapularis disruptions are unable to heal without surgical intervention. RELATED COMPLICATIONS   Recurrent symptoms that result in a chronic problem.  Other shoulder conditions (frozen shoulder).  Inability to compete in athletics at the same level of play.  Recurrent shoulder dislocation.  Risks of surgery: infection, bleeding, nerve damage, or damage to surrounding tissues. TREATMENT  Treatment initially involves the use of ice and medication to help reduce pain and inflammation. The use of strengthening and stretching exercises may help reduce pain with activity. These exercises may be performed at home or with referral to a therapist. Surgery is the definitive treatment for this injury. Surgery involves reattaching the torn tendon. In order to have the best outcome, then surgery should be performed within 3 months of injury. After surgery a post-operative rehabilitation program is necessary to regain strength and a full range of motion. Return to sports typically takes 6 to 12 months. MEDICATION   If pain medication is necessary, then nonsteroidal anti-inflammatory medications, such as aspirin and ibuprofen, or other minor pain relievers, such as acetaminophen, are often recommended.  Do not take pain medication for 7 days before surgery.  Prescription pain relievers may be given if deemed necessary by your caregiver. Use only as directed and only as much as you need. HEAT AND COLD  Cold treatment (icing) relieves pain and reduces inflammation. Cold treatment should be applied for 10 to 15 minutes every 2 to 3 hours for inflammation and pain and immediately after any activity that aggravates your symptoms. Use ice packs or massage the area with a piece of ice (ice massage).  Heat treatment may be used prior to performing the stretching and  strengthening activities prescribed by your caregiver, physical therapist, or athletic  trainer. Use a heat pack or soak your injury in warm water. SEEK MEDICAL CARE IF:  Treatment seems to offer no benefit, or the condition worsens.  Any medications produce adverse side effects.  Any complications from surgery occur:  Pain, numbness, or coldness in the extremity operated upon.  Discoloration of the nail beds (they become blue or gray) of the extremity operated upon.  Signs of infections (fever, pain, inflammation, redness, or persistent bleeding). EXERCISES RANGE OF MOTION (ROM) AND STRETCHING EXERCISES ROM - Subscapularis Disruption These exercises may help you restore your elbow mobility once your physician has discontinued your immobilization period. Beginning these before your provider's approval may result in delayed healing. Your symptoms may resolve with or without further involvement from your physician, physical therapist or athletic trainer. While completing these exercises, remember:   Restoring tissue flexibility helps normal motion to return to the joints. This allows healthier, less painful movement and activity.  An effective stretch should be held for at least 30 seconds. A stretch should never be painful. You should only feel a gentle lengthening or release in the stretch. ROM - Pendulum   Bend at the waist so that your right / left arm falls away from your body. Support yourself with your opposite hand on a solid surface, such as a table or a countertop.  Your right / left arm should be perpendicular to the ground. If it is not perpendicular, you need to lean over farther. Relax the muscles in your right / left arm and shoulder as much as possible.  Gently sway your hips and trunk so they move your right / left arm without any use of your right / left shoulder muscles.  Progress your movements so that your right / left arm moves side to side, then forward and backward,  and finally, both clockwise and counterclockwise.  Complete __________ repetitions in each direction. Many people use this exercise to relieve discomfort in their shoulder as well as to gain range of motion. Repeat __________ times. Complete this exercise __________ times per day. STRETCH - Flexion, Seated   Sit in a firm chair so that your right / left forearm can rest on a table or on a table or countertop. Your right / left elbow should rest below the height of your shoulder so that your shoulder feels supported and not tense or uncomfortable.  Keeping your right / left shoulder relaxed, lean forward at your waist, allowing your right / left hand to slide forward. Bend forward until you feel a moderate stretch in your shoulder, but before you feel an increase in your pain.  Hold __________ seconds. Slowly return to your starting position. Repeat __________ times. Complete this exercise __________ times per day.  STRETCH - Flexion, Standing  Stand with good posture. With an underhand grip on your right / left and an overhand grip on the opposite hand, grasp a broomstick or cane so that your hands are a little more than shoulder-width apart.  Keeping your right / leftelbow straight and shoulder muscles relaxed, push the stick with your opposite hand to raise your right / left arm in front of your body and then overhead. Raise your arm until you feel a stretch in your right / left shoulder, but before you have increased shoulder pain.  Try to avoid shrugging your right / left shoulder as your arm rises by keeping your shoulder blade tucked down and toward your mid-back spine. Hold __________ seconds.  Slowly return to the starting  position. Repeat __________ times. Complete this exercise __________ times per day. STRETCH - Abduction, Supine  Stand with good posture. With an underhand grip on your right / left and an overhand grip on the opposite hand, grasp a broomstick or cane so that your  hands are a little more than shoulder-width apart.  Keeping your right / leftelbow straight and shoulder muscles relaxed, push the stick with your opposite hand to raise your right / left arm out to the side of your body and then overhead. Raise your arm until you feel a stretch in your right / left shoulder, but before you have increased shoulder pain.  Try to avoid shrugging your right / left shoulder as your arm rises by keeping your shoulder blade tucked down and toward your mid-back spine. Hold __________ seconds.  Slowly return to the starting position. Repeat __________ times. Complete this exercise __________ times per day. ROM - Flexion, Active-Assisted  Lie on your back. You may bend your knees for comfort.  Grasp a broomstick or cane so your hands are about shoulder-width apart. Your right / left hand should grip the end of the stick/cane so that your hand is positioned "thumbs-up," as if you were about to shake hands.  Using your healthy arm to lead, raise your right / left arm overhead until you feel a gentle stretch in your shoulder. Hold __________ seconds.  Use the stick/cane to assist in returning your right / left arm to its starting position. Repeat __________ times. Complete this exercise __________ times per day.  STRETCH - External Rotation and Abduction  Stagger your stance through a doorframe. It does not matter which foot is forward.  Choose one of the following positions as instructed by your physician, physical therapist or athletic trainer. Place your hands:  and forearms above your head and on the door frame.  and forearms at head-height and on the door frame.  at elbow-height and on the door frame.  Keeping your head and chest upright and your stomach muscles tight to prevent over-extending your low-back, slowly shift your weight onto your front foot until you feel a stretch across your chest and/or in the front of your shoulders.  Hold __________  seconds. Shift your weight to your back foot to release the stretch. Repeat __________ times. Complete this stretch __________ times per day.  STRENGTHENING EXERCISES - Subscapularis Disruption These exercises may help you when beginning to rehabilitate your injury. They may resolve your symptoms with or without further involvement from your physician, physical therapist or athletic trainer. While completing these exercises, remember:   Muscles can gain both the endurance and the strength needed for everyday activities through controlled exercises.  Complete these exercises as instructed by your physician, physical therapist or athletic trainer. Progress with the resistance and repetition exercises only as your caregiver advises.  You may experience muscle soreness or fatigue, but the pain or discomfort you are trying to eliminate should never worsen during these exercises. If this pain does worsen, stop and make certain you are following the directions exactly. If the pain is still present after adjustments, discontinue the exercise until you can discuss the trouble with your clinician. STRENGTH - Shoulder Extensors, Prone  Lie on your stomach on a firm surface so that your right / left arm overhangs the edge. Rest your forehead on your opposite forearm. With your thumb facing away from your body and your elbow straight, hold a __________ weight in your hand.  Squeeze your right /  left shoulder blade to your mid-back spine and then slowly raise your arm behind you to the height of the bed.  Hold for __________ seconds. Slowly reverse the directions and return to the starting position, controlling the weight as you lower your arm. Repeat __________ times. Complete this exercise __________ times per day.  STRENGTH - Internal Rotators, Isometric  Keep your right / left elbow at your side and bend it 90 degrees.  Step into a door frame so that the inside of your right / left wrist can press  against the door frame without your upper arm leaving your side.  Gently press your right / left wrist into the door frame as if you were trying to draw the palm of your hand to your abdomen. Gradually increase the tension until you are pressing as hard as you can without shrugging your shoulder or increasing any shoulder discomfort.  Hold __________ seconds.  Release the tension slowly. Relax your shoulder muscles completely before you do the next repetition. Repeat __________ times. Complete this exercise __________ times per day.  STRENGTH - Internal Rotators  Secure a rubber exercise band/tubing to a fixed object so that it is at the same height as your right / left elbow when you are standing or sitting on a firm surface.  Stand or sit so that the secured exercise band/tubing is at your right / left side.  Bend your elbow 90 degrees. Place a folded towel or small pillow under your right / left arm so that your elbow is a few inches away from your side.  Keeping the tension on the exercise band/tubing, pull it across your body toward your abdomen. Be sure to keep your body steady so that the movement is only coming from your shoulder rotating.  Hold __________ seconds. Release the tension in a controlled manner as you return to the starting position. Repeat __________ times. Complete this exercise __________ times per day.  STRENGTH - External Rotators   Secure a rubber exercise band/tubing to a fixed object so that it is at the same height as your right / left elbow when you are standing or sitting on a firm surface.  Stand or sit so that the secured exercise band/tubing is at your side that is not injured.  Bend your elbow 90 degrees. Place a folded towel or small pillow under your right / left arm so that your elbow is a few inches away from your side.  Keeping the tension on the exercise band/tubing, pull it away from your body, as if pivoting on your elbow. Be sure to keep your  body steady so that the movement is only coming from your shoulder rotating.  Hold __________ seconds. Release the tension in a controlled manner as you return to the starting position. Repeat __________ times. Complete this exercise __________ times per day.  STRENGTH - Shoulder Extensors   Secure a rubber exercise band/tubing so that it is at the height of your shoulders when you are either standing or sitting on a firm arm-less chair.  With a thumbs-up grip, grasp an end of the band/tubing in each hand. Straighten your elbows and lift your hands straight in front of you at shoulder height. Step back away from the secured end of band/tubing until it becomes tense.  Squeezing your shoulder blades together, pull your hands down to the sides of your thighs. Do not allow your hands to go behind you.  Hold for __________ seconds. Slowly ease the tension on  the band/tubing as you reverse the directions and return to the starting position. Repeat __________ times. Complete this exercise __________ times per day.  STRENGTH - Scapular Protractors, Quadruped  Get onto your hands and knees with your shoulders directly over your hands (or as close as you comfortably can be).  Keeping your elbows locked, lift the back of your rib cage up into your shoulder blades so your mid-back rounds-out. Keep your neck muscles relaxed.  Hold this position for __________ seconds. Slowly return to the starting position and allow your muscles to relax completely before completing the next repetition. Repeat __________ times. Complete this exercise __________ times per day.  STRENGTH - Scapular Depressors  Find a sturdy chair without wheels.  Keeping your feet on the floor, lift your bottom from the seat and lock your elbows.  Keeping your elbows straight, allow gravity to pull your body weight down. Your shoulders will rise toward your ears.  Raise your body against gravity by drawing your shoulder blades down  your back, shortening the distance between your shoulders and ears. Although your feet should always maintain contact with the floor, your feet should progressively support less body weight as you get stronger.  Hold __________ seconds. In a controlled and slow manner, lower your body weight to begin the next repetition. Repeat __________ times. Complete this exercise __________ times per day.  STRENGTH - Horizontal Adductors  Secure a rubber exercise band/tubing so that it is at the height of your shoulders when you are either standing or sitting on a firm arm-less chair.  Turn away from the secured band/tube so it is directly behind you. Grasp an end of the band/tubing in each hand and have your palms face each other. Step forward until the end of band/tubing until it becomes tense.  Keeping your arms at your sides, lift your elbows so they are 90 degrees from your body. Your arms should be slightly bent.  Keeping your arms elevated 90 degrees, draw your palms together  Hold __________ seconds. Slowly ease the tension on the band/tubing as you reverse the directions and return to the starting position. Repeat __________ times. Complete this exercise __________ times per day.   This information is not intended to replace advice given to you by your health care provider. Make sure you discuss any questions you have with your health care provider.   Document Released: 11/19/2005 Document Revised: 04/05/2015 Document Reviewed: 03/03/2009 Elsevier Interactive Patient Education [employer].

## 2016-02-06 NOTE — Progress Notes (Signed)
Patient ID: Tamara [name], female   DOB: 1971-06-13, 45 y.o.   MRN: [id number]   Tamara [name], is a 45 y.o. female  BPZ:[id number]  EUM:[id number]  DOB - 09-30-1971  Chief Complaint  Patient presents with  . Back Pain        Subjective:   Tamara [name] is a 45 y.o. female here today for an urgent care visit. Patient went hiking with her children over the weekend and came home yesterday with severe upper back pain. She woke up this morning and couldn't use her hands because of pain, she went to work and was terrified because she could not carry out her normal functions, she presented to the clinic today with pain. She has no physical injury. There is no redness, no open wounds, no joint swelling. Patient has No headache, No chest pain, No abdominal pain - No Nausea, No new weakness tingling or numbness, No Cough - SOB. 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 Patient completed chemotherapy 12/30/2015. Patient is being followed by Dr Jana Hakim of [facility] for treatment and surveillance of Breast Cancer. Patient also had hysterectomy and bilateral salpingo-oophorectomy in April 2016. Patient is being followed by Dr. Rogelia Mire of [facility] for cardiomyopathy next appointment is 01/10/2016.  No problems updated.  ALLERGIES: Allergies  Allergen Reactions  . Compazine [Prochlorperazine Edisylate] Other (See Comments)    Stuttering    PAST MEDICAL HISTORY: Past Medical History  Diagnosis Date  . Endometriosis   . Hypertension   . Anemia   . Wears glasses   . Iron deficiency anemia, unspecified 02/25/2014  . BRCA1 positive     c.190T>G (p.Cys64Gly) @ [facility]   . History of blood transfusion     last 9'15. due to chemotherapy.  Marland Kitchen DVT (deep venous thrombosis) (HCC)     Right Subclavian and IJ.. Lovenox stopped 01-10-15 until after surgery planned 02-08-15.  . Fibroid  tumor     02-02-15 remains with abdominal pain and some vaginal bleeding due to fibroids.  . Breast cancer ([place]) 02/01/14    ER-/PR-/Her2+, still receiving chemo 12/21/14, last Herceptin 02-02-15.Left breat cancer.  . Shortness of breath      when Hemoglobin low, now resolved after transfusions 9'15.  . Rash 02/22/2015  . PONV (postoperative nausea and vomiting)     needs scop patch    MEDICATIONS AT HOME: Prior to Admission medications   Medication Sig Start Date End Date Taking? Authorizing Provider  acetaminophen (TYLENOL) 325 MG tablet Take 2 tablets (650 mg total) by mouth every 6 (six) hours as needed for mild pain (or Temp > 100). 07/30/14  Yes Shawn M Rayburn, PA-C  Biotin 5000 MCG TABS Take 10,000 mcg by mouth daily.   Yes Historical Provider, MD  carvedilol (COREG) 12.5 MG tablet Take 1.5 tablets (18.75 mg total) by mouth 2 (two) times daily with a meal. 01/05/16  Yes Kynzlee Hucker E Doreene Burke, MD  ferrous sulfate 325 (65 FE) MG tablet Take 1 tablet (325 mg total) by mouth 3 (three) times daily with meals. 04/11/15  Yes Tresa Garter, MD  gabapentin (NEURONTIN) 100 MG capsule Take 1 capsule (100 mg total) by mouth 3 (three) times daily. 01/05/16  Yes Tresa Garter, MD  hydrALAZINE (APRESOLINE) 50 MG tablet Take 1 tablet (50 mg total) by mouth every 8 (eight) hours. 03/01/15  Yes Jolaine Artist, MD  hydrochlorothiazide (MICROZIDE) 12.5 MG capsule  Take 1 capsule (12.5 mg total) by mouth daily. 01/05/16  Yes Tresa Garter, MD  lisinopril (PRINIVIL,ZESTRIL) 40 MG tablet Take 1 tablet (40 mg total) by mouth daily. 01/05/16  Yes Jaedynn Bohlken Essie Christine, MD  LORazepam (ATIVAN) 0.5 MG tablet Take 0.5 mg by mouth every 8 (eight) hours as needed for anxiety or sleep.   Yes Historical Provider, MD  ondansetron (ZOFRAN) 8 MG tablet Take 8 mg by mouth 2 (two) times daily as needed for nausea or vomiting. Reported on 01/10/2016   Yes Historical Provider, MD  pantoprazole (PROTONIX) 40 MG tablet Take 1  tablet (40 mg total) by mouth daily. 01/05/16  Yes Tresa Garter, MD  acetaminophen-codeine (TYLENOL #3) 300-30 MG tablet Take 1 tablet by mouth every 4 (four) hours as needed. 02/06/16   Tresa Garter, MD  cyclobenzaprine (FLEXERIL) 10 MG tablet Take 1 tablet (10 mg total) by mouth 3 (three) times daily as needed for muscle spasms. 02/06/16   Tresa Garter, MD  diphenhydrAMINE (BENADRYL) 25 MG tablet Take 1 tablet (25 mg total) by mouth every 6 (six) hours. Patient not taking: Reported on 01/05/2016 07/10/15   Delsa Grana, PA-C  guaiFENesin-dextromethorphan (ROBITUSSIN DM) 100-10 MG/5ML syrup Take 5 mLs by mouth every 4 (four) hours as needed for cough. Patient not taking: Reported on 02/06/2016 01/05/16   Tresa Garter, MD     Objective:   Filed Vitals:   02/06/16 1143  BP: 139/82  Pulse: 63  Temp: 98.1 F (36.7 C)  TempSrc: Oral  Resp: 18  Height: 5' 6"  (1.676 m)  Weight: 217 lb 12.8 oz (98.793 kg)  SpO2: 100%    Exam General appearance : Awake, alert, not in any distress. Speech Clear. Not toxic looking HEENT: Atraumatic and Normocephalic, pupils equally reactive to light and accomodation Neck: supple, no JVD. No cervical lymphadenopathy.  Chest:Good air entry bilaterally, no added sounds  CVS: S1 S2 regular, no murmurs.  Abdomen: Bowel sounds present, Non tender and not distended with no gaurding, rigidity or rebound. Extremities: B/L Lower Ext shows no edema, both legs are warm to touch. Tenderness on and around the left scapular area Neurology: Awake alert, and oriented X 3, CN II-XII intact, Non focal Skin:No Rash  Data Review Lab Results  Component Value Date   HGBA1C 5.0 01/29/2014     Assessment & Plan   1. Essential hypertension, benign  We have discussed target BP range and blood pressure goal. I have advised patient to check BP regularly and to call us back or report to clinic if the numbers are consistently higher than 140/90. We discussed the  importance of compliance with medical therapy and DASH diet recommended, consequences of uncontrolled hypertension discussed.   - continue current BP medications  2. Subscapularis (muscle) sprain, left, initial encounter  - acetaminophen-codeine (TYLENOL #3) 300-30 MG tablet; Take 1 tablet by mouth every 4 (four) hours as needed.  Dispense: 30 tablet; Refill: 0 - cyclobenzaprine (FLEXERIL) 10 MG tablet; Take 1 tablet (10 mg total) by mouth 3 (three) times daily as needed for muscle spasms.  Dispense: 30 tablet; Refill: 0  Patient have been counseled extensively about nutrition and exercise  Return if symptoms worsen or fail to improve, for Follow up HTN, Follow up Pain and comorbidities.  The patient was given clear instructions to go to ER or return to medical center if symptoms don't improve, worsen or new problems develop. The patient verbalized understanding. The patient was told to call  to get lab results if they haven't heard anything in the next week.   This note has been created with Surveyor, quantity. Any transcriptional errors are unintentional.    Angelica Chessman, MD, [place], Karilyn Cota, [place] and [facility], [place]   02/06/2016, 11:55 AM

## 2016-02-06 NOTE — Progress Notes (Signed)
Patient is here for Back Pain  Patient complains of upper back pain on the left shoulder blade. Pain is scaled currently at a 10. Pain began at home yesterday after a [place]ren[place] patients [place].  Patient used OTC icy-hot and family massages with no relief.  Patient declined flu shot today.

## 2016-02-07 ENCOUNTER — Encounter: Payer: Self-pay | Admitting: Internal Medicine

## 2016-02-08 ENCOUNTER — Encounter (HOSPITAL_COMMUNITY): Payer: Self-pay

## 2016-02-08 ENCOUNTER — Emergency Department (HOSPITAL_COMMUNITY): Payer: Medicaid Other

## 2016-02-08 ENCOUNTER — Emergency Department (HOSPITAL_COMMUNITY)
Admission: EM | Admit: 2016-02-08 | Discharge: 2016-02-08 | Disposition: A | Discharge status: Home / Self Care | Payer: Medicaid Other | Attending: Emergency Medicine | Admitting: Emergency Medicine

## 2016-02-08 DIAGNOSIS — X58XXXA Exposure to other specified factors, initial encounter: Secondary | ICD-10-CM | POA: Insufficient documentation

## 2016-02-08 DIAGNOSIS — S4992XA Unspecified injury of left shoulder and upper arm, initial encounter: Secondary | ICD-10-CM | POA: Diagnosis present

## 2016-02-08 DIAGNOSIS — Z8742 Personal history of other diseases of the female genital tract: Secondary | ICD-10-CM | POA: Diagnosis not present

## 2016-02-08 DIAGNOSIS — Y9289 Other specified places as the place of occurrence of the external cause: Secondary | ICD-10-CM | POA: Insufficient documentation

## 2016-02-08 DIAGNOSIS — Z87891 Personal history of nicotine dependence: Secondary | ICD-10-CM | POA: Diagnosis not present

## 2016-02-08 DIAGNOSIS — Z9889 Other specified postprocedural states: Secondary | ICD-10-CM | POA: Diagnosis not present

## 2016-02-08 DIAGNOSIS — Z86018 Personal history of other benign neoplasm: Secondary | ICD-10-CM | POA: Insufficient documentation

## 2016-02-08 DIAGNOSIS — Z853 Personal history of malignant neoplasm of breast: Secondary | ICD-10-CM | POA: Diagnosis not present

## 2016-02-08 DIAGNOSIS — S46912A Strain of unspecified muscle, fascia and tendon at shoulder and upper arm level, left arm, initial encounter: Secondary | ICD-10-CM | POA: Insufficient documentation

## 2016-02-08 DIAGNOSIS — M25512 Pain in left shoulder: Secondary | ICD-10-CM

## 2016-02-08 DIAGNOSIS — Z79899 Other long term (current) drug therapy: Secondary | ICD-10-CM | POA: Diagnosis not present

## 2016-02-08 DIAGNOSIS — I10 Essential (primary) hypertension: Secondary | ICD-10-CM | POA: Diagnosis not present

## 2016-02-08 DIAGNOSIS — Z86718 Personal history of other venous thrombosis and embolism: Secondary | ICD-10-CM | POA: Diagnosis not present

## 2016-02-08 DIAGNOSIS — T148XXA Other injury of unspecified body region, initial encounter: Secondary | ICD-10-CM

## 2016-02-08 DIAGNOSIS — D509 Iron deficiency anemia, unspecified: Secondary | ICD-10-CM | POA: Diagnosis not present

## 2016-02-08 DIAGNOSIS — Y998 Other external cause status: Secondary | ICD-10-CM | POA: Diagnosis not present

## 2016-02-08 DIAGNOSIS — Y9389 Activity, other specified: Secondary | ICD-10-CM | POA: Diagnosis not present

## 2016-02-08 DIAGNOSIS — R200 Anesthesia of skin: Secondary | ICD-10-CM | POA: Diagnosis not present

## 2016-02-08 MED ORDER — KETOROLAC TROMETHAMINE 30 MG/ML IJ SOLN
30.0000 mg | Freq: Once | INTRAMUSCULAR | Status: AC
  Administered 2016-02-08: 30 mg via INTRAMUSCULAR
  Filled 2016-02-08: qty 1

## 2016-02-08 MED ORDER — IBUPROFEN 800 MG PO TABS: 800.0000 mg | ORAL_TABLET | Freq: Three times a day (TID) | ORAL | Status: DC

## 2016-02-08 MED ORDER — OXYCODONE-ACETAMINOPHEN 5-325 MG PO TABS
1.0000 | ORAL_TABLET | Freq: Once | ORAL | Status: AC
  Administered 2016-02-08: 1 via ORAL
  Filled 2016-02-08: qty 1

## 2016-02-08 NOTE — ED Notes (Signed)
Pain medication given in Triage. Patient advised about side effects of medications and  to avoid driving for a minimum of 4 hours.  

## 2016-02-08 NOTE — ED Notes (Addendum)
Pt c/o intermittent L shoulder pain/spasms x "a while"  Increasing x 3-4 days.  Pain score 10/10.  Denies injury.  Pain increases w/ movement.  Pt reports taking prescribed muscle relaxers and tylenol 3.  Pt reports being seen by PCP x 2 days ago and diagnosed w/ Subscapularis disruption.  Pt sts the prescribed medication is not helping.  Hx of breast CA w/ double mastectomy in 07/2014.

## 2016-02-08 NOTE — ED Provider Notes (Signed)
CSN: [id number]     Arrival date & time 02/08/16  1314 History  By signing my name below, I, Tamara [name], attest that this documentation has been prepared under the direction and in the presence of Tamara [name], [place]. Electronically Signed: Rayna [name], ED Scribe. 02/08/2016. 3:00 PM.    Chief Complaint  Patient presents with  . Shoulder Pain   The history is provided by the patient. No language interpreter was used.   HPI Comments: Tamara [name] is a 45 y.o. female with a PMHx including breast cancer who presents to the Emergency Department complaining of constant, moderate, sharp, left posterior shoulder pain onset 3 days ago. She was hiking 3 days ago and was using her arms to climb and began experiencing her pain shortly thereafter. Pt notes a hx of similar symptoms but denies any of this severity. Her pain worsens with movement of the left arm or deep breathing. She reports chronic, intermittent, axillary numbness s/p a double mastectomy that was performed in 07/2014 but denies any changes. Pt was seen by Dr. Doreene Burke at [facility] 2 days ago with the same complaint and dx with subscapularis disruption and discharged with rx Tylenol #3 and flexeril without any significant relief. Pt denies any other associated symptoms at this time. Patient denies any other recent fall, trauma, injury.   Past Medical History  Diagnosis Date  . Endometriosis   . Hypertension   . Anemia   . Wears glasses   . Iron deficiency anemia, unspecified 02/25/2014  . BRCA1 positive     c.190T>G (p.Cys64Gly) @ [facility]   . History of blood transfusion     last 9'15. due to chemotherapy.  Marland Kitchen DVT (deep venous thrombosis) (HCC)     Right Subclavian and IJ.. Lovenox stopped 01-10-15 until after surgery planned 02-08-15.  . Fibroid tumor     02-02-15 remains with abdominal pain and some vaginal bleeding due to fibroids.  . Breast cancer ([place]) 02/01/14    ER-/PR-/Her2+, still receiving chemo  12/21/14, last Herceptin 02-02-15.Left breat cancer.  . Shortness of breath      when Hemoglobin low, now resolved after transfusions 9'15.  . Rash 02/22/2015  . PONV (postoperative nausea and vomiting)     needs scop patch   Past Surgical History  Procedure Laterality Date  . Cervical polypectomy  2010  . Cesarean section      one previous  . Tubal ligation    . Portacath placement Right 02/23/2014    Procedure: INSERTION PORT-A-CATH;  Surgeon: Joyice Faster. Cornett, MD;  Location: [place];  Service: General;  Laterality: Right;  . Port-a-cath removal N/A 05/21/2014    Procedure: REMOVAL PORT-A-CATH;  Surgeon: Zenovia Jarred, MD;  Location: [place];  Service: General;  Laterality: N/A;  . Tee without cardioversion N/A 05/26/2014    Procedure: TRANSESOPHAGEAL ECHOCARDIOGRAM (TEE);  Surgeon: Larey Dresser, MD;  Location[facility];  Service: Cardiovascular;  Laterality: N/A;  . Bilateral total mastectomy with axillary lymph node dissection Bilateral 07/28/2014    Procedure: BILATERAL TOTAL MASTECTOMY WITH LEFT AXILLARY SENTINEL LYMPH NODE BIOPSY;  Surgeon: Stark Klein, MD;  Location: [place];  Service: General;  Laterality: Bilateral;  . Breast reconstruction with placement of tissue expander and flex hd (acellular hydrated dermis) Bilateral 07/28/2014    Procedure: BILATERAL BREAST RECONSTRUCTION WITH PLACEMENT OF TISSUE EXPANDER AND FLEX HD (ACELLULAR HYDRATED DERMIS);  Surgeon: Theodoro Kos, DO;  Location: [place];  Service: Plastics;  Laterality: Bilateral;  .  Removal of tissue expander and placement of implant Bilateral 10/14/2014    Procedure: REMOVAL OF BILATERAL BREAST  TISSUE EXPANDER  WITH PLASCEMENT OF BILATERAL  BREAST IMPLANTS;  Surgeon: Theodoro Kos, DO;  Location: [place];  Service: Plastics;  Laterality: Bilateral;  . Left heart catheterization with coronary angiogram N/A 09/13/2014    Procedure: LEFT HEART CATHETERIZATION WITH CORONARY ANGIOGRAM;   Surgeon: Jolaine Artist, MD;  Location: [facility] CATH LAB;  Service: Cardiovascular;  Laterality: N/A;  . Cardiac catheterization      10'15- Dr. Sung Amabile  . Robotic assisted total hysterectomy with bilateral salpingo oopherectomy Bilateral 02/08/2015    Procedure: ROBOTIC ASSISTED TOTAL HYSTERECTOMY WITH BILATERAL SALPINGO OOPHORECTOMY; UTERUS WEIGHING GREATER THAN 250 GRAMS;  Surgeon: Everitt Amber, MD;  Location: [facility];  Service: Gynecology;  Laterality: Bilateral;  BRCA 1 GENE MUTATION  . Breast reconstruction Bilateral 03/31/2015    Procedure: BILATERAL BREAST RECONSTRUCTION REVISION WITH BILATERAL LIPOFILLING;  Surgeon: Theodoro Kos, DO;  Location: [place];  Service: Plastics;  Laterality: Bilateral;  . Liposuction with lipofilling Bilateral 03/31/2015    Procedure: LIPOSUCTION WITH LIPOFILLING;  Surgeon: Theodoro Kos, DO;  Location: [place];  Service: Plastics;  Laterality: Bilateral;   Family History  Problem Relation Age of Onset  . Breast cancer Mother 66    currently 41  . Diabetes Father   . Pancreatic cancer Father 24  . Breast cancer Paternal Aunt 23    currently 15; BRCA1 positive  . Stroke Maternal Grandfather   . Cancer Paternal Aunt     unk. primary; deceased 71s  . Breast cancer Cousin     daughter of unaffected paternal aunt; dx in her 65s   Social History  Substance Use Topics  . Smoking status: Former Smoker -- 0.25 packs/day for 15 years    Quit date: 02/18/2009  . Smokeless tobacco: Former Systems developer  . Alcohol Use: No     Comment: occasional   OB History    Gravida Para Term Preterm AB TAB SAB Ectopic Multiple Living   5 5 5       5      Review of Systems  Musculoskeletal: Positive for myalgias and arthralgias.  Skin: Negative for color change and wound.  Neurological: Positive for numbness (chronic).    Allergies  Compazine  Home Medications   Prior to Admission medications   Medication Sig Start Date End Date Taking?  Authorizing Provider  acetaminophen (TYLENOL) 325 MG tablet Take 2 tablets (650 mg total) by mouth every 6 (six) hours as needed for mild pain (or Temp > 100). 07/30/14   Shawn M Rayburn, PA-C  acetaminophen-codeine (TYLENOL #3) 300-30 MG tablet Take 1 tablet by mouth every 4 (four) hours as needed. 02/06/16   Tresa Garter, MD  Biotin 5000 MCG TABS Take 10,000 mcg by mouth daily.    Historical Provider, MD  carvedilol (COREG) 12.5 MG tablet Take 1.5 tablets (18.75 mg total) by mouth 2 (two) times daily with a meal. 01/05/16   Tresa Garter, MD  cyclobenzaprine (FLEXERIL) 10 MG tablet Take 1 tablet (10 mg total) by mouth 3 (three) times daily as needed for muscle spasms. 02/06/16   Tresa Garter, MD  diphenhydrAMINE (BENADRYL) 25 MG tablet Take 1 tablet (25 mg total) by mouth every 6 (six) hours. Patient not taking: Reported on 01/05/2016 07/10/15   Delsa Grana, PA-C  ferrous sulfate 325 (65 FE) MG tablet Take 1 tablet (325 mg total) by  mouth 3 (three) times daily with meals. 04/11/15   Tresa Garter, MD  gabapentin (NEURONTIN) 100 MG capsule Take 1 capsule (100 mg total) by mouth 3 (three) times daily. 01/05/16   Tresa Garter, MD  guaiFENesin-dextromethorphan (ROBITUSSIN DM) 100-10 MG/5ML syrup Take 5 mLs by mouth every 4 (four) hours as needed for cough. Patient not taking: Reported on 02/06/2016 01/05/16   Tresa Garter, MD  hydrALAZINE (APRESOLINE) 50 MG tablet Take 1 tablet (50 mg total) by mouth every 8 (eight) hours. 03/01/15   Jolaine Artist, MD  hydrochlorothiazide (MICROZIDE) 12.5 MG capsule Take 1 capsule (12.5 mg total) by mouth daily. 01/05/16   Tresa Garter, MD  ibuprofen (ADVIL,MOTRIN) 800 MG tablet Take 1 tablet (800 mg total) by mouth 3 (three) times daily. 02/08/16   Tamara Dell, PA-C  lisinopril (PRINIVIL,ZESTRIL) 40 MG tablet Take 1 tablet (40 mg total) by mouth daily. 01/05/16   Tresa Garter, MD  LORazepam (ATIVAN) 0.5 MG tablet Take 0.5  mg by mouth every 8 (eight) hours as needed for anxiety or sleep.    Historical Provider, MD  ondansetron (ZOFRAN) 8 MG tablet Take 8 mg by mouth 2 (two) times daily as needed for nausea or vomiting. Reported on 01/10/2016    Historical Provider, MD  pantoprazole (PROTONIX) 40 MG tablet Take 1 tablet (40 mg total) by mouth daily. 01/05/16   Tresa Garter, MD   BP 124/90 mmHg  Pulse 84  Temp(Src) 98.5 F (36.9 C) (Oral)  Resp 18  SpO2 98%  LMP 02/19/2014    Physical Exam  Constitutional: She is oriented to person, place, and time. She appears well-developed and well-nourished.  HENT:  Head: Normocephalic and atraumatic.  Eyes: Conjunctivae and EOM are normal. Right eye exhibits no discharge. Left eye exhibits no discharge. No scleral icterus.  Neck: Normal range of motion.  Cardiovascular: Normal rate.   Pulmonary/Chest: Effort normal. No respiratory distress.  Abdominal: Soft.  Musculoskeletal: Normal range of motion. She exhibits tenderness. She exhibits no edema.  FROM of left shoulder, elbow, wrist and hand however pt endorses pain in left posterior shoulder. 5/5 strength. Sensation intact. 2+ radial pulse. Cap refill <2. Equal grip strength bilaterally. No CTL midline tenderness. TTP over left rhomboids and posterior scapula. FROM of neck and back.   Neurological: She is alert and oriented to person, place, and time.  Skin: Skin is warm and dry.  Psychiatric: She has a normal mood and affect.  Nursing note and vitals reviewed.   ED Course  Procedures  DIAGNOSTIC STUDIES: Oxygen Saturation is 96% on RA, normal by my interpretation.    COORDINATION OF CARE: 2:57 PM Discussed next steps with pt. She verbalized understanding and is agreeable with the plan.   Labs Review Labs Reviewed - No data to display  Imaging Review Dg Shoulder Left  02/08/2016  CLINICAL DATA:  Left shoulder pain for 4 days. No known injury. Initial encounter. EXAM: LEFT SHOULDER - 2+ VIEW COMPARISON:   None. FINDINGS: There is no evidence of fracture or dislocation. There is no evidence of arthropathy or other focal bone abnormality. Surgical clips left axilla are noted. IMPRESSION: Negative exam. Electronically Signed   By: Inge Rise M.D.   On: 02/08/2016 13:56   I have personally reviewed and evaluated these images as part of my medical decision-making.   EKG Interpretation None      MDM   Final diagnoses:  Left shoulder pain  Muscle strain  Patient presents with left shoulder pain after climbing and hiking 4 days ago. She notes she was seen by her PCP 2 days ago, diagnosed with subscapularis disruption and sent home with Tylenol 3 and Flexeril. VSS. Exam revealed tenderness over left rhomboids and posterior scapula, patient reports pain with full range of motion of left shoulder, left arm otherwise neurovascularly intact. Left shoulder x-ray negative. Patient given IM Toradol in the ED. I suspect patient's symptoms are likely due to muscle strain associated with recent overuse and new activity (hiking and climbing). Plan to discharge patient home with NSAIDs and symptomatic treatment. Advised patient to continue using her Flexeril as needed. Advised patient to follow up with her PCP in 5-7 days if symptoms have not improved.  I personally performed the services described in this documentation, which was scribed in my presence. The recorded information has been reviewed and is accurate.    Chesley Noon [place], [place] 02/08/16 1729  Virgel Manifold, MD 02/09/16 [phone]

## 2016-02-08 NOTE — Discharge Instructions (Signed)
I recommend taking your ibuprofen prescription as prescribed, take the prescription after eating to avoid side effects. You may continue taking your muscle relaxant as prescribed by your primary care provider. I recommend continuing to apply ice to your left shoulder for 15-20 minutes 3-4 times daily. Refrain from doing any heavy lifting, excessive exercising or repetitive movements that exacerbate your symptoms. Follow-up with your primary care provider in 5-7 days if your symptoms have not improved. Return to the emergency department if symptoms worsen or new onset of fever, swelling, numbness, tingling, weakness.

## 2016-03-12 ENCOUNTER — Other Ambulatory Visit: Payer: Self-pay | Admitting: *Deleted

## 2016-03-12 DIAGNOSIS — C50412 Malignant neoplasm of upper-outer quadrant of left female breast: Secondary | ICD-10-CM

## 2016-03-13 ENCOUNTER — Other Ambulatory Visit (HOSPITAL_BASED_OUTPATIENT_CLINIC_OR_DEPARTMENT_OTHER): Payer: Medicaid Other

## 2016-03-13 ENCOUNTER — Encounter: Payer: Self-pay | Admitting: Nurse Practitioner

## 2016-03-13 ENCOUNTER — Ambulatory Visit (HOSPITAL_BASED_OUTPATIENT_CLINIC_OR_DEPARTMENT_OTHER): Payer: Medicaid Other | Admitting: Nurse Practitioner

## 2016-03-13 ENCOUNTER — Telehealth: Payer: Self-pay | Admitting: Nurse Practitioner

## 2016-03-13 VITALS — BP 127/89 | HR 58 | Temp 98.2°F | Resp 17 | Ht 66.0 in | Wt 214.5 lb

## 2016-03-13 DIAGNOSIS — C50912 Malignant neoplasm of unspecified site of left female breast: Secondary | ICD-10-CM

## 2016-03-13 DIAGNOSIS — C50911 Malignant neoplasm of unspecified site of right female breast: Secondary | ICD-10-CM

## 2016-03-13 DIAGNOSIS — C773 Secondary and unspecified malignant neoplasm of axilla and upper limb lymph nodes: Secondary | ICD-10-CM | POA: Diagnosis not present

## 2016-03-13 DIAGNOSIS — Z1501 Genetic susceptibility to malignant neoplasm of breast: Secondary | ICD-10-CM | POA: Diagnosis not present

## 2016-03-13 DIAGNOSIS — C50412 Malignant neoplasm of upper-outer quadrant of left female breast: Secondary | ICD-10-CM

## 2016-03-13 LAB — COMPREHENSIVE METABOLIC PANEL
ALT: 15 U/L (ref 0–55)
AST: 15 U/L (ref 5–34)
Albumin: 3.8 g/dL (ref 3.5–5.0)
Alkaline Phosphatase: 89 U/L (ref 40–150)
Anion Gap: 7 mEq/L (ref 3–11)
BUN: 17.4 mg/dL (ref 7.0–26.0)
CHLORIDE: 110 meq/L — AB (ref 98–109)
CO2: 26 meq/L (ref 22–29)
CREATININE: 1 mg/dL (ref 0.6–1.1)
Calcium: 9.3 mg/dL (ref 8.4–10.4)
EGFR: 85 mL/min/{1.73_m2} — AB (ref >90)
GLUCOSE: 92 mg/dL (ref 70–140)
Potassium: 4.2 mEq/L (ref 3.5–5.1)
SODIUM: 142 meq/L (ref 136–145)
TOTAL PROTEIN: 7 g/dL (ref 6.4–8.3)

## 2016-03-13 LAB — CBC WITH DIFFERENTIAL/PLATELET
BASO%: 0.8 % (ref 0.0–2.0)
Basophils Absolute: 0.1 10*3/uL (ref 0.0–0.1)
EOS%: 3.6 % (ref 0.0–7.0)
Eosinophils Absolute: 0.3 10*3/uL (ref 0.0–0.5)
HCT: 37.8 % (ref 34.8–46.6)
HGB: 12.2 g/dL (ref 11.6–15.9)
LYMPH%: 29.7 % (ref 14.0–49.7)
MCH: 29.1 pg (ref 25.1–34.0)
MCHC: 32.4 g/dL (ref 31.5–36.0)
MCV: 89.8 fL (ref 79.5–101.0)
MONO#: 0.6 10*3/uL (ref 0.1–0.9)
MONO%: 7.4 % (ref 0.0–14.0)
NEUT%: 58.5 % (ref 38.4–76.8)
NEUTROS ABS: 4.4 10*3/uL (ref 1.5–6.5)
Platelets: 273 10*3/uL (ref 145–400)
RBC: 4.21 10*6/uL (ref 3.70–5.45)
RDW: 13.5 % (ref 11.2–14.5)
WBC: 7.6 10*3/uL (ref 3.9–10.3)
lymph#: 2.3 10*3/uL (ref 0.9–3.3)

## 2016-03-13 NOTE — Progress Notes (Signed)
ID: Su Grand OB: 05/25/1971  MR#: [id number]  CSN#:[id number]  PCP: Angelica Chessman, MD GYN:   SU: Dr. Erroll Luna OTHER MD: Dr. 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 There was also an 8 mm mass 8 cm from the nipple in the ipsilateral breast, as well as positive lymph nodes. 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 MRI confirmed 2.8 and 1.3 cm mass is as well as lymph node. On the right a 6 mm nodule, biopsied and negative for malignancy. PET scan 0-26-37 had hypermetabolic left breast mass consistent with known neoplasm and FDG positive left axillary lymph nodes,benign-appearing brown fat activity and muscular activity in the  neck and chest but no findings for metastatic disease involving the neck, chest, abdomen, pelvis or bones. Moderate FDG activity in the endometrial canal is thought likely due to secretory phase of ovulation or menses. No mass, uterine fibroids present. CT [facility] 02-19-14 had 3 cm left breast mass, enlarged left axillary lymph nodes are positive and no CT findings for metastatic disease involving the chest,abdomen or pelvis and no evidence of osseous metastatic disease. Mildly enlarged fibroid uterus."   Her subsequent treatment is as detailed below   INTERVAL HISTORY:   Aleia returns today for follow up of her HER-2 positive  breast cancer. The interval history is remarkable for a left shoulder strain as a result of pulling a total care patient in early March. She was given flexeril and a small quantity of tylenol #3 for the pain. It is better now, but she can feel it "twinge" if she performs activities that rely on shoulder strength. She is looking to apply for a new job because the physical work is becoming a burden.   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 She wears a sleeve while at work. She has left leg numbness, possibly sciatic pain, and she takes 173m gabapentin TID. She has be careful with daytime use of this drug because it does make her drowsy. Her right leg is unaffected. She denies fevers, chills, nausea, or vomiting. She is moving her bowels well. Her appetite is good. She is regularly physically active. She thinks she is allergic to some of the trees that are blooming now. She has a dry cough and some sinus irritation. She denies shortness of breath, chest pain, cough, or palpitations. A detailed review of systems is otherwise stable.  PAST MEDICAL HISTORY: Past Medical History  Diagnosis Date  . Endometriosis   . Hypertension   . Anemia   . Wears glasses   . Iron deficiency anemia, unspecified 02/25/2014  . BRCA1 positive     c.190T>G (p.Cys64Gly) @ [facility]   . History of blood transfusion     last 9'15. due to chemotherapy.  .Marland KitchenDVT (deep venous thrombosis) (HCC)     Right Subclavian and IJ.. Lovenox stopped 01-10-15 until after surgery planned 02-08-15.  . Fibroid tumor     02-02-15 remains  with abdominal pain and some vaginal bleeding due to fibroids.  . Breast cancer ([place]) 02/01/14    ER-/PR-/Her2+, still receiving chemo 12/21/14, last Herceptin 02-02-15.Left breat cancer.  . Shortness of breath      when Hemoglobin low, now resolved after transfusions 9'15.  . Rash 02/22/2015  . PONV (postoperative nausea and vomiting)     needs scop patch    PAST SURGICAL HISTORY: Past  Surgical History  Procedure Laterality Date  . Cervical polypectomy  2010  . Cesarean section      one previous  . Tubal ligation    . Portacath placement Right 02/23/2014    Procedure: INSERTION PORT-A-CATH;  Surgeon: Joyice Faster. Cornett, MD;  Location: [place];  Service: General;  Laterality: Right;  . Port-a-cath removal N/A 05/21/2014    Procedure: REMOVAL PORT-A-CATH;  Surgeon: Zenovia Jarred, MD;  Location: [place];  Service: General;  Laterality: N/A;  . Tee without cardioversion N/A 05/26/2014    Procedure: TRANSESOPHAGEAL ECHOCARDIOGRAM (TEE);  Surgeon: Larey Dresser, MD;  Location: [facility] ENDOSCOPY;  Service: Cardiovascular;  Laterality: N/A;  . Bilateral total mastectomy with axillary lymph node dissection Bilateral 07/28/2014    Procedure: BILATERAL TOTAL MASTECTOMY WITH LEFT AXILLARY SENTINEL LYMPH NODE BIOPSY;  Surgeon: Stark Klein, MD;  Location: [place];  Service: General;  Laterality: Bilateral;  . Breast reconstruction with placement of tissue expander and flex hd (acellular hydrated dermis) Bilateral 07/28/2014    Procedure: BILATERAL BREAST RECONSTRUCTION WITH PLACEMENT OF TISSUE EXPANDER AND FLEX HD (ACELLULAR HYDRATED DERMIS);  Surgeon: Theodoro Kos, DO;  Location: [place];  Service: Plastics;  Laterality: Bilateral;  . Removal of tissue expander and placement of implant Bilateral 10/14/2014    Procedure: REMOVAL OF BILATERAL BREAST  TISSUE EXPANDER  WITH PLASCEMENT OF BILATERAL  BREAST IMPLANTS;  Surgeon: Theodoro Kos, DO;  Location: [place];  Service: Plastics;  Laterality: Bilateral;  . Left heart catheterization with coronary angiogram N/A 09/13/2014    Procedure: LEFT HEART CATHETERIZATION WITH CORONARY ANGIOGRAM;  Surgeon: Jolaine Artist, MD;  Location: [facility] CATH LAB;  Service: Cardiovascular;  Laterality: N/A;  . Cardiac catheterization      10'15- Dr. Sung Amabile  . Robotic assisted total hysterectomy with bilateral salpingo oopherectomy  Bilateral 02/08/2015    Procedure: ROBOTIC ASSISTED TOTAL HYSTERECTOMY WITH BILATERAL SALPINGO OOPHORECTOMY; UTERUS WEIGHING GREATER THAN 250 GRAMS;  Surgeon: Everitt Amber, MD;  Location: [facility];  Service: Gynecology;  Laterality: Bilateral;  BRCA 1 GENE MUTATION  . Breast reconstruction Bilateral 03/31/2015    Procedure: BILATERAL BREAST RECONSTRUCTION REVISION WITH BILATERAL LIPOFILLING;  Surgeon: Theodoro Kos, DO;  Location: [place];  Service: Plastics;  Laterality: Bilateral;  . Liposuction with lipofilling Bilateral 03/31/2015    Procedure: LIPOSUCTION WITH LIPOFILLING;  Surgeon: Theodoro Kos, DO;  Location: [place];  Service: Plastics;  Laterality: Bilateral;    FAMILY HISTORY Family History  Problem Relation Age of Onset  . Breast cancer Mother 94    currently 48  . Diabetes Father   . Pancreatic cancer Father 52  . Breast cancer Paternal Aunt 70    currently 31; BRCA1 positive  . Stroke Maternal Grandfather   . Cancer Paternal Aunt     unk. primary; deceased 41s  . Breast cancer Cousin     daughter of unaffected paternal aunt; dx in her 39s  [name] father died from prostate cancer the age of 70. [name] mother was diagnosed with breast cancer  the age of 52. [name] father had 5 sisters, 3 of whom were diagnosed with breast cancer, 2 of them before the age of 73. The patient had one brother, no sisters. There is no history of ovarian cancer in the family.  GYNECOLOGIC HISTORY:  Menarche age 83, first live birth age 62, the patient is [place] P5. Her periods stopped at the time of chemotherapy. 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 Her (former) husband [name] works as an Animal nutritionist. Their divorce was finalized September 2016. [name] oldest child, a son, [name], is studying Therapist, occupational; the patient is a  72 year old daughter [name] is also in college. [name] younger children are 80, 49, and 70. The patient attends a [employer]   ADVANCED DIRECTIVES: Not in place; at the 12/15/2015 visit the patient was given the appropriate forms to complete and notarize at her discretion   HEALTH MAINTENANCE: Social History  Substance Use Topics  . Smoking status: Former Smoker -- 0.25 packs/day for 15 years    Quit date: 02/18/2009  . Smokeless tobacco: Former Systems developer  . Alcohol Use: No     Comment: occasional   Colonoscopy: Bone Density Scan:  Pap Smear:  Eye Exam:  Vitamin D Level:   Lipid Panel:    Allergies  Allergen Reactions  . Compazine [Prochlorperazine Edisylate] Other (See Comments)    Stuttering    Current Outpatient Prescriptions  Medication Sig Dispense Refill  . Biotin 5000 MCG TABS Take 10,000 mcg by mouth daily.    . carvedilol (COREG) 12.5 MG tablet Take 1.5 tablets (18.75 mg total) by mouth 2 (two) times daily with a meal. 90 tablet 3  . ferrous sulfate 325 (65 FE) MG tablet Take 1 tablet (325 mg total) by mouth 3 (three) times daily with meals. 270 tablet 3  . gabapentin (NEURONTIN) 100 MG capsule Take 1 capsule (100 mg total) by mouth 3 (three) times daily. 90 capsule 3  . hydrALAZINE (APRESOLINE) 50 MG tablet Take 1 tablet (50 mg total) by mouth every 8 (eight) hours. 90 tablet 3  . hydrochlorothiazide (MICROZIDE) 12.5 MG capsule Take 1 capsule (12.5 mg total) by mouth daily. 90 capsule 3  . pantoprazole (PROTONIX) 40 MG tablet Take 1 tablet (40 mg total) by mouth daily. 30 tablet 3  . acetaminophen-codeine (TYLENOL #3) 300-30 MG tablet Take 1 tablet by mouth every 4 (four) hours as needed. (Patient not taking: Reported on 03/13/2016) 30 tablet 0  . cyclobenzaprine (FLEXERIL) 10 MG tablet Take 1 tablet (10 mg total) by mouth 3 (three) times daily as needed for muscle spasms. (Patient not taking: Reported on 03/13/2016) 30 tablet 0  . diphenhydrAMINE  (BENADRYL) 25 MG tablet Take 1 tablet (25 mg total) by mouth every 6 (six) hours. (Patient not taking: Reported on 01/05/2016) 20 tablet 0  . guaiFENesin-dextromethorphan (ROBITUSSIN DM) 100-10 MG/5ML syrup Take 5 mLs by mouth every 4 (four) hours as needed for cough. (Patient not taking: Reported on 02/06/2016) 480 mL 0  . ibuprofen (ADVIL,MOTRIN) 800 MG tablet Take 1 tablet (800 mg total) by mouth 3 (three) times daily. (Patient not taking: Reported on 03/13/2016) 21 tablet 0  . lisinopril (PRINIVIL,ZESTRIL) 40 MG tablet Take 1 tablet (40 mg total) by mouth daily. (Patient not taking: Reported on 03/13/2016) 90 tablet 3  . LORazepam (ATIVAN) 0.5 MG tablet Take 0.5 mg by mouth every 8 (  eight) hours as needed for anxiety or sleep. Reported on 03/13/2016     No current facility-administered medications for this visit.    OBJECTIVE: Young-appearing African American woman who appears well  Filed Vitals:   03/13/16 0905  BP: 127/89  Pulse: 58  Temp: 98.2 F (36.8 C)  Resp: 17     Body mass index is 34.64 kg/(m^2).      ECOG FS:0 - Asymptomatic  Skin: warm, dry  HEENT: sclerae anicteric, conjunctivae pink, oropharynx clear. No thrush or mucositis.  Lymph Nodes: No cervical or supraclavicular lymphadenopathy  Lungs: clear to auscultation bilaterally, no rales, wheezes, or rhonci  Heart: regular rate and rhythm  Abdomen: round, soft, non tender, positive bowel sounds  Musculoskeletal: No focal spinal tenderness, grade 1 left upper extremity lymphedema  Neuro: non focal, well oriented, positive affect  Breasts: deferred. Bilateral axillae benign.   LAB RESULTS:  CMP     Component Value Date/Time   NA 142 03/13/2016 0851   NA 140 09/04/2015 1827   K 4.2 03/13/2016 0851   K 3.5 09/04/2015 1827   CL 111 09/04/2015 1827   CO2 26 03/13/2016 0851   CO2 26 09/04/2015 1827   GLUCOSE 92 03/13/2016 0851   GLUCOSE 97 09/04/2015 1827   BUN 17.4 03/13/2016 0851   BUN 18 09/04/2015 1827    CREATININE 1.0 03/13/2016 0851   CREATININE 0.92 09/04/2015 1827   CALCIUM 9.3 03/13/2016 0851   CALCIUM 9.0 09/04/2015 1827   PROT 7.0 03/13/2016 0851   PROT 7.2 03/03/2015 2308   ALBUMIN 3.8 03/13/2016 0851   ALBUMIN 4.0 03/03/2015 2308   AST 15 03/13/2016 0851   AST 18 03/03/2015 2308   ALT 15 03/13/2016 0851   ALT 16 03/03/2015 2308   ALKPHOS 89 03/13/2016 0851   ALKPHOS 92 03/03/2015 2308   BILITOT <0.30 03/13/2016 0851   BILITOT 0.3 03/03/2015 2308   GFRNONAA >60 09/04/2015 1827   GFRAA >60 09/04/2015 1827    I No results found for: SPEP  Lab Results  Component Value Date   WBC 7.6 03/13/2016   NEUTROABS 4.4 03/13/2016   HGB 12.2 03/13/2016   HCT 37.8 03/13/2016   MCV 89.8 03/13/2016   PLT 273 03/13/2016        Chemistry      Component Value Date/Time   NA 142 03/13/2016 0851   NA 140 09/04/2015 1827   K 4.2 03/13/2016 0851   K 3.5 09/04/2015 1827   CL 111 09/04/2015 1827   CO2 26 03/13/2016 0851   CO2 26 09/04/2015 1827   BUN 17.4 03/13/2016 0851   BUN 18 09/04/2015 1827   CREATININE 1.0 03/13/2016 0851   CREATININE 0.92 09/04/2015 1827      Component Value Date/Time   CALCIUM 9.3 03/13/2016 0851   CALCIUM 9.0 09/04/2015 1827   ALKPHOS 89 03/13/2016 0851   ALKPHOS 92 03/03/2015 2308   AST 15 03/13/2016 0851   AST 18 03/03/2015 2308   ALT 15 03/13/2016 0851   ALT 16 03/03/2015 2308   BILITOT <0.30 03/13/2016 0851   BILITOT 0.3 03/03/2015 2308       No results found for: LABCA2  No components found for: [id number]  No results for input(s): INR in the last 168 hours.  Urinalysis    Component Value Date/Time   COLORURINE RED* 02/02/2015 1030   APPEARANCEUR CLOUDY* 02/02/2015 1030   LABSPEC 1.025 02/16/2015 1243   LABSPEC 1.024 02/02/2015 1030   PHURINE 6.0 02/16/2015 1243  PHURINE 6.0 02/02/2015 1030   GLUCOSEU Negative 02/16/2015 1243   GLUCOSEU NEGATIVE 02/02/2015 1030   HGBUR Negative 02/16/2015 1243   HGBUR LARGE* 02/02/2015  1030   BILIRUBINUR Negative 02/16/2015 [place] 02/02/2015 1030   KETONESUR 5 02/16/2015 1243   KETONESUR NEGATIVE 02/02/2015 1030   PROTEINUR < 30 02/16/2015 1243   PROTEINUR 30* 02/02/2015 1030   UROBILINOGEN 0.2 02/16/2015 1243   UROBILINOGEN 0.2 02/02/2015 1030   NITRITE Negative 02/16/2015 1243   NITRITE NEGATIVE 02/02/2015 1030   LEUKOCYTESUR Moderate 02/16/2015 1243   LEUKOCYTESUR SMALL* 02/02/2015 1030    STUDIES: No results found.   Assessment: 45 y.o. BRCA-1 positive [place] woman  1. S/p left breast upper outer quadrant biopsy of two separate breast masses and one axillary lymph node 01/29/2014 for a , clinical mT2 N1 stage IIB, invasive ductal carcinoma, grade 2-3,  estrogen and progesterone receptor negative, with an MIB-1 between 79-100%, and HER 2 amplified  2. completed 6 cycles of carboplatin, docetaxel, trastuzumab and pertuzumab 06/30/2014 with MRI 07/07/2014 showing a complete radiologic response  3. trastuzumab was to be continued to complete a year (through March 2016); however echocardiogram 07/05/2014 showed an ejection fraction of 45-50%-- trastuzumab was held after 06/30/2014 dose until EF recovery, resumed 11/09/2014  (a) cath report from 09/13/2014 shows normal coronaries and a normal left ventricular function with an ejection fraction of 55%.  (b) echo 03/01/2015 suggest continuing mild cardiomyopathy: Trastuzumab discontinued--final dose 02/22/2015  4. 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   5. MRSA port infection and septicemia mid-June 2015;  Port was removed, and  PICC line placed, completed antibiotic therapy with ANCEF; PICC subsequently pulled   6 genetic testing with the [facility] panel and was found to have a pathogenic mutation in the BRCA1 gene called c.190T>G (p.Cys64Gly  7. status post bilateral mastectomies and left axillary sentinel lymph node biopsy, withimmediate  expander placement, on 07/28/14; the pathology ([id number]) showed a complete pathologic response in the left breast and the 3 sentinel lymph nodes sampled; the right breast was benign  (a) bilateral breast reconstruction revision 03/31/2015  8. Bilateral salpingo-oophorectomy and hysterectomy 02/08/15 with benign pathology  PLAN:  Harleigh is doing well overall today. The labs were reviewed in detail and were stable. She has no pressing breast related issues at this time. She plans to follow up with Dr. Migdalia Dk for a mild revision to her bilateral implants. She should have a breast MRI this fall due to her BRCA mutation.   I wish her the best of luck in finding a new job that is less physically demanding. She is worried about reinjuring her left shoulder.   Marleen was explained that we anticipate follow up visits every 3 months. She has interested in working with our survivorship NP, as she had a pleasant experience in that clinic last year. Keeping this in mind, she will see Chestine Spore, NP in July and Dr. Jana Hakim in November. She understands and agrees with this plan. She has been encouraged to call with any issues that might arise before her next visit here.    Laurie Panda, NP 03/13/2016 9:46 AM

## 2016-03-13 NOTE — Telephone Encounter (Signed)
appt made and avs printed °

## 2016-03-16 ENCOUNTER — Encounter: Payer: Self-pay | Admitting: Oncology

## 2016-03-16 NOTE — Progress Notes (Signed)
left at front desk. left for dr. Jana Hakim to sign-faxed to [id number]- mailed copy to patient for his records

## 2016-04-23 ENCOUNTER — Emergency Department (HOSPITAL_COMMUNITY)
Admission: EM | Admit: 2016-04-23 | Discharge: 2016-04-23 | Disposition: A | Discharge status: Home / Self Care | Payer: Medicaid Other | Attending: Emergency Medicine | Admitting: Emergency Medicine

## 2016-04-23 ENCOUNTER — Emergency Department (HOSPITAL_COMMUNITY): Payer: Medicaid Other

## 2016-04-23 ENCOUNTER — Encounter (HOSPITAL_COMMUNITY): Payer: Self-pay | Admitting: Emergency Medicine

## 2016-04-23 DIAGNOSIS — Z791 Long term (current) use of non-steroidal anti-inflammatories (NSAID): Secondary | ICD-10-CM | POA: Insufficient documentation

## 2016-04-23 DIAGNOSIS — J9801 Acute bronchospasm: Secondary | ICD-10-CM

## 2016-04-23 DIAGNOSIS — Z79891 Long term (current) use of opiate analgesic: Secondary | ICD-10-CM | POA: Insufficient documentation

## 2016-04-23 DIAGNOSIS — Z87891 Personal history of nicotine dependence: Secondary | ICD-10-CM | POA: Insufficient documentation

## 2016-04-23 DIAGNOSIS — I10 Essential (primary) hypertension: Secondary | ICD-10-CM | POA: Insufficient documentation

## 2016-04-23 DIAGNOSIS — R0789 Other chest pain: Secondary | ICD-10-CM | POA: Diagnosis present

## 2016-04-23 DIAGNOSIS — Z950 Presence of cardiac pacemaker: Secondary | ICD-10-CM | POA: Insufficient documentation

## 2016-04-23 DIAGNOSIS — Z853 Personal history of malignant neoplasm of breast: Secondary | ICD-10-CM | POA: Insufficient documentation

## 2016-04-23 LAB — BASIC METABOLIC PANEL
ANION GAP: 7 (ref 5–15)
BUN: 16 mg/dL (ref 6–20)
CO2: 26 mmol/L (ref 22–32)
Calcium: 9.4 mg/dL (ref 8.9–10.3)
Chloride: 107 mmol/L (ref 101–111)
Creatinine, Ser: 0.9 mg/dL (ref 0.44–1.00)
GLUCOSE: 100 mg/dL — AB (ref 65–99)
POTASSIUM: 3.4 mmol/L — AB (ref 3.5–5.1)
Sodium: 140 mmol/L (ref 135–145)

## 2016-04-23 LAB — CBC
HEMATOCRIT: 38.1 % (ref 36.0–46.0)
HEMOGLOBIN: 12.4 g/dL (ref 12.0–15.0)
MCH: 28.8 pg (ref 26.0–34.0)
MCHC: 32.5 g/dL (ref 30.0–36.0)
MCV: 88.6 fL (ref 78.0–100.0)
Platelets: 329 10*3/uL (ref 150–400)
RBC: 4.3 MIL/uL (ref 3.87–5.11)
RDW: 12.6 % (ref 11.5–15.5)
WBC: 9.2 10*3/uL (ref 4.0–10.5)

## 2016-04-23 LAB — I-STAT TROPONIN, ED: TROPONIN I, POC: 0 ng/mL (ref 0.00–0.08)

## 2016-04-23 MED ORDER — OMEPRAZOLE 20 MG PO CPDR: 20.0000 mg | DELAYED_RELEASE_CAPSULE | Freq: Every day | ORAL | Status: DC

## 2016-04-23 MED ORDER — NAPROXEN 500 MG PO TABS: 500.0000 mg | ORAL_TABLET | Freq: Two times a day (BID) | ORAL | Status: DC

## 2016-04-23 MED ORDER — PANTOPRAZOLE SODIUM 40 MG PO TBEC: 40.0000 mg | DELAYED_RELEASE_TABLET | Freq: Every day | ORAL | Status: DC | PRN

## 2016-04-23 NOTE — Discharge Instructions (Signed)

## 2016-04-23 NOTE — ED Notes (Signed)
Patient states she has central chest pain that started 30-40 minutes ago with shortness of breath and nausea. Denies emesis.

## 2016-04-23 NOTE — ED Provider Notes (Signed)
CSN: [id number]     Arrival date & time 04/23/16  1313 History   First MD Initiated Contact with Patient 04/23/16 1423     Chief Complaint  Patient presents with  . Chest Pain    HPI Pt was working today.  She finished with a client and started having sharp pain in the front of her chest.  It radiated to the back.   The pain is still there.  Lasted for the last couple of hours.  Deep breathing increases the pain.  No fevers.  No cough.  No leg swelling.  Hx of breast CA.  No active  Treatments.  She has history of DVT associated with an IV catheter.  Pt has had this pain before.  They told her it was related to scar tissue from her double mastectomy.   Past Medical History  Diagnosis Date  . Endometriosis   . Hypertension   . Anemia   . Wears glasses   . Iron deficiency anemia, unspecified 02/25/2014  . BRCA1 positive     c.190T>G (p.Cys64Gly) @ [facility]   . History of blood transfusion     last 9'15. due to chemotherapy.  Marland Kitchen DVT (deep venous thrombosis) (HCC)     Right Subclavian and IJ.. Lovenox stopped 01-10-15 until after surgery planned 02-08-15.  . Fibroid tumor     02-02-15 remains with abdominal pain and some vaginal bleeding due to fibroids.  . 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  . Shortness of breath      when Hemoglobin low, now resolved after transfusions 9'15.  . Rash 02/22/2015  . PONV (postoperative nausea and vomiting)     needs scop patch   Past Surgical History  Procedure Laterality Date  . Cervical polypectomy  2010  . Cesarean section      one previous  . Tubal ligation    . Portacath placement Right 02/23/2014    Procedure: INSERTION PORT-A-CATH;  Surgeon: Joyice Faster. Cornett, MD;  Location: [place];  Service: General;  Laterality: Right;  . Port-a-cath removal N/A 05/21/2014    Procedure: REMOVAL PORT-A-CATH;  Surgeon: Zenovia Jarred, MD;  Location: [place];  Service:  General;  Laterality: N/A;  . Tee without cardioversion N/A 05/26/2014    Procedure: TRANSESOPHAGEAL ECHOCARDIOGRAM (TEE);  Surgeon: Larey Dresser, MD;  Location: [facility] ENDOSCOPY;  Service: Cardiovascular;  Laterality: N/A;  . Bilateral total mastectomy with axillary lymph node dissection Bilateral 07/28/2014    Procedure: BILATERAL TOTAL MASTECTOMY WITH LEFT AXILLARY SENTINEL LYMPH NODE BIOPSY;  Surgeon: Stark Klein, MD;  Location: [place];  Service: General;  Laterality: Bilateral;  . Breast reconstruction with placement of tissue expander and flex hd (acellular hydrated dermis) Bilateral 07/28/2014    Procedure: BILATERAL BREAST RECONSTRUCTION WITH PLACEMENT OF TISSUE EXPANDER AND FLEX HD (ACELLULAR HYDRATED DERMIS);  Surgeon: Theodoro Kos, DO;  Location: [place];  Service: Plastics;  Laterality: Bilateral;  . Removal of tissue expander and placement of implant Bilateral 10/14/2014    Procedure: REMOVAL OF BILATERAL BREAST  TISSUE EXPANDER  WITH PLASCEMENT OF BILATERAL  BREAST IMPLANTS;  Surgeon: Theodoro Kos, DO;  Location: [place];  Service: Plastics;  Laterality: Bilateral;  . Left heart catheterization with coronary angiogram N/A 09/13/2014    Procedure: LEFT HEART CATHETERIZATION WITH CORONARY ANGIOGRAM;  Surgeon: Jolaine Artist, MD;  Location: [facility] CATH LAB;  Service: Cardiovascular;  Laterality: N/A;  . Cardiac  catheterization      10'15- Dr. Sung Amabile  . Robotic assisted total hysterectomy with bilateral salpingo oopherectomy Bilateral 02/08/2015    Procedure: ROBOTIC ASSISTED TOTAL HYSTERECTOMY WITH BILATERAL SALPINGO OOPHORECTOMY; UTERUS WEIGHING GREATER THAN 250 GRAMS;  Surgeon: Everitt Amber, MD;  Location: [facility];  Service: Gynecology;  Laterality: Bilateral;  BRCA 1 GENE MUTATION  . Breast reconstruction Bilateral 03/31/2015    Procedure: BILATERAL BREAST RECONSTRUCTION REVISION WITH BILATERAL LIPOFILLING;  Surgeon: Theodoro Kos, DO;  Location: [place];   Service: Plastics;  Laterality: Bilateral;  . Liposuction with lipofilling Bilateral 03/31/2015    Procedure: LIPOSUCTION WITH LIPOFILLING;  Surgeon: Theodoro Kos, DO;  Location: [place];  Service: Plastics;  Laterality: Bilateral;   Family History  Problem Relation Age of Onset  . Breast cancer Mother 4    currently 65  . Diabetes Father   . Pancreatic cancer Father 21  . Breast cancer Paternal Aunt 28    currently 29; BRCA1 positive  . Stroke Maternal Grandfather   . Cancer Paternal Aunt     unk. primary; deceased 76s  . Breast cancer Cousin     daughter of unaffected paternal aunt; dx in her 59s   Social History  Substance Use Topics  . Smoking status: Former Smoker -- 0.25 packs/day for 15 years    Quit date: 02/18/2009  . Smokeless tobacco: Former Systems developer  . Alcohol Use: No     Comment: occasional   OB History    Gravida Para Term Preterm AB TAB SAB Ectopic Multiple Living   5 5 5       5      Review of Systems  Gastrointestinal:       Some acid taste in her mouth  All other systems reviewed and are negative.     Allergies  Compazine  Home Medications   Prior to Admission medications   Medication Sig Start Date End Date Taking? Authorizing Provider  Biotin 5000 MCG TABS Take 10,000 mcg by mouth daily.   Yes Historical Provider, MD  carvedilol (COREG) 12.5 MG tablet Take 1.5 tablets (18.75 mg total) by mouth 2 (two) times daily with a meal. 01/05/16  Yes Olugbemiga E Doreene Burke, MD  ferrous sulfate 325 (65 FE) MG tablet Take 1 tablet (325 mg total) by mouth 3 (three) times daily with meals. 04/11/15  Yes Tresa Garter, MD  gabapentin (NEURONTIN) 100 MG capsule Take 1 capsule (100 mg total) by mouth 3 (three) times daily. 01/05/16  Yes Tresa Garter, MD  hydrALAZINE (APRESOLINE) 50 MG tablet Take 1 tablet (50 mg total) by mouth every 8 (eight) hours. 03/01/15  Yes Jolaine Artist, MD  hydrochlorothiazide (MICROZIDE) 12.5 MG capsule Take 1  capsule (12.5 mg total) by mouth daily. 01/05/16  Yes Olugbemiga Essie Christine, MD  LORazepam (ATIVAN) 0.5 MG tablet Take 0.5 mg by mouth every 8 (eight) hours as needed for anxiety or sleep. Reported on 03/13/2016   Yes Historical Provider, MD  pantoprazole (PROTONIX) 40 MG tablet Take 1 tablet (40 mg total) by mouth daily. Patient taking differently: Take 40 mg by mouth daily as needed (for acid reflex).  01/05/16  Yes Tresa Garter, MD  acetaminophen-codeine (TYLENOL #3) 300-30 MG tablet Take 1 tablet by mouth every 4 (four) hours as needed. Patient not taking: Reported on 04/23/2016 02/06/16   Tresa Garter, MD  cyclobenzaprine (FLEXERIL) 10 MG tablet Take 1 tablet (10 mg total) by mouth 3 (three) times daily as  needed for muscle spasms. Patient not taking: Reported on 03/13/2016 02/06/16   Tresa Garter, MD  diphenhydrAMINE (BENADRYL) 25 MG tablet Take 1 tablet (25 mg total) by mouth every 6 (six) hours. Patient not taking: Reported on 01/05/2016 07/10/15   Delsa Grana, PA-C  guaiFENesin-dextromethorphan (ROBITUSSIN DM) 100-10 MG/5ML syrup Take 5 mLs by mouth every 4 (four) hours as needed for cough. Patient not taking: Reported on 02/06/2016 01/05/16   Tresa Garter, MD  ibuprofen (ADVIL,MOTRIN) 800 MG tablet Take 1 tablet (800 mg total) by mouth 3 (three) times daily. Patient not taking: Reported on 03/13/2016 02/08/16   [name], PA-C  lisinopril (PRINIVIL,ZESTRIL) 40 MG tablet Take 1 tablet (40 mg total) by mouth daily. Patient not taking: Reported on 03/13/2016 01/05/16   Tresa Garter, MD  naproxen (NAPROSYN) 500 MG tablet Take 1 tablet (500 mg total) by mouth 2 (two) times daily. 04/23/16   Dorie Rank, MD  omeprazole (PRILOSEC) 20 MG capsule Take 1 capsule (20 mg total) by mouth daily. 04/23/16   Dorie Rank, MD   BP 110/70 mmHg  Pulse 64  Temp(Src) 98.3 F (36.8 C) (Oral)  Resp 13  Ht 5' 5"  (1.651 m)  Wt 95.709 kg  BMI 35.11 kg/m2  SpO2 95%  LMP 02/19/2014 Physical  Exam  Constitutional: She appears well-developed and well-nourished. No distress.  HENT:  Head: Normocephalic and atraumatic.  Right Ear: External ear normal.  Left Ear: External ear normal.  Eyes: Conjunctivae are normal. Right eye exhibits no discharge. Left eye exhibits no discharge. No scleral icterus.  Neck: Neck supple. No tracheal deviation present.  Cardiovascular: Normal rate, regular rhythm and intact distal pulses.   Pulmonary/Chest: Effort normal and breath sounds normal. No stridor. No respiratory distress. She has no wheezes. She has no rales. She exhibits tenderness.  Abdominal: Soft. Bowel sounds are normal. She exhibits no distension. There is no tenderness. There is no rebound and no guarding.  Musculoskeletal: She exhibits no edema or tenderness.  Neurological: She is alert. She has normal strength. No cranial nerve deficit (no facial droop, extraocular movements intact, no slurred speech) or sensory deficit. She exhibits normal muscle tone. She displays no seizure activity. Coordination normal.  Skin: Skin is warm and dry. No rash noted.  Psychiatric: She has a normal mood and affect.  Nursing note and vitals reviewed.   ED Course  Procedures (including critical care time) Labs Review Labs Reviewed  BASIC METABOLIC PANEL - Abnormal; Notable for the following:    Potassium 3.4 (*)    Glucose, Bld 100 (*)    All other components within normal limits  CBC  I-STAT TROPOININ, ED    Imaging Review Dg Chest 2 View  04/23/2016  CLINICAL DATA:  Chest pain, shortness of breath. EXAM: CHEST  2 VIEW COMPARISON:  August 26, 2015. FINDINGS: The heart size and mediastinal contours are within normal limits. Both lungs are clear. No pneumothorax or pleural effusion is noted. The visualized skeletal structures are unremarkable. IMPRESSION: No active cardiopulmonary disease. Electronically Signed   By: Marijo Conception, M.D.   On: 04/23/2016 13:50   I have personally reviewed and  evaluated these images and lab results as part of my medical decision-making.   EKG Interpretation   Date/Time:  Monday Apr 23 2016 13:22:16 EDT Ventricular Rate:  78 PR Interval:  136 QRS Duration: 105 QT Interval:  390 QTC Calculation: 444 R Axis:   -25 Text Interpretation:  Sinus rhythm Borderline left  axis deviation  Borderline T wave abnormalities No significant change since last tracing  Confirmed by Quaniya Damas  MD-J, Arabell Neria ([id number]) on 04/23/2016 1:26:11 PM      MDM   Final diagnoses:  Chest wall pain    Patient has reproducible chest wall tenderness. Pain is sharp and atypical for cardiac etiology. Patient has history of prior DVT however that was related to an intravascular device. She is not tachycardic not typical short of breath. Doubt that her symptoms are related to recurrent PE.  Patient did mention some acid type sensation in her mouth. She could have some gastroesophageal reflux but I doubt that is the source of her pain.  Plan on discharge home with prescription for NSAIDs and antacids. She will follow up with her primary care doctor. Return to the ER as needed for worsening symptoms.    Dorie Rank, MD 04/24/16 [phone]

## 2016-05-03 ENCOUNTER — Ambulatory Visit: Payer: Medicaid Other | Admitting: Internal Medicine

## 2016-05-24 ENCOUNTER — Ambulatory Visit: Payer: Medicaid Other | Admitting: Internal Medicine

## 2016-06-06 ENCOUNTER — Ambulatory Visit: Payer: Medicaid Other | Admitting: Internal Medicine

## 2016-06-11 ENCOUNTER — Other Ambulatory Visit: Payer: Self-pay | Admitting: Nurse Practitioner

## 2016-06-11 DIAGNOSIS — C50412 Malignant neoplasm of upper-outer quadrant of left female breast: Secondary | ICD-10-CM

## 2016-06-12 ENCOUNTER — Other Ambulatory Visit (HOSPITAL_BASED_OUTPATIENT_CLINIC_OR_DEPARTMENT_OTHER): Payer: Medicaid Other

## 2016-06-12 ENCOUNTER — Ambulatory Visit (HOSPITAL_BASED_OUTPATIENT_CLINIC_OR_DEPARTMENT_OTHER): Payer: Medicaid Other | Admitting: Nurse Practitioner

## 2016-06-12 ENCOUNTER — Telehealth: Payer: Self-pay | Admitting: Nurse Practitioner

## 2016-06-12 ENCOUNTER — Encounter: Payer: Self-pay | Admitting: Nurse Practitioner

## 2016-06-12 VITALS — BP 132/79 | HR 64 | Temp 98.1°F | Resp 18 | Ht 65.0 in | Wt 207.6 lb

## 2016-06-12 DIAGNOSIS — Z1509 Genetic susceptibility to other malignant neoplasm: Secondary | ICD-10-CM

## 2016-06-12 DIAGNOSIS — C50412 Malignant neoplasm of upper-outer quadrant of left female breast: Secondary | ICD-10-CM

## 2016-06-12 DIAGNOSIS — Z1501 Genetic susceptibility to malignant neoplasm of breast: Secondary | ICD-10-CM

## 2016-06-12 LAB — COMPREHENSIVE METABOLIC PANEL
ALBUMIN: 4 g/dL (ref 3.5–5.0)
ALK PHOS: 94 U/L (ref 40–150)
ALT: 16 U/L (ref 0–55)
ANION GAP: 10 meq/L (ref 3–11)
AST: 15 U/L (ref 5–34)
BILIRUBIN TOTAL: 0.39 mg/dL (ref 0.20–1.20)
BUN: 14.1 mg/dL (ref 7.0–26.0)
CALCIUM: 9.7 mg/dL (ref 8.4–10.4)
CO2: 25 mEq/L (ref 22–29)
CREATININE: 0.9 mg/dL (ref 0.6–1.1)
Chloride: 108 mEq/L (ref 98–109)
EGFR: 90 mL/min/{1.73_m2} — ABNORMAL LOW (ref >90)
Glucose: 90 mg/dl (ref 70–140)
Potassium: 3.8 mEq/L (ref 3.5–5.1)
Sodium: 143 mEq/L (ref 136–145)
TOTAL PROTEIN: 7.1 g/dL (ref 6.4–8.3)

## 2016-06-12 LAB — CBC WITH DIFFERENTIAL/PLATELET
BASO%: 0.1 % (ref 0.0–2.0)
Basophils Absolute: 0 10*3/uL (ref 0.0–0.1)
EOS%: 1.7 % (ref 0.0–7.0)
Eosinophils Absolute: 0.1 10*3/uL (ref 0.0–0.5)
HEMATOCRIT: 38.3 % (ref 34.8–46.6)
HEMOGLOBIN: 12.6 g/dL (ref 11.6–15.9)
LYMPH#: 2.2 10*3/uL (ref 0.9–3.3)
LYMPH%: 31.4 % (ref 14.0–49.7)
MCH: 28.8 pg (ref 25.1–34.0)
MCHC: 32.9 g/dL (ref 31.5–36.0)
MCV: 87.4 fL (ref 79.5–101.0)
MONO#: 0.4 10*3/uL (ref 0.1–0.9)
MONO%: 5.7 % (ref 0.0–14.0)
NEUT#: 4.3 10*3/uL (ref 1.5–6.5)
NEUT%: 61.1 % (ref 38.4–76.8)
PLATELETS: 293 10*3/uL (ref 145–400)
RBC: 4.38 10*6/uL (ref 3.70–5.45)
RDW: 12.5 % (ref 11.2–14.5)
WBC: 7 10*3/uL (ref 3.9–10.3)

## 2016-06-12 NOTE — Progress Notes (Signed)
CLINIC:  Cancer Survivorship   REASON FOR VISIT:  Routine follow-up post-treatment for history of breast cancer.  BRIEF ONCOLOGIC HISTORY:    Breast cancer of upper-outer quadrant of left female breast ([place])   01/08/2014 Mammogram Left breast: 3 cm breast mass, 7 cm from the nipple   01/08/2014 Breast US Left breast: irregularly marginated hypoechoic mass with increased vascularity located at 2 o'clock position, 7 cm from the nipple corresponding to the palpable and mammographic finding. This measures 3.2 x 2.6 x 2.0 cm   01/29/2014 Initial Biopsy Left breast core needle bx: 2:00 lesion, 7 CFN: IDC, DCIS, +LVI; 2:00, 9CFM: IDC, DCIS.  Left axillary LN: positive for malignancy.  ER- (0%), PR- (0%), HER2/neu amplified (ratio 5.1), Ki67 79-100%.   02/07/2014 Breast MRI Two enhancing masses in the upper-outer quadrant of the left breast with enlarged axillary adenopathy corresponding with the patient's known areas of invasive ductal carcinoma and DCIS as well as axillary metastasis.    02/07/2014 Clinical Stage Stage IIB: T2 N1   02/22/2014 Procedure [facility] panel and was found to have a pathogenic mutation in the BRCA1 gene called c.190T>G (p.Cys64Gly   02/22/2014 Procedure Right breast core needle bx: negative for malignancy   02/25/2014 - 06/30/2014 Neo-Adjuvant Chemotherapy Docetaxel, carboplatin, trastuzumab, and pertuzumab (3/26-7/29/15) followed by maintenance trastuzumab   06/30/2014 -  Chemotherapy Maintenace trastuzumab.  Held 07/2014 due to decreased EF=  resumed 11/09/2014; continued decline; d/c'd 02/22/15; resumed 08/04/15 after discussion with Dr. Haroldine Laws   07/07/2014 Breast MRI no remaining abnormal enhancement or mass seen the left breast. No suspicious findings on either side   07/28/2014 Definitive Surgery Bilateral mastectomy/SLNB Barry Dienes) with implant placement: no evidence of residual malignancy in left breast; negative in right breast. 3 Left axillary LN removed and no evidence of  malignancy (0/3). two benign LN from right.   07/28/2014 Pathologic Stage ypT0 ypN0   10/14/2014 Surgery Removal of bilateral breast tissue expander with placement of bilateral breast implants ([place])    02/08/2015 Surgery Bilateral salpingo-oophorectomy and hysterectomy with benign pathology   03/31/2015 Surgery Breast reconstruction revision with bilateral lipofilling ([facility])    INTERVAL HISTORY:  Tamara [name] presents to the [facility] today for ongoing follow up regarding her history of breast cancer. Overall, Tamara [name] reports feeling doing well since her last visit with Tamara Fitz, NP in April 2017.  She was seen in teh ED in May for chest wall pain, which she states has completely subsided.  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  She does still have some dimpling along her right reconstructed breast that she had hoped to see Dr. Migdalia Dk for lipofilling, however, her insurance changed and she is unable to see her.  She is considering paying the cost (~$2500) out of pocket.  She denies any mass or lesion in either breast.  She denies any headache, cough, shortness of breath, or bone pain. She reports a good appetite and has made some changes in her diet (decreasing carb intake, increasing fruits and vegetables) and has lost some weight.  She says overall, she feels much better. She also reports that she is only intermittently taking her carvediol and antihypertensives.  She denies fatigue, chest pain, peripheral edema or headache.  She takes her gabapentin intermittently, as well, when she has pain.  REVIEW OF SYSTEMS:  General: Occasional night  sweats.  Denies fever, chills, unintentional weight loss, or generalized fatigue.  HEENT: Wears glasses.  Denies visual changes, hearing loss, mouth sores, or difficulty swallowing. Cardiac: As  above. Respiratory: As above. Breast: As above.Marland Kitchen  GI: Denies abdominal pain, constipation, diarrhea, nausea, or vomiting.  GU: Denies dysuria, hematuria, vaginal bleeding, vaginal discharge, or vaginal dryness.  Musculoskeletal: As above. Neuro: Denies recent fall or numbness / tingling in her extremities.  Skin: Denies rash, pruritis, or open wounds.  Psych: Denies depression, anxiety, insomnia, or memory loss.   A 14-point review of systems was completed and was negative, except as noted above.   ONCOLOGY TREATMENT TEAM:  1. Surgeon:  Dr. Barry [facility]ienes at [facility]  2. Medical Oncologist: Dr. Jana Hakim 3. Plastic surgeon: Dr. Merri Ray    PAST MEDICAL/SURGICAL HISTORY:  Past Medical History  Diagnosis Date  . Endometriosis   . Hypertension   . Anemia   . Wears glasses   . Iron deficiency anemia, unspecified 02/25/2014  . BRCA1 positive     c.190T>G (p.Cys64Gly) @ [facility]   . History of blood transfusion     last 9'15. due to chemotherapy.  Marland Kitchen DVT (deep venous thrombosis) (HCC)     Right Subclavian and IJ.. Lovenox stopped 01-10-15 until after surgery planned 02-08-15.  . Fibroid tumor     02-02-15 remains with abdominal pain and some vaginal bleeding due to fibroids.  . Breast cancer ([place]) 02/01/14    ER-/PR-/Her2+, still receiving chemo 12/21/14, last Herceptin 02-02-15.Left breat cancer.  . Shortness of breath      when Hemoglobin low, now resolved after transfusions 9'15.  . Rash 02/22/2015  . PONV (postoperative nausea and vomiting)     needs scop patch   Past Surgical History  Procedure Laterality Date  . Cervical polypectomy  2010  . Cesarean section      one previous  . Tubal ligation    . Portacath placement Right 02/23/2014    Procedure: INSERTION PORT-A-CATH;  Surgeon: Joyice Faster. Cornett, MD;  Location: [place];  Service: General;  Laterality: Right;  . Port-a-cath removal N/A 05/21/2014    Procedure: REMOVAL PORT-A-CATH;  Surgeon: Zenovia Jarred, MD;  Location: [place];  Service: General;  Laterality: N/A;  . Tee without cardioversion N/A 05/26/2014    Procedure: TRANSESOPHAGEAL ECHOCARDIOGRAM (TEE);  Surgeon: Larey Dresser, MD;  Location[facility];  Service: Cardiovascular;  Laterality: N/A;  . Bilateral total mastectomy with axillary lymph node dissection Bilateral 07/28/2014    Procedure: BILATERAL TOTAL MASTECTOMY WITH LEFT AXILLARY SENTINEL LYMPH NODE BIOPSY;  Surgeon: Stark Klein, MD;  Location: [place];  Service: General;  Laterality: Bilateral;  . Breast reconstruction with placement of tissue expander and flex hd (acellular hydrated dermis) Bilateral 07/28/2014    Procedure: BILATERAL BREAST RECONSTRUCTION WITH PLACEMENT OF TISSUE EXPANDER AND FLEX HD (ACELLULAR HYDRATED DERMIS);  Surgeon: Theodoro Kos, DO;  Location: [place];  Service: Plastics;  Laterality: Bilateral;  . Removal of tissue expander and placement of implant Bilateral 10/14/2014    Procedure: REMOVAL OF BILATERAL BREAST  TISSUE EXPANDER  WITH PLASCEMENT OF BILATERAL  BREAST IMPLANTS;  Surgeon: Theodoro Kos, DO;  Location: [place];  Service: Plastics;  Laterality: Bilateral;  . Left heart catheterization with coronary angiogram N/A 09/13/2014    Procedure: LEFT HEART CATHETERIZATION WITH CORONARY ANGIOGRAM;  Surgeon: Jolaine Artist, MD;  Location: [facility] CATH LAB;  Service: Cardiovascular;  Laterality: N/A;  . Cardiac catheterization  10'15- Dr. Sung Amabile  . Robotic assisted total hysterectomy with bilateral salpingo oopherectomy Bilateral 02/08/2015    Procedure: ROBOTIC ASSISTED TOTAL HYSTERECTOMY WITH BILATERAL SALPINGO OOPHORECTOMY; UTERUS WEIGHING GREATER THAN 250 GRAMS;  Surgeon: Everitt Amber, MD;  Location: [facility];  Service: Gynecology;  Laterality: Bilateral;  BRCA 1 GENE MUTATION  . Breast reconstruction Bilateral 03/31/2015    Procedure: BILATERAL BREAST RECONSTRUCTION REVISION WITH BILATERAL LIPOFILLING;  Surgeon: Theodoro Kos,  DO;  Location: [place];  Service: Plastics;  Laterality: Bilateral;  . Liposuction with lipofilling Bilateral 03/31/2015    Procedure: LIPOSUCTION WITH LIPOFILLING;  Surgeon: Theodoro Kos, DO;  Location: [place];  Service: Plastics;  Laterality: Bilateral;     ALLERGIES:  Allergies  Allergen Reactions  . Compazine [Prochlorperazine Edisylate] Other (See Comments)    Stuttering     CURRENT MEDICATIONS:  Current Outpatient Prescriptions on File Prior to Visit  Medication Sig Dispense Refill  . acetaminophen-codeine (TYLENOL #3) 300-30 MG tablet Take 1 tablet by mouth every 4 (four) hours as needed. 30 tablet 0  . Biotin 5000 MCG TABS Take 10,000 mcg by mouth daily.    . carvedilol (COREG) 12.5 MG tablet Take 1.5 tablets (18.75 mg total) by mouth 2 (two) times daily with a meal. 90 tablet 3  . cyclobenzaprine (FLEXERIL) 10 MG tablet Take 1 tablet (10 mg total) by mouth 3 (three) times daily as needed for muscle spasms. 30 tablet 0  . diphenhydrAMINE (BENADRYL) 25 MG tablet Take 1 tablet (25 mg total) by mouth every 6 (six) hours. 20 tablet 0  . ferrous sulfate 325 (65 FE) MG tablet Take 1 tablet (325 mg total) by mouth 3 (three) times daily with meals. 270 tablet 3  . gabapentin (NEURONTIN) 100 MG capsule Take 1 capsule (100 mg total) by mouth 3 (three) times daily. 90 capsule 3  . guaiFENesin-dextromethorphan (ROBITUSSIN DM) 100-10 MG/5ML syrup Take 5 mLs by mouth every 4 (four) hours as needed for cough. 480 mL 0  . hydrALAZINE (APRESOLINE) 50 MG tablet Take 1 tablet (50 mg total) by mouth every 8 (eight) hours. 90 tablet 3  . hydrochlorothiazide (MICROZIDE) 12.5 MG capsule Take 1 capsule (12.5 mg total) by mouth daily. 90 capsule 3  . ibuprofen (ADVIL,MOTRIN) 800 MG tablet Take 1 tablet (800 mg total) by mouth 3 (three) times daily. 21 tablet 0  . lisinopril (PRINIVIL,ZESTRIL) 40 MG tablet Take 1 tablet (40 mg total) by mouth daily. 90 tablet 3  .  LORazepam (ATIVAN) 0.5 MG tablet Take 0.5 mg by mouth every 8 (eight) hours as needed for anxiety or sleep. Reported on 03/13/2016    . naproxen (NAPROSYN) 500 MG tablet Take 1 tablet (500 mg total) by mouth 2 (two) times daily. 30 tablet 0  . pantoprazole (PROTONIX) 40 MG tablet Take 1 tablet (40 mg total) by mouth daily as needed (for acid reflex). 30 tablet 3  . [DISCONTINUED] omeprazole (PRILOSEC) 20 MG capsule Take 1 capsule (20 mg total) by mouth daily. 14 capsule 0   No current facility-administered medications on file prior to visit.     ONCOLOGIC FAMILY HISTORY:  Family History  Problem Relation Age of Onset  . Breast cancer Mother 22    currently 34  . Diabetes Father   . Pancreatic cancer Father 55  . Breast cancer Paternal Aunt 67    currently 17; BRCA1 positive  . Stroke Maternal Grandfather   . Cancer Paternal Aunt     unk. primary; deceased  74s  . Breast cancer Cousin     daughter of unaffected paternal aunt; dx in her 22s     GENETIC COUNSELING/TESTING: Yes, performed 02/22/2014: [facility] panel and was found to have a pathogenic mutation in the BRCA1 gene called c.190T>G (p.Cys64Gly  SOCIAL HISTORY:  Velvia Mehrer is divorced and lives with her family in [place], [place].  Although their divorce has been finalized, she is continuing to work on their relationship with hopes for reconciliation.   She has 5 children. 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  She is a former smoker and denies any current or history of illicit drug use.  She uses alcohol on occasion.     PHYSICAL EXAMINATION:  Vital Signs: Filed Vitals:   06/12/16 1007  BP: 132/79  Pulse: 64  Temp: 98.1 F (36.7 C)  Resp: 18   Weight: 207# (deliberate decrease of 7# since last visit with dietary changes) ECOG performance status: 0 General: Well-nourished, well-appearing female in no acute distress.  She is  unaccompanied in clinic today.   HEENT: Head is atraumatic and normocephalic.  Pupils equal and reactive to light and accomodation. Conjunctivae clear without exudate.  Sclerae anicteric. Oral mucosa is pink, moist, and intact without lesions.  Oropharynx is pink without lesions or erythema.  Lymph: No cervical, supraclavicular, infraclavicular, or axillary lymphadenopathy noted on palpation.  Cardiovascular: Regular rate and rhythm without murmurs, rubs, or gallops. Respiratory: Clear to auscultation bilaterally. Chest expansion symmetric without accessory muscle use on inspiration or expiration.  Breast: Bilateral breast exam performed. Bilaterally reconstructed breasts with some mild dimpling along right breast.  No mass or nodularity in either breast.  GI: Abdomen soft and round. No tenderness to palpation. Bowel sounds normoactive in 4 quadrants. No hepatosplenomegaly.   GU: Deferred.  Musculoskeletal: Muscle strength 5/5 in all extremities.   Neuro: No focal deficits. Steady gait.  Psych: Mood and affect normal and appropriate for situation.  Extremities: No edema, cyanosis, or clubbing.  Skin: Warm and dry. No open lesions noted.   LABORATORY DATA:  Recent Results (from the past 2160 hour(s))  Basic metabolic panel     Status: Abnormal   Collection Time: 04/23/16  1:53 PM  Result Value Ref Range   Sodium 140 135 - 145 mmol/L   Potassium 3.4 (L) 3.5 - 5.1 mmol/L   Chloride 107 101 - 111 mmol/L   CO2 26 22 - 32 mmol/L   Glucose, Bld 100 (H) 65 - 99 mg/dL   BUN 16 6 - 20 mg/dL   Creatinine, Ser 0.90 0.44 - 1.00 mg/dL   Calcium 9.4 8.9 - 10.3 mg/dL   GFR calc non Af Amer >60 >60 mL/min   GFR calc Af Amer >60 >60 mL/min    Comment: (NOTE) The eGFR has been calculated using the CKD EPI equation. This calculation has not been validated in all clinical situations. eGFR's persistently <60 mL/min signify possible Chronic Kidney Disease.    Anion gap 7 5 - 15  CBC     Status: None    Collection Time: 04/23/16  1:53 PM  Result Value Ref Range   WBC 9.2 4.0 - 10.5 K/uL   RBC 4.30 3.87 - 5.11 MIL/uL   Hemoglobin 12.4 12.0 - 15.0 g/dL   HCT 38.1 36.0 - 46.0 %   MCV 88.6 78.0 - 100.0 fL   MCH 28.8 26.0 - 34.0 pg  MCHC 32.5 30.0 - 36.0 g/dL   RDW 12.6 11.5 - 15.5 %   Platelets 329 150 - 400 K/uL  I-stat troponin, ED     Status: None   Collection Time: 04/23/16  2:04 PM  Result Value Ref Range   Troponin i, poc 0.00 0.00 - 0.08 ng/mL   Comment 3            Comment: Due to the release kinetics of cTnI, a negative result within the first hours of the onset of symptoms does not rule out myocardial infarction with certainty. If myocardial infarction is still suspected, repeat the test at appropriate intervals.   CBC with Differential     Status: None   Collection Time: 06/12/16  9:53 AM  Result Value Ref Range   WBC 7.0 3.9 - 10.3 10e3/uL   NEUT# 4.3 1.5 - 6.5 10e3/uL   HGB 12.6 11.6 - 15.9 g/dL   HCT 38.3 34.8 - 46.6 %   Platelets 293 145 - 400 10e3/uL   MCV 87.4 79.5 - 101.0 fL   MCH 28.8 25.1 - 34.0 pg   MCHC 32.9 31.5 - 36.0 g/dL   RBC 4.38 3.70 - 5.45 10e6/uL   RDW 12.5 11.2 - 14.5 %   lymph# 2.2 0.9 - 3.3 10e3/uL   MONO# 0.4 0.1 - 0.9 10e3/uL   Eosinophils Absolute 0.1 0.0 - 0.5 10e3/uL   Basophils Absolute 0.0 0.0 - 0.1 10e3/uL   NEUT% 61.1 38.4 - 76.8 %   LYMPH% 31.4 14.0 - 49.7 %   MONO% 5.7 0.0 - 14.0 %   EOS% 1.7 0.0 - 7.0 %   BASO% 0.1 0.0 - 2.0 %  Comprehensive metabolic panel     Status: Abnormal   Collection Time: 06/12/16  9:53 AM  Result Value Ref Range   Sodium 143 136 - 145 mEq/L   Potassium 3.8 3.5 - 5.1 mEq/L   Chloride 108 98 - 109 mEq/L   CO2 25 22 - 29 mEq/L   Glucose 90 70 - 140 mg/dl    Comment: Glucose reference range is for nonfasting patients. Fasting glucose reference range is 70- 100.   BUN 14.1 7.0 - 26.0 mg/dL   Creatinine 0.9 0.6 - 1.1 mg/dL   Total Bilirubin 0.39 0.20 - 1.20 mg/dL   Alkaline Phosphatase 94 40 - 150  U/L   AST 15 5 - 34 U/L   ALT 16 0 - 55 U/L   Total Protein 7.1 6.4 - 8.3 g/dL   Albumin 4.0 3.5 - 5.0 g/dL   Calcium 9.7 8.4 - 10.4 mg/dL   Anion Gap 10 3 - 11 mEq/L   EGFR 90 (L) >90 ml/min/1.73 m2    Comment: eGFR is calculated using the CKD-EPI Creatinine Equation (2009)        ASSESSMENT AND PLAN:   1. 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 Haroldine Laws), now followed in a program of surveillance.  Tamara [name] is doing well with no clinical symptoms worrisome for cancer recurrence at this time. Stable labs.  I have reviewed the recommendations for ongoing surveillance with her and she will follow-up  with her medical oncologist,  Dr. Jana Hakim, in November 2017 with history and physical exam per surveillance protocol.  She will undergo breast MRI in October 2017 prior to her appointment due to her BRCA1 positivity.  She was instructed to make [place] aware if she notes any change within her reconstructed breasts, any new symptoms such as pain, shortness of breath, weight loss, or fatigue.  Regarding her intermittently taking her cardiac medications, I have strongly advised her to reach out to Dr. Clayborne Dana office ASAP to discuss this with him and see him for evaluation prior to her making any dose adjustments on her own. We reviewed the mechanism of action of the medications on her heart function as it relates to the damage she experienced during the trastuzumab. She states that she will call them today.    2. Cancer  screening:  Due to Tamara [name]'s history and her age, she should receive screening for skin cancers, colon cancer (beginning at age 79), and gynecologic cancers.  The information and recommendations were shared with the patient and in her written after visit summary.  3. Health maintenance and wellness promotion:Tamara [name] and I discussed recommendations to maximize nutrition and minimize recurrence, such as increased intake of fruits, vegetables, lean proteins, and minimizing the intake of red meats and processed foods.  She was also encouraged to engage in moderate to vigorous exercise for 30 minutes per day most days of the week.  She was instructed to limit her alcohol consumption and continue to abstain from tobacco use.   4. Support services/counseling:  Tamara [name] was offered support today through active listening and expressive supportive counseling.    A total of 30 minutes of face-to-face time was spent with this patient with greater than 50% of that time in counseling and care-coordination.   Sylvan Cheese, NP  Survivorship Program [facility] [phone]   Note: PRIMARY CARE PROVIDER Angelica Chessman, [place] [phone]

## 2016-06-12 NOTE — Patient Instructions (Addendum)
Thank you for coming in today!  As we discussed, please continue to perform your self breast exam and report any changes. If you note any new symptoms (please see below), be sure to notify us ASAP.  Please call Dr. Edwena Bunde office about the heart medications ASAP.  Very glad that you are feeling stronger and hope that it continues!!  I will enter orders for your breast MRI for October before your appointment with Dr. Jana Hakim in November 2017.  Please be sure to stop by scheduling on your way out to make those appointment(s).  Looking forward to working with you in the future!  Let us know if you have any questions!  Symptoms to Watch for and Report to Your Provider  . Changes along your reconstruction . New or unusual pain that seems unrelated to an injury and does not go away, including back pain or bone pain . Weight loss without trying/intending . Unexplained bleeding . A rash or allergic reaction, such as swelling, severe itching or wheezing . Chills or fevers . Persistent headaches . Shortness of breath or difficulty breathing . Bloody stools or blood in your urine . Nausea, vomiting, diarrhea, loss of appetite, or trouble swallowing . A cough that does not go away . Abdominal pain . Swelling in your arms or legs . Fractures . Hot flashes or other menopausal symptoms . Any other signs mentioned by your doctor or nurse or any unusual symptoms                 that you just can't explain   NOTE: Just because you have certain symptoms, it doesn't mean the cancer has come back or you have a new cancer. Symptoms can be due to other problems that need to be addressed.  It is important to watch for these symptoms and report them to your provider so you can be medically evaluated for any of these concerns!     Living a Life of Wellness After Cancer:  *Note: Please consult your health care provider before using any medications, supplements, over-the-counter products, or other interventions.   Also, please consult your primary care provider before you begin any lifestyle program (diet, exercise, etc.).  Your safety is our top priority and we want to make sure you continue to live a long and healthy life!    Healthy Lifestyle Recommendations  As a cancer survivor, it is important develop a lifelong commitment to a healthy lifestyle. A healthy lifestyle can prevent cancer from returning as well as prevent other diseases like heart disease, diabetes and high blood pressure.  These are some things that you can do to have a healthy lifestyle:  Marland Kitchen Maintain a healthy weight.  . Exercise daily per your doctor's orders. . Eat a balanced diet high in fruits, vegetables, bran, and fiber. Limit intake of red meat      and processed foods.  . Limit how much alcohol you consume, if at all. Ali Lowe regular bone mineral density testing for osteoporosis.  . Talk to your doctor about cardiovascular disease or "heart disease" screening. . Stop smoking (if you smoke). . Know your family history. . Be mindful of your emotional, social, and spiritual needs. . Meet regularly with a Primary Care Provider (PCP). Find a PCP if you do not             already have one. . Talk to your doctor about regular cancer screening including screening for colon  cancer, GYN cancers, and skin cancer.

## 2016-06-12 NOTE — Telephone Encounter (Signed)
appt made and avs printed. Gave pt GI number to call and schedule MRI due to screening questions

## 2016-06-13 ENCOUNTER — Other Ambulatory Visit: Payer: Self-pay

## 2016-06-13 DIAGNOSIS — C50412 Malignant neoplasm of upper-outer quadrant of left female breast: Secondary | ICD-10-CM

## 2016-06-18 ENCOUNTER — Emergency Department (HOSPITAL_COMMUNITY)
Admission: EM | Admit: 2016-06-18 | Discharge: 2016-06-18 | Disposition: A | Discharge status: Home / Self Care | Payer: Medicaid Other | Attending: Emergency Medicine | Admitting: Emergency Medicine

## 2016-06-18 ENCOUNTER — Encounter (HOSPITAL_COMMUNITY): Payer: Self-pay | Admitting: Emergency Medicine

## 2016-06-18 ENCOUNTER — Telehealth (HOSPITAL_COMMUNITY): Payer: Self-pay | Admitting: Vascular Surgery

## 2016-06-18 ENCOUNTER — Telehealth: Payer: Self-pay | Admitting: *Deleted

## 2016-06-18 ENCOUNTER — Emergency Department (HOSPITAL_COMMUNITY): Payer: Medicaid Other

## 2016-06-18 DIAGNOSIS — I10 Essential (primary) hypertension: Secondary | ICD-10-CM | POA: Diagnosis not present

## 2016-06-18 DIAGNOSIS — X58XXXA Exposure to other specified factors, initial encounter: Secondary | ICD-10-CM | POA: Diagnosis not present

## 2016-06-18 DIAGNOSIS — Z87891 Personal history of nicotine dependence: Secondary | ICD-10-CM | POA: Diagnosis not present

## 2016-06-18 DIAGNOSIS — Z853 Personal history of malignant neoplasm of breast: Secondary | ICD-10-CM | POA: Diagnosis not present

## 2016-06-18 DIAGNOSIS — Y939 Activity, unspecified: Secondary | ICD-10-CM | POA: Diagnosis not present

## 2016-06-18 DIAGNOSIS — S8012XA Contusion of left lower leg, initial encounter: Secondary | ICD-10-CM | POA: Diagnosis not present

## 2016-06-18 DIAGNOSIS — Z79899 Other long term (current) drug therapy: Secondary | ICD-10-CM | POA: Diagnosis not present

## 2016-06-18 DIAGNOSIS — Y999 Unspecified external cause status: Secondary | ICD-10-CM | POA: Diagnosis not present

## 2016-06-18 DIAGNOSIS — M25552 Pain in left hip: Secondary | ICD-10-CM | POA: Insufficient documentation

## 2016-06-18 DIAGNOSIS — M25562 Pain in left knee: Secondary | ICD-10-CM

## 2016-06-18 DIAGNOSIS — T148XXA Other injury of unspecified body region, initial encounter: Secondary | ICD-10-CM

## 2016-06-18 DIAGNOSIS — Y9222 Religious institution as the place of occurrence of the external cause: Secondary | ICD-10-CM | POA: Diagnosis not present

## 2016-06-18 DIAGNOSIS — S8992XA Unspecified injury of left lower leg, initial encounter: Secondary | ICD-10-CM | POA: Diagnosis present

## 2016-06-18 NOTE — ED Provider Notes (Signed)
CSN: [id number]     Arrival date & time 06/18/16  1726 History  By signing my name below, I, Tamara [name], attest that this documentation has been prepared under the direction and in the presence of Tamara Coakley L. Bjorn Loser, PA-C Electronically Signed: Soijett [name], ED Scribe. 06/18/2016. 5:51 PM.   Chief Complaint  Patient presents with  . Leg Pain      The history is provided by the patient. No language interpreter was used.    Tamara [name] is a 45 y.o. female with a medical hx of BRCA 1 positive, breast CA, DVT, and HTN, who presents to the Emergency Department complaining of left anterior thigh pain onset yesterday. Pt reports that she bumped a coffee table while at church and noticed immediate swelling. Pt states that she has a PMHx of breast CA and has been in remission x 6 months. Pt last had chemotherapy in 2015 and her last round of herceptin being 02/2015. Pt notes that she has had a double mastectomy, hysterectomy, and lymphnodes removed due to her breast CA. Pt is no longer on levonox, but she was on the blood thinner for 6 months following her DVT dx. Pt notes that she stopped taking her HTN and carvedilol medications 1.5 months ago and her last visit with her oncologist was 6 days ago. Pt is having associated symptoms of bruising to left anterior thigh, right knee pain, and right hip pain. She notes that she has not tried any medications for the relief of her symptoms. She denies wound, rash, leg swelling, numbness, weakness, fever, CP, SOB, and any other symptoms.    Past Medical History  Diagnosis Date  . Endometriosis   . Hypertension   . Anemia   . Wears glasses   . Iron deficiency anemia, unspecified 02/25/2014  . BRCA1 positive     c.190T>G (p.Cys64Gly) @ [facility]   . History of blood transfusion     last 9'15. due to chemotherapy.  Marland Kitchen DVT (deep venous thrombosis) (HCC)     Right Subclavian and IJ.. Lovenox stopped 01-10-15 until after surgery planned 02-08-15.  . Fibroid tumor      02-02-15 remains with abdominal pain and some vaginal bleeding due to fibroids.  . Breast cancer ([place]) 02/01/14    ER-/PR-/Her2+, still receiving chemo 12/21/14, last Herceptin 02-02-15.Left breat cancer.  . Shortness of breath      when Hemoglobin low, now resolved after transfusions 9'15.  . Rash 02/22/2015  . PONV (postoperative nausea and vomiting)     needs scop patch   Past Surgical History  Procedure Laterality Date  . Cervical polypectomy  2010  . Cesarean section      one previous  . Tubal ligation    . Portacath placement Right 02/23/2014    Procedure: INSERTION PORT-A-CATH;  Surgeon: Joyice Faster. Cornett, MD;  Location: [place];  Service: General;  Laterality: Right;  . Port-a-cath removal N/A 05/21/2014    Procedure: REMOVAL PORT-A-CATH;  Surgeon: Zenovia Jarred, MD;  Location: [place];  Service: General;  Laterality: N/A;  . Tee without cardioversion N/A 05/26/2014    Procedure: TRANSESOPHAGEAL ECHOCARDIOGRAM (TEE);  Surgeon: Larey Dresser, MD;  Location: [facility] ENDOSCOPY;  Service: Cardiovascular;  Laterality: N/A;  . Bilateral total mastectomy with axillary lymph node dissection Bilateral 07/28/2014    Procedure: BILATERAL TOTAL MASTECTOMY WITH LEFT AXILLARY SENTINEL LYMPH NODE BIOPSY;  Surgeon: Stark Klein, MD;  Location: [place];  Service: General;  Laterality: Bilateral;  . Breast reconstruction  with placement of tissue expander and flex hd (acellular hydrated dermis) Bilateral 07/28/2014    Procedure: BILATERAL BREAST RECONSTRUCTION WITH PLACEMENT OF TISSUE EXPANDER AND FLEX HD (ACELLULAR HYDRATED DERMIS);  Surgeon: Theodoro Kos, DO;  Location: [place];  Service: Plastics;  Laterality: Bilateral;  . Removal of tissue expander and placement of implant Bilateral 10/14/2014    Procedure: REMOVAL OF BILATERAL BREAST  TISSUE EXPANDER  WITH PLASCEMENT OF BILATERAL  BREAST IMPLANTS;  Surgeon: Theodoro Kos, DO;  Location: [place];  Service: Plastics;  Laterality:  Bilateral;  . Left heart catheterization with coronary angiogram N/A 09/13/2014    Procedure: LEFT HEART CATHETERIZATION WITH CORONARY ANGIOGRAM;  Surgeon: Jolaine Artist, MD;  Location: [facility] CATH LAB;  Service: Cardiovascular;  Laterality: N/A;  . Cardiac catheterization      10'15- Dr. Sung Amabile  . Robotic assisted total hysterectomy with bilateral salpingo oopherectomy Bilateral 02/08/2015    Procedure: ROBOTIC ASSISTED TOTAL HYSTERECTOMY WITH BILATERAL SALPINGO OOPHORECTOMY; UTERUS WEIGHING GREATER THAN 250 GRAMS;  Surgeon: Everitt Amber, MD;  Location: [facility];  Service: Gynecology;  Laterality: Bilateral;  BRCA 1 GENE MUTATION  . Breast reconstruction Bilateral 03/31/2015    Procedure: BILATERAL BREAST RECONSTRUCTION REVISION WITH BILATERAL LIPOFILLING;  Surgeon: Theodoro Kos, DO;  Location: [place];  Service: Plastics;  Laterality: Bilateral;  . Liposuction with lipofilling Bilateral 03/31/2015    Procedure: LIPOSUCTION WITH LIPOFILLING;  Surgeon: Theodoro Kos, DO;  Location: [place];  Service: Plastics;  Laterality: Bilateral;   Family History  Problem Relation Age of Onset  . Breast cancer Mother 17    currently 30  . Diabetes Father   . Pancreatic cancer Father 44  . Breast cancer Paternal Aunt 36    currently 19; BRCA1 positive  . Stroke Maternal Grandfather   . Cancer Paternal Aunt     unk. primary; deceased 63s  . Breast cancer Cousin     daughter of unaffected paternal aunt; dx in her 23s   Social History  Substance Use Topics  . Smoking status: Former Smoker -- 0.25 packs/day for 15 years    Quit date: 02/18/2009  . Smokeless tobacco: Former Systems developer  . Alcohol Use: No     Comment: occasional   OB History    Gravida Para Term Preterm AB TAB SAB Ectopic Multiple Living   5 5 5       5      Review of Systems  Constitutional: Negative for fever.  Respiratory: Negative for shortness of breath.   Cardiovascular: Negative for chest pain  and leg swelling.  Musculoskeletal: Positive for myalgias (left thigh pain). Negative for joint swelling.  Skin: Positive for color change (bruising to left thigh). Negative for wound.  Neurological: Negative for weakness and numbness.      Allergies  Compazine  Home Medications   Prior to Admission medications   Medication Sig Start Date End Date Taking? Authorizing Provider  acetaminophen-codeine (TYLENOL #3) 300-30 MG tablet Take 1 tablet by mouth every 4 (four) hours as needed. 02/06/16   Tresa Garter, MD  Biotin 5000 MCG TABS Take 10,000 mcg by mouth daily.    Historical Provider, MD  carvedilol (COREG) 12.5 MG tablet Take 1.5 tablets (18.75 mg total) by mouth 2 (two) times daily with a meal. 01/05/16   Tresa Garter, MD  cyclobenzaprine (FLEXERIL) 10 MG tablet Take 1 tablet (10 mg total) by mouth 3 (three) times daily as needed for muscle spasms. 02/06/16  Tresa Garter, MD  diphenhydrAMINE (BENADRYL) 25 MG tablet Take 1 tablet (25 mg total) by mouth every 6 (six) hours. 07/10/15   Delsa Grana, PA-C  ferrous sulfate 325 (65 FE) MG tablet Take 1 tablet (325 mg total) by mouth 3 (three) times daily with meals. 04/11/15   Tresa Garter, MD  gabapentin (NEURONTIN) 100 MG capsule Take 1 capsule (100 mg total) by mouth 3 (three) times daily. 01/05/16   Tresa Garter, MD  guaiFENesin-dextromethorphan (ROBITUSSIN DM) 100-10 MG/5ML syrup Take 5 mLs by mouth every 4 (four) hours as needed for cough. 01/05/16   Tresa Garter, MD  hydrALAZINE (APRESOLINE) 50 MG tablet Take 1 tablet (50 mg total) by mouth every 8 (eight) hours. 03/01/15   Jolaine Artist, MD  hydrochlorothiazide (MICROZIDE) 12.5 MG capsule Take 1 capsule (12.5 mg total) by mouth daily. 01/05/16   Tresa Garter, MD  ibuprofen (ADVIL,MOTRIN) 800 MG tablet Take 1 tablet (800 mg total) by mouth 3 (three) times daily. 02/08/16   [name], PA-C  lisinopril (PRINIVIL,ZESTRIL) 40 MG tablet Take 1  tablet (40 mg total) by mouth daily. 01/05/16   Tresa Garter, MD  LORazepam (ATIVAN) 0.5 MG tablet Take 0.5 mg by mouth every 8 (eight) hours as needed for anxiety or sleep. Reported on 03/13/2016    Historical Provider, MD  naproxen (NAPROSYN) 500 MG tablet Take 1 tablet (500 mg total) by mouth 2 (two) times daily. 04/23/16   Dorie Rank, MD  pantoprazole (PROTONIX) 40 MG tablet Take 1 tablet (40 mg total) by mouth daily as needed (for acid reflex). 04/23/16   Dorie Rank, MD   BP 152/86 mmHg  Pulse 78  Temp(Src) 97.9 F (36.6 C) (Oral)  Resp 16  Ht 5' 5"  (1.651 m)  Wt 93.895 kg  BMI 34.45 kg/m2  SpO2 100%  LMP 02/19/2014 Physical Exam  Constitutional: She is oriented to person, place, and time. She appears well-developed and well-nourished. No distress.  HENT:  Head: Normocephalic and atraumatic.  Eyes: EOM are normal.  Neck: Neck supple.  Cardiovascular: Normal rate, regular rhythm and normal heart sounds.  Exam reveals no gallop and no friction rub.   No murmur heard. Pulmonary/Chest: Effort normal and breath sounds normal. No respiratory distress. She has no wheezes. She has no rales.  Abdominal: She exhibits no distension.  Musculoskeletal: Normal range of motion.       Left hip: She exhibits tenderness.       Left knee: Tenderness found.       Left upper leg: She exhibits tenderness and swelling.       Right lower leg: Normal.       Left lower leg: Normal.  4 cm contusion on left lateral proximal thigh with mild localized swelling and TTP. No appreciable warmth or redness. Tenderness to left greater trochanter and left lateral knee at joint line. No LE swelling noted. No tenderness to posterior calf. No palpable cords. Negative homan's. Intact distal pulses. Sensation and strength intact. Pt able to ambulate without assistance.   Neurological: She is alert and oriented to person, place, and time.  Skin: Skin is warm and dry. Bruising ( left lateral proximal thigh) noted.   Psychiatric: Her behavior is normal. Her mood appears anxious.  Nursing note and vitals reviewed.   ED Course  Procedures (including critical care time) DIAGNOSTIC STUDIES: Oxygen Saturation is 100% on RA, nl by my interpretation.    COORDINATION OF CARE: 6:41 PM Discussed  treatment plan with pt at bedside which includes x-rays and pt agreed to plan.    Imaging Review Dg Knee Complete 4 Views Left  06/18/2016  CLINICAL DATA:  Trauma the left hip with pain to hip and knee. Remote, left knee trauma. EXAM: LEFT KNEE - COMPLETE 4+ VIEW COMPARISON:  None. FINDINGS: No fracture, dislocation, or joint effusion. IMPRESSION: Negative. Electronically Signed   By: Dorise Bullion III M.D   On: 06/18/2016 19:19   Dg Hip Unilat With Pelvis 2-3 Views Left  06/18/2016  CLINICAL DATA:  Pain after trauma EXAM: DG HIP (WITH OR WITHOUT PELVIS) 2-3V LEFT COMPARISON:  None. FINDINGS: There is no evidence of hip fracture or dislocation. There is no evidence of arthropathy or other focal bone abnormality. IMPRESSION: Negative. Electronically Signed   By: Dorise Bullion III M.D   On: 06/18/2016 19:16   I have personally reviewed and evaluated these images as part of my medical decision-making.    MDM   Final diagnoses:  Bruise  Hip pain, left  Left knee pain    Patient is afebrile and non-toxic appearing. Patient is anxious appearing regarding the bruising and her history of cancer. Her vital signs are stable. Physical exam remarkable for TTP of left greater trochanter and left lateral knee at joint line. A 4cm contusion is noted on left lateral proximal thigh. X-rays negative for acute abnormality. She is not currently on anticoagulation. Recent blood work completed on 06/12/16 shows normal WBC, RBC, and platelets, low suspicion for increased bleeding risk. Suspect contusion, pain, and swelling are secondary to localized trauma. Reassured patient. Discussed symptomatic management. Knee brace given for  knee pain. Encouraged follow up with oncologist. Also emphasized importance of following up with PCP regarding her home medications for managing her chronic medical conditions. Discussed return precautions. Pt voiced understanding and is agreeable.   I personally performed the services described in this documentation, which was scribed in my presence. The recorded information has been reviewed and is accurate.    Roxanna Mew, PA-C 06/19/16 1807   Blanchie Dessert, MD 06/24/16 2130

## 2016-06-18 NOTE — Telephone Encounter (Addendum)
Pt came by, she want to let you know she stopped taking her carvedilol for about a month and a half please advise

## 2016-06-18 NOTE — Progress Notes (Deleted)
Pt c/o mid back pain which she has had times two days. Pain is now a 9/10. Pt c/o tingling down the left leg. No incontinence of urine.No c/o weakness in left leg. Positive pedal pulse.

## 2016-06-18 NOTE — ED Notes (Signed)
Patient ran into coffee table and bruised left leg. Patient is having pain in the left leg. Patient has a history of cancer.

## 2016-06-18 NOTE — Discharge Instructions (Signed)
Read the information below.   Your x-rays were re-assuring. You were given a knee sleeve for your knee pain. You can take tylenol or motrin for pain relief. You can ice the affected area and try elevating.  Use the prescribed medication as directed.  Please discuss all new medications with your pharmacist.   Be sure to follow up with your primary care provider for re-evaluation or if your symptoms do not improve.  You may return to the Emergency Department at any time for worsening condition or any new symptoms that concern you. Return to ED if your symptoms worsen or you develop a fever, chest pain, shortness of breath, unilateral leg swelling/pain, warmth/redness/swelling or site, worsening bruising, or easy bleeding.

## 2016-06-18 NOTE — Progress Notes (Signed)
Sleeve placed on left knee. Pt states it feels better. Ice pack given to pt too.

## 2016-06-18 NOTE — Telephone Encounter (Signed)
"  I'm calling because I can't reach my PCP.  I hit my knee and have a bruised knee.  It's swollen and visible through my pants.  My knee buckles when I walk at times." Advised [facility] or [facility].

## 2016-06-19 ENCOUNTER — Ambulatory Visit: Payer: Self-pay

## 2016-06-21 ENCOUNTER — Ambulatory Visit: Payer: Medicaid Other | Attending: Internal Medicine | Admitting: Internal Medicine

## 2016-06-21 ENCOUNTER — Encounter: Payer: Self-pay | Admitting: Internal Medicine

## 2016-06-21 VITALS — BP 126/84 | HR 67 | Temp 97.8°F | Wt 210.6 lb

## 2016-06-21 DIAGNOSIS — I10 Essential (primary) hypertension: Secondary | ICD-10-CM

## 2016-06-21 DIAGNOSIS — M545 Low back pain, unspecified: Secondary | ICD-10-CM | POA: Insufficient documentation

## 2016-06-21 DIAGNOSIS — R350 Frequency of micturition: Secondary | ICD-10-CM | POA: Insufficient documentation

## 2016-06-21 LAB — POCT URINALYSIS DIP (MANUAL ENTRY)
Bilirubin, UA: NEGATIVE
Glucose, UA: NEGATIVE
Ketones, POC UA: NEGATIVE
Leukocytes, UA: NEGATIVE
NITRITE UA: NEGATIVE
PH UA: 5.5
Protein Ur, POC: NEGATIVE
SPEC GRAV UA: 1.02
UROBILINOGEN UA: 0.2

## 2016-06-21 LAB — POCT GLYCOSYLATED HEMOGLOBIN (HGB A1C): HEMOGLOBIN A1C: 5

## 2016-06-21 MED ORDER — FLUCONAZOLE 200 MG PO TABS: 200.0000 mg | ORAL_TABLET | Freq: Once | ORAL | Status: DC

## 2016-06-21 NOTE — Telephone Encounter (Signed)
Pt states she stopped taking Carvedilol about a month and a half ago, it was not causing any problems she just wanted to know if she would feel better without and on a healthy diet and she does.  She completed Herceptin in Jan and had an echo in Feb with EF 45-50%.  Will discuss w/Dr Bensimhon and call her back next week with his recommendations.

## 2016-06-21 NOTE — Progress Notes (Signed)
Patient ID: Tamara [name], female   DOB: 1971/10/07, 45 y.o.   MRN: [id number]   Tamara [name], is a 45 y.o. female  LOV:[id number]  OAC:[id number]  DOB - 04-Feb-1971  Chief Complaint  Patient presents with  . Urinary Frequency    Burning; LBP x March        Subjective:   Tamara [name] is a 45 y.o. female patient has history of hypertension, iron deficiency anemia, BRCA 1 positive, breast cancer, status post bilateral mastectomy and total abdominal hysterectomy/BO, and DVT here today for a follow up visit. She complains of whitish to clear vaginal discharge for about 3 months and lately has had frequent urination mostly at night but no polyuria. Patient said she is trying to work out her marriage after divorce with her husband, and they got together sometimes in March this year and had intercourse, since then she has been having funny feelings in her vagina area, including on and off whitish discharge and occasional itchiness. Other than that one time sexual intercourse with her estranged husband, she has not had any encounter with another person. She was prescribed Monistat at the time and there was improvement. Lately she is having frequent urination but no dysuria, no hematuria. Patient stopped all her medications for hypertension, she claims now she wants to do pure Diet control and physical exercise. She does not want to take any medications anymore. She is awaiting reconstructive surgery for her breasts, but unfortunately Medicaid would not cover the requested or required surgery. Patient has No headache, No chest pain, No abdominal pain - No Nausea, No new weakness tingling or numbness, No Cough - SOB. She denies any fever, no chest pain, no other symptoms.  Problem  Urination Frequency  Low Back Pain, Non-Specific    ALLERGIES: Allergies  Allergen Reactions  . Compazine [Prochlorperazine Edisylate] Other (See Comments)    Stuttering    PAST MEDICAL HISTORY: Past Medical History    Diagnosis Date  . Endometriosis   . Hypertension   . Anemia   . Wears glasses   . Iron deficiency anemia, unspecified 02/25/2014  . BRCA1 positive     c.190T>G (p.Cys64Gly) @ [facility]   . History of blood transfusion     last 9'15. due to chemotherapy.  Marland Kitchen DVT (deep venous thrombosis) (HCC)     Right Subclavian and IJ.. Lovenox stopped 01-10-15 until after surgery planned 02-08-15.  . Fibroid tumor     02-02-15 remains with abdominal pain and some vaginal bleeding due to fibroids.  . Breast cancer ([place]) 02/01/14    ER-/PR-/Her2+, still receiving chemo 12/21/14, last Herceptin 02-02-15.Left breat cancer.  . Shortness of breath      when Hemoglobin low, now resolved after transfusions 9'15.  . Rash 02/22/2015  . PONV (postoperative nausea and vomiting)     needs scop patch    MEDICATIONS AT HOME: Prior to Admission medications   Medication Sig Start Date End Date Taking? Authorizing Provider  acetaminophen-codeine (TYLENOL #3) 300-30 MG tablet Take 1 tablet by mouth every 4 (four) hours as needed. 02/06/16  Yes Tresa Garter, MD  cyclobenzaprine (FLEXERIL) 10 MG tablet Take 1 tablet (10 mg total) by mouth 3 (three) times daily as needed for muscle spasms. 02/06/16  Yes Tresa Garter, MD  diphenhydrAMINE (BENADRYL) 25 MG tablet Take 1 tablet (25 mg total) by mouth every 6 (six) hours. 07/10/15  Yes Delsa Grana, PA-C  pantoprazole (PROTONIX) 40 MG tablet Take 1 tablet (40 mg total) by mouth  daily as needed (for acid reflex). 04/23/16  Yes Dorie Rank, MD  Biotin 5000 MCG TABS Take 10,000 mcg by mouth daily. Reported on 06/21/2016    Historical Provider, MD  carvedilol (COREG) 12.5 MG tablet Take 1.5 tablets (18.75 mg total) by mouth 2 (two) times daily with a meal. Patient not taking: Reported on 06/21/2016 01/05/16   Tresa Garter, MD  ferrous sulfate 325 (65 FE) MG tablet Take 1 tablet (325 mg total) by mouth 3 (three) times daily with meals. Patient not taking: Reported on 06/21/2016 04/11/15    Tresa Garter, MD  fluconazole (DIFLUCAN) 200 MG tablet Take 1 tablet (200 mg total) by mouth once. 06/21/16   Tresa Garter, MD  gabapentin (NEURONTIN) 100 MG capsule Take 1 capsule (100 mg total) by mouth 3 (three) times daily. Patient not taking: Reported on 06/21/2016 01/05/16   Tresa Garter, MD     Objective:   Filed Vitals:   06/21/16 1032  BP: 126/84  Pulse: 67  Temp: 97.8 F (36.6 C)  TempSrc: Oral  Weight: 210 lb 9.6 oz (95.528 kg)    Exam General appearance : Awake, alert, not in any distress. Speech Clear. Not toxic looking, obese HEENT: Atraumatic and Normocephalic, pupils equally reactive to light and accomodation Neck: Supple, no JVD. No cervical lymphadenopathy.  Chest: Good air entry bilaterally, no added sounds  CVS: S1 S2 regular, no murmurs.  Abdomen: Bowel sounds present, Non tender and not distended with no gaurding, rigidity or rebound. Extremities: B/L Lower Ext shows no edema, both legs are warm to touch Neurology: Awake alert, and oriented X 3, CN II-XII intact, Non focal Skin: No Rash  Data Review Lab Results  Component Value Date   HGBA1C 5.0 01/29/2014     Assessment & Plan   1. Urination frequency  - POCT urinalysis dipstick: Negative for UTI - fluconazole (DIFLUCAN) 200 MG tablet; Take 1 tablet (200 mg total) by mouth once.  Dispense: 1 tablet; Refill: 0  2. Low back pain, non-specific  - POCT urinalysis dipstick: Negative for UTI  3. Essential hypertension, benign  Patient has stopped taking all her medications Now trying diet control Blood pressure has been within acceptable range even without medications  We have discussed target BP range and blood pressure goal. I have advised patient to check BP regularly and to call us back or report to clinic if the numbers are consistently higher than 140/90. We discussed the importance of compliance with medical therapy and DASH diet recommended, consequences of uncontrolled  hypertension discussed.   Patient have been counseled extensively about nutrition and exercise  Return in about 3 months (around 09/21/2016) for Annual Physical, Routine Follow Up.  The patient was given clear instructions to go to ER or return to medical center if symptoms don't improve, worsen or new problems develop. The patient verbalized understanding. The patient was told to call to get lab results if they haven't heard anything in the next week.   This note has been created with Surveyor, quantity. Any transcriptional errors are unintentional.    Angelica Chessman, MD, [place], [place], [place], [place] and [facility], [place]   06/21/2016, 11:22 AM

## 2016-06-23 ENCOUNTER — Ambulatory Visit: Admission: RE | Admit: 2016-06-23 | Payer: Medicaid Other | Source: Ambulatory Visit

## 2016-07-09 ENCOUNTER — Encounter (HOSPITAL_COMMUNITY): Payer: Self-pay | Admitting: *Deleted

## 2016-07-18 ENCOUNTER — Encounter (HOSPITAL_COMMUNITY): Payer: Self-pay | Admitting: Emergency Medicine

## 2016-07-18 ENCOUNTER — Emergency Department (HOSPITAL_COMMUNITY)
Admission: EM | Admit: 2016-07-18 | Discharge: 2016-07-18 | Disposition: A | Discharge status: Home / Self Care | Payer: Medicaid Other | Attending: Emergency Medicine | Admitting: Emergency Medicine

## 2016-07-18 ENCOUNTER — Emergency Department (HOSPITAL_BASED_OUTPATIENT_CLINIC_OR_DEPARTMENT_OTHER)
Admit: 2016-07-18 | Discharge: 2016-07-18 | Disposition: A | Discharge status: Home / Self Care | Payer: Medicaid Other | Attending: Emergency Medicine | Admitting: Emergency Medicine

## 2016-07-18 DIAGNOSIS — Z87891 Personal history of nicotine dependence: Secondary | ICD-10-CM | POA: Insufficient documentation

## 2016-07-18 DIAGNOSIS — M79609 Pain in unspecified limb: Secondary | ICD-10-CM

## 2016-07-18 DIAGNOSIS — Z853 Personal history of malignant neoplasm of breast: Secondary | ICD-10-CM | POA: Diagnosis not present

## 2016-07-18 DIAGNOSIS — R609 Edema, unspecified: Secondary | ICD-10-CM | POA: Diagnosis not present

## 2016-07-18 DIAGNOSIS — I10 Essential (primary) hypertension: Secondary | ICD-10-CM | POA: Diagnosis not present

## 2016-07-18 DIAGNOSIS — M79605 Pain in left leg: Secondary | ICD-10-CM | POA: Diagnosis not present

## 2016-07-18 MED ORDER — KETOROLAC TROMETHAMINE 30 MG/ML IJ SOLN
30.0000 mg | Freq: Once | INTRAMUSCULAR | Status: AC
  Administered 2016-07-18: 30 mg via INTRAMUSCULAR
  Filled 2016-07-18: qty 1

## 2016-07-18 NOTE — ED Triage Notes (Addendum)
Pt c/o severe pain in L Lateral thigh. Pt sts she has had a bruise there for over a month, denies initial or current injury. Pt was seen for bruise and ruled out blood clot. Small bruise noted to L lateral thigh. No redness, warmth or swelling noted. Pt was told "it was just a bruise." Pt is taking acetaminophen with codeine without relief. A&Ox4 and ambulatory with pain. Denies numbness or tingling in leg or foot. Pt has hx of breast cancer and sts she is easily susceptible to blood clots.

## 2016-07-18 NOTE — ED Provider Notes (Signed)
[facility] DEPT Provider Note   CSN: [id number] Arrival date & time: 07/18/16  0753     History   Chief Complaint Chief Complaint  Patient presents with  . Leg Pain    HPI Tamara [name] is a 45 y.o. female.  HPI  Patient presents with concern of pain in the left leg. Patient has multiple medical issues including history of cancer, DVT. No current anticoagulant used. Patient had minor trauma about one month ago, suffering a contusion to the left lateral thigh. However, the patient notes that over the past 4 days in particular she has had increasing pain throughout the left proximal leg, interfering with ambulation, activity. No relief with OTC medication or Tylenol No. 3. No distal loss of sensation or weakness. No Chest pain, dyspnea.  Past Medical History:  Diagnosis Date  . Anemia   . BRCA1 positive    c.190T>G (p.Cys64Gly) @ [facility]   . Breast cancer ([place]) 02/01/14   ER-/PR-/Her2+, still receiving chemo 12/21/14, last Herceptin 02-02-15.Left breat cancer.  Marland Kitchen DVT (deep venous thrombosis) (HCC)    Right Subclavian and IJ.. Lovenox stopped 01-10-15 until after surgery planned 02-08-15.  . Endometriosis   . Fibroid tumor    02-02-15 remains with abdominal pain and some vaginal bleeding due to fibroids.  . History of blood transfusion    last 9'15. due to chemotherapy.  . Hypertension   . Iron deficiency anemia, unspecified 02/25/2014  . PONV (postoperative nausea and vomiting)    needs scop patch  . Rash 02/22/2015  . Shortness of breath     when Hemoglobin low, now resolved after transfusions 9'15.  . Wears glasses     Patient Active Problem List   Diagnosis Date Noted  . Urination frequency 06/21/2016  . Low back pain, non-specific 06/21/2016  . Acute deep vein thrombosis (DVT) of proximal end of right lower extremity ([place]) 12/15/2015  . Breast cancer, BRCA1 positive ([place]) 12/15/2015  . Genetic testing 10/10/2015  . Chest pain 08/28/2015  . Precordial chest pain  08/18/2015  . 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 Rash 02/22/2015  . Long term current use of anticoagulant therapy 02/22/2015  . 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 02/08/2015  . Heavy menstrual bleeding 01/11/2015  . New daily persistent headache 12/16/2014  . Essential hypertension, benign 12/16/2014  . Moderate recurrent major depression ([place]) 12/16/2014  . Cardiomyopathy ([place]) 10/20/2014  . Cardiomyopathy, ischemic 09/06/2014  . Fever 09/02/2014  . Neoplasm related pain 09/02/2014  . Anemia 08/07/2014  . Symptomatic anemia 08/07/2014  . Absence of breast 08/06/2014  . Breast cancer ([place]) 07/28/2014  . Bloodstream infection due to port-a-cath 05/30/2014  . Acute blood loss anemia 05/20/2014  . Staphylococcus aureus bacteremia with sepsis ([place]) 05/19/2014  . Sinus tachycardia ([place]) 05/19/2014  . AKI (acute kidney injury) ([place]) 05/19/2014  . Palpitations 05/05/2014  . BRCA1 positive   . Local infection due to port-a-cath 03/09/2014  . Iron deficiency anemia 02/25/2014  . HTN (hypertension) 02/19/2014  . Breast cancer of upper-outer quadrant of left female breast ([place]) 02/02/2014    Past Surgical History:  Procedure Laterality Date  . BILATERAL TOTAL MASTECTOMY WITH AXILLARY LYMPH NODE DISSECTION Bilateral 07/28/2014   Procedure: BILATERAL TOTAL MASTECTOMY WITH LEFT AXILLARY SENTINEL LYMPH NODE BIOPSY;  Surgeon: Stark Klein, MD;  Location: [facility];  Service: General;  Laterality: Bilateral;  . BREAST RECONSTRUCTION Bilateral 03/31/2015   Procedure: BILATERAL BREAST RECONSTRUCTION REVISION WITH BILATERAL LIPOFILLING;  Surgeon: Theodoro Kos, DO;  Location: [facility];  Service: Clinical cytogeneticist;  Laterality: Bilateral;  . BREAST RECONSTRUCTION WITH PLACEMENT OF TISSUE EXPANDER AND FLEX HD (ACELLULAR HYDRATED DERMIS) Bilateral 07/28/2014   Procedure: BILATERAL BREAST RECONSTRUCTION WITH PLACEMENT OF TISSUE EXPANDER AND FLEX HD (ACELLULAR HYDRATED DERMIS);  Surgeon: Theodoro Kos, DO;  Location: [facility];  Service: Plastics;  Laterality: Bilateral;  . CARDIAC CATHETERIZATION     10'15- Dr. Sung Amabile  . CERVICAL POLYPECTOMY  2010  . CESAREAN SECTION     one previous  . LEFT HEART CATHETERIZATION WITH CORONARY ANGIOGRAM N/A 09/13/2014   Procedure: LEFT HEART CATHETERIZATION WITH CORONARY ANGIOGRAM;  Surgeon: Jolaine Artist, MD;  Location: [facility] CATH LAB;  Service: Cardiovascular;  Laterality: N/A;  . LIPOSUCTION WITH LIPOFILLING Bilateral 03/31/2015   Procedure: LIPOSUCTION WITH LIPOFILLING;  Surgeon: Theodoro Kos, DO;  Location: [facility];  Service: Plastics;  Laterality: Bilateral;  . PORT-A-CATH REMOVAL N/A 05/21/2014   Procedure: REMOVAL PORT-A-CATH;  Surgeon: Zenovia Jarred, MD;  Location: [facility];  Service: General;  Laterality: N/A;  . PORTACATH PLACEMENT Right 02/23/2014   Procedure: INSERTION PORT-A-CATH;  Surgeon: Joyice Faster. Cornett, MD;  Location: [facility];  Service: General;  Laterality: Right;  . REMOVAL OF TISSUE EXPANDER AND PLACEMENT OF IMPLANT Bilateral 10/14/2014   Procedure: REMOVAL OF BILATERAL BREAST  TISSUE EXPANDER  WITH PLASCEMENT OF BILATERAL  BREAST IMPLANTS;  Surgeon: Theodoro Kos, DO;  Location: [facility];  Service: Plastics;  Laterality: Bilateral;  . ROBOTIC ASSISTED TOTAL HYSTERECTOMY WITH BILATERAL SALPINGO OOPHERECTOMY Bilateral 02/08/2015   Procedure: ROBOTIC ASSISTED TOTAL HYSTERECTOMY WITH BILATERAL SALPINGO OOPHORECTOMY; UTERUS WEIGHING GREATER THAN 250 GRAMS;  Surgeon: Everitt Amber, MD;  Location: [facility];  Service: Gynecology;  Laterality: Bilateral;  BRCA 1 GENE MUTATION  . TEE WITHOUT CARDIOVERSION N/A 05/26/2014   Procedure: TRANSESOPHAGEAL ECHOCARDIOGRAM (TEE);  Surgeon: Larey Dresser, MD;  Location: [facility];  Service: Cardiovascular;  Laterality: N/A;  . TUBAL LIGATION      OB History    Gravida Para Term Preterm AB Living   5 5 5     5    SAB TAB Ectopic Multiple Live  Births                   Home Medications    Prior to Admission medications   Medication Sig Start Date End Date Taking? Authorizing Provider  acetaminophen-codeine (TYLENOL #3) 300-30 MG tablet Take 1 tablet by mouth every 4 (four) hours as needed. 02/06/16   Tresa Garter, MD  Biotin 5000 MCG TABS Take 10,000 mcg by mouth daily. Reported on 06/21/2016    Historical Provider, MD  carvedilol (COREG) 12.5 MG tablet Take 1.5 tablets (18.75 mg total) by mouth 2 (two) times daily with a meal. Patient not taking: Reported on 06/21/2016 01/05/16   Tresa Garter, MD  cyclobenzaprine (FLEXERIL) 10 MG tablet Take 1 tablet (10 mg total) by mouth 3 (three) times daily as needed for muscle spasms. 02/06/16   Tresa Garter, MD  diphenhydrAMINE (BENADRYL) 25 MG tablet Take 1 tablet (25 mg total) by mouth every 6 (six) hours. 07/10/15   Delsa Grana, PA-C  ferrous sulfate 325 (65 FE) MG tablet Take 1 tablet (325 mg total) by mouth 3 (three) times daily with meals. Patient not taking: Reported on 06/21/2016 04/11/15   Tresa Garter, MD  fluconazole (DIFLUCAN) 200 MG tablet Take 1 tablet (200 mg total) by mouth once. 06/21/16   Tresa Garter, MD  gabapentin (NEURONTIN) 100  MG capsule Take 1 capsule (100 mg total) by mouth 3 (three) times daily. Patient not taking: Reported on 06/21/2016 01/05/16   Tresa Garter, MD  pantoprazole (PROTONIX) 40 MG tablet Take 1 tablet (40 mg total) by mouth daily as needed (for acid reflex). 04/23/16   Dorie Rank, MD    Family History Family History  Problem Relation Age of Onset  . Breast cancer Mother 34    currently 45  . Diabetes Father   . Pancreatic cancer Father 65  . Breast cancer Paternal Aunt 49    currently 60; BRCA1 positive  . Stroke Maternal Grandfather   . Cancer Paternal Aunt     unk. primary; deceased 47s  . Breast cancer Cousin     daughter of unaffected paternal aunt; dx in her 54s    Social History Social History  Substance  Use Topics  . Smoking status: Former Smoker    Packs/day: 0.25    Years: 15.00    Quit date: 02/18/2009  . Smokeless tobacco: Former Systems developer  . Alcohol use No     Comment: occasional     Allergies   Compazine [prochlorperazine edisylate]   Review of Systems Review of Systems  Constitutional:       Per HPI, otherwise negative  HENT:       Per HPI, otherwise negative  Respiratory:       Per HPI, otherwise negative  Cardiovascular:       Per HPI, otherwise negative  Gastrointestinal: Negative for vomiting.  Endocrine:       Negative aside from HPI  Genitourinary:       Neg aside from HPI   Musculoskeletal:       Per HPI, otherwise negative  Skin: Positive for color change.  Neurological: Negative for syncope.     Physical Exam Updated Vital Signs BP 136/78 (BP Location: Left Arm)   Pulse 70   Temp 97.8 F (36.6 C) (Oral)   Resp 16   Ht 5' 5"  (1.651 m)   Wt 207 lb (93.9 kg)   LMP 02/19/2014 Comment: chemo  SpO2 96%   BMI 34.45 kg/m   Physical Exam  Constitutional: She is oriented to person, place, and time. She appears well-developed and well-nourished. No distress.  HENT:  Head: Normocephalic and atraumatic.  Eyes: Conjunctivae and EOM are normal.  Cardiovascular: Normal rate and regular rhythm.   Pulmonary/Chest: Effort normal and breath sounds normal. No stridor. No respiratory distress.  Abdominal: She exhibits no distension.  Musculoskeletal: She exhibits no edema.  About the left lateral mid thigh there is mild tenderness to palpation, slight indurated area, no superficial changes. Patient flexes the hips, knees, ankles independently, appropriate. No asymmetric swelling  Neurological: She is alert and oriented to person, place, and time. No cranial nerve deficit.  Skin: Skin is warm and dry.  Psychiatric: She has a normal mood and affect.  Nursing note and vitals reviewed.    ED Treatments / Results   Radiology [place]  reassuring   Procedures Procedures (including critical care time)  Medications Ordered in ED Medications  ketorolac (TORADOL) 30 MG/ML injection 30 mg (30 mg Intramuscular Given 07/18/16 0840)     Initial Impression / Assessment and Plan / ED Course  I have reviewed the triage vital signs and the nursing notes.  Pertinent labs & imaging results that were available during my care of the patient were reviewed by me and considered in my medical decision making (see chart for details).  11:57 AM Patient states that her pain is resolved. She has an appointment tomorrow with her primary care physician, and will discuss physical therapy, ongoing management.   This patient with history of DVT, cancer, presents with ongoing persistent pain in the left lateral thigh, swelling, subjective. Given her increased inability to use the leg, ultrasounds performed. This was reassuring, and the patient's pain resolved here. No evidence for cellulitis, DVT, systemic pathology. Patient will follow up tomorrow with primary care.   Carmin Muskrat, MD 07/18/16 1209

## 2016-07-18 NOTE — Discharge Instructions (Signed)
As discussed, your evaluation today has been largely reassuring.  But, it is important that you monitor your condition carefully, and do not hesitate to return to the ED if you develop new, or concerning changes in your condition. ? ?Otherwise, please follow-up with your physician for appropriate ongoing care. ? ?

## 2016-07-18 NOTE — Progress Notes (Signed)
*  PRELIMINARY RESULTS* Vascular Ultrasound Left lower extremity venous duplex has been completed.  Preliminary findings: No evidence of DVT or baker's cyst.  Landry Mellow, RDMS, RVT  07/18/2016, 8:55 AM

## 2016-07-18 NOTE — ED Notes (Signed)
The vascular tech. Is here to perform doppler study.

## 2016-07-19 ENCOUNTER — Ambulatory Visit: Payer: Medicaid Other | Attending: Internal Medicine | Admitting: Internal Medicine

## 2016-07-19 ENCOUNTER — Encounter: Payer: Self-pay | Admitting: Internal Medicine

## 2016-07-19 VITALS — BP 121/76 | HR 75 | Temp 98.2°F | Resp 18 | Ht 65.0 in | Wt 210.4 lb

## 2016-07-19 DIAGNOSIS — D509 Iron deficiency anemia, unspecified: Secondary | ICD-10-CM | POA: Insufficient documentation

## 2016-07-19 DIAGNOSIS — Z90710 Acquired absence of both cervix and uterus: Secondary | ICD-10-CM | POA: Insufficient documentation

## 2016-07-19 DIAGNOSIS — Z9013 Acquired absence of bilateral breasts and nipples: Secondary | ICD-10-CM | POA: Diagnosis not present

## 2016-07-19 DIAGNOSIS — Z86718 Personal history of other venous thrombosis and embolism: Secondary | ICD-10-CM | POA: Diagnosis not present

## 2016-07-19 DIAGNOSIS — Z888 Allergy status to other drugs, medicaments and biological substances status: Secondary | ICD-10-CM | POA: Insufficient documentation

## 2016-07-19 DIAGNOSIS — Z9221 Personal history of antineoplastic chemotherapy: Secondary | ICD-10-CM | POA: Insufficient documentation

## 2016-07-19 DIAGNOSIS — Z90722 Acquired absence of ovaries, bilateral: Secondary | ICD-10-CM | POA: Diagnosis not present

## 2016-07-19 DIAGNOSIS — Z853 Personal history of malignant neoplasm of breast: Secondary | ICD-10-CM | POA: Insufficient documentation

## 2016-07-19 DIAGNOSIS — I10 Essential (primary) hypertension: Secondary | ICD-10-CM | POA: Insufficient documentation

## 2016-07-19 DIAGNOSIS — M25552 Pain in left hip: Secondary | ICD-10-CM | POA: Insufficient documentation

## 2016-07-19 MED ORDER — KETOROLAC TROMETHAMINE 30 MG/ML IM SOLN: 30.0000 mg | Freq: Once | INTRAMUSCULAR | 0 refills | Status: DC

## 2016-07-19 MED ORDER — KETOROLAC TROMETHAMINE 30 MG/ML IJ SOLN
30.0000 mg | Freq: Once | INTRAMUSCULAR | Status: AC
  Administered 2016-07-19: 30 mg via INTRAMUSCULAR

## 2016-07-19 NOTE — Progress Notes (Signed)
Tamara [name], is a 45 y.o. female  [id number]  [id number]  DOB - 03-16-1971  Chief Complaint  Patient presents with  . Follow-up      Subjective:   Tamara [name] is a 45 y.o. female with history of hypertension, iron deficiency anemia, breast cancer status post bilateral mastectomy and total abdominal hysterectomy/bilateral oophorectomy and DVT here today for an ED visit follow up. Patient was seen in the ED yesterday with complaint of bruising on her left thigh that appeared suddenly the day prior to presentation. Patient had had a minor trauma about a month ago suffering a contusion to the left lateral thigh however at that time there was no significant redness or bleeding/bruises. But over the 4 days prior to going to the emergency room, patient has had increasing pain throughout the left leg that interfered with ambulation and activities. Patient also complained of significant pain in her left hip joint. She however denies any weakness or tingling, no numbness. No swelling. Imaging studies to the left hip showed no evidence of fracture or dislocation, no evidence of arthropathy or other focal bone abnormality. Patient is not on any anticoagulation at this time. Pain is much better today but still present, rated at a 6/10. Patient has no new complaints today. She denies any major depression, no suicidal ideation or thoughts. Patient has No headache, No chest pain, No abdominal pain, No Nausea, No new weakness tingling or numbness, No Cough - SOB.  Problem  Left Hip Pain    ALLERGIES: Allergies  Allergen Reactions  . Compazine [Prochlorperazine Edisylate] Other (See Comments)    Stuttering    PAST MEDICAL HISTORY: Past Medical History:  Diagnosis Date  . Anemia   . BRCA1 positive    c.190T>G (p.Cys64Gly) @ [facility]   . Breast cancer ([place]) 02/01/14   ER-/PR-/Her2+, still receiving chemo 12/21/14, last Herceptin 02-02-15.Left breat cancer.  Marland Kitchen DVT (deep venous thrombosis) (HCC)      Right Subclavian and IJ.. Lovenox stopped 01-10-15 until after surgery planned 02-08-15.  . Endometriosis   . Fibroid tumor    02-02-15 remains with abdominal pain and some vaginal bleeding due to fibroids.  . History of blood transfusion    last 9'15. due to chemotherapy.  . Hypertension   . Iron deficiency anemia, unspecified 02/25/2014  . PONV (postoperative nausea and vomiting)    needs scop patch  . Rash 02/22/2015  . Shortness of breath     when Hemoglobin low, now resolved after transfusions 9'15.  . Wears glasses     MEDICATIONS AT HOME: Prior to Admission medications   Medication Sig Start Date End Date Taking? Authorizing Provider  acetaminophen-codeine (TYLENOL #3) 300-30 MG tablet Take 1 tablet by mouth every 4 (four) hours as needed. 02/06/16  Yes Tresa Garter, MD  Biotin 5000 MCG TABS Take 10,000 mcg by mouth daily. Reported on 06/21/2016   Yes Historical Provider, MD  carvedilol (COREG) 12.5 MG tablet Take 1.5 tablets (18.75 mg total) by mouth 2 (two) times daily with a meal. 01/05/16  Yes Tresa Garter, MD  cyclobenzaprine (FLEXERIL) 10 MG tablet Take 1 tablet (10 mg total) by mouth 3 (three) times daily as needed for muscle spasms. 02/06/16  Yes Tresa Garter, MD  diphenhydrAMINE (BENADRYL) 25 MG tablet Take 1 tablet (25 mg total) by mouth every 6 (six) hours. 07/10/15  Yes Delsa Grana, PA-C  ferrous sulfate 325 (65 FE) MG tablet Take 1 tablet (325 mg total) by mouth 3 (three)  times daily with meals. 04/11/15  Yes Tresa Garter, MD  gabapentin (NEURONTIN) 100 MG capsule Take 1 capsule (100 mg total) by mouth 3 (three) times daily. 01/05/16  Yes Tresa Garter, MD  pantoprazole (PROTONIX) 40 MG tablet Take 1 tablet (40 mg total) by mouth daily as needed (for acid reflex). 04/23/16  Yes Dorie Rank, MD    Objective:   Vitals:   07/19/16 1437  BP: 121/76  Pulse: 75  Resp: 18  Temp: 98.2 F (36.8 C)  TempSrc: Oral  SpO2: 97%  Weight: 210 lb 6.4 oz (95.4  kg)  Height: 5' 5"  (1.651 m)    Exam General appearance : Awake, alert, not in any distress. Speech Clear. Not toxic looking HEENT: Atraumatic and Normocephalic, pupils equally reactive to light and accomodation Neck: Supple, no JVD. No cervical lymphadenopathy.  Chest: Good air entry bilaterally, no added sounds  CVS: S1 S2 regular, no murmurs.  Abdomen: Bowel sounds present, Non tender and not distended with no gaurding, rigidity or rebound. Extremities: B/L Lower Ext shows no edema, both legs are warm to touch Neurology: Awake alert, and oriented X 3, CN II-XII intact, Non focal Skin: No Rash  Data Review Lab Results  Component Value Date   HGBA1C 5.0 06/21/2016   HGBA1C 5.0 01/29/2014   Assessment & Plan   1. Essential hypertension, benign  We have discussed target BP range and blood pressure goal. I have advised patient to check BP regularly and to call us back or report to clinic if the numbers are consistently higher than 140/90. We discussed the importance of compliance with medical therapy and DASH diet recommended, consequences of uncontrolled hypertension discussed.  - continue current BP medications  2. Left hip pain  - I.m Toradol injection 30 mg x 1 - Will refer patient to PT for gait and strength training - Ambulatory referral to Physical Therapy  Patient have been counseled extensively about nutrition and exercise  Return in about 3 months (around 10/19/2016) for Follow up Pain and comorbidities, Follow up HTN.  The patient was given clear instructions to go to ER or return to medical center if symptoms don't improve, worsen or new problems develop. The patient verbalized understanding. The patient was told to call to get lab results if they haven't heard anything in the next week.   This note has been created with Surveyor, quantity. Any transcriptional errors are unintentional.    Angelica Chessman, MD, [place],  [place], [place], [place] and [place], [place]   07/19/2016, 3:16 PM

## 2016-07-19 NOTE — Patient Instructions (Signed)

## 2016-07-19 NOTE — Progress Notes (Signed)
Patient is here for FU LEG BRUISE  Patient complains of thigh bruise on the left side being present for the past month.  Patient has taken medication today. Patient has eaten.  Patient declined flu vaccine. Patient tolerated Toradol injection well.  Patient denies any suicidal ideations at this time.

## 2016-07-31 ENCOUNTER — Ambulatory Visit: Payer: Medicaid Other | Attending: Internal Medicine

## 2016-07-31 DIAGNOSIS — M25552 Pain in left hip: Secondary | ICD-10-CM | POA: Insufficient documentation

## 2016-07-31 NOTE — Therapy (Signed)
[facility] [place], [place], [address] Phone: [phone]   Fax:  [phone]  Patient Details  Name: Tamara [name] MRN: [id number] Date of Birth: 24-Jan-1971 Referring Provider:  Tresa Garter, MD  Encounter Date: 07/31/2016      Ms Lina Sar arrived today for assessment of her LT hip pain . She was informed that Medicaid would not pay for treatment visits  after the evaluation  with her diagnosis. She was offered to be seen self pay but was not happy with that prospect and after answering the initial questions of intake she decided to leave and not complete the evaluation  stating she was a cancer survivor. Unfortunately, Medicaid does not pay for this treatment and I think that upset her. Sorry we were not able to help her.    Darrel Hoover  PT 07/31/2016, 3:46 PM  [facility] [address] [place], [place], [address] Phone: [phone]   Fax:  [phone]

## 2016-09-06 ENCOUNTER — Ambulatory Visit
Admission: RE | Admit: 2016-09-06 | Discharge: 2016-09-06 | Disposition: A | Discharge status: Home / Self Care | Payer: Medicaid Other | Source: Ambulatory Visit | Attending: Oncology | Admitting: Oncology

## 2016-09-06 DIAGNOSIS — C50412 Malignant neoplasm of upper-outer quadrant of left female breast: Secondary | ICD-10-CM

## 2016-09-06 MED ORDER — GADOBENATE DIMEGLUMINE 529 MG/ML IV SOLN
19.0000 mL | Freq: Once | INTRAVENOUS | Status: AC | PRN
Start: 2016-09-06 — End: 2016-09-06
  Administered 2016-09-06: 19 mL via INTRAVENOUS

## 2016-09-20 ENCOUNTER — Ambulatory Visit: Payer: Medicaid Other | Attending: Internal Medicine | Admitting: Internal Medicine

## 2016-09-20 ENCOUNTER — Other Ambulatory Visit: Payer: Self-pay

## 2016-09-20 ENCOUNTER — Encounter: Payer: Self-pay | Admitting: Internal Medicine

## 2016-09-20 ENCOUNTER — Encounter: Payer: Self-pay | Admitting: Licensed Clinical Social Worker

## 2016-09-20 VITALS — BP 161/104 | HR 70 | Temp 98.6°F | Resp 16 | Wt 214.0 lb

## 2016-09-20 DIAGNOSIS — F4323 Adjustment disorder with mixed anxiety and depressed mood: Secondary | ICD-10-CM | POA: Diagnosis not present

## 2016-09-20 DIAGNOSIS — I10 Essential (primary) hypertension: Secondary | ICD-10-CM | POA: Insufficient documentation

## 2016-09-20 DIAGNOSIS — R071 Chest pain on breathing: Secondary | ICD-10-CM | POA: Insufficient documentation

## 2016-09-20 DIAGNOSIS — Z79899 Other long term (current) drug therapy: Secondary | ICD-10-CM | POA: Insufficient documentation

## 2016-09-20 LAB — CBC WITH DIFFERENTIAL/PLATELET
Basophils Absolute: 0 cells/uL (ref 0–200)
Basophils Relative: 0 %
EOS ABS: 252 {cells}/uL (ref 15–500)
Eosinophils Relative: 3 %
HEMATOCRIT: 41.9 % (ref 35.0–45.0)
Hemoglobin: 13.4 g/dL (ref 11.7–15.5)
LYMPHS PCT: 30 %
Lymphs Abs: 2520 cells/uL (ref 850–3900)
MCH: 28.4 pg (ref 27.0–33.0)
MCHC: 32 g/dL (ref 32.0–36.0)
MCV: 88.8 fL (ref 80.0–100.0)
MONO ABS: 504 {cells}/uL (ref 200–950)
MPV: 9.6 fL (ref 7.5–12.5)
Monocytes Relative: 6 %
NEUTROS PCT: 61 %
Neutro Abs: 5124 cells/uL (ref 1500–7800)
Platelets: 323 10*3/uL (ref 140–400)
RBC: 4.72 MIL/uL (ref 3.80–5.10)
RDW: 12.7 % (ref 11.0–15.0)
WBC: 8.4 10*3/uL (ref 3.8–10.8)

## 2016-09-20 LAB — D-DIMER, QUANTITATIVE: D-Dimer, Quant: <0.19 mcg/mL FEU (ref <0.50)

## 2016-09-20 MED ORDER — FERROUS SULFATE 325 (65 FE) MG PO TABS: 325.0000 mg | ORAL_TABLET | Freq: Three times a day (TID) | ORAL | 3 refills | Status: DC

## 2016-09-20 MED ORDER — BUPROPION HCL 100 MG PO TABS: 100.0000 mg | ORAL_TABLET | Freq: Two times a day (BID) | ORAL | 3 refills | Status: DC

## 2016-09-20 NOTE — Progress Notes (Signed)
Tamara [name], is a 45 y.o. female  NLG:[id number]  [id number]:[id number]  DOB - [birth year]  Chief Complaint  Patient presents with  . Chest Pain      Subjective:   Tamara [name] is a 45 y.o. female with history of hypertension, iron deficiency anemia, breast cancer status post bilateral mastectomy and total abdominal hysterectomy/bilateral oophorectomy and DVT here today with major complain of chest pain that started few days ago. 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 She has no cough, no fever, but some sore throat and running nose. Her chest pain is associated with some shortness of breath but not outright difficulty breathing. She also said she has been going through a lot of stress lately, from divorce to having 4 children in the college with little financial resources, and needing to work too hard for the little money she gets. Plus her job does not "care" about her medical history, the will give her any assignment regardless. She was tearful during this encounter. She is also very anxious about her breast cancer MRI follow up that she did recently, awaiting the report and follow up with her oncologist. She has no cough or hemoptysis, no weight loss. No headache, No abdominal pain, No Nausea, No new weakness tingling or numbness.  Problem  Adjustment Disorder With Mixed Anxiety and Depressed Mood   ALLERGIES: Allergies  Allergen Reactions  . Compazine [Prochlorperazine Edisylate] Other (See Comments)    Stuttering    PAST MEDICAL HISTORY: Past Medical History:  Diagnosis Date  . Anemia   . BRCA1 positive    c.190T>G (p.Cys64Gly) @ [facility]   . Breast cancer ([facility]) 02/01/14   ER-/PR-/Her2+, still receiving chemo 12/21/14, last Herceptin 02-02-15.Left breat cancer.  Marland Kitchen DVT (deep venous thrombosis) ([facility])      Right Subclavian and IJ.. Lovenox stopped 01-10-15 until after surgery planned 02-08-15.  . Endometriosis   . Fibroid tumor    02-02-15 remains with abdominal pain and some vaginal bleeding due to fibroids.  . History of blood transfusion    last 9'15. due to chemotherapy.  . Hypertension   . Iron deficiency anemia, unspecified 02/25/2014  . PONV (postoperative nausea and vomiting)    needs scop patch  . Rash 02/22/2015  . Shortness of breath     when Hemoglobin low, now resolved after transfusions 9'15.  . Wears glasses     MEDICATIONS AT HOME: Prior to Admission medications   Medication Sig Start Date End Date Taking? Authorizing Provider  acetaminophen-codeine (TYLENOL #3) 300-30 MG tablet Take 1 tablet by mouth every 4 (four) hours as needed. 02/06/16  Yes Tresa Garter, MD  Biotin 5000 MCG TABS Take 10,000 mcg by mouth daily. Reported on 06/21/2016   Yes Historical Provider, MD  carvedilol (COREG) 12.5 MG tablet Take 1.5 tablets (18.75 mg total) by mouth 2 (two) times daily with a meal. Patient not taking: Reported on 09/20/2016 01/05/16   Tresa Garter, MD  cyclobenzaprine (FLEXERIL) 10 MG tablet Take 1 tablet (10 mg total) by mouth 3 (three) times daily as needed for muscle spasms. Patient not taking: Reported on 09/20/2016 02/06/16   Tresa Garter, MD  diphenhydrAMINE (BENADRYL) 25 MG tablet Take 1 tablet (25 mg total) by mouth every 6 (six) hours. Patient not taking: Reported  on 09/20/2016 07/10/15   Delsa Grana, PA-C  ferrous sulfate 325 (65 FE) MG tablet Take 1 tablet (325 mg total) by mouth 3 (three) times daily with meals. Patient not taking: Reported on 09/20/2016 04/11/15   Tresa Garter, MD  gabapentin (NEURONTIN) 100 MG capsule Take 1 capsule (100 mg total) by mouth 3 (three) times daily. Patient not taking: Reported on 09/20/2016 01/05/16   Tresa Garter, MD  pantoprazole (PROTONIX) 40 MG tablet Take 1 tablet (40 mg total) by mouth daily as needed (for  acid reflex). Patient not taking: Reported on 09/20/2016 04/23/16   Dorie Rank, MD    Objective:   Vitals:   09/20/16 0900  BP: (!) 161/104  Pulse: 70  Resp: 16  Temp: 98.6 F (37 C)  TempSrc: Oral  SpO2: 98%  Weight: 214 lb (97.1 kg)   Exam General appearance : Awake, alert, not in any distress. Speech Clear. Not toxic looking HEENT: Atraumatic and Normocephalic, pupils equally reactive to light and accomodation Neck: Supple, no JVD. No cervical lymphadenopathy.  Chest: Good air entry bilaterally, no added sounds  CVS: S1 S2 regular, no murmurs.  Abdomen: Bowel sounds present, Non tender and not distended with no gaurding, rigidity or rebound. Extremities: B/L Lower Ext shows no edema, both legs are warm to touch Neurology: Awake alert, and oriented X 3, CN II-XII intact, Non focal Skin: No Rash  Data Review Lab Results  Component Value Date   HGBA1C 5.0 06/21/2016   HGBA1C 5.0 01/29/2014    Assessment & Plan   1. Essential hypertension: Patient is not taking her medications as prescribed.   We have discussed target BP range and blood pressure goal. I have advised patient to check BP regularly and to call us back or report to clinic if the numbers are consistently higher than 140/90. We discussed the importance of compliance with medical therapy and DASH diet recommended, consequences of uncontrolled hypertension discussed.  - continue current BP medications  2. Chest pain on breathing  - EKG in the office today showed NSR, no specific ST-T wave changes - CT Angio Chest W/Cm &/Or Wo Cm; Future: Patient with hx of DVT and malignancy, high risk for PE - CBC with Differential/Platelet - D-dimer, quantitative - Trial of PPI  3. Adjustment disorder with mixed anxiety and depressed mood  - Seen by our LCSW today for counseling - Start patient on Wellbutrin and Anxiolytics  Patient have been counseled extensively about nutrition and exercise. Other issues discussed  during this visit include: low cholesterol diet, weight control and daily exercise, importance of adherence with medications and regular follow-up. We also discussed long term complications of uncontrolled hypertension.   Return in about 3 months (around 12/21/2016) for Generalized Anxiety Disorder, Follow up Pain and comorbidities, Follow up HTN.  The patient was given clear instructions to go to ER or return to medical center if symptoms don't improve, worsen or new problems develop. The patient verbalized understanding. The patient was told to call to get lab results if they haven't heard anything in the next week.   This note has been created with Surveyor, quantity. Any transcriptional errors are unintentional.    Angelica Chessman, MD, [place], [place], [place], [place], [place]   09/20/2016, 9:45 AM

## 2016-09-20 NOTE — Patient Instructions (Signed)

## 2016-09-20 NOTE — BH Specialist Note (Signed)
Session Start time: 9:40 am   End Time: 10:05 pm Total Time:  25 minutes Type of Service: [place] Interpreter: No.   Interpreter Name & Language: N/A # [facility] Visits July 2017-June 2018: 1st   SUBJECTIVE: Annettie Mullan is a 45 y.o. female  Pt. was referred by Dr. Doreene Burke for:  anxiety and depression. Pt. reports the following symptoms/concerns: feelings of sadness, crying episodes, low energy Duration of problem:  Ten months Severity: Moderate Previous treatment: No previous treatment   OBJECTIVE: Mood: Depressed & Affect: Tearful Risk of harm to self or others: Pt denied SI/HI Assessments administered: PHQ-9; GAD-7  LIFE CONTEXT:  Family & Social: Pt has five children and is recently divorced. Four children are enrolled in college and youngest daughter is still in [place]. Extended family reside out of state in [place]. 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  Life changes: Pt was diagnosed with breast cancer resulting in a double mastectomy. Pt is unhappy with recent plastic surgery and is unable to have issue corrected due to insurance.Pt is currently grieving the loss of an ex and grandparents What is important to pt/family (values): Family, Spirituality, Health   GOALS ADDRESSED:  Decrease symptoms of depression Decrease symptoms of anxiety  INTERVENTIONS: Motivational Interviewing, Strength-based and Supportive   ASSESSMENT:  Pt currently experiencing depression and anxiety triggered by breast cancer diagnoses, unhappiness with recent surgery, the death of loved ones, and recent divorce after 3 years of marriage. Pt disclosed feelings of sadness, crying episodes, and low energy. Pt may benefit from psychotherapy to implement appropriate interventions to decrease symptoms of depression and anxiety. LCSWA provided psychoeducation on the cycle of depression and grief. Pt  reports willingness to initiate behavioral health counseling with LCSWA.     PLAN: 1. F/U with behavioral health clinician: Pt was encouraged to contact LCSWA if symptoms worsen or fail to improve to schedule [facility] appointments at [facility].  2. Behavioral Health meds: Wellbutrin 3. Behavioral recommendations: Pt is encouraged to schedule follow up appointment with LCSWA 4. Referral: Brief Counseling/Psychotherapy, Psychoeducation and Supportive Counseling 5. From scale of 1-10, how likely are you to follow plan: 7/10   Rebekah Chesterfield, MSW, [facility] Worker 09/20/16 2:55 PM  Warmhandoff:   Warm Hand Off Completed.

## 2016-09-20 NOTE — Progress Notes (Signed)
Pt is in the office today for chest pain Pt states the pain started after she had a double mastectomy Pt states her pain level today in the office is a 8 Pt states her chest pain gets worse when she bend down or lay flat on her back

## 2016-09-26 ENCOUNTER — Ambulatory Visit (HOSPITAL_COMMUNITY)
Admission: RE | Admit: 2016-09-26 | Discharge: 2016-09-26 | Disposition: A | Discharge status: Home / Self Care | Payer: Medicaid Other | Source: Ambulatory Visit | Attending: Internal Medicine | Admitting: Internal Medicine

## 2016-09-26 ENCOUNTER — Encounter (HOSPITAL_COMMUNITY): Payer: Self-pay

## 2016-09-26 ENCOUNTER — Telehealth: Payer: Self-pay | Admitting: *Deleted

## 2016-09-26 DIAGNOSIS — R911 Solitary pulmonary nodule: Secondary | ICD-10-CM | POA: Insufficient documentation

## 2016-09-26 DIAGNOSIS — R071 Chest pain on breathing: Secondary | ICD-10-CM | POA: Diagnosis present

## 2016-09-26 MED ORDER — IOPAMIDOL (ISOVUE-370) INJECTION 76%
INTRAVENOUS | Status: AC
  Administered 2016-09-26: 70 mL
  Filled 2016-09-26: qty 100

## 2016-09-26 NOTE — Telephone Encounter (Signed)
Patient verified DOB Patient is aware of lab results being normal. Patient complains of first incident of blood in stool being present this morning with loose stool. Patient states the blood was in the feces and was bright red. The blood did not fill the tissue when patient wiped but streaks of blood were present on the tissue. Patient would like to be advised. MA informed patient of concern being routed to PCP.

## 2016-09-26 NOTE — Telephone Encounter (Signed)
-----   Message from Tresa Garter, MD sent at 09/24/2016  6:16 PM EDT ----- Please inform patients that lab results are normal

## 2016-09-28 NOTE — Telephone Encounter (Signed)
-----   Message from Tresa Garter, MD sent at 09/26/2016  4:42 PM EDT ----- Please inform patient that her chest CT is negative for clot in the lungs which was our major concern for her complaint of shortness of breath and chest pain.

## 2016-09-28 NOTE — Telephone Encounter (Signed)
Patient verified DOB Patient is aware of no Blood Clots being present in the CT of chest. Patient is aware this was the main concern for the SOB and chest pain. Patient expressed her understanding and had no further questions at this time.

## 2016-10-04 ENCOUNTER — Telehealth: Payer: Self-pay

## 2016-10-04 NOTE — Telephone Encounter (Signed)
Faxed fmla paperwork for Tamara [name] to Brayton Mars at [employer]

## 2016-10-15 ENCOUNTER — Other Ambulatory Visit: Payer: Self-pay | Admitting: *Deleted

## 2016-10-15 DIAGNOSIS — C50412 Malignant neoplasm of upper-outer quadrant of left female breast: Secondary | ICD-10-CM

## 2016-10-16 ENCOUNTER — Ambulatory Visit (HOSPITAL_BASED_OUTPATIENT_CLINIC_OR_DEPARTMENT_OTHER): Payer: Medicaid Other | Admitting: Oncology

## 2016-10-16 ENCOUNTER — Other Ambulatory Visit (HOSPITAL_BASED_OUTPATIENT_CLINIC_OR_DEPARTMENT_OTHER): Payer: Medicaid Other

## 2016-10-16 VITALS — BP 127/81 | HR 76 | Temp 98.3°F | Resp 18 | Ht 65.0 in | Wt 216.9 lb

## 2016-10-16 DIAGNOSIS — F418 Other specified anxiety disorders: Secondary | ICD-10-CM

## 2016-10-16 DIAGNOSIS — Z1501 Genetic susceptibility to malignant neoplasm of breast: Principal | ICD-10-CM

## 2016-10-16 DIAGNOSIS — C50412 Malignant neoplasm of upper-outer quadrant of left female breast: Secondary | ICD-10-CM | POA: Diagnosis not present

## 2016-10-16 DIAGNOSIS — C50912 Malignant neoplasm of unspecified site of left female breast: Secondary | ICD-10-CM

## 2016-10-16 DIAGNOSIS — N951 Menopausal and female climacteric states: Secondary | ICD-10-CM

## 2016-10-16 DIAGNOSIS — Z171 Estrogen receptor negative status [ER-]: Secondary | ICD-10-CM | POA: Diagnosis not present

## 2016-10-16 DIAGNOSIS — Z86718 Personal history of other venous thrombosis and embolism: Secondary | ICD-10-CM | POA: Diagnosis not present

## 2016-10-16 DIAGNOSIS — C773 Secondary and unspecified malignant neoplasm of axilla and upper limb lymph nodes: Secondary | ICD-10-CM

## 2016-10-16 LAB — CBC WITH DIFFERENTIAL/PLATELET
BASO%: 1.1 % (ref 0.0–2.0)
Basophils Absolute: 0.1 10*3/uL (ref 0.0–0.1)
EOS%: 2.5 % (ref 0.0–7.0)
Eosinophils Absolute: 0.2 10*3/uL (ref 0.0–0.5)
HCT: 40.6 % (ref 34.8–46.6)
HGB: 13.3 g/dL (ref 11.6–15.9)
LYMPH%: 25.9 % (ref 14.0–49.7)
MCH: 28.9 pg (ref 25.1–34.0)
MCHC: 32.7 g/dL (ref 31.5–36.0)
MCV: 88.4 fL (ref 79.5–101.0)
MONO#: 0.5 10*3/uL (ref 0.1–0.9)
MONO%: 5.3 % (ref 0.0–14.0)
NEUT%: 65.2 % (ref 38.4–76.8)
NEUTROS ABS: 6.6 10*3/uL — AB (ref 1.5–6.5)
PLATELETS: 338 10*3/uL (ref 145–400)
RBC: 4.6 10*6/uL (ref 3.70–5.45)
RDW: 13.2 % (ref 11.2–14.5)
WBC: 10.1 10*3/uL (ref 3.9–10.3)
lymph#: 2.6 10*3/uL (ref 0.9–3.3)

## 2016-10-16 LAB — COMPREHENSIVE METABOLIC PANEL
ALT: 19 U/L (ref 0–55)
ANION GAP: 12 meq/L — AB (ref 3–11)
AST: 17 U/L (ref 5–34)
Albumin: 3.9 g/dL (ref 3.5–5.0)
Alkaline Phosphatase: 110 U/L (ref 40–150)
BUN: 16.3 mg/dL (ref 7.0–26.0)
CALCIUM: 9.9 mg/dL (ref 8.4–10.4)
CHLORIDE: 106 meq/L (ref 98–109)
CO2: 27 meq/L (ref 22–29)
CREATININE: 1 mg/dL (ref 0.6–1.1)
EGFR: 83 mL/min/{1.73_m2} — AB (ref >90)
Glucose: 96 mg/dl (ref 70–140)
POTASSIUM: 4.4 meq/L (ref 3.5–5.1)
Sodium: 145 mEq/L (ref 136–145)
Total Bilirubin: 0.27 mg/dL (ref 0.20–1.20)
Total Protein: 7.5 g/dL (ref 6.4–8.3)

## 2016-10-16 LAB — DRAW EXTRA CLOT TUBE

## 2016-10-16 NOTE — Progress Notes (Signed)
ID: Tamara [name] OB: 04-20-1971  MR#: [id number]  CSN#:[id number]  PCP: Angelica Chessman, MD GYN:   Tamara: Dr. Erroll Luna OTHER MD: Dr. 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 There was also an 8 mm mass 8 cm from the nipple in the ipsilateral breast, as well as positive lymph nodes. 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 MRI confirmed 2.8 and 1.3 cm mass is as well as lymph node. On the right a 6 mm nodule, biopsied and negative for malignancy. PET scan 2-68-34 had hypermetabolic left breast mass consistent with known neoplasm and FDG positive left axillary lymph nodes,benign-appearing brown fat activity and muscular activity in the  neck and chest but no findings for metastatic disease involving the neck, chest, abdomen, pelvis or bones. Moderate FDG activity in the endometrial canal is thought likely due to secretory phase of ovulation or menses. No mass, uterine fibroids present. CT [facility] 02-19-14 had 3 cm left breast mass, enlarged left axillary lymph nodes are positive and no CT findings for metastatic disease involving the chest,abdomen or pelvis and no evidence of osseous metastatic disease. Mildly enlarged fibroid uterus."   Her subsequent treatment is as detailed below   INTERVAL HISTORY:   Enis returns today for follow up of her estrogen receptor  negative. interval history is significant for her having had an episode of chest pain which she could not interpreted so he took her to the emergency room where she had a pulmonary embolus ruled out. The CT of the chest was otherwise unremarkable.   She now has a part-time job with only 2 clients and she does not have to pick them up so she is more able to keep up with the chores. However she gets no insurance from this and this is a great concern to her.   REVIEW OF SYSTEMS: Jaylei continues to have problems with 13th. She thinks she had an abscess at one point but she took care of that with rinses. She thinks she needs to have some teeth pulled. However she cannot afford a dentist. She sleeps poorly. She can be short of breath sometimes if she coughs a lot, which is seldom. Sometimes she has a rash. She is Ibrance to anxiety and depression. She has hot flashes. A detailed review of systems today was otherwise stable   PAST MEDICAL HISTORY: Past Medical History:  Diagnosis Date  . Anemia   . BRCA1 positive    c.190T>G (p.Cys64Gly) @ [facility]   . Breast cancer ([facility]) 02/01/14   ER-/PR-/Her2+, still receiving chemo 12/21/14, last Herceptin 02-02-15.Left breat cancer.  Marland Kitchen DVT (deep venous thrombosis) (H[facility])    Right Subclavian and IJ.. Lovenox stopped 01-10-15 until after surgery planned 02-08-15.  . Endometriosis   . Fibroid tumor    02-02-15 remains with abdominal pain and some vaginal bleeding due to fibroids.  . History of blood transfusion    last 9'15. due to chemotherapy.  . Hypertension   .  Iron deficiency anemia, unspecified 02/25/2014  . PONV (postoperative nausea and vomiting)    needs scop patch  . Rash 02/22/2015  . Shortness of breath     when Hemoglobin low, now resolved after transfusions 9'15.  . Wears glasses     PAST SURGICAL HISTORY: Past Surgical History:  Procedure Laterality Date  . BILATERAL TOTAL MASTECTOMY WITH AXILLARY LYMPH NODE DISSECTION Bilateral 07/28/2014   Procedure:  BILATERAL TOTAL MASTECTOMY WITH LEFT AXILLARY SENTINEL LYMPH NODE BIOPSY;  Surgeon: Stark Klein, MD;  Location: [place];  Service: General;  Laterality: Bilateral;  . BREAST RECONSTRUCTION Bilateral 03/31/2015   Procedure: BILATERAL BREAST RECONSTRUCTION REVISION WITH BILATERAL LIPOFILLING;  Surgeon: Theodoro Kos, DO;  Location: [place];  Service: Plastics;  Laterality: Bilateral;  . BREAST RECONSTRUCTION WITH PLACEMENT OF TISSUE EXPANDER AND FLEX HD (ACELLULAR HYDRATED DERMIS) Bilateral 07/28/2014   Procedure: BILATERAL BREAST RECONSTRUCTION WITH PLACEMENT OF TISSUE EXPANDER AND FLEX HD (ACELLULAR HYDRATED DERMIS);  Surgeon: Theodoro Kos, DO;  Location: [place];  Service: Plastics;  Laterality: Bilateral;  . CARDIAC CATHETERIZATION     10'15- Dr. Sung Amabile  . CERVICAL POLYPECTOMY  2010  . CESAREAN SECTION     one previous  . LEFT HEART CATHETERIZATION WITH CORONARY ANGIOGRAM N/A 09/13/2014   Procedure: LEFT HEART CATHETERIZATION WITH CORONARY ANGIOGRAM;  Surgeon: Jolaine Artist, MD;  Location: [facility] CATH LAB;  Service: Cardiovascular;  Laterality: N/A;  . LIPOSUCTION WITH LIPOFILLING Bilateral 03/31/2015   Procedure: LIPOSUCTION WITH LIPOFILLING;  Surgeon: Theodoro Kos, DO;  Location: [place];  Service: Plastics;  Laterality: Bilateral;  . PORT-A-CATH REMOVAL N/A 05/21/2014   Procedure: REMOVAL PORT-A-CATH;  Surgeon: Zenovia Jarred, MD;  Location: [place];  Service: General;  Laterality: N/A;  . PORTACATH PLACEMENT Right 02/23/2014   Procedure: INSERTION PORT-A-CATH;  Surgeon: Joyice Faster. Cornett, MD;  Location: [place];  Service: General;  Laterality: Right;  . REMOVAL OF TISSUE EXPANDER AND PLACEMENT OF IMPLANT Bilateral 10/14/2014   Procedure: REMOVAL OF BILATERAL BREAST  TISSUE EXPANDER  WITH PLASCEMENT OF BILATERAL  BREAST IMPLANTS;  Surgeon: Theodoro Kos, DO;  Location: [place];  Service: Plastics;  Laterality: Bilateral;   . ROBOTIC ASSISTED TOTAL HYSTERECTOMY WITH BILATERAL SALPINGO OOPHERECTOMY Bilateral 02/08/2015   Procedure: ROBOTIC ASSISTED TOTAL HYSTERECTOMY WITH BILATERAL SALPINGO OOPHORECTOMY; UTERUS WEIGHING GREATER THAN 250 GRAMS;  Surgeon: Everitt Amber, MD;  Location: [facility];  Service: Gynecology;  Laterality: Bilateral;  BRCA 1 GENE MUTATION  . TEE WITHOUT CARDIOVERSION N/A 05/26/2014   Procedure: TRANSESOPHAGEAL ECHOCARDIOGRAM (TEE);  Surgeon: Larey Dresser, MD;  Location: [place];  Service: Cardiovascular;  Laterality: N/A;  . TUBAL LIGATION      FAMILY HISTORY Family History  Problem Relation Age of Onset  . Breast cancer Mother 31    currently 58  . Diabetes Father   . Pancreatic cancer Father 27  . Breast cancer Paternal Aunt 51    currently 19; BRCA1 positive  . Stroke Maternal Grandfather   . Cancer Paternal Aunt     unk. primary; deceased 93s  . Breast cancer Cousin     daughter of unaffected paternal aunt; dx in her 7s  [name] father died from prostate cancer the age of 23. [name] mother was diagnosed with breast cancer the age of 56. [name] father had 5 sisters, 3 of whom were diagnosed with breast cancer, 2 of them before the age of 57. The patient had one brother, no sisters.  There is no history of ovarian cancer in the family.  GYNECOLOGIC HISTORY:  Menarche age 74, first live birth age 74, the patient is [place] P5. Her periods stopped at the time of chemotherapy. 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 Her (former) husband [name] works as an Animal nutritionist. Their divorce was finalized September 2016. [name] oldest child, a son, [name], is studying Therapist, occupational; the patient is a 51 year old daughter [name] is also in college. [name] younger children are 45, 61, and 90. The patient attends a [employer]   ADVANCED  DIRECTIVES: Not in place; at the 12/15/2015 visit the patient was given the appropriate forms to complete and notarize at her discretion   HEALTH MAINTENANCE: Social History  Substance Use Topics  . Smoking status: Former Smoker    Packs/day: 0.25    Years: 15.00    Quit date: 02/18/2009  . Smokeless tobacco: Former Systems developer  . Alcohol use No     Comment: occasional   Colonoscopy: Bone Density Scan:  Pap Smear:  Eye Exam:  Vitamin D Level:   Lipid Panel:    Allergies  Allergen Reactions  . Compazine [Prochlorperazine Edisylate] Other (See Comments)    Stuttering    Current Outpatient Prescriptions  Medication Sig Dispense Refill  . acetaminophen-codeine (TYLENOL #3) 300-30 MG tablet Take 1 tablet by mouth every 4 (four) hours as needed. 30 tablet 0  . Biotin 5000 MCG TABS Take 10,000 mcg by mouth daily. Reported on 06/21/2016    . buPROPion (WELLBUTRIN) 100 MG tablet Take 1 tablet (100 mg total) by mouth 2 (two) times daily. 60 tablet 3  . carvedilol (COREG) 12.5 MG tablet Take 1.5 tablets (18.75 mg total) by mouth 2 (two) times daily with a meal. (Patient not taking: Reported on 09/20/2016) 90 tablet 3  . cyclobenzaprine (FLEXERIL) 10 MG tablet Take 1 tablet (10 mg total) by mouth 3 (three) times daily as needed for muscle spasms. (Patient not taking: Reported on 09/20/2016) 30 tablet 0  . diphenhydrAMINE (BENADRYL) 25 MG tablet Take 1 tablet (25 mg total) by mouth every 6 (six) hours. (Patient not taking: Reported on 09/20/2016) 20 tablet 0  . ferrous sulfate 325 (65 FE) MG tablet Take 1 tablet (325 mg total) by mouth 3 (three) times daily with meals. 270 tablet 3  . gabapentin (NEURONTIN) 100 MG capsule Take 1 capsule (100 mg total) by mouth 3 (three) times daily. (Patient not taking: Reported on 09/20/2016) 90 capsule 3  . pantoprazole (PROTONIX) 40 MG tablet Take 1 tablet (40 mg total) by mouth daily as needed (for acid reflex). (Patient not taking: Reported on 09/20/2016) 30  tablet 3   No current facility-administered medications for this visit.     OBJECTIVE: Young-appearing African American woman No acute distress Vitals:   10/16/16 1104  BP: 127/81  Pulse: 76  Resp: 18  Temp: 98.3 F (36.8 C)     Body mass index is 36.09 kg/m.      ECOG FS:1 - Symptomatic but completely ambulatory  Sclerae unicteric, EOMs intact Oropharynx clear and moist No cervical or supraclavicular adenopathy Lungs no rales or rhonchi Heart regular rate and rhythm Abd soft, nontender, positive bowel sounds MSK no focal spinal tenderness, no upper extremity lymphedema Neuro: nonfocal, well oriented, appropriate affect Breasts: Status post bilateral mastectomies. There is no evidence of local recurrence. Both axillae are benign.  LAB RESULTS:  CMP     Component Value Date/Time   NA 145 10/16/2016 1043   K 4.4 10/16/2016 1043   CL 107 04/23/2016 1353   CO2 27 10/16/2016 1043   GLUCOSE 96 10/16/2016 1043   BUN 16.3 10/16/2016 1043   CREATININE 1.0 10/16/2016 1043   CALCIUM 9.9 10/16/2016 1043   PROT 7.5 10/16/2016 1043   ALBUMIN 3.9 10/16/2016 1043   AST 17 10/16/2016 1043   ALT 19 10/16/2016 1043   ALKPHOS 110 10/16/2016 1043   BILITOT 0.27 10/16/2016 1043   GFRNONAA >60 04/23/2016 1353   GFRAA >60 04/23/2016 1353    I No results found for: SPEP  Lab Results  Component Value Date   WBC 10.1 10/16/2016   NEUTROABS 6.6 (H) 10/16/2016   HGB 13.3 10/16/2016   HCT 40.6 10/16/2016   MCV 88.4 10/16/2016   PLT 338 10/16/2016        Chemistry      Component Value Date/Time   NA 145 10/16/2016 1043   K 4.4 10/16/2016 1043   CL 107 04/23/2016 1353   CO2 27 10/16/2016 1043   BUN 16.3 10/16/2016 1043   CREATININE 1.0 10/16/2016 1043      Component Value Date/Time   CALCIUM 9.9 10/16/2016 1043   ALKPHOS 110 10/16/2016 1043   AST 17 10/16/2016 1043   ALT 19 10/16/2016 1043   BILITOT 0.27 10/16/2016 1043       No results found for: LABCA2  No  components found for: LABCA125  No results for input(s): INR in the last 168 hours.  Urinalysis    Component Value Date/Time   COLORURINE RED (A) 02/02/2015 1030   APPEARANCEUR CLOUDY (A) 02/02/2015 1030   LABSPEC 1.025 02/16/2015 1243   PHURINE 6.0 02/16/2015 1243   PHURINE 6.0 02/02/2015 1030   GLUCOSEU Negative 02/16/2015 1243   HGBUR Negative 02/16/2015 1243   HGBUR LARGE (A) 02/02/2015 1030   BILIRUBINUR negative 06/21/2016 1120   BILIRUBINUR Negative 02/16/2015 1243   KETONESUR negative 06/21/2016 1120   KETONESUR 5 02/16/2015 1243   KETONESUR NEGATIVE 02/02/2015 1030   PROTEINUR negative 06/21/2016 1120   PROTEINUR < 30 02/16/2015 1243   PROTEINUR 30 (A) 02/02/2015 1030   UROBILINOGEN 0.2 06/21/2016 1120   UROBILINOGEN 0.2 02/16/2015 1243   NITRITE Negative 06/21/2016 1120   NITRITE Negative 02/16/2015 1243   NITRITE NEGATIVE 02/02/2015 1030   LEUKOCYTESUR Negative 06/21/2016 1120   LEUKOCYTESUR Moderate 02/16/2015 1243    STUDIES: Ct Angio Chest W/cm &/or Wo Cm  Result Date: 09/26/2016 CLINICAL DATA:  Chest pain since 2015. History of breast cancer. Shortness of breath. EXAM: CT ANGIOGRAPHY CHEST WITH CONTRAST TECHNIQUE: Multidetector CT imaging of the chest was performed using the standard protocol during bolus administration of intravenous contrast. Multiplanar CT image reconstructions and MIPs were obtained to evaluate the vascular anatomy. CONTRAST:  70 mL Isovue 370 COMPARISON:  04/13/2014, 05/11/2014, 12/04/2014 FINDINGS: Cardiovascular: Satisfactory opacification of the pulmonary arteries to the segmental level. No evidence of pulmonary embolism. Normal heart size. No pericardial effusion. Normal caliber thoracic aorta. Mediastinum/Nodes: No enlarged mediastinal, hilar, or axillary lymph nodes. Thyroid gland, trachea, and esophagus demonstrate no significant findings. Lungs/Pleura: Stable 7 mm left lower lobe pulmonary nodule unchanged compared with 04/13/2014. 6 mm  right middle lobe pulmonary nodule (image 58/series 5) unchanged compared with 12/04/2014. Lungs are otherwise clear. No pleural effusion or pneumothorax. Upper Abdomen: No acute abnormality. Musculoskeletal: No chest wall abnormality. No acute or significant osseous findings.  Other: Bilateral breast implants. Review of the MIP images confirms the above findings. IMPRESSION: 1. No pulmonary embolus. 2. No acute cardiopulmonary disease. 3. 6 mm right middle lobe pulmonary nodule unchanged compared with 12/04/2014. If the patient is at high risk for bronchogenic carcinoma, follow-up chest CT at 6-12 months is recommended. If the patient is at low risk for bronchogenic carcinoma, follow-up chest CT at 12 months is recommended. This recommendation follows the consensus statement: Guidelines for Management of Small Pulmonary Nodules Detected on CT Scans: A Statement from the [place] as published in Radiology 2005;237:395-400. Electronically Signed   By: Kathreen Devoid   On: 09/26/2016 16:00   CLINICAL DATA:  Patient is a BRCA gene mutation carrier. Status post treated left breast cancer, post bilateral mastectomy and implant reconstruction in 2015.  LABS:  None.  EXAM: BILATERAL BREAST MRI WITH AND WITHOUT CONTRAST  TECHNIQUE: Multiplanar, multisequence MR images of both breasts were obtained prior to and following the intravenous administration of 19 ml of MultiHance.  THREE-DIMENSIONAL MR IMAGE RENDERING ON INDEPENDENT WORKSTATION:  Three-dimensional MR images were rendered by post-processing of the original MR data on an independent workstation. The three-dimensional MR images were interpreted, and findings are reported in the following complete MRI report for this study. Three dimensional images were evaluated at the independent DynaCad workstation  COMPARISON:  MRI dated July 07, 2014. Please note that this is patient's first postsurgical MRI and therefore her new  baseline.  FINDINGS: Breast composition: None applicable.  Background parenchymal enhancement: None applicable.  Right breast: No mass or abnormal enhancement. There is an intact subpectoral implant.  Left breast: No mass or abnormal enhancement. There is an intact subpectoral implant. Mild progressive enhancement along the medial border of the implant is likely due to postsurgical changes.  Lymph nodes: No abnormal appearing right-sided lymph nodes. There are two mildly prominent left intramammary lymph nodes, mainly a 7 mm in long-axis lymph node at the second left costochondral junction and an 11 mm in long-axis lymph node at the third left costochondral junction.  Ancillary findings:  None.  IMPRESSION: No MRI evidence of breast malignancy status post bilateral mastectomy and implant reconstruction.  Two sub pathologic by size criteria, however asymmetric left internal mammary chain lymph nodes, with uncertain significance. These may represent reactive or malignant lymph nodes. Short-term follow-up or correlation with PET-CT may be considered.  RECOMMENDATION: Clinical follow-up is recommended.  BI-RADS CATEGORY  2: Benign.   Electronically Signed   By: Fidela Salisbury M.D.   On: 09/06/2016 16:24   Assessment: 45 y.o. BRCA-1 positive [place] woman  1. S/p left breast upper outer quadrant biopsy of two separate breast masses and one axillary lymph node 01/29/2014 for a , clinical mT2 N1 stage IIB, invasive ductal carcinoma, grade 2-3,  estrogen and progesterone receptor negative, with an MIB-1 between 79-100%, and HER 2 amplified  2. completed 6 cycles of carboplatin, docetaxel, trastuzumab and pertuzumab 06/30/2014 with MRI 07/07/2014 showing a complete radiologic response  3. trastuzumab was to be continued to complete a year (through March 2016); however echocardiogram 07/05/2014 showed an ejection fraction of 45-50%-- trastuzumab was held  after 06/30/2014 dose until EF recovery, resumed 11/09/2014  (a) cath report from 09/13/2014 shows normal coronaries and a normal left ventricular function with an ejection fraction of 55%.  (b) echo 03/01/2015 suggest continuing mild cardiomyopathy: Trastuzumab discontinued--final dose 02/22/2015  4. 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   5. MRSA  port infection and septicemia mid-June 2015;  Port was removed, and  PICC line placed, completed antibiotic therapy with ANCEF; PICC subsequently pulled   6 genetic testing with the [facility] panel and was found to have a pathogenic mutation in the BRCA1 gene called c.190T>G (p.Cys64Gly  7. status post bilateral mastectomies and left axillary sentinel lymph node biopsy, withimmediate expander placement, on 07/28/14; the pathology ([id number]) showed a complete pathologic response in the left breast and the 3 sentinel lymph nodes sampled; the right breast was benign  (a) bilateral breast reconstruction revision 03/31/2015  8. Bilateral salpingo-oophorectomy and hysterectomy 02/08/15 with benign pathology  PLAN:  Leliana remains exceedingly anxious. She can't tell if some of the pain she is experiencing are normal after the surgery she had, which largely they are, or if she is having a heart attack or other major medical problem.  You should be reassuring to her that she had a negative CT scan of the chest and a negative MRI as well.  I have asked her to call us first if she has significant symptoms so that we can evaluated here and this may sever sometime in the emergency room.  I will ask our dentist Dr. Enrique Sack whether he could possibly work. At some point to see if she in fact need some extractions as she thinks.  Otherwise she will see our survivorship nurse practitioner in 3 months. She will see me in 6 months. She knows to call for any problems that may develop before those visits.    [name], MD 10/16/2016 5:53 PM

## 2016-11-05 ENCOUNTER — Encounter (HOSPITAL_COMMUNITY): Payer: Self-pay | Admitting: Dentistry

## 2016-11-05 ENCOUNTER — Ambulatory Visit (HOSPITAL_COMMUNITY): Payer: Medicaid - Dental | Admitting: Dentistry

## 2016-11-05 VITALS — BP 145/80 | HR 67 | Temp 98.0°F

## 2016-11-05 DIAGNOSIS — K0530 Chronic periodontitis, unspecified: Secondary | ICD-10-CM

## 2016-11-05 DIAGNOSIS — K0401 Reversible pulpitis: Secondary | ICD-10-CM

## 2016-11-05 DIAGNOSIS — K080 Exfoliation of teeth due to systemic causes: Secondary | ICD-10-CM

## 2016-11-05 DIAGNOSIS — K045 Chronic apical periodontitis: Secondary | ICD-10-CM | POA: Diagnosis not present

## 2016-11-05 DIAGNOSIS — M264 Malocclusion, unspecified: Secondary | ICD-10-CM

## 2016-11-05 DIAGNOSIS — K08409 Partial loss of teeth, unspecified cause, unspecified class: Secondary | ICD-10-CM

## 2016-11-05 DIAGNOSIS — Z171 Estrogen receptor negative status [ER-]: Principal | ICD-10-CM

## 2016-11-05 DIAGNOSIS — K036 Deposits [accretions] on teeth: Secondary | ICD-10-CM | POA: Insufficient documentation

## 2016-11-05 DIAGNOSIS — K083 Retained dental root: Secondary | ICD-10-CM | POA: Insufficient documentation

## 2016-11-05 DIAGNOSIS — K029 Dental caries, unspecified: Secondary | ICD-10-CM

## 2016-11-05 DIAGNOSIS — C50412 Malignant neoplasm of upper-outer quadrant of left female breast: Secondary | ICD-10-CM

## 2016-11-05 NOTE — Addendum Note (Signed)
Addended by: [name] on: 11/05/2016 03:23 PM   Modules accepted: Orders, SmartSet

## 2016-11-05 NOTE — Progress Notes (Signed)
DENTAL CONSULTATION  Date of Consultation:  11/05/2016 Patient Name:   Tamara [name] Date of Birth:   [birth year] Medical Record Number: [id number]  VITALS: BP (!) 145/80 (BP Location: Right Arm)   Pulse 67   Temp 98 F (36.7 C) (Oral)   LMP 02/19/2014 Comment: chemo  CHIEF COMPLAINT: The patient was referred by Dr. Jana Hakim for a dental consultation.   HPI: Tamara [name] is a 45 year old female with history of breast cancer. Patient is status post bilateral mastectomy with reconstruction. Patient is status post chemotherapy.  Patient referred for evaluation of poor dentition.  The patient has a history of acute pulpitis symptoms involving the lower right quadrant. Patient points to tooth #32 as the offending tooth. Patient has a history of sharp, intermittent tooth pain over the past several months. Patient has a history of spontaneous tooth pain. Patient indicates that the pain reached an intensity of 9 out of 10 but is currently 0 out of 10-today. Patient last saw a dentist when she was approximately 45 years old. Patient has no primary dentist. Patient has no partial dentures. Patient denies having dental phobia.  PROBLEM LIST: Patient Active Problem List   Diagnosis Date Noted  . Adjustment disorder with mixed anxiety and depressed mood 09/20/2016  . Left hip pain 07/19/2016  . Urination frequency 06/21/2016  . Low back pain, non-specific 06/21/2016  . Acute deep vein thrombosis (DVT) of proximal end of right lower extremity ([place]) 12/15/2015  . Breast cancer, BRCA1 positive ([place]) 12/15/2015  . Genetic testing 10/10/2015  . Chest pain 08/28/2015  . Precordial chest pain 08/18/2015  . Chemotherapy induced cardiomyopathy ([place]) 03/01/2015  . Rash 02/22/2015  . Long term current use of anticoagulant therapy 02/22/2015  . 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 02/08/2015  . Heavy menstrual bleeding 01/11/2015  . New daily persistent headache 12/16/2014  . Essential hypertension, benign 12/16/2014   . Moderate recurrent major depression ([place]) 12/16/2014  . Cardiomyopathy ([place]) 10/20/2014  . Cardiomyopathy, ischemic 09/06/2014  . Fever 09/02/2014  . Neoplasm related pain 09/02/2014  . Anemia 08/07/2014  . Symptomatic anemia 08/07/2014  . Absence of breast 08/06/2014  . Breast cancer ([place]) 07/28/2014  . Bloodstream infection due to port-a-cath 05/30/2014  . Acute blood loss anemia 05/20/2014  . Staphylococcus aureus bacteremia with sepsis ([place]) 05/19/2014  . Sinus tachycardia 05/19/2014  . AKI (acute kidney injury) ([place]) 05/19/2014  . Palpitations 05/05/2014  . BRCA1 positive   . Local infection due to port-a-cath 03/09/2014  . Iron deficiency anemia 02/25/2014  . HTN (hypertension) 02/19/2014  . Breast cancer of upper-outer quadrant of left female breast ([place]) 02/02/2014    PMH: Past Medical History:  Diagnosis Date  . Anemia   . BRCA1 positive    c.190T>G (p.Cys64Gly) @ [facility]   . Breast cancer ([place]) 02/01/14   ER-/PR-/Her2+, still receiving chemo 12/21/14, last Herceptin 02-02-15.Left breat cancer.  Marland Kitchen DVT (deep venous thrombosis) ([facility])    Right Subclavian and IJ.. Lovenox stopped 01-10-15 until after surgery planned 02-08-15.  . Endometriosis   . Fibroid tumor    02-02-15 remains with abdominal pain and some vaginal bleeding due to fibroids.  . History of blood transfusion    last 9'15. due to chemotherapy.  . Hypertension   . Iron deficiency anemia, unspecified 02/25/2014  . PONV (postoperative nausea and vomiting)    needs scop patch  . Rash 02/22/2015  . Shortness of breath     when Hemoglobin low, now resolved after transfusions 9'15.  . Wears glasses  PSH: Past Surgical History:  Procedure Laterality Date  . BILATERAL TOTAL MASTECTOMY WITH AXILLARY LYMPH NODE DISSECTION Bilateral 07/28/2014   Procedure: BILATERAL TOTAL MASTECTOMY WITH LEFT AXILLARY SENTINEL LYMPH NODE BIOPSY;  Surgeon: Stark Klein, MD;  Location: [place] [facility];  Service: General;  Laterality: Bilateral;   . BREAST RECONSTRUCTION Bilateral 03/31/2015   Procedure: BILATERAL BREAST RECONSTRUCTION REVISION WITH BILATERAL LIPOFILLING;  Surgeon: Theodoro Kos, DO;  Location: [facility];  Service: Plastics;  Laterality: Bilateral;  . BREAST RECONSTRUCTION WITH PLACEMENT OF TISSUE EXPANDER AND FLEX HD (ACELLULAR HYDRATED DERMIS) Bilateral 07/28/2014   Procedure: BILATERAL BREAST RECONSTRUCTION WITH PLACEMENT OF TISSUE EXPANDER AND FLEX HD (ACELLULAR HYDRATED DERMIS);  Surgeon: Theodoro Kos, DO;  Location: [facility];  Service: Plastics;  Laterality: Bilateral;  . CARDIAC CATHETERIZATION     10'15- Dr. Sung Amabile  . CERVICAL POLYPECTOMY  2010  . CESAREAN SECTION     one previous  . LEFT HEART CATHETERIZATION WITH CORONARY ANGIOGRAM N/A 09/13/2014   Procedure: LEFT HEART CATHETERIZATION WITH CORONARY ANGIOGRAM;  Surgeon: Jolaine Artist, MD;  Location: [facility] CATH LAB;  Service: Cardiovascular;  Laterality: N/A;  . LIPOSUCTION WITH LIPOFILLING Bilateral 03/31/2015   Procedure: LIPOSUCTION WITH LIPOFILLING;  Surgeon: Theodoro Kos, DO;  Location: [place];  Service: Plastics;  Laterality: Bilateral;  . PORT-A-CATH REMOVAL N/A 05/21/2014   Procedure: REMOVAL PORT-A-CATH;  Surgeon: Zenovia Jarred, MD;  Location: [facility];  Service: General;  Laterality: N/A;  . PORTACATH PLACEMENT Right 02/23/2014   Procedure: INSERTION PORT-A-CATH;  Surgeon: Joyice Faster. Cornett, MD;  Location: [place];  Service: General;  Laterality: Right;  . REMOVAL OF TISSUE EXPANDER AND PLACEMENT OF IMPLANT Bilateral 10/14/2014   Procedure: REMOVAL OF BILATERAL BREAST  TISSUE EXPANDER  WITH PLASCEMENT OF BILATERAL  BREAST IMPLANTS;  Surgeon: Theodoro Kos, DO;  Location: [place];  Service: Plastics;  Laterality: Bilateral;  . ROBOTIC ASSISTED TOTAL HYSTERECTOMY WITH BILATERAL SALPINGO OOPHERECTOMY Bilateral 02/08/2015   Procedure: ROBOTIC ASSISTED TOTAL HYSTERECTOMY WITH BILATERAL  SALPINGO OOPHORECTOMY; UTERUS WEIGHING GREATER THAN 250 GRAMS;  Surgeon: Everitt Amber, MD;  Location: [facility];  Service: Gynecology;  Laterality: Bilateral;  BRCA 1 GENE MUTATION  . TEE WITHOUT CARDIOVERSION N/A 05/26/2014   Procedure: TRANSESOPHAGEAL ECHOCARDIOGRAM (TEE);  Surgeon: Larey Dresser, MD;  Location: [place];  Service: Cardiovascular;  Laterality: N/A;  . TUBAL LIGATION      ALLERGIES: Allergies  Allergen Reactions  . Compazine [Prochlorperazine Edisylate] Other (See Comments)    Stuttering    MEDICATIONS: Current Outpatient Prescriptions  Medication Sig Dispense Refill  . acetaminophen-codeine (TYLENOL #3) 300-30 MG tablet Take 1 tablet by mouth every 4 (four) hours as needed. 30 tablet 0  . Biotin 5000 MCG TABS Take 10,000 mcg by mouth daily. Reported on 06/21/2016    . buPROPion (WELLBUTRIN) 100 MG tablet Take 1 tablet (100 mg total) by mouth 2 (two) times daily. 60 tablet 3  . ferrous sulfate 325 (65 FE) MG tablet Take 1 tablet (325 mg total) by mouth 3 (three) times daily with meals. 270 tablet 3  . carvedilol (COREG) 12.5 MG tablet Take 1.5 tablets (18.75 mg total) by mouth 2 (two) times daily with a meal. (Patient not taking: Reported on 11/05/2016) 90 tablet 3  . cyclobenzaprine (FLEXERIL) 10 MG tablet Take 1 tablet (10 mg total) by mouth 3 (three) times daily as needed for muscle spasms. (Patient not taking: Reported on 11/05/2016) 30 tablet 0  . diphenhydrAMINE (BENADRYL) 25  MG tablet Take 1 tablet (25 mg total) by mouth every 6 (six) hours. (Patient not taking: Reported on 11/05/2016) 20 tablet 0  . gabapentin (NEURONTIN) 100 MG capsule Take 1 capsule (100 mg total) by mouth 3 (three) times daily. (Patient not taking: Reported on 11/05/2016) 90 capsule 3  . pantoprazole (PROTONIX) 40 MG tablet Take 1 tablet (40 mg total) by mouth daily as needed (for acid reflex). (Patient not taking: Reported on 11/05/2016) 30 tablet 3   No current facility-administered medications for  this visit.     LABS: Lab Results  Component Value Date   WBC 10.1 10/16/2016   HGB 13.3 10/16/2016   HCT 40.6 10/16/2016   MCV 88.4 10/16/2016   PLT 338 10/16/2016      Component Value Date/Time   NA 145 10/16/2016 1043   K 4.4 10/16/2016 1043   CL 107 04/23/2016 1353   CO2 27 10/16/2016 1043   GLUCOSE 96 10/16/2016 1043   BUN 16.3 10/16/2016 1043   CREATININE 1.0 10/16/2016 1043   CALCIUM 9.9 10/16/2016 1043   GFRNONAA >60 04/23/2016 1353   GFRAA >60 04/23/2016 1353   Lab Results  Component Value Date   INR 1.12 09/04/2015   INR 1.08 08/15/2015   INR 1.08 01/09/2015   No results found for: PTT  SOCIAL HISTORY: Social History   Social History  . Marital status: Legally Separated    Spouse name: N/A  . Number of children: 5  . Years of education: N/A   Occupational History  . Not on file.   Social History Main Topics  . Smoking status: Former Smoker    Packs/day: 0.25    Years: 15.00    Quit date: 02/18/2009  . Smokeless tobacco: Never Used  . Alcohol use No     Comment: occasional  . Drug use: No  . Sexual activity: Not Currently    Birth control/ protection: Surgical   Other Topics Concern  . Not on file   Social History Narrative  . No narrative on file    FAMILY HISTORY: Family History  Problem Relation Age of Onset  . Breast cancer Mother 51    currently 56  . Diabetes Father   . Pancreatic cancer Father 80  . Breast cancer Paternal Aunt 26    currently 10; BRCA1 positive  . Stroke Maternal Grandfather   . Cancer Paternal Aunt     unk. primary; deceased 51s  . Breast cancer Cousin     daughter of unaffected paternal aunt; dx in her 31s    REVIEW OF SYSTEMS: Reviewed With the patient has for history of present illness.  Psych: Patient denies having dental phobia.  DENTAL HISTORY: CHIEF COMPLAINT: The patient was referred by Dr. Jana Hakim for a dental consultation.   HPI: Tamara [name] is a 45 year old female with history of  breast cancer. Patient is status post bilateral mastectomy with reconstruction. Patient is status post chemotherapy.  Patient referred for evaluation of poor dentition.  The patient has a history of acute pulpitis symptoms involving the lower right quadrant. Patient points to tooth #32 as the offending tooth. Patient has a history of sharp, intermittent tooth pain over the past several months. Patient has a history of spontaneous tooth pain. Patient indicates that the pain reached an intensity of 9 out of 10 but is currently 0 out of 10-today. Patient last saw a dentist when she was approximately 45 years old. Patient has no primary dentist. Patient has no partial dentures.  Patient denies having dental phobia.  DENTAL EXAMINATION: GENERAL: Patient is a well-developed, well-nourished female in no acute distress. HEAD AND NECK: There is no palpable neck lymphadenopathy. The patient denies acute TMJ symptoms. Patient has nose jewelry involving the left nares. INTRAORAL EXAM: Patient has normal saliva. There is no evidence of oral abscess formation. DENTITION: Patient is missing tooth number 19. There are retained root segments in the area of tooth numbers 2, 18, 30, and 32. PERIODONTAL: Patient has chronic periodontitis with significant plaque and calculus accumulations, selective areas of gingival recession, and mandibular anterior tooth mobility. Periodontal charting was deferred secondary to calculus accumulations. DENTAL CARIES/SUBOPTIMAL RESTORATIONS: Dental caries as per dental charting form. ENDODONTIC: Patient with a history of acute pulpitis symptoms involving tooth #32. There is periapical radiolucency associated with tooth numbers 2, 18, 30, and 32. CROWN AND BRIDGE: There are no crown or bridge restorations PROSTHODONTIC: There are no partial dentures. OCCLUSION: Patient with a poor occlusal scheme and malocclusion secondary to multiple missing teeth, multiple retained root segments, multiple  diastemas, and lack of replacement of all missing teeth with dental prostheses.  RADIOGRAPHIC INTERPRETATION: Orthopantogram was taken and supplemented with a full series of dental radiographs There is a missing tooth #19. There multiple retained root segments. There are multiple diastemas. There is incipient to moderate bone loss noted. There is radiographic calculus noted. There are multiple areas of periapical pathology and radiolucency. There is a piece of metal involving the left nares consistent with nose jewelry.  ASSESSMENTS: 1. History of left breast cancer-status post bilateral mastectomy with reconstruction. 2. History of chemotherapy 3. History of acute pulpitis 4. Chronic apical periodontitis 5. Multiple retained root segments 6. Dental caries 7. Multiple missing teeth 8. Multiple diastemas 9. Supra-eruption and drifting of the unopposed teeth into the edentulous areas 10. Malocclusion 11. Mandibular anterior incipient tooth mobility.   PLAN/RECOMMENDATIONS: 1. I discussed the risks, benefits, and complications of various treatment options with the patient in relationship to her medical and dental conditions and risk for infection. We discussed various treatment options to include no treatment, multiple extractions with alveoloplasty, pre-prosthetic surgery as indicated, periodontal therapy, dental restorations, root canal therapy, crown and bridge therapy, implant therapy, and replacement of missing teeth as indicated. We discussed referral to an oral surgeon, the patient refused this referral. The patient currently wishes to proceed with extraction of tooth numbers 2, 18, 30, and 32 with alveoloplasty and gross debridement of remaining dentition in the operative number general anesthesia. This has been scheduled for Friday, 11/09/2016 at 7:30 AM at [facility].  The patient will then need to follow-up with a primary dentist of her choice for continued periodontal therapy  and discussion of other dental treatment needs as indicated. Patient was instructed to remove her nose jewelry prior to the dental operating procedure this Friday.   2. Discussion of findings with medical team and coordination of future medical and dental care as needed.  I spent in excess of 90 minutes during the conduct of this consultation and >50% of this time involved direct face-to-face encounter for counseling and/or coordination of the patient's care.    [name], DDS

## 2016-11-05 NOTE — Patient Instructions (Signed)
Patient is scheduled for her operating room procedure for multiple dental extractions with alveoloplasty and gross debridement of remaining dentition for this Friday at 7:30 AM at [facility].  Presurgical testing is to contact patient for preoperative evaluation as indicated. Patient was reminded to be nothing by mouth after midnight on the day of the surgery.  Dr. Enrique Sack

## 2016-11-07 ENCOUNTER — Telehealth (HOSPITAL_COMMUNITY): Payer: Self-pay | Admitting: *Deleted

## 2016-11-07 NOTE — Telephone Encounter (Signed)
Pt needs clearance for multiple teeth extraction with alveoloplasty and debridement.   Will send to Dr Haroldine Laws for clearance, pt is sch for [facility] 12/8

## 2016-11-08 ENCOUNTER — Encounter (HOSPITAL_COMMUNITY): Payer: Self-pay

## 2016-11-08 ENCOUNTER — Encounter: Payer: Self-pay | Admitting: Internal Medicine

## 2016-11-08 ENCOUNTER — Encounter (HOSPITAL_COMMUNITY)
Admission: RE | Admit: 2016-11-08 | Discharge: 2016-11-08 | Disposition: A | Discharge status: Home / Self Care | Payer: Medicaid Other | Source: Ambulatory Visit | Attending: Dentistry | Admitting: Dentistry

## 2016-11-08 DIAGNOSIS — Z01812 Encounter for preprocedural laboratory examination: Secondary | ICD-10-CM | POA: Insufficient documentation

## 2016-11-08 HISTORY — DX: Personal history of other infectious and parasitic diseases: Z86.19

## 2016-11-08 HISTORY — DX: Gastro-esophageal reflux disease without esophagitis: K21.9

## 2016-11-08 HISTORY — DX: Personal history of antineoplastic chemotherapy: Z92.21

## 2016-11-08 HISTORY — DX: Depression, unspecified: F32.A

## 2016-11-08 HISTORY — DX: Chest pain, unspecified: R07.9

## 2016-11-08 HISTORY — DX: Major depressive disorder, single episode, unspecified: F32.9

## 2016-11-08 HISTORY — DX: Cardiac arrhythmia, unspecified: I49.9

## 2016-11-08 LAB — SURGICAL PCR SCREEN
MRSA, PCR: NEGATIVE
STAPHYLOCOCCUS AUREUS: NEGATIVE

## 2016-11-08 NOTE — Patient Instructions (Signed)
Tamara [name]  11/08/2016   Your procedure is scheduled on: Friday November 09, 2016  Report to [facility]  Entrance take [place]  elevators to 3rd floor to  [place] at 5:30 AM.  Call this number if you have problems the morning of surgery [phone]   Remember: ONLY 1 PERSON MAY GO WITH YOU TO SHORT STAY TO GET  READY MORNING OF [place].  Do not eat food or drink liquids :After Midnight.     Take these medicines the morning of surgery with A SIP OF WATER: Bupropion (Wellbutrin); Pantoprazole if needed                                 You may not have any metal on your body including hair pins and              piercings  Do not wear jewelry, make-up, lotions, powders or perfumes, deodorant             Do not wear nail polish.  Do not shave  48 hours prior to surgery.     Do not bring valuables to the hospital. [place].  Contacts, dentures or bridgework may not be worn into surgery.      Patients discharged the day of surgery will not be allowed to drive home.  Name and phone number of your driver:Tamara [name] (daughters friend)  _____________________________________________________________________             [facility] - Preparing for Surgery Before surgery, you can play an important role.  Because skin is not sterile, your skin needs to be as free of germs as possible.  You can reduce the number of germs on your skin by washing with CHG (chlorahexidine gluconate) soap before surgery.  CHG is an antiseptic cleaner which kills germs and bonds with the skin to continue killing germs even after washing. Please DO NOT use if you have an allergy to CHG or antibacterial soaps.  If your skin becomes reddened/irritated stop using the CHG and inform your nurse when you arrive at Short Stay. Do not shave (including legs and underarms) for at least 48 hours prior to the first CHG shower.  You may  shave your face/neck. Please follow these instructions carefully:  1.  Shower with CHG Soap the night before surgery and the  morning of Surgery.  2.  If you choose to wash your hair, wash your hair first as usual with your  normal  shampoo.  3.  After you shampoo, rinse your hair and body thoroughly to remove the  shampoo.                           4.  Use CHG as you would any other liquid soap.  You can apply chg directly  to the skin and wash                       Gently with a scrungie or clean washcloth.  5.  Apply the CHG Soap to your body ONLY FROM THE NECK DOWN.   Do not use on face/ open  Wound or open sores. Avoid contact with eyes, ears mouth and genitals (private parts).                       Wash face,  Genitals (private parts) with your normal soap.             6.  Wash thoroughly, paying special attention to the area where your surgery  will be performed.  7.  Thoroughly rinse your body with warm water from the neck down.  8.  DO NOT shower/wash with your normal soap after using and rinsing off  the CHG Soap.                9.  Pat yourself dry with a clean towel.            10.  Wear clean pajamas.            11.  Place clean sheets on your bed the night of your first shower and do not  sleep with pets. Day of Surgery : Do not apply any lotions/deodorants the morning of surgery.  Please wear clean clothes to the hospital/surgery center.  FAILURE TO FOLLOW THESE INSTRUCTIONS MAY RESULT IN THE CANCELLATION OF YOUR SURGERY PATIENT SIGNATURE_________________________________  NURSE SIGNATURE__________________________________  ________________________________________________________________________

## 2016-11-08 NOTE — Progress Notes (Signed)
Last EF 45-50%. Overall stable from cardiac standpoint.   Likely low-risk for peri-operative cardiac complications. Can proceed without further cardiac testing.   Bensimhon, Daniel,MD 4:10 PM

## 2016-11-08 NOTE — Progress Notes (Signed)
Dr Massagee/anesthesia reviewed pts H&P, ECHO report 01/10/2016. No orders given. Anesthesia to see pt day of surgery.

## 2016-11-09 ENCOUNTER — Ambulatory Visit (HOSPITAL_COMMUNITY)
Admission: RE | Admit: 2016-11-09 | Discharge: 2016-11-09 | Disposition: A | Discharge status: Home / Self Care | Payer: Medicaid Other | Source: Ambulatory Visit | Attending: Dentistry | Admitting: Dentistry

## 2016-11-09 ENCOUNTER — Ambulatory Visit (HOSPITAL_COMMUNITY): Payer: Medicaid Other | Admitting: Anesthesiology

## 2016-11-09 ENCOUNTER — Encounter (HOSPITAL_COMMUNITY)
Admission: RE | Disposition: A | Discharge status: Home / Self Care | Payer: Self-pay | Source: Ambulatory Visit | Attending: Dentistry

## 2016-11-09 ENCOUNTER — Encounter (HOSPITAL_COMMUNITY): Payer: Self-pay | Admitting: *Deleted

## 2016-11-09 DIAGNOSIS — D649 Anemia, unspecified: Secondary | ICD-10-CM | POA: Insufficient documentation

## 2016-11-09 DIAGNOSIS — Z86718 Personal history of other venous thrombosis and embolism: Secondary | ICD-10-CM | POA: Diagnosis not present

## 2016-11-09 DIAGNOSIS — Z800 Family history of malignant neoplasm of digestive organs: Secondary | ICD-10-CM | POA: Insufficient documentation

## 2016-11-09 DIAGNOSIS — K029 Dental caries, unspecified: Secondary | ICD-10-CM | POA: Diagnosis not present

## 2016-11-09 DIAGNOSIS — Z833 Family history of diabetes mellitus: Secondary | ICD-10-CM | POA: Diagnosis not present

## 2016-11-09 DIAGNOSIS — Z823 Family history of stroke: Secondary | ICD-10-CM | POA: Insufficient documentation

## 2016-11-09 DIAGNOSIS — K036 Deposits [accretions] on teeth: Secondary | ICD-10-CM

## 2016-11-09 DIAGNOSIS — I739 Peripheral vascular disease, unspecified: Secondary | ICD-10-CM | POA: Diagnosis not present

## 2016-11-09 DIAGNOSIS — K219 Gastro-esophageal reflux disease without esophagitis: Secondary | ICD-10-CM | POA: Insufficient documentation

## 2016-11-09 DIAGNOSIS — K045 Chronic apical periodontitis: Secondary | ICD-10-CM | POA: Diagnosis not present

## 2016-11-09 DIAGNOSIS — I10 Essential (primary) hypertension: Secondary | ICD-10-CM | POA: Insufficient documentation

## 2016-11-09 DIAGNOSIS — Z888 Allergy status to other drugs, medicaments and biological substances status: Secondary | ICD-10-CM | POA: Insufficient documentation

## 2016-11-09 DIAGNOSIS — M264 Malocclusion, unspecified: Secondary | ICD-10-CM | POA: Diagnosis not present

## 2016-11-09 DIAGNOSIS — K083 Retained dental root: Secondary | ICD-10-CM

## 2016-11-09 DIAGNOSIS — K0530 Chronic periodontitis, unspecified: Secondary | ICD-10-CM

## 2016-11-09 DIAGNOSIS — Z171 Estrogen receptor negative status [ER-]: Secondary | ICD-10-CM

## 2016-11-09 DIAGNOSIS — Z853 Personal history of malignant neoplasm of breast: Secondary | ICD-10-CM | POA: Insufficient documentation

## 2016-11-09 DIAGNOSIS — Z87891 Personal history of nicotine dependence: Secondary | ICD-10-CM | POA: Insufficient documentation

## 2016-11-09 DIAGNOSIS — F329 Major depressive disorder, single episode, unspecified: Secondary | ICD-10-CM | POA: Insufficient documentation

## 2016-11-09 DIAGNOSIS — Z803 Family history of malignant neoplasm of breast: Secondary | ICD-10-CM | POA: Diagnosis not present

## 2016-11-09 DIAGNOSIS — Z9013 Acquired absence of bilateral breasts and nipples: Secondary | ICD-10-CM | POA: Diagnosis not present

## 2016-11-09 DIAGNOSIS — C50412 Malignant neoplasm of upper-outer quadrant of left female breast: Secondary | ICD-10-CM

## 2016-11-09 DIAGNOSIS — K0401 Reversible pulpitis: Secondary | ICD-10-CM

## 2016-11-09 HISTORY — PX: MULTIPLE EXTRACTIONS WITH ALVEOLOPLASTY: SHX5342

## 2016-11-09 SURGERY — MULTIPLE EXTRACTION WITH ALVEOLOPLASTY
Anesthesia: General

## 2016-11-09 MED ORDER — MIDAZOLAM HCL 5 MG/5ML IJ SOLN
INTRAMUSCULAR | Status: DC | PRN
  Administered 2016-11-09: 2 mg via INTRAVENOUS

## 2016-11-09 MED ORDER — FENTANYL CITRATE (PF) 100 MCG/2ML IJ SOLN
INTRAMUSCULAR | Status: AC
  Filled 2016-11-09: qty 2

## 2016-11-09 MED ORDER — SUGAMMADEX SODIUM 200 MG/2ML IV SOLN
INTRAVENOUS | Status: DC | PRN
  Administered 2016-11-09: 200 mg via INTRAVENOUS

## 2016-11-09 MED ORDER — SUGAMMADEX SODIUM 200 MG/2ML IV SOLN
INTRAVENOUS | Status: AC
  Filled 2016-11-09: qty 2

## 2016-11-09 MED ORDER — OXYCODONE HCL 5 MG/5ML PO SOLN: 5.0000 mg | Freq: Once | ORAL | Status: AC | PRN

## 2016-11-09 MED ORDER — BUPIVACAINE-EPINEPHRINE (PF) 0.5% -1:200000 IJ SOLN
INTRAMUSCULAR | Status: AC
  Filled 2016-11-09: qty 3.6

## 2016-11-09 MED ORDER — FENTANYL CITRATE (PF) 100 MCG/2ML IJ SOLN
INTRAMUSCULAR | Status: DC | PRN
  Administered 2016-11-09 (×2): 50 ug via INTRAVENOUS
  Administered 2016-11-09: 100 ug via INTRAVENOUS

## 2016-11-09 MED ORDER — CEFAZOLIN SODIUM-DEXTROSE 2-4 GM/100ML-% IV SOLN
INTRAVENOUS | Status: AC
  Filled 2016-11-09: qty 100

## 2016-11-09 MED ORDER — PROPOFOL 10 MG/ML IV BOLUS
INTRAVENOUS | Status: AC
  Filled 2016-11-09: qty 40

## 2016-11-09 MED ORDER — LIDOCAINE-EPINEPHRINE 2 %-1:100000 IJ SOLN
INTRAMUSCULAR | Status: AC
  Filled 2016-11-09: qty 6.8

## 2016-11-09 MED ORDER — MIDAZOLAM HCL 2 MG/2ML IJ SOLN
INTRAMUSCULAR | Status: AC
  Filled 2016-11-09: qty 2

## 2016-11-09 MED ORDER — ROCURONIUM BROMIDE 50 MG/5ML IV SOSY
PREFILLED_SYRINGE | INTRAVENOUS | Status: AC
  Filled 2016-11-09: qty 5

## 2016-11-09 MED ORDER — PROPOFOL 10 MG/ML IV BOLUS
INTRAVENOUS | Status: DC | PRN
  Administered 2016-11-09: 200 mg via INTRAVENOUS

## 2016-11-09 MED ORDER — BUPIVACAINE-EPINEPHRINE (PF) 0.5% -1:200000 IJ SOLN
INTRAMUSCULAR | Status: DC | PRN
  Administered 2016-11-09: 3.6 mL

## 2016-11-09 MED ORDER — HYDROMORPHONE HCL 1 MG/ML IJ SOLN: 0.2500 mg | INTRAMUSCULAR | Status: DC | PRN

## 2016-11-09 MED ORDER — LIDOCAINE 2% (20 MG/ML) 5 ML SYRINGE
INTRAMUSCULAR | Status: AC
  Filled 2016-11-09: qty 5

## 2016-11-09 MED ORDER — MEPERIDINE HCL 50 MG/ML IJ SOLN: 6.2500 mg | INTRAMUSCULAR | Status: DC | PRN

## 2016-11-09 MED ORDER — SCOPOLAMINE 1 MG/3DAYS TD PT72
MEDICATED_PATCH | TRANSDERMAL | Status: AC
  Filled 2016-11-09: qty 1

## 2016-11-09 MED ORDER — ONDANSETRON HCL 4 MG/2ML IJ SOLN
INTRAMUSCULAR | Status: DC | PRN
  Administered 2016-11-09: 4 mg via INTRAVENOUS

## 2016-11-09 MED ORDER — LACTATED RINGERS IV SOLN
INTRAVENOUS | Status: DC | PRN
  Administered 2016-11-09: 06:00:00 via INTRAVENOUS

## 2016-11-09 MED ORDER — DEXAMETHASONE SODIUM PHOSPHATE 10 MG/ML IJ SOLN
INTRAMUSCULAR | Status: DC | PRN
Start: 2016-11-09 — End: 2016-11-09
  Administered 2016-11-09: 10 mg via INTRAVENOUS

## 2016-11-09 MED ORDER — SUCCINYLCHOLINE CHLORIDE 200 MG/10ML IV SOSY
PREFILLED_SYRINGE | INTRAVENOUS | Status: AC
  Filled 2016-11-09: qty 10

## 2016-11-09 MED ORDER — LIDOCAINE-EPINEPHRINE 2 %-1:100000 IJ SOLN
INTRAMUSCULAR | Status: DC | PRN
  Administered 2016-11-09: 1.4 mL

## 2016-11-09 MED ORDER — CEFAZOLIN SODIUM-DEXTROSE 2-4 GM/100ML-% IV SOLN
2.0000 g | Freq: Once | INTRAVENOUS | Status: AC
  Administered 2016-11-09: 2 g via INTRAVENOUS
  Filled 2016-11-09: qty 100

## 2016-11-09 MED ORDER — LIDOCAINE 2% (20 MG/ML) 5 ML SYRINGE
INTRAMUSCULAR | Status: DC | PRN
  Administered 2016-11-09: 100 mg via INTRAVENOUS

## 2016-11-09 MED ORDER — DEXAMETHASONE SODIUM PHOSPHATE 10 MG/ML IJ SOLN
INTRAMUSCULAR | Status: AC
  Filled 2016-11-09: qty 1

## 2016-11-09 MED ORDER — ROCURONIUM BROMIDE 10 MG/ML (PF) SYRINGE
PREFILLED_SYRINGE | INTRAVENOUS | Status: DC | PRN
  Administered 2016-11-09: 50 mg via INTRAVENOUS

## 2016-11-09 MED ORDER — PROMETHAZINE HCL 25 MG/ML IJ SOLN: 6.2500 mg | INTRAMUSCULAR | Status: DC | PRN

## 2016-11-09 MED ORDER — OXYCODONE-ACETAMINOPHEN 5-325 MG PO TABS: 1.0000 | ORAL_TABLET | Freq: Four times a day (QID) | ORAL | 0 refills | Status: DC | PRN

## 2016-11-09 MED ORDER — OXYCODONE HCL 5 MG PO TABS
5.0000 mg | ORAL_TABLET | Freq: Once | ORAL | Status: AC | PRN
  Administered 2016-11-09: 5 mg via ORAL
  Filled 2016-11-09: qty 1

## 2016-11-09 MED ORDER — ONDANSETRON HCL 4 MG/2ML IJ SOLN
INTRAMUSCULAR | Status: AC
  Filled 2016-11-09: qty 2

## 2016-11-09 SURGICAL SUPPLY — 32 items
ATTRACTOMAT 16X20 MAGNETIC DRP (DRAPES) ×3 IMPLANT
BAG ZIPLOCK 12X15 (MISCELLANEOUS) IMPLANT
BANDAGE EYE OVAL (MISCELLANEOUS) ×6 IMPLANT
BLADE SURG 15 STRL LF DISP TIS (BLADE) ×2 IMPLANT
BLADE SURG 15 STRL SS (BLADE) ×4
CANNULA VESSEL W/WING WO/VALVE (CANNULA) ×3 IMPLANT
GAUZE SPONGE 4X4 12PLY STRL (GAUZE/BANDAGES/DRESSINGS) ×3 IMPLANT
GAUZE SPONGE 4X4 16PLY XRAY LF (GAUZE/BANDAGES/DRESSINGS) ×3 IMPLANT
GLOVE BIOGEL PI IND STRL 6 (GLOVE) ×1 IMPLANT
GLOVE BIOGEL PI INDICATOR 6 (GLOVE) ×2
GLOVE SURG ORTHO 8.0 STRL STRW (GLOVE) ×3 IMPLANT
GLOVE SURG SS PI 6.0 STRL IVOR (GLOVE) ×3 IMPLANT
GOWN STRL REUS W/TWL 2XL LVL3 (GOWN DISPOSABLE) ×3 IMPLANT
GOWN STRL REUS W/TWL LRG LVL3 (GOWN DISPOSABLE) ×3 IMPLANT
KIT BASIN OR (CUSTOM PROCEDURE TRAY) ×3 IMPLANT
NEEDLE DENTAL RB 25GX1.25 (NEEDLE) IMPLANT
NS IRRIG 1000ML POUR BTL (IV SOLUTION) ×3 IMPLANT
PACK EENT SPLIT (PACKS) ×3 IMPLANT
PACKING VAGINAL (PACKING) ×3 IMPLANT
SPONGE SURGIFOAM ABS GEL 100 (HEMOSTASIS) ×3 IMPLANT
SPONGE SURGIFOAM ABS GEL 12-7 (HEMOSTASIS) IMPLANT
SUCTION FRAZIER HANDLE 12FR (TUBING) ×2
SUCTION TUBE FRAZIER 12FR DISP (TUBING) ×1 IMPLANT
SUT CHROMIC 3 0 PS 2 (SUTURE) ×9 IMPLANT
SUT CHROMIC 4 0 P 3 18 (SUTURE) IMPLANT
SYR 50ML LL SCALE MARK (SYRINGE) ×3 IMPLANT
TOWEL OR 17X26 10 PK STRL BLUE (TOWEL DISPOSABLE) ×3 IMPLANT
TUBING CONNECTING 10 (TUBING) ×2 IMPLANT
TUBING CONNECTING 10' (TUBING) ×1
WATER STERILE IRR 1500ML POUR (IV SOLUTION) ×3 IMPLANT
WATER TABLETS ICX (MISCELLANEOUS) ×3 IMPLANT
YANKAUER SUCT BULB TIP NO VENT (SUCTIONS) ×3 IMPLANT

## 2016-11-09 NOTE — Anesthesia Preprocedure Evaluation (Signed)
Anesthesia Evaluation  Patient identified by MRN, date of birth, ID band Patient awake    Reviewed: Allergy & Precautions, NPO status , Patient's Chart, lab work & pertinent test results  History of Anesthesia Complications (+) PONV  Airway Mallampati: I  TM Distance: >3 FB Neck ROM: Full    Dental  (+) Teeth Intact, Dental Advisory Given   Pulmonary shortness of breath, former smoker,    breath sounds clear to auscultation       Cardiovascular hypertension, Pt. on medications and Pt. on home beta blockers + Peripheral Vascular Disease  + dysrhythmias  Rhythm:Regular Rate:Normal     Neuro/Psych  Headaches, PSYCHIATRIC DISORDERS Depression    GI/Hepatic GERD  ,  Endo/Other    Renal/GU Renal disease     Musculoskeletal   Abdominal   Peds  Hematology  (+) anemia ,   Anesthesia Other Findings   Reproductive/Obstetrics                             Anesthesia Physical  Anesthesia Plan  ASA: III  Anesthesia Plan: General   Post-op Pain Management:    Induction: Intravenous  Airway Management Planned: Nasal ETT  Additional Equipment:   Intra-op Plan:   Post-operative Plan: Extubation in OR  Informed Consent: I have reviewed the patients History and Physical, chart, labs and discussed the procedure including the risks, benefits and alternatives for the proposed anesthesia with the patient or authorized representative who has indicated his/her understanding and acceptance.   Dental advisory given  Plan Discussed with: CRNA, Anesthesiologist and Surgeon  Anesthesia Plan Comments:                                          Anesthesia Evaluation  Patient identified by MRN, date of birth, ID band Patient awake    Reviewed: Allergy & Precautions, NPO status , Patient's Chart, lab work & pertinent test results  History of Anesthesia Complications Negative for: history of  anesthetic complications  Airway Mallampati: I  TM Distance: >3 FB Neck ROM: Full    Dental  (+) Teeth Intact   Pulmonary neg shortness of breath, neg sleep apnea, neg COPDneg recent URI, former smoker,  breath sounds clear to auscultation        Cardiovascular hypertension, Pt. on medications and Pt. on home beta blockers - angina- Past MI and - CHF Rhythm:Regular     Neuro/Psych  Headaches, PSYCHIATRIC DISORDERS Depression    GI/Hepatic negative GI ROS, Neg liver ROS,   Endo/Other  Morbid obesity  Renal/GU negative Renal ROS     Musculoskeletal   Abdominal   Peds  Hematology  (+) anemia ,   Anesthesia Other Findings   Reproductive/Obstetrics                             Anesthesia Physical Anesthesia Plan  ASA: III  Anesthesia Plan: General   Post-op Pain Management:    Induction: Intravenous  Airway Management Planned: Oral ETT  Additional Equipment: None  Intra-op Plan:   Post-operative Plan: Extubation in OR  Informed Consent: I have reviewed the patients History and Physical, chart, labs and discussed the procedure including the risks, benefits and alternatives for the proposed anesthesia with the patient or authorized representative who has indicated his/her understanding and acceptance.  Dental advisory given  Plan Discussed with: CRNA and Surgeon  Anesthesia Plan Comments:         Anesthesia Quick Evaluation  Anesthesia Quick Evaluation

## 2016-11-09 NOTE — Anesthesia Postprocedure Evaluation (Signed)
Anesthesia Post Note  Patient: Tamara [name]  Procedure(s) Performed: Procedure(s) (LRB): Extraction of tooth #'s 1,2,18,30, and 32 with alveoloplasty and gross debridement of remaining teeth. (N/A)  Patient location during evaluation: PACU Anesthesia Type: General Level of consciousness: sedated and patient cooperative Pain management: pain level controlled Vital Signs Assessment: post-procedure vital signs reviewed and stable Respiratory status: spontaneous breathing Cardiovascular status: stable Anesthetic complications: no    Last Vitals:  Vitals:   11/09/16 0945 11/09/16 0958  BP: 131/87 122/85  Pulse: 65 67  Resp: 19 16  Temp: 36.6 C 36.4 C    Last Pain:  Vitals:   11/09/16 0945  TempSrc:   PainSc: 0-No pain                 Nolon Nations

## 2016-11-09 NOTE — Transfer of Care (Signed)
Immediate Anesthesia Transfer of Care Note  Patient: Tamara [name]  Procedure(s) Performed: Procedure(s): Extraction of tooth #'s 1,2,18,30, and 32 with alveoloplasty and gross debridement of remaining teeth. (N/A)  Patient Location: PACU  Anesthesia Type:General  Level of Consciousness:  sedated, patient cooperative and responds to stimulation  Airway & Oxygen Therapy:Patient Spontanous Breathing and Patient connected to face mask oxgen  Post-op Assessment:  Report given to PACU RN and Post -op Vital signs reviewed and stable  Post vital signs:  Reviewed and stable  Last Vitals:  Vitals:   11/09/16 0535 11/09/16 0903  BP: 122/81 131/77  Pulse: 62 77  Resp: 20 (P) 18  Temp: 36.8 C (P) [id number] C    Complications: No apparent anesthesia complications

## 2016-11-09 NOTE — H&P (Signed)
11/09/2016  Patient:            Tamara [name] Date of Birth:  October 14, 1971 MRN:                [id number]   BP 122/81   Pulse 62   Temp 98.2 F (36.8 C) (Oral)   Resp 20   Ht _0  (1.651 m)   Wt 209 lb 3.2 oz (94.9 kg)   LMP 02/19/2014 Comment: chemo  SpO2 98%   BMI 34.81 kg/m    Tamara [name] is a 45 year old female with history of breast cancer. Patient now presents for multiple dental extractions with alveoloplasty as well as a gross debridement of remaining dentition in the operating room with general anesthesia.  Patient denies any acute medical or dental changes. Dr. Haroldine [name] has indicated that patient is cleared from a cardiac standpoint without further testing. Please see Dr. Virgie [name] note of 10/16/2016 to act as H&P for the dental operating procedure.  Tamara [name], DDS  Progress Notes Encounter Date: 10/16/2016 11:00 AM Tamara Cruel, MD  Hematology  Expand All Collapse All   _1 Hide copied text  ID: Tamara [name] OB: 06/10/1971  MR#: [id number]  DTO#:[id number]  PCP: Angelica Chessman, MD GYN:   SU: Dr. Erroll Luna OTHER MD: Dr. 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 There was also an 8 mm mass 8 cm from the nipple in the ipsilateral breast, as well as positive lymph nodes. 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  MRI confirmed 2.8 and 1.3 cm mass is as well as lymph node. On the right a 6 mm nodule, biopsied and negative for malignancy. PET scan 9-83-38 had hypermetabolic left breast mass consistent with known neoplasm and FDG positive left axillary lymph nodes,benign-appearing brown fat activity and muscular activity in the  neck and chest but no findings for metastatic disease involving the neck, chest, abdomen, pelvis or bones. Moderate FDG activity in the endometrial canal is thought likely due to secretory phase of ovulation or menses. No mass, uterine fibroids present. CT CAP 02-19-14 had 3 cm left breast mass, enlarged left axillary lymph nodes are positive and no CT findings for metastatic disease involving the chest,abdomen or pelvis and no evidence of osseous metastatic disease. Mildly enlarged fibroid uterus."   Her subsequent treatment is as detailed below   INTERVAL HISTORY:   Tamara [name] returns today for follow up of her estrogen receptor negative. interval history is significant for her having had an episode of chest pain which she could not interpreted so he took her to the emergency room where she had a pulmonary embolus ruled out. The CT of the chest was otherwise unremarkable.   She now has a part-time job with only 2 clients and she does not have to pick them up so she is more able to keep up with the chores. However she gets no insurance from this and this is a great concern  to her.   REVIEW OF SYSTEMS: Tamara [name] continues to have problems with 13th. She thinks she had an abscess at one point but she took care of that with rinses. She thinks she needs to have some teeth pulled. However she cannot afford a dentist. She sleeps poorly. She can be short of breath sometimes if she coughs a lot, which is seldom. Sometimes she has a rash. She is Ibrance to anxiety and depression. She has hot flashes. A detailed review of systems today was otherwise stable   PAST MEDICAL HISTORY:     Past Medical  History:  Diagnosis Date  . Anemia   . BRCA1 positive    c.190T>G (p.Cys64Gly) @ [facility]   . Breast cancer ([place]) 02/01/14   ER-/PR-/Her2+, still receiving chemo 12/21/14, last Herceptin 02-02-15.Left breat cancer.  Marland Kitchen DVT (deep venous thrombosis) ([facility])    Right Subclavian and IJ.. Lovenox stopped 01-10-15 until after surgery planned 02-08-15.  . Endometriosis   . Fibroid tumor    02-02-15 remains with abdominal pain and some vaginal bleeding due to fibroids.  . History of blood transfusion    last 9'15. due to chemotherapy.  . Hypertension   . Iron deficiency anemia, unspecified 02/25/2014  . PONV (postoperative nausea and vomiting)    needs scop patch  . Rash 02/22/2015  . Shortness of breath     when Hemoglobin low, now resolved after transfusions 9'15.  . Wears glasses     PAST SURGICAL HISTORY:      Past Surgical History:  Procedure Laterality Date  . BILATERAL TOTAL MASTECTOMY WITH AXILLARY LYMPH NODE DISSECTION Bilateral 07/28/2014   Procedure: BILATERAL TOTAL MASTECTOMY WITH LEFT AXILLARY SENTINEL LYMPH NODE BIOPSY;  Surgeon: Stark Klein, MD;  Location: [place];  Service: General;  Laterality: Bilateral;  . BREAST RECONSTRUCTION Bilateral 03/31/2015   Procedure: BILATERAL BREAST RECONSTRUCTION REVISION WITH BILATERAL LIPOFILLING;  Surgeon: Theodoro Kos, DO;  Location: [place];  Service: Plastics;  Laterality: Bilateral;  . BREAST RECONSTRUCTION WITH PLACEMENT OF TISSUE EXPANDER AND FLEX HD (ACELLULAR HYDRATED DERMIS) Bilateral 07/28/2014   Procedure: BILATERAL BREAST RECONSTRUCTION WITH PLACEMENT OF TISSUE EXPANDER AND FLEX HD (ACELLULAR HYDRATED DERMIS);  Surgeon: Theodoro Kos, DO;  Location: [place];  Service: Plastics;  Laterality: Bilateral;  . CARDIAC CATHETERIZATION     10'15- Dr. Sung Amabile  . CERVICAL POLYPECTOMY  2010  . CESAREAN SECTION     one previous  . LEFT HEART CATHETERIZATION WITH CORONARY ANGIOGRAM N/A 09/13/2014    Procedure: LEFT HEART CATHETERIZATION WITH CORONARY ANGIOGRAM;  Surgeon: Jolaine Artist, MD;  Location: [facility] CATH LAB;  Service: Cardiovascular;  Laterality: N/A;  . LIPOSUCTION WITH LIPOFILLING Bilateral 03/31/2015   Procedure: LIPOSUCTION WITH LIPOFILLING;  Surgeon: Theodoro Kos, DO;  Location: [place];  Service: Plastics;  Laterality: Bilateral;  . PORT-A-CATH REMOVAL N/A 05/21/2014   Procedure: REMOVAL PORT-A-CATH;  Surgeon: Zenovia Jarred, MD;  Location: [place];  Service: General;  Laterality: N/A;  . PORTACATH PLACEMENT Right 02/23/2014   Procedure: INSERTION PORT-A-CATH;  Surgeon: Joyice Faster. Cornett, MD;  Location: [place];  Service: General;  Laterality: Right;  . REMOVAL OF TISSUE EXPANDER AND PLACEMENT OF IMPLANT Bilateral 10/14/2014   Procedure: REMOVAL OF BILATERAL BREAST  TISSUE EXPANDER  WITH PLASCEMENT OF BILATERAL  BREAST IMPLANTS;  Surgeon: Theodoro Kos, DO;  Location: [place];  Service: Plastics;  Laterality: Bilateral;  . ROBOTIC ASSISTED TOTAL HYSTERECTOMY WITH BILATERAL SALPINGO OOPHERECTOMY Bilateral 02/08/2015   Procedure:  ROBOTIC ASSISTED TOTAL HYSTERECTOMY WITH BILATERAL SALPINGO OOPHORECTOMY; UTERUS WEIGHING GREATER THAN 250 GRAMS;  Surgeon: Everitt Amber, MD;  Location: [facility];  Service: Gynecology;  Laterality: Bilateral;  BRCA 1 GENE MUTATION  . TEE WITHOUT CARDIOVERSION N/A 05/26/2014   Procedure: TRANSESOPHAGEAL ECHOCARDIOGRAM (TEE);  Surgeon: Larey Dresser, MD;  Location: [place];  Service: Cardiovascular;  Laterality: N/A;  . TUBAL LIGATION      FAMILY HISTORY       Family History  Problem Relation Age of Onset  . Breast cancer Mother 70    currently 28  . Diabetes Father   . Pancreatic cancer Father 35  . Breast cancer Paternal Aunt 84    currently 2; BRCA1 positive  . Stroke Maternal Grandfather   . Cancer Paternal Aunt     unk. primary; deceased 41s  . Breast cancer Cousin       daughter of unaffected paternal aunt; dx in her 74s  [name] father died from prostate cancer the age of 55. [name] mother was diagnosed with breast cancer the age of 34. [name] father had 5 sisters, 3 of whom were diagnosed with breast cancer, 2 of them before the age of 103. The patient had one brother, no sisters. There is no history of ovarian cancer in the family.  GYNECOLOGIC HISTORY:  Menarche age 38, first live birth age 56, the patient is [place] P5. Her periods stopped at the time of chemotherapy. 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 Her (former) husband [name] works as an Animal nutritionist. Their divorce was finalized September 2016. [name] oldest child, a son, [name], is studying Therapist, occupational; the patient is a 11 year old daughter [name] is also in college. [name] younger children are 75, 60, and 49. The patient attends a [employer]              ADVANCED DIRECTIVES: Not in place; at the 12/15/2015 visit the patient was given the appropriate forms to complete and notarize at her discretion   HEALTH MAINTENANCE:       Social History  Substance Use Topics  . Smoking status: Former Smoker    Packs/day: 0.25    Years: 15.00    Quit date: 02/18/2009  . Smokeless tobacco: Former Systems developer  . Alcohol use No     Comment: occasional   Colonoscopy: Bone Density Scan:  Pap Smear:  Eye Exam:  Vitamin D Level:   Lipid Panel:         Allergies  Allergen Reactions  . Compazine [Prochlorperazine Edisylate] Other (See Comments)    Stuttering          Current Outpatient Prescriptions  Medication Sig Dispense Refill  . acetaminophen-codeine (TYLENOL #3) 300-30 MG tablet Take 1 tablet by mouth every 4 (four) hours as needed. 30 tablet 0  . Biotin 5000 MCG TABS Take 10,000 mcg by mouth daily. Reported on  06/21/2016    . buPROPion (WELLBUTRIN) 100 MG tablet Take 1 tablet (100 mg total) by mouth 2 (two) times daily. 60 tablet 3  . carvedilol (COREG) 12.5 MG tablet Take 1.5 tablets (18.75 mg total) by mouth 2 (two) times daily with a meal. (Patient not taking: Reported on 09/20/2016) 90 tablet 3  . cyclobenzaprine (FLEXERIL) 10 MG tablet Take 1 tablet (10 mg total) by mouth 3 (three) times daily as needed for muscle spasms. (Patient not taking:  Reported on 09/20/2016) 30 tablet 0  . diphenhydrAMINE (BENADRYL) 25 MG tablet Take 1 tablet (25 mg total) by mouth every 6 (six) hours. (Patient not taking: Reported on 09/20/2016) 20 tablet 0  . ferrous sulfate 325 (65 FE) MG tablet Take 1 tablet (325 mg total) by mouth 3 (three) times daily with meals. 270 tablet 3  . gabapentin (NEURONTIN) 100 MG capsule Take 1 capsule (100 mg total) by mouth 3 (three) times daily. (Patient not taking: Reported on 09/20/2016) 90 capsule 3  . pantoprazole (PROTONIX) 40 MG tablet Take 1 tablet (40 mg total) by mouth daily as needed (for acid reflex). (Patient not taking: Reported on 09/20/2016) 30 tablet 3   No current facility-administered medications for this visit.     OBJECTIVE: Young-appearing African American woman No acute distress    Vitals:   10/16/16 1104  BP: 127/81  Pulse: 76  Resp: 18  Temp: 98.3 F (36.8 C)     Body mass index is 36.09 kg/m.      ECOG FS:1 - Symptomatic but completely ambulatory  Sclerae unicteric, EOMs intact Oropharynx clear and moist No cervical or supraclavicular adenopathy Lungs no rales or rhonchi Heart regular rate and rhythm Abd soft, nontender, positive bowel sounds MSK no focal spinal tenderness, no upper extremity lymphedema Neuro: nonfocal, well oriented, appropriate affect Breasts: Status post bilateral mastectomies. There is no evidence of local recurrence. Both axillae are benign.  LAB RESULTS:  CMP     Labs(Brief)          Component Value  Date/Time   NA 145 10/16/2016 1043   K 4.4 10/16/2016 1043   CL 107 04/23/2016 1353   CO2 27 10/16/2016 1043   GLUCOSE 96 10/16/2016 1043   BUN 16.3 10/16/2016 1043   CREATININE 1.0 10/16/2016 1043   CALCIUM 9.9 10/16/2016 1043   PROT 7.5 10/16/2016 1043   ALBUMIN 3.9 10/16/2016 1043   AST 17 10/16/2016 1043   ALT 19 10/16/2016 1043   ALKPHOS 110 10/16/2016 1043   BILITOT 0.27 10/16/2016 1043   GFRNONAA >60 04/23/2016 1353   GFRAA >60 04/23/2016 1353      I No results found for: SPEP       Lab Results  Component Value Date   WBC 10.1 10/16/2016   NEUTROABS 6.6 (H) 10/16/2016   HGB 13.3 10/16/2016   HCT 40.6 10/16/2016   MCV 88.4 10/16/2016   PLT 338 10/16/2016        Chemistry   Labs(Brief)     Component Value Date/Time   NA 145 10/16/2016 1043   K 4.4 10/16/2016 1043   CL 107 04/23/2016 1353   CO2 27 10/16/2016 1043   BUN 16.3 10/16/2016 1043   CREATININE 1.0 10/16/2016 1043     Labs(Brief)          Component Value Date/Time   CALCIUM 9.9 10/16/2016 1043   ALKPHOS 110 10/16/2016 1043   AST 17 10/16/2016 1043   ALT 19 10/16/2016 1043   BILITOT 0.27 10/16/2016 1043         No results found for: LABCA2  No components found for: LABCA125  LastLabs  No results for input(s): INR in the last 168 hours.    Urinalysis Labs(Brief)          Component Value Date/Time   COLORURINE RED (A) 02/02/2015 1030   APPEARANCEUR CLOUDY (A) 02/02/2015 1030   LABSPEC 1.025 02/16/2015 1243   PHURINE 6.0 02/16/2015 1243   PHURINE 6.0 02/02/2015 1030  GLUCOSEU Negative 02/16/2015 1243   HGBUR Negative 02/16/2015 1243   HGBUR LARGE (A) 02/02/2015 1030   BILIRUBINUR negative 06/21/2016 1120   BILIRUBINUR Negative 02/16/2015 1243   KETONESUR negative 06/21/2016 1120   KETONESUR 5 02/16/2015 1243   KETONESUR NEGATIVE 02/02/2015 1030   PROTEINUR negative 06/21/2016 1120   PROTEINUR < 30  02/16/2015 1243   PROTEINUR 30 (A) 02/02/2015 1030   UROBILINOGEN 0.2 06/21/2016 1120   UROBILINOGEN 0.2 02/16/2015 1243   NITRITE Negative 06/21/2016 1120   NITRITE Negative 02/16/2015 1243   NITRITE NEGATIVE 02/02/2015 1030   LEUKOCYTESUR Negative 06/21/2016 1120   LEUKOCYTESUR Moderate 02/16/2015 1243      STUDIES:  ImagingResults  Ct Angio Chest W/cm &/or Wo Cm  Result Date: 09/26/2016 CLINICAL DATA:  Chest pain since 2015. History of breast cancer. Shortness of breath. EXAM: CT ANGIOGRAPHY CHEST WITH CONTRAST TECHNIQUE: Multidetector CT imaging of the chest was performed using the standard protocol during bolus administration of intravenous contrast. Multiplanar CT image reconstructions and MIPs were obtained to evaluate the vascular anatomy. CONTRAST:  70 mL Isovue 370 COMPARISON:  04/13/2014, 05/11/2014, 12/04/2014 FINDINGS: Cardiovascular: Satisfactory opacification of the pulmonary arteries to the segmental level. No evidence of pulmonary embolism. Normal heart size. No pericardial effusion. Normal caliber thoracic aorta. Mediastinum/Nodes: No enlarged mediastinal, hilar, or axillary lymph nodes. Thyroid gland, trachea, and esophagus demonstrate no significant findings. Lungs/Pleura: Stable 7 mm left lower lobe pulmonary nodule unchanged compared with 04/13/2014. 6 mm right middle lobe pulmonary nodule (image 58/series 5) unchanged compared with 12/04/2014. Lungs are otherwise clear. No pleural effusion or pneumothorax. Upper Abdomen: No acute abnormality. Musculoskeletal: No chest wall abnormality. No acute or significant osseous findings. Other: Bilateral breast implants. Review of the MIP images confirms the above findings. IMPRESSION: 1. No pulmonary embolus. 2. No acute cardiopulmonary disease. 3. 6 mm right middle lobe pulmonary nodule unchanged compared with 12/04/2014. If the patient is at high risk for bronchogenic carcinoma, follow-up chest CT at 6-12 months is  recommended. If the patient is at low risk for bronchogenic carcinoma, follow-up chest CT at 12 months is recommended. This recommendation follows the consensus statement: Guidelines for Management of Small Pulmonary Nodules Detected on CT Scans: A Statement from the [place] as published in Radiology 2005;237:395-400. Electronically Signed   By: Kathreen Devoid   On: 09/26/2016 16:00    CLINICAL DATA: Patient is a BRCA gene mutation carrier. Status post treated left breast cancer, post bilateral mastectomy and implant reconstruction in 2015.  LABS: None.  EXAM: BILATERAL BREAST MRI WITH AND WITHOUT CONTRAST  TECHNIQUE: Multiplanar, multisequence MR images of both breasts were obtained prior to and following the intravenous administration of 19 ml of MultiHance.  THREE-DIMENSIONAL MR IMAGE RENDERING ON INDEPENDENT WORKSTATION:  Three-dimensional MR images were rendered by post-processing of the original MR data on an independent workstation. The three-dimensional MR images were interpreted, and findings are reported in the following complete MRI report for this study. Three dimensional images were evaluated at the independent DynaCad workstation  COMPARISON: MRI dated July 07, 2014. Please note that this is patient's first postsurgical MRI and therefore her new baseline.  FINDINGS: Breast composition: None applicable.  Background parenchymal enhancement: None applicable.  Right breast: No mass or abnormal enhancement. There is an intact subpectoral implant.  Left breast: No mass or abnormal enhancement. There is an intact subpectoral implant. Mild progressive enhancement along the medial border of the implant is likely due to postsurgical changes.  Lymph nodes: No abnormal  appearing right-sided lymph nodes. There are two mildly prominent left intramammary lymph nodes, mainly a 7 mm in long-axis lymph node at the second left costochondral junction and  an 11 mm in long-axis lymph node at the third left costochondral junction.  Ancillary findings: None.  IMPRESSION: No MRI evidence of breast malignancy status post bilateral mastectomy and implant reconstruction.  Two sub pathologic by size criteria, however asymmetric left internal mammary chain lymph nodes, with uncertain significance. These may represent reactive or malignant lymph nodes. Short-term follow-up or correlation with PET-CT may be considered.  RECOMMENDATION: Clinical follow-up is recommended.  BI-RADS CATEGORY 2: Benign.   Electronically Signed By: Fidela Salisbury M.D. On: 09/06/2016 16:24   Assessment: 45 y.o. BRCA-1 positive [place] woman  1. S/p left breast upper outer quadrant biopsy of two separate breast masses and one axillary lymph node 01/29/2014 for a , clinical mT2 N1 stage IIB, invasive ductal carcinoma, grade 2-3,  estrogen and progesterone receptor negative, with an MIB-1 between 79-100%, and HER 2 amplified  2. completed 6 cycles of carboplatin, docetaxel, trastuzumab and pertuzumab 06/30/2014 with MRI 07/07/2014 showing a complete radiologic response  3. trastuzumab was to be continued to complete a year (through March 2016); however echocardiogram 07/05/2014 showed an ejection fraction of 45-50%-- trastuzumab was held after 06/30/2014 dose until EF recovery, resumed 11/09/2014             (a) cath report from 09/13/2014 shows normal coronaries and a normal left ventricular function with an ejection fraction of 55%.             (b) echo 03/01/2015 suggest continuing mild cardiomyopathy: Trastuzumab discontinued--final dose 02/22/2015  4. 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   5. MRSA port infection and septicemia mid-June 2015;  Port was removed, and  PICC line placed, completed antibiotic therapy with ANCEF; PICC subsequently pulled   6 genetic testing with the [facility] panel and was found to have a pathogenic mutation in the BRCA1 gene called c.190T>G (p.Cys64Gly  7. status post bilateral mastectomies and left axillary sentinel lymph node biopsy, withimmediate expander placement, on 07/28/14; the pathology ([id number]) showed a complete pathologic response in the left breast and the 3 sentinel lymph nodes sampled; the right breast was benign             (a) bilateral breast reconstruction revision 03/31/2015  8. Bilateral salpingo-oophorectomy and hysterectomy 02/08/15 with benign pathology  PLAN:  Ranyah remains exceedingly anxious. She can't tell if some of the pain she is experiencing are normal after the surgery she had, which largely they are, or if she is having a heart attack or other major medical problem.  You should be reassuring to her that she had a negative CT scan of the chest and a negative MRI as well.  I have asked her to call us first if she has significant symptoms so that we can evaluated here and this may sever sometime in the emergency room.  I will ask our dentist Dr. Enrique Sack whether he could possibly work. At some point to see if she in fact need some extractions as she thinks.  Otherwise she will see our survivorship nurse practitioner in 3 months. She will see me in 6 months. She knows to call for any problems that may develop before those visits.   Tamara Cruel, MD 10/16/2016 5:53 PM    Electronically signed by Tamara Cruel, MD at 10/16/2016 5:55 PM

## 2016-11-09 NOTE — Telephone Encounter (Signed)
See note from Dr Haroldine Laws 12/7

## 2016-11-09 NOTE — Op Note (Signed)
OPERATIVE REPORT  Patient:            Tamara [name] Date of Birth:  1971/04/09 MRN:                [id number]   DATE OF PROCEDURE:  11/09/2016  PREOPERATIVE DIAGNOSES: 1.  History of breast cancer 2.  History of acute pulpitis 3. Chronic apical periodontitis 4. Multiple retained root segments 5. Dental caries 6. Chronic periodontitis 7. Accretions 8. Malocclusion  POSTOPERATIVE DIAGNOSES: 1.  History of breast cancer 2.  History of acute pulpitis 3. Chronic apical periodontitis 4. Multiple retained root segments 5. Dental caries 6. Chronic periodontitis 7. Accretions 8. Malocclusion  OPERATIONS: 1. Multiple extraction of tooth numbers 1, 2, 18, 30, and 32 2. One Quadrant of alveoloplasty 3. Gross debridement of remaining dentition   SURGEON: [name], DDS  ASSISTANT: Camie Patience, (dental assistant)  ANESTHESIA: General anesthesia via oral endotracheal tube.  MEDICATIONS: 1. Ancef 2 g IV prior to invasive dental procedures. 2. Local anesthesia with a total utilization of 2 carpules each containing 34 mg of lidocaine with 0.017 mg of epinephrine as well as 2 carpules each containing 9 mg of bupivacaine with 0.009 mg of epinephrine.  SPECIMENS: There are 5 teeth that were discarded.  DRAINS: None  CULTURES: None  COMPLICATIONS: None   ESTIMATED BLOOD LOSS: 75 mLs.  INTRAVENOUS FLUIDS: 900 mLs of Lactated ringers solution.  INDICATIONS: The patient was previously diagnosed with breast cancer.  A dental consultation was then requested to evaluate a history of acute pulpitis symptoms.  The patient was examined and treatment planned for multiple extractions with alveoloplasty and gross debridement of remaining dentition in the operating room with general anesthesia.  This treatment plan was formulated to decrease the risks and complications associated with dental infection from affecting the patient's systemic health.  OPERATIVE FINDINGS: Patient was  examined operating room number 5.  The teeth were identified for extraction. The patient was noted be affected by a history of acute pulpitis, chronic apical periodontitis, multiple retained root segments, dental caries, chronic periodontitis, accretions, and malocclusion.   DESCRIPTION OF PROCEDURE: Patient was brought to the main operating room number 5. Patient was then placed in the supine position on the operating table. General anesthesia was then induced per the anesthesia team. The patient was then prepped and draped in the usual manner for dental medicine procedure. A timeout was performed. The patient was identified and procedures were verified. A throat pack was placed at this time. The oral cavity was then thoroughly examined with the findings noted above. The patient was then ready for dental medicine procedure as follows:  Local anesthesia was then administered sequentially with a total utilization of 2 carpules each containing 34 mg of lidocaine with 0.017 mg of epinephrine as well as 2 carpules  each containing 9 mg bupivacaine with 0.009 mg of epinephrine.  The Maxillary right quadrant was first approached. Anesthesia was then delivered utilizing infiltration with lidocaine with epinephrine. A #15 blade incision was then made from the maxillary right tuberosity and extended the mesial #3.  A  surgical flap was then carefully reflected. The teeth were then subluxated with a series of straight elevators. Tooth numbers 1 and 2 were then removed with a 150 forceps. Alveoloplasty was then performed utilizing a ronguers and bone file to have achieve primary closure. No obvious sinus exposure was noted, but the patient will be placed on sinus precautions postoperatively. The surgical site was then irrigated with  copious amounts of sterile saline. A piece of Surgifoam was then placed in the extraction sockets appropriately. The tissues were approximated and trimmed appropriately. The surgical site  was then closed from the maxillary right tuberosity and extended the distal of #3 utilizing 3-0 chromic gut suture in a continuous interrupted suture technique 1. Primary closure was obtained.  At this point time, the mandibular quadrants were approached. The patient was given bilateral inferior alveolar nerve blocks and long buccal nerve blocks utilizing the bupivacaine with epinephrine. Further infiltration was then achieved utilizing the lidocaine with epinephrine. The retained root tips and segments in the area of tooth numbers 18, 30, and 32 were then removed with a 151 forceps or rongeurs without complications. Extraction sites were then curetted and compressed appropriately.  The surgical sites were then irrigated with copious amounts of sterile saline. The mandibular left surgical site was closed utilizing 2 separate interrupted sutures and 3-0 chromic gut material. A piece of Surgifoam was then placed extraction socket #32. The mandibular right surgical site areas numbers 30 was closed utilizing 2 interrupted sutures and 3-0 chromic gut material. The mandibular right surgical site area #32 was closed from the distal of #32 and extended to the distal of #31 utilizing 3-0 chromic gut suture in a continuous interrupted suture technique 1.  At this point time a gross debridement procedure was performed utilizing a KaVo sonic scaler. A series of hand curettes were then utilized to further remove accretions. A sonic scaler was then again used to further refine removal of accretions. This completed the gross debridement procedure.  At this point time, the entire mouth was irrigated with copious amounts of sterile saline. The patient was examined for complications, seeing none, the dental medicine procedure was deemed to be complete. The throat pack was removed at this time. An oral airway was then placed at the request of the anesthesia team. A series of 4 x 4 gauze were placed in the mouth to aid  hemostasis. The patient was then handed over to the anesthesia team for final disposition. After an appropriate amount of time, the patient was extubated and taken to the postanesthsia care unit in good condition. All counts were correct for the dental medicine procedure. The patient was given a prescription for Percocet 5/325. Patient is to take one to 2 tablets every 6 hours as needed for moderate to severe pain. The patient was placed on sinus precautions. Patient is to return to clinic in approximately 7-10 days for evaluation of suture removal.   [name], DDS.

## 2016-11-09 NOTE — Progress Notes (Signed)
PRE-OPERATIVE NOTE:  11/09/2016 Su Grand [id number]  VITALS: BP 122/81   Pulse 62   Temp 98.2 F (36.8 C) (Oral)   Resp 20   Ht 5\' 5"  (1.651 m)   Wt 209 lb 3.2 oz (94.9 kg)   LMP 02/19/2014 Comment: chemo  SpO2 98%   BMI 34.81 kg/m   Lab Results  Component Value Date   WBC 10.1 10/16/2016   HGB 13.3 10/16/2016   HCT 40.6 10/16/2016   MCV 88.4 10/16/2016   PLT 338 10/16/2016   BMET    Component Value Date/Time   NA 145 10/16/2016 1043   K 4.4 10/16/2016 1043   CL 107 04/23/2016 1353   CO2 27 10/16/2016 1043   GLUCOSE 96 10/16/2016 1043   BUN 16.3 10/16/2016 1043   CREATININE 1.0 10/16/2016 1043   CALCIUM 9.9 10/16/2016 1043   GFRNONAA >60 04/23/2016 1353   GFRAA >60 04/23/2016 1353    Lab Results  Component Value Date   INR 1.12 09/04/2015   INR 1.08 08/15/2015   INR 1.08 01/09/2015   No results found for: PTT   Tamara [name] presents for Multiple dental extractions with alveoloplasty and gross debridement of remaining dentition in the operating  room with general anesthesia.   SUBJECTIVE: The patient denies any acute medical or dental changes and agrees to proceed with treatment as planned.  EXAM: No sign of acute dental changes.  ASSESSMENT: Patient is affected by history of acute pulpitis, chronic apical periodontitis, multiple retained root segments, dental caries, chronic periodontitis, accretions, and malocclusion.  PLAN: Patient agrees to proceed with treatment as planned in the operating room as previously discussed and accepts the risks, benefits, and complications of the proposed treatment. Patient is aware of the risk for bleeding, bruising, swelling, infection, pain, nerve damage, soft tissue damage, damage to adjacent teeth, sinus involvement, root tip fracture, mandible fracture, and the risks of complications associated with the anesthesia. Patient also is aware of the potential for other complications up to and including death due to  her overall cardiovascular compromise.   [name], DDS

## 2016-11-09 NOTE — Discharge Instructions (Signed)
General Anesthesia, Adult, Care After These instructions provide you with information about caring for yourself after your procedure. Your health care provider may also give you more specific instructions. Your treatment has been planned according to current medical practices, but problems sometimes occur. Call your health care provider if you have any problems or questions after your procedure. What can I expect after the procedure? After the procedure, it is common to have:  Vomiting.  A sore throat.  Mental slowness. It is common to feel:  Nauseous.  Cold or shivery.  Sleepy.  Tired.  Sore or achy, even in parts of your body where you did not have surgery. Follow these instructions at home: For at least 24 hours after the procedure:  Do not:  Participate in activities where you could fall or become injured.  Drive.  Use heavy machinery.  Drink alcohol.  Take sleeping pills or medicines that cause drowsiness.  Make important decisions or sign legal documents.  Take care of children on your own.  Rest. Eating and drinking  If you vomit, drink water, juice, or soup when you can drink without vomiting.  Drink enough fluid to keep your urine clear or pale yellow.  Make sure you have little or no nausea before eating solid foods.  Follow the diet recommended by your health care provider. General instructions  Have a responsible adult stay with you until you are awake and alert.  Return to your normal activities as told by your health care provider. Ask your health care provider what activities are safe for you.  Take over-the-counter and prescription medicines only as told by your health care provider.  If you smoke, do not smoke without supervision.  Keep all follow-up visits as told by your health care provider. This is important. Contact a health care provider if:  You continue to have nausea or vomiting at home, and medicines are not helpful.  You  cannot drink fluids or start eating again.  You cannot urinate after 8-12 hours.  You develop a skin rash.  You have fever.  You have increasing redness at the site of your procedure. Get help right away if:  You have difficulty breathing.  You have chest pain.  You have unexpected bleeding.  You feel that you are having a life-threatening or urgent problem. This information is not intended to replace advice given to you by your health care provider. Make sure you discuss any questions you have with your health care provider. Document Released: 02/25/2001 Document Revised: 04/23/2016 Document Reviewed: 11/03/2015 Elsevier Interactive Patient Education  2017 [place] your nose  It is best to wipe away nasal secretions carefully. After 2 weeks, if you must blow  your nose, blow gently through both sides at the same time. Do not pinch your  nose; do not blow just one side at a time.   Sneezing  If you must sneeze, keep your mouth open and do not pinch your nose closed.   Sucking  Do not drink through a straw. Do not smoke.    Blowing  Do not play a wind instrument. Do not blow a balloons.   Pushing or lifting  Do not lift or push objects weighing more than 20 pounds.   Bending over  Keeping her head above the level of your heart. Sleep with your head slightly raised.   Notify your dentist if you bleed from your nose. If you see bleeding  from your nose, have neck stiffness, or increased sensitivity to bright light or severe headache, call Dental Medicine or proceed to the Emergency Department for evaluation immediately.  Notify your dentist if you are unable to take any of your medications as prescribed. You may be advised to take an antibiotic and decongestant as well as your regular pain medication. You must take these medications as prescribed. Do not stop taking them on her own. If you have a problem  with any medication, please call us so that we can make an adjustment for you.  Contacts: 1. Dental Medicine: [phone]   2. [facility]: [phone]     MOUTH CARE AFTER SURGERY  FACTS:  Ice used in ice bag helps keep the swelling down, and can help lessen the pain.  It is easier to treat pain BEFORE it happens.  Spitting disturbs the clot and may cause bleeding to start again, or to get worse.  Smoking delays healing and can cause complications.  Sharing prescriptions can be dangerous.  Do not take medications not recently prescribed for you.  Antibiotics may stop birth control pills from working.  Use other means of birth control while on antibiotics.  Warm salt water rinses after the first 24 hours will help lessen the swelling:  Use 1/2 teaspoonful of table salt per oz.of water.  DO NOT:  Do not spit.  Do not drink through a straw.  Strongly advised not to smoke, dip snuff or chew tobacco at least for 3 days.  Do not eat sharp or crunchy foods.  Avoid the area of surgery when chewing.  Do not stop your antibiotics before your instructions say to do so.  Do not eat hot foods until bleeding has stopped.  If you need to, let your food cool down to room temperature.  EXPECT:  Some swelling, especially first 2-3 days.  Soreness or discomfort in varying degrees.  Follow your dentist's instructions about how to handle pain before it starts.  Pinkish saliva or light blood in saliva, or on your pillow in the morning.  This can last around 24 hours.  Bruising inside or outside the mouth.  This may not show up until 2-3 days after surgery.  Don't worry, it will go away in time.  Pieces of "bone" may work themselves loose.  It's OK.  If they bother you, let us know.  WHAT TO DO IMMEDIATELY AFTER SURGERY:  Bite on the gauze with steady pressure for 1-2 hours.  Don't chew on the gauze.  Do not lie down flat.  Raise your head support especially for  the first 24 hours.  Apply ice to your face on the side of the surgery.  You may apply it 20 minutes on and a few minutes off.  Ice for 8-12 hours.  You may use ice up to 24 hours.  Before the numbness wears off, take a pain pill as instructed.  Prescription pain medication is not always required.  SWELLING:  Expect swelling for the first couple of days.  It should get better after that.  If swelling increases 3 days or so after surgery; let us know as soon as possible.  FEVER:  Take Tylenol every 4 hours if needed to lower your temperature, especially if it is at 100F or higher.  Drink lots of fluids.  If the fever does not go away, let us know.  BREATHING TROUBLE:  Any unusual difficulty breathing means you have to have someone bring you  to the emergency room ASAP  BLEEDING:  Light oozing is expected for 24 hours or so.  Prop head up with pillows  Avoid spitting  Do not confuse bright red fresh flowing blood with lots of saliva colored with a little bit of blood.  If you notice some bleeding, place gauze or a tea bag where it is bleeding and apply CONSTANT pressure by biting down for 1 hour.  Avoid talking during this time.  Do not remove the gauze or tea bag during this hour to "check" the bleeding.  If you notice bright RED bleeding FLOWING out of particular area, and filling the floor of your mouth, put a wad of gauze on that area, bite down firmly and constantly.  Call us immediately.  If we're closed, have someone bring you to the emergency room.  ORAL HYGIENE:  Brush your teeth as usual after meals and before bedtime.  Use a soft toothbrush around the area of surgery.  DO NOT AVOID BRUSHING.  Otherwise bacteria(germs) will grow and may delay healing or encourage infection.  Since you cannot spit, just gently rinse and let the water flow out of your mouth.  DO NOT SWISH HARD.  EATING:  Cool liquids are a good point to start.  Increase to soft foods as  tolerated.  PRESCRIPTIONS:  Follow the directions for your prescriptions exactly as written.  If Dr. Enrique Sack gave you a narcotic pain medication, do not drive, operate machinery or drink alcohol when on that medication.  QUESTIONS:  Call our office during office hours [phone] or call the Emergency Room at [phone].

## 2016-11-09 NOTE — Anesthesia Procedure Notes (Signed)
Procedure Name: Intubation Date/Time: 11/09/2016 7:59 AM Performed by: Kenneth Lax, Virgel Gess Pre-anesthesia Checklist: Patient identified, Emergency Drugs available, Suction available, Patient being monitored and Timeout performed Patient Re-evaluated:Patient Re-evaluated prior to inductionOxygen Delivery Method: Circle system utilized Preoxygenation: Pre-oxygenation with 100% oxygen Intubation Type: IV induction Ventilation: Mask ventilation without difficulty Laryngoscope Size: Mac and 4 Grade View: Grade I Tube type: Oral Tube size: 7.5 mm Number of attempts: 1 Airway Equipment and Method: Stylet Placement Confirmation: ETT inserted through vocal cords under direct vision,  positive ETCO2,  CO2 detector and breath sounds checked- equal and bilateral Secured at: 22 cm Tube secured with: Tape Dental Injury: Teeth and Oropharynx as per pre-operative assessment

## 2016-11-12 ENCOUNTER — Encounter (HOSPITAL_COMMUNITY): Payer: Self-pay | Admitting: Dentistry

## 2016-11-15 ENCOUNTER — Encounter (HOSPITAL_COMMUNITY): Payer: Self-pay

## 2016-11-15 ENCOUNTER — Emergency Department (HOSPITAL_COMMUNITY)
Admission: EM | Admit: 2016-11-15 | Discharge: 2016-11-15 | Disposition: A | Discharge status: Home / Self Care | Payer: Medicaid Other | Attending: Emergency Medicine | Admitting: Emergency Medicine

## 2016-11-15 DIAGNOSIS — Y9239 Other specified sports and athletic area as the place of occurrence of the external cause: Secondary | ICD-10-CM | POA: Diagnosis not present

## 2016-11-15 DIAGNOSIS — X500XXA Overexertion from strenuous movement or load, initial encounter: Secondary | ICD-10-CM | POA: Insufficient documentation

## 2016-11-15 DIAGNOSIS — Y93B9 Activity, other involving muscle strengthening exercises: Secondary | ICD-10-CM | POA: Diagnosis not present

## 2016-11-15 DIAGNOSIS — S56812A Strain of other muscles, fascia and tendons at forearm level, left arm, initial encounter: Secondary | ICD-10-CM | POA: Diagnosis not present

## 2016-11-15 DIAGNOSIS — T148XXA Other injury of unspecified body region, initial encounter: Secondary | ICD-10-CM

## 2016-11-15 DIAGNOSIS — M7918 Myalgia, other site: Secondary | ICD-10-CM

## 2016-11-15 DIAGNOSIS — I10 Essential (primary) hypertension: Secondary | ICD-10-CM | POA: Diagnosis not present

## 2016-11-15 DIAGNOSIS — Z853 Personal history of malignant neoplasm of breast: Secondary | ICD-10-CM | POA: Insufficient documentation

## 2016-11-15 DIAGNOSIS — S59912A Unspecified injury of left forearm, initial encounter: Secondary | ICD-10-CM | POA: Diagnosis present

## 2016-11-15 DIAGNOSIS — Z87891 Personal history of nicotine dependence: Secondary | ICD-10-CM | POA: Diagnosis not present

## 2016-11-15 DIAGNOSIS — Y999 Unspecified external cause status: Secondary | ICD-10-CM | POA: Insufficient documentation

## 2016-11-15 MED ORDER — IBUPROFEN 200 MG PO TABS
600.0000 mg | ORAL_TABLET | Freq: Once | ORAL | Status: AC
  Administered 2016-11-15: 600 mg via ORAL
  Filled 2016-11-15: qty 3

## 2016-11-15 NOTE — ED Provider Notes (Signed)
[facility] DEPT Provider Note   CSN: [id number] Arrival date & time: 11/15/16  1722     History   Chief Complaint Chief Complaint  Patient presents with  . Arm Pain    HPI Tamara [name] is a 45 y.o. female.  HPI   45 year old female with left arm pain. Onset shortly before arrival while working out. Patient describes doing what sounds like seated rows. As she was pulling back she had acute onset of pain near her R elbow. She has had persistent pain there since. Worse with touch and movement. No numbness or tingling.   Past Medical History:  Diagnosis Date  . Anemia   . BRCA1 positive    c.190T>G (p.Cys64Gly) @ [facility]   . Breast cancer ([place]) 02/01/14   ER-/PR-/Her2+, still receiving chemo 12/21/14, last Herceptin 02-02-15.Left breat cancer.  . Chest pain    pt states is secondary to bilateral mastectomy  . Depression   . DVT (deep venous thrombosis) (HCC)    Right Subclavian and IJ.. Lovenox stopped 01-10-15 until after surgery planned 02-08-15.  Marland Kitchen Dysrhythmia   . Endometriosis   . Fibroid tumor    02-02-15 remains with abdominal pain and some vaginal bleeding due to fibroids.  Marland Kitchen GERD (gastroesophageal reflux disease)   . History of blood transfusion    last 9'15. due to chemotherapy.  Marland Kitchen History of chemotherapy   . History of staph infection   . Hypertension   . Iron deficiency anemia, unspecified 02/25/2014  . PONV (postoperative nausea and vomiting)    needs scop patch  . Rash 02/22/2015  . Shortness of breath     when Hemoglobin low, now resolved after transfusions 9'15.; increased exertion as stated per pt during PAT visit 11/08/2016  . Wears glasses     Patient Active Problem List   Diagnosis Date Noted  . Chronic periodontitis 11/05/2016  . Adjustment disorder with mixed anxiety and depressed mood 09/20/2016  . Left hip pain 07/19/2016  . Urination frequency 06/21/2016  . Low back pain, non-specific 06/21/2016  . Acute deep vein thrombosis (DVT) of proximal end  of right lower extremity ([place]) 12/15/2015  . Breast cancer, BRCA1 positive ([place]) 12/15/2015  . Genetic testing 10/10/2015  . Chest pain 08/28/2015  . Precordial chest pain 08/18/2015  . 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 Rash 02/22/2015  . Long term current use of anticoagulant therapy 02/22/2015  . 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 02/08/2015  . Heavy menstrual bleeding 01/11/2015  . New daily persistent headache 12/16/2014  . Essential hypertension, benign 12/16/2014  . Moderate recurrent major depression ([place]) 12/16/2014  . Cardiomyopathy ([place]) 10/20/2014  . Cardiomyopathy, ischemic 09/06/2014  . Fever 09/02/2014  . Neoplasm related pain 09/02/2014  . Anemia 08/07/2014  . Symptomatic anemia 08/07/2014  . Absence of breast 08/06/2014  . Breast cancer ([place]) 07/28/2014  . Bloodstream infection due to port-a-cath 05/30/2014  . Acute blood loss anemia 05/20/2014  . Staphylococcus aureus bacteremia with sepsis ([place]) 05/19/2014  . Sinus tachycardia 05/19/2014  . AKI (acute kidney injury) ([place]) 05/19/2014  . Palpitations 05/05/2014  . BRCA1 positive   . Local infection due to port-a-cath 03/09/2014  . Iron deficiency anemia 02/25/2014  . HTN (hypertension) 02/19/2014  . Breast cancer of upper-outer quadrant of left female breast ([place]) 02/02/2014    Past Surgical History:  Procedure Laterality Date  . BILATERAL TOTAL MASTECTOMY WITH AXILLARY LYMPH NODE DISSECTION Bilateral 07/28/2014   Procedure: BILATERAL TOTAL MASTECTOMY WITH LEFT AXILLARY SENTINEL LYMPH NODE BIOPSY;  Surgeon: Stark Klein, MD;  Location: [facility];  Service: General;  Laterality: Bilateral;  . BREAST RECONSTRUCTION Bilateral 03/31/2015   Procedure: BILATERAL BREAST RECONSTRUCTION REVISION WITH BILATERAL LIPOFILLING;  Surgeon: Theodoro Kos, DO;  Location: [facility];  Service: Plastics;  Laterality: Bilateral;  . BREAST RECONSTRUCTION WITH PLACEMENT OF TISSUE EXPANDER AND FLEX HD (ACELLULAR  HYDRATED DERMIS) Bilateral 07/28/2014   Procedure: BILATERAL BREAST RECONSTRUCTION WITH PLACEMENT OF TISSUE EXPANDER AND FLEX HD (ACELLULAR HYDRATED DERMIS);  Surgeon: Theodoro Kos, DO;  Location: [facility];  Service: Plastics;  Laterality: Bilateral;  . CARDIAC CATHETERIZATION     10'15- Dr. Sung Amabile  . CERVICAL POLYPECTOMY  2010  . CESAREAN SECTION     one previous  . LEFT HEART CATHETERIZATION WITH CORONARY ANGIOGRAM N/A 09/13/2014   Procedure: LEFT HEART CATHETERIZATION WITH CORONARY ANGIOGRAM;  Surgeon: Jolaine Artist, MD;  Location: [facility] CATH [facility];  Service: Cardiovascular;  Laterality: N/A;  . LIPOSUCTION WITH LIPOFILLING Bilateral 03/31/2015   Procedure: LIPOSUCTION WITH LIPOFILLING;  Surgeon: Theodoro Kos, DO;  Location: [facility];  Service: Plastics;  Laterality: Bilateral;  . MASTECTOMY    . MULTIPLE EXTRACTIONS WITH ALVEOLOPLASTY N/A 11/09/2016   Procedure: Extraction of tooth #'s 1,2,18,30, and 32 with alveoloplasty and gross debridement of remaining teeth.;  Surgeon: [name], DDS;  Location: [facility];  Service: Oral Surgery;  Laterality: N/A;  . PORT-A-CATH REMOVAL N/A 05/21/2014   Procedure: REMOVAL PORT-A-CATH;  Surgeon: Zenovia Jarred, MD;  Location: [facility];  Service: General;  Laterality: N/A;  . PORTACATH PLACEMENT Right 02/23/2014   Procedure: INSERTION PORT-A-CATH;  Surgeon: Joyice Faster. Cornett, MD;  Location: [facility];  Service: General;  Laterality: Right;  . REMOVAL OF TISSUE EXPANDER AND PLACEMENT OF IMPLANT Bilateral 10/14/2014   Procedure: REMOVAL OF BILATERAL BREAST  TISSUE EXPANDER  WITH PLASCEMENT OF BILATERAL  BREAST IMPLANTS;  Surgeon: Theodoro Kos, DO;  Location: [facility];  Service: Plastics;  Laterality: Bilateral;  . ROBOTIC ASSISTED TOTAL HYSTERECTOMY WITH BILATERAL SALPINGO OOPHERECTOMY Bilateral 02/08/2015   Procedure: ROBOTIC ASSISTED TOTAL HYSTERECTOMY WITH BILATERAL SALPINGO OOPHORECTOMY; UTERUS WEIGHING  GREATER THAN 250 GRAMS;  Surgeon: Everitt Amber, MD;  Location: [facility];  Service: Gynecology;  Laterality: Bilateral;  BRCA 1 GENE MUTATION  . TEE WITHOUT CARDIOVERSION N/A 05/26/2014   Procedure: TRANSESOPHAGEAL ECHOCARDIOGRAM (TEE);  Surgeon: Larey Dresser, MD;  Location: [facility];  Service: Cardiovascular;  Laterality: N/A;  . TUBAL LIGATION      OB History    Gravida Para Term Preterm AB Living   5 5 5     5    SAB TAB Ectopic Multiple Live Births                   Home Medications    Prior to Admission medications   Medication Sig Start Date End Date Taking? Authorizing Provider  Biotin 5000 MCG TABS Take 10,000 mcg by mouth daily. Reported on 06/21/2016    Historical Provider, MD  buPROPion (WELLBUTRIN) 100 MG tablet Take 1 tablet (100 mg total) by mouth 2 (two) times daily. 09/20/16   Tresa Garter, MD  carvedilol (COREG) 12.5 MG tablet Take 12.5 mg by mouth 2 (two) times daily as needed. For hypertension    Historical Provider, MD  ferrous sulfate 325 (65 FE) MG tablet Take 1 tablet (325 mg total) by mouth 3 (three) times daily with meals. 09/20/16   Tresa Garter, MD  oxyCODONE-acetaminophen (ROXICET) 5-325 MG  tablet Take 1-2 tablets by mouth every 6 (six) hours as needed for severe pain. 11/09/16   [name], DDS  pantoprazole (PROTONIX) 40 MG tablet Take 40 mg by mouth daily as needed.    Historical Provider, MD    Family History Family History  Problem Relation Age of Onset  . Breast cancer Mother 33    currently 103  . Diabetes Father   . Pancreatic cancer Father 63  . Breast cancer Paternal Aunt 66    currently 88; BRCA1 positive  . Stroke Maternal Grandfather   . Cancer Paternal Aunt     unk. primary; deceased 5s  . Breast cancer Cousin     daughter of unaffected paternal aunt; dx in her 31s    Social History Social History  Substance Use Topics  . Smoking status: Former Smoker    Packs/day: 0.25    Years: 12.00    Types: Cigarettes      Quit date: 02/18/2009  . Smokeless tobacco: Never Used  . Alcohol use No     Allergies   Compazine [prochlorperazine edisylate]   Review of Systems Review of Systems   All systems reviewed and negative, other than as noted in HPI.    Physical Exam Updated Vital Signs BP (!) 152/105 (BP Location: Right Arm)   Pulse 69   Temp 98.3 F (36.8 C) (Oral)   Resp 18   Ht 5' 5"  (1.651 m)   Wt 204 lb (92.5 kg)   LMP 02/19/2014 Comment: chemo  SpO2 98%   BMI 33.95 kg/m   Physical Exam  Constitutional: She appears well-developed and well-nourished. No distress.  HENT:  Head: Normocephalic and atraumatic.  Eyes: Conjunctivae are normal. Right eye exhibits no discharge. Left eye exhibits no discharge.  Neck: Neck supple.  Cardiovascular: Normal rate, regular rhythm and normal heart sounds.  Exam reveals no gallop and no friction rub.   No murmur heard. Pulmonary/Chest: Effort normal and breath sounds normal. No respiratory distress.  Abdominal: Soft. She exhibits no distension. There is no tenderness.  Musculoskeletal: She exhibits no edema or tenderness.  LUE grossly normal in appearance and symmetric as compared to the left. Point tenderness localized to left brachioradialis musculature. No significant mass/bulge or other deformity. Increased pain with range of motion. Neurovascular intact.  Neurological: She is alert.  Skin: Skin is warm and dry.  Psychiatric: She has a normal mood and affect. Her behavior is normal. Thought content normal.  Nursing note and vitals reviewed.    ED Treatments / Results  Labs (all labs ordered are listed, but only abnormal results are displayed) Labs Reviewed - No data to display  EKG  EKG Interpretation None       Radiology No results found.  Procedures Procedures (including critical care time)  Medications Ordered in ED Medications  ibuprofen (ADVIL,MOTRIN) tablet 600 mg (not administered)     Initial Impression /  Assessment and Plan / ED Course  I have reviewed the triage vital signs and the nursing notes.  Pertinent labs & imaging results that were available during my care of the patient were reviewed by me and considered in my medical decision making (see chart for details).  Clinical Course     45 year old female with likely strain of her brachioradialis. She is neurovascularly intact. Symptomatically treatment. Sling for comfort. Sports medicine orthopedic follow-up for persistent symptoms.  Final Clinical Impressions(s) / ED Diagnoses   Final diagnoses:  Brachioradialis muscle tenderness  Muscle strain  New Prescriptions New Prescriptions   No medications on file     Virgel Manifold, MD 11/20/16 1124

## 2016-11-15 NOTE — ED Triage Notes (Signed)
Patient states she was at the gym today lifting 25 lbs with both arms and felt a pop in the left upper arm. Swelling noted. patient had a compression sleeve on.

## 2016-11-19 ENCOUNTER — Ambulatory Visit (HOSPITAL_COMMUNITY): Payer: Medicaid - Dental | Admitting: Dentistry

## 2016-11-19 ENCOUNTER — Encounter (HOSPITAL_COMMUNITY): Payer: Self-pay | Admitting: Dentistry

## 2016-11-19 VITALS — BP 113/71 | HR 67 | Temp 98.3°F

## 2016-11-19 DIAGNOSIS — K036 Deposits [accretions] on teeth: Secondary | ICD-10-CM

## 2016-11-19 DIAGNOSIS — K0530 Chronic periodontitis, unspecified: Secondary | ICD-10-CM

## 2016-11-19 DIAGNOSIS — K08409 Partial loss of teeth, unspecified cause, unspecified class: Secondary | ICD-10-CM

## 2016-11-19 DIAGNOSIS — M264 Malocclusion, unspecified: Secondary | ICD-10-CM

## 2016-11-19 DIAGNOSIS — Z171 Estrogen receptor negative status [ER-]: Secondary | ICD-10-CM

## 2016-11-19 DIAGNOSIS — K08199 Complete loss of teeth due to other specified cause, unspecified class: Secondary | ICD-10-CM

## 2016-11-19 DIAGNOSIS — C50412 Malignant neoplasm of upper-outer quadrant of left female breast: Secondary | ICD-10-CM

## 2016-11-19 NOTE — Progress Notes (Signed)
POST OPERATIVE NOTE:  11/19/2016 Su Grand [id number]  VITALS: BP 113/71 (BP Location: Right Arm)   Pulse 67   Temp 98.3 F (36.8 C) (Oral)   LMP 02/19/2014 Comment: chemo  LABS:  Lab Results  Component Value Date   WBC 10.1 10/16/2016   HGB 13.3 10/16/2016   HCT 40.6 10/16/2016   MCV 88.4 10/16/2016   PLT 338 10/16/2016   BMET    Component Value Date/Time   NA 145 10/16/2016 1043   K 4.4 10/16/2016 1043   CL 107 04/23/2016 1353   CO2 27 10/16/2016 1043   GLUCOSE 96 10/16/2016 1043   BUN 16.3 10/16/2016 1043   CREATININE 1.0 10/16/2016 1043   CALCIUM 9.9 10/16/2016 1043   GFRNONAA >60 04/23/2016 1353   GFRAA >60 04/23/2016 1353    Lab Results  Component Value Date   INR 1.12 09/04/2015   INR 1.08 08/15/2015   INR 1.08 01/09/2015   No results found for: PTT   Tamara [name] is status post multiple extractions with alveoloplasty and gross debridement of remaining dentitionin the operating room on 11/09/2016.  Patient now presents for evaluation of healing and suture removal as needed.  SUBJECTIVE: Patient with minimal complaints from dental extraction sites. Patient denies having any sinus problems. Some stitches remain.  EXAM: There is no sign of infection, heme, or ooze. Sutures are loosely intact. Patient is healing in by generalized primary closure with several areas healing in by secondary intention No sinus exposure noted. Val salva maneuver negative.  PROCEDURE: The patient was given a chlorhexidine gluconate rinse for 30 seconds. Sutures were then removed without complication. Patient tolerated the procedure well.  ASSESSMENT: Post operative course is consistent with dental procedures performed in the operating room. Loss of teeth due to extraction Multiple missing teeth Chronic periodontitis  PLAN: 1. Continue salt water rinses as needed aid healing. 2. Advance diet as tolerated. 3. Follow-up with a new primary dentist of her choice for  exam, radiographs, discussion of other dental treatment needs.   [name], DDS

## 2016-11-19 NOTE — Patient Instructions (Signed)
PLAN: 1. Continue salt water rinses as needed aid healing. 2. Advance diet as tolerated. 3. Follow-up with a new primary dentist of her choice for exam, radiographs, discussion of other dental treatment needs. Dr. Enrique Sack

## 2016-11-27 ENCOUNTER — Emergency Department (HOSPITAL_COMMUNITY)
Admission: EM | Admit: 2016-11-27 | Discharge: 2016-11-27 | Disposition: A | Discharge status: Home / Self Care | Payer: Medicaid Other

## 2016-11-27 NOTE — ED Notes (Signed)
Pt called for the 3rd time with no answer

## 2016-11-27 NOTE — ED Notes (Signed)
Called x 2 to triage, no response.

## 2016-12-14 ENCOUNTER — Other Ambulatory Visit: Payer: Self-pay | Admitting: Nurse Practitioner

## 2017-01-16 NOTE — Progress Notes (Deleted)
CLINIC:  Survivorship   REASON FOR VISIT:  Routine follow-up for history of breast cancer.   BRIEF ONCOLOGIC HISTORY:    Breast cancer of upper-outer quadrant of left female breast ([place])   01/08/2014 Mammogram    Left breast: 3 cm breast mass, 7 cm from the nipple      01/08/2014 Breast US    Left breast: irregularly marginated hypoechoic mass with increased vascularity located at 2 o'clock position, 7 cm from the nipple corresponding to the palpable and mammographic finding. This measures 3.2 x 2.6 x 2.0 cm      01/29/2014 Initial Biopsy    Left breast core needle bx: 2:00 lesion, 7 CFN: IDC, DCIS, +LVI; 2:00, 9CFM: IDC, DCIS.  Left axillary LN: positive for malignancy.  ER- (0%), PR- (0%), HER2/neu amplified (ratio 5.1), Ki67 79-100%.      02/07/2014 Breast MRI    Two enhancing masses in the upper-outer quadrant of the left breast with enlarged axillary adenopathy corresponding with the patient's known areas of invasive ductal carcinoma and DCIS as well as axillary metastasis.       02/07/2014 Clinical Stage    Stage IIB: T2 N1      02/22/2014 Procedure    [facility] panel and was found to have a pathogenic mutation in the BRCA1 gene called c.190T>G (p.Cys64Gly      02/22/2014 Procedure    Right breast core needle bx: negative for malignancy      02/25/2014 - 06/30/2014 Neo-Adjuvant Chemotherapy    Docetaxel, carboplatin, trastuzumab, and pertuzumab (3/26-7/29/15) followed by maintenance trastuzumab      06/30/2014 -  Chemotherapy    Maintenace trastuzumab.  Held 07/2014 due to decreased EF=  resumed 11/09/2014; continued decline; d/c'd 02/22/15; resumed 08/04/15 after discussion with Dr. Haroldine Laws      07/07/2014 Breast MRI    no remaining abnormal enhancement or mass seen the left breast. No suspicious findings on either side      07/28/2014 Definitive Surgery    Bilateral mastectomy/SLNB Barry Dienes) with implant placement: no evidence of residual malignancy in left breast;  negative in right breast. 3 Left axillary LN removed and no evidence of malignancy (0/3). two benign LN from right.      07/28/2014 Pathologic Stage    ypT0 ypN0      10/14/2014 Surgery    Removal of bilateral breast tissue expander with placement of bilateral breast implants ([place])       02/08/2015 Surgery    Bilateral salpingo-oophorectomy and hysterectomy with benign pathology      03/31/2015 Surgery    Breast reconstruction revision with bilateral lipofilling ([facility])        INTERVAL HISTORY:  Tamara [name] presents to the [facility] today for routine follow-up for her history of breast cancer.  Overall, she reports feeling quite well. ***    REVIEW OF SYSTEMS:  ***     Breast: Denies any new nodularity, masses, tenderness, nipple changes, or nipple discharge.    A 14-point review of systems was completed and was negative, except as noted above.    PAST MEDICAL/SURGICAL HISTORY:  Past Medical History:  Diagnosis Date  . Anemia   . BRCA1 positive    c.190T>G (p.Cys64Gly) @ [facility]   . Breast cancer ([place]) 02/01/14   ER-/PR-/Her2+, still receiving chemo 12/21/14, last Herceptin 02-02-15.Left breat cancer.  . Chest pain    pt states is secondary to bilateral mastectomy  . Depression   . DVT (deep venous thrombosis) ([place])  Right Subclavian and IJ.. Lovenox stopped 01-10-15 until after surgery planned 02-08-15.  Marland Kitchen Dysrhythmia   . Endometriosis   . Fibroid tumor    02-02-15 remains with abdominal pain and some vaginal bleeding due to fibroids.  Marland Kitchen GERD (gastroesophageal reflux disease)   . History of blood transfusion    last 9'15. due to chemotherapy.  Marland Kitchen History of chemotherapy   . History of staph infection   . Hypertension   . Iron deficiency anemia, unspecified 02/25/2014  . PONV (postoperative nausea and vomiting)    needs scop patch  . Rash 02/22/2015  . Shortness of breath     when Hemoglobin low, now resolved after transfusions 9'15.; increased exertion  as stated per pt during PAT visit 11/08/2016  . Wears glasses    Past Surgical History:  Procedure Laterality Date  . BILATERAL TOTAL MASTECTOMY WITH AXILLARY LYMPH NODE DISSECTION Bilateral 07/28/2014   Procedure: BILATERAL TOTAL MASTECTOMY WITH LEFT AXILLARY SENTINEL LYMPH NODE BIOPSY;  Surgeon: Stark Klein, MD;  Location: [facility];  Service: General;  Laterality: Bilateral;  . BREAST RECONSTRUCTION Bilateral 03/31/2015   Procedure: BILATERAL BREAST RECONSTRUCTION REVISION WITH BILATERAL LIPOFILLING;  Surgeon: Theodoro Kos, DO;  Location: [facility];  Service: Plastics;  Laterality: Bilateral;  . BREAST RECONSTRUCTION WITH PLACEMENT OF TISSUE EXPANDER AND FLEX HD (ACELLULAR HYDRATED DERMIS) Bilateral 07/28/2014   Procedure: BILATERAL BREAST RECONSTRUCTION WITH PLACEMENT OF TISSUE EXPANDER AND FLEX HD (ACELLULAR HYDRATED DERMIS);  Surgeon: Theodoro Kos, DO;  Location: [facility];  Service: Plastics;  Laterality: Bilateral;  . CARDIAC CATHETERIZATION     10'15- Dr. Sung Amabile  . CERVICAL POLYPECTOMY  2010  . CESAREAN SECTION     one previous  . LEFT HEART CATHETERIZATION WITH CORONARY ANGIOGRAM N/A 09/13/2014   Procedure: LEFT HEART CATHETERIZATION WITH CORONARY ANGIOGRAM;  Surgeon: Jolaine Artist, MD;  Location: [facility] CATH [facility];  Service: Cardiovascular;  Laterality: N/A;  . LIPOSUCTION WITH LIPOFILLING Bilateral 03/31/2015   Procedure: LIPOSUCTION WITH LIPOFILLING;  Surgeon: Theodoro Kos, DO;  Location: [facility];  Service: Plastics;  Laterality: Bilateral;  . MASTECTOMY    . MULTIPLE EXTRACTIONS WITH ALVEOLOPLASTY N/A 11/09/2016   Procedure: Extraction of tooth #'s 1,2,18,30, and 32 with alveoloplasty and gross debridement of remaining teeth.;  Surgeon: [name], DDS;  Location: [facility];  Service: Oral Surgery;  Laterality: N/A;  . PORT-A-CATH REMOVAL N/A 05/21/2014   Procedure: REMOVAL PORT-A-CATH;  Surgeon: Zenovia Jarred, MD;  Location: [facility];  Service:  General;  Laterality: N/A;  . PORTACATH PLACEMENT Right 02/23/2014   Procedure: INSERTION PORT-A-CATH;  Surgeon: Joyice Faster. Cornett, MD;  Location: [place];  Service: General;  Laterality: Right;  . REMOVAL OF TISSUE EXPANDER AND PLACEMENT OF IMPLANT Bilateral 10/14/2014   Procedure: REMOVAL OF BILATERAL BREAST  TISSUE EXPANDER  WITH PLASCEMENT OF BILATERAL  BREAST IMPLANTS;  Surgeon: Theodoro Kos, DO;  Location: [facility];  Service: Plastics;  Laterality: Bilateral;  . ROBOTIC ASSISTED TOTAL HYSTERECTOMY WITH BILATERAL SALPINGO OOPHERECTOMY Bilateral 02/08/2015   Procedure: ROBOTIC ASSISTED TOTAL HYSTERECTOMY WITH BILATERAL SALPINGO OOPHORECTOMY; UTERUS WEIGHING GREATER THAN 250 GRAMS;  Surgeon: Everitt Amber, MD;  Location: [facility];  Service: Gynecology;  Laterality: Bilateral;  BRCA 1 GENE MUTATION  . TEE WITHOUT CARDIOVERSION N/A 05/26/2014   Procedure: TRANSESOPHAGEAL ECHOCARDIOGRAM (TEE);  Surgeon: Larey Dresser, MD;  Location: [place];  Service: Cardiovascular;  Laterality: N/A;  . TUBAL LIGATION       ALLERGIES:  Allergies  Allergen Reactions  . Compazine [Prochlorperazine Edisylate] Other (See Comments)    Stuttering     CURRENT MEDICATIONS:  Outpatient Encounter Prescriptions as of 01/17/2017  Medication Sig Note  . Biotin 5000 MCG TABS Take 10,000 mcg by mouth daily. Reported on 06/21/2016   . buPROPion (WELLBUTRIN) 100 MG tablet Take 1 tablet (100 mg total) by mouth 2 (two) times daily.   . carvedilol (COREG) 12.5 MG tablet Take 12.5 mg by mouth 2 (two) times daily as needed. For hypertension 11/08/2016: Pt states is currently not using; no prescription in home at present time;  . ferrous sulfate 325 (65 FE) MG tablet Take 1 tablet (325 mg total) by mouth 3 (three) times daily with meals.   . oxyCODONE-acetaminophen (ROXICET) 5-325 MG tablet Take 1-2 tablets by mouth every 6 (six) hours as needed for severe pain.   . pantoprazole (PROTONIX) 40 MG  tablet Take 40 mg by mouth daily as needed.    No facility-administered encounter medications on file as of 01/17/2017.      ONCOLOGIC FAMILY HISTORY:  Family History  Problem Relation Age of Onset  . Breast cancer Mother 56    currently 59  . Diabetes Father   . Pancreatic cancer Father 55  . Breast cancer Paternal Aunt 48    currently 60; BRCA1 positive  . Stroke Maternal Grandfather   . Cancer Paternal Aunt     unk. primary; deceased 70s  . Breast cancer Cousin     daughter of unaffected paternal aunt; dx in her 40s    GENETIC COUNSELING/TESTING: BRCA1 positive  SOCIAL HISTORY:  Calvin Simms is /single/married/divorced/widowed/separated and lives alone/with her spouse/family/friend in (city), [place].  She has (#) children and they live in (city).  [name] is currently retired/disabled/working part-time/full-time as ***.  She denies any current or history of tobacco, alcohol, or illicit drug use.     PHYSICAL EXAMINATION:  Vital Signs: There were no vitals filed for this visit. There were no vitals filed for this visit. General: Well-nourished, well-appearing female in no acute distress.  Unaccompanied/Accompanied by***** today.   HEENT: Head is normocephalic.  Pupils equal and reactive to light. Conjunctivae clear without exudate.  Sclerae anicteric. Oral mucosa is pink, moist.  Oropharynx is pink without lesions or erythema.  Lymph: No cervical, supraclavicular, or infraclavicular lymphadenopathy noted on palpation.  Cardiovascular: Regular rate and rhythm.. Respiratory: Clear to auscultation bilaterally. Chest expansion symmetric; breathing non-labored.  Breast Exam:  -Left breast: No appreciable masses on palpation. No skin redness, thickening, or peau d'orange appearance; no nipple retraction or nipple discharge; mild distortion in symmetry at previous lumpectomy site***healed scar without erythema or nodularity.  -Right breast: No appreciable masses on  palpation. No skin redness, thickening, or peau d'orange appearance; no nipple retraction or nipple discharge; mild distortion in symmetry at previous lumpectomy site***healed scar without erythema or nodularity. -Axilla: No axillary adenopathy bilaterally.  GI: Abdomen soft and round; non-tender, non-distended. Bowel sounds normoactive. No hepatosplenomegaly.   GU: Deferred.  Neuro: No focal deficits. Steady gait.  Psych: Mood and affect normal and appropriate for situation.  Extremities: No edema. Skin: Warm and dry.  LABORATORY DATA:  None for this visit   DIAGNOSTIC IMAGING:  Most recent mammogram: n/a    ASSESSMENT AND PLAN:  Ms.. Simms is a pleasant 45 y.o. female with history of Stage IIB left breast invasive ductal carcinoma, ER-/PR-/HER2+, diagnosed in 2013, treated with neoadjuvant chemotherapy, bilateral mastectomy, Herceptin treatment .  She [facility]   [facility] for surveillance and routine follow-up.   1. History of breast cancer:  Tamara [name] is currently clinically and radiographically without evidence of disease or recurrence of breast cancer.   She will return to the cancer center to see her medical oncologist, Dr. Jana Hakim, in May/2018.  I encouraged her to call me with any questions or concerns before her next visit at the cancer center, and I would be happy to see her sooner, if needed.    #. Problem(s) at Visit___________________.  #. Cancer screening:  Due to [name]'s history and her age, she should receive screening for skin cancers, colon cancer, . She was encouraged to follow-up with her PCP for appropriate cancer screenings.   #. Health maintenance and wellness promotion: Tamara [name] was encouraged to consume 5-7 servings of fruits and vegetables per day. She was also encouraged to engage in moderate to vigorous exercise for 30 minutes per day most days of the week. She was instructed to limit her alcohol consumption and continue to abstain from  tobacco use/was encouraged stop smoking.  ***    Dispo:  -Return to cancer center to see Dr. Jana Hakim on 04/18/2017 as scheduled   A total of (#) minutes of face-to-face time was spent with this patient with greater than 50% of that time in counseling and care-coordination.   Charlestine Massed, NP Survivorship Program [facility] [phone]   Note: PRIMARY CARE PROVIDER Angelica Chessman, [place] [phone]

## 2017-01-17 ENCOUNTER — Encounter: Payer: Medicaid Other | Admitting: Adult Health

## 2017-01-17 ENCOUNTER — Encounter: Payer: Self-pay | Admitting: Adult Health

## 2017-01-28 ENCOUNTER — Telehealth: Payer: Self-pay

## 2017-01-28 ENCOUNTER — Telehealth: Payer: Self-pay | Admitting: Oncology

## 2017-01-28 NOTE — Telephone Encounter (Signed)
Returned call to pt r/s pt LTS appt. Pt has new appt 3/6 at 1130 am

## 2017-01-28 NOTE — Telephone Encounter (Signed)
Pt states she has been trying to r/s her missed appt.of 2/15 for the last week. Called pt back that inbasket sent.

## 2017-02-05 ENCOUNTER — Ambulatory Visit (HOSPITAL_BASED_OUTPATIENT_CLINIC_OR_DEPARTMENT_OTHER): Payer: Medicaid Other | Admitting: Adult Health

## 2017-02-05 ENCOUNTER — Encounter: Payer: Self-pay | Admitting: Adult Health

## 2017-02-05 VITALS — BP 139/87 | HR 72 | Temp 98.0°F | Resp 18 | Ht 65.0 in | Wt 215.8 lb

## 2017-02-05 DIAGNOSIS — Z853 Personal history of malignant neoplasm of breast: Secondary | ICD-10-CM | POA: Diagnosis not present

## 2017-02-05 DIAGNOSIS — R911 Solitary pulmonary nodule: Secondary | ICD-10-CM

## 2017-02-05 DIAGNOSIS — M25552 Pain in left hip: Secondary | ICD-10-CM

## 2017-02-05 DIAGNOSIS — C50412 Malignant neoplasm of upper-outer quadrant of left female breast: Secondary | ICD-10-CM

## 2017-02-05 DIAGNOSIS — M79671 Pain in right foot: Secondary | ICD-10-CM | POA: Diagnosis not present

## 2017-02-05 DIAGNOSIS — Z171 Estrogen receptor negative status [ER-]: Secondary | ICD-10-CM

## 2017-02-05 NOTE — Progress Notes (Signed)
CLINIC:  Survivorship   REASON FOR VISIT:  Routine follow-up for history of breast cancer.   BRIEF ONCOLOGIC HISTORY:    Breast cancer of upper-outer quadrant of left female breast (Tamara [name])   01/08/2014 Mammogram    Left breast: 3 cm breast mass, 7 cm from the nipple      01/08/2014 Breast US    Left breast: irregularly marginated hypoechoic mass with increased vascularity located at 2 o'clock position, 7 cm from the nipple corresponding to the palpable and mammographic finding. This measures 3.2 x 2.6 x 2.0 cm      01/29/2014 Initial Biopsy    Left breast core needle bx: 2:00 lesion, 7 CFN: IDC, DCIS, +LVI; 2:00, 9CFM: IDC, DCIS.  Left axillary LN: positive for malignancy.  ER- (0%), PR- (0%), HER2/neu amplified (ratio 5.1), Ki67 79-100%.      02/07/2014 Breast MRI    Two enhancing masses in the upper-outer quadrant of the left breast with enlarged axillary adenopathy corresponding with the patient's known areas of invasive ductal carcinoma and DCIS as well as axillary metastasis.       02/07/2014 Clinical Stage    Stage IIB: T2 N1      02/22/2014 Procedure    [facility] panel and was found to have a pathogenic mutation in the BRCA1 gene called c.190T>G (p.Cys64Gly      02/22/2014 Procedure    Right breast core needle bx: negative for malignancy      02/25/2014 - 06/30/2014 Neo-Adjuvant Chemotherapy    Docetaxel, carboplatin, trastuzumab, and pertuzumab (3/26-7/29/15) followed by maintenance trastuzumab      06/30/2014 -  Chemotherapy    Maintenace trastuzumab.  Held 07/2014 due to decreased EF=  resumed 11/09/2014; continued decline; d/c'd 02/22/15; resumed 08/04/15 after discussion with Dr. Haroldine Laws      07/07/2014 Breast MRI    no remaining abnormal enhancement or mass seen the left breast. No suspicious findings on either side      07/28/2014 Definitive Surgery    Bilateral mastectomy/SLNB Barry Dienes) with implant placement: no evidence of residual malignancy in left breast;  negative in right breast. 3 Left axillary LN removed and no evidence of malignancy (0/3). two benign LN from right.      07/28/2014 Pathologic Stage    ypT0 ypN0      10/14/2014 Surgery    Removal of bilateral breast tissue expander with placement of bilateral breast implants ([facility])       02/08/2015 Surgery    Bilateral salpingo-oophorectomy and hysterectomy with benign pathology      03/31/2015 Surgery    Breast reconstruction revision with bilateral lipofilling ([facility])        INTERVAL HISTORY:  [name] presents to the [facility] today for routine follow-up for her history of breast cancer.  Overall, she reports feeling quite well.  She does report some arthritic pain in her left hip and right foot that is intermittent.  She has previously seen her PCP for this and has undergone imaging.  She is otherwise doing well.     REVIEW OF SYSTEMS:  Review of Systems  Constitutional: Negative for chills, diaphoresis, fever, malaise/fatigue and weight loss.  HENT: Negative for hearing loss and tinnitus.   Eyes: Negative for blurred vision and double vision.  Respiratory: Negative for cough and shortness of breath.   Cardiovascular: Negative for chest pain, palpitations and leg swelling.  Gastrointestinal: Negative for abdominal pain, blood in stool, constipation, diarrhea, heartburn, melena, nausea and vomiting.  Genitourinary: Negative for  dysuria.  Musculoskeletal: Negative for myalgias.  Skin: Negative for rash.  Neurological: Negative for dizziness, focal weakness, weakness and headaches.  Endo/Heme/Allergies: Negative for environmental allergies. Does not bruise/bleed easily.  Psychiatric/Behavioral: Negative for depression. The patient is not nervous/anxious.     Breast: Denies any new nodularity, masses, tenderness, nipple changes, or nipple discharge.        PAST MEDICAL/SURGICAL HISTORY:  Past Medical History:  Diagnosis Date  . Anemia   . BRCA1 positive     c.190T>G (p.Cys64Gly) @ [facility]   . Breast cancer ([place]) 02/01/14   ER-/PR-/Her2+, still receiving chemo 12/21/14, last Herceptin 02-02-15.Left breat cancer.  . Chest pain    pt states is secondary to bilateral mastectomy  . Depression   . DVT (deep venous thrombosis) ([facility])    Right Subclavian and IJ.. Lovenox stopped 01-10-15 until after surgery planned 02-08-15.  Marland Kitchen Dysrhythmia   . Endometriosis   . Fibroid tumor    02-02-15 remains with abdominal pain and some vaginal bleeding due to fibroids.  Marland Kitchen GERD (gastroesophageal reflux disease)   . History of blood transfusion    last 9'15. due to chemotherapy.  Marland Kitchen History of chemotherapy   . History of staph infection   . Hypertension   . Iron deficiency anemia, unspecified 02/25/2014  . PONV (postoperative nausea and vomiting)    needs scop patch  . Rash 02/22/2015  . Shortness of breath     when Hemoglobin low, now resolved after transfusions 9'15.; increased exertion as stated per pt during PAT visit 11/08/2016  . Wears glasses    Past Surgical History:  Procedure Laterality Date  . BILATERAL TOTAL MASTECTOMY WITH AXILLARY LYMPH NODE DISSECTION Bilateral 07/28/2014   Procedure: BILATERAL TOTAL MASTECTOMY WITH LEFT AXILLARY SENTINEL LYMPH NODE BIOPSY;  Surgeon: Stark Klein, MD;  Location: [facility];  Service: General;  Laterality: Bilateral;  . BREAST RECONSTRUCTION Bilateral 03/31/2015   Procedure: BILATERAL BREAST RECONSTRUCTION REVISION WITH BILATERAL LIPOFILLING;  Surgeon: Theodoro Kos, DO;  Location: [facility];  Service: Plastics;  Laterality: Bilateral;  . BREAST RECONSTRUCTION WITH PLACEMENT OF TISSUE EXPANDER AND FLEX HD (ACELLULAR HYDRATED DERMIS) Bilateral 07/28/2014   Procedure: BILATERAL BREAST RECONSTRUCTION WITH PLACEMENT OF TISSUE EXPANDER AND FLEX HD (ACELLULAR HYDRATED DERMIS);  Surgeon: Theodoro Kos, DO;  Location: [facility];  Service: Plastics;  Laterality: Bilateral;  . CARDIAC CATHETERIZATION     10'15- Dr. Sung Amabile  .  CERVICAL POLYPECTOMY  2010  . CESAREAN SECTION     one previous  . LEFT HEART CATHETERIZATION WITH CORONARY ANGIOGRAM N/A 09/13/2014   Procedure: LEFT HEART CATHETERIZATION WITH CORONARY ANGIOGRAM;  Surgeon: Jolaine Artist, MD;  Location: [facility];  Service: Cardiovascular;  Laterality: N/A;  . LIPOSUCTION WITH LIPOFILLING Bilateral 03/31/2015   Procedure: LIPOSUCTION WITH LIPOFILLING;  Surgeon: Theodoro Kos, DO;  Location: [facility];  Service: Plastics;  Laterality: Bilateral;  . MASTECTOMY    . MULTIPLE EXTRACTIONS WITH ALVEOLOPLASTY N/A 11/09/2016   Procedure: Extraction of tooth #'s 1,2,18,30, and 32 with alveoloplasty and gross debridement of remaining teeth.;  Surgeon: [name], DDS;  Location: [facility];  Service: Oral Surgery;  Laterality: N/A;  . PORT-A-CATH REMOVAL N/A 05/21/2014   Procedure: REMOVAL PORT-A-CATH;  Surgeon: Zenovia Jarred, MD;  Location: [facility];  Service: General;  Laterality: N/A;  . PORTACATH PLACEMENT Right 02/23/2014   Procedure: INSERTION PORT-A-CATH;  Surgeon: Joyice Faster. Cornett, MD;  Location: [facility];  Service: General;  Laterality: Right;  .  REMOVAL OF TISSUE EXPANDER AND PLACEMENT OF IMPLANT Bilateral 10/14/2014   Procedure: REMOVAL OF BILATERAL BREAST  TISSUE EXPANDER  WITH PLASCEMENT OF BILATERAL  BREAST IMPLANTS;  Surgeon: Theodoro Kos, DO;  Location: [facility];  Service: Plastics;  Laterality: Bilateral;  . ROBOTIC ASSISTED TOTAL HYSTERECTOMY WITH BILATERAL SALPINGO OOPHERECTOMY Bilateral 02/08/2015   Procedure: ROBOTIC ASSISTED TOTAL HYSTERECTOMY WITH BILATERAL SALPINGO OOPHORECTOMY; UTERUS WEIGHING GREATER THAN 250 GRAMS;  Surgeon: Everitt Amber, MD;  Location: [facility];  Service: Gynecology;  Laterality: Bilateral;  BRCA 1 GENE MUTATION  . TEE WITHOUT CARDIOVERSION N/A 05/26/2014   Procedure: TRANSESOPHAGEAL ECHOCARDIOGRAM (TEE);  Surgeon: Larey Dresser, MD;  Location: [facility];  Service:  Cardiovascular;  Laterality: N/A;  . TUBAL LIGATION       ALLERGIES:  Allergies  Allergen Reactions  . Compazine [Prochlorperazine Edisylate] Other (See Comments)    Stuttering     CURRENT MEDICATIONS:  Outpatient Encounter Prescriptions as of 02/05/2017  Medication Sig Note  . Biotin 5000 MCG TABS Take 10,000 mcg by mouth daily. Reported on 06/21/2016   . buPROPion (WELLBUTRIN) 100 MG tablet Take 1 tablet (100 mg total) by mouth 2 (two) times daily.   . carvedilol (COREG) 12.5 MG tablet Take 12.5 mg by mouth 2 (two) times daily as needed. For hypertension 11/08/2016: Pt states is currently not using; no prescription in home at present time;  . ferrous sulfate 325 (65 FE) MG tablet Take 1 tablet (325 mg total) by mouth 3 (three) times daily with meals.   Marland Kitchen oxyCODONE-acetaminophen (ROXICET) 5-325 MG tablet Take 1-2 tablets by mouth every 6 (six) hours as needed for severe pain.   . pantoprazole (PROTONIX) 40 MG tablet Take 40 mg by mouth daily as needed.    No facility-administered encounter medications on file as of 02/05/2017.      ONCOLOGIC FAMILY HISTORY:  Family History  Problem Relation Age of Onset  . Breast cancer Mother 58    currently 20  . Diabetes Father   . Pancreatic cancer Father 47  . Breast cancer Paternal Aunt 59    currently 99; BRCA1 positive  . Stroke Maternal Grandfather   . Cancer Paternal Aunt     unk. primary; deceased 91s  . Breast cancer Cousin     daughter of unaffected paternal aunt; dx in her 15s    GENETIC COUNSELING/TESTING: BRCA 1 positive  SOCIAL HISTORY:  Tamara [name] is divorced and lives with her teenage daughter in [place], [place].  She has 2 children who live in the area.  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  She denies any current or history of tobacco, alcohol, or illicit drug use.     PHYSICAL EXAMINATION:  Vital Signs: Vitals:   02/05/17 1220  BP: 139/87  Pulse: 72  Resp: 18  Temp: 98 F (36.7 C)   Filed  Weights   02/05/17 1220  Weight: 215 lb 12.8 oz (97.9 kg)   General: Well-nourished, well-appearing female in no acute distress.  Unaccompanied today.   HEENT: Head is normocephalic.  Pupils equal and reactive to light. Conjunctivae clear without exudate.  Sclerae anicteric. Oral mucosa is pink, moist.  Oropharynx is pink without lesions or erythema.  Lymph: No cervical, supraclavicular, or infraclavicular lymphadenopathy noted on palpation.  Cardiovascular: Regular rate and rhythm.Marland Kitchen Respiratory: Clear to auscultation bilaterally. Chest expansion symmetric; breathing non-labored.  Breast Exam:  Bilateral breasts are s/p mastectomy with bilateral implant placement, no swelling, nodularity, or masses noted.  No skin changes noted.   -Axilla: No axillary adenopathy bilaterally.  GI: Abdomen soft and round; non-tender, non-distended. Bowel sounds normoactive. No hepatosplenomegaly.   GU: Deferred.  Neuro: No focal deficits. Steady gait.  Psych: Mood and affect normal and appropriate for situation.  Extremities: No edema. Skin: Warm and dry.  LABORATORY DATA:  None for this visit   DIAGNOSTIC IMAGING:  MRI:      CT chest:     ASSESSMENT AND PLAN:  Ms.. Tamara [name] is a pleasant 46 y.o. BRCA 1 positive female with history of Stage IIB left breast invasive ductal carcinoma, ER-/PR-/HER2+, diagnosed in 2015, treated with neoadjuvant chemotherapy, Trastuzumab, and bilateral mastectomy.  [facility] for surveillance and routine follow-up.   1. History of breast cancer:  [name] is currently clinically and radiographically without evidence of disease or recurrence of breast cancer. She did have a pulmonary nodule on CT scan and follow up was recommended.  This was ordered for May, just before she sees Dr. Jana Hakim. She will return to the cancer center to see her medical oncologist, Dr. Jana Hakim, in 04/2017.  I encouraged her to call me with any questions or concerns  before her next visit at the cancer center, and I would be happy to see her sooner, if needed.    2. Hip and foot pain: This has been imaged by her pcp and it was negative.  I discussed that her weight is likely hurting her joints, and that she needs to exercise and eat healthier.  I gave her a handout on foods she can eat, and exercise tips.    3. Cancer screening:  Due to [name]'s history and her age, she should receive screening for skin cancers and colon cancer. She was encouraged to follow-up with her PCP for appropriate cancer screenings.   4. Health maintenance and wellness promotion: [name] was encouraged to consume 5-7 servings of fruits and vegetables per day. She was also encouraged to engage in moderate to vigorous exercise for 30 minutes per day most days of the week. She was instructed to limit her alcohol consumption and continue to abstain from tobacco use.    Dispo:  -Return to cancer center in May, 2018 for follow up with Dr. Jana Hakim.   A total of (30) minutes of face-to-face time was spent with this patient with greater than 50% of that time in counseling and care-coordination.   Charlestine Massed, NP Survivorship Program [facility] [phone]   Note: PRIMARY CARE PROVIDER Angelica Chessman, [place] [phone]

## 2017-02-26 ENCOUNTER — Other Ambulatory Visit: Payer: Self-pay | Admitting: *Deleted

## 2017-04-16 ENCOUNTER — Ambulatory Visit (HOSPITAL_COMMUNITY)
Admission: RE | Admit: 2017-04-16 | Discharge: 2017-04-16 | Disposition: A | Discharge status: Home / Self Care | Payer: Medicaid Other | Source: Ambulatory Visit | Attending: Adult Health | Admitting: Adult Health

## 2017-04-16 ENCOUNTER — Encounter (HOSPITAL_COMMUNITY): Payer: Self-pay

## 2017-04-16 DIAGNOSIS — R911 Solitary pulmonary nodule: Secondary | ICD-10-CM

## 2017-04-16 DIAGNOSIS — Z853 Personal history of malignant neoplasm of breast: Secondary | ICD-10-CM | POA: Diagnosis not present

## 2017-04-16 DIAGNOSIS — Z9882 Breast implant status: Secondary | ICD-10-CM | POA: Insufficient documentation

## 2017-04-16 DIAGNOSIS — Z9013 Acquired absence of bilateral breasts and nipples: Secondary | ICD-10-CM | POA: Insufficient documentation

## 2017-04-16 DIAGNOSIS — C50412 Malignant neoplasm of upper-outer quadrant of left female breast: Secondary | ICD-10-CM

## 2017-04-16 DIAGNOSIS — Z87891 Personal history of nicotine dependence: Secondary | ICD-10-CM | POA: Insufficient documentation

## 2017-04-16 DIAGNOSIS — Z171 Estrogen receptor negative status [ER-]: Secondary | ICD-10-CM

## 2017-04-16 MED ORDER — IOPAMIDOL (ISOVUE-300) INJECTION 61%
INTRAVENOUS | Status: AC
  Filled 2017-04-16: qty 75

## 2017-04-16 MED ORDER — IOPAMIDOL (ISOVUE-300) INJECTION 61%
75.0000 mL | Freq: Once | INTRAVENOUS | Status: AC | PRN
  Administered 2017-04-16: 75 mL via INTRAVENOUS

## 2017-04-17 ENCOUNTER — Other Ambulatory Visit: Payer: Self-pay | Admitting: *Deleted

## 2017-04-17 DIAGNOSIS — Z171 Estrogen receptor negative status [ER-]: Principal | ICD-10-CM

## 2017-04-17 DIAGNOSIS — C50412 Malignant neoplasm of upper-outer quadrant of left female breast: Secondary | ICD-10-CM

## 2017-04-18 ENCOUNTER — Other Ambulatory Visit: Payer: Medicaid Other

## 2017-04-18 ENCOUNTER — Ambulatory Visit: Payer: Medicaid Other | Admitting: Oncology

## 2017-05-30 ENCOUNTER — Other Ambulatory Visit: Payer: Self-pay | Admitting: Nurse Practitioner

## 2017-06-17 ENCOUNTER — Other Ambulatory Visit (HOSPITAL_BASED_OUTPATIENT_CLINIC_OR_DEPARTMENT_OTHER): Payer: Medicaid Other

## 2017-06-17 ENCOUNTER — Ambulatory Visit (HOSPITAL_BASED_OUTPATIENT_CLINIC_OR_DEPARTMENT_OTHER): Payer: Medicaid Other | Admitting: Oncology

## 2017-06-17 VITALS — BP 139/95 | HR 66 | Temp 97.8°F | Resp 20 | Ht 65.0 in | Wt 209.6 lb

## 2017-06-17 DIAGNOSIS — Z171 Estrogen receptor negative status [ER-]: Principal | ICD-10-CM

## 2017-06-17 DIAGNOSIS — Z853 Personal history of malignant neoplasm of breast: Secondary | ICD-10-CM

## 2017-06-17 DIAGNOSIS — H538 Other visual disturbances: Secondary | ICD-10-CM

## 2017-06-17 DIAGNOSIS — R911 Solitary pulmonary nodule: Secondary | ICD-10-CM | POA: Diagnosis present

## 2017-06-17 DIAGNOSIS — N951 Menopausal and female climacteric states: Secondary | ICD-10-CM | POA: Diagnosis not present

## 2017-06-17 DIAGNOSIS — Z1501 Genetic susceptibility to malignant neoplasm of breast: Secondary | ICD-10-CM

## 2017-06-17 DIAGNOSIS — C50412 Malignant neoplasm of upper-outer quadrant of left female breast: Secondary | ICD-10-CM

## 2017-06-17 DIAGNOSIS — Z86718 Personal history of other venous thrombosis and embolism: Secondary | ICD-10-CM | POA: Diagnosis not present

## 2017-06-17 DIAGNOSIS — C50912 Malignant neoplasm of unspecified site of left female breast: Secondary | ICD-10-CM

## 2017-06-17 LAB — CBC WITH DIFFERENTIAL/PLATELET
BASO%: 0.1 % (ref 0.0–2.0)
Basophils Absolute: 0 10*3/uL (ref 0.0–0.1)
EOS ABS: 0.1 10*3/uL (ref 0.0–0.5)
EOS%: 1.3 % (ref 0.0–7.0)
HCT: 38.6 % (ref 34.8–46.6)
HGB: 12.4 g/dL (ref 11.6–15.9)
LYMPH%: 32.6 % (ref 14.0–49.7)
MCH: 28.9 pg (ref 25.1–34.0)
MCHC: 32.1 g/dL (ref 31.5–36.0)
MCV: 90 fL (ref 79.5–101.0)
MONO#: 0.6 10*3/uL (ref 0.1–0.9)
MONO%: 7.2 % (ref 0.0–14.0)
NEUT#: 5.1 10*3/uL (ref 1.5–6.5)
NEUT%: 58.8 % (ref 38.4–76.8)
PLATELETS: 297 10*3/uL (ref 145–400)
RBC: 4.29 10*6/uL (ref 3.70–5.45)
RDW: 12.5 % (ref 11.2–14.5)
WBC: 8.7 10*3/uL (ref 3.9–10.3)
lymph#: 2.8 10*3/uL (ref 0.9–3.3)

## 2017-06-17 LAB — COMPREHENSIVE METABOLIC PANEL
ALT: 17 U/L (ref 0–55)
AST: 16 U/L (ref 5–34)
Albumin: 4.1 g/dL (ref 3.5–5.0)
Alkaline Phosphatase: 89 U/L (ref 40–150)
Anion Gap: 9 mEq/L (ref 3–11)
BUN: 12.8 mg/dL (ref 7.0–26.0)
CHLORIDE: 106 meq/L (ref 98–109)
CO2: 26 meq/L (ref 22–29)
Calcium: 10 mg/dL (ref 8.4–10.4)
Creatinine: 1 mg/dL (ref 0.6–1.1)
EGFR: 81 mL/min/{1.73_m2} — AB (ref >90)
Glucose: 90 mg/dl (ref 70–140)
Potassium: 4 mEq/L (ref 3.5–5.1)
Sodium: 141 mEq/L (ref 136–145)
Total Bilirubin: 0.36 mg/dL (ref 0.20–1.20)
Total Protein: 7.2 g/dL (ref 6.4–8.3)

## 2017-06-17 NOTE — Progress Notes (Signed)
ID: Tamara [name] OB: 03/05/1971  MR#: [id number]  CSN#:[id number]  PCP: Tresa Garter, MD GYN:   Tamara: Dr. Erroll Luna OTHER MD: Dr. 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 There was also an 8 mm mass 8 cm from the nipple in the ipsilateral breast, as well as positive lymph nodes. 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 MRI confirmed 2.8 and 1.3 cm mass is as well as lymph node. On the right a 6 mm nodule, biopsied and negative for malignancy. PET scan 9-38-10 had hypermetabolic left breast mass consistent with known neoplasm and FDG positive left axillary lymph nodes,benign-appearing brown fat activity and muscular activity in the  neck and chest but no findings for metastatic disease involving the neck, chest, abdomen, pelvis or bones. Moderate FDG activity in the endometrial canal is thought likely due to secretory phase of ovulation or menses. No mass, uterine fibroids present. CT CAP 02-19-14 had 3 cm left breast mass, enlarged left axillary lymph nodes are positive and no CT findings for metastatic disease involving the chest,abdomen or pelvis and no evidence of osseous metastatic disease. Mildly enlarged fibroid uterus."   Her subsequent treatment is as detailed below   INTERVAL HISTORY:   Adriane returns today for follow-up of her estrogen  receptor negative breast cancer. Interval history is generally unremarkable. She is going to the gym about 3 times a week. Her son "came back from college with a child". The child is currently at her home because the child's mother and the child's mother's sister have had an altercation. This is very stressful.  The insurance problems walking revision of her breast reconstruction have not been resolved and she remains dissatisfied with the reconstruction but unable to proceed to a ReVision.  REVIEW OF SYSTEMS: She has some aches and pains here and there particularly in her hips when the weather changes she says, but this is very inconstant. She thinks she needs to get her eyes checked because her vision is getting a little bit blurry. She has problems with her gums. She still has hot flashes. The hair on top of her head is very thin and she says. A detailed review of systems today was otherwise stable  PAST MEDICAL HISTORY: Past Medical History:  Diagnosis Date  . Anemia   . BRCA1 positive    c.190T>G (p.Cys64Gly) @ [facility]   . Breast cancer ([place]) 02/01/14   ER-/PR-/Her2+, still receiving chemo 12/21/14, last Herceptin 02-02-15.Left breat cancer.  . Chest pain    pt states is secondary to bilateral mastectomy  . Depression   . DVT (deep venous thrombosis) ([facility])    Right Subclavian and IJ.. Lovenox stopped 01-10-15 until after surgery planned 02-08-15.  Marland Kitchen Dysrhythmia   . Endometriosis   . Fibroid tumor    02-02-15 remains with abdominal pain and some vaginal bleeding due to fibroids.  Marland Kitchen GERD (gastroesophageal reflux disease)   . History of blood transfusion  last 9'15. due to chemotherapy.  Marland Kitchen History of chemotherapy   . History of staph infection   . Hypertension   . Iron deficiency anemia, unspecified 02/25/2014  . PONV (postoperative nausea and vomiting)    needs scop patch  . Rash 02/22/2015  . Shortness of breath     when Hemoglobin low, now resolved after transfusions 9'15.; increased  exertion as stated per pt during PAT visit 11/08/2016  . Wears glasses     PAST SURGICAL HISTORY: Past Surgical History:  Procedure Laterality Date  . BILATERAL TOTAL MASTECTOMY WITH AXILLARY LYMPH NODE DISSECTION Bilateral 07/28/2014   Procedure: BILATERAL TOTAL MASTECTOMY WITH LEFT AXILLARY SENTINEL LYMPH NODE BIOPSY;  Surgeon: Stark Klein, MD;  Location: [place];  Service: General;  Laterality: Bilateral;  . BREAST RECONSTRUCTION Bilateral 03/31/2015   Procedure: BILATERAL BREAST RECONSTRUCTION REVISION WITH BILATERAL LIPOFILLING;  Surgeon: Theodoro Kos, DO;  Location: [place];  Service: Plastics;  Laterality: Bilateral;  . BREAST RECONSTRUCTION WITH PLACEMENT OF TISSUE EXPANDER AND FLEX HD (ACELLULAR HYDRATED DERMIS) Bilateral 07/28/2014   Procedure: BILATERAL BREAST RECONSTRUCTION WITH PLACEMENT OF TISSUE EXPANDER AND FLEX HD (ACELLULAR HYDRATED DERMIS);  Surgeon: Theodoro Kos, DO;  Location: [place];  Service: Plastics;  Laterality: Bilateral;  . CARDIAC CATHETERIZATION     10'15- Dr. Sung Amabile  . CERVICAL POLYPECTOMY  2010  . CESAREAN SECTION     one previous  . LEFT HEART CATHETERIZATION WITH CORONARY ANGIOGRAM N/A 09/13/2014   Procedure: LEFT HEART CATHETERIZATION WITH CORONARY ANGIOGRAM;  Surgeon: Jolaine Artist, MD;  Location: [facility] CATH LAB;  Service: Cardiovascular;  Laterality: N/A;  . LIPOSUCTION WITH LIPOFILLING Bilateral 03/31/2015   Procedure: LIPOSUCTION WITH LIPOFILLING;  Surgeon: Theodoro Kos, DO;  Location: [place];  Service: Plastics;  Laterality: Bilateral;  . MASTECTOMY    . MULTIPLE EXTRACTIONS WITH ALVEOLOPLASTY N/A 11/09/2016   Procedure: Extraction of tooth #'s 1,2,18,30, and 32 with alveoloplasty and gross debridement of remaining teeth.;  Surgeon: [name], DDS;  Location: [facility];  Service: Oral Surgery;  Laterality: N/A;  . PORT-A-CATH REMOVAL N/A 05/21/2014   Procedure: REMOVAL PORT-A-CATH;  Surgeon: Zenovia Jarred,  MD;  Location: [place];  Service: General;  Laterality: N/A;  . PORTACATH PLACEMENT Right 02/23/2014   Procedure: INSERTION PORT-A-CATH;  Surgeon: Joyice Faster. Cornett, MD;  Location: [place];  Service: General;  Laterality: Right;  . REMOVAL OF TISSUE EXPANDER AND PLACEMENT OF IMPLANT Bilateral 10/14/2014   Procedure: REMOVAL OF BILATERAL BREAST  TISSUE EXPANDER  WITH PLASCEMENT OF BILATERAL  BREAST IMPLANTS;  Surgeon: Theodoro Kos, DO;  Location: [place];  Service: Plastics;  Laterality: Bilateral;  . ROBOTIC ASSISTED TOTAL HYSTERECTOMY WITH BILATERAL SALPINGO OOPHERECTOMY Bilateral 02/08/2015   Procedure: ROBOTIC ASSISTED TOTAL HYSTERECTOMY WITH BILATERAL SALPINGO OOPHORECTOMY; UTERUS WEIGHING GREATER THAN 250 GRAMS;  Surgeon: Everitt Amber, MD;  Location: [facility];  Service: Gynecology;  Laterality: Bilateral;  BRCA 1 GENE MUTATION  . TEE WITHOUT CARDIOVERSION N/A 05/26/2014   Procedure: TRANSESOPHAGEAL ECHOCARDIOGRAM (TEE);  Surgeon: Larey Dresser, MD;  Location: [place];  Service: Cardiovascular;  Laterality: N/A;  . TUBAL LIGATION      FAMILY HISTORY Family History  Problem Relation Age of Onset  . Breast cancer Mother 91       currently 25  . Diabetes Father   . Pancreatic cancer Father 53  . Breast cancer Paternal Aunt 22       currently 23; BRCA1 positive  .  Stroke Maternal Grandfather   . Cancer Paternal Aunt        unk. primary; deceased 63s  . Breast cancer Cousin        daughter of unaffected paternal aunt; dx in her 64s  [name] father died from prostate cancer the age of 58. [name] mother was diagnosed with breast cancer the age of 47. [name] father had 5 sisters, 3 of whom were diagnosed with breast cancer, 2 of them before the age of 65. The patient had one brother, no sisters. There is no history of ovarian cancer in the family.  GYNECOLOGIC HISTORY:  Menarche age 23, first live birth age 25, the patient is [place] P5. Her  periods stopped at the time of chemotherapy. 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 Her (former) husband [name] works as an Animal nutritionist. Their divorce was finalized September 2016. [name] oldest child, a son, [name], is studying Therapist, occupational; the patient is a 66 year old daughter [name] is also in college. [name] younger children are 56, 46, and 52. The patient attends a [employer]   ADVANCED DIRECTIVES: Not in place; at the 12/15/2015 visit the patient was given the appropriate forms to complete and notarize at her discretion   HEALTH MAINTENANCE: Social History  Substance Use Topics  . Smoking status: Former Smoker    Packs/day: 0.25    Years: 12.00    Types: Cigarettes    Quit date: 02/18/2009  . Smokeless tobacco: Never Used  . Alcohol use No   Colonoscopy: Bone Density Scan:  Pap Smear:  Eye Exam:  Vitamin D Level:   Lipid Panel:    Allergies  Allergen Reactions  . Compazine [Prochlorperazine Edisylate] Other (See Comments)    Stuttering    Current Outpatient Prescriptions  Medication Sig Dispense Refill  . Biotin 5000 MCG TABS Take 10,000 mcg by mouth daily. Reported on 06/21/2016    . buPROPion (WELLBUTRIN) 100 MG tablet Take 1 tablet (100 mg total) by mouth 2 (two) times daily. 60 tablet 3  . carvedilol (COREG) 12.5 MG tablet Take 12.5 mg by mouth 2 (two) times daily as needed. For hypertension    . ferrous sulfate 325 (65 FE) MG tablet Take 1 tablet (325 mg total) by mouth 3 (three) times daily with meals. 270 tablet 3  . oxyCODONE-acetaminophen (ROXICET) 5-325 MG tablet Take 1-2 tablets by mouth every 6 (six) hours as needed for severe pain. 32 tablet 0  . pantoprazole (PROTONIX) 40 MG tablet Take 40 mg by mouth daily as needed.     No current facility-administered medications for this visit.     OBJECTIVE:  Young-appearing African American womanWho appears well  Vitals:   06/17/17 1051  BP: (!) 139/95  Pulse: 66  Resp: 20  Temp: 97.8 F (36.6 C)     Body mass index is 34.88 kg/m.      ECOG FS:0 - Asymptomatic  Sclerae unicteric, pupils round and equal Oropharynx clear and moist No cervical or supraclavicular adenopathy Lungs no rales or rhonchi Heart regular rate and rhythm Abd soft, nontender, positive bowel sounds MSK no focal spinal tenderness, no upper extremity lymphedema Neuro: nonfocal, well oriented, appropriate affect Breasts: She status post bilateral mastectomies with bilateral reconstruction. The end result is not symmetrical and she is not pleased with it. There is no evidence of local recurrence. Both axillae are benign.  LAB RESULTS:  CMP     Component Value Date/Time   NA 145 10/16/2016 1043   K 4.4 10/16/2016 1043   CL 107 04/23/2016 1353   CO2 27 10/16/2016 1043   GLUCOSE 96 10/16/2016 1043   BUN 16.3 10/16/2016 1043   CREATININE 1.0 10/16/2016 1043   CALCIUM 9.9 10/16/2016 1043   PROT 7.5 10/16/2016 1043   ALBUMIN 3.9 10/16/2016 1043   AST 17 10/16/2016 1043   ALT 19 10/16/2016 1043   ALKPHOS 110 10/16/2016 1043   BILITOT 0.27 10/16/2016 1043   GFRNONAA >60 04/23/2016 1353   GFRAA >60 04/23/2016 1353    I No results found for: SPEP  Lab Results  Component Value Date   WBC 8.7 06/17/2017   NEUTROABS 5.1 06/17/2017   HGB 12.4 06/17/2017   HCT 38.6 06/17/2017   MCV 90.0 06/17/2017   PLT 297 06/17/2017        Chemistry      Component Value Date/Time   NA 145 10/16/2016 1043   K 4.4 10/16/2016 1043   CL 107 04/23/2016 1353   CO2 27 10/16/2016 1043   BUN 16.3 10/16/2016 1043   CREATININE 1.0 10/16/2016 1043      Component Value Date/Time   CALCIUM 9.9 10/16/2016 1043   ALKPHOS 110 10/16/2016 1043   AST 17 10/16/2016 1043   ALT 19 10/16/2016 1043   BILITOT 0.27 10/16/2016 1043       No results found for: LABCA2  No  components found for: LABCA125  No results for input(s): INR in the last 168 hours.  Urinalysis    Component Value Date/Time   COLORURINE RED (A) 02/02/2015 1030   APPEARANCEUR CLOUDY (A) 02/02/2015 1030   LABSPEC 1.025 02/16/2015 1243   PHURINE 6.0 02/16/2015 1243   PHURINE 6.0 02/02/2015 1030   GLUCOSEU Negative 02/16/2015 1243   HGBUR Negative 02/16/2015 1243   HGBUR LARGE (A) 02/02/2015 1030   BILIRUBINUR negative 06/21/2016 1120   BILIRUBINUR Negative 02/16/2015 1243   KETONESUR negative 06/21/2016 1120   KETONESUR 5 02/16/2015 1243   KETONESUR NEGATIVE 02/02/2015 1030   PROTEINUR negative 06/21/2016 1120   PROTEINUR < 30 02/16/2015 1243   PROTEINUR 30 (A) 02/02/2015 1030   UROBILINOGEN 0.2 06/21/2016 1120   UROBILINOGEN 0.2 02/16/2015 1243   NITRITE Negative 06/21/2016 1120   NITRITE Negative 02/16/2015 1243   NITRITE NEGATIVE 02/02/2015 1030   LEUKOCYTESUR Negative 06/21/2016 1120   LEUKOCYTESUR Moderate 02/16/2015 1243    STUDIES: Chest CT with contrast 04/16/2017 obtained for evaluation of shortness of breath showed no evidence of malignancy. A 6 mm right middle lobe and a 7 mm left lower lobe nodule were stable over a period of greater than 2 years, indicating they are benign and require no further follow-up.  Bilateral breast MRI 09/06/2016 showed no evidence of malignancy but there were 2 asymmetric left internal mammary chain nodes which are of uncertain significance, most likely reactive. Further follow-up was suggested.  Assessment: 46 y.o. BRCA-1 positive [place] woman  1. S/p left breast upper outer quadrant biopsy of two separate breast masses and one axillary lymph node 01/29/2014 for a , clinical mT2 N1 stage IIB, invasive ductal carcinoma, grade 2-3,  estrogen and progesterone receptor negative, with an MIB-1 between 79-100%, and HER 2 amplified  2. completed 6 cycles of carboplatin, docetaxel, trastuzumab and pertuzumab 06/30/2014 with MRI 07/07/2014  showing a complete radiologic response  3. trastuzumab was to be continued to complete a year (through March 2016);  however echocardiogram 07/05/2014 showed an ejection fraction of 45-50%-- trastuzumab was held after 06/30/2014 dose until EF recovery, resumed 11/09/2014  (a) cath report from 09/13/2014 shows normal coronaries and a normal left ventricular function with an ejection fraction of 55%.  (b) echo 03/01/2015 suggest continuing mild cardiomyopathy: Trastuzumab discontinued--final dose 02/22/2015  (c) echocardiogram 01/10/2016 shows an ejection fraction of 45-50%  4. 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   5. MRSA port infection and septicemia mid-June 2015;  Port was removed, and  PICC line placed, completed antibiotic therapy with ANCEF; PICC subsequently pulled   6 genetic testing with the [facility] panel and was found to have a pathogenic mutation in the BRCA1 gene called c.190T>G (p.Cys64Gly  7. status post bilateral mastectomies and left axillary sentinel lymph node biopsy, withimmediate expander placement, on 07/28/14; the pathology ([id number]) showed a complete pathologic response in the left breast and the 3 sentinel lymph nodes sampled; the right breast was benign  (a) bilateral breast reconstruction revision 03/31/2015  8. Bilateral salpingo-oophorectomy and hysterectomy 02/08/15 with benign pathology  PLAN:  Jakhia is now 3 years out from definitive surgery for her breast cancer with no evidence of disease recurrence. This is very favorable.  She has multiple life stressors but is managing them well at this point.  She is hopeful to get her insurance straightened out so that she can have a revision of her right reconstructed breasts. Until then however she is having no symptoms related to that and there is no evidence of disease recurrence  She will see me again in 6 months. She knows to call for any problems that may  develop before that visit. [name], MD 06/17/2017 11:11 AM

## 2017-07-17 ENCOUNTER — Encounter: Payer: Self-pay | Admitting: Internal Medicine

## 2017-07-17 ENCOUNTER — Other Ambulatory Visit (HOSPITAL_COMMUNITY): Payer: Self-pay | Admitting: Dentistry

## 2017-07-18 NOTE — Telephone Encounter (Signed)
Please advise on OV or advice.

## 2017-08-03 ENCOUNTER — Other Ambulatory Visit: Payer: Self-pay | Admitting: Nurse Practitioner

## 2017-08-07 ENCOUNTER — Ambulatory Visit: Payer: Medicaid Other | Attending: Internal Medicine | Admitting: Internal Medicine

## 2017-08-07 ENCOUNTER — Encounter: Payer: Self-pay | Admitting: Internal Medicine

## 2017-08-07 VITALS — BP 142/90 | HR 66 | Temp 98.6°F | Resp 18 | Ht 65.0 in | Wt 211.8 lb

## 2017-08-07 DIAGNOSIS — Z86718 Personal history of other venous thrombosis and embolism: Secondary | ICD-10-CM | POA: Insufficient documentation

## 2017-08-07 DIAGNOSIS — I10 Essential (primary) hypertension: Secondary | ICD-10-CM | POA: Diagnosis not present

## 2017-08-07 DIAGNOSIS — Z90722 Acquired absence of ovaries, bilateral: Secondary | ICD-10-CM | POA: Diagnosis not present

## 2017-08-07 DIAGNOSIS — Z853 Personal history of malignant neoplasm of breast: Secondary | ICD-10-CM | POA: Diagnosis not present

## 2017-08-07 DIAGNOSIS — K219 Gastro-esophageal reflux disease without esophagitis: Secondary | ICD-10-CM | POA: Diagnosis not present

## 2017-08-07 DIAGNOSIS — Z9013 Acquired absence of bilateral breasts and nipples: Secondary | ICD-10-CM | POA: Insufficient documentation

## 2017-08-07 DIAGNOSIS — Z888 Allergy status to other drugs, medicaments and biological substances status: Secondary | ICD-10-CM | POA: Diagnosis not present

## 2017-08-07 DIAGNOSIS — Z923 Personal history of irradiation: Secondary | ICD-10-CM | POA: Insufficient documentation

## 2017-08-07 DIAGNOSIS — F329 Major depressive disorder, single episode, unspecified: Secondary | ICD-10-CM | POA: Insufficient documentation

## 2017-08-07 DIAGNOSIS — Z9221 Personal history of antineoplastic chemotherapy: Secondary | ICD-10-CM | POA: Insufficient documentation

## 2017-08-07 DIAGNOSIS — R0781 Pleurodynia: Secondary | ICD-10-CM | POA: Diagnosis not present

## 2017-08-07 DIAGNOSIS — R21 Rash and other nonspecific skin eruption: Secondary | ICD-10-CM | POA: Diagnosis not present

## 2017-08-07 DIAGNOSIS — Z90710 Acquired absence of both cervix and uterus: Secondary | ICD-10-CM | POA: Insufficient documentation

## 2017-08-07 DIAGNOSIS — Z8619 Personal history of other infectious and parasitic diseases: Secondary | ICD-10-CM | POA: Insufficient documentation

## 2017-08-07 MED ORDER — PREDNISONE 20 MG PO TABS: 20.0000 mg | ORAL_TABLET | Freq: Every day | ORAL | 0 refills | Status: DC

## 2017-08-07 MED ORDER — LIDOCAINE 5 % EX OINT: 1.0000 "application " | TOPICAL_OINTMENT | Freq: Every day | CUTANEOUS | 2 refills | Status: DC | PRN

## 2017-08-07 MED ORDER — TRIAMCINOLONE ACETONIDE 0.5 % EX OINT: 1.0000 "application " | TOPICAL_OINTMENT | Freq: Two times a day (BID) | CUTANEOUS | 1 refills | Status: DC

## 2017-08-07 NOTE — Patient Instructions (Signed)
DASH Eating Plan DASH stands for "Dietary Approaches to Stop Hypertension." The DASH eating plan is a healthy eating plan that has been shown to reduce high blood pressure (hypertension). It may also reduce your risk for type 2 diabetes, heart disease, and stroke. The DASH eating plan may also help with weight loss. What are tips for following this plan? General guidelines  Avoid eating more than 2,300 mg (milligrams) of salt (sodium) a day. If you have hypertension, you may need to reduce your sodium intake to 1,500 mg a day.  Limit alcohol intake to no more than 1 drink a day for nonpregnant women and 2 drinks a day for men. One drink equals 12 oz of beer, 5 oz of wine, or 1 oz of hard liquor.  Work with your health care provider to maintain a healthy body weight or to lose weight. Ask what an ideal weight is for you.  Get at least 30 minutes of exercise that causes your heart to beat faster (aerobic exercise) most days of the week. Activities may include walking, swimming, or biking.  Work with your health care provider or diet and nutrition specialist (dietitian) to adjust your eating plan to your individual calorie needs. Reading food labels  Check food labels for the amount of sodium per serving. Choose foods with less than 5 percent of the Daily Value of sodium. Generally, foods with less than 300 mg of sodium per serving fit into this eating plan.  To find whole grains, look for the word "whole" as the first word in the ingredient list. Shopping  Buy products labeled as "low-sodium" or "no salt added."  Buy fresh foods. Avoid canned foods and premade or frozen meals. Cooking  Avoid adding salt when cooking. Use salt-free seasonings or herbs instead of table salt or sea salt. Check with your health care provider or pharmacist before using salt substitutes.  Do not fry foods. Cook foods using healthy methods such as baking, boiling, grilling, and broiling instead.  Cook with  heart-healthy oils, such as olive, canola, soybean, or sunflower oil. Meal planning   Eat a balanced diet that includes: ? 5 or more servings of fruits and vegetables each day. At each meal, try to fill half of your plate with fruits and vegetables. ? Up to 6-8 servings of whole grains each day. ? Less than 6 oz of lean meat, poultry, or fish each day. A 3-oz serving of meat is about the same size as a deck of cards. One egg equals 1 oz. ? 2 servings of low-fat dairy each day. ? A serving of nuts, seeds, or beans 5 times each week. ? Heart-healthy fats. Healthy fats called Omega-3 fatty acids are found in foods such as flaxseeds and coldwater fish, like sardines, salmon, and mackerel.  Limit how much you eat of the following: ? Canned or prepackaged foods. ? Food that is high in trans fat, such as fried foods. ? Food that is high in saturated fat, such as fatty meat. ? Sweets, desserts, sugary drinks, and other foods with added sugar. ? Full-fat dairy products.  Do not salt foods before eating.  Try to eat at least 2 vegetarian meals each week.  Eat more home-cooked food and less restaurant, buffet, and fast food.  When eating at a restaurant, ask that your food be prepared with less salt or no salt, if possible. What foods are recommended? The items listed may not be a complete list. Talk with your dietitian about what   dietary choices are best for you. Grains Whole-grain or whole-wheat bread. Whole-grain or whole-wheat pasta. Brown rice. Oatmeal. Quinoa. Bulgur. Whole-grain and low-sodium cereals. Pita bread. Low-fat, low-sodium crackers. Whole-wheat flour tortillas. Vegetables Fresh or frozen vegetables (raw, steamed, roasted, or grilled). Low-sodium or reduced-sodium tomato and vegetable juice. Low-sodium or reduced-sodium tomato sauce and tomato paste. Low-sodium or reduced-sodium canned vegetables. Fruits All fresh, dried, or frozen fruit. Canned fruit in natural juice (without  added sugar). Meat and other protein foods Skinless chicken or turkey. Ground chicken or turkey. Pork with fat trimmed off. Fish and seafood. Egg whites. Dried beans, peas, or lentils. Unsalted nuts, nut butters, and seeds. Unsalted canned beans. Lean cuts of beef with fat trimmed off. Low-sodium, lean deli meat. Dairy Low-fat (1%) or fat-free (skim) milk. Fat-free, low-fat, or reduced-fat cheeses. Nonfat, low-sodium ricotta or cottage cheese. Low-fat or nonfat yogurt. Low-fat, low-sodium cheese. Fats and oils Soft margarine without trans fats. Vegetable oil. Low-fat, reduced-fat, or light mayonnaise and salad dressings (reduced-sodium). Canola, safflower, olive, soybean, and sunflower oils. Avocado. Seasoning and other foods Herbs. Spices. Seasoning mixes without salt. Unsalted popcorn and pretzels. Fat-free sweets. What foods are not recommended? The items listed may not be a complete list. Talk with your dietitian about what dietary choices are best for you. Grains Baked goods made with fat, such as croissants, muffins, or some breads. Dry pasta or rice meal packs. Vegetables Creamed or fried vegetables. Vegetables in a cheese sauce. Regular canned vegetables (not low-sodium or reduced-sodium). Regular canned tomato sauce and paste (not low-sodium or reduced-sodium). Regular tomato and vegetable juice (not low-sodium or reduced-sodium). Pickles. Olives. Fruits Canned fruit in a light or heavy syrup. Fried fruit. Fruit in cream or butter sauce. Meat and other protein foods Fatty cuts of meat. Ribs. Fried meat. Bacon. Sausage. Bologna and other processed lunch meats. Salami. Fatback. Hotdogs. Bratwurst. Salted nuts and seeds. Canned beans with added salt. Canned or smoked fish. Whole eggs or egg yolks. Chicken or turkey with skin. Dairy Whole or 2% milk, cream, and half-and-half. Whole or full-fat cream cheese. Whole-fat or sweetened yogurt. Full-fat cheese. Nondairy creamers. Whipped toppings.  Processed cheese and cheese spreads. Fats and oils Butter. Stick margarine. Lard. Shortening. Ghee. Bacon fat. Tropical oils, such as coconut, palm kernel, or palm oil. Seasoning and other foods Salted popcorn and pretzels. Onion salt, garlic salt, seasoned salt, table salt, and sea salt. Worcestershire sauce. Tartar sauce. Barbecue sauce. Teriyaki sauce. Soy sauce, including reduced-sodium. Steak sauce. Canned and packaged gravies. Fish sauce. Oyster sauce. Cocktail sauce. Horseradish that you find on the shelf. Ketchup. Mustard. Meat flavorings and tenderizers. Bouillon cubes. Hot sauce and Tabasco sauce. Premade or packaged marinades. Premade or packaged taco seasonings. Relishes. Regular salad dressings. Where to find more information:  National Heart, Lung, and Blood Institute: www.nhlbi.nih.gov  American Heart Association: www.heart.org Summary  The DASH eating plan is a healthy eating plan that has been shown to reduce high blood pressure (hypertension). It may also reduce your risk for type 2 diabetes, heart disease, and stroke.  With the DASH eating plan, you should limit salt (sodium) intake to 2,300 mg a day. If you have hypertension, you may need to reduce your sodium intake to 1,500 mg a day.  When on the DASH eating plan, aim to eat more fresh fruits and vegetables, whole grains, lean proteins, low-fat dairy, and heart-healthy fats.  Work with your health care provider or diet and nutrition specialist (dietitian) to adjust your eating plan to your individual   calorie needs. This information is not intended to replace advice given to you by your health care provider. Make sure you discuss any questions you have with your health care provider. Document Released: 11/08/2011 Document Revised: 11/12/2016 Document Reviewed: 11/12/2016 Elsevier Interactive Patient Education  2017 Elsevier Inc. Hypertension Hypertension, commonly called high blood pressure, is when the force of blood  pumping through the arteries is too strong. The arteries are the blood vessels that carry blood from the heart throughout the body. Hypertension forces the heart to work harder to pump blood and may cause arteries to become narrow or stiff. Having untreated or uncontrolled hypertension can cause heart attacks, strokes, kidney disease, and other problems. A blood pressure reading consists of a higher number over a lower number. Ideally, your blood pressure should be below 120/80. The first ("top") number is called the systolic pressure. It is a measure of the pressure in your arteries as your heart beats. The second ("bottom") number is called the diastolic pressure. It is a measure of the pressure in your arteries as the heart relaxes. What are the causes? The cause of this condition is not known. What increases the risk? Some risk factors for high blood pressure are under your control. Others are not. Factors you can change  Smoking.  Having type 2 diabetes mellitus, high cholesterol, or both.  Not getting enough exercise or physical activity.  Being overweight.  Having too much fat, sugar, calories, or salt (sodium) in your diet.  Drinking too much alcohol. Factors that are difficult or impossible to change  Having chronic kidney disease.  Having a family history of high blood pressure.  Age. Risk increases with age.  Race. You may be at higher risk if you are African-American.  Gender. Men are at higher risk than women before age 45. After age 65, women are at higher risk than men.  Having obstructive sleep apnea.  Stress. What are the signs or symptoms? Extremely high blood pressure (hypertensive crisis) may cause:  Headache.  Anxiety.  Shortness of breath.  Nosebleed.  Nausea and vomiting.  Severe chest pain.  Jerky movements you cannot control (seizures).  How is this diagnosed? This condition is diagnosed by measuring your blood pressure while you are  seated, with your arm resting on a surface. The cuff of the blood pressure monitor will be placed directly against the skin of your upper arm at the level of your heart. It should be measured at least twice using the same arm. Certain conditions can cause a difference in blood pressure between your right and left arms. Certain factors can cause blood pressure readings to be lower or higher than normal (elevated) for a short period of time:  When your blood pressure is higher when you are in a health care provider's office than when you are at home, this is called white coat hypertension. Most people with this condition do not need medicines.  When your blood pressure is higher at home than when you are in a health care provider's office, this is called masked hypertension. Most people with this condition may need medicines to control blood pressure.  If you have a high blood pressure reading during one visit or you have normal blood pressure with other risk factors:  You may be asked to return on a different day to have your blood pressure checked again.  You may be asked to monitor your blood pressure at home for 1 week or longer.  If you are diagnosed   with hypertension, you may have other blood or imaging tests to help your health care provider understand your overall risk for other conditions. How is this treated? This condition is treated by making healthy lifestyle changes, such as eating healthy foods, exercising more, and reducing your alcohol intake. Your health care provider may prescribe medicine if lifestyle changes are not enough to get your blood pressure under control, and if:  Your systolic blood pressure is above 130.  Your diastolic blood pressure is above 80.  Your personal target blood pressure may vary depending on your medical conditions, your age, and other factors. Follow these instructions at home: Eating and drinking  Eat a diet that is high in fiber and potassium,  and low in sodium, added sugar, and fat. An example eating plan is called the DASH (Dietary Approaches to Stop Hypertension) diet. To eat this way: ? Eat plenty of fresh fruits and vegetables. Try to fill half of your plate at each meal with fruits and vegetables. ? Eat whole grains, such as whole wheat pasta, brown rice, or whole grain bread. Fill about one quarter of your plate with whole grains. ? Eat or drink low-fat dairy products, such as skim milk or low-fat yogurt. ? Avoid fatty cuts of meat, processed or cured meats, and poultry with skin. Fill about one quarter of your plate with lean proteins, such as fish, chicken without skin, beans, eggs, and tofu. ? Avoid premade and processed foods. These tend to be higher in sodium, added sugar, and fat.  Reduce your daily sodium intake. Most people with hypertension should eat less than 1,500 mg of sodium a day.  Limit alcohol intake to no more than 1 drink a day for nonpregnant women and 2 drinks a day for men. One drink equals 12 oz of beer, 5 oz of wine, or 1 oz of hard liquor. Lifestyle  Work with your health care provider to maintain a healthy body weight or to lose weight. Ask what an ideal weight is for you.  Get at least 30 minutes of exercise that causes your heart to beat faster (aerobic exercise) most days of the week. Activities may include walking, swimming, or biking.  Include exercise to strengthen your muscles (resistance exercise), such as pilates or lifting weights, as part of your weekly exercise routine. Try to do these types of exercises for 30 minutes at least 3 days a week.  Do not use any products that contain nicotine or tobacco, such as cigarettes and e-cigarettes. If you need help quitting, ask your health care provider.  Monitor your blood pressure at home as told by your health care provider.  Keep all follow-up visits as told by your health care provider. This is important. Medicines  Take over-the-counter and  prescription medicines only as told by your health care provider. Follow directions carefully. Blood pressure medicines must be taken as prescribed.  Do not skip doses of blood pressure medicine. Doing this puts you at risk for problems and can make the medicine less effective.  Ask your health care provider about side effects or reactions to medicines that you should watch for. Contact a health care provider if:  You think you are having a reaction to a medicine you are taking.  You have headaches that keep coming back (recurring).  You feel dizzy.  You have swelling in your ankles.  You have trouble with your vision. Get help right away if:  You develop a severe headache or confusion.  You   have unusual weakness or numbness.  You feel faint.  You have severe pain in your chest or abdomen.  You vomit repeatedly.  You have trouble breathing. Summary  Hypertension is when the force of blood pumping through your arteries is too strong. If this condition is not controlled, it may put you at risk for serious complications.  Your personal target blood pressure may vary depending on your medical conditions, your age, and other factors. For most people, a normal blood pressure is less than 120/80.  Hypertension is treated with lifestyle changes, medicines, or a combination of both. Lifestyle changes include weight loss, eating a healthy, low-sodium diet, exercising more, and limiting alcohol. This information is not intended to replace advice given to you by your health care provider. Make sure you discuss any questions you have with your health care provider. Document Released: 11/19/2005 Document Revised: 10/17/2016 Document Reviewed: 10/17/2016 Elsevier Interactive Patient Education  2018 Elsevier Inc.  

## 2017-08-07 NOTE — Progress Notes (Signed)
Tamara [name], is a 46 y.o. female  [id number]  KVQ:[id number]  DOB - [birth year]  Chief Complaint  Patient presents with  . Chest Pain  . Leg Pain  . Rash       Subjective:   Tamara [name] is a 46 y.o. female with history of hypertension, iron deficiency anemia, breast cancer status post bilateral mastectomy and total abdominal hysterectomy/bilateral oophorectomy and DVT (done with anticoagulation) here today for a sick visit. Patient complains of an itchy rash that started 4 weeks ago. She states that it comes and goes. She has been using over-the-counter hydrocortisone at home, but rash continues to reappear. States rash is located only on the back of her right leg and on the middle and index finger of her right hand. Denies pain at site but does complain of chronic costochondral chest pain due to hx of bilateral masectomy. Currently doesn't take any medications but is requesting topical medication her pain.  Patient has No headache, No fever, No abdominal pain - No Nausea, No new weakness tingling or numbness, No Cough - SOB.  Problem  Rib Pain  Rash and Nonspecific Skin Eruption    ALLERGIES: Allergies  Allergen Reactions  . Compazine [Prochlorperazine Edisylate] Other (See Comments)    Stuttering    PAST MEDICAL HISTORY: Past Medical History:  Diagnosis Date  . Anemia   . BRCA1 positive    c.190T>G (p.Cys64Gly) @ [facility]   . Breast cancer ([facility]) 02/01/14   ER-/PR-/Her2+, still receiving chemo 12/21/14, last Herceptin 02-02-15.Left breat cancer.  . Chest pain    pt states is secondary to bilateral mastectomy  . Depression   . DVT (deep venous thrombosis) ([facility])    Right Subclavian and IJ.. Lovenox stopped 01-10-15 until after surgery planned 02-08-15.  Marland Kitchen Dysrhythmia   . Endometriosis   . Fibroid tumor    02-02-15 remains with abdominal pain and some vaginal bleeding due to fibroids.  Marland Kitchen GERD (gastroesophageal reflux disease)   . History of blood transfusion    last 9'15.  due to chemotherapy.  Marland Kitchen History of chemotherapy   . History of staph infection   . Hypertension   . Iron deficiency anemia, unspecified 02/25/2014  . PONV (postoperative nausea and vomiting)    needs scop patch  . Rash 02/22/2015  . Shortness of breath     when Hemoglobin low, now resolved after transfusions 9'15.; increased exertion as stated per pt during PAT visit 11/08/2016  . Wears glasses     MEDICATIONS AT HOME: Prior to Admission medications   Medication Sig Start Date End Date Taking? Authorizing Provider  Biotin 5000 MCG TABS Take 10,000 mcg by mouth daily. Reported on 06/21/2016    [provider]  buPROPion (WELLBUTRIN) 100 MG tablet Take 1 tablet (100 mg total) by mouth 2 (two) times daily. Patient not taking: Reported on 08/07/2017 09/20/16   Tresa Garter, MD  carvedilol (COREG) 12.5 MG tablet Take 12.5 mg by mouth 2 (two) times daily as needed. For hypertension    [provider]  lidocaine (XYLOCAINE) 5 % ointment Apply 1 application topically daily as needed. 08/07/17   Tresa Garter, MD  pantoprazole (PROTONIX) 40 MG tablet Take 40 mg by mouth daily as needed.    [provider]  predniSONE (DELTASONE) 20 MG tablet Take 1 tablet (20 mg total) by mouth daily with breakfast. 08/07/17   Tresa Garter, MD  triamcinolone ointment (KENALOG) 0.5 % Apply 1 application topically 2 (two) times daily.  08/07/17   Tresa Garter, MD    Objective:   Vitals:   08/07/17 0909  BP: (!) 142/90  Pulse: 66  Resp: 18  Temp: 98.6 F (37 C)  TempSrc: Oral  SpO2: 97%  Weight: 211 lb 12.8 oz (96.1 kg)  Height: 5' 5"  (1.651 m)   Exam General appearance : Awake, alert, not in any distress. Speech Clear. Not toxic looking HEENT: Atraumatic and Normocephalic, pupils equally reactive to light and accomodation Neck: Supple, no JVD. No cervical lymphadenopathy.  Chest: Good air entry bilaterally, no added sounds  CVS: S1 S2 regular, no murmurs.   Abdomen: Bowel sounds present, Non tender and not distended with no gaurding, rigidity or rebound. Extremities: B/L Lower Ext shows no edema, both legs are warm to touch Neurology: Awake alert, and oriented X 3, CN II-XII intact, Non focal Skin: red maculopapular rash on he lateral side of index and middle finger. Dry and darkened skin on the back of her right upper thigh.   Data Review Lab Results  Component Value Date   HGBA1C 5.0 06/21/2016   HGBA1C 5.0 01/29/2014    Assessment & Plan   1. Essential hypertension  We have discussed target BP range and blood pressure goal. I have advised patient to check BP regularly and to call us back or report to clinic if the numbers are consistently higher than 140/90. We discussed the importance of compliance with medical therapy and DASH diet recommended, consequences of uncontrolled hypertension discussed.   2. Rash and nonspecific skin eruption  - triamcinolone ointment (KENALOG) 0.5 %; Apply 1 application topically 2 (two) times daily.  Dispense: 30 g; Refill: 1 - predniSONE (DELTASONE) 20 MG tablet; Take 1 tablet (20 mg total) by mouth daily with breakfast.  Dispense: 5 tablet; Refill: 0  3. Rib pain  - lidocaine (XYLOCAINE) 5 % ointment; Apply 1 application topically daily as needed.  Dispense: 50 g; Refill: 2  Patient have been counseled extensively about nutrition and exercise. Other issues discussed during this visit include: low cholesterol diet, weight control and daily exercise  Return in about 6 months (around 02/04/2018) for Follow up HTN.  The patient was given clear instructions to go to ER or return to medical center if symptoms don't improve, worsen or new problems develop. The patient verbalized understanding. The patient was told to call to get lab results if they haven't heard anything in the next week.   This note has been created with Surveyor, quantity. Any transcriptional  errors are unintentional.    Angelica Chessman, MD, [place], Karilyn Cota, [place] and [facility] [place], [place]   08/07/2017, 10:25 AM

## 2017-11-20 ENCOUNTER — Other Ambulatory Visit: Payer: Self-pay | Admitting: *Deleted

## 2017-11-20 DIAGNOSIS — C50412 Malignant neoplasm of upper-outer quadrant of left female breast: Secondary | ICD-10-CM

## 2017-11-21 ENCOUNTER — Other Ambulatory Visit: Payer: Medicaid Other

## 2017-11-21 ENCOUNTER — Telehealth: Payer: Self-pay | Admitting: Oncology

## 2017-11-21 ENCOUNTER — Ambulatory Visit: Payer: Medicaid Other | Admitting: Oncology

## 2017-11-21 NOTE — Telephone Encounter (Signed)
Patient called in to reschedule due to emergency

## 2017-12-09 ENCOUNTER — Emergency Department (HOSPITAL_COMMUNITY)
Admission: EM | Admit: 2017-12-09 | Discharge: 2017-12-09 | Discharge status: Against Medical Advice | Payer: Medicaid Other

## 2017-12-09 ENCOUNTER — Telehealth: Payer: Self-pay | Admitting: *Deleted

## 2017-12-09 ENCOUNTER — Inpatient Hospital Stay: Payer: Medicaid Other | Attending: Medical | Admitting: Medical

## 2017-12-09 VITALS — BP 153/87 | HR 64 | Temp 98.9°F | Resp 18 | Ht 65.0 in | Wt 213.1 lb

## 2017-12-09 DIAGNOSIS — Z171 Estrogen receptor negative status [ER-]: Secondary | ICD-10-CM | POA: Insufficient documentation

## 2017-12-09 DIAGNOSIS — Z87891 Personal history of nicotine dependence: Secondary | ICD-10-CM | POA: Diagnosis not present

## 2017-12-09 DIAGNOSIS — Z90710 Acquired absence of both cervix and uterus: Secondary | ICD-10-CM | POA: Diagnosis not present

## 2017-12-09 DIAGNOSIS — R911 Solitary pulmonary nodule: Secondary | ICD-10-CM | POA: Diagnosis not present

## 2017-12-09 DIAGNOSIS — J321 Chronic frontal sinusitis: Secondary | ICD-10-CM

## 2017-12-09 DIAGNOSIS — C773 Secondary and unspecified malignant neoplasm of axilla and upper limb lymph nodes: Secondary | ICD-10-CM | POA: Insufficient documentation

## 2017-12-09 DIAGNOSIS — C50412 Malignant neoplasm of upper-outer quadrant of left female breast: Secondary | ICD-10-CM | POA: Diagnosis not present

## 2017-12-09 DIAGNOSIS — N951 Menopausal and female climacteric states: Secondary | ICD-10-CM | POA: Diagnosis not present

## 2017-12-09 DIAGNOSIS — R61 Generalized hyperhidrosis: Secondary | ICD-10-CM | POA: Diagnosis not present

## 2017-12-09 DIAGNOSIS — J0110 Acute frontal sinusitis, unspecified: Secondary | ICD-10-CM

## 2017-12-09 DIAGNOSIS — M62838 Other muscle spasm: Secondary | ICD-10-CM

## 2017-12-09 MED ORDER — PREDNISONE 5 MG PO TABS
ORAL_TABLET | ORAL | 0 refills | Status: DC
Start: 2017-12-09 — End: 2019-09-10

## 2017-12-09 MED ORDER — CEFUROXIME AXETIL 250 MG PO TABS: 250.0000 mg | ORAL_TABLET | Freq: Two times a day (BID) | ORAL | 0 refills | Status: DC

## 2017-12-09 MED ORDER — METHOCARBAMOL 500 MG PO TABS: 500.0000 mg | ORAL_TABLET | Freq: Three times a day (TID) | ORAL | 0 refills | Status: DC

## 2017-12-09 NOTE — ED Notes (Signed)
Patient called for triage with no response x2.

## 2017-12-09 NOTE — Telephone Encounter (Signed)
Pt came in to this office per concern of ongoing pain and discomfort in her bilateral shoulders - with pain then " like moving and going up my neck "  Pt has headaches per above as well.  [name] states general chest discomfort " but since I had bilateral mastectomy I've had chest discomfort ".  Tamara [name] states above is ongoing and she went to the ER " but they said it was not a priority and my wait time could be like 6 hours - "  [name] is concerned due to discomfort and known history of breast cancer with bilateral mastectomy and reconstruction. She had post surgical reconstruction dehiscence complications.  Per lobby discussion pt will be seen in [facility] for assessment and appropriate recommendations.

## 2017-12-09 NOTE — ED Notes (Signed)
Patient called for triage with no response x1.

## 2017-12-09 NOTE — ED Notes (Signed)
Patient called for triage x3 

## 2017-12-10 NOTE — Progress Notes (Signed)
[facility] Progress Note   Tamara [name] [id number] 04/09/1971 89 [place] Jack [name] is managed by Dr. Jana Hakim  Actively treated with chemotherapy: no   Assessment: Plan:    Subacute frontal sinusitis - Plan: cefUROXime (CEFTIN) 250 MG tablet, predniSONE (DELTASONE) 5 MG tablet  Muscle spasm - Plan: methocarbamol (ROBAXIN) 500 MG tablet   Subacute frontal sinusitis: Patient was given a prescription for Ceftin 250 mg p.o. twice daily times 10 days.  Additionally she was given a 6-day prednisone taper.  She was instructed to begin her prednisone taper tomorrow.  Muscle spasms: [name] exam shows muscle spasms in the right lateral SCM in the left lateral deltoid.  She was given a prescription for Robaxin 500 mg p.o. 3 times daily but was instructed to take this at bedtime only now as it could be sedating.  I am hopeful that her prednisone taper will be of benefit for her muscle spasms.  Please see After Visit Summary for patient specific instructions.  Future Appointments  Date Time Provider [place]  12/18/2017 11:30 AM [facility] None  12/18/2017 12:00 PM Magrinat, Tamara Dad, MD [facility] None  02/06/2018 11:30 AM Causey, Charlestine Massed, NP [facility] None    No orders of the defined types were placed in this encounter.      Subjective:   Patient ID:  Tamara [name] is a 47 y.o. (DOB October 20, 1971) female.  Chief Complaint:  Chief Complaint  Patient presents with  . 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  [name] original diagnosis dates to 01/29/2014.  She was last seen by Dr. Jana Hakim on 06/17/2017 and is being followed conservatively with six-month follow-up at this time.  At the time of her last visit Dr. Virgie [name] note stated that the patient "was 3 years  out from definitive surgery for her breast cancer and had no evidence of disease recurrence."  Prior to that visit she had a chest CT done on 04/16/2017 for evaluation of shortness of breath.  The scan showed no evidence of malignancy. There was a 6 mm right middle lobe lung nodule and a 7 mm left lower lobe nodule which were stable over a two-year period.  The patient presents to the office today with report of an episodic headache immediately superior to the bridge of her nose in between her eyes.  She reports that her headache waxes and wanes and is increased with moving at times.  She denies visual changes.  She does report having pain in her right neck this past weekend.  There have been no changes with activity.  She reports that her weight is stable.  She does have ongoing hot flashes and night sweats since having a hysterectomy.  Medications: I have reviewed the patient's current medications.  Allergies:  Allergies  Allergen Reactions  . Compazine [Prochlorperazine Edisylate] Other (See Comments)    Stuttering    Past Medical History:  Diagnosis Date  . Anemia   . BRCA1 positive    c.190T>G (p.Cys64Gly) @ [facility]   . Breast cancer ([place]) 02/01/14   ER-/PR-/Her2+, still receiving chemo 12/21/14, last Herceptin 02-02-15.Left breat cancer.  . Chest pain    pt states is secondary to bilateral mastectomy  . Depression   . DVT (deep venous thrombosis) ([facility])    Right Subclavian and  IJ.. Lovenox stopped 01-10-15 until after surgery planned 02-08-15.  Marland Kitchen Dysrhythmia   . Endometriosis   . Fibroid tumor    02-02-15 remains with abdominal pain and some vaginal bleeding due to fibroids.  Marland Kitchen GERD (gastroesophageal reflux disease)   . History of blood transfusion    last 9'15. due to chemotherapy.  Marland Kitchen History of chemotherapy   . History of staph infection   . Hypertension   . Iron deficiency anemia, unspecified 02/25/2014  . PONV (postoperative nausea and vomiting)    needs scop patch  . Rash 02/22/2015    . Shortness of breath     when Hemoglobin low, now resolved after transfusions 9'15.; increased exertion as stated per pt during PAT visit 11/08/2016  . Wears glasses     Past Surgical History:  Procedure Laterality Date  . BILATERAL TOTAL MASTECTOMY WITH AXILLARY LYMPH NODE DISSECTION Bilateral 07/28/2014   Procedure: BILATERAL TOTAL MASTECTOMY WITH LEFT AXILLARY SENTINEL LYMPH NODE BIOPSY;  Surgeon: Stark Klein, MD;  Location: [place];  Service: General;  Laterality: Bilateral;  . BREAST RECONSTRUCTION Bilateral 03/31/2015   Procedure: BILATERAL BREAST RECONSTRUCTION REVISION WITH BILATERAL LIPOFILLING;  Surgeon: Theodoro Kos, DO;  Location: [place];  Service: Plastics;  Laterality: Bilateral;  . BREAST RECONSTRUCTION WITH PLACEMENT OF TISSUE EXPANDER AND FLEX HD (ACELLULAR HYDRATED DERMIS) Bilateral 07/28/2014   Procedure: BILATERAL BREAST RECONSTRUCTION WITH PLACEMENT OF TISSUE EXPANDER AND FLEX HD (ACELLULAR HYDRATED DERMIS);  Surgeon: Theodoro Kos, DO;  Location: [place];  Service: Plastics;  Laterality: Bilateral;  . CARDIAC CATHETERIZATION     10'15- Dr. Sung Amabile  . CERVICAL POLYPECTOMY  2010  . CESAREAN SECTION     one previous  . LEFT HEART CATHETERIZATION WITH CORONARY ANGIOGRAM N/A 09/13/2014   Procedure: LEFT HEART CATHETERIZATION WITH CORONARY ANGIOGRAM;  Surgeon: Jolaine Artist, MD;  Location: [facility] C[facility] LAB;  Service: Cardiovascular;  Laterality: N/A;  . LIPOSUCTION WITH LIPOFILLING Bilateral 03/31/2015   Procedure: LIPOSUCTION WITH LIPOFILLING;  Surgeon: Theodoro Kos, DO;  Location: [place];  Service: Plastics;  Laterality: Bilateral;  . MASTECTOMY    . MULTIPLE EXTRACTIONS WITH ALVEOLOPLASTY N/A 11/09/2016   Procedure: Extraction of tooth #'s 1,2,18,30, and 32 with alveoloplasty and gross debridement of remaining teeth.;  Surgeon: [name], DDS;  Location: [facility];  Service: Oral Surgery;  Laterality: N/A;  . PORT-A-CATH REMOVAL N/A  05/21/2014   Procedure: REMOVAL PORT-A-CATH;  Surgeon: Zenovia Jarred, MD;  Location: [place];  Service: General;  Laterality: N/A;  . PORTACATH PLACEMENT Right 02/23/2014   Procedure: INSERTION PORT-A-CATH;  Surgeon: Joyice Faster. Cornett, MD;  Location: [place];  Service: General;  Laterality: Right;  . REMOVAL OF TISSUE EXPANDER AND PLACEMENT OF IMPLANT Bilateral 10/14/2014   Procedure: REMOVAL OF BILATERAL BREAST  TISSUE EXPANDER  WITH PLASCEMENT OF BILATERAL  BREAST IMPLANTS;  Surgeon: Theodoro Kos, DO;  Location: [place];  Service: Plastics;  Laterality: Bilateral;  . ROBOTIC ASSISTED TOTAL HYSTERECTOMY WITH BILATERAL SALPINGO OOPHERECTOMY Bilateral 02/08/2015   Procedure: ROBOTIC ASSISTED TOTAL HYSTERECTOMY WITH BILATERAL SALPINGO OOPHORECTOMY; UTERUS WEIGHING GREATER THAN 250 GRAMS;  Surgeon: Everitt Amber, MD;  Location: [facility];  Service: Gynecology;  Laterality: Bilateral;  BRCA 1 GENE MUTATION  . TEE WITHOUT CARDIOVERSION N/A 05/26/2014   Procedure: TRANSESOPHAGEAL ECHOCARDIOGRAM (TEE);  Surgeon: Larey Dresser, MD;  Location: [place];  Service: Cardiovascular;  Laterality: N/A;  . TUBAL LIGATION      Family History  Problem  Relation Age of Onset  . Breast cancer Mother 91       currently 62  . Diabetes Father   . Pancreatic cancer Father 31  . Breast cancer Paternal Aunt 34       currently 3; BRCA1 positive  . Stroke Maternal Grandfather   . Cancer Paternal Aunt        unk. primary; deceased 52s  . Breast cancer Cousin        daughter of unaffected paternal aunt; dx in her 74s    Social History   Socioeconomic History  . Marital status: Legally Separated    Spouse name: Not on file  . Number of children: 5  . Years of education: Not on file  . Highest education level: Not on file  Social Needs  . Financial resource strain: Not on file  . Food insecurity - worry: Not on file  . Food insecurity - inability: Not on file  .  Transportation needs - medical: Not on file  . Transportation needs - non-medical: Not on file  Occupational History  . Not on file  Tobacco Use  . Smoking status: Former Smoker    Packs/day: 0.25    Years: 12.00    Pack years: 3.00    Types: Cigarettes    Last attempt to quit: 02/18/2009    Years since quitting: 8.8  . Smokeless tobacco: Never Used  Substance and Sexual Activity  . Alcohol use: No  . Drug use: Yes    Frequency: 2.0 times per week    Types: Marijuana    Comment: last use 11/07/2016  . Sexual activity: Not Currently    Birth control/protection: Surgical  Other Topics Concern  . Not on file  Social History Narrative  . Not on file    Past Medical History, Surgical history, Social history, and Family history were reviewed and updated as appropriate.   Please see review of systems for further details on the patient's review from today.   Review of Systems:  Review of Systems  Constitutional: Negative for activity change, chills, diaphoresis, fever and unexpected weight change.  HENT: Negative for congestion, postnasal drip, rhinorrhea, sinus pressure, sinus pain, sneezing and sore throat.        Facial pain immediately superior to the bridge of the nose and between the eyes.  Respiratory: Negative for cough and shortness of breath.   Musculoskeletal: Positive for myalgias and neck pain.  Neurological: Positive for headaches.    Objective:   Physical Exam:  BP (!) 153/87 (BP Location: Left Arm, Patient Position: Sitting)   Pulse 64   Temp 98.9 F (37.2 C) (Oral)   Resp 18   Ht 5' 5"  (1.651 m)   Wt 213 lb 1.6 oz (96.7 kg)   LMP 04/04/2014 Comment: neg preg today  SpO2 98%   BMI 35.46 kg/m  ECOG: 0  Physical Exam  Constitutional: No distress.  HENT:  Head: Normocephalic and atraumatic.  Right Ear: External ear normal.  Left Ear: External ear normal.  Nose: Right sinus exhibits frontal sinus tenderness. Right sinus exhibits no maxillary sinus  tenderness. Left sinus exhibits frontal sinus tenderness. Left sinus exhibits no maxillary sinus tenderness.  Mouth/Throat: Oropharynx is clear and moist. No oropharyngeal exudate.  Eyes: Right eye exhibits no discharge. Left eye exhibits no discharge. No scleral icterus.  Neck: Normal range of motion. Neck supple.    Cardiovascular: Normal rate, regular rhythm and normal heart sounds. Exam reveals no gallop and no friction  rub.  No murmur heard. Pulmonary/Chest: Effort normal and breath sounds normal. No respiratory distress. She has no wheezes. She has no rales.  Musculoskeletal: Normal range of motion. She exhibits tenderness. She exhibits no edema.       Left upper arm: She exhibits tenderness.       Arms: Lymphadenopathy:       Head (right side): Submandibular adenopathy present.       Head (left side): Submandibular adenopathy present.    She has no cervical adenopathy.  Neurological: She is alert. Coordination normal.  Skin: Skin is warm and dry. No rash noted. She is not diaphoretic. No erythema. No pallor.    Lab Review:     Component Value Date/Time   NA 141 06/17/2017 1037   K 4.0 06/17/2017 1037   CL 107 04/23/2016 1353   CO2 26 06/17/2017 1037   GLUCOSE 90 06/17/2017 1037   BUN 12.8 06/17/2017 1037   CREATININE 1.0 06/17/2017 1037   CALCIUM 10.0 06/17/2017 1037   PROT 7.2 06/17/2017 1037   ALBUMIN 4.1 06/17/2017 1037   AST 16 06/17/2017 1037   ALT 17 06/17/2017 1037   ALKPHOS 89 06/17/2017 1037   BILITOT 0.36 06/17/2017 1037   GFRNONAA >60 04/23/2016 1353   GFRAA >60 04/23/2016 1353       Component Value Date/Time   WBC 8.7 06/17/2017 1037   WBC 8.4 09/20/2016 0948   RBC 4.29 06/17/2017 1037   RBC 4.72 09/20/2016 0948   HGB 12.4 06/17/2017 1037   HCT 38.6 06/17/2017 1037   PLT 297 06/17/2017 1037   MCV 90.0 06/17/2017 1037   MCH 28.9 06/17/2017 1037   MCH 28.4 09/20/2016 0948   MCHC 32.1 06/17/2017 1037   MCHC 32.0 09/20/2016 0948   RDW 12.5  06/17/2017 1037   LYMPHSABS 2.8 06/17/2017 1037   MONOABS 0.6 06/17/2017 1037   EOSABS 0.1 06/17/2017 1037   BASOSABS 0.0 06/17/2017 1037   -------------------------------  Imaging from last 24 hours (if applicable):  Radiology interpretation: No results found.

## 2017-12-17 NOTE — Progress Notes (Signed)
No show

## 2017-12-18 ENCOUNTER — Ambulatory Visit (HOSPITAL_BASED_OUTPATIENT_CLINIC_OR_DEPARTMENT_OTHER): Payer: Medicaid Other | Admitting: Oncology

## 2017-12-18 ENCOUNTER — Other Ambulatory Visit: Payer: Medicaid Other

## 2017-12-18 ENCOUNTER — Encounter: Payer: Self-pay | Admitting: Oncology

## 2017-12-18 DIAGNOSIS — Z1501 Genetic susceptibility to malignant neoplasm of breast: Secondary | ICD-10-CM

## 2017-12-18 DIAGNOSIS — C50919 Malignant neoplasm of unspecified site of unspecified female breast: Secondary | ICD-10-CM

## 2018-01-09 ENCOUNTER — Other Ambulatory Visit: Payer: Self-pay | Admitting: Internal Medicine

## 2018-01-09 ENCOUNTER — Ambulatory Visit (HOSPITAL_COMMUNITY)
Admission: RE | Admit: 2018-01-09 | Discharge: 2018-01-09 | Disposition: A | Discharge status: Home / Self Care | Payer: Medicaid Other | Source: Ambulatory Visit | Attending: Internal Medicine | Admitting: Internal Medicine

## 2018-01-09 DIAGNOSIS — Z021 Encounter for pre-employment examination: Secondary | ICD-10-CM

## 2018-01-10 ENCOUNTER — Telehealth (INDEPENDENT_AMBULATORY_CARE_PROVIDER_SITE_OTHER): Payer: Self-pay | Admitting: *Deleted

## 2018-01-10 NOTE — Telephone Encounter (Signed)
Patient verified DOB Patient is aware of chest xray being normal. Patient requested a copy and MA will place at the front desk of [facility] for pickup this afternoon.

## 2018-01-10 NOTE — Telephone Encounter (Signed)
-----   Message from Tresa Garter, MD sent at 01/10/2018 12:05 PM EST ----- Normal Chest X Ray

## 2018-02-06 ENCOUNTER — Encounter: Payer: Self-pay | Admitting: Adult Health

## 2018-03-29 ENCOUNTER — Emergency Department (HOSPITAL_COMMUNITY)
Admission: EM | Admit: 2018-03-29 | Discharge: 2018-03-29 | Disposition: A | Discharge status: Home / Self Care | Payer: Medicaid Other | Attending: Emergency Medicine | Admitting: Emergency Medicine

## 2018-03-29 ENCOUNTER — Encounter (HOSPITAL_COMMUNITY): Payer: Self-pay

## 2018-03-29 ENCOUNTER — Other Ambulatory Visit: Payer: Self-pay

## 2018-03-29 DIAGNOSIS — M79602 Pain in left arm: Secondary | ICD-10-CM | POA: Insufficient documentation

## 2018-03-29 DIAGNOSIS — Z5321 Procedure and treatment not carried out due to patient leaving prior to being seen by health care provider: Secondary | ICD-10-CM | POA: Insufficient documentation

## 2018-03-29 DIAGNOSIS — M549 Dorsalgia, unspecified: Secondary | ICD-10-CM | POA: Insufficient documentation
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
## 2018-03-29 NOTE — ED Notes (Signed)
I am unable to locate pt.

## 2018-03-29 NOTE — ED Notes (Signed)
I am still unable to locate pt.

## 2018-04-17 ENCOUNTER — Ambulatory Visit: Payer: Self-pay | Admitting: Family Medicine

## 2018-08-23 ENCOUNTER — Emergency Department (HOSPITAL_COMMUNITY)
Admission: EM | Admit: 2018-08-23 | Discharge: 2018-08-23 | Disposition: A | Discharge status: Home / Self Care | Payer: Medicaid Other | Attending: Emergency Medicine | Admitting: Emergency Medicine

## 2018-08-23 ENCOUNTER — Encounter (HOSPITAL_COMMUNITY): Payer: Self-pay | Admitting: Emergency Medicine

## 2018-08-23 ENCOUNTER — Other Ambulatory Visit: Payer: Self-pay

## 2018-08-23 DIAGNOSIS — N938 Other specified abnormal uterine and vaginal bleeding: Secondary | ICD-10-CM | POA: Insufficient documentation

## 2018-08-23 DIAGNOSIS — Z79899 Other long term (current) drug therapy: Secondary | ICD-10-CM | POA: Insufficient documentation

## 2018-08-23 DIAGNOSIS — Z87891 Personal history of nicotine dependence: Secondary | ICD-10-CM | POA: Insufficient documentation

## 2018-08-23 DIAGNOSIS — N939 Abnormal uterine and vaginal bleeding, unspecified: Secondary | ICD-10-CM

## 2018-08-23 DIAGNOSIS — I10 Essential (primary) hypertension: Secondary | ICD-10-CM | POA: Insufficient documentation

## 2018-08-23 LAB — URINALYSIS, ROUTINE W REFLEX MICROSCOPIC
BACTERIA UA: NONE SEEN
Bilirubin Urine: NEGATIVE
Glucose, UA: NEGATIVE mg/dL
HGB URINE DIPSTICK: NEGATIVE
Ketones, ur: 5 mg/dL — AB
Nitrite: NEGATIVE
PROTEIN: NEGATIVE mg/dL
Specific Gravity, Urine: 1.026 (ref 1.005–1.030)
pH: 7 (ref 5.0–8.0)

## 2018-08-23 LAB — COMPREHENSIVE METABOLIC PANEL
ALBUMIN: 3.9 g/dL (ref 3.5–5.0)
ALT: 12 U/L (ref 0–44)
AST: 18 U/L (ref 15–41)
Alkaline Phosphatase: 73 U/L (ref 38–126)
Anion gap: 7 (ref 5–15)
BILIRUBIN TOTAL: 0.7 mg/dL (ref 0.3–1.2)
BUN: 11 mg/dL (ref 6–20)
CO2: 26 mmol/L (ref 22–32)
Calcium: 9.3 mg/dL (ref 8.9–10.3)
Chloride: 108 mmol/L (ref 98–111)
Creatinine, Ser: 0.8 mg/dL (ref 0.44–1.00)
GFR calc Af Amer: >60 mL/min (ref >60)
GFR calc non Af Amer: >60 mL/min (ref >60)
GLUCOSE: 97 mg/dL (ref 70–99)
POTASSIUM: 4.4 mmol/L (ref 3.5–5.1)
Sodium: 141 mmol/L (ref 135–145)
TOTAL PROTEIN: 6.7 g/dL (ref 6.5–8.1)

## 2018-08-23 LAB — CBC
HEMATOCRIT: 39.5 % (ref 36.0–46.0)
Hemoglobin: 12.3 g/dL (ref 12.0–15.0)
MCH: 28.8 pg (ref 26.0–34.0)
MCHC: 31.1 g/dL (ref 30.0–36.0)
MCV: 92.5 fL (ref 78.0–100.0)
Platelets: 339 10*3/uL (ref 150–400)
RBC: 4.27 MIL/uL (ref 3.87–5.11)
RDW: 12.2 % (ref 11.5–15.5)
WBC: 9.5 10*3/uL (ref 4.0–10.5)

## 2018-08-23 LAB — I-STAT BETA HCG BLOOD, ED (MC, WL, AP ONLY): I-stat hCG, quantitative: <5 m[IU]/mL (ref <5)

## 2018-08-23 NOTE — ED Triage Notes (Signed)
Pt presents with vaginal bleeding, pelvic pain and lower back pain that began today; pt hx of hysterectomy and cancer; pt denies recent intercourse or trauma

## 2018-08-23 NOTE — Discharge Instructions (Signed)
May take ibuprofen or Tylenol for your pain.  If you experience any fever, severe bleeding, pain or shortness of breath please return to the ED for reevaluation.  Please follow-up with your PCP within 1 week for further work-up on your vaginal bleeding.

## 2018-08-23 NOTE — ED Provider Notes (Signed)
[facility] EMERGENCY DEPARTMENT Provider Note   CSN: [id number] Arrival date & time: 08/23/18  1555     History   Chief Complaint Chief Complaint  Patient presents with  . Vaginal Bleeding  . Pelvic Pain  . Back Pain    HPI Tamara [name] is a 47 y.o. female.  47 y.o female with a PMH of DVT,Breast CA,HTN, Fibroid tumor presents to the ED with a chief complaint of vaginal bleeding since today. Patient reports mild spotting along with abdominal cramping which began this morning. She reports she noticed blood on the toilet tissue and was surprised so she stuffed the tissue inside there vaginal canal and saw more blood the tissue. She reports her some abdominal cramping "feels like on my cycle". Patient reports a previous history of a hysterectomy in 2016 , and states she has not been seen by a gynecologist since her ectomy.  She is also a breast cancer survivor with a double mastectomy.  Denies trying any medical therapy for her pain.  Denies any nausea vomiting diarrhea, fever, chest pain or shortness of breath.     Past Medical History:  Diagnosis Date  . Anemia   . BRCA1 positive    c.190T>G (p.Cys64Gly) @ [facility]   . Breast cancer ([place]) 02/01/14   ER-/PR-/Her2+, still receiving chemo 12/21/14, last Herceptin 02-02-15.Left breat cancer.  . Chest pain    pt states is secondary to bilateral mastectomy  . Depression   . DVT (deep venous thrombosis) ([facility])    Right Subclavian and IJ.. Lovenox stopped 01-10-15 until after surgery planned 02-08-15.  Marland Kitchen Dysrhythmia   . Endometriosis   . Fibroid tumor    02-02-15 remains with abdominal pain and some vaginal bleeding due to fibroids.  Marland Kitchen GERD (gastroesophageal reflux disease)   . History of blood transfusion    last 9'15. due to chemotherapy.  Marland Kitchen History of chemotherapy   . History of staph infection   . Hypertension   . Iron deficiency anemia, unspecified 02/25/2014  . PONV (postoperative nausea and vomiting)    needs scop  patch  . Rash 02/22/2015  . Shortness of breath     when Hemoglobin low, now resolved after transfusions 9'15.; increased exertion as stated per pt during PAT visit 11/08/2016  . Wears glasses     Patient Active Problem List   Diagnosis Date Noted  . Rib pain 08/07/2017  . Chronic periodontitis 11/05/2016  . Adjustment disorder with mixed anxiety and depressed mood 09/20/2016  . Left hip pain 07/19/2016  . Urination frequency 06/21/2016  . Low back pain, non-specific 06/21/2016  . Acute deep vein thrombosis (DVT) of proximal end of right lower extremity ([place]) 12/15/2015  . Breast cancer, BRCA1 positive ([place]) 12/15/2015  . Genetic testing 10/10/2015  . Chest pain 08/28/2015  . Precordial chest pain 08/18/2015  . 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 Rash and nonspecific skin eruption 02/22/2015  . Long term current use of anticoagulant therapy 02/22/2015  . 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 02/08/2015  . Heavy menstrual bleeding 01/11/2015  . New daily persistent headache 12/16/2014  . Essential hypertension, benign 12/16/2014  . Moderate recurrent major depression ([place]) 12/16/2014  . Cardiomyopathy ([place]) 10/20/2014  . Cardiomyopathy, ischemic 09/06/2014  . Fever 09/02/2014  . Neoplasm related pain 09/02/2014  . Anemia 08/07/2014  . Symptomatic anemia 08/07/2014  . Absence of breast 08/06/2014  . Breast cancer ([place]) 07/28/2014  . Bloodstream infection due to Port-A-Cath 05/30/2014  . Acute blood loss anemia  05/20/2014  . Staphylococcus aureus bacteremia with sepsis ([place]) 05/19/2014  . Sinus tachycardia 05/19/2014  . AKI (acute kidney injury) ([place]) 05/19/2014  . Palpitations 05/05/2014  . BRCA1 positive   . Local infection due to Port-A-Cath 03/09/2014  . Iron deficiency anemia 02/25/2014  . HTN (hypertension) 02/19/2014  . Malignant neoplasm of upper-outer quadrant of left breast in female, estrogen receptor negative ([place]) 02/02/2014    Past Surgical History:    Procedure Laterality Date  . BILATERAL TOTAL MASTECTOMY WITH AXILLARY LYMPH NODE DISSECTION Bilateral 07/28/2014   Procedure: BILATERAL TOTAL MASTECTOMY WITH LEFT AXILLARY SENTINEL LYMPH NODE BIOPSY;  Surgeon: Stark Klein, MD;  Location: [facility];  Service: General;  Laterality: Bilateral;  . BREAST RECONSTRUCTION Bilateral 03/31/2015   Procedure: BILATERAL BREAST RECONSTRUCTION REVISION WITH BILATERAL LIPOFILLING;  Surgeon: Theodoro Kos, DO;  Location: [facility];  Service: Plastics;  Laterality: Bilateral;  . BREAST RECONSTRUCTION WITH PLACEMENT OF TISSUE EXPANDER AND FLEX HD (ACELLULAR HYDRATED DERMIS) Bilateral 07/28/2014   Procedure: BILATERAL BREAST RECONSTRUCTION WITH PLACEMENT OF TISSUE EXPANDER AND FLEX HD (ACELLULAR HYDRATED DERMIS);  Surgeon: Theodoro Kos, DO;  Location: [facility];  Service: Plastics;  Laterality: Bilateral;  . CARDIAC CATHETERIZATION     10'15- Dr. Sung Amabile  . CERVICAL POLYPECTOMY  2010  . CESAREAN SECTION     one previous  . LEFT HEART CATHETERIZATION WITH CORONARY ANGIOGRAM N/A 09/13/2014   Procedure: LEFT HEART CATHETERIZATION WITH CORONARY ANGIOGRAM;  Surgeon: Jolaine Artist, MD;  Location: [facility] CATH [facility];  Service: Cardiovascular;  Laterality: N/A;  . LIPOSUCTION WITH LIPOFILLING Bilateral 03/31/2015   Procedure: LIPOSUCTION WITH LIPOFILLING;  Surgeon: Theodoro Kos, DO;  Location: [facility] [place] [facility];  Service: Plastics;  Laterality: Bilateral;  . MASTECTOMY    . MULTIPLE EXTRACTIONS WITH ALVEOLOPLASTY N/A 11/09/2016   Procedure: Extraction of tooth #'s 1,2,18,30, and 32 with alveoloplasty and gross debridement of remaining teeth.;  Surgeon: [name], DDS;  Location: [facility];  Service: Oral Surgery;  Laterality: N/A;  . PORT-A-CATH REMOVAL N/A 05/21/2014   Procedure: REMOVAL PORT-A-CATH;  Surgeon: Zenovia Jarred, MD;  Location: [place];  Service: General;  Laterality: N/A;  . PORTACATH PLACEMENT Right 02/23/2014   Procedure: INSERTION  PORT-A-CATH;  Surgeon: Joyice Faster. Cornett, MD;  Location: [facility];  Service: General;  Laterality: Right;  . REMOVAL OF TISSUE EXPANDER AND PLACEMENT OF IMPLANT Bilateral 10/14/2014   Procedure: REMOVAL OF BILATERAL BREAST  TISSUE EXPANDER  WITH PLASCEMENT OF BILATERAL  BREAST IMPLANTS;  Surgeon: Theodoro Kos, DO;  Location: [facility];  Service: Plastics;  Laterality: Bilateral;  . ROBOTIC ASSISTED TOTAL HYSTERECTOMY WITH BILATERAL SALPINGO OOPHERECTOMY Bilateral 02/08/2015   Procedure: ROBOTIC ASSISTED TOTAL HYSTERECTOMY WITH BILATERAL SALPINGO OOPHORECTOMY; UTERUS WEIGHING GREATER THAN 250 GRAMS;  Surgeon: Everitt Amber, MD;  Location: [facility];  Service: Gynecology;  Laterality: Bilateral;  BRCA 1 GENE MUTATION  . TEE WITHOUT CARDIOVERSION N/A 05/26/2014   Procedure: TRANSESOPHAGEAL ECHOCARDIOGRAM (TEE);  Surgeon: Larey Dresser, MD;  Location: [facility];  Service: Cardiovascular;  Laterality: N/A;  . TUBAL LIGATION       OB History    Gravida  5   Para  5   Term  5   Preterm      AB      Living  5     SAB      TAB      Ectopic      Multiple      Live Births  Home Medications    Prior to Admission medications   Medication Sig Start Date End Date Taking? Authorizing Provider  Biotin 5000 MCG TABS Take 10,000 mcg by mouth daily. Reported on 06/21/2016    [provider]  cefUROXime (CEFTIN) 250 MG tablet Take 1 tablet (250 mg total) by mouth 2 (two) times daily with a meal. 12/09/17   Tanner, Lyndon Code., PA-C  lidocaine (XYLOCAINE) 5 % ointment Apply 1 application topically daily as needed. Patient not taking: Reported on 12/09/2017 08/07/17   Tresa Garter, MD  methocarbamol (ROBAXIN) 500 MG tablet Take 1 tablet (500 mg total) by mouth 3 (three) times daily. 12/09/17   Tanner, Lyndon Code., PA-C  pantoprazole (PROTONIX) 40 MG tablet Take 40 mg by mouth daily as needed.    [provider]  predniSONE (DELTASONE) 5 MG  tablet 6 tabs x 1 day, 5 tabs x 1 day, 4 tabs x 1 day, 3 tabs x 1 day, 2 tab x 1 day, 1 tab x 1 day 12/09/17   Harle Stanford., PA-C    Family History Family History  Problem Relation Age of Onset  . Breast cancer Mother 72       currently 96  . Diabetes Father   . Pancreatic cancer Father 65  . Breast cancer Paternal Aunt 43       currently 76; BRCA1 positive  . Stroke Maternal Grandfather   . Cancer Paternal Aunt        unk. primary; deceased 53s  . Breast cancer Cousin        daughter of unaffected paternal aunt; dx in her 11s    Social History Social History   Tobacco Use  . Smoking status: Former Smoker    Packs/day: 0.25    Years: 12.00    Pack years: 3.00    Types: Cigarettes    Last attempt to quit: 02/18/2009    Years since quitting: 9.5  . Smokeless tobacco: Never Used  Substance Use Topics  . Alcohol use: No  . Drug use: Yes    Frequency: 2.0 times per week    Types: Marijuana    Comment: last use 11/07/2016     Allergies   Compazine [prochlorperazine edisylate]   Review of Systems Review of Systems  Constitutional: Negative for chills and fever.  Respiratory: Negative for shortness of breath.   Cardiovascular: Negative for chest pain.  Gastrointestinal: Negative for abdominal pain (cramping lower region), diarrhea, nausea and vomiting.  Genitourinary: Positive for vaginal bleeding. Negative for dysuria, flank pain, hematuria and vaginal discharge.  Musculoskeletal: Negative for back pain.  All other systems reviewed and are negative.    Physical Exam Updated Vital Signs BP 120/82   Pulse 78   Temp 98.8 F (37.1 C) (Oral)   Resp (!) 25   Ht 5' 5"  (1.651 m)   Wt 90.7 kg   LMP 04/04/2014 Comment: neg preg today  SpO2 100%   BMI 33.28 kg/m   Physical Exam  Constitutional: She appears well-developed and well-nourished.  Non-ill appearing or toxi appearing with husband at the bedside.   HENT:  Head: Normocephalic and atraumatic.  Neck:  Normal range of motion. Neck supple.  Pulmonary/Chest: Effort normal and breath sounds normal. She has no decreased breath sounds.  Breath sounds throughout all lung fields.  Abdominal: Soft. Bowel sounds are normal. She exhibits no mass. There is tenderness (middle abdomen).  Bowel sounds present throughout all 4 quadrants. Tenderness to palpation suprapubic region. Last  Bowel movement this morning.  Genitourinary: Pelvic exam was performed with patient supine.  Genitourinary Comments: No cervix visualized.   Nursing note and vitals reviewed.    ED Treatments / Results  Labs (all labs ordered are listed, but only abnormal results are displayed) Labs Reviewed  URINALYSIS, ROUTINE W REFLEX MICROSCOPIC - Abnormal; Notable for the following components:      Result Value   Ketones, ur 5 (*)    Leukocytes, UA TRACE (*)    All other components within normal limits  CBC  COMPREHENSIVE METABOLIC PANEL  I-STAT BETA HCG BLOOD, ED ([facility], [facility], [facility] ONLY)    EKG None  Radiology No results found.  Procedures Procedures (including critical care time)  Medications Ordered in ED Medications - No data to display   Initial Impression / Assessment and Plan / ED Course  I have reviewed the triage vital signs and the nursing notes.  Pertinent labs & imaging results that were available during my care of the patient were reviewed by me and considered in my medical decision making (see chart for details).     Resents to the ED with mild vaginal spotting that began this morning.  Upon examination on pelvic exam there is a small amount of blood in the vaginal vault, no cervix was seen as patient had a previous hysterectomy 2016.  Patient's vital signs have been stable throughout her visit, she does not have a fever, at this time I believe is reasonable to have patient follow-up with her PCP and GYN for further work-up and spotting after hysterectomy.    CMP showed no electrolyte abnormality, all  within normal limits creatinine 0.80.  CBC showed no signs of leukocytosis.  Urine sample showed no bacteria, nitrates, patient does not complain of any urinary symptoms. Beta HCG is negative. Patient does not have a cervix.   At this point patient's vitals have been stable and reassuring we will have her follow-up with her GYN or PCP this week. Return precautions have been discussed with patient. I believe torsion is less likely as patient had a complete hysterectomy, Hgb is stable, less likely to have significant amount of blood loss. Patient understand and agrees with plan. Return precautions provided.   Final Clinical Impressions(s) / ED Diagnoses   Final diagnoses:  Vaginal bleeding    ED Discharge Orders    None       Janeece Fitting, Hershal Coria 08/23/18 1946    Gareth Morgan, MD 08/24/18 1308

## 2018-08-26 ENCOUNTER — Telehealth: Payer: Self-pay | Admitting: *Deleted

## 2018-08-26 NOTE — Telephone Encounter (Signed)
This RN spoke with pt per her call regarding onset of vaginal bleeding - and her visit to the ER.  Tamara [name] states she is still having some bleeding primarily when she wipes with a tissue.  She does notice some low back pain and abdominal cramping.  She states urine is normal with no burning, itching or hesitancy.  Bowel are regular and soft.  This RN informed her per review with GYN/ONC dept who performed her BSO with Hysto in 2016 and normal GYN exam per ER visit - recommended for pt to be seen by a GYN for routine post menopausal concerns.  Above discussed as well as possible vaginal atrophy secondary to being abruptly postmenopausal per surgery. Discussed vaginal moisturizers and possible need to see Pelvic PT for ongoing issues.  This RN stated concern due to low back and abd discomfort- and noted pt needs to be seen for routine follow up ( she was a no show for prior appointment and did not reschedule).  This RN scheduled pt for follow up next week with MD for further evaluation with Pricilla Handler verifying appointment date and time as well as to call if her symptoms worsen.  No further needs at this time.

## 2018-09-04 NOTE — Progress Notes (Signed)
ID: Tamara [name] OB: 01-Oct-1971  MR#: [id number]  CSN#:[id number]  PCP: Tresa Garter, MD GYN:   Tamara: Dr. Erroll Luna OTHER MD: Dr. 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 There was also an 8 mm mass 8 cm from the nipple in the ipsilateral breast, as well as positive lymph nodes. 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 MRI confirmed 2.8 and 1.3 cm mass is as well as lymph node. On the right a 6 mm nodule, biopsied and negative for malignancy. PET scan 5-57-32 had hypermetabolic left breast mass consistent with known neoplasm and FDG positive left axillary lymph nodes,benign-appearing brown fat activity and muscular activity in the  neck and chest but no findings for metastatic disease involving the neck, chest, abdomen, pelvis or bones. Moderate FDG activity in the endometrial canal is thought likely due to secretory phase of ovulation or menses. No mass, uterine fibroids present. CT [facility] 02-19-14 had 3 cm left breast mass, enlarged left axillary lymph nodes are positive and no CT findings for metastatic disease involving the chest,abdomen or pelvis and no evidence of osseous metastatic disease. Mildly enlarged fibroid uterus."   Her subsequent treatment is as detailed below   INTERVAL HISTORY:   Tamara [name] returns today for follow-up of her estrogen  receptor negative breast cancer. She continues under observation. She completes regular breast self-exams, an she denies any changes in her breasts.   She had some vaginal bleeding was seen in the ED on 08/23/2018.  Vaginal exam there states: "Upon examination on pelvic exam there is a small amount of blood in the vaginal vault, no cervix was seen as patient had a previous hysterectomy 2016."  Recall she is status post total hysterectomy with BSO. She has not followed up with a gynecologist after this. She also felt cramping and back pain that was similar to her starting a period. It was suggested that she could have vaginal atrophy, but the patient denies vaginal dryness.  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 She has had pain in her upper chest wall recently. She thinks this could be from pulling and tugging this at work. She denies unusual headaches, visual changes, nausea, vomiting, or dizziness. There has been no unusual cough, phlegm production, or pleurisy. There has been no change in bowel or bladder habits. She denies unexplained fatigue or unexplained weight loss, bleeding, rash, or fever. A detailed review of systems was otherwise stable.    PAST MEDICAL HISTORY: Past Medical History:  Diagnosis Date  . Anemia   . BRCA1 positive    c.190T>G (p.Cys64Gly) @ [facility]   . Breast cancer ([place]) 02/01/14   ER-/PR-/Her2+, still receiving chemo 12/21/14, last Herceptin 02-02-15.Left breat cancer.  . Chest pain    pt states is secondary to bilateral mastectomy  . Depression   . DVT (deep venous thrombosis) ([place])  Right Subclavian and IJ.. Lovenox stopped 01-10-15 until after surgery planned 02-08-15.  Marland Kitchen Dysrhythmia   . Endometriosis   . Fibroid tumor    02-02-15 remains with abdominal pain and some vaginal bleeding due to fibroids.  Marland Kitchen GERD (gastroesophageal reflux disease)   . History of blood transfusion     last 9'15. due to chemotherapy.  Marland Kitchen History of chemotherapy   . History of staph infection   . Hypertension   . Iron deficiency anemia, unspecified 02/25/2014  . PONV (postoperative nausea and vomiting)    needs scop patch  . Rash 02/22/2015  . Shortness of breath     when Hemoglobin low, now resolved after transfusions 9'15.; increased exertion as stated per pt during PAT visit 11/08/2016  . Wears glasses     PAST SURGICAL HISTORY: Past Surgical History:  Procedure Laterality Date  . BILATERAL TOTAL MASTECTOMY WITH AXILLARY LYMPH NODE DISSECTION Bilateral 07/28/2014   Procedure: BILATERAL TOTAL MASTECTOMY WITH LEFT AXILLARY SENTINEL LYMPH NODE BIOPSY;  Surgeon: Stark Klein, MD;  Location: [place];  Service: General;  Laterality: Bilateral;  . BREAST RECONSTRUCTION Bilateral 03/31/2015   Procedure: BILATERAL BREAST RECONSTRUCTION REVISION WITH BILATERAL LIPOFILLING;  Surgeon: Theodoro Kos, DO;  Location: [place];  Service: Plastics;  Laterality: Bilateral;  . BREAST RECONSTRUCTION WITH PLACEMENT OF TISSUE EXPANDER AND FLEX HD (ACELLULAR HYDRATED DERMIS) Bilateral 07/28/2014   Procedure: BILATERAL BREAST RECONSTRUCTION WITH PLACEMENT OF TISSUE EXPANDER AND FLEX HD (ACELLULAR HYDRATED DERMIS);  Surgeon: Theodoro Kos, DO;  Location: [place];  Service: Plastics;  Laterality: Bilateral;  . CARDIAC CATHETERIZATION     10'15- Dr. Sung Amabile  . CERVICAL POLYPECTOMY  2010  . CESAREAN SECTION     one previous  . LEFT HEART CATHETERIZATION WITH CORONARY ANGIOGRAM N/A 09/13/2014   Procedure: LEFT HEART CATHETERIZATION WITH CORONARY ANGIOGRAM;  Surgeon: Jolaine Artist, MD;  Location[facility] C[facility];  Service: Cardiovascular;  Laterality: N/A;  . LIPOSUCTION WITH LIPOFILLING Bilateral 03/31/2015   Procedure: LIPOSUCTION WITH LIPOFILLING;  Surgeon: Theodoro Kos, DO;  Location: [place];  Service: Plastics;  Laterality: Bilateral;  . MASTECTOMY    . MULTIPLE EXTRACTIONS  WITH ALVEOLOPLASTY N/A 11/09/2016   Procedure: Extraction of tooth #'s 1,2,18,30, and 32 with alveoloplasty and gross debridement of remaining teeth.;  Surgeon: [name], DDS;  Location: [facility];  Service: Oral Surgery;  Laterality: N/A;  . PORT-A-CATH REMOVAL N/A 05/21/2014   Procedure: REMOVAL PORT-A-CATH;  Surgeon: Zenovia Jarred, MD;  Location: [place];  Service: General;  Laterality: N/A;  . PORTACATH PLACEMENT Right 02/23/2014   Procedure: INSERTION PORT-A-CATH;  Surgeon: Joyice Faster. Cornett, MD;  Location: [place];  Service: General;  Laterality: Right;  . REMOVAL OF TISSUE EXPANDER AND PLACEMENT OF IMPLANT Bilateral 10/14/2014   Procedure: REMOVAL OF BILATERAL BREAST  TISSUE EXPANDER  WITH PLASCEMENT OF BILATERAL  BREAST IMPLANTS;  Surgeon: Theodoro Kos, DO;  Location: [place];  Service: Plastics;  Laterality: Bilateral;  . ROBOTIC ASSISTED TOTAL HYSTERECTOMY WITH BILATERAL SALPINGO OOPHERECTOMY Bilateral 02/08/2015   Procedure: ROBOTIC ASSISTED TOTAL HYSTERECTOMY WITH BILATERAL SALPINGO OOPHORECTOMY; UTERUS WEIGHING GREATER THAN 250 GRAMS;  Surgeon: Everitt Amber, MD;  Location: [facility];  Service: Gynecology;  Laterality: Bilateral;  BRCA 1 GENE MUTATION  . TEE WITHOUT CARDIOVERSION N/A 05/26/2014   Procedure: TRANSESOPHAGEAL ECHOCARDIOGRAM (TEE);  Surgeon: Larey Dresser, MD;  Location: [place];  Service: Cardiovascular;  Laterality: N/A;  . TUBAL LIGATION  FAMILY HISTORY Family History  Problem Relation Age of Onset  . Breast cancer Mother 66       currently 21  . Diabetes Father   . Pancreatic cancer Father 28  . Breast cancer Paternal Aunt 95       currently 9; BRCA1 positive  . Stroke Maternal Grandfather   . Cancer Paternal Aunt        unk. primary; deceased 22s  . Breast cancer Cousin        daughter of unaffected paternal aunt; dx in her 90s  [name] father died from prostate cancer the age of 45. [name] mother was  diagnosed with breast cancer the age of 52. [name] father had 5 sisters, 3 of whom were diagnosed with breast cancer, 2 of them before the age of 58. The patient had one brother, no sisters. There is no history of ovarian cancer in the family.  GYNECOLOGIC HISTORY:  Menarche age 73, first live birth age 63, the patient is [place] P5. Her periods stopped at the time of chemotherapy. 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 Her (former) husband [name] works as an Animal nutritionist. Their divorce was finalized September 2016. [name] oldest child, a son, [name], is studying Therapist, occupational; the patient is a 52 year old daughter [name] is also in college. [name] younger children are 68, 52, and 63. The patient attends a [employer]   ADVANCED DIRECTIVES: Not in place; at the 12/15/2015 visit the patient was given the appropriate forms to complete and notarize at her discretion   HEALTH MAINTENANCE: Social History   Tobacco Use  . Smoking status: Former Smoker    Packs/day: 0.25    Years: 12.00    Pack years: 3.00    Types: Cigarettes    Last attempt to quit: 02/18/2009    Years since quitting: 9.5  . Smokeless tobacco: Never Used  Substance Use Topics  . Alcohol use: No  . Drug use: Yes    Frequency: 2.0 times per week    Types: Marijuana    Comment: last use 11/07/2016   Colonoscopy: Bone Density Scan:  Pap Smear:  Eye Exam:  Vitamin D Level:   Lipid Panel:    Allergies  Allergen Reactions  . Compazine [Prochlorperazine Edisylate] Other (See Comments)    Stuttering    Current Outpatient Medications  Medication Sig Dispense Refill  . Biotin 5000 MCG TABS Take 10,000 mcg by mouth daily. Reported on 06/21/2016    . cefUROXime (CEFTIN) 250 MG tablet Take 1 tablet (250 mg total) by mouth 2 (two) times daily with a meal. 20 tablet 0  .  lidocaine (XYLOCAINE) 5 % ointment Apply 1 application topically daily as needed. (Patient not taking: Reported on 12/09/2017) 50 g 2  . methocarbamol (ROBAXIN) 500 MG tablet Take 1 tablet (500 mg total) by mouth 3 (three) times daily. 21 tablet 0  . pantoprazole (PROTONIX) 40 MG tablet Take 40 mg by mouth daily as needed.    . predniSONE (DELTASONE) 5 MG tablet 6 tabs x 1 day, 5 tabs x 1 day, 4 tabs x 1 day, 3 tabs x 1 day, 2 tab x 1 day, 1 tab x 1 day 21 tablet 0   No current facility-administered medications for this visit.     OBJECTIVE: Young-appearing African American woman in no acute distress  Vitals:   09/05/18  1416  BP: 138/73  Pulse: 66  Resp: 17  Temp: 98.6 F (37 C)  SpO2: 100%     Body mass index is 34.98 kg/m.      ECOG FS:1 - Symptomatic but completely ambulatory  Sclerae unicteric, EOMs intact Oropharynx clear and moist No cervical or supraclavicular adenopathy Lungs no rales or rhonchi Heart regular rate and rhythm Abd soft, nontender, positive bowel sounds MSK no focal spinal tenderness, no upper extremity lymphedema Neuro: nonfocal, well oriented, appropriate affect Breasts: Status post bilateral mastectomies with bilateral implant reconstruction.  There is no evidence of local recurrence.  Both axillae are benign.  LAB RESULTS:  CMP     Component Value Date/Time   NA 144 09/05/2018 1346   NA 141 06/17/2017 1037   K 3.6 09/05/2018 1346   K 4.0 06/17/2017 1037   CL 107 09/05/2018 1346   CO2 27 09/05/2018 1346   CO2 26 06/17/2017 1037   GLUCOSE 101 (H) 09/05/2018 1346   GLUCOSE 90 06/17/2017 1037   BUN 11 09/05/2018 1346   BUN 12.8 06/17/2017 1037   CREATININE 0.90 09/05/2018 1346   CREATININE 1.0 06/17/2017 1037   CALCIUM 9.3 09/05/2018 1346   CALCIUM 10.0 06/17/2017 1037   PROT 6.7 09/05/2018 1346   PROT 7.2 06/17/2017 1037   ALBUMIN 3.8 09/05/2018 1346   ALBUMIN 4.1 06/17/2017 1037   AST 16 09/05/2018 1346   AST 16 06/17/2017 1037   ALT 17  09/05/2018 1346   ALT 17 06/17/2017 1037   ALKPHOS 90 09/05/2018 1346   ALKPHOS 89 06/17/2017 1037   BILITOT 0.4 09/05/2018 1346   BILITOT 0.36 06/17/2017 1037   GFRNONAA >60 09/05/2018 1346   GFRAA >60 09/05/2018 1346    I No results found for: SPEP  Lab Results  Component Value Date   WBC 7.7 09/05/2018   NEUTROABS 5.3 09/05/2018   HGB 12.2 09/05/2018   HCT 37.0 09/05/2018   MCV 88.0 09/05/2018   PLT 272 09/05/2018        Chemistry      Component Value Date/Time   NA 144 09/05/2018 1346   NA 141 06/17/2017 1037   K 3.6 09/05/2018 1346   K 4.0 06/17/2017 1037   CL 107 09/05/2018 1346   CO2 27 09/05/2018 1346   CO2 26 06/17/2017 1037   BUN 11 09/05/2018 1346   BUN 12.8 06/17/2017 1037   CREATININE 0.90 09/05/2018 1346   CREATININE 1.0 06/17/2017 1037      Component Value Date/Time   CALCIUM 9.3 09/05/2018 1346   CALCIUM 10.0 06/17/2017 1037   ALKPHOS 90 09/05/2018 1346   ALKPHOS 89 06/17/2017 1037   AST 16 09/05/2018 1346   AST 16 06/17/2017 1037   ALT 17 09/05/2018 1346   ALT 17 06/17/2017 1037   BILITOT 0.4 09/05/2018 1346   BILITOT 0.36 06/17/2017 1037       No results found for: LABCA2  No components found for: LABCA125  No results for input(s): INR in the last 168 hours.  Urinalysis    Component Value Date/Time   COLORURINE YELLOW 08/23/2018 1626   APPEARANCEUR CLEAR 08/23/2018 1626   LABSPEC 1.026 08/23/2018 1626   LABSPEC 1.025 02/16/2015 1243   PHURINE 7.0 08/23/2018 1626   GLUCOSEU NEGATIVE 08/23/2018 1626   GLUCOSEU Negative 02/16/2015 1243   HGBUR NEGATIVE 08/23/2018 1626   BILIRUBINUR NEGATIVE 08/23/2018 1626   BILIRUBINUR negative 06/21/2016 1120   BILIRUBINUR Negative 02/16/2015 1243   KETONESUR 5 (A) 08/23/2018  [place] 08/23/2018 1626   UROBILINOGEN 0.2 06/21/2016 1120   UROBILINOGEN 0.2 02/16/2015 1243   NITRITE NEGATIVE 08/23/2018 1626   LEUKOCYTESUR TRACE (A) 08/23/2018 1626   LEUKOCYTESUR Moderate  02/16/2015 1243    STUDIES: No results found.   Assessment: 47 y.o. BRCA-1 positive [place] woman  1. S/p left breast upper outer quadrant biopsy of two separate breast masses and one axillary lymph node 01/29/2014 for a , clinical mT2 N1 stage IIB, invasive ductal carcinoma, grade 2-3,  estrogen and progesterone receptor negative, with an MIB-1 between 79-100%, and HER 2 amplified  2. completed 6 cycles of carboplatin, docetaxel, trastuzumab and pertuzumab 06/30/2014 with MRI 07/07/2014 showing a complete radiologic response  3. trastuzumab was to be continued to complete a year (through March 2016); however echocardiogram 07/05/2014 showed an ejection fraction of 45-50%-- trastuzumab was held after 06/30/2014 dose until EF recovery, resumed 11/09/2014  (a) cath report from 09/13/2014 shows normal coronaries and a normal left ventricular function with an ejection fraction of 55%.  (b) echo 03/01/2015 suggest continuing mild cardiomyopathy: Trastuzumab discontinued--final dose 02/22/2015  (c) echocardiogram 01/10/2016 shows an ejection fraction of 45-50%  4. 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   5. MRSA port infection and septicemia mid-June 2015;  Port was removed, and  PICC line placed, completed antibiotic therapy with ANCEF; PICC subsequently pulled   6 genetic testing with the [facility] panel and was found to have a pathogenic mutation in the BRCA1 gene called c.190T>G (p.Cys64Gly  7. status post bilateral mastectomies and left axillary sentinel lymph node biopsy, withimmediate expander placement, on 07/28/14; the pathology ([id number]) showed a complete pathologic response in the left breast and the 3 sentinel lymph nodes sampled; the right breast was benign  (a) definitive implant placement bilaterally 10/14/2014   Left - Mentor memory shape medium height high profile 390cc. Ref #[id number].  Serial Number [id number]   Right -  Mentor memory shape medium height high profile 390cc. Ref #[id number].  Serial Number [id number]  (b) bilateral breast reconstruction revision 03/31/2015  8. Bilateral salpingo-oophorectomy and hysterectomy 02/08/15 with benign pathology  PLAN:  Azaryah is now a little over 4 years out from definitive surgery for her breast cancer with no evidence of disease recurrence.  This is very favorable.  I do not have a simple explanation for her vaginal bleeding.  She already had a pelvic exam that confirmed some blood in the vaginal vault and of course found no cervix.  I am going to set her up for a pelvic ultrasound just to make sure were not dealing with some occult problem.  I am expecting it to be benign.  If so she will see me again next October  She knows to call for any other issues that may develop before the next visit here.   Magrinat, Virgie Dad, MD  09/05/18 2:42 PM Medical Oncology and Hematology [facility] [address] [place], [place] [address] Tel. [phone]    Fax. [phone]  Alice Rieger, am acting as scribe for [name] MD.  I, Lurline Del MD, have reviewed the above documentation for accuracy and completeness, and I agree with the above.

## 2018-09-05 ENCOUNTER — Inpatient Hospital Stay: Payer: Medicaid Other | Attending: Oncology | Admitting: Oncology

## 2018-09-05 ENCOUNTER — Inpatient Hospital Stay: Payer: Medicaid Other

## 2018-09-05 VITALS — BP 138/73 | HR 66 | Temp 98.6°F | Resp 17 | Ht 65.0 in | Wt 210.2 lb

## 2018-09-05 DIAGNOSIS — N939 Abnormal uterine and vaginal bleeding, unspecified: Secondary | ICD-10-CM

## 2018-09-05 DIAGNOSIS — Z171 Estrogen receptor negative status [ER-]: Secondary | ICD-10-CM | POA: Insufficient documentation

## 2018-09-05 DIAGNOSIS — C50412 Malignant neoplasm of upper-outer quadrant of left female breast: Secondary | ICD-10-CM | POA: Insufficient documentation

## 2018-09-05 DIAGNOSIS — Z90710 Acquired absence of both cervix and uterus: Secondary | ICD-10-CM | POA: Insufficient documentation

## 2018-09-05 DIAGNOSIS — Z9013 Acquired absence of bilateral breasts and nipples: Secondary | ICD-10-CM

## 2018-09-05 DIAGNOSIS — C50919 Malignant neoplasm of unspecified site of unspecified female breast: Secondary | ICD-10-CM

## 2018-09-05 DIAGNOSIS — Z87891 Personal history of nicotine dependence: Secondary | ICD-10-CM

## 2018-09-05 DIAGNOSIS — Z86718 Personal history of other venous thrombosis and embolism: Secondary | ICD-10-CM | POA: Insufficient documentation

## 2018-09-05 DIAGNOSIS — C773 Secondary and unspecified malignant neoplasm of axilla and upper limb lymph nodes: Secondary | ICD-10-CM

## 2018-09-05 DIAGNOSIS — Z1501 Genetic susceptibility to malignant neoplasm of breast: Secondary | ICD-10-CM

## 2018-09-05 LAB — COMPREHENSIVE METABOLIC PANEL
ALK PHOS: 90 U/L (ref 38–126)
ALT: 17 U/L (ref 0–44)
AST: 16 U/L (ref 15–41)
Albumin: 3.8 g/dL (ref 3.5–5.0)
Anion gap: 10 (ref 5–15)
BUN: 11 mg/dL (ref 6–20)
CALCIUM: 9.3 mg/dL (ref 8.9–10.3)
CHLORIDE: 107 mmol/L (ref 98–111)
CO2: 27 mmol/L (ref 22–32)
CREATININE: 0.9 mg/dL (ref 0.44–1.00)
Glucose, Bld: 101 mg/dL — ABNORMAL HIGH (ref 70–99)
Potassium: 3.6 mmol/L (ref 3.5–5.1)
Sodium: 144 mmol/L (ref 135–145)
TOTAL PROTEIN: 6.7 g/dL (ref 6.5–8.1)
Total Bilirubin: 0.4 mg/dL (ref 0.3–1.2)

## 2018-09-05 LAB — CBC WITH DIFFERENTIAL/PLATELET
BASOS ABS: 0 10*3/uL (ref 0.0–0.1)
Basophils Relative: 1 %
EOS ABS: 0.1 10*3/uL (ref 0.0–0.5)
EOS PCT: 2 %
HCT: 37 % (ref 34.8–46.6)
HEMOGLOBIN: 12.2 g/dL (ref 11.6–15.9)
LYMPHS ABS: 1.9 10*3/uL (ref 0.9–3.3)
Lymphocytes Relative: 24 %
MCH: 29 pg (ref 25.1–34.0)
MCHC: 33 g/dL (ref 31.5–36.0)
MCV: 88 fL (ref 79.5–101.0)
Monocytes Absolute: 0.4 10*3/uL (ref 0.1–0.9)
Monocytes Relative: 5 %
NEUTROS PCT: 68 %
Neutro Abs: 5.3 10*3/uL (ref 1.5–6.5)
PLATELETS: 272 10*3/uL (ref 145–400)
RBC: 4.2 MIL/uL (ref 3.70–5.45)
RDW: 13 % (ref 11.2–14.5)
WBC: 7.7 10*3/uL (ref 3.9–10.3)

## 2018-09-05 MED ORDER — INFLUENZA VAC SPLIT QUAD 0.5 ML IM SUSY
PREFILLED_SYRINGE | INTRAMUSCULAR | Status: AC
  Filled 2018-09-05: qty 0.5

## 2018-09-08 ENCOUNTER — Telehealth: Payer: Self-pay | Admitting: Oncology

## 2018-09-08 NOTE — Telephone Encounter (Signed)
Per 10/4 los.  Called patient regarding next year's appt for 10/8.  Mailed calendar.  Radiology will call patient regarding MRI.

## 2018-09-13 ENCOUNTER — Ambulatory Visit (HOSPITAL_COMMUNITY): Admission: RE | Admit: 2018-09-13 | Payer: Medicaid Other | Source: Ambulatory Visit

## 2019-04-27 ENCOUNTER — Encounter: Payer: Self-pay | Admitting: Oncology

## 2019-06-08 ENCOUNTER — Encounter (HOSPITAL_COMMUNITY): Payer: Self-pay

## 2019-06-08 ENCOUNTER — Emergency Department (HOSPITAL_COMMUNITY): Payer: Medicaid Other

## 2019-06-08 ENCOUNTER — Other Ambulatory Visit: Payer: Self-pay

## 2019-06-08 ENCOUNTER — Emergency Department (HOSPITAL_COMMUNITY)
Admission: EM | Admit: 2019-06-08 | Discharge: 2019-06-08 | Disposition: A | Discharge status: Home / Self Care | Payer: Medicaid Other | Attending: Emergency Medicine | Admitting: Emergency Medicine

## 2019-06-08 DIAGNOSIS — Z79899 Other long term (current) drug therapy: Secondary | ICD-10-CM | POA: Insufficient documentation

## 2019-06-08 DIAGNOSIS — M25552 Pain in left hip: Secondary | ICD-10-CM | POA: Insufficient documentation

## 2019-06-08 DIAGNOSIS — Z853 Personal history of malignant neoplasm of breast: Secondary | ICD-10-CM | POA: Insufficient documentation

## 2019-06-08 DIAGNOSIS — I10 Essential (primary) hypertension: Secondary | ICD-10-CM | POA: Insufficient documentation

## 2019-06-08 DIAGNOSIS — Z87891 Personal history of nicotine dependence: Secondary | ICD-10-CM | POA: Insufficient documentation

## 2019-06-08 DIAGNOSIS — Z86718 Personal history of other venous thrombosis and embolism: Secondary | ICD-10-CM | POA: Insufficient documentation

## 2019-06-08 NOTE — Discharge Instructions (Addendum)
Please return for any problem.  Follow-up with your regular care provider as instructed.  Apply ice as instructed.  Use ibuprofen as instructed.  Decrease your exercise regimen for several days as instructed.

## 2019-06-08 NOTE — ED Provider Notes (Signed)
[facility] DEPT Provider Note   CSN: [id number] Arrival date & time: 06/08/19  1949     History   Chief Complaint Chief Complaint  Patient presents with   Hip Pain    L    HPI Tamara [name] is a 48 y.o. female.     48 year old female with prior medical history as detailed below presents for evaluation of left hip pain.  Patient reports that she initiated a new exercise program over the last 2 to 3 weeks.  She reports that she is routinely walking up to 7 miles a day.  She also is doing squats with very light weights.  She reports that last night she started having some left-sided lateral hip discomfort.  This is worse with movement or with her exercise.  She does not take anything for her symptoms.  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  She is requesting a work note.  The history is provided by the patient and medical records.  Hip Pain This is a new problem. The current episode started 12 to 24 hours ago. The problem occurs constantly. The problem has not changed since onset.Pertinent negatives include no chest pain, no abdominal pain, no headaches and no shortness of breath. Nothing aggravates the symptoms. Nothing relieves the symptoms.    Past Medical History:  Diagnosis Date   Anemia    BRCA1 positive    c.190T>G (p.Cys64Gly) @ [facility]    Breast cancer ([place]) 02/01/14   ER-/PR-/Her2+, still receiving chemo 12/21/14, last Herceptin 02-02-15.Left breat cancer.   Chest pain    pt states is secondary to bilateral mastectomy   Depression    DVT (deep venous thrombosis) (HCC)    Right Subclavian and IJ.. Lovenox stopped 01-10-15 until after surgery planned 02-08-15.   Dysrhythmia    Endometriosis    Fibroid tumor    02-02-15 remains with abdominal pain and some vaginal bleeding due to fibroids.   GERD (gastroesophageal reflux disease)    History of blood  transfusion    last 9'15. due to chemotherapy.   History of chemotherapy    History of staph infection    Hypertension    Iron deficiency anemia, unspecified 02/25/2014   PONV (postoperative nausea and vomiting)    needs scop patch   Rash 02/22/2015   Shortness of breath     when Hemoglobin low, now resolved after transfusions 9'15.; increased exertion as stated per pt during PAT visit 11/08/2016   Wears glasses     Patient Active Problem List   Diagnosis Date Noted   Rib pain 08/07/2017   Chronic periodontitis 11/05/2016   Adjustment disorder with mixed anxiety and depressed mood 09/20/2016   Left hip pain 07/19/2016   Urination frequency 06/21/2016   Low back pain, non-specific 06/21/2016   Acute deep vein thrombosis (DVT) of proximal end of right lower extremity ([place]) 12/15/2015   Breast cancer, BRCA1 positive ([place]) 12/15/2015   Genetic testing 10/10/2015   Chest pain 08/28/2015   Precordial chest pain 08/18/2015   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   Rash and nonspecific skin eruption 02/22/2015   Long term current use of anticoagulant therapy 02/22/2015   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 02/08/2015   Heavy menstrual bleeding 01/11/2015   New daily persistent headache 12/16/2014   Essential hypertension, benign 12/16/2014   Moderate recurrent major depression ([place]) 12/16/2014   Cardiomyopathy ([place]) 10/20/2014  Cardiomyopathy, ischemic 09/06/2014   Fever 09/02/2014   Neoplasm related pain 09/02/2014   Anemia 08/07/2014   Symptomatic anemia 08/07/2014   Absence of breast 08/06/2014   Breast cancer ([place]) 07/28/2014   Bloodstream infection due to Port-A-Cath 05/30/2014   Acute blood loss anemia 05/20/2014   Staphylococcus aureus bacteremia with sepsis ([place]) 05/19/2014   Sinus tachycardia 05/19/2014   AKI (acute kidney injury) ([place]) 05/19/2014   Palpitations 05/05/2014   BRCA1 positive    Local infection due to  Port-A-Cath 03/09/2014   Iron deficiency anemia 02/25/2014   HTN (hypertension) 02/19/2014   Malignant neoplasm of upper-outer quadrant of left breast in female, estrogen receptor negative ([place]) 02/02/2014    Past Surgical History:  Procedure Laterality Date   BILATERAL TOTAL MASTECTOMY WITH AXILLARY LYMPH NODE DISSECTION Bilateral 07/28/2014   Procedure: BILATERAL TOTAL MASTECTOMY WITH LEFT AXILLARY SENTINEL LYMPH NODE BIOPSY;  Surgeon: Stark Klein, MD;  Location: [facility];  Service: General;  Laterality: Bilateral;   BREAST RECONSTRUCTION Bilateral 03/31/2015   Procedure: BILATERAL BREAST RECONSTRUCTION REVISION WITH BILATERAL LIPOFILLING;  Surgeon: Theodoro Kos, DO;  Location: [place];  Service: Plastics;  Laterality: Bilateral;   BREAST RECONSTRUCTION WITH PLACEMENT OF TISSUE EXPANDER AND FLEX HD (ACELLULAR HYDRATED DERMIS) Bilateral 07/28/2014   Procedure: BILATERAL BREAST RECONSTRUCTION WITH PLACEMENT OF TISSUE EXPANDER AND FLEX HD (ACELLULAR HYDRATED DERMIS);  Surgeon: Theodoro Kos, DO;  Location: [facility];  Service: Plastics;  Laterality: Bilateral;   CARDIAC CATHETERIZATION     10'15- Dr. Sung Amabile   CERVICAL POLYPECTOMY  2010   CESAREAN SECTION     one previous   LEFT HEART CATHETERIZATION WITH CORONARY ANGIOGRAM N/A 09/13/2014   Procedure: LEFT HEART CATHETERIZATION WITH CORONARY ANGIOGRAM;  Surgeon: Jolaine Artist, MD;  Location: [facility] CATH [facility];  Service: Cardiovascular;  Laterality: N/A;   LIPOSUCTION WITH LIPOFILLING Bilateral 03/31/2015   Procedure: LIPOSUCTION WITH LIPOFILLING;  Surgeon: Theodoro Kos, DO;  Location: [facility];  Service: Plastics;  Laterality: Bilateral;   MASTECTOMY     MULTIPLE EXTRACTIONS WITH ALVEOLOPLASTY N/A 11/09/2016   Procedure: Extraction of tooth #'s 1,2,18,30, and 32 with alveoloplasty and gross debridement of remaining teeth.;  Surgeon: [name], DDS;  Location: [facility];  Service: Oral Surgery;   Laterality: N/A;   PORT-A-CATH REMOVAL N/A 05/21/2014   Procedure: REMOVAL PORT-A-CATH;  Surgeon: Zenovia Jarred, MD;  Location: [facility];  Service: General;  Laterality: N/A;   PORTACATH PLACEMENT Right 02/23/2014   Procedure: INSERTION PORT-A-CATH;  Surgeon: Joyice Faster. Cornett, MD;  Location: [facility];  Service: General;  Laterality: Right;   REMOVAL OF TISSUE EXPANDER AND PLACEMENT OF IMPLANT Bilateral 10/14/2014   Procedure: REMOVAL OF BILATERAL BREAST  TISSUE EXPANDER  WITH PLASCEMENT OF BILATERAL  BREAST IMPLANTS;  Surgeon: Theodoro Kos, DO;  Location: [place] [facility];  Service: Plastics;  Laterality: Bilateral;   ROBOTIC ASSISTED TOTAL HYSTERECTOMY WITH BILATERAL SALPINGO OOPHERECTOMY Bilateral 02/08/2015   Procedure: ROBOTIC ASSISTED TOTAL HYSTERECTOMY WITH BILATERAL SALPINGO OOPHORECTOMY; UTERUS WEIGHING GREATER THAN 250 GRAMS;  Surgeon: Everitt Amber, MD;  Location: [facility];  Service: Gynecology;  Laterality: Bilateral;  BRCA 1 GENE MUTATION   TEE WITHOUT CARDIOVERSION N/A 05/26/2014   Procedure: TRANSESOPHAGEAL ECHOCARDIOGRAM (TEE);  Surgeon: Larey Dresser, MD;  Location: [place];  Service: Cardiovascular;  Laterality: N/A;   TUBAL LIGATION       OB History    Gravida  5   Para  5   Term  5  Preterm      AB      Living  5     SAB      TAB      Ectopic      Multiple      Live Births               Home Medications    Prior to Admission medications   Medication Sig Start Date End Date Taking? Authorizing Provider  Biotin 5000 MCG TABS Take 10,000 mcg by mouth daily. Reported on 06/21/2016    [provider]  cefUROXime (CEFTIN) 250 MG tablet Take 1 tablet (250 mg total) by mouth 2 (two) times daily with a meal. 12/09/17   Tanner, Lyndon Code., PA-C  lidocaine (XYLOCAINE) 5 % ointment Apply 1 application topically daily as needed. Patient not taking: Reported on 12/09/2017 08/07/17   Tresa Garter, MD  methocarbamol  (ROBAXIN) 500 MG tablet Take 1 tablet (500 mg total) by mouth 3 (three) times daily. 12/09/17   Tanner, Lyndon Code., PA-C  pantoprazole (PROTONIX) 40 MG tablet Take 40 mg by mouth daily as needed.    [provider]  predniSONE (DELTASONE) 5 MG tablet 6 tabs x 1 day, 5 tabs x 1 day, 4 tabs x 1 day, 3 tabs x 1 day, 2 tab x 1 day, 1 tab x 1 day 12/09/17   Harle Stanford., PA-C    Family History Family History  Problem Relation Age of Onset   Breast cancer Mother 65       currently 27   Diabetes Father    Pancreatic cancer Father 75   Breast cancer Paternal Aunt 12       currently 53; BRCA1 positive   Stroke Maternal Grandfather    Cancer Paternal Aunt        unk. primary; deceased 80s   Breast cancer Cousin        daughter of unaffected paternal aunt; dx in her 15s    Social History Social History   Tobacco Use   Smoking status: Former Smoker    Packs/day: 0.25    Years: 12.00    Pack years: 3.00    Types: Cigarettes    Quit date: 02/18/2009    Years since quitting: 10.3   Smokeless tobacco: Never Used  Substance Use Topics   Alcohol use: No   Drug use: Yes    Frequency: 2.0 times per week    Types: Marijuana    Comment: last use 11/07/2016     Allergies   Compazine [prochlorperazine edisylate]   Review of Systems Review of Systems  Respiratory: Negative for shortness of breath.   Cardiovascular: Negative for chest pain.  Gastrointestinal: Negative for abdominal pain.  Neurological: Negative for headaches.  All other systems reviewed and are negative.    Physical Exam Updated Vital Signs BP (!) 143/95 (BP Location: Right Arm)    Pulse 62    Temp 98.2 F (36.8 C) (Oral)    Resp 17    LMP 04/04/2014 Comment: neg preg today   SpO2 95%   Physical Exam Vitals signs and nursing note reviewed.  Constitutional:      General: She is not in acute distress.    Appearance: She is well-developed.  HENT:     Head: Normocephalic and atraumatic.  Eyes:      Conjunctiva/sclera: Conjunctivae normal.     Pupils: Pupils are equal, round, and reactive to light.  Neck:     Musculoskeletal:  Normal range of motion and neck supple.  Cardiovascular:     Rate and Rhythm: Normal rate and regular rhythm.     Heart sounds: Normal heart sounds.  Pulmonary:     Effort: Pulmonary effort is normal. No respiratory distress.     Breath sounds: Normal breath sounds.  Abdominal:     General: There is no distension.     Palpations: Abdomen is soft.     Tenderness: There is no abdominal tenderness.  Musculoskeletal: Normal range of motion.        General: Tenderness present. No deformity.     Comments: Alternates to the lateral aspect of the left hip.  No significant decrease in the patient's active range of motion of the left hip.  Her gait is normal.  Distal left lower extremity is neurovascular intact.  Skin:    General: Skin is warm and dry.  Neurological:     Mental Status: She is alert and oriented to person, place, and time. Mental status is at baseline.     Sensory: No sensory deficit.      ED Treatments / Results  Labs (all labs ordered are listed, but only abnormal results are displayed) Labs Reviewed - No data to display  EKG None  Radiology Dg Hip Unilat With Pelvis 2-3 Views Left  Result Date: 06/08/2019 CLINICAL DATA:  Left hip pain. EXAM: DG HIP (WITH OR WITHOUT PELVIS) 2-3V LEFT COMPARISON:  None. FINDINGS: There is no acute displaced fracture or dislocation. There are mild degenerative changes of both hips. Presumed phleboliths project over the patient's pelvis. IMPRESSION: No acute osseous abnormality. Electronically Signed   By: Constance Holster M.D.   On: 06/08/2019 21:14    Procedures Procedures (including critical care time)  Medications Ordered in ED Medications - No data to display   Initial Impression / Assessment and Plan / ED Course  I have reviewed the triage vital signs and the nursing notes.  Pertinent labs &  imaging results that were available during my care of the patient were reviewed by me and considered in my medical decision making (see chart for details).        MDM  Screen complete  Tamara [name] was evaluated in Emergency Department on 06/08/2019 for the symptoms described in the history of present illness. She was evaluated in the context of the global COVID-19 pandemic, which necessitated consideration that the patient might be at risk for infection with the SARS-CoV-2 virus that causes COVID-19. Institutional protocols and algorithms that pertain to the evaluation of patients at risk for COVID-19 are in a state of rapid change based on information released by regulatory bodies including the CDC and federal and state organizations. These policies and algorithms were followed during the patient's care in the ED.   Patient is presenting for evaluation of left hip pain.  Patient's reported new exercise regimen likely has strained her left hip.  Plain films of the left hip are without significant abnormality.  Patient is advised to stretch, ice, and use anti-inflammatories for her symptoms.  Importance of close follow-up is stressed.  Strict return precautions given and understood.    Final Clinical Impressions(s) / ED Diagnoses   Final diagnoses:  Left hip pain    ED Discharge Orders    None       Valarie Merino, MD 06/08/19 2138

## 2019-06-08 NOTE — ED Notes (Signed)
Patient assisted out in a wheelchair by this RN and escorted to her vehicle

## 2019-06-08 NOTE — ED Triage Notes (Signed)
Pt reports L hip pain that started yesterday. States that nothing brought it on, it just started in bed yesterday. Able to ambulate, but its painful. Hx of breast cancer- L arm restricted.

## 2019-06-09 ENCOUNTER — Emergency Department (HOSPITAL_COMMUNITY)
Admission: EM | Admit: 2019-06-09 | Discharge: 2019-06-09 | Disposition: A | Discharge status: Home / Self Care | Payer: Medicaid Other | Attending: Emergency Medicine | Admitting: Emergency Medicine

## 2019-06-09 ENCOUNTER — Other Ambulatory Visit: Payer: Self-pay

## 2019-06-09 ENCOUNTER — Encounter (HOSPITAL_COMMUNITY): Payer: Self-pay

## 2019-06-09 DIAGNOSIS — Z853 Personal history of malignant neoplasm of breast: Secondary | ICD-10-CM | POA: Insufficient documentation

## 2019-06-09 DIAGNOSIS — Z87891 Personal history of nicotine dependence: Secondary | ICD-10-CM | POA: Insufficient documentation

## 2019-06-09 DIAGNOSIS — M25552 Pain in left hip: Secondary | ICD-10-CM

## 2019-06-09 DIAGNOSIS — I10 Essential (primary) hypertension: Secondary | ICD-10-CM | POA: Insufficient documentation

## 2019-06-09 MED ORDER — KETOROLAC TROMETHAMINE 30 MG/ML IJ SOLN
30.0000 mg | Freq: Once | INTRAMUSCULAR | Status: AC
  Administered 2019-06-09: 30 mg via INTRAMUSCULAR
  Filled 2019-06-09: qty 1

## 2019-06-09 NOTE — ED Triage Notes (Signed)
Pt was seen yesterday for left hip inflammation. Pt states that she did not taken any medicine at the time, but now she is in a lot of pain. Pt tried epsom salt bath and tiger balm without relief. Pt also states that she will need more than 2 days off from work.

## 2019-06-09 NOTE — ED Provider Notes (Signed)
[facility] DEPT Provider Note   CSN: [id number] Arrival date & time: 06/09/19  1709    History   Chief Complaint Chief Complaint  Patient presents with   Hip Pain    HPI Luellen Howson is a 48 y.o. female who presents to the ED today complaining of continued pain to her left hip. Pt was seen in the ED yesterday for same; she reports she started a new workout routine and thinks she may have overdone it. She had an xray done yesterday with no acute findings. Pt was discharged home with instructions for RICE therapy as well as anti-inflammatories. Pt reports she does not typically like to take medication; she took 400 mg Ibuprofen earlier this morning without relief. Denies any worsening pain to the hip. No new numbness, weakness, paresthesias.        Past Medical History:  Diagnosis Date   Anemia    BRCA1 positive    c.190T>G (p.Cys64Gly) @ [facility]    Breast cancer ([place]) 02/01/14   ER-/PR-/Her2+, still receiving chemo 12/21/14, last Herceptin 02-02-15.Left breat cancer.   Chest pain    pt states is secondary to bilateral mastectomy   Depression    DVT (deep venous thrombosis) (HCC)    Right Subclavian and IJ.. Lovenox stopped 01-10-15 until after surgery planned 02-08-15.   Dysrhythmia    Endometriosis    Fibroid tumor    02-02-15 remains with abdominal pain and some vaginal bleeding due to fibroids.   GERD (gastroesophageal reflux disease)    History of blood transfusion    last 9'15. due to chemotherapy.   History of chemotherapy    History of staph infection    Hypertension    Iron deficiency anemia, unspecified 02/25/2014   PONV (postoperative nausea and vomiting)    needs scop patch   Rash 02/22/2015   Shortness of breath     when Hemoglobin low, now resolved after transfusions 9'15.; increased exertion as stated per pt during PAT visit 11/08/2016   Wears glasses     Patient Active Problem List   Diagnosis Date Noted   Rib  pain 08/07/2017   Chronic periodontitis 11/05/2016   Adjustment disorder with mixed anxiety and depressed mood 09/20/2016   Left hip pain 07/19/2016   Urination frequency 06/21/2016   Low back pain, non-specific 06/21/2016   Acute deep vein thrombosis (DVT) of proximal end of right lower extremity ([place]) 12/15/2015   Breast cancer, BRCA1 positive ([place]) 12/15/2015   Genetic testing 10/10/2015   Chest pain 08/28/2015   Precordial chest pain 08/18/2015   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   Rash and nonspecific skin eruption 02/22/2015   Long term current use of anticoagulant therapy 02/22/2015   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 02/08/2015   Heavy menstrual bleeding 01/11/2015   New daily persistent headache 12/16/2014   Essential hypertension, benign 12/16/2014   Moderate recurrent major depression ([place]) 12/16/2014   Cardiomyopathy ([place]) 10/20/2014   Cardiomyopathy, ischemic 09/06/2014   Fever 09/02/2014   Neoplasm related pain 09/02/2014   Anemia 08/07/2014   Symptomatic anemia 08/07/2014   Absence of breast 08/06/2014   Breast cancer ([place]) 07/28/2014   Bloodstream infection due to Port-A-Cath 05/30/2014   Acute blood loss anemia 05/20/2014   Staphylococcus aureus bacteremia with sepsis ([place]) 05/19/2014   Sinus tachycardia 05/19/2014   AKI (acute kidney injury) ([place]) 05/19/2014   Palpitations 05/05/2014   BRCA1 positive    Local infection due to Port-A-Cath 03/09/2014   Iron deficiency anemia 02/25/2014  HTN (hypertension) 02/19/2014   Malignant neoplasm of upper-outer quadrant of left breast in female, estrogen receptor negative ([place]) 02/02/2014    Past Surgical History:  Procedure Laterality Date   BILATERAL TOTAL MASTECTOMY WITH AXILLARY LYMPH NODE DISSECTION Bilateral 07/28/2014   Procedure: BILATERAL TOTAL MASTECTOMY WITH LEFT AXILLARY SENTINEL LYMPH NODE BIOPSY;  Surgeon: Stark Klein, MD;  Location: [place] [facility];  Service: General;   Laterality: Bilateral;   BREAST RECONSTRUCTION Bilateral 03/31/2015   Procedure: BILATERAL BREAST RECONSTRUCTION REVISION WITH BILATERAL LIPOFILLING;  Surgeon: Theodoro Kos, DO;  Location: [facility];  Service: Plastics;  Laterality: Bilateral;   BREAST RECONSTRUCTION WITH PLACEMENT OF TISSUE EXPANDER AND FLEX HD (ACELLULAR HYDRATED DERMIS) Bilateral 07/28/2014   Procedure: BILATERAL BREAST RECONSTRUCTION WITH PLACEMENT OF TISSUE EXPANDER AND FLEX HD (ACELLULAR HYDRATED DERMIS);  Surgeon: Theodoro Kos, DO;  Location: [facility];  Service: Plastics;  Laterality: Bilateral;   CARDIAC CATHETERIZATION     10'15- Dr. Sung Amabile   CERVICAL POLYPECTOMY  2010   CESAREAN SECTION     one previous   LEFT HEART CATHETERIZATION WITH CORONARY ANGIOGRAM N/A 09/13/2014   Procedure: LEFT HEART CATHETERIZATION WITH CORONARY ANGIOGRAM;  Surgeon: Jolaine Artist, MD;  Location: [facility] CATH LAB;  Service: Cardiovascular;  Laterality: N/A;   LIPOSUCTION WITH LIPOFILLING Bilateral 03/31/2015   Procedure: LIPOSUCTION WITH LIPOFILLING;  Surgeon: Theodoro Kos, DO;  Location: [facility];  Service: Plastics;  Laterality: Bilateral;   MASTECTOMY     MULTIPLE EXTRACTIONS WITH ALVEOLOPLASTY N/A 11/09/2016   Procedure: Extraction of tooth #'s 1,2,18,30, and 32 with alveoloplasty and gross debridement of remaining teeth.;  Surgeon: [name], DDS;  Location: [facility];  Service: Oral Surgery;  Laterality: N/A;   PORT-A-CATH REMOVAL N/A 05/21/2014   Procedure: REMOVAL PORT-A-CATH;  Surgeon: Zenovia Jarred, MD;  Location: [facility];  Service: General;  Laterality: N/A;   PORTACATH PLACEMENT Right 02/23/2014   Procedure: INSERTION PORT-A-CATH;  Surgeon: Joyice Faster. Cornett, MD;  Location: [facility] [place];  Service: General;  Laterality: Right;   REMOVAL OF TISSUE EXPANDER AND PLACEMENT OF IMPLANT Bilateral 10/14/2014   Procedure: REMOVAL OF BILATERAL BREAST  TISSUE EXPANDER  WITH  PLASCEMENT OF BILATERAL  BREAST IMPLANTS;  Surgeon: Theodoro Kos, DO;  Location: [place];  Service: Plastics;  Laterality: Bilateral;   ROBOTIC ASSISTED TOTAL HYSTERECTOMY WITH BILATERAL SALPINGO OOPHERECTOMY Bilateral 02/08/2015   Procedure: ROBOTIC ASSISTED TOTAL HYSTERECTOMY WITH BILATERAL SALPINGO OOPHORECTOMY; UTERUS WEIGHING GREATER THAN 250 GRAMS;  Surgeon: Everitt Amber, MD;  Location: [facility];  Service: Gynecology;  Laterality: Bilateral;  BRCA 1 GENE MUTATION   TEE WITHOUT CARDIOVERSION N/A 05/26/2014   Procedure: TRANSESOPHAGEAL ECHOCARDIOGRAM (TEE);  Surgeon: Larey Dresser, MD;  Location: [facility] ENDOSCOPY;  Service: Cardiovascular;  Laterality: N/A;   TUBAL LIGATION       OB History    Gravida  5   Para  5   Term  5   Preterm      AB      Living  5     SAB      TAB      Ectopic      Multiple      Live Births               Home Medications    Prior to Admission medications   Medication Sig Start Date End Date Taking? Authorizing Provider  Biotin 5000 MCG TABS Take 10,000 mcg by mouth daily. Reported on 06/21/2016  [provider]  cefUROXime (CEFTIN) 250 MG tablet Take 1 tablet (250 mg total) by mouth 2 (two) times daily with a meal. 12/09/17   Tanner, Lyndon Code., PA-C  lidocaine (XYLOCAINE) 5 % ointment Apply 1 application topically daily as needed. Patient not taking: Reported on 12/09/2017 08/07/17   Tresa Garter, MD  methocarbamol (ROBAXIN) 500 MG tablet Take 1 tablet (500 mg total) by mouth 3 (three) times daily. 12/09/17   Tanner, Lyndon Code., PA-C  pantoprazole (PROTONIX) 40 MG tablet Take 40 mg by mouth daily as needed.    [provider]  predniSONE (DELTASONE) 5 MG tablet 6 tabs x 1 day, 5 tabs x 1 day, 4 tabs x 1 day, 3 tabs x 1 day, 2 tab x 1 day, 1 tab x 1 day 12/09/17   Harle Stanford., PA-C    Family History Family History  Problem Relation Age of Onset   Breast cancer Mother 17       currently 66   Diabetes  Father    Pancreatic cancer Father 64   Breast cancer Paternal Aunt 75       currently 73; BRCA1 positive   Stroke Maternal Grandfather    Cancer Paternal Aunt        unk. primary; deceased 19s   Breast cancer Cousin        daughter of unaffected paternal aunt; dx in her 3s    Social History Social History   Tobacco Use   Smoking status: Former Smoker    Packs/day: 0.25    Years: 12.00    Pack years: 3.00    Types: Cigarettes    Quit date: 02/18/2009    Years since quitting: 10.3   Smokeless tobacco: Never Used  Substance Use Topics   Alcohol use: No   Drug use: Yes    Frequency: 2.0 times per week    Types: Marijuana    Comment: last use 11/07/2016     Allergies   Compazine [prochlorperazine edisylate]   Review of Systems Review of Systems  Constitutional: Negative for fever.  Musculoskeletal: Positive for arthralgias.     Physical Exam Updated Vital Signs BP (!) 134/100 (BP Location: Right Arm)    Pulse 73    Temp 99.1 F (37.3 C) (Oral)    Resp 16    Ht 5' 5"  (1.651 m)    Wt 103.4 kg    LMP 04/04/2014 Comment: neg preg today   SpO2 99%    BMI 37.94 kg/m   Physical Exam Vitals signs and nursing note reviewed.  Constitutional:      Appearance: She is not ill-appearing.  HENT:     Head: Normocephalic and atraumatic.  Eyes:     Conjunctiva/sclera: Conjunctivae normal.  Cardiovascular:     Rate and Rhythm: Normal rate and regular rhythm.  Pulmonary:     Effort: Pulmonary effort is normal.     Breath sounds: Normal breath sounds.  Musculoskeletal:     Comments: Tenderness to palpation over left hip; no bony deformities, swelling, or ecchymosis appreciated. ROM slightly limited due to pain. Strength and sensation intact throughout. 2+ distal pulses.   Skin:    General: Skin is warm and dry.     Coloration: Skin is not jaundiced.  Neurological:     Mental Status: She is alert.      ED Treatments / Results  Labs (all labs ordered are  listed, but only abnormal results are displayed) Labs Reviewed - No data  to display  EKG None  Radiology Dg Hip Unilat With Pelvis 2-3 Views Left  Result Date: 06/08/2019 CLINICAL DATA:  Left hip pain. EXAM: DG HIP (WITH OR WITHOUT PELVIS) 2-3V LEFT COMPARISON:  None. FINDINGS: There is no acute displaced fracture or dislocation. There are mild degenerative changes of both hips. Presumed phleboliths project over the patient's pelvis. IMPRESSION: No acute osseous abnormality. Electronically Signed   By: Constance Holster M.D.   On: 06/08/2019 21:14    Procedures Procedures (including critical care time)  Medications Ordered in ED Medications  ketorolac (TORADOL) 30 MG/ML injection 30 mg (30 mg Intramuscular Given 06/09/19 2208)     Initial Impression / Assessment and Plan / ED Course  I have reviewed the triage vital signs and the nursing notes.  Pertinent labs & imaging results that were available during my care of the patient were reviewed by me and considered in my medical decision making (see chart for details).    48 year old female presenting to the ED with continued pain to left hip, seen in the ED yesterday for same with negative xray. Requesting toradol injection today; will oblige but advised patient that she will need to take medications at home to help with this as well as RICE therapy. She should also follow up with her PCP. She is in agreement with plan at this time and stable for discharge home.        Final Clinical Impressions(s) / ED Diagnoses   Final diagnoses:  Left hip pain    ED Discharge Orders    None       Eustaquio Maize, PA-C 06/10/19 0152    Hayden Rasmussen, MD 06/10/19 [phone]

## 2019-06-09 NOTE — Discharge Instructions (Addendum)
Please take Ibuprofen and Tylenol as needed for the pain. Please also rest, ice, and elevate your leg for comfort. Please follow up with your PCP regarding your ED visits.

## 2019-06-22 ENCOUNTER — Encounter: Payer: Self-pay | Admitting: Nurse Practitioner

## 2019-06-22 ENCOUNTER — Ambulatory Visit: Payer: Self-pay | Attending: Nurse Practitioner | Admitting: Nurse Practitioner

## 2019-06-22 ENCOUNTER — Other Ambulatory Visit: Payer: Self-pay

## 2019-06-22 DIAGNOSIS — Z7689 Persons encountering health services in other specified circumstances: Secondary | ICD-10-CM

## 2019-06-22 DIAGNOSIS — M25552 Pain in left hip: Secondary | ICD-10-CM

## 2019-06-22 NOTE — Progress Notes (Signed)
Virtual Visit via Telephone Note Due to national recommendations of social distancing due to [place] 19, telehealth visit is felt to be most appropriate for this patient at this time.  I discussed the limitations, risks, security and privacy concerns of performing an evaluation and management service by telephone and the availability of in person appointments. I also discussed with the patient that there may be a patient responsible charge related to this service. The patient expressed understanding and agreed to proceed.    I connected with Tamara [name] on 06/22/19  at   9:50 AM EDT  EDT by telephone and verified that I am speaking with the correct person using two identifiers.   Consent I discussed the limitations, risks, security and privacy concerns of performing an evaluation and management service by telephone and the availability of in person appointments. I also discussed with the patient that there may be a patient responsible charge related to this service. The patient expressed understanding and agreed to proceed.   Location of Patient: Private Residence   Location of Provider: [place] [facility] [employer] Office    Persons participating in Telemedicine visit: [name]    History of Present Illness: Telemedicine visit for: Establish care  has a past medical history of Anemia, BRCA1 positive, Breast cancer ([place]) (02/01/14), Chest pain, Depression, DVT (deep venous thrombosis) ([place]), Dysrhythmia, Endometriosis, Fibroid tumor, GERD (gastroesophageal reflux disease), History of blood transfusion, History of chemotherapy, History of staph infection, Hypertension, Iron deficiency anemia, unspecified (02/25/2014), PONV (postoperative nausea and vomiting), Rash (02/22/2015), Shortness of breath, and Wears glasses. Breast cancer status post bilateral mastectomy and total abdominal hysterectomy/bilateral oophorectomy. Being followed by Dr. Jana Hakim  with Oncology. She has been scheduled for Pelvic MRI by oncology however she has not had the MRI completed and was a no show for her appointment. She also has not applied for the CAFA here.    She is requesting that I fill out FMLA for her job. 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 Took herself out of work on 06-15-2019 due to left hip pain.  She has had ongoing hip pain for several years. Was referred to physical therapy however per PT NOTE 07-31-2016:Ms Simms arrived today for assessment of her LT hip pain . She was informed that Medicaid would not pay for treatment visits  after the evaluation  with her diagnosis. She was offered to be seen self pay but was not happy with that prospect and after answering the initial questions of intake she decided to leave and not complete the evaluation  stating she was a cancer survivor. Unfortunately, Medicaid does not pay for this treatment and I think that upset her. Sorry we were not able to help her.     06-08-2019 DG HIP (WITH OR WITHOUT PELVIS) 2-3V LEFT COMPARISON:  None.  FINDINGS: There is no acute displaced fracture or dislocation. There are mild degenerative changes of both hips. Presumed phleboliths project over the patient's pelvis.  IMPRESSION: No acute osseous abnormality.  I explained to her that I would not be able to fill out any FMLA forms as she has not been seen in this office in a few years and based on review of recent xray I could not warrant a reason to have her out of work. I offered to compose a letter that would allow her to perform only light duty activities at work. She declined this letter and stated her job does not offer light  duty.  She also declines pain medication: Tramadol/Tylenol #3 for her hip pain to help her get back to work.   PER RECENT ED NOTE: 06-09-2019 48 year old female presenting to the ED with continued pain to left hip, seen in the ED yesterday for same with negative xray. Requesting toradol injection  today; will oblige but advised patient that she will need to take medications at home to help with this as well as RICE therapy. She should also follow up with her PCP. She is in agreement with plan at this time and stable for discharge home.    She was very upset and stated there was no other reason to continue the call and she thanked me and abruptly ended the phone call.   Past Medical History:  Diagnosis Date  . Anemia   . BRCA1 positive    c.190T>G (p.Cys64Gly) @ [facility]   . Breast cancer ([place]) 02/01/14   ER-/PR-/Her2+, still receiving chemo 12/21/14, last Herceptin 02-02-15.Left breat cancer.  . Chest pain    pt states is secondary to bilateral mastectomy  . Depression   . DVT (deep venous thrombosis) (HCC)    Right Subclavian and IJ.. Lovenox stopped 01-10-15 until after surgery planned 02-08-15.  Marland Kitchen Dysrhythmia   . Endometriosis   . Fibroid tumor    02-02-15 remains with abdominal pain and some vaginal bleeding due to fibroids.  Marland Kitchen GERD (gastroesophageal reflux disease)   . History of blood transfusion    last 9'15. due to chemotherapy.  Marland Kitchen History of chemotherapy   . History of staph infection   . Hypertension   . Iron deficiency anemia, unspecified 02/25/2014  . PONV (postoperative nausea and vomiting)    needs scop patch  . Rash 02/22/2015  . Shortness of breath     when Hemoglobin low, now resolved after transfusions 9'15.; increased exertion as stated per pt during PAT visit 11/08/2016  . Wears glasses     Past Surgical History:  Procedure Laterality Date  . BILATERAL TOTAL MASTECTOMY WITH AXILLARY LYMPH NODE DISSECTION Bilateral 07/28/2014   Procedure: BILATERAL TOTAL MASTECTOMY WITH LEFT AXILLARY SENTINEL LYMPH NODE BIOPSY;  Surgeon: Stark Klein, MD;  Location: [facility];  Service: General;  Laterality: Bilateral;  . BREAST RECONSTRUCTION Bilateral 03/31/2015   Procedure: BILATERAL BREAST RECONSTRUCTION REVISION WITH BILATERAL LIPOFILLING;  Surgeon: Theodoro Kos, DO;  Location: [place];  Service: Plastics;  Laterality: Bilateral;  . BREAST RECONSTRUCTION WITH PLACEMENT OF TISSUE EXPANDER AND FLEX HD (ACELLULAR HYDRATED DERMIS) Bilateral 07/28/2014   Procedure: BILATERAL BREAST RECONSTRUCTION WITH PLACEMENT OF TISSUE EXPANDER AND FLEX HD (ACELLULAR HYDRATED DERMIS);  Surgeon: Theodoro Kos, DO;  Location: [place];  Service: Plastics;  Laterality: Bilateral;  . CARDIAC CATHETERIZATION     10'15- Dr. Sung Amabile  . CERVICAL POLYPECTOMY  2010  . CESAREAN SECTION     one previous  . LEFT HEART CATHETERIZATION WITH CORONARY ANGIOGRAM N/A 09/13/2014   Procedure: LEFT HEART CATHETERIZATION WITH CORONARY ANGIOGRAM;  Surgeon: Jolaine Artist, MD;  [facility] LAB;  Service: Cardiovascular;  Laterality: N/A;  . LIPOSUCTION WITH LIPOFILLING Bilateral 03/31/2015   Procedure: LIPOSUCTION WITH LIPOFILLING;  Surgeon: Theodoro Kos, DO;  Location: [place];  Service: Plastics;  Laterality: Bilateral;  . MASTECTOMY    . MULTIPLE EXTRACTIONS WITH ALVEOLOPLASTY N/A 11/09/2016   Procedure: Extraction of tooth #'s 1,2,18,30, and 32 with alveoloplasty and gross debridement of remaining teeth.;  Surgeon: [name], DDS;  Location[facility];  Service: Oral Surgery;  Laterality: N/A;  . PORT-A-CATH REMOVAL N/A 05/21/2014   Procedure: REMOVAL PORT-A-CATH;  Surgeon: Zenovia Jarred, MD;  Location: [place];  Service: General;  Laterality: N/A;  . PORTACATH PLACEMENT Right 02/23/2014   Procedure: INSERTION PORT-A-CATH;  Surgeon: Joyice Faster. Cornett, MD;  Location: [place];  Service: General;  Laterality: Right;  . REMOVAL OF TISSUE EXPANDER AND PLACEMENT OF IMPLANT Bilateral 10/14/2014   Procedure: REMOVAL OF BILATERAL BREAST  TISSUE EXPANDER  WITH PLASCEMENT OF BILATERAL  BREAST IMPLANTS;  Surgeon: Theodoro Kos, DO;  Location: [place];  Service: Plastics;  Laterality: Bilateral;  . ROBOTIC ASSISTED TOTAL HYSTERECTOMY WITH  BILATERAL SALPINGO OOPHERECTOMY Bilateral 02/08/2015   Procedure: ROBOTIC ASSISTED TOTAL HYSTERECTOMY WITH BILATERAL SALPINGO OOPHORECTOMY; UTERUS WEIGHING GREATER THAN 250 GRAMS;  Surgeon: Everitt Amber, MD;  Location: [facility];  Service: Gynecology;  Laterality: Bilateral;  BRCA 1 GENE MUTATION  . TEE WITHOUT CARDIOVERSION N/A 05/26/2014   Procedure: TRANSESOPHAGEAL ECHOCARDIOGRAM (TEE);  Surgeon: Larey Dresser, MD;  Location: [facility] ENDOSCOPY;  Service: Cardiovascular;  Laterality: N/A;  . TUBAL LIGATION      Family History  Problem Relation Age of Onset  . Breast cancer Mother 62       currently 77  . Diabetes Father   . Pancreatic cancer Father 86  . Breast cancer Paternal Aunt 45       currently 42; BRCA1 positive  . Stroke Maternal Grandfather   . Cancer Paternal Aunt        unk. primary; deceased 40s  . Breast cancer Cousin        daughter of unaffected paternal aunt; dx in her 58s    Social History   Socioeconomic History  . Marital status: Legally Separated    Spouse name: Not on file  . Number of children: 5  . Years of education: Not on file  . Highest education level: Not on file  Occupational History  . Not on file  Social Needs  . Financial resource strain: Not on file  . Food insecurity    Worry: Not on file    Inability: Not on file  . Transportation needs    Medical: Not on file    Non-medical: Not on file  Tobacco Use  . Smoking status: Former Smoker    Packs/day: 0.25    Years: 12.00    Pack years: 3.00    Types: Cigarettes    Quit date: 02/18/2009    Years since quitting: 10.3  . Smokeless tobacco: Never Used  Substance and Sexual Activity  . Alcohol use: No  . Drug use: Not Currently    Frequency: 2.0 times per week    Types: Marijuana    Comment: last use 11/07/2016  . Sexual activity: Not Currently    Birth control/protection: Surgical  Lifestyle  . Physical activity    Days per week: Not on file    Minutes per session: Not on file  . Stress: Not  on file  Relationships  . Social Herbalist on phone: Not on file    Gets together: Not on file    Attends religious service: Not on file    Active member of club or organization: Not on file    Attends meetings of clubs or organizations: Not on file    Relationship status: Not on file  Other Topics Concern  . Not on file  Social History Narrative  . Not on file  Observations/Objective: Awake, alert and oriented x 3   Review of Systems  Constitutional: Negative for fever, malaise/fatigue and weight loss.  HENT: Negative.  Negative for nosebleeds.   Eyes: Negative.  Negative for blurred vision, double vision and photophobia.  Respiratory: Negative.  Negative for cough and shortness of breath.   Cardiovascular: Negative.  Negative for chest pain, palpitations and leg swelling.  Gastrointestinal: Negative.  Negative for heartburn, nausea and vomiting.  Musculoskeletal: Positive for joint pain. Negative for myalgias.       SEE HPI  Neurological: Negative.  Negative for dizziness, focal weakness, seizures and headaches.  Psychiatric/Behavioral: Negative.  Negative for suicidal ideas.    Assessment and Plan: Phoebe was seen today for hospitalization follow-up.  Diagnoses and all orders for this visit:  Left hip pain  Encounter to establish care     Follow Up Instructions Return if symptoms worsen or fail to improve.     I discussed the assessment and treatment plan with the patient. The patient was provided an opportunity to ask questions and all were answered. The patient agreed with the plan and demonstrated an understanding of the instructions.   The patient was advised to call back or seek an in-person evaluation if the symptoms worsen or if the condition fails to improve as anticipated.  I provided 18 minutes of non-face-to-face time during this encounter including median intraservice time, reviewing previous notes, labs, imaging, medications and  explaining diagnosis and management.  Gildardo Pounds, FNP-BC

## 2019-06-27 ENCOUNTER — Encounter: Payer: Self-pay | Admitting: Nurse Practitioner

## 2019-09-09 ENCOUNTER — Other Ambulatory Visit: Payer: Self-pay

## 2019-09-09 DIAGNOSIS — Z171 Estrogen receptor negative status [ER-]: Secondary | ICD-10-CM

## 2019-09-09 DIAGNOSIS — C50412 Malignant neoplasm of upper-outer quadrant of left female breast: Secondary | ICD-10-CM

## 2019-09-09 NOTE — Progress Notes (Signed)
[facility]

## 2019-09-09 NOTE — Progress Notes (Signed)
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 She continues under observation.   She had some left hip pain problems few months ago.  She was seen in the emergency room and there was some confusion because she initially refused pain medicine and then came back for pain medicine and in general she tells me she just was not satisfied with the way she was treated.  However her left hip is now much better.  She thinks the reason on the problem arose was because she was walking up to 7 miles a day and was not stretching.  She is now just beginning to work-up to shorter walks which of course is a very good idea.  REVIEW OF SYSTEMS: Trace has had no unusual headaches visual changes cough phlegm production pleurisy shortness of breath or change in bowel or bladder habits.  Detailed review of systems today was otherwise stable.   BREAST CANCER HISTORY:   From Dr Dana Allan original intake note:   "Patient found a left breast mass in the upper outer quadrant, evaluated with mammogram at [facility] 01-18-14 with a 3.2 cm mass in the 2:00 position 7 cm from the nipple. There was also an 8 mm mass 8 cm from the nipple in the ipsilateral breast, as well as positive lymph nodes. 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 MRI confirmed 2.8 and 1.3 cm mass is as well as lymph node. On the right a 6  mm nodule, biopsied and negative for malignancy. PET scan 9-38-10 had hypermetabolic left breast mass consistent with known neoplasm and FDG positive left axillary lymph nodes,benign-appearing brown fat activity and muscular activity in the  neck and chest but no findings for metastatic disease involving the neck, chest, abdomen, pelvis or bones. Moderate FDG activity in the endometrial canal is thought likely due to secretory phase of ovulation or menses. No mass, uterine fibroids present. CT CAP 02-19-14 had 3 cm left breast mass, enlarged left axillary lymph nodes are positive and no CT findings for metastatic disease involving the chest,abdomen or pelvis and no evidence of osseous metastatic disease. Mildly enlarged fibroid uterus."   Her subsequent treatment is as detailed below   PAST MEDICAL HISTORY: Past Medical History:  Diagnosis Date  . Anemia   . BRCA1 positive    c.190T>G (p.Cys64Gly) @ [facility]   . Breast cancer ([place]) 02/01/14   ER-/PR-/Her2+, still receiving chemo 12/21/14, last Herceptin 02-02-15.Left breat cancer.  . Chest pain    pt states is secondary to bilateral mastectomy  . Depression   . DVT (deep venous thrombosis) ([facility])    Right Subclavian and IJ.. Lovenox stopped 01-10-15 until after surgery planned 02-08-15.  Marland Kitchen Dysrhythmia   . Endometriosis   . Fibroid tumor    02-02-15 remains with abdominal pain and some vaginal bleeding due to fibroids.  Marland Kitchen GERD (gastroesophageal reflux disease)   . History of blood transfusion    last 9'15. due to chemotherapy.  Marland Kitchen History  of chemotherapy   . History of staph infection   . Hypertension   . Iron deficiency anemia, unspecified 02/25/2014  . PONV (postoperative nausea and vomiting)    needs scop patch  . Rash 02/22/2015  . Shortness of breath     when Hemoglobin low, now resolved after transfusions 9'15.; increased exertion as stated per pt during PAT visit 11/08/2016  . Wears glasses     PAST SURGICAL HISTORY: Past Surgical History:   Procedure Laterality Date  . BILATERAL TOTAL MASTECTOMY WITH AXILLARY LYMPH NODE DISSECTION Bilateral 07/28/2014   Procedure: BILATERAL TOTAL MASTECTOMY WITH LEFT AXILLARY SENTINEL LYMPH NODE BIOPSY;  Surgeon: Stark Klein, MD;  Location: [place];  Service: General;  Laterality: Bilateral;  . BREAST RECONSTRUCTION Bilateral 03/31/2015   Procedure: BILATERAL BREAST RECONSTRUCTION REVISION WITH BILATERAL LIPOFILLING;  Surgeon: Theodoro Kos, DO;  Location: [place];  Service: Plastics;  Laterality: Bilateral;  . BREAST RECONSTRUCTION WITH PLACEMENT OF TISSUE EXPANDER AND FLEX HD (ACELLULAR HYDRATED DERMIS) Bilateral 07/28/2014   Procedure: BILATERAL BREAST RECONSTRUCTION WITH PLACEMENT OF TISSUE EXPANDER AND FLEX HD (ACELLULAR HYDRATED DERMIS);  Surgeon: Theodoro Kos, DO;  Location: [place];  Service: Plastics;  Laterality: Bilateral;  . CARDIAC CATHETERIZATION     10'15- Dr. Sung Amabile  . CERVICAL POLYPECTOMY  2010  . CESAREAN SECTION     one previous  . LEFT HEART CATHETERIZATION WITH CORONARY ANGIOGRAM N/A 09/13/2014   Procedure: LEFT HEART CATHETERIZATION WITH CORONARY ANGIOGRAM;  Surgeon: Jolaine Artist, MD;  Location[facility];  Service: Cardiovascular;  Laterality: N/A;  . LIPOSUCTION WITH LIPOFILLING Bilateral 03/31/2015   Procedure: LIPOSUCTION WITH LIPOFILLING;  Surgeon: Theodoro Kos, DO;  Location: [place];  Service: Plastics;  Laterality: Bilateral;  . MASTECTOMY    . MULTIPLE EXTRACTIONS WITH ALVEOLOPLASTY N/A 11/09/2016   Procedure: Extraction of tooth #'s 1,2,18,30, and 32 with alveoloplasty and gross debridement of remaining teeth.;  Surgeon: [name], DDS;  Location: [facility];  Service: Oral Surgery;  Laterality: N/A;  . PORT-A-CATH REMOVAL N/A 05/21/2014   Procedure: REMOVAL PORT-A-CATH;  Surgeon: Zenovia Jarred, MD;  Location: [place];  Service: General;  Laterality: N/A;  . PORTACATH PLACEMENT Right 02/23/2014   Procedure: INSERTION  PORT-A-CATH;  Surgeon: Joyice Faster. Cornett, MD;  Location: [place];  Service: General;  Laterality: Right;  . REMOVAL OF TISSUE EXPANDER AND PLACEMENT OF IMPLANT Bilateral 10/14/2014   Procedure: REMOVAL OF BILATERAL BREAST  TISSUE EXPANDER  WITH PLASCEMENT OF BILATERAL  BREAST IMPLANTS;  Surgeon: Theodoro Kos, DO;  Location: [place];  Service: Plastics;  Laterality: Bilateral;  . ROBOTIC ASSISTED TOTAL HYSTERECTOMY WITH BILATERAL SALPINGO OOPHERECTOMY Bilateral 02/08/2015   Procedure: ROBOTIC ASSISTED TOTAL HYSTERECTOMY WITH BILATERAL SALPINGO OOPHORECTOMY; UTERUS WEIGHING GREATER THAN 250 GRAMS;  Surgeon: Everitt Amber, MD;  Location: [facility];  Service: Gynecology;  Laterality: Bilateral;  BRCA 1 GENE MUTATION  . TEE WITHOUT CARDIOVERSION N/A 05/26/2014   Procedure: TRANSESOPHAGEAL ECHOCARDIOGRAM (TEE);  Surgeon: Larey Dresser, MD;  Location: [place];  Service: Cardiovascular;  Laterality: N/A;  . TUBAL LIGATION      FAMILY HISTORY Family History  Problem Relation Age of Onset  . Breast cancer Mother 54       currently 85  . Diabetes Father   . Pancreatic cancer Father 62  . Breast cancer Paternal Aunt 76       currently 81; BRCA1 positive  . Stroke Maternal Grandfather   . Cancer Paternal  Aunt        unk. primary; deceased 70s  . Breast cancer Cousin        daughter of unaffected paternal aunt; dx in her 62s  [name] father died from prostate cancer the age of 4. [name] mother was diagnosed with breast cancer the age of 8. [name] father had 5 sisters, 3 of whom were diagnosed with breast cancer, 2 of them before the age of 45. The patient had one brother, no sisters. There is no history of ovarian cancer in the family.   GYNECOLOGIC HISTORY:  Menarche age 62, first live birth age 55, the patient is T[place] P5. Her periods stopped at the time of chemotherapy. 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 Her (former) husband [name] works as an Animal nutritionist. Their divorce was finalized September 2016. [name] oldest child, a son, [name], is studying Therapist, occupational; the patient is a 4 year old daughter [name] is also in college. [name] younger children are 62, 82, and 62. The patient attends a [employer]    ADVANCED DIRECTIVES: Not in place; at the 12/15/2015 visit the patient was given the appropriate forms to complete and notarize at her discretion   HEALTH MAINTENANCE: Social History   Tobacco Use  . Smoking status: Former Smoker    Packs/day: 0.25    Years: 12.00    Pack years: 3.00    Types: Cigarettes    Quit date: 02/18/2009    Years since quitting: 10.5  . Smokeless tobacco: Never Used  Substance Use Topics  . Alcohol use: No  . Drug use: Not Currently    Frequency: 2.0 times per week    Types: Marijuana    Comment: last use 11/07/2016    Allergies  Allergen Reactions  . Compazine [Prochlorperazine Edisylate] Other (See Comments)    Stuttering    Current Outpatient Medications  Medication Sig Dispense Refill  . Biotin 5000 MCG TABS Take 10,000 mcg by mouth daily. Reported on 06/21/2016    . cefUROXime (CEFTIN) 250 MG tablet Take 1 tablet (250 mg total) by mouth 2 (two) times daily with a meal. (Patient not taking: Reported on 06/22/2019) 20 tablet 0  . lidocaine (XYLOCAINE) 5 % ointment Apply 1 application topically daily as needed. (Patient not taking: Reported on 12/09/2017) 50 g 2  . methocarbamol (ROBAXIN) 500 MG tablet Take 1 tablet (500 mg total) by mouth 3 (three) times daily. (Patient not taking: Reported on 06/22/2019) 21 tablet 0  . pantoprazole (PROTONIX) 40 MG tablet Take 40 mg by mouth daily as needed.    . predniSONE (DELTASONE) 5 MG tablet 6 tabs x 1 day, 5 tabs x 1 day, 4 tabs x 1 day, 3 tabs x 1 day, 2 tab x 1 day, 1 tab x 1 day (Patient not  taking: Reported on 06/22/2019) 21 tablet 0  . RASPBERRY PO Take by mouth.    . Turmeric (QC TUMERIC COMPLEX PO) Take by mouth.     No current facility-administered medications for this visit.     OBJECTIVE: Middle-aged African-American woman in no acute distress  Vitals:   09/10/19 1419  BP: (!) 148/89  Pulse: 72  Temp: 98.7 F (37.1 C)  SpO2: 99%     Body mass index is 39.59 kg/m.      ECOG FS:1 - Symptomatic but completely ambulatory  Sclerae unicteric,  EOMs intact Wearing a mask No cervical or supraclavicular adenopathy Lungs no rales or rhonchi Heart regular rate and rhythm Abd soft, nontender, positive bowel sounds MSK no focal spinal tenderness, no upper extremity lymphedema; normal ambulation Neuro: nonfocal, well oriented, appropriate affect Breasts: Status post bilateral mastectomies with bilateral reconstruction.  There is no evidence of local recurrence.   LAB RESULTS:  CMP     Component Value Date/Time   NA 144 09/05/2018 1346   NA 141 06/17/2017 1037   K 3.6 09/05/2018 1346   K 4.0 06/17/2017 1037   CL 107 09/05/2018 1346   CO2 27 09/05/2018 1346   CO2 26 06/17/2017 1037   GLUCOSE 101 (H) 09/05/2018 1346   GLUCOSE 90 06/17/2017 1037   BUN 11 09/05/2018 1346   BUN 12.8 06/17/2017 1037   CREATININE 0.90 09/05/2018 1346   CREATININE 1.0 06/17/2017 1037   CALCIUM 9.3 09/05/2018 1346   CALCIUM 10.0 06/17/2017 1037   PROT 6.7 09/05/2018 1346   PROT 7.2 06/17/2017 1037   ALBUMIN 3.8 09/05/2018 1346   ALBUMIN 4.1 06/17/2017 1037   AST 16 09/05/2018 1346   AST 16 06/17/2017 1037   ALT 17 09/05/2018 1346   ALT 17 06/17/2017 1037   ALKPHOS 90 09/05/2018 1346   ALKPHOS 89 06/17/2017 1037   BILITOT 0.4 09/05/2018 1346   BILITOT 0.36 06/17/2017 1037   GFRNONAA >60 09/05/2018 1346   GFRAA >60 09/05/2018 1346    I No results found for: SPEP  Lab Results  Component Value Date   WBC 9.9 09/10/2019   NEUTROABS 6.5 09/10/2019   HGB 12.4 09/10/2019    HCT 38.4 09/10/2019   MCV 88.5 09/10/2019   PLT 363 09/10/2019        Chemistry      Component Value Date/Time   NA 144 09/05/2018 1346   NA 141 06/17/2017 1037   K 3.6 09/05/2018 1346   K 4.0 06/17/2017 1037   CL 107 09/05/2018 1346   CO2 27 09/05/2018 1346   CO2 26 06/17/2017 1037   BUN 11 09/05/2018 1346   BUN 12.8 06/17/2017 1037   CREATININE 0.90 09/05/2018 1346   CREATININE 1.0 06/17/2017 1037      Component Value Date/Time   CALCIUM 9.3 09/05/2018 1346   CALCIUM 10.0 06/17/2017 1037   ALKPHOS 90 09/05/2018 1346   ALKPHOS 89 06/17/2017 1037   AST 16 09/05/2018 1346   AST 16 06/17/2017 1037   ALT 17 09/05/2018 1346   ALT 17 06/17/2017 1037   BILITOT 0.4 09/05/2018 1346   BILITOT 0.36 06/17/2017 1037       No results found for: LABCA2  No components found for: LABCA125  No results for input(s): INR in the last 168 hours.  Urinalysis    Component Value Date/Time   COLORURINE YELLOW 08/23/2018 1626   APPEARANCEUR CLEAR 08/23/2018 1626   LABSPEC 1.026 08/23/2018 1626   LABSPEC 1.025 02/16/2015 1243   PHURINE 7.0 08/23/2018 1626   GLUCOSEU NEGATIVE 08/23/2018 1626   GLUCOSEU Negative 02/16/2015 1243   HGBUR NEGATIVE 08/23/2018 1626   BILIRUBINUR NEGATIVE 08/23/2018 1626   BILIRUBINUR negative 06/21/2016 1120   BILIRUBINUR Negative 02/16/2015 1243   KETONESUR 5 (A) 08/23/2018 1626   PROTEINUR NEGATIVE 08/23/2018 1626   UROBILINOGEN 0.2 06/21/2016 1120   UROBILINOGEN 0.2 02/16/2015 1243   NITRITE NEGATIVE 08/23/2018 1626   LEUKOCYTESUR TRACE (A) 08/23/2018 1626   LEUKOCYTESUR Moderate 02/16/2015 1243    STUDIES: No results found.   Assessment:  48 y.o. BRCA-1 positive [place] woman  1. S/p left breast upper outer quadrant biopsy of two separate breast masses and one axillary lymph node 01/29/2014 for a , clinical mT2 N1 stage IIB, invasive ductal carcinoma, grade 2-3,  estrogen and progesterone receptor negative, with an MIB-1 between  79-100%, and HER 2 amplified  2. completed 6 cycles of carboplatin, docetaxel, trastuzumab and pertuzumab 06/30/2014 with MRI 07/07/2014 showing a complete radiologic response  3. trastuzumab was to be continued to complete a year (through March 2016); however echocardiogram 07/05/2014 showed an ejection fraction of 45-50%-- trastuzumab was held after 06/30/2014 dose until EF recovery, resumed 11/09/2014  (a) cath report from 09/13/2014 shows normal coronaries and a normal left ventricular function with an ejection fraction of 55%.  (b) echo 03/01/2015 suggest continuing mild cardiomyopathy: Trastuzumab discontinued--final dose 02/22/2015  (c) echocardiogram 01/10/2016 shows an ejection fraction of 45-50%  4. 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   5. MRSA port infection and septicemia mid-June 2015;  Port was removed, and  PICC line placed, completed antibiotic therapy with ANCEF; PICC subsequently pulled   6 genetic testing with the [facility] panel and was found to have a pathogenic mutation in the BRCA1 gene called c.190T>G (p.Cys64Gly  7. status post bilateral mastectomies and left axillary sentinel lymph node biopsy, withimmediate expander placement, on 07/28/14; the pathology ([id number]) showed a complete pathologic response in the left breast and the 3 sentinel lymph nodes sampled; the right breast was benign  (a) definitive implant placement bilaterally 10/14/2014   Left - Mentor memory shape medium height high profile 390cc. Ref #[id number].  Serial Number [id number]   Right - Mentor memory shape medium height high profile 390cc. Ref #[id number].  Serial Number [id number]  (b) bilateral breast reconstruction revision 03/31/2015  8. Bilateral salpingo-oophorectomy and hysterectomy 02/08/15 with benign pathology  PLAN:  Maureena is now a little over 5 years out from definitive surgery for her breast cancer with no evidence of disease  recurrence.  This is very favorable.  She does carry a BRCA mutation.  She tells me that 1 of her older children, a daughter, is positive, and is being followed by daughters physician.  Her older son is negative.  This 3 younger children have not been tested.  The other family members are aware of the mutation and they may or may not choose to be tested she tells me at their discretion.  She knows that we will be glad to accommodate her children if and when they decide to become tested, through our genetics counselors here.  At this point though over 5 years out from her definitive surgery I feel comfortable releasing her from follow-up.  All she will need in terms of breast cancer screening in the future is physician breast exam and yearly.  I will be glad to see her again at any point in the future if and when the need arises but as of now are making no further routine appointments for her here.  , Virgie Dad, MD  09/10/19 2:48 PM Medical Oncology and Hematology [facility] [address] [place], [place] [address] Tel. [phone]    Fax. [phone]   I, Wilburn Mylar, am acting as scribe for Dr. Virgie Dad. .  I, Lurline Del MD, have reviewed the above documentation for accuracy and completeness, and I agree with the above.

## 2019-09-10 ENCOUNTER — Inpatient Hospital Stay: Payer: Self-pay | Attending: Oncology

## 2019-09-10 ENCOUNTER — Other Ambulatory Visit: Payer: Self-pay

## 2019-09-10 ENCOUNTER — Inpatient Hospital Stay (HOSPITAL_BASED_OUTPATIENT_CLINIC_OR_DEPARTMENT_OTHER): Payer: Self-pay | Admitting: Oncology

## 2019-09-10 ENCOUNTER — Other Ambulatory Visit: Payer: Self-pay | Admitting: *Deleted

## 2019-09-10 VITALS — BP 148/89 | HR 72 | Temp 98.7°F | Ht 65.0 in | Wt 237.9 lb

## 2019-09-10 DIAGNOSIS — Z1501 Genetic susceptibility to malignant neoplasm of breast: Secondary | ICD-10-CM

## 2019-09-10 DIAGNOSIS — C50412 Malignant neoplasm of upper-outer quadrant of left female breast: Secondary | ICD-10-CM

## 2019-09-10 DIAGNOSIS — Z9013 Acquired absence of bilateral breasts and nipples: Secondary | ICD-10-CM | POA: Insufficient documentation

## 2019-09-10 DIAGNOSIS — Z1509 Genetic susceptibility to other malignant neoplasm: Secondary | ICD-10-CM | POA: Insufficient documentation

## 2019-09-10 DIAGNOSIS — C50919 Malignant neoplasm of unspecified site of unspecified female breast: Secondary | ICD-10-CM

## 2019-09-10 DIAGNOSIS — Z853 Personal history of malignant neoplasm of breast: Secondary | ICD-10-CM | POA: Insufficient documentation

## 2019-09-10 DIAGNOSIS — Z86718 Personal history of other venous thrombosis and embolism: Secondary | ICD-10-CM | POA: Insufficient documentation

## 2019-09-10 DIAGNOSIS — Z171 Estrogen receptor negative status [ER-]: Secondary | ICD-10-CM

## 2019-09-10 LAB — CMP (CANCER CENTER ONLY)
ALT: 13 U/L (ref 0–44)
AST: 12 U/L — ABNORMAL LOW (ref 15–41)
Albumin: 3.7 g/dL (ref 3.5–5.0)
Alkaline Phosphatase: 90 U/L (ref 38–126)
Anion gap: 8 (ref 5–15)
BUN: 20 mg/dL (ref 6–20)
CO2: 25 mmol/L (ref 22–32)
Calcium: 9 mg/dL (ref 8.9–10.3)
Chloride: 108 mmol/L (ref 98–111)
Creatinine: 0.97 mg/dL (ref 0.44–1.00)
GFR, Est AFR Am: >60 mL/min (ref >60)
GFR, Estimated: >60 mL/min (ref >60)
Glucose, Bld: 95 mg/dL (ref 70–99)
Potassium: 3.9 mmol/L (ref 3.5–5.1)
Sodium: 141 mmol/L (ref 135–145)
Total Bilirubin: 0.3 mg/dL (ref 0.3–1.2)
Total Protein: 6.9 g/dL (ref 6.5–8.1)

## 2019-09-10 LAB — CBC WITH DIFFERENTIAL/PLATELET
Abs Immature Granulocytes: 0.03 10*3/uL (ref 0.00–0.07)
Basophils Absolute: 0 10*3/uL (ref 0.0–0.1)
Basophils Relative: 0 %
Eosinophils Absolute: 0.1 10*3/uL (ref 0.0–0.5)
Eosinophils Relative: 1 %
HCT: 38.4 % (ref 36.0–46.0)
Hemoglobin: 12.4 g/dL (ref 12.0–15.0)
Immature Granulocytes: 0 %
Lymphocytes Relative: 27 %
Lymphs Abs: 2.6 10*3/uL (ref 0.7–4.0)
MCH: 28.6 pg (ref 26.0–34.0)
MCHC: 32.3 g/dL (ref 30.0–36.0)
MCV: 88.5 fL (ref 80.0–100.0)
Monocytes Absolute: 0.6 10*3/uL (ref 0.1–1.0)
Monocytes Relative: 6 %
Neutro Abs: 6.5 10*3/uL (ref 1.7–7.7)
Neutrophils Relative %: 66 %
Platelets: 363 10*3/uL (ref 150–400)
RBC: 4.34 MIL/uL (ref 3.87–5.11)
RDW: 12.1 % (ref 11.5–15.5)
WBC: 9.9 10*3/uL (ref 4.0–10.5)
nRBC: 0 % (ref 0.0–0.2)

## 2019-09-11 ENCOUNTER — Telehealth: Payer: Self-pay | Admitting: Oncology

## 2019-09-11 NOTE — Telephone Encounter (Signed)
No los °

## 2019-11-08 ENCOUNTER — Encounter: Payer: Self-pay | Admitting: Oncology

## 2019-11-26 ENCOUNTER — Encounter (HOSPITAL_COMMUNITY): Payer: Self-pay

## 2019-11-26 ENCOUNTER — Ambulatory Visit (HOSPITAL_COMMUNITY)
Admission: EM | Admit: 2019-11-26 | Discharge: 2019-11-26 | Disposition: A | Discharge status: Home / Self Care | Payer: Worker's Compensation | Attending: Family Medicine | Admitting: Family Medicine

## 2019-11-26 ENCOUNTER — Other Ambulatory Visit: Payer: Self-pay

## 2019-11-26 DIAGNOSIS — S39012A Strain of muscle, fascia and tendon of lower back, initial encounter: Secondary | ICD-10-CM

## 2019-11-26 DIAGNOSIS — M62830 Muscle spasm of back: Secondary | ICD-10-CM

## 2019-11-26 DIAGNOSIS — Z042 Encounter for examination and observation following work accident: Secondary | ICD-10-CM

## 2019-11-26 MED ORDER — CYCLOBENZAPRINE HCL 10 MG PO TABS: ORAL_TABLET | ORAL | 0 refills | Status: DC

## 2019-11-26 MED ORDER — DICLOFENAC SODIUM 75 MG PO TBEC: 75.0000 mg | DELAYED_RELEASE_TABLET | Freq: Two times a day (BID) | ORAL | 0 refills | Status: DC

## 2019-11-26 NOTE — ED Provider Notes (Signed)
[place]   [id number] 11/26/19 Arrival Time: 7902  ASSESSMENT & PLAN:  1. Back strain, initial encounter   2. Muscle spasm of back     Able to ambulate here and hemodynamically stable. No indication for imaging of back at this time given no trauma and normal neurological exam. Discussed.  Begin trial of: Meds ordered this encounter  Medications  . cyclobenzaprine (FLEXERIL) 10 MG tablet    Sig: Take 1 tablet by mouth 3 times daily as needed for muscle spasm. Warning: May cause drowsiness.    Dispense:  21 tablet    Refill:  0  . diclofenac (VOLTAREN) 75 MG EC tablet    Sig: Take 1 tablet (75 mg total) by mouth 2 (two) times daily.    Dispense:  14 tablet    Refill:  0    Work note with restrictions provided. Medication sedation precautions given. Encourage ROM/movement as tolerated.  Recommend: Follow-up Information    Schedule an appointment as soon as possible for a visit  with [facility].   Why: [address] [place], [place] [address]  [phone]          Reviewed expectations re: course of current medical issues. Questions answered. Outlined signs and symptoms indicating need for more acute intervention. Patient verbalized understanding. After Visit Summary given.   SUBJECTIVE: History from: patient.  Tamara [name] is a 48 y.o. female who presents with complaint of persistent right sided mid back discomfort. Onset abrupt. First noted approx 12 hours ago. Injury/trama: reports "feeling a pop in my back when I tried to move a patient by pulling his sheet". History of back problems requiring medical care: none reported. Pain described as aching and with occasional sharp pains and without radiation. Aggravating factors: certain movements, prolonged walking/standing and prolonged sitting. Alleviating factors: have not been identified. Progressive LE weakness or saddle anesthesia: none. Extremity sensation changes or  weakness: none. Ambulatory without difficulty. Normal bowel/bladder habits: yes; without urinary retention. Normal PO intake without n/v. No associated abdominal pain/n/v. Self treatment: has has not tried OTC therapies.  Reports no chronic steroid use, fevers, IV drug use, or recent back surgeries or procedures.  ROS: As per HPI. All other systems negative.   OBJECTIVE:  Vitals:   11/26/19 1318  BP: (!) 144/88  Pulse: 94  Resp: 18  Temp: 98.2 F (36.8 C)  TempSrc: Oral  SpO2: 96%    General appearance: alert; no distress HEENT: [place]; AT Neck: supple with FROM; without midline tenderness CV: RRR Lungs: unlabored respirations; symmetrical air entry Abdomen: soft, non-tender; non-distended Back: moderate and poorly localized tenderness to palpation over R thoracic paraspinal musculature; FROM at waist; bruising: none; without midline tenderness Extremities: without edema; symmetrical without gross deformities; normal ROM of bilateral LE Skin: warm and dry Neurologic: normal gait; normal reflexes of bilateral LE; normal sensation of bilateral LE; normal strength of bilateral LE Psychological: alert and cooperative; normal mood and affect   Allergies  Allergen Reactions  . Compazine [Prochlorperazine Edisylate] Other (See Comments)    Stuttering    Past Medical History:  Diagnosis Date  . Anemia   . BRCA1 positive    c.190T>G (p.Cys64Gly) @ [facility]   . Breast cancer ([facility]) 02/01/14   ER-/PR-/Her2+, still receiving chemo 12/21/14, last Herceptin 02-02-15.Left breat cancer.  . Chest pain    pt states is secondary to bilateral mastectomy  . Depression   . DVT (deep venous thrombosis) ([facility])    Right Subclavian  and IJ.. Lovenox stopped 01-10-15 until after surgery planned 02-08-15.  Marland Kitchen Dysrhythmia   . Endometriosis   . Fibroid tumor    02-02-15 remains with abdominal pain and some vaginal bleeding due to fibroids.  Marland Kitchen GERD (gastroesophageal reflux disease)   . History of blood  transfusion    last 9'15. due to chemotherapy.  Marland Kitchen History of chemotherapy   . History of staph infection   . Hypertension   . Iron deficiency anemia, unspecified 02/25/2014  . PONV (postoperative nausea and vomiting)    needs scop patch  . Rash 02/22/2015  . Shortness of breath     when Hemoglobin low, now resolved after transfusions 9'15.; increased exertion as stated per pt during PAT visit 11/08/2016  . Wears glasses    Social History   Socioeconomic History  . Marital status: Legally Separated    Spouse name: Not on file  . Number of children: 5  . Years of education: Not on file  . Highest education level: Not on file  Occupational History  . Not on file  Tobacco Use  . Smoking status: Former Smoker    Packs/day: 0.25    Years: 12.00    Pack years: 3.00    Types: Cigarettes    Quit date: 02/18/2009    Years since quitting: 10.7  . Smokeless tobacco: Never Used  Substance and Sexual Activity  . Alcohol use: No  . Drug use: Not Currently    Frequency: 2.0 times per week    Types: Marijuana    Comment: last use 11/07/2016  . Sexual activity: Not Currently    Birth control/protection: Surgical  Other Topics Concern  . Not on file  Social History Narrative  . Not on file   Social Determinants of Health   Financial Resource Strain:   . Difficulty of Paying Living Expenses: Not on file  Food Insecurity:   . Worried About Charity fundraiser in the Last Year: Not on file  . Ran Out of Food in the Last Year: Not on file  Transportation Needs:   . Lack of Transportation (Medical): Not on file  . Lack of Transportation (Non-Medical): Not on file  Physical Activity:   . Days of Exercise per Week: Not on file  . Minutes of Exercise per Session: Not on file  Stress:   . Feeling of Stress : Not on file  Social Connections:   . Frequency of Communication with Friends and Family: Not on file  . Frequency of Social Gatherings with Friends and Family: Not on file  .  Attends Religious Services: Not on file  . Active Member of Clubs or Organizations: Not on file  . Attends Archivist Meetings: Not on file  . Marital Status: Not on file  Intimate Partner Violence:   . Fear of Current or Ex-Partner: Not on file  . Emotionally Abused: Not on file  . Physically Abused: Not on file  . Sexually Abused: Not on file   Family History  Problem Relation Age of Onset  . Breast cancer Mother 14       currently 42  . Diabetes Father   . Pancreatic cancer Father 72  . Breast cancer Paternal Aunt 29       currently 8; BRCA1 positive  . Stroke Maternal Grandfather   . Cancer Paternal Aunt        unk. primary; deceased 23s  . Breast cancer Cousin        daughter  of unaffected paternal aunt; dx in her 76s   Past Surgical History:  Procedure Laterality Date  . BILATERAL TOTAL MASTECTOMY WITH AXILLARY LYMPH NODE DISSECTION Bilateral 07/28/2014   Procedure: BILATERAL TOTAL MASTECTOMY WITH LEFT AXILLARY SENTINEL LYMPH NODE BIOPSY;  Surgeon: Stark Klein, MD;  Location: [facility];  Service: General;  Laterality: Bilateral;  . BREAST RECONSTRUCTION Bilateral 03/31/2015   Procedure: BILATERAL BREAST RECONSTRUCTION REVISION WITH BILATERAL LIPOFILLING;  Surgeon: Theodoro Kos, DO;  Location: [facility];  Service: Plastics;  Laterality: Bilateral;  . BREAST RECONSTRUCTION WITH PLACEMENT OF TISSUE EXPANDER AND FLEX HD (ACELLULAR HYDRATED DERMIS) Bilateral 07/28/2014   Procedure: BILATERAL BREAST RECONSTRUCTION WITH PLACEMENT OF TISSUE EXPANDER AND FLEX HD (ACELLULAR HYDRATED DERMIS);  Surgeon: Theodoro Kos, DO;  Location: [facility];  Service: Plastics;  Laterality: Bilateral;  . CARDIAC CATHETERIZATION     10'15- Dr. Sung Amabile  . CERVICAL POLYPECTOMY  2010  . CESAREAN SECTION     one previous  . LEFT HEART CATHETERIZATION WITH CORONARY ANGIOGRAM N/A 09/13/2014   Procedure: LEFT HEART CATHETERIZATION WITH CORONARY ANGIOGRAM;  Surgeon: Jolaine Artist,  MD;  Location: [facility] CATH [facility];  Service: Cardiovascular;  Laterality: N/A;  . LIPOSUCTION WITH LIPOFILLING Bilateral 03/31/2015   Procedure: LIPOSUCTION WITH LIPOFILLING;  Surgeon: Theodoro Kos, DO;  Location: [facility];  Service: Plastics;  Laterality: Bilateral;  . MASTECTOMY    . MULTIPLE EXTRACTIONS WITH ALVEOLOPLASTY N/A 11/09/2016   Procedure: Extraction of tooth #'s 1,2,18,30, and 32 with alveoloplasty and gross debridement of remaining teeth.;  Surgeon: [name], DDS;  Location: [facility];  Service: Oral Surgery;  Laterality: N/A;  . PORT-A-CATH REMOVAL N/A 05/21/2014   Procedure: REMOVAL PORT-A-CATH;  Surgeon: Zenovia Jarred, MD;  Location: [facility];  Service: General;  Laterality: N/A;  . PORTACATH PLACEMENT Right 02/23/2014   Procedure: INSERTION PORT-A-CATH;  Surgeon: Joyice Faster. Cornett, MD;  Location: [facility];  Service: General;  Laterality: Right;  . REMOVAL OF TISSUE EXPANDER AND PLACEMENT OF IMPLANT Bilateral 10/14/2014   Procedure: REMOVAL OF BILATERAL BREAST  TISSUE EXPANDER  WITH PLASCEMENT OF BILATERAL  BREAST IMPLANTS;  Surgeon: Theodoro Kos, DO;  Location: [facility];  Service: Plastics;  Laterality: Bilateral;  . ROBOTIC ASSISTED TOTAL HYSTERECTOMY WITH BILATERAL SALPINGO OOPHERECTOMY Bilateral 02/08/2015   Procedure: ROBOTIC ASSISTED TOTAL HYSTERECTOMY WITH BILATERAL SALPINGO OOPHORECTOMY; UTERUS WEIGHING GREATER THAN 250 GRAMS;  Surgeon: Everitt Amber, MD;  Location: [facility];  Service: Gynecology;  Laterality: Bilateral;  BRCA 1 GENE MUTATION  . TEE WITHOUT CARDIOVERSION N/A 05/26/2014   Procedure: TRANSESOPHAGEAL ECHOCARDIOGRAM (TEE);  Surgeon: Larey Dresser, MD;  Location: [facility];  Service: Cardiovascular;  Laterality: N/A;  . TUBAL Ezequiel Essex, MD 11/26/19 1428

## 2019-11-26 NOTE — Discharge Instructions (Signed)
Be aware, the muscle relaxing medication prescribed may cause drowsiness. Please do not drive, operate heavy machinery or make important decisions while on this medication, it can cloud your judgement.

## 2019-11-26 NOTE — ED Triage Notes (Signed)
Pt presents to the UC with pain around the right shoulder blade x 12 hrs aprox. Pt states she was trying to moved a pt in the bed and she felt her back pop.

## 2019-12-14 ENCOUNTER — Encounter: Payer: Self-pay | Admitting: Oncology

## 2019-12-14 ENCOUNTER — Telehealth: Payer: Self-pay | Admitting: Oncology

## 2019-12-14 NOTE — Telephone Encounter (Signed)
Scheduled appt per 1/11 sch message - unable to reach pt . Left message with appt date and time

## 2019-12-22 ENCOUNTER — Encounter: Payer: Self-pay | Admitting: Oncology

## 2019-12-22 NOTE — Progress Notes (Signed)
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 She was being seen today for evaluation of this.   REVIEW OF SYSTEMS: [name]    BREAST CANCER HISTORY:   From Dr Dana Allan original intake note:   "Patient found a left breast mass in the upper outer quadrant, evaluated with mammogram at [facility] 01-18-14 with a 3.2 cm mass in the 2:00 position 7 cm from the nipple. There was also an 8 mm mass 8 cm from the nipple in the ipsilateral breast, as well as positive lymph nodes. 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 MRI confirmed 2.8 and 1.3 cm mass is as well as lymph node. On the right a 6 mm nodule, biopsied and negative for malignancy. PET scan 3-41-96 had hypermetabolic left breast mass consistent with known neoplasm and FDG positive left axillary lymph nodes,benign-appearing brown fat activity and muscular activity in the  neck and chest but no findings for metastatic disease involving the neck, chest, abdomen, pelvis or bones. Moderate FDG activity in the endometrial canal is thought likely due to secretory phase of ovulation or menses. No mass, uterine fibroids present. CT [facility] 02-19-14 had 3 cm left breast mass, enlarged left axillary lymph nodes are positive and no CT findings for metastatic disease  involving the chest,abdomen or pelvis and no evidence of osseous metastatic disease. Mildly enlarged fibroid uterus."   Her subsequent treatment is as detailed below   PAST MEDICAL HISTORY: Past Medical History:  Diagnosis Date  . Anemia   . BRCA1 positive    c.190T>G (p.Cys64Gly) @ [facility]   . Breast cancer ([place]) 02/01/14   ER-/PR-/Her2+, still receiving chemo 12/21/14, last Herceptin 02-02-15.Left breat cancer.  . Chest pain    pt states is secondary to bilateral mastectomy  . Depression   . DVT (deep venous thrombosis) ([facility])    Right Subclavian and IJ.. Lovenox stopped 01-10-15 until after surgery planned 02-08-15.  Marland Kitchen Dysrhythmia   . Endometriosis   . Fibroid tumor    02-02-15 remains with abdominal pain and some vaginal bleeding due to fibroids.  Marland Kitchen GERD (gastroesophageal reflux disease)   . History of blood transfusion    last 9'15. due to chemotherapy.  Marland Kitchen History of chemotherapy   . History of staph infection   . Hypertension   . Iron deficiency anemia, unspecified 02/25/2014  . PONV (postoperative nausea and vomiting)    needs scop patch  . Rash 02/22/2015  . Shortness of breath     when Hemoglobin low, now resolved after transfusions 9'15.; increased exertion as stated per pt during PAT visit 11/08/2016  . Wears glasses     PAST SURGICAL HISTORY: Past Surgical History:  Procedure Laterality Date  . BILATERAL TOTAL MASTECTOMY WITH AXILLARY LYMPH NODE DISSECTION Bilateral 07/28/2014   Procedure: BILATERAL TOTAL MASTECTOMY WITH LEFT AXILLARY SENTINEL LYMPH NODE BIOPSY;  Surgeon: Stark Klein, MD;  Location: [facility]  OR;  Service: General;  Laterality: Bilateral;  . BREAST RECONSTRUCTION Bilateral 03/31/2015   Procedure: BILATERAL BREAST RECONSTRUCTION REVISION WITH BILATERAL LIPOFILLING;  Surgeon: Theodoro Kos, DO;  Location: [place];  Service: Plastics;  Laterality: Bilateral;  . BREAST RECONSTRUCTION WITH PLACEMENT OF TISSUE EXPANDER AND FLEX HD (ACELLULAR HYDRATED  DERMIS) Bilateral 07/28/2014   Procedure: BILATERAL BREAST RECONSTRUCTION WITH PLACEMENT OF TISSUE EXPANDER AND FLEX HD (ACELLULAR HYDRATED DERMIS);  Surgeon: Theodoro Kos, DO;  Location: [place];  Service: Plastics;  Laterality: Bilateral;  . CARDIAC CATHETERIZATION     10'15- Dr. Sung Amabile  . CERVICAL POLYPECTOMY  2010  . CESAREAN SECTION     one previous  . LEFT HEART CATHETERIZATION WITH CORONARY ANGIOGRAM N/A 09/13/2014   Procedure: LEFT HEART CATHETERIZATION WITH CORONARY ANGIOGRAM;  Surgeon: Jolaine Artist, MD;  Location: [facility] CATH LAB;  Service: Cardiovascular;  Laterality: N/A;  . LIPOSUCTION WITH LIPOFILLING Bilateral 03/31/2015   Procedure: LIPOSUCTION WITH LIPOFILLING;  Surgeon: Theodoro Kos, DO;  Location: [place];  Service: Plastics;  Laterality: Bilateral;  . MASTECTOMY    . MULTIPLE EXTRACTIONS WITH ALVEOLOPLASTY N/A 11/09/2016   Procedure: Extraction of tooth #'s 1,2,18,30, and 32 with alveoloplasty and gross debridement of remaining teeth.;  Surgeon: [name], DDS;  Location: [facility];  Service: Oral Surgery;  Laterality: N/A;  . PORT-A-CATH REMOVAL N/A 05/21/2014   Procedure: REMOVAL PORT-A-CATH;  Surgeon: Zenovia Jarred, MD;  Location: [place];  Service: General;  Laterality: N/A;  . PORTACATH PLACEMENT Right 02/23/2014   Procedure: INSERTION PORT-A-CATH;  Surgeon: Joyice Faster. Cornett, MD;  Location: [place];  Service: General;  Laterality: Right;  . REMOVAL OF TISSUE EXPANDER AND PLACEMENT OF IMPLANT Bilateral 10/14/2014   Procedure: REMOVAL OF BILATERAL BREAST  TISSUE EXPANDER  WITH PLASCEMENT OF BILATERAL  BREAST IMPLANTS;  Surgeon: Theodoro Kos, DO;  Location: [place];  Service: Plastics;  Laterality: Bilateral;  . ROBOTIC ASSISTED TOTAL HYSTERECTOMY WITH BILATERAL SALPINGO OOPHERECTOMY Bilateral 02/08/2015   Procedure: ROBOTIC ASSISTED TOTAL HYSTERECTOMY WITH BILATERAL SALPINGO OOPHORECTOMY; UTERUS WEIGHING GREATER  THAN 250 GRAMS;  Surgeon: Everitt Amber, MD;  Location: [facility];  Service: Gynecology;  Laterality: Bilateral;  BRCA 1 GENE MUTATION  . TEE WITHOUT CARDIOVERSION N/A 05/26/2014   Procedure: TRANSESOPHAGEAL ECHOCARDIOGRAM (TEE);  Surgeon: Larey Dresser, MD;  Location: [place];  Service: Cardiovascular;  Laterality: N/A;  . TUBAL LIGATION      FAMILY HISTORY Family History  Problem Relation Age of Onset  . Breast cancer Mother 59       currently 91  . Diabetes Father   . Pancreatic cancer Father 12  . Breast cancer Paternal Aunt 79       currently 68; BRCA1 positive  . Stroke Maternal Grandfather   . Cancer Paternal Aunt        unk. primary; deceased 61s  . Breast cancer Cousin        daughter of unaffected paternal aunt; dx in her 59s  [name] father died from prostate cancer the age of 43. [name] mother was diagnosed with breast cancer the age of 61. [name] father had 5 sisters, 3 of whom were diagnosed with breast cancer, 2 of them before the age of 54. The patient had one brother, no sisters. There is no history of ovarian cancer in the family.   GYNECOLOGIC HISTORY:  Menarche age 12, first live birth age 75, the patient is [place] P5. Her periods  stopped at the time of chemotherapy. 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 Her (former) husband [name] works as an Animal nutritionist. Their divorce was finalized September 2016. [name] oldest child, a son, [name], is studying Therapist, occupational; the patient is a 10 year old daughter [name] is also in college. [name] younger children are 9, 61, and 10. The patient attends a [employer]    ADVANCED DIRECTIVES: Not in place; at the 12/15/2015 visit the patient was given the appropriate forms to complete and notarize at her discretion   HEALTH MAINTENANCE: Social History   Tobacco Use   . Smoking status: Former Smoker    Packs/day: 0.25    Years: 12.00    Pack years: 3.00    Types: Cigarettes    Quit date: 02/18/2009    Years since quitting: 10.8  . Smokeless tobacco: Never Used  Substance Use Topics  . Alcohol use: No  . Drug use: Not Currently    Frequency: 2.0 times per week    Types: Marijuana    Comment: last use 11/07/2016    Allergies  Allergen Reactions  . Compazine [Prochlorperazine Edisylate] Other (See Comments)    Stuttering    Current Outpatient Medications  Medication Sig Dispense Refill  . Biotin 5000 MCG TABS Take 10,000 mcg by mouth daily. Reported on 06/21/2016    . cyclobenzaprine (FLEXERIL) 10 MG tablet Take 1 tablet by mouth 3 times daily as needed for muscle spasm. Warning: May cause drowsiness. 21 tablet 0  . diclofenac (VOLTAREN) 75 MG EC tablet Take 1 tablet (75 mg total) by mouth 2 (two) times daily. 14 tablet 0  . lidocaine (XYLOCAINE) 5 % ointment Apply 1 application topically daily as needed. (Patient not taking: Reported on 12/09/2017) 50 g 2  . methocarbamol (ROBAXIN) 500 MG tablet Take 1 tablet (500 mg total) by mouth 3 (three) times daily. (Patient not taking: Reported on 06/22/2019) 21 tablet 0  . pantoprazole (PROTONIX) 40 MG tablet Take 40 mg by mouth daily as needed.    Marland Kitchen RASPBERRY PO Take by mouth.    . Turmeric (QC TUMERIC COMPLEX PO) Take by mouth.     No current facility-administered medications for this visit.    OBJECTIVE: Middle-aged African-American woman   There were no vitals filed for this visit.   There is no height or weight on file to calculate BMI.       LAB RESULTS:  CMP     Component Value Date/Time   NA 141 09/10/2019 1404   NA 141 06/17/2017 1037   K 3.9 09/10/2019 1404   K 4.0 06/17/2017 1037   CL 108 09/10/2019 1404   CO2 25 09/10/2019 1404   CO2 26 06/17/2017 1037   GLUCOSE 95 09/10/2019 1404   GLUCOSE 90 06/17/2017 1037   BUN 20 09/10/2019 1404   BUN 12.8 06/17/2017 1037   CREATININE  0.97 09/10/2019 1404   CREATININE 1.0 06/17/2017 1037   CALCIUM 9.0 09/10/2019 1404   CALCIUM 10.0 06/17/2017 1037   PROT 6.9 09/10/2019 1404   PROT 7.2 06/17/2017 1037   ALBUMIN 3.7 09/10/2019 1404   ALBUMIN 4.1 06/17/2017 1037   AST 12 (L) 09/10/2019 1404   AST 16 06/17/2017 1037   ALT 13 09/10/2019 1404   ALT 17 06/17/2017 1037   ALKPHOS 90 09/10/2019 1404   ALKPHOS 89 06/17/2017 1037   BILITOT 0.3  09/10/2019 1404   BILITOT 0.36 06/17/2017 1037   GFRNONAA >60 09/10/2019 1404   GFRAA >60 09/10/2019 1404    I No results found for: SPEP  Lab Results  Component Value Date   WBC 9.9 09/10/2019   NEUTROABS 6.5 09/10/2019   HGB 12.4 09/10/2019   HCT 38.4 09/10/2019   MCV 88.5 09/10/2019   PLT 363 09/10/2019        Chemistry      Component Value Date/Time   NA 141 09/10/2019 1404   NA 141 06/17/2017 1037   K 3.9 09/10/2019 1404   K 4.0 06/17/2017 1037   CL 108 09/10/2019 1404   CO2 25 09/10/2019 1404   CO2 26 06/17/2017 1037   BUN 20 09/10/2019 1404   BUN 12.8 06/17/2017 1037   CREATININE 0.97 09/10/2019 1404   CREATININE 1.0 06/17/2017 1037      Component Value Date/Time   CALCIUM 9.0 09/10/2019 1404   CALCIUM 10.0 06/17/2017 1037   ALKPHOS 90 09/10/2019 1404   ALKPHOS 89 06/17/2017 1037   AST 12 (L) 09/10/2019 1404   AST 16 06/17/2017 1037   ALT 13 09/10/2019 1404   ALT 17 06/17/2017 1037   BILITOT 0.3 09/10/2019 1404   BILITOT 0.36 06/17/2017 1037       No results found for: LABCA2  No components found for: LABCA125  No results for input(s): INR in the last 168 hours.  Urinalysis    Component Value Date/Time   COLORURINE YELLOW 08/23/2018 1626   APPEARANCEUR CLEAR 08/23/2018 1626   LABSPEC 1.026 08/23/2018 1626   LABSPEC 1.025 02/16/2015 1243   PHURINE 7.0 08/23/2018 1626   GLUCOSEU NEGATIVE 08/23/2018 1626   GLUCOSEU Negative 02/16/2015 1243   HGBUR NEGATIVE 08/23/2018 1626   BILIRUBINUR NEGATIVE 08/23/2018 1626   BILIRUBINUR negative  06/21/2016 1120   BILIRUBINUR Negative 02/16/2015 1243   KETONESUR 5 (A) 08/23/2018 1626   PROTEINUR NEGATIVE 08/23/2018 1626   UROBILINOGEN 0.2 06/21/2016 1120   UROBILINOGEN 0.2 02/16/2015 1243   NITRITE NEGATIVE 08/23/2018 1626   LEUKOCYTESUR TRACE (A) 08/23/2018 1626   LEUKOCYTESUR Moderate 02/16/2015 1243    STUDIES: No results found.   Assessment: 49 y.o. BRCA-1 positive [place] woman  1. S/p left breast upper outer quadrant biopsy of two separate breast masses and one axillary lymph node 01/29/2014 for a , clinical mT2 N1 stage IIB, invasive ductal carcinoma, grade 2-3,  estrogen and progesterone receptor negative, with an MIB-1 between 79-100%, and HER 2 amplified  2. completed 6 cycles of carboplatin, docetaxel, trastuzumab and pertuzumab 06/30/2014 with MRI 07/07/2014 showing a complete radiologic response  3. trastuzumab was to be continued to complete a year (through March 2016); however echocardiogram 07/05/2014 showed an ejection fraction of 45-50%-- trastuzumab was held after 06/30/2014 dose until EF recovery, resumed 11/09/2014  (a) cath report from 09/13/2014 shows normal coronaries and a normal left ventricular function with an ejection fraction of 55%.  (b) echo 03/01/2015 suggest continuing mild cardiomyopathy: Trastuzumab discontinued--final dose 02/22/2015  (c) echocardiogram 01/10/2016 shows an ejection fraction of 45-50%  4. 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   5. MRSA port infection and septicemia mid-June 2015;  Port was removed, and  PICC line placed, completed antibiotic therapy with ANCEF; PICC subsequently pulled   6 genetic testing with the [facility] panel and was found to have a pathogenic mutation in the BRCA1 gene called c.190T>G (p.Cys64Gly  7. status post bilateral mastectomies and left  axillary sentinel lymph node biopsy, withimmediate expander placement, on 07/28/14; the pathology (SZA  [id number]) showed a complete pathologic response in the left breast and the 3 sentinel lymph nodes sampled; the right breast was benign  (a) definitive implant placement bilaterally 10/14/2014   Left - Mentor memory shape medium height high profile 390cc. Ref #[id number].  Serial Number [id number]   Right - Mentor memory shape medium height high profile 390cc. Ref #[id number].  Serial Number [id number]  (b) bilateral breast reconstruction revision 03/31/2015  8. Bilateral salpingo-oophorectomy and hysterectomy 02/08/15 with benign pathology   PLAN:  Patient did not show for her 12/23/2019 appointment   Keyen Marban, Virgie Dad, MD  12/23/19 10:49 AM Medical Oncology and Hematology [facility] [place], [place] [address] Tel. [phone]    Fax. [phone]   I, Wilburn Mylar, am acting as scribe for Dr. Virgie Dad. Shermon Bozzi.  I, Lurline Del MD, have reviewed the above documentation for accuracy and completeness, and I agree with the above.   *Total Encounter Time as defined by the Centers for Medicare and Medicaid Services includes, in addition to the face-to-face time of a patient visit (documented in the note above) non-face-to-face time: obtaining and reviewing outside history, ordering and reviewing medications, tests or procedures, care coordination (communications with other health care professionals or caregivers) and documentation in the medical record.

## 2019-12-23 ENCOUNTER — Inpatient Hospital Stay: Payer: Medicaid Other | Attending: Oncology | Admitting: Oncology

## 2019-12-23 ENCOUNTER — Encounter: Payer: Self-pay | Admitting: Oncology

## 2021-02-10 ENCOUNTER — Emergency Department (HOSPITAL_COMMUNITY): Payer: Self-pay

## 2021-02-10 ENCOUNTER — Emergency Department (HOSPITAL_COMMUNITY)
Admission: EM | Admit: 2021-02-10 | Discharge: 2021-02-10 | Disposition: A | Discharge status: Home / Self Care | Payer: Self-pay | Attending: Emergency Medicine | Admitting: Emergency Medicine

## 2021-02-10 ENCOUNTER — Telehealth (HOSPITAL_COMMUNITY): Payer: Self-pay | Admitting: Emergency Medicine

## 2021-02-10 ENCOUNTER — Encounter (HOSPITAL_COMMUNITY): Payer: Self-pay

## 2021-02-10 ENCOUNTER — Other Ambulatory Visit: Payer: Self-pay

## 2021-02-10 DIAGNOSIS — I10 Essential (primary) hypertension: Secondary | ICD-10-CM | POA: Insufficient documentation

## 2021-02-10 DIAGNOSIS — Z9861 Coronary angioplasty status: Secondary | ICD-10-CM | POA: Insufficient documentation

## 2021-02-10 DIAGNOSIS — S52502A Unspecified fracture of the lower end of left radius, initial encounter for closed fracture: Secondary | ICD-10-CM | POA: Insufficient documentation

## 2021-02-10 DIAGNOSIS — W010XXA Fall on same level from slipping, tripping and stumbling without subsequent striking against object, initial encounter: Secondary | ICD-10-CM | POA: Insufficient documentation

## 2021-02-10 DIAGNOSIS — Z87891 Personal history of nicotine dependence: Secondary | ICD-10-CM | POA: Insufficient documentation

## 2021-02-10 DIAGNOSIS — Z853 Personal history of malignant neoplasm of breast: Secondary | ICD-10-CM | POA: Insufficient documentation

## 2021-02-10 MED ORDER — HYDROCODONE-ACETAMINOPHEN 10-325 MG PO TABS: 0.5000 | ORAL_TABLET | Freq: Four times a day (QID) | ORAL | 0 refills | Status: DC | PRN

## 2021-02-10 MED ORDER — HYDROCODONE-ACETAMINOPHEN 5-325 MG PO TABS: 1.0000 | ORAL_TABLET | Freq: Four times a day (QID) | ORAL | 0 refills | Status: DC | PRN

## 2021-02-10 NOTE — ED Provider Notes (Signed)
[facility] DEPT Provider Note   CSN: [id number] Arrival date & time: 02/10/21  0522     History Chief Complaint  Patient presents with  . Wrist Pain    Tamara [name] is a 50 y.o. [name].  Patient with history of breast cancer and previous DVT not on anticoagulation presenting with left wrist pain after falling yesterday.  States she tripped while playing with her grandchild and fell backwards on her outstretched left wrist.  Has pain and swelling to her left wrist that radiates down her forearm.  Took ibuprofen at home without relief.  Reduced range of motion of wrist and fingers.  No numbness or tingling.  Denies any head injury.  No neck or back pain.  No chest pain or abdominal pain.  No difficulty breathing.  No blood thinner use at home  The history is provided by the patient.  Wrist Pain Pertinent negatives include no chest pain, no abdominal pain, no headaches and no shortness of breath.       Past Medical History:  Diagnosis Date  . Anemia   . BRCA1 positive    c.190T>G (p.Cys64Gly) @ [facility]   . Breast cancer ([place]) 02/01/14   ER-/PR-/Her2+, still receiving chemo 12/21/14, last Herceptin 02-02-15.Left breat cancer.  . Chest pain    pt states is secondary to bilateral mastectomy  . Depression   . DVT (deep venous thrombosis) ([facility])    Right Subclavian and IJ.. Lovenox stopped 01-10-15 until after surgery planned 02-08-15.  Marland Kitchen Dysrhythmia   . Endometriosis   . Fibroid tumor    02-02-15 remains with abdominal pain and some vaginal bleeding due to fibroids.  Marland Kitchen GERD (gastroesophageal reflux disease)   . History of blood transfusion    last 9'15. due to chemotherapy.  Marland Kitchen History of chemotherapy   . History of staph infection   . Hypertension   . Iron deficiency anemia, unspecified 02/25/2014  . PONV (postoperative nausea and vomiting)    needs scop patch  . Rash 02/22/2015  . Shortness of breath     when Hemoglobin low, now resolved after  transfusions 9'15.; increased exertion as stated per pt during PAT visit 11/08/2016  . Wears glasses     Patient Active Problem List   Diagnosis Date Noted  . Rib pain 08/07/2017  . Chronic periodontitis 11/05/2016  . Adjustment disorder with mixed anxiety and depressed mood 09/20/2016  . Left hip pain 07/19/2016  . Urination frequency 06/21/2016  . Low back pain, non-specific 06/21/2016  . Acute deep vein thrombosis (DVT) of proximal end of right lower extremity ([place]) 12/15/2015  . Breast cancer, BRCA1 positive ([place]) 12/15/2015  . Genetic testing 10/10/2015  . Chest pain 08/28/2015  . Precordial chest pain 08/18/2015  . 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 Rash and nonspecific skin eruption 02/22/2015  . Long term current use of anticoagulant therapy 02/22/2015  . 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 02/08/2015  . Heavy menstrual bleeding 01/11/2015  . New daily persistent headache 12/16/2014  . Essential hypertension, benign 12/16/2014  . Moderate recurrent major depression ([place]) 12/16/2014  . Cardiomyopathy ([place]) 10/20/2014  . Cardiomyopathy, ischemic 09/06/2014  . Fever 09/02/2014  . Neoplasm related pain 09/02/2014  . Anemia 08/07/2014  . Symptomatic anemia 08/07/2014  . Absence of breast 08/06/2014  . Bloodstream infection due to Port-A-Cath 05/30/2014  . Acute blood loss anemia 05/20/2014  . Staphylococcus aureus bacteremia with sepsis ([place]) 05/19/2014  . Sinus tachycardia 05/19/2014  . AKI (acute kidney injury) ([place])  05/19/2014  . Palpitations 05/05/2014  . BRCA1 positive   . Local infection due to Port-A-Cath 03/09/2014  . Iron deficiency anemia 02/25/2014  . HTN (hypertension) 02/19/2014  . 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 BILATERAL TOTAL MASTECTOMY WITH AXILLARY LYMPH NODE DISSECTION Bilateral 07/28/2014   Procedure:  BILATERAL TOTAL MASTECTOMY WITH LEFT AXILLARY SENTINEL LYMPH NODE BIOPSY;  Surgeon: Stark Klein, MD;  Location: [facility];  Service: General;  Laterality: Bilateral;  . BREAST RECONSTRUCTION Bilateral 03/31/2015   Procedure: BILATERAL BREAST RECONSTRUCTION REVISION WITH BILATERAL LIPOFILLING;  Surgeon: Theodoro Kos, DO;  Location: [facility];  Service: Plastics;  Laterality: Bilateral;  . BREAST RECONSTRUCTION WITH PLACEMENT OF TISSUE EXPANDER AND FLEX HD (ACELLULAR HYDRATED DERMIS) Bilateral 07/28/2014   Procedure: BILATERAL BREAST RECONSTRUCTION WITH PLACEMENT OF TISSUE EXPANDER AND FLEX HD (ACELLULAR HYDRATED DERMIS);  Surgeon: Theodoro Kos, DO;  Location: [facility];  Service: Plastics;  Laterality: Bilateral;  . CARDIAC CATHETERIZATION     10'15- Dr. Sung Amabile  . CERVICAL POLYPECTOMY  2010  . CESAREAN SECTION     one previous  . LEFT HEART CATHETERIZATION WITH CORONARY ANGIOGRAM N/A 09/13/2014   Procedure: LEFT HEART CATHETERIZATION WITH CORONARY ANGIOGRAM;  Surgeon: Jolaine Artist, MD;  Location: [facility] CATH [facility];  Service: Cardiovascular;  Laterality: N/A;  . LIPOSUCTION WITH LIPOFILLING Bilateral 03/31/2015   Procedure: LIPOSUCTION WITH LIPOFILLING;  Surgeon: Theodoro Kos, DO;  Location: [facility];  Service: Plastics;  Laterality: Bilateral;  . MASTECTOMY    . MULTIPLE EXTRACTIONS WITH ALVEOLOPLASTY N/A 11/09/2016   Procedure: Extraction of tooth #'s 1,2,18,30, and 32 with alveoloplasty and gross debridement of remaining teeth.;  Surgeon: [name], DDS;  Location: [facility];  Service: Oral Surgery;  Laterality: N/A;  . PORT-A-CATH REMOVAL N/A 05/21/2014   Procedure: REMOVAL PORT-A-CATH;  Surgeon: Zenovia Jarred, MD;  Location: [facility];  Service: General;  Laterality: N/A;  . PORTACATH PLACEMENT Right 02/23/2014   Procedure: INSERTION PORT-A-CATH;  Surgeon: Joyice Faster. Cornett, MD;  Location: [facility];  Service: General;  Laterality: Right;  .  REMOVAL OF TISSUE EXPANDER AND PLACEMENT OF IMPLANT Bilateral 10/14/2014   Procedure: REMOVAL OF BILATERAL BREAST  TISSUE EXPANDER  WITH PLASCEMENT OF BILATERAL  BREAST IMPLANTS;  Surgeon: Theodoro Kos, DO;  Location: [facility];  Service: Plastics;  Laterality: Bilateral;  . ROBOTIC ASSISTED TOTAL HYSTERECTOMY WITH BILATERAL SALPINGO OOPHERECTOMY Bilateral 02/08/2015   Procedure: ROBOTIC ASSISTED TOTAL HYSTERECTOMY WITH BILATERAL SALPINGO OOPHORECTOMY; UTERUS WEIGHING GREATER THAN 250 GRAMS;  Surgeon: Everitt Amber, MD;  Location: [facility];  Service: Gynecology;  Laterality: Bilateral;  BRCA 1 GENE MUTATION  . TEE WITHOUT CARDIOVERSION N/A 05/26/2014   Procedure: TRANSESOPHAGEAL ECHOCARDIOGRAM (TEE);  Surgeon: Larey Dresser, MD;  Location: [facility];  Service: Cardiovascular;  Laterality: N/A;  . TUBAL LIGATION       OB History    Gravida  5   Para  5   Term  5   Preterm      AB      Living  5     SAB      IAB      Ectopic      Multiple      Live Births              Family History  Problem Relation Age of Onset  .  Breast cancer Mother 23       currently 35  . Diabetes Father   . Pancreatic cancer Father 58  . Breast cancer Paternal Aunt 41       currently 17; BRCA1 positive  . Stroke Maternal Grandfather   . Cancer Paternal Aunt        unk. primary; deceased 67s  . Breast cancer Cousin        daughter of unaffected paternal aunt; dx in her 79s    Social History   Tobacco Use  . Smoking status: Former Smoker    Packs/day: 0.25    Years: 12.00    Pack years: 3.00    Types: Cigarettes    Quit date: 02/18/2009    Years since quitting: 11.9  . Smokeless tobacco: Never Used  Vaping Use  . Vaping Use: Never used  Substance Use Topics  . Alcohol use: No  . Drug use: Not Currently    Frequency: 2.0 times per week    Types: Marijuana    Comment: last use 11/07/2016    Home Medications Prior to Admission medications   Medication Sig Start  Date End Date Taking? Authorizing Provider  Biotin 5000 MCG TABS Take 10,000 mcg by mouth daily. Reported on 06/21/2016    [provider]  cyclobenzaprine (FLEXERIL) 10 MG tablet Take 1 tablet by mouth 3 times daily as needed for muscle spasm. Warning: May cause drowsiness. 11/26/19   Vanessa Kick, MD  diclofenac (VOLTAREN) 75 MG EC tablet Take 1 tablet (75 mg total) by mouth 2 (two) times daily. 11/26/19   Vanessa Kick, MD  lidocaine (XYLOCAINE) 5 % ointment Apply 1 application topically daily as needed. Patient not taking: Reported on 12/09/2017 08/07/17   Tresa Garter, MD  methocarbamol (ROBAXIN) 500 MG tablet Take 1 tablet (500 mg total) by mouth 3 (three) times daily. Patient not taking: Reported on 06/22/2019 12/09/17   Harle Stanford., PA-C  pantoprazole (PROTONIX) 40 MG tablet Take 40 mg by mouth daily as needed.    [provider]  RASPBERRY PO Take by mouth.    [provider]  Turmeric (QC TUMERIC COMPLEX PO) Take by mouth.    [provider]    Allergies    Compazine [prochlorperazine edisylate]  Review of Systems   Review of Systems  Constitutional: Negative for activity change, appetite change and fever.  HENT: Negative for congestion and rhinorrhea.   Eyes: Negative for visual disturbance.  Respiratory: Negative for cough, chest tightness and shortness of breath.   Cardiovascular: Negative for chest pain.  Gastrointestinal: Negative for abdominal pain, nausea and vomiting.  Genitourinary: Negative for dysuria and hematuria.  Musculoskeletal: Positive for arthralgias and myalgias.  Skin: Negative for rash.  Neurological: Negative for dizziness, weakness and headaches.   all other systems are negative except as noted in the HPI and PMH.    Physical Exam Updated Vital Signs BP (!) 140/108 (BP Location: Right Arm)   Pulse 73   Temp 98.1 F (36.7 C) (Oral)   Resp 18   LMP 04/04/2014 Comment: neg preg today  SpO2 98%   Physical  Exam Vitals and nursing note reviewed.  Constitutional:      General: She is not in acute distress.    Appearance: She is well-developed. She is obese.  HENT:     Head: Normocephalic and atraumatic.     Mouth/Throat:     Pharynx: No oropharyngeal exudate.  Eyes:     Conjunctiva/sclera: Conjunctivae  normal.     Pupils: Pupils are equal, round, and reactive to light.  Neck:     Comments: No meningismus. Cardiovascular:     Rate and Rhythm: Normal rate and regular rhythm.     Heart sounds: Normal heart sounds. No murmur heard.   Pulmonary:     Effort: Pulmonary effort is normal. No respiratory distress.     Breath sounds: Normal breath sounds.  Abdominal:     Palpations: Abdomen is soft.     Tenderness: There is no abdominal tenderness. There is no guarding or rebound.  Musculoskeletal:        General: Swelling, tenderness and signs of injury present.     Cervical back: Normal range of motion and neck supple.     Comments: Left wrist is tender to palpation and swollen.  There is no obvious deformity.  Intact radial pulse.  Reduced range of motion of fingers due to pain  Skin:    General: Skin is warm.  Neurological:     Mental Status: She is alert and oriented to person, place, and time.     Cranial Nerves: No cranial nerve deficit.     Motor: No abnormal muscle tone.     Coordination: Coordination normal.     Comments:  5/5 strength throughout. CN 2-12 intact.Equal grip strength.   Psychiatric:        Behavior: Behavior normal.     ED Results / Procedures / Treatments   Labs (all labs ordered are listed, but only abnormal results are displayed) Labs Reviewed - No data to display  EKG None  Radiology DG Forearm Left  Result Date: 02/10/2021 CLINICAL DATA:  50 year old [name] status post trip and fall. Pain. EXAM: LEFT FOREARM - 2 VIEW COMPARISON:  Left wrist series. FINDINGS: Distal left radius fracture is detailed separately. The more proximal left radius appears  intact. Intact left ulna. Preserved alignment at the elbow where no joint effusion is evident. Grossly intact distal humerus. IMPRESSION: 1. Distal left radius fracture detailed separately. 2. No other acute fracture or dislocation in the left forearm. Electronically Signed   By: Genevie Ann M.D.   On: 02/10/2021 06:26   DG Wrist Complete Left  Result Date: 02/10/2021 CLINICAL DATA:  50 year old [name] status post trip and fall.  Pain. EXAM: LEFT WRIST - COMPLETE 3+ VIEW COMPARISON:  None. FINDINGS: Comminuted fracture of the distal left radius is minimally impacted, otherwise nondisplaced. DRU involvement. Radiocarpal joint involvement. Distal ulnar remains intact. Carpal bones appear intact and normally aligned. Visible metacarpals intact. Generalized wrist soft tissue swelling. IMPRESSION: Comminuted but minimally displaced fracture of the distal left radius with radiocarpal joint and DRU involvement. Electronically Signed   By: Genevie Ann M.D.   On: 02/10/2021 06:25    Procedures Procedures   Medications Ordered in ED Medications - No data to display  ED Course  I have reviewed the triage vital signs and the nursing notes.  Pertinent labs & imaging results that were available during my care of the patient were reviewed by me and considered in my medical decision making (see chart for details).    MDM Rules/Calculators/A&P                          Fall with left wrist injury.  Neurovascularly intact.  No head or neck injury.  No chest pain or difficulty breathing.  X-ray shows comminuted distal left radius fracture.  Nondisplaced.  Patient splinted, sling,  pain control, ice, elevation.  Follow-up with Dr. Jeannie Fend of hand surgery.  Return precautions discussed Final Clinical Impression(s) / ED Diagnoses Final diagnoses:  Closed fracture of distal end of left radius, unspecified fracture morphology, initial encounter    Rx / DC Orders ED Discharge Orders    None       Rancour,  Annie Main, MD 02/10/21 [phone]

## 2021-02-10 NOTE — Discharge Instructions (Signed)
Take the pain medication as prescribed, keep your arm elevated, wear the splint and follow-up with Dr. Jeannie Fend in the hand surgery office.  Do not use your left arm for work.  Return to the ED if worsening pain, numbness, tingling, any other concerns.

## 2021-02-10 NOTE — ED Triage Notes (Signed)
Pt repots tripping and catching self injuring left wrist.

## 2021-02-10 NOTE — Telephone Encounter (Signed)
Instructed by pharmacy they do not have 5/325 hydrocodone.  Requires a new prescription.  Will write for 10/325 and take 1/2 tablet.

## 2021-02-20 ENCOUNTER — Other Ambulatory Visit: Payer: Self-pay | Admitting: Orthopaedic Surgery

## 2021-02-20 DIAGNOSIS — S52502A Unspecified fracture of the lower end of left radius, initial encounter for closed fracture: Secondary | ICD-10-CM

## 2021-03-04 ENCOUNTER — Inpatient Hospital Stay: Admission: RE | Admit: 2021-03-04 | Payer: Medicaid - Dental | Source: Ambulatory Visit

## 2021-04-11 ENCOUNTER — Ambulatory Visit
Admission: RE | Admit: 2021-04-11 | Discharge: 2021-04-11 | Disposition: A | Discharge status: Home / Self Care | Payer: Self-pay | Source: Ambulatory Visit | Attending: Orthopaedic Surgery | Admitting: Orthopaedic Surgery

## 2021-04-11 DIAGNOSIS — S52502A Unspecified fracture of the lower end of left radius, initial encounter for closed fracture: Secondary | ICD-10-CM

## 2022-01-09 ENCOUNTER — Telehealth: Payer: Self-pay | Admitting: *Deleted

## 2022-01-09 NOTE — Telephone Encounter (Signed)
This RN spoke with per her contact with concern of noted changes in her right breast ( at base ) "like a rippling".  Note pt has history of left breast cancer s/p chemo and bilateral mastectomy due to her BRCA + breast cancer status.  Pt does not have a follow up per last visit in 2021- and needs continued appts.  Per review with [facility]/NP - requested for pt to be scheduled for visit to evaluate further.  Appt made with confirmation with the patient.

## 2022-01-10 ENCOUNTER — Other Ambulatory Visit: Payer: Self-pay

## 2022-01-10 ENCOUNTER — Inpatient Hospital Stay: Payer: Medicaid Other | Attending: Adult Health | Admitting: Adult Health

## 2022-01-10 VITALS — BP 162/99 | HR 76 | Temp 98.0°F | Resp 18 | Ht 65.0 in | Wt 197.4 lb

## 2022-01-10 DIAGNOSIS — Z86718 Personal history of other venous thrombosis and embolism: Secondary | ICD-10-CM | POA: Insufficient documentation

## 2022-01-10 DIAGNOSIS — Z1501 Genetic susceptibility to malignant neoplasm of breast: Secondary | ICD-10-CM | POA: Insufficient documentation

## 2022-01-10 DIAGNOSIS — Z171 Estrogen receptor negative status [ER-]: Secondary | ICD-10-CM

## 2022-01-10 DIAGNOSIS — C50412 Malignant neoplasm of upper-outer quadrant of left female breast: Secondary | ICD-10-CM

## 2022-01-10 DIAGNOSIS — Z87891 Personal history of nicotine dependence: Secondary | ICD-10-CM | POA: Insufficient documentation

## 2022-01-10 NOTE — Progress Notes (Signed)
[facility] Follow up:    Patient, No Pcp Per (Inactive) No address on file   DIAGNOSIS:  Cancer Staging  Malignant neoplasm of upper-outer quadrant of left breast in female, estrogen receptor negative ([place]) Staging form: Breast, AJCC 7th Edition - Clinical: Stage IIB (T2, N1, cM0) - Unsigned Specimen type: Core Needle Biopsy Histopathologic type: 9931 Laterality: Left Staging comments: Staged at Breast Conference 02/10/14.  - Pathologic: No stage assigned - Unsigned Specimen type: Core Needle Biopsy Histopathologic type: 9931 Laterality: Left   SUMMARY OF ONCOLOGIC HISTORY: Oncology History  Malignant neoplasm of upper-outer quadrant of left breast in female, estrogen receptor negative ([place])  01/08/2014 Mammogram   Left breast: 3 cm breast mass, 7 cm from the nipple   01/08/2014 Breast US   Left breast: irregularly marginated hypoechoic mass with increased vascularity located at 2 o'clock position, 7 cm from the nipple corresponding to the palpable and mammographic finding. This measures 3.2 x 2.6 x 2.0 cm   01/29/2014 Initial Biopsy   Left breast core needle bx: 2:00 lesion, 7 CFN: IDC, DCIS, +LVI; 2:00, 9CFM: IDC, DCIS.  Left axillary LN: positive for malignancy.  ER- (0%), PR- (0%), HER2/neu amplified (ratio 5.1), Ki67 79-100%.   02/07/2014 Breast MRI   Two enhancing masses in the upper-outer quadrant of the left breast with enlarged axillary adenopathy corresponding with the patient's known areas of invasive ductal carcinoma and DCIS as well as axillary metastasis.     02/07/2014 Clinical Stage   Stage IIB: T2 N1   02/22/2014 Procedure   [facility] panel and was found to have a pathogenic mutation in the BRCA1 gene called c.190T>G (p.Cys64Gly   02/22/2014 Procedure   Right breast core needle bx: negative for malignancy   02/25/2014 - 06/30/2014 Neo-Adjuvant Chemotherapy   Docetaxel, carboplatin, trastuzumab, and pertuzumab (3/26-7/29/15) followed by  maintenance trastuzumab   06/30/2014 -  Chemotherapy   Maintenace trastuzumab.  Held 07/2014 due to decreased EF=  resumed 11/09/2014; continued decline; d/c'd 02/22/15; resumed 08/04/15 after discussion with Dr. Haroldine Laws   07/07/2014 Breast MRI   no remaining abnormal enhancement or mass seen the left breast. No suspicious findings on either side   07/28/2014 Definitive Surgery   Bilateral mastectomy/SLNB Barry Dienes) with implant placement: no evidence of residual malignancy in left breast; negative in right breast. 3 Left axillary LN removed and no evidence of malignancy (0/3). two benign LN from right.   07/28/2014 Pathologic Stage   ypT0 ypN0   10/14/2014 Surgery   Removal of bilateral breast tissue expander with placement of bilateral breast implants ([place])    02/08/2015 Surgery   Bilateral salpingo-oophorectomy and hysterectomy with benign pathology   03/31/2015 Surgery   Breast reconstruction revision with bilateral lipofilling ([place])     CURRENT THERAPY: observation  INTERVAL HISTORY: Athea Haley 51 y.o. female returns for evaluation of a change in her breast implant.  She noted some right breast implant rippling, and change in appearance.  She does not note any abnormality.  [place] notes that she has stopped [facility] since Dr. Doreene Burke left.  She is looking for a new PCP, however she is experiencing difficulty since she lost her job and insurance recently.     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/01/2015   Rash and nonspecific skin eruption 02/22/2015   Long term current use of anticoagulant therapy 02/22/2015   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 02/08/2015   Heavy menstrual bleeding 01/11/2015   New daily persistent headache 12/16/2014   Essential hypertension, benign 12/16/2014   Moderate recurrent major depression ([place]) 12/16/2014   Cardiomyopathy ([place]) 10/20/2014   Cardiomyopathy, ischemic 09/06/2014   Fever 09/02/2014   Neoplasm related pain 09/02/2014   Anemia 08/07/2014   Symptomatic anemia 08/07/2014   Absence of breast 08/06/2014   Bloodstream infection due to Port-A-Cath 05/30/2014   Acute blood loss anemia 05/20/2014   Staphylococcus aureus bacteremia with sepsis ([place]) 05/19/2014   Sinus tachycardia 05/19/2014   AKI (acute kidney injury) ([place]) 05/19/2014   Palpitations 05/05/2014   BRCA1 positive    Local infection due to Port-A-Cath 03/09/2014   Iron deficiency anemia 02/25/2014   HTN (hypertension) 02/19/2014   Malignant neoplasm of upper-outer quadrant of left breast in female, estrogen receptor negative ([place]) 02/02/2014    is allergic to compazine [prochlorperazine edisylate].  MEDICAL HISTORY: Past Medical History:  Diagnosis Date   Anemia    BRCA1 positive    c.190T>G (p.Cys64Gly) @ [facility]    Breast cancer ([place]) 02/01/14   ER-/PR-/Her2+, still receiving chemo 12/21/14, last Herceptin 02-02-15.Left breat cancer.   Chest pain    pt states is secondary to bilateral mastectomy   Depression    DVT (deep venous thrombosis) ([facility])    Right Subclavian and IJ.. Lovenox stopped 01-10-15 until after surgery planned 02-08-15.   Dysrhythmia    Endometriosis    Fibroid tumor    02-02-15 remains with abdominal pain and some vaginal bleeding due to fibroids.   GERD (gastroesophageal reflux disease)    History of blood transfusion    last 9'15. due to chemotherapy.   History of chemotherapy    History of staph infection     Hypertension    Iron deficiency anemia, unspecified 02/25/2014   PONV (postoperative nausea and vomiting)    needs scop patch   Rash 02/22/2015   Shortness of breath     when Hemoglobin low, now resolved after transfusions 9'15.; increased exertion as stated per pt during PAT visit 11/08/2016   Wears glasses     SURGICAL HISTORY: Past Surgical History:  Procedure Laterality Date   BILATERAL TOTAL MASTECTOMY WITH AXILLARY LYMPH NODE DISSECTION Bilateral 07/28/2014   Procedure: BILATERAL TOTAL MASTECTOMY WITH LEFT AXILLARY SENTINEL LYMPH NODE BIOPSY;  Surgeon: Stark Klein, MD;  Location: [place];  Service: General;  Laterality: Bilateral;   BREAST RECONSTRUCTION Bilateral 03/31/2015   Procedure: BILATERAL BREAST RECONSTRUCTION REVISION WITH BILATERAL LIPOFILLING;  Surgeon: Theodoro Kos, DO;  Location: [place];  Service: Plastics;  Laterality: Bilateral;   BREAST RECONSTRUCTION WITH PLACEMENT OF TISSUE EXPANDER AND FLEX HD (ACELLULAR HYDRATED DERMIS) Bilateral 07/28/2014   Procedure: BILATERAL BREAST RECONSTRUCTION WITH PLACEMENT OF TISSUE EXPANDER AND FLEX HD (ACELLULAR HYDRATED DERMIS);  Surgeon: Theodoro Kos, DO;  Location: [place];  Service: Plastics;  Laterality: Bilateral;   CARDIAC CATHETERIZATION     10'15- Dr. Sung Amabile   CERVICAL POLYPECTOMY  2010   CESAREAN SECTION     one previous   LEFT HEART  CATHETERIZATION WITH CORONARY ANGIOGRAM N/A 09/13/2014   Procedure: LEFT HEART CATHETERIZATION WITH CORONARY ANGIOGRAM;  Surgeon: Jolaine Artist, MD;  Location: [facility] CATH [facility];  Service: Cardiovascular;  Laterality: N/A;   LIPOSUCTION WITH LIPOFILLING Bilateral 03/31/2015   Procedure: LIPOSUCTION WITH LIPOFILLING;  Surgeon: Theodoro Kos, DO;  Location: [place];  Service: Plastics;  Laterality: Bilateral;   MASTECTOMY     MULTIPLE EXTRACTIONS WITH ALVEOLOPLASTY N/A 11/09/2016   Procedure: Extraction of tooth #'s 1,2,18,30, and 32 with alveoloplasty and  gross debridement of remaining teeth.;  Surgeon: [name], DDS;  Location: [facility];  Service: Oral Surgery;  Laterality: N/A;   PORT-A-CATH REMOVAL N/A 05/21/2014   Procedure: REMOVAL PORT-A-CATH;  Surgeon: Zenovia Jarred, MD;  Location: [place];  Service: General;  Laterality: N/A;   PORTACATH PLACEMENT Right 02/23/2014   Procedure: INSERTION PORT-A-CATH;  Surgeon: Joyice Faster. Cornett, MD;  Location: [place];  Service: General;  Laterality: Right;   REMOVAL OF TISSUE EXPANDER AND PLACEMENT OF IMPLANT Bilateral 10/14/2014   Procedure: REMOVAL OF BILATERAL BREAST  TISSUE EXPANDER  WITH PLASCEMENT OF BILATERAL  BREAST IMPLANTS;  Surgeon: Theodoro Kos, DO;  Location: [place];  Service: Plastics;  Laterality: Bilateral;   ROBOTIC ASSISTED TOTAL HYSTERECTOMY WITH BILATERAL SALPINGO OOPHERECTOMY Bilateral 02/08/2015   Procedure: ROBOTIC ASSISTED TOTAL HYSTERECTOMY WITH BILATERAL SALPINGO OOPHORECTOMY; UTERUS WEIGHING GREATER THAN 250 GRAMS;  Surgeon: Everitt Amber, MD;  Location: [facility];  Service: Gynecology;  Laterality: Bilateral;  BRCA 1 GENE MUTATION   TEE WITHOUT CARDIOVERSION N/A 05/26/2014   Procedure: TRANSESOPHAGEAL ECHOCARDIOGRAM (TEE);  Surgeon: Larey Dresser, MD;  Location: [place];  Service: Cardiovascular;  Laterality: N/A;   TUBAL LIGATION      SOCIAL HISTORY: Social History   Socioeconomic History   Marital status: Divorced    Spouse name: Not on file   Number of children: 5   Years of education: Not on file   Highest education level: Not on file  Occupational History   Not on file  Tobacco Use   Smoking status: Former    Packs/day: 0.25    Years: 12.00    Pack years: 3.00    Types: Cigarettes    Quit date: 02/18/2009    Years since quitting: 12.9   Smokeless tobacco: Never  Vaping Use   Vaping Use: Never used  Substance and Sexual Activity   Alcohol use: No   Drug use: Not Currently    Frequency: 2.0 times per week    Types:  Marijuana    Comment: last use 11/07/2016   Sexual activity: Not Currently    Birth control/protection: Surgical  Other Topics Concern   Not on file  Social History Narrative   Not on file   Social Determinants of Health   Financial Resource Strain: Not on file  Food Insecurity: Not on file  Transportation Needs: Not on file  Physical Activity: Not on file  Stress: Not on file  Social Connections: Not on file  Intimate Partner Violence: Not on file    FAMILY HISTORY: Family History  Problem Relation Age of Onset   Breast cancer Mother 37       currently 78   Diabetes Father    Pancreatic cancer Father 78   Breast cancer Paternal Aunt 97       currently 75; BRCA1 positive   Stroke Maternal Grandfather    Cancer Paternal Aunt        unk. primary;  deceased 89s   Breast cancer Cousin        daughter of unaffected paternal aunt; dx in her 62s    Review of Systems  Constitutional:  Negative for appetite change, chills, fatigue, fever and unexpected weight change.  HENT:   Negative for hearing loss, lump/mass and trouble swallowing.   Eyes:  Negative for eye problems and icterus.  Respiratory:  Negative for chest tightness, cough and shortness of breath.   Cardiovascular:  Negative for chest pain, leg swelling and palpitations.  Gastrointestinal:  Negative for abdominal distention, abdominal pain, constipation, diarrhea, nausea and vomiting.  Endocrine: Negative for hot flashes.  Genitourinary:  Negative for difficulty urinating.   Musculoskeletal:  Negative for arthralgias.  Skin:  Negative for itching and rash.  Neurological:  Negative for dizziness, extremity weakness, headaches and numbness.  Hematological:  Negative for adenopathy. Does not bruise/bleed easily.  Psychiatric/Behavioral:  Negative for depression. The patient is not nervous/anxious.      PHYSICAL EXAMINATION  ECOG PERFORMANCE STATUS: 1 - Symptomatic but completely ambulatory  Vitals:   01/10/22 1142   BP: (!) 162/99  Pulse: 76  Resp: 18  Temp: 98 F (36.7 C)  SpO2: 100%    Physical Exam Constitutional:      General: She is not in acute distress.    Appearance: Normal appearance. She is not toxic-appearing.  HENT:     Head: Normocephalic and atraumatic.  Eyes:     General: No scleral icterus. Cardiovascular:     Rate and Rhythm: Normal rate and regular rhythm.     Pulses: Normal pulses.     Heart sounds: Normal heart sounds.  Pulmonary:     Effort: Pulmonary effort is normal.     Breath sounds: Normal breath sounds.  Chest:     Comments: S/p bilateral mastectomies with reconstruction.  Right breast implant with obvious rippling noted in lower breast.  There is no nodularity, mass, or sign of breast cancer recurrence.  Abdominal:     General: Abdomen is flat. Bowel sounds are normal. There is no distension.     Palpations: Abdomen is soft.     Tenderness: There is no abdominal tenderness.  Musculoskeletal:        General: No swelling.     Cervical back: Neck supple.  Lymphadenopathy:     Cervical: No cervical adenopathy.  Skin:    General: Skin is warm and dry.     Findings: No rash.  Neurological:     General: No focal deficit present.     Mental Status: She is alert.  Psychiatric:        Mood and Affect: Mood normal.        Behavior: Behavior normal.    LABORATORY DATA:  CBC    Component Value Date/Time   WBC 9.9 09/10/2019 1404   RBC 4.34 09/10/2019 1404   HGB 12.4 09/10/2019 1404   HGB 12.4 06/17/2017 1037   HCT 38.4 09/10/2019 1404   HCT 38.6 06/17/2017 1037   PLT 363 09/10/2019 1404   PLT 297 06/17/2017 1037   MCV 88.5 09/10/2019 1404   MCV 90.0 06/17/2017 1037   MCH 28.6 09/10/2019 1404   MCHC 32.3 09/10/2019 1404   RDW 12.1 09/10/2019 1404   RDW 12.5 06/17/2017 1037   LYMPHSABS 2.6 09/10/2019 1404   LYMPHSABS 2.8 06/17/2017 1037   MONOABS 0.6 09/10/2019 1404   MONOABS 0.6 06/17/2017 1037   EOSABS 0.1 09/10/2019 1404   EOSABS 0.1  06/17/2017 1037  BASOSABS 0.0 09/10/2019 1404   BASOSABS 0.0 06/17/2017 1037    CMP     Component Value Date/Time   NA 141 09/10/2019 1404   NA 141 06/17/2017 1037   K 3.9 09/10/2019 1404   K 4.0 06/17/2017 1037   CL 108 09/10/2019 1404   CO2 25 09/10/2019 1404   CO2 26 06/17/2017 1037   GLUCOSE 95 09/10/2019 1404   GLUCOSE 90 06/17/2017 1037   BUN 20 09/10/2019 1404   BUN 12.8 06/17/2017 1037   CREATININE 0.97 09/10/2019 1404   CREATININE 1.0 06/17/2017 1037   CALCIUM 9.0 09/10/2019 1404   CALCIUM 10.0 06/17/2017 1037   PROT 6.9 09/10/2019 1404   PROT 7.2 06/17/2017 1037   ALBUMIN 3.7 09/10/2019 1404   ALBUMIN 4.1 06/17/2017 1037   AST 12 (L) 09/10/2019 1404   AST 16 06/17/2017 1037   ALT 13 09/10/2019 1404   ALT 17 06/17/2017 1037   ALKPHOS 90 09/10/2019 1404   ALKPHOS 89 06/17/2017 1037   BILITOT 0.3 09/10/2019 1404   BILITOT 0.36 06/17/2017 1037   GFRNONAA >60 09/10/2019 1404   GFRAA >60 09/10/2019 1404      ASSESSMENT and THERAPY PLAN:   Malignant neoplasm of upper-outer quadrant of left breast in female, estrogen receptor negative ([facility]) Jacoba is a BRCA51 + 51 year old woman with h/o breast cancer s/p treatment completed in 2015.    She has no sign of breast cancer recurrence.  To further evaluate the rippling, I have ordered MRI breast w/o contrast to r/o implant rupture.    I let her know that I recommended she go to the [facility] for care.  I will find the schedule and we will send it to her.      All questions were answered. The patient knows to call the clinic with any problems, questions or concerns. We can certainly see the patient much sooner if necessary.  Total encounter time: 20 minutes in face to face visit time, chart review, lab review, care coordination, order entry, and documentation of the encounter.  Wilber Bihari, NP 01/13/22 7:03 AM Medical Oncology and Hematology [facility] [place],  [place] [address] Tel. [phone]    Fax. [phone]  *Total Encounter Time as defined by the Centers for Medicare and Medicaid Services includes, in addition to the face-to-face time of a patient visit (documented in the note above) non-face-to-face time: obtaining and reviewing outside history, ordering and reviewing medications, tests or procedures, care coordination (communications with other health care professionals or caregivers) and documentation in the medical record.

## 2022-01-13 ENCOUNTER — Encounter: Payer: Self-pay | Admitting: Adult Health

## 2022-01-13 NOTE — Assessment & Plan Note (Signed)
Tamara [name] is a BRCA15 + 51 year old woman with h/o breast cancer s/p treatment completed in 2015.    She has no sign of breast cancer recurrence.  To further evaluate the rippling, I have ordered MRI breast w/o contrast to r/o implant rupture.    I let her know that I recommended she go to the [facility] for care.  I will find the schedule and we will send it to her.

## 2022-01-20 ENCOUNTER — Other Ambulatory Visit: Payer: Self-pay

## 2022-01-20 ENCOUNTER — Ambulatory Visit (HOSPITAL_COMMUNITY)
Admission: RE | Admit: 2022-01-20 | Discharge: 2022-01-20 | Disposition: A | Discharge status: Home / Self Care | Payer: Self-pay | Source: Ambulatory Visit | Attending: Adult Health | Admitting: Adult Health

## 2022-01-20 DIAGNOSIS — C50412 Malignant neoplasm of upper-outer quadrant of left female breast: Secondary | ICD-10-CM | POA: Insufficient documentation

## 2022-01-20 DIAGNOSIS — Z171 Estrogen receptor negative status [ER-]: Secondary | ICD-10-CM | POA: Insufficient documentation

## 2022-01-22 ENCOUNTER — Telehealth: Payer: Self-pay

## 2022-01-22 NOTE — Telephone Encounter (Signed)
-----   Message from Gardenia Phlegm, NP sent at 01/22/2022  9:08 AM EST ----- Please let the patient know that implants are intact.  Should she continue to have any changes to please let us know.  She should follow-up with her plastic surgeon. ----- Message ----- From: Interface, Rad Results In Sent: 01/22/2022   9:07 AM EST To: Gardenia Phlegm, NP

## 2022-01-22 NOTE — Telephone Encounter (Signed)
Called and spoke with pt, informed her that implants are intact per her scan and to follow up with [place] if she notices any other changes.  Pt verbalized understanding and thanks
